# Patient Record
Sex: Male | Born: 1935
Health system: Southern US, Community
[De-identification: ages and names within clinical notes are randomized; demographics above are authoritative.]

## PROBLEM LIST (undated history)

## (undated) DIAGNOSIS — H269 Unspecified cataract: Secondary | ICD-10-CM

## (undated) DIAGNOSIS — Q059 Spina bifida, unspecified: Secondary | ICD-10-CM

## (undated) DIAGNOSIS — D649 Anemia, unspecified: Secondary | ICD-10-CM

## (undated) DIAGNOSIS — K5732 Diverticulitis of large intestine without perforation or abscess without bleeding: Secondary | ICD-10-CM

## (undated) DIAGNOSIS — I10 Essential (primary) hypertension: Secondary | ICD-10-CM

## (undated) DIAGNOSIS — E785 Hyperlipidemia, unspecified: Secondary | ICD-10-CM

## (undated) DIAGNOSIS — L03317 Cellulitis of buttock: Secondary | ICD-10-CM

## (undated) DIAGNOSIS — I779 Disorder of arteries and arterioles, unspecified: Secondary | ICD-10-CM

## (undated) DIAGNOSIS — E46 Unspecified protein-calorie malnutrition: Secondary | ICD-10-CM

## (undated) DIAGNOSIS — F32A Depression, unspecified: Secondary | ICD-10-CM

## (undated) DIAGNOSIS — I739 Peripheral vascular disease, unspecified: Secondary | ICD-10-CM

## (undated) DIAGNOSIS — F4310 Post-traumatic stress disorder, unspecified: Secondary | ICD-10-CM

## (undated) DIAGNOSIS — M545 Low back pain, unspecified: Secondary | ICD-10-CM

## (undated) DIAGNOSIS — E876 Hypokalemia: Secondary | ICD-10-CM

## (undated) DIAGNOSIS — I2510 Atherosclerotic heart disease of native coronary artery without angina pectoris: Secondary | ICD-10-CM

## (undated) DIAGNOSIS — I219 Acute myocardial infarction, unspecified: Secondary | ICD-10-CM

## (undated) DIAGNOSIS — R0602 Shortness of breath: Secondary | ICD-10-CM

## (undated) DIAGNOSIS — E119 Type 2 diabetes mellitus without complications: Secondary | ICD-10-CM

## (undated) DIAGNOSIS — Z77098 Contact with and (suspected) exposure to other hazardous, chiefly nonmedicinal, chemicals: Secondary | ICD-10-CM

## (undated) DIAGNOSIS — R32 Unspecified urinary incontinence: Secondary | ICD-10-CM

## (undated) DIAGNOSIS — F329 Major depressive disorder, single episode, unspecified: Secondary | ICD-10-CM

## (undated) DIAGNOSIS — G8929 Other chronic pain: Secondary | ICD-10-CM

## (undated) DIAGNOSIS — F419 Anxiety disorder, unspecified: Secondary | ICD-10-CM

## (undated) DIAGNOSIS — N189 Chronic kidney disease, unspecified: Secondary | ICD-10-CM

## (undated) DIAGNOSIS — M1990 Unspecified osteoarthritis, unspecified site: Secondary | ICD-10-CM

## (undated) HISTORY — PX: EYE SURGERY: SHX253

## (undated) HISTORY — DX: Spina bifida, unspecified: Q05.9

## (undated) HISTORY — PX: HEMORRHOID BANDING: SHX5850

## (undated) HISTORY — DX: Peripheral vascular disease, unspecified: I73.9

## (undated) HISTORY — DX: Diverticulitis of large intestine without perforation or abscess without bleeding: K57.32

## (undated) HISTORY — DX: Type 2 diabetes mellitus without complications: E11.9

## (undated) HISTORY — DX: Hypokalemia: E87.6

## (undated) HISTORY — DX: Anemia, unspecified: D64.9

## (undated) HISTORY — PX: PROSTATE SURGERY: SHX751

## (undated) HISTORY — DX: Chronic kidney disease, unspecified: N18.9

## (undated) HISTORY — DX: Hyperlipidemia, unspecified: E78.5

## (undated) HISTORY — DX: Post-traumatic stress disorder, unspecified: F43.10

## (undated) HISTORY — DX: Essential (primary) hypertension: I10

## (undated) HISTORY — DX: Disorder of arteries and arterioles, unspecified: I77.9

## (undated) HISTORY — DX: Unspecified protein-calorie malnutrition: E46

## (undated) HISTORY — PX: CATARACT EXTRACTION W/ INTRAOCULAR LENS  IMPLANT, BILATERAL: SHX1307

## (undated) HISTORY — DX: Unspecified cataract: H26.9

## (undated) HISTORY — DX: Atherosclerotic heart disease of native coronary artery without angina pectoris: I25.10

---
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
## 1962-02-24 HISTORY — PX: KNEE SURGERY: SHX244

## 1978-10-26 HISTORY — PX: CARPAL TUNNEL RELEASE: SHX101

## 1996-02-25 HISTORY — PX: CORONARY ARTERY BYPASS GRAFT: SHX141

## 1996-02-25 HISTORY — PX: CARDIAC CATHETERIZATION: SHX172

## 1997-11-23 ENCOUNTER — Observation Stay (HOSPITAL_COMMUNITY): Admission: RE | Admit: 1997-11-23 | Discharge: 1997-11-24 | Payer: Self-pay | Admitting: Cardiology

## 1998-10-26 HISTORY — PX: CYSTECTOMY: SUR359

## 2000-06-11 ENCOUNTER — Ambulatory Visit (HOSPITAL_COMMUNITY): Admission: RE | Admit: 2000-06-11 | Discharge: 2000-06-11 | Payer: Self-pay | Admitting: Family Medicine

## 2000-06-11 ENCOUNTER — Encounter: Payer: Self-pay | Admitting: Family Medicine

## 2000-10-19 ENCOUNTER — Encounter (INDEPENDENT_AMBULATORY_CARE_PROVIDER_SITE_OTHER): Payer: Self-pay | Admitting: Specialist

## 2000-10-19 ENCOUNTER — Inpatient Hospital Stay (HOSPITAL_COMMUNITY): Admission: RE | Admit: 2000-10-19 | Discharge: 2000-10-21 | Payer: Self-pay | Admitting: Urology

## 2001-06-14 ENCOUNTER — Encounter: Admission: RE | Admit: 2001-06-14 | Discharge: 2001-06-14 | Payer: Self-pay | Admitting: Neurology

## 2001-06-14 ENCOUNTER — Encounter: Payer: Self-pay | Admitting: Neurology

## 2001-08-30 ENCOUNTER — Encounter: Admission: RE | Admit: 2001-08-30 | Discharge: 2001-09-23 | Payer: Self-pay | Admitting: Specialist

## 2002-04-15 ENCOUNTER — Encounter (HOSPITAL_COMMUNITY): Admission: RE | Admit: 2002-04-15 | Discharge: 2002-05-15 | Payer: Self-pay | Admitting: *Deleted

## 2002-04-15 ENCOUNTER — Encounter: Payer: Self-pay | Admitting: *Deleted

## 2002-06-21 ENCOUNTER — Encounter: Payer: Self-pay | Admitting: Family Medicine

## 2002-06-21 ENCOUNTER — Ambulatory Visit (HOSPITAL_COMMUNITY): Admission: RE | Admit: 2002-06-21 | Discharge: 2002-06-21 | Payer: Self-pay | Admitting: Family Medicine

## 2002-10-06 ENCOUNTER — Emergency Department (HOSPITAL_COMMUNITY): Admission: EM | Admit: 2002-10-06 | Discharge: 2002-10-06 | Payer: Self-pay | Admitting: Emergency Medicine

## 2002-10-06 ENCOUNTER — Encounter: Payer: Self-pay | Admitting: Emergency Medicine

## 2003-01-05 ENCOUNTER — Encounter (HOSPITAL_COMMUNITY): Admission: RE | Admit: 2003-01-05 | Discharge: 2003-02-04 | Payer: Self-pay | Admitting: Family Medicine

## 2003-05-05 ENCOUNTER — Ambulatory Visit (HOSPITAL_COMMUNITY): Admission: RE | Admit: 2003-05-05 | Discharge: 2003-05-05 | Payer: Self-pay | Admitting: Internal Medicine

## 2004-02-28 ENCOUNTER — Ambulatory Visit: Payer: Self-pay | Admitting: Cardiology

## 2005-03-26 ENCOUNTER — Ambulatory Visit: Payer: Self-pay | Admitting: Cardiology

## 2005-04-11 ENCOUNTER — Ambulatory Visit: Payer: Self-pay

## 2006-02-24 DIAGNOSIS — K5732 Diverticulitis of large intestine without perforation or abscess without bleeding: Secondary | ICD-10-CM

## 2006-02-24 HISTORY — DX: Diverticulitis of large intestine without perforation or abscess without bleeding: K57.32

## 2006-03-02 ENCOUNTER — Ambulatory Visit: Payer: Self-pay | Admitting: Cardiology

## 2006-03-23 ENCOUNTER — Inpatient Hospital Stay (HOSPITAL_COMMUNITY): Admission: EM | Admit: 2006-03-23 | Discharge: 2006-03-26 | Payer: Self-pay | Admitting: Emergency Medicine

## 2006-04-16 ENCOUNTER — Ambulatory Visit: Payer: Self-pay

## 2006-10-23 ENCOUNTER — Ambulatory Visit (HOSPITAL_BASED_OUTPATIENT_CLINIC_OR_DEPARTMENT_OTHER): Admission: RE | Admit: 2006-10-23 | Discharge: 2006-10-23 | Payer: Self-pay | Admitting: Orthopedic Surgery

## 2006-10-23 ENCOUNTER — Encounter: Admission: RE | Admit: 2006-10-23 | Discharge: 2006-10-23 | Payer: Self-pay | Admitting: Orthopedic Surgery

## 2006-10-30 ENCOUNTER — Inpatient Hospital Stay (HOSPITAL_COMMUNITY): Admission: EM | Admit: 2006-10-30 | Discharge: 2006-11-11 | Payer: Self-pay | Admitting: Emergency Medicine

## 2006-11-06 ENCOUNTER — Ambulatory Visit: Payer: Self-pay | Admitting: Internal Medicine

## 2006-12-18 ENCOUNTER — Ambulatory Visit: Payer: Self-pay | Admitting: Gastroenterology

## 2006-12-21 ENCOUNTER — Ambulatory Visit (HOSPITAL_COMMUNITY): Admission: RE | Admit: 2006-12-21 | Discharge: 2006-12-21 | Payer: Self-pay | Admitting: Internal Medicine

## 2006-12-29 ENCOUNTER — Ambulatory Visit: Payer: Self-pay | Admitting: Internal Medicine

## 2007-01-06 ENCOUNTER — Encounter: Payer: Self-pay | Admitting: Internal Medicine

## 2007-01-06 ENCOUNTER — Ambulatory Visit (HOSPITAL_COMMUNITY): Admission: RE | Admit: 2007-01-06 | Discharge: 2007-01-06 | Payer: Self-pay | Admitting: Internal Medicine

## 2007-01-06 ENCOUNTER — Encounter (HOSPITAL_BASED_OUTPATIENT_CLINIC_OR_DEPARTMENT_OTHER): Admission: RE | Admit: 2007-01-06 | Discharge: 2007-03-02 | Payer: Self-pay | Admitting: Surgery

## 2007-01-06 ENCOUNTER — Ambulatory Visit: Payer: Self-pay | Admitting: Internal Medicine

## 2007-03-03 ENCOUNTER — Encounter (HOSPITAL_BASED_OUTPATIENT_CLINIC_OR_DEPARTMENT_OTHER): Admission: RE | Admit: 2007-03-03 | Discharge: 2007-06-01 | Payer: Self-pay | Admitting: Surgery

## 2007-03-09 ENCOUNTER — Ambulatory Visit: Payer: Self-pay | Admitting: Cardiology

## 2007-03-24 ENCOUNTER — Ambulatory Visit: Payer: Self-pay

## 2007-03-24 LAB — CONVERTED CEMR LAB
ALT: 15 units/L (ref 0–53)
AST: 22 units/L (ref 0–37)
Alkaline Phosphatase: 47 units/L (ref 39–117)
Bilirubin, Direct: 0.1 mg/dL (ref 0.0–0.3)
Chloride: 106 meq/L (ref 96–112)
GFR calc Af Amer: 59 mL/min
Potassium: 4 meq/L (ref 3.5–5.1)
Sodium: 139 meq/L (ref 135–145)
Total CHOL/HDL Ratio: 3.1
Total Protein: 6.4 g/dL (ref 6.0–8.3)

## 2007-06-11 ENCOUNTER — Encounter (HOSPITAL_BASED_OUTPATIENT_CLINIC_OR_DEPARTMENT_OTHER): Admission: RE | Admit: 2007-06-11 | Discharge: 2007-06-28 | Payer: Self-pay | Admitting: Surgery

## 2007-09-17 ENCOUNTER — Ambulatory Visit (HOSPITAL_BASED_OUTPATIENT_CLINIC_OR_DEPARTMENT_OTHER): Admission: RE | Admit: 2007-09-17 | Discharge: 2007-09-17 | Payer: Self-pay | Admitting: Orthopedic Surgery

## 2008-05-04 ENCOUNTER — Encounter: Payer: Self-pay | Admitting: Cardiology

## 2008-05-08 ENCOUNTER — Ambulatory Visit: Payer: Self-pay

## 2008-05-08 ENCOUNTER — Encounter: Payer: Self-pay | Admitting: Cardiology

## 2008-05-08 ENCOUNTER — Ambulatory Visit: Payer: Self-pay | Admitting: Cardiology

## 2008-08-01 ENCOUNTER — Encounter: Admission: RE | Admit: 2008-08-01 | Discharge: 2008-10-30 | Payer: Self-pay | Admitting: Nurse Practitioner

## 2008-09-21 ENCOUNTER — Encounter: Payer: Self-pay | Admitting: Cardiology

## 2008-10-05 ENCOUNTER — Encounter: Payer: Self-pay | Admitting: Cardiology

## 2008-10-27 ENCOUNTER — Encounter: Payer: Self-pay | Admitting: Cardiology

## 2008-10-29 DIAGNOSIS — I10 Essential (primary) hypertension: Secondary | ICD-10-CM | POA: Insufficient documentation

## 2008-10-29 DIAGNOSIS — E46 Unspecified protein-calorie malnutrition: Secondary | ICD-10-CM

## 2008-10-29 DIAGNOSIS — E1121 Type 2 diabetes mellitus with diabetic nephropathy: Secondary | ICD-10-CM

## 2008-10-29 DIAGNOSIS — N184 Chronic kidney disease, stage 4 (severe): Secondary | ICD-10-CM | POA: Insufficient documentation

## 2008-10-29 DIAGNOSIS — N183 Chronic kidney disease, stage 3 (moderate): Secondary | ICD-10-CM

## 2008-10-29 DIAGNOSIS — F4310 Post-traumatic stress disorder, unspecified: Secondary | ICD-10-CM

## 2008-10-29 DIAGNOSIS — E876 Hypokalemia: Secondary | ICD-10-CM

## 2008-10-29 DIAGNOSIS — D649 Anemia, unspecified: Secondary | ICD-10-CM | POA: Insufficient documentation

## 2008-10-29 DIAGNOSIS — E1122 Type 2 diabetes mellitus with diabetic chronic kidney disease: Secondary | ICD-10-CM

## 2008-10-30 IMAGING — CR DG CHEST 2V
2 series · 2 of 2 positions shown · non-contrast
Comparison: 10/23/06.

CLINICAL DATA: 75 year-old, sugar low, possible pneumonia. Current kidney infection. History of diabetes, hypertension, and bypass in 6333. Fever.
CHEST - 2 VIEW:

[w chest pa]
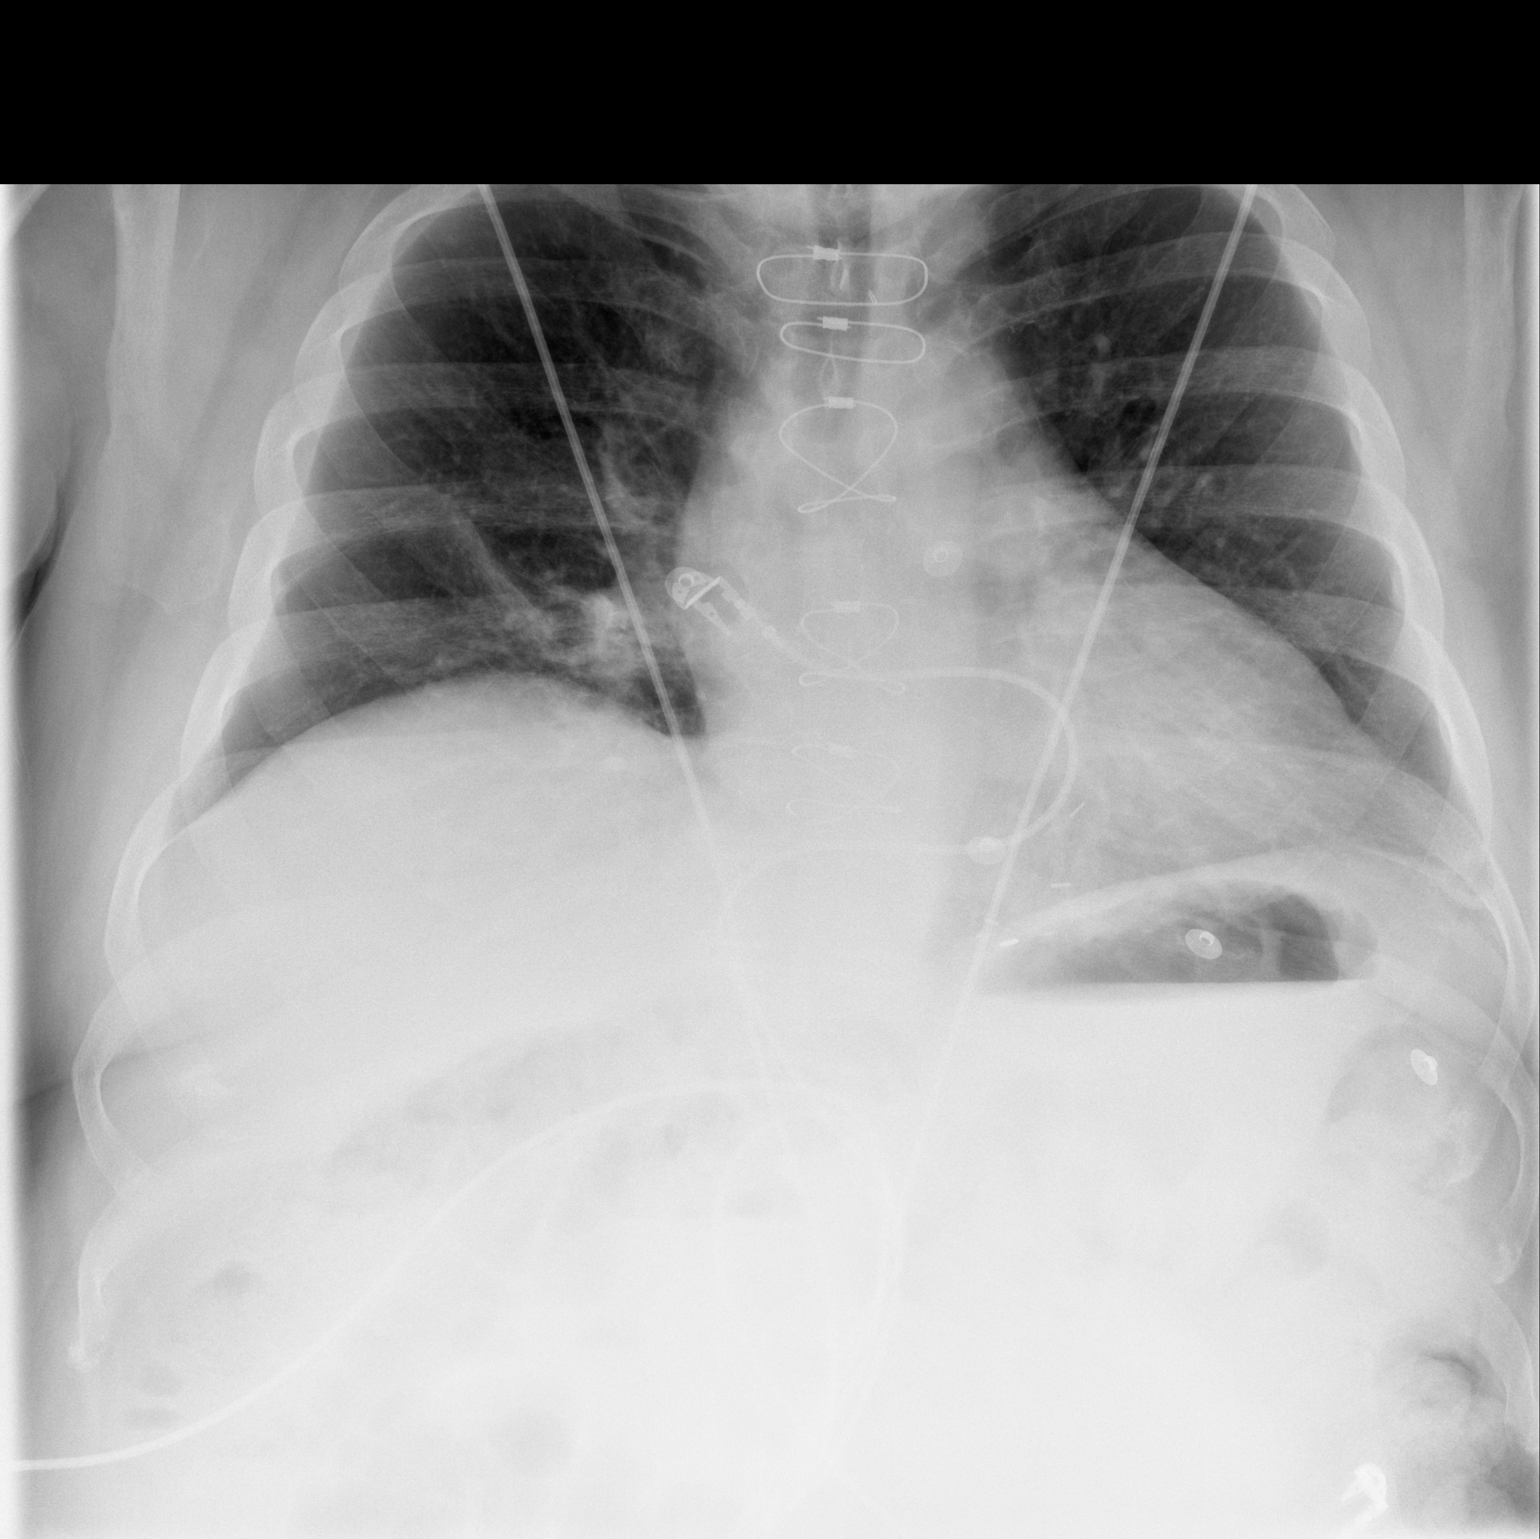

[w chest lat]
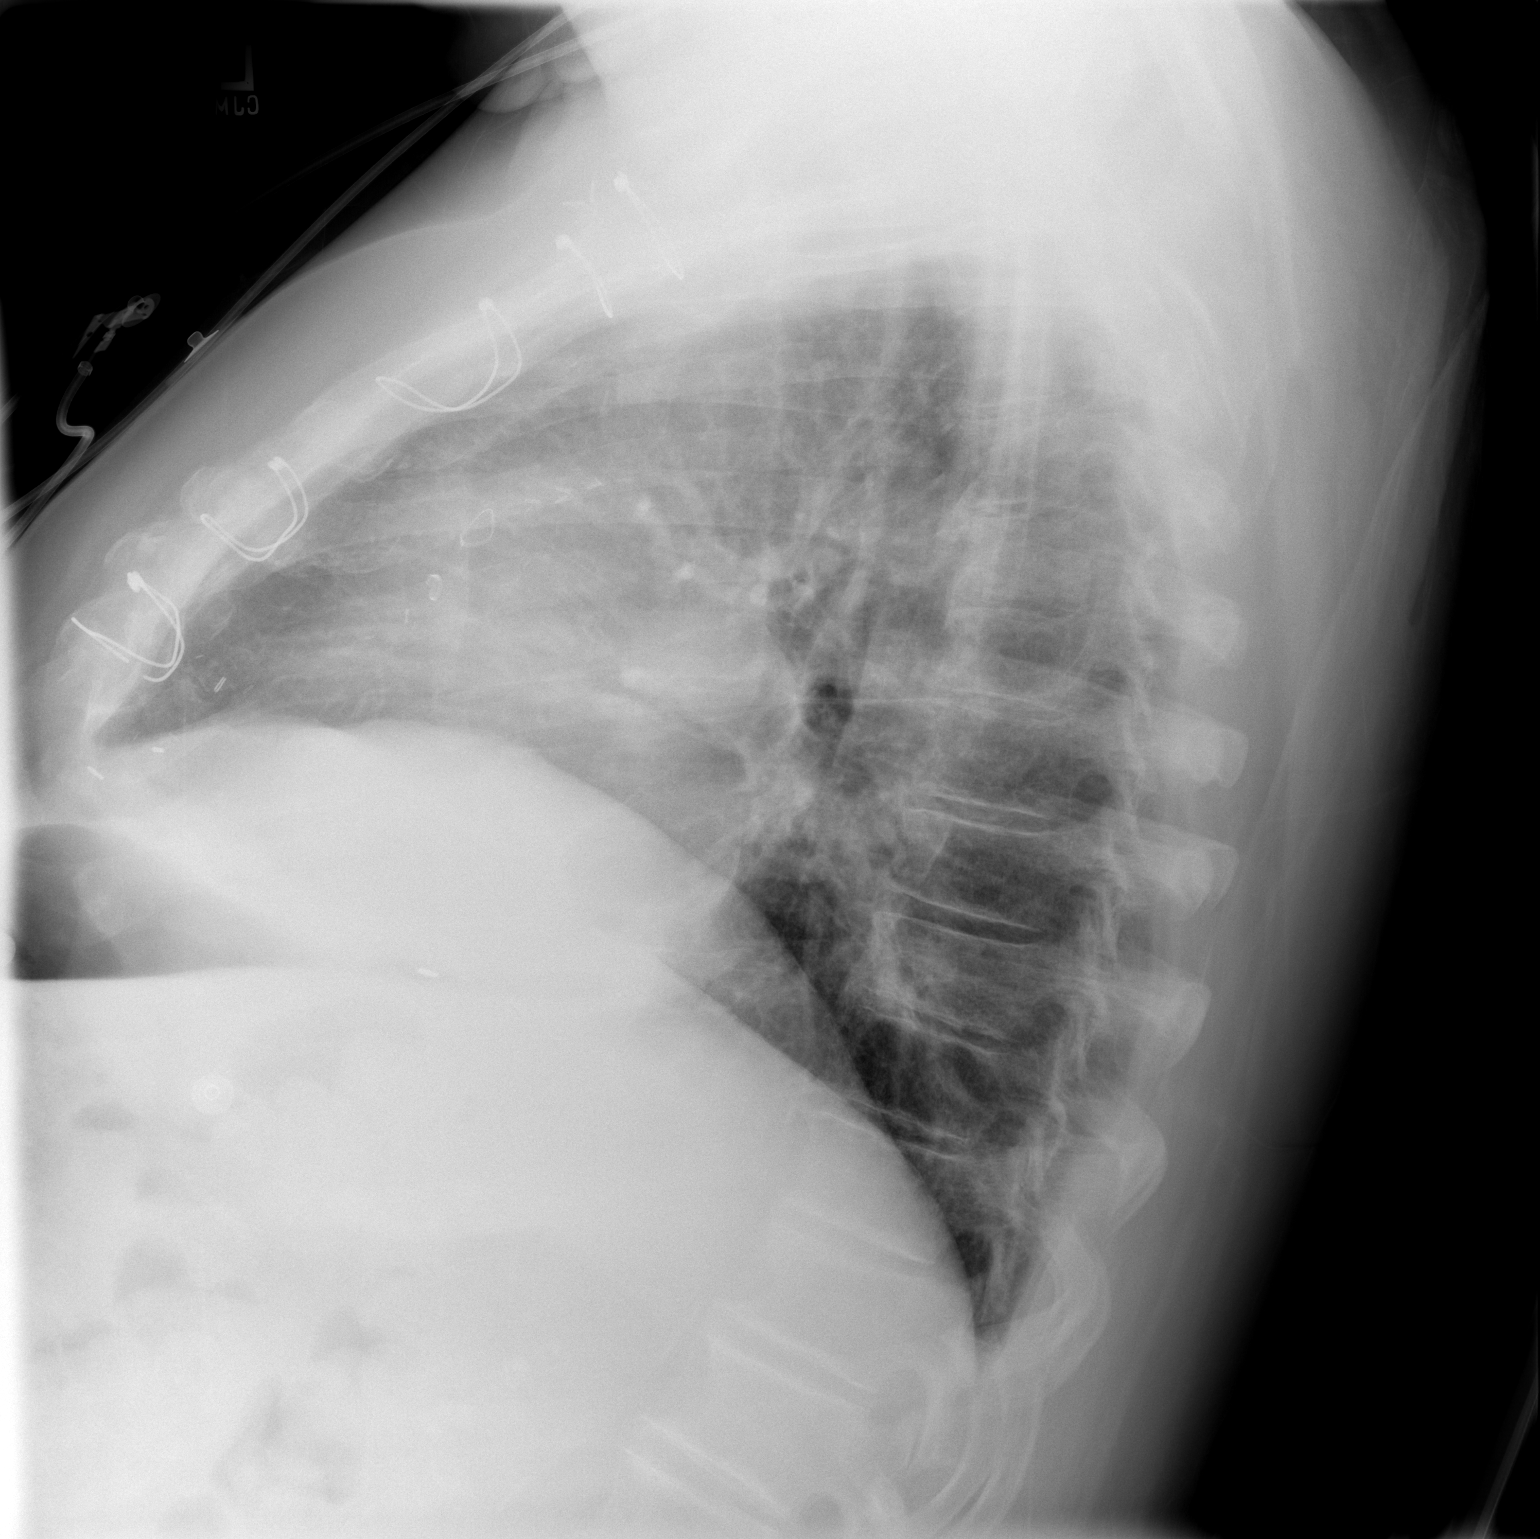

[2 of 2 positions shown; findings below may reference images not displayed]

FINDINGS: The patient has had prior median sternotomy and CABG.  The heart is mildly enlarged, but there is no pulmonary edema.  Minimal bibasilar atelectasis is noted.  Postoperative changes are seen in the region of the GE junction. No focal consolidations or pleural effusions are identified.
Cardiomegaly without evidence for acute pulmonary abnormality. A mass is seen in the right lower lobe measuring 1.9 x 2.3 cm, suspicious for bronchogenic carcinoma.  There are marked emphysematous changes bilaterally.  Postsurgical changes are seen in the left hilar region. There is a mediastinal hilar or axillary adenopathy. There is atherosclerotic calcification of the thoracic aorta, associated with significant intimal plaque.  Degenerative changes are seen in the spine.
Images of the upper abdomen are unremarkable.
IMPRESSION: 1.  There is no evidence for acute pulmonary embolus.
2.  Right lower lobe mass in the right infrahilar region.
3.  Emphysematous changes and postoperative changes left lung
Findings were called to Dr. Ni in the Emergency Department.

## 2008-11-01 ENCOUNTER — Encounter (INDEPENDENT_AMBULATORY_CARE_PROVIDER_SITE_OTHER): Payer: Self-pay | Admitting: *Deleted

## 2008-11-01 ENCOUNTER — Ambulatory Visit: Payer: Self-pay | Admitting: Cardiology

## 2008-12-22 ENCOUNTER — Encounter (INDEPENDENT_AMBULATORY_CARE_PROVIDER_SITE_OTHER): Payer: Self-pay | Admitting: *Deleted

## 2009-03-08 DIAGNOSIS — I2510 Atherosclerotic heart disease of native coronary artery without angina pectoris: Secondary | ICD-10-CM | POA: Insufficient documentation

## 2009-03-29 ENCOUNTER — Ambulatory Visit: Payer: Self-pay | Admitting: Cardiology

## 2009-04-04 ENCOUNTER — Encounter: Payer: Self-pay | Admitting: Cardiology

## 2009-05-18 ENCOUNTER — Encounter: Payer: Self-pay | Admitting: Cardiology

## 2009-05-22 ENCOUNTER — Encounter: Payer: Self-pay | Admitting: Cardiology

## 2009-05-22 ENCOUNTER — Ambulatory Visit: Payer: Self-pay

## 2009-09-10 ENCOUNTER — Encounter: Payer: Self-pay | Admitting: Cardiology

## 2009-10-31 ENCOUNTER — Ambulatory Visit: Payer: Self-pay | Admitting: Cardiology

## 2009-11-07 ENCOUNTER — Encounter: Payer: Self-pay | Admitting: Cardiology

## 2010-02-15 ENCOUNTER — Encounter: Payer: Self-pay | Admitting: Cardiology

## 2010-03-28 NOTE — Miscellaneous (Signed)
Summary: Orders Update  Clinical Lists Changes  Orders: Added new Test order of Carotid Duplex (Carotid Duplex) - Signed 

## 2010-03-28 NOTE — Assessment & Plan Note (Signed)
Summary: CAD/ANAS   Visit Type:  rov Primary Provider:  Redge Gainer  CC:  no cp..pt c/o some sob when walking..edema/ankles.  History of Present Illness: [name] returns today for followup evaluation and management his coronary disease.  I saw him recently back in the fall for preoperative clearance. They have not side operated yet.  His back is his limiting factor this point. He walks with a walker. He has not fallen since I last saw him.  He denies any rest angina or nocturnal angina. If he pushes himself extra hard on his walker he will get a little tightness in his chest. It does not radiate. When he is doing his normal pace he has no angina.  Clinical Reports Reviewed:  Nuclear Study:  04/16/2006:  Excerise capacity: Adenosine study with no exercise  Blood Pressure response:  Clinical symptoms:  ECG impression: Baseline: NSR; . No significant ST segement change with adenosine  Overall impression: Low risk stress nuclear study. There is a minimal reversible defect in the inferolateral [name] suggestive of ischemia  Glori Bickers, MD   04/11/2005:  Excerise capacity:  Blood Pressure response:  Clinical symptoms:  ECG impression: No diagnostic St or T wave changes to suggest ischemia  Overall impression: Inferolateral changes consistent with probable soft tissue attenuation (diaphragm/mycardium). Cannot exclude mild basilar ischemia. Mild apical thinning with minimal improvement, not significant for ischemia. Otherwise normal perfusion. LVEF=63%. Overall low risk scan.  Dorris Carnes, MD   Current Medications (verified): 1)  Doxycycline Hyclate 100 Mg Caps (Doxycycline Hyclate) .Marland Kitchen.. 1 Tab Once Daily 2)  Glucotrol Xl 10 Mg Xr24h-Tab (Glipizide) .Marland Kitchen.. 1 Tab Once Daily 3)  Diovan 80 Mg Tabs (Valsartan) .Marland Kitchen.. 1 Tab Qam 4)  Wellbutrin Xl 300 Mg Xr24h-Tab (Bupropion Hcl) .Marland Kitchen.. 1 Tab Qam 5)  Vytorin 10-20 Mg Tabs (Ezetimibe-Simvastatin) .Marland Kitchen.. 1 Tab At Bedtime 6)   Nitroglycerin 0.4 Mg Subl (Nitroglycerin) .... One Tablet Under Tongue Every 5 Minutes As Needed For Chest Pain---May Repeat Times Three 7)  Aspirin 81 Mg Tbec (Aspirin) .... Take One Tablet By Mouth Daily 8)  Vitamin C 1000 Mg Tabs (Ascorbic Acid) .Marland Kitchen.. 1 Tab Once Daily 9)  Vitamin D 5000 Iu .Marland Kitchen.. 1 Tab Once Daily 10)  Co Q-10 100 Mg Caps (Coenzyme Q10) .Marland Kitchen.. 1 Cap Two Times A Day 11)  Actos 45 Mg Tabs (Pioglitazone Hcl) .Marland Kitchen.. 1 Tab Once Daily 12)  Cipro 500 Mg Tabs (Ciprofloxacin Hcl) .Marland Kitchen.. 1 Tab Two Times A Day X 10 Days 13)  Lutein 40 Mg Caps (Lutein) .Marland Kitchen.. 1 Cap Once Daily 14)  Fish Oil 1000 Mg Caps (Omega-3 Fatty Acids) .Marland Kitchen.. 1 Cap Two Times A Day  Allergies: 1)  ! Sulfa 2)  ! Macrodantin  Past History:  Past Medical History: Last updated: 03/08/2009 CAD, NATIVE VESSEL (ICD-414.01) DIABETES MELLITUS, TYPE II (ICD-250.00) HYPERLIPIDEMIA (ICD-272.4) HYPERTENSION, UNSPECIFIED (ICD-401.9) RENAL INSUFFICIENCY, CHRONIC (ICD-585.9) CAROTID ARTERY DISEASE/NONOBSTRUCTIVE (ICD-433.10) ANEMIA (ICD-285.9) HYPOKALEMIA (ICD-276.8) MALNUTRITION, PROTEIN-CALORIE (ICD-263.9) AGENT ORANGE SYNDROME/ HX OF (ICD-E997.2) PTSD (ICD-309.81)    Past Surgical History: Last updated: 05/05/2008 Spina bifida surgery Left [place]ne[place] surgery CABG 1998?  Family History: Last updated: 05/05/2008 Non contributory  Social History: Last updated: 05/05/2008 Retired  Married  Tobacco Use - Yes.  Alcohol Use - no Drug Use - no  Risk Factors: Smoking Status: current (05/05/2008)  Review of Systems       nother than history of present illness  Vital Signs:  Patient profile:   77 year old male Height:  69 inches Weight:      246 pounds BMI:     36.46 Pulse rate:   93 / minute Pulse rhythm:   irregular BP sitting:   124 / 58  (left arm) Cuff size:   large  Vitals Entered By: Julaine Hua, CMA (March 29, 2009 10:05 AM)  Physical Exam  General:  obese.  obese.   Head:  normocephalic and  atraumatic Eyes:  PERRLA/EOM intact; conjunctiva and lids normal. Neck:  Neck supple, no JVD. No masses, thyromegaly or abnormal cervical nodes. Lungs:  Clear bilaterally to auscultation and percussion. Heart:  Non-displaced PMI, chest non-tender; regular rate and rhythm, S1, S2 without murmurs, rubs or gallops. Carotid upstroke normal, no bruit. Normal abdominal aortic size, no bruits. Femorals normal pulses, no bruits. Pedals normal pulses. No edema, no varicosities. Msk:  decreased ROM.   Pulses:  diminished but present the lower extremities. Extremities:  the sinus sores or ulcerations. Decrease capillary reflex.1+ left pedal edema and 1+ right pedal edema.   Neurologic:  Alert and oriented x 3. Skin:  Intact without lesions or rashes. Psych:  Normal affect.   EKG  Procedure date:  03/29/2009  Findings:      normal sinus rhythm, left axis deviation, poor R wave progression across the anterior precordium, unchanged since previous EKG.  Impression & Recommendations:  Problem # 1:  CAD, NATIVE VESSEL (ICD-414.01) Assessment Unchanged he That has stable over exertional angina. He is having no angina with the usual activity on his walker and shortened no nocturnal or rest angina. I was still clear him for surgery at a low operative risk. I will see him back in 6 months. We have added several nitroglycerin to his regimen. His updated medication list for this problem includes:    Nitroglycerin 0.4 Mg Subl (Nitroglycerin) ..... One tablet under tongue every 5 minutes as needed for chest pain---may repeat times three    Aspirin 81 Mg Tbec (Aspirin) .Marland Kitchen... Take one tablet by mouth daily  Orders: EKG w/ Interpretation (93000)  Patient Instructions: 1)  Your physician recommends that you schedule a follow-up appointment in: 6MONTHS WITH DR Ludella Pranger 2)  Your physician recommends that you continue on your current medications as directed. Please refer to the Current Medication list given to you  today. Prescriptions: NITROGLYCERIN 0.4 MG SUBL (NITROGLYCERIN) One tablet under tongue every 5 minutes as needed for chest pain---may repeat times three  #25 x 9   Entered by:   Julaine Hua, CMA   Authorized by:   Renella Cunas, MD, [facility]   Signed by:   Julaine Hua, CMA on 03/29/2009   Method used:   Electronically to        [id number]       [address]       [place], [place]  [address]       Ph: [phone] or [phone]       Fax: [phone]   RxID:   [id number]

## 2010-03-28 NOTE — Assessment & Plan Note (Signed)
Summary: 6 mo f/u ./cy   Visit Type:  6 mo f/u Primary Provider:  Redge Gainer  CC:  no cardiac complaints today.  History of Present Illness: [name] comes in today for management of his cardiac and vascular disease.  He's having no angina ischemic symptoms. His mobility is severely reduced and is on a walker.  Denies any symptoms of TIAs or mini stroke. Carotid Dopplers were stable in March.  He brings blood work from Dr. Laurance Flatten which shows his lipids to be a gold as well as hemoglobin A1c. I reviewed these numbers with him today. His creatinine is stable at 1.56.  Denies any orthopnea, PND or increased edema. No syncope or palpitations either.  Current Medications (verified): 1)  Doxycycline Hyclate 100 Mg Caps (Doxycycline Hyclate) .Marland Kitchen.. 1 Tab Once Daily 2)  Glucotrol Xl 10 Mg Xr24h-Tab (Glipizide) .Marland Kitchen.. 1 Tab Once Daily 3)  Diovan 80 Mg Tabs (Valsartan) .Marland Kitchen.. 1 Tab Qam 4)  Wellbutrin Xl 300 Mg Xr24h-Tab (Bupropion Hcl) .Marland Kitchen.. 1 Tab Qam 5)  Vytorin 10-20 Mg Tabs (Ezetimibe-Simvastatin) .Marland Kitchen.. 1 Tab At Bedtime 6)  Nitroglycerin 0.4 Mg Subl (Nitroglycerin) .... One Tablet Under Tongue Every 5 Minutes As Needed For Chest Pain---May Repeat Times Three 7)  Aspirin 81 Mg Tbec (Aspirin) .... Take One Tablet By Mouth Daily 8)  Vitamin C 1000 Mg Tabs (Ascorbic Acid) .Marland Kitchen.. 1 Tab Once Daily 9)  Vitamin D 5000 Iu .Marland Kitchen.. 1 Tab Once Daily 10)  Co Q-10 100 Mg Caps (Coenzyme Q10) .Marland Kitchen.. 1 Cap Two Times A Day 11)  Lutein 40 Mg Caps (Lutein) .Marland Kitchen.. 1 Cap Once Daily 12)  Fish Oil 1000 Mg Caps (Omega-3 Fatty Acids) .Marland Kitchen.. 1 Cap Two Times A Day  Allergies: 1)  ! Sulfa 2)  ! Macrodantin  Past History:  Past Medical History: Last updated: 03/08/2009 CAD, NATIVE VESSEL (ICD-414.01) DIABETES MELLITUS, TYPE II (ICD-250.00) HYPERLIPIDEMIA (ICD-272.4) HYPERTENSION, UNSPECIFIED (ICD-401.9) RENAL INSUFFICIENCY, CHRONIC (ICD-585.9) CAROTID ARTERY DISEASE/NONOBSTRUCTIVE (ICD-433.10) ANEMIA  (ICD-285.9) HYPOKALEMIA (ICD-276.8) MALNUTRITION, PROTEIN-CALORIE (ICD-263.9) AGENT ORANGE SYNDROME/ HX OF (ICD-E997.2) PTSD (ICD-309.81)    Past Surgical History: Last updated: 05/05/2008 Spina bifida surgery Left [place]ne[place] surgery CABG 1998?  Family History: Last updated: 05/05/2008 Non contributory  Social History: Last updated: 05/05/2008 Retired  Married  Tobacco Use - Yes.  Alcohol Use - no Drug Use - no  Risk Factors: Smoking Status: current (05/05/2008)  Review of Systems       negative other than history of present illness  Vital Signs:  Patient profile:   77 year old male Height:      69 inches Weight:      244.12 pounds BMI:     36.18 Pulse rate:   88 / minute Pulse rhythm:   regular BP sitting:   142 / 76  (left arm) Cuff size:   large  Vitals Entered By: Julaine Hua, CMA (October 31, 2009 10:24 AM)  Physical Exam  General:  obese.  obese.   Head:  normocephalic and atraumatic Eyes:  PERRLA/EOM intact; conjunctiva and lids normal. Neck:  Neck supple, no JVD. No masses, thyromegaly or abnormal cervical nodes. Lungs:  Clear bilaterally to auscultation and percussion. Heart:  PMI poorly appreciated, soft S1-S2, no gallop. Carotid upstrokes equal bilaterally with bilateral bruits left greater than right Msk:  decreased ROM.  using a walkerdecreased ROM.   Pulses:  present but reduced in the lower extremities, tissue perfusion adequate Extremities:  trace left pedal edema and trace right pedal edema.  trace left pedal edema.   Neurologic:  Alert and oriented x 3. Skin:  Intact without lesions or rashes. Psych:  Normal affect.   Impression & Recommendations:  Problem # 1:  CAD, NATIVE VESSEL (ICD-414.01) Assessment Unchanged  His updated medication list for this problem includes:    Nitroglycerin 0.4 Mg Subl (Nitroglycerin) ..... One tablet under tongue every 5 minutes as needed for chest pain---may repeat times three    Aspirin 81 Mg Tbec  (Aspirin) .Marland Kitchen... Take one tablet by mouth daily  Problem # 2:  DIABETES MELLITUS, TYPE II (ICD-250.00) Assessment: Improved  The following medications were removed from the medication list:    Actos 45 Mg Tabs (Pioglitazone hcl) .Marland Kitchen... 1 tab once daily His updated medication list for this problem includes:    Glucotrol Xl 10 Mg Xr24h-tab (Glipizide) .Marland Kitchen... 1 tab once daily    Diovan 160 Mg Tabs (Valsartan) .Marland Kitchen... Take 1 tablet daily    Aspirin 81 Mg Tbec (Aspirin) .Marland Kitchen... Take one tablet by mouth daily  Problem # 3:  HYPERLIPIDEMIA (ICD-272.4) Assessment: Improved  His updated medication list for this problem includes:    Vytorin 10-20 Mg Tabs (Ezetimibe-simvastatin) .Marland Kitchen... 1 tab at bedtime  Problem # 4:  HYPERTENSION, UNSPECIFIED (ICD-401.9)  His updated medication list for this problem includes:    Diovan 160 Mg Tabs (Valsartan) .Marland Kitchen... Take 1 tablet daily    Aspirin 81 Mg Tbec (Aspirin) .Marland Kitchen... Take one tablet by mouth daily  Problem # 5:  RENAL INSUFFICIENCY, CHRONIC (ICD-585.9) Assessment: Unchanged  Problem # 6:  CAROTID ARTERY DISEASE/NONOBSTRUCTIVE (ICD-433.10) Assessment: Unchanged  His updated medication list for this problem includes:    Aspirin 81 Mg Tbec (Aspirin) .Marland Kitchen... Take one tablet by mouth daily  Clinical Reports Reviewed:  EKG:  03/29/2009: EKG Interpretation:  normal sinus rhythm, left axis deviation, poor R wave progression across the anterior precordium, unchanged since previous EKG.  11/01/2008: EKG Interpretation:  normal sinus rhythm, left X. deviation, poor R. progression with low voltage in the anterior lateral precordium, no change  Carotid Doppler:  05/22/2009:  Impressions: Stable, mild carotid artery disease on the right. Progression of disease on the left. 0-39% RICA stenosis. A999333 LICA stenosis.  f/u 6 months.  Sherren Mocha, MD  03/24/2007:  Impression Stable carotid disease, bilaterally. 40-59% bilateral ICA stenosis  04/16/2006:   Impressions Stable carotid disease,  bilaterally 40-59% bilateral ICA stenosis  Nuclear Study:  04/16/2006:  Excerise capacity: Adenosine study with no exercise  Blood Pressure response:  Clinical symptoms:  ECG impression: Baseline: NSR; . No significant ST segement change with adenosine  Overall impression: Low risk stress nuclear study. There is a minimal reversible defect in the inferolateral wall suggestive of ischemia  Glori Bickers, MD   04/11/2005:  Excerise capacity:  Blood Pressure response:  Clinical symptoms:  ECG impression: No diagnostic St or T wave changes to suggest ischemia  Overall impression: Inferolateral changes consistent with probable soft tissue attenuation (diaphragm/mycardium). Cannot exclude mild basilar ischemia. Mild apical thinning with minimal improvement, not significant for ischemia. Otherwise normal perfusion. LVEF=63%. Overall low risk scan.  Dorris Carnes, MD   Patient Instructions: 1)  Your physician recommends that you schedule a follow-up appointment in: 6 months with Dr. Verl Blalock  ( March) 2)  Your physician has recommended you make the following change in your medication:  3)  Your physician has requested that you regularly monitor and record your blood pressure readings at home.  Please use the same machine at the same  time of day to check your readings and record them to bring to your follow-up visit. 4)  Your physician recommends that you have a bmet drawn in 2 weeks with your PCP Dr. Laurance Flatten. 5)  Your physician has requested that you have a carotid duplex in March 2012 Prescriptions: DIOVAN 160 MG TABS (VALSARTAN) Take 1 tablet daily  #90 x 3   Entered by:   Joelyn Oms RN   Authorized by:   Renella Cunas, MD, [facility]   Signed by:   Joelyn Oms RN on 10/31/2009   Method used:   Print then Give to Patient   RxID:   [id number]

## 2010-05-10 ENCOUNTER — Encounter: Payer: Self-pay | Admitting: Cardiology

## 2010-05-10 ENCOUNTER — Ambulatory Visit (INDEPENDENT_AMBULATORY_CARE_PROVIDER_SITE_OTHER): Payer: Medicare Other | Admitting: Cardiology

## 2010-05-10 ENCOUNTER — Telehealth: Payer: Self-pay | Admitting: Cardiology

## 2010-05-10 DIAGNOSIS — I2510 Atherosclerotic heart disease of native coronary artery without angina pectoris: Secondary | ICD-10-CM

## 2010-05-10 DIAGNOSIS — I10 Essential (primary) hypertension: Secondary | ICD-10-CM

## 2010-05-14 NOTE — Progress Notes (Signed)
  Phone Note Outgoing Call   Call placed by: Joelyn Oms RN,  May 10, 2010 10:27 AM Call placed to: Patient Summary of Call: refill med  Follow-up for Phone Call        [facility] [place]  They do have the  Diovan in stock.  Refill for 90/3 given Follow-up by: Joelyn Oms RN,  May 10, 2010 10:29 AM

## 2010-05-14 NOTE — Assessment & Plan Note (Signed)
Summary: FOLLOW UP - 6 MONTHS/per pt call=mj   Visit Type:  6 mo follow up Primary Provider:  Redge Gainer  CC:  feels a thump in his chest once in a while. disturbs his whole body when this happens..  History of Present Illness: Mr [name] returns today for E and M of his Cardiac and Vascular disease. He is relatively immobile but denies angina or chest pain. No sxs of TIA's and for dopplers next month. Weight has been stable. No sign edema. Has to switch from Diovan from his script program.  EKG is stable with low voltage and PRWP.  Clinical Reports Reviewed:  Carotid Doppler:  05/22/2009:  Impressions: Stable, mild carotid artery disease on the right. Progression of disease on the left. 0-39% RICA stenosis. A999333 LICA stenosis.  f/u 6 months.  [name], [name]  03/24/2007:  Impression Stable carotid disease, bilaterally. 40-59% bilateral ICA stenosis  04/16/2006:  Impressions Stable carotid disease,  bilaterally 40-59% bilateral ICA stenosis  Nuclear Study:  04/16/2006:  Excerise capacity: Adenosine study with no exercise  Blood Pressure response:  Clinical symptoms:  ECG impression: Baseline: NSR; . No significant ST segement change with adenosine  Overall impression: Low risk stress nuclear study. There is a minimal reversible defect in the inferolateral [name] suggestive of ischemia  [name], [name]   04/11/2005:  Excerise capacity:  Blood Pressure response:  Clinical symptoms:  ECG impression: No diagnostic St or T wave changes to suggest ischemia  Overall impression: Inferolateral changes consistent with probable soft tissue attenuation (diaphragm/mycardium). Cannot exclude mild basilar ischemia. Mild apical thinning with minimal improvement, not significant for ischemia. Otherwise normal perfusion. LVEF=63%. Overall low risk scan.  [name], [name]   Current Medications (verified): 1)  Doxycycline Hyclate 100 Mg Caps (Doxycycline Hyclate)  .Marland Kitchen.. 1 Tab Once Daily 2)  Glucotrol Xl 10 Mg Xr24h-Tab (Glipizide) .Marland Kitchen.. 1 Tab Once Daily 3)  Diovan 160 Mg Tabs (Valsartan) .... Take 1 Tablet Daily 4)  Wellbutrin Xl 300 Mg Xr24h-Tab (Bupropion Hcl) .Marland Kitchen.. 1 Tab Qam 5)  Vytorin 10-20 Mg Tabs (Ezetimibe-Simvastatin) .Marland Kitchen.. 1 Tab At Bedtime 6)  Nitroglycerin 0.4 Mg Subl (Nitroglycerin) .... One Tablet Under Tongue Every 5 Minutes As Needed For Chest Pain---May Repeat Times Three 7)  Aspirin 81 Mg Tbec (Aspirin) .... Take One Tablet By Mouth Daily 8)  Vitamin C 1000 Mg Tabs (Ascorbic Acid) .Marland Kitchen.. 1 Tab Once Daily 9)  Vitamin D 5000 Iu .Marland Kitchen.. 1 Tab Once Daily 10)  Co Q-10 100 Mg Caps (Coenzyme Q10) .Marland Kitchen.. 1 Cap Two Times A Day 11)  Lutein 40 Mg Caps (Lutein) .Marland Kitchen.. 1 Cap Once Daily 12)  Fish Oil 1000 Mg Caps (Omega-3 Fatty Acids) .Marland Kitchen.. 1 Cap Two Times A Day  Allergies (verified): 1)  ! Sulfa 2)  ! Macrodantin  Past History:  Past Medical History: Last updated: 03/08/2009 CAD, NATIVE VESSEL (ICD-414.01) DIABETES MELLITUS, TYPE II (ICD-250.00) HYPERLIPIDEMIA (ICD-272.4) HYPERTENSION, UNSPECIFIED (ICD-401.9) RENAL INSUFFICIENCY, CHRONIC (ICD-585.9) CAROTID ARTERY DISEASE/NONOBSTRUCTIVE (ICD-433.10) ANEMIA (ICD-285.9) HYPOKALEMIA (ICD-276.8) MALNUTRITION, PROTEIN-CALORIE (ICD-263.9) AGENT ORANGE SYNDROME/ HX OF (ICD-E997.2) PTSD (ICD-309.81)    Past Surgical History: Last updated: 05/05/2008 Spina bifida surgery Left Kne[place] surgery CABG 1998?  Family History: Last updated: 05/05/2008 Non contributory  Social History: Last updated: 05/05/2008 Retired  Married  Tobacco Use - Yes.  Alcohol Use - no Drug Use - no  Risk Factors: Smoking Status: current (05/05/2008)  Review of Systems       neg other thah HPI  Vital Signs:  Patient profile:   77 year old male Height:      69 inches Weight:      240.50 pounds BMI:     35.64 Pulse rate:   102 / minute Resp:     18 per minute BP sitting:   123 / 80  (left arm) Cuff size:    large  Vitals Entered By: Evelene Croon, CMA (May 10, 2010 10:02 AM)  Physical Exam  General:  obese.  NAD Head:  normocephalic and atraumatic Eyes:  PERRLA/EOM intact; conjunctiva and lids normal. Neck:  Neck supple, no JVD. No masses, thyromegaly or abnormal cervical nodes. Lungs:  Clear bilaterally to auscultation and percussion. Heart:  PMI nondisplaced, RRR, Nl S1 and S2, Bilat carotid bruits Msk:  decreased ROM.   Pulses:  diminished but present Extremities:  no ulcers or wounds Neurologic:  Alert and oriented x 3. Skin:  Intact without lesions or rashes. Psych:  Normal affect.   Impression & Recommendations:  Problem # 1:  CAD, NATIVE VESSEL (ICD-414.01) Assessment Unchanged  His updated medication list for this problem includes:    Nitroglycerin 0.4 Mg Subl (Nitroglycerin) ..... One tablet under tongue every 5 minutes as needed for chest pain---may repeat times three    Aspirin 81 Mg Tbec (Aspirin) .Marland Kitchen... Take one tablet by mouth daily  Orders: EKG w/ Interpretation (93000)  His updated medication list for this problem includes:    Nitroglycerin 0.4 Mg Subl (Nitroglycerin) ..... One tablet under tongue every 5 minutes as needed for chest pain---may repeat times three    Aspirin 81 Mg Tbec (Aspirin) .Marland Kitchen... Take one tablet by mouth daily  Problem # 2:  HYPERTENSION, UNSPECIFIED (ICD-401.9) Assessment: Improved  His updated medication list for this problem includes:    Diovan 160 Mg Tabs (Valsartan) .Marland Kitchen... Take 1 tablet daily    Aspirin 81 Mg Tbec (Aspirin) .Marland Kitchen... Take one tablet by mouth daily  His updated medication list for this problem includes:    Diovan 160 Mg Tabs (Valsartan) .Marland Kitchen... Take 1 tablet daily    Aspirin 81 Mg Tbec (Aspirin) .Marland Kitchen... Take one tablet by mouth daily  Problem # 3:  HYPERLIPIDEMIA (ICD-272.4)  His updated medication list for this problem includes:    Vytorin 10-20 Mg Tabs (Ezetimibe-simvastatin) .Marland Kitchen... 1 tab at bedtime  His updated  medication list for this problem includes:    Vytorin 10-20 Mg Tabs (Ezetimibe-simvastatin) .Marland Kitchen... 1 tab at bedtime  Problem # 4:  DIABETES MELLITUS, TYPE II (ICD-250.00) Assessment: Improved  His updated medication list for this problem includes:    Glucotrol Xl 10 Mg Xr24h-tab (Glipizide) .Marland Kitchen... 1 tab once daily    Diovan 160 Mg Tabs (Valsartan) .Marland Kitchen... Take 1 tablet daily    Aspirin 81 Mg Tbec (Aspirin) .Marland Kitchen... Take one tablet by mouth daily  His updated medication list for this problem includes:    Glucotrol Xl 10 Mg Xr24h-tab (Glipizide) .Marland Kitchen... 1 tab once daily    Diovan 160 Mg Tabs (Valsartan) .Marland Kitchen... Take 1 tablet daily    Aspirin 81 Mg Tbec (Aspirin) .Marland Kitchen... Take one tablet by mouth daily  Problem # 5:  CAROTID ARTERY DISEASE/NONOBSTRUCTIVE (ICD-433.10) Assessment: Unchanged [place] April 17th His updated medication list for this problem includes:    Aspirin 81 Mg Tbec (Aspirin) .Marland Kitchen... Take one tablet by mouth daily  Patient Instructions: 1)  Your physician recommends that you schedule a follow-up appointment in: 6 months  2)  Your physician recommends that you continue on your current medications as directed.  Please refer to the Current Medication list given to you today.

## 2010-06-06 ENCOUNTER — Other Ambulatory Visit: Payer: Self-pay | Admitting: Cardiology

## 2010-06-06 DIAGNOSIS — I6529 Occlusion and stenosis of unspecified carotid artery: Secondary | ICD-10-CM

## 2010-06-07 ENCOUNTER — Encounter (INDEPENDENT_AMBULATORY_CARE_PROVIDER_SITE_OTHER): Payer: Medicare Other | Admitting: *Deleted

## 2010-06-07 DIAGNOSIS — I6529 Occlusion and stenosis of unspecified carotid artery: Secondary | ICD-10-CM

## 2010-06-14 ENCOUNTER — Encounter: Payer: Self-pay | Admitting: Cardiology

## 2010-07-08 ENCOUNTER — Encounter: Payer: Self-pay | Admitting: Cardiology

## 2010-07-09 NOTE — Assessment & Plan Note (Signed)
[facility]                            CARDIOLOGY OFFICE NOTE   NAME:[name]                      MRN:          [id number]  DATE:05/08/2008                            DOB:          1930/09/12    [name] comes in today for followup.  Unfortunately, he has gained  about 6 pounds.  He is having no angina or ischemic symptoms, though he  does walk with a cane now.  He has fallen 5 times in the last year with  no major injury.   On his last visit, I was concerned about his blood pressure, his  creatinine, and he is status post bypass grafts.   His blood work was fairly stable with a creatinine of around 1.6-1.8.  He has had further blood work done by Dr. Laurance Flatten which has shown  stability over the past year.   A stress Myoview on March 24, 2007, showed an EF of 65% with minimal  reversible defect in inferolateral wall.  Carotid Doppler showed  nonobstructive plaque bilaterally in the internal carotids, antegrade  flow in both vertebrals.   MEDICATIONS:  1. Doxycycline 1 tab once a day.  2. Actos 45 mg per day.  3. Glucotrol XL 10 mg p.o. daily.  4. Diovan 80 mg p.o. daily.  5. Wellbutrin XL 300 mg per day.  6. Vytorin 10/20 at bedtime.  7. Nitroglycerin p.r.n.   PHYSICAL EXAMINATION:  VITAL SIGNS:  His blood pressure is 140/70, his  pulse is 84 and regular, his height is 69 inches, 261 pounds.  BMI is  38.68.  HEENT:  Other than some weight in his face is unchanged.  NECK:  Supple.  Carotid upstrokes were equal bilaterally without bruits.  Thyroid is not enlarged.  Trachea is midline.  CHEST:  Lungs are clear to auscultation and percussion.  HEART:  Poorly-appreciated PMI.  Normal S1 and S2.  Sternotomy site is  stable.  ABDOMEN:  Protuberant, good bowel sounds.  Organomegaly was difficult to  assess.  EXTREMITIES:  No significant edema.  Pulses were present.  NEURO:  Intact except for problem with his gait.  He uses a cane.  He  is  still sufficient, however.  SKIN:  Unremarkable.   ASSESSMENT AND PLAN:  [name] is stable from my standpoint.  I have  made no changes in his medical program.  We will plan on seeing him back  again in a year.  I will see in January 2011.     Thomas C. Verl Blalock, MD, [facility]  Electronically Signed    TCW/MedQ  DD: 05/08/2008  DT: 05/09/2008  Job #: [id number]   cc:   Chipper Herb, M.D.

## 2010-07-09 NOTE — Consult Note (Signed)
NAME:  [name], [name]               ACCOUNT NO.:  [id number]   MEDICAL RECORD NO.:  [id number]          PATIENT TYPE:  REC   LOCATION:  FOOT                         FACILITY:  [facility]   PHYSICIAN:  Epifania Gore. Nils Pyle, M.D.DATE OF BIRTH:  1930/07/23   DATE OF CONSULTATION:  01/07/2007  DATE OF DISCHARGE:                                 CONSULTATION   REASON FOR CONSULTATION:  [name] is a 77 year old man referred by  Dr. Laurance Flatten from [place] for evaluation of nonhealing ulcers on the  buttock area.   IMPRESSION:  Cutaneous candidiasis.   RECOMMENDATIONS:  We have instructed the patient in the preparation of a  0.25% Dakin solution which he will use as a rinse.  He will initiate  antiseptic soap washing of the area b.i.d. with thorough drying rinsing  with the Dakin's solution.  Thereafter, he will use over-the-counter  Tinactin powder to keep this area dry.  He will continue to wear cotton  briefs.  We will re-evaluate him in 2 weeks.   HISTORY OF PRESENT ILLNESS:  [name] is a 77 year old, diabetic who  recently underwent percutaneous drainage of an abdominal abscess and was  treated with antibiotics.  During the course of his treatment, he noted  areas of erythema and drainage which were not particularly painful, but  were more of a aggravating discomfort.  These areas were treated with  topical preparations with some improvement.  There had been no  malodorous drainage.  He has had no fever.  His blood sugars have been  under very good control.   In spite of his local management, these areas continued to itch and  soiled his undergarment.  He was therefore referred to the wound center.   PAST MEDICAL HISTORY:  1. Hypertension.  2. Post-traumatic stress syndrome.  3. Diabetic x12 years.   ALLERGIES:  SULFA DRUGS AND MACRODANTIN which causes him to have an  intense rash.   CURRENT MEDICATIONS:  1. Actos 45 mg daily.  2. Glipizide XL 10 mg daily.  3. Diovan 80 mg daily.  4. Wellbutrin 300 mg daily.  5. Aspirin 81 mg daily.  6. Doxycycline 100 mg b.i.d.  7. Bacitracin ointment topically.   PAST SURGICAL HISTORY:  1. Exploration of the left knee in 1965.  2. He had spina bifida surgery in 1934.  3. Quadruple bypass in 1998.  4. Abscess percutaneous drainage in 2008.  5. Transurethral prostatic resection approximately 5 years ago.   FAMILY HISTORY:  Positive for hypertension, diabetes and heart attack.   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  He lives in [place].  He has four adult children who live in  the local area.  He did have exposure to agent orange.   REVIEW OF SYSTEMS:  He is a nonsmoker.  He denies chronic cough, visual  changes or speech impediments.  He has no stigmata of TIAs.  He denies  chest pain.  He has had some urinary obstructive symptoms which were  taken care of which were managed by Dr. Karsten Ro.  His  weight has been  stable.  He has occasional constipation and, in fact, has to use  frequent laxatives.  There has been no hematochezia or hematemesis.  The  remainder of review of systems is negative.   PHYSICAL EXAMINATION:  GENERAL:  He is an alert, pleasant man in no  acute distress.  VITAL SIGNS:  Blood pressure is 148/77, respirations 16, pulse rate 93,  temperature is 97.9.  Capillary blood glucose is 217 mg percent after  his breakfast.  HEENT:  Clear.  NECK:  Supple.  Trachea is midline.  Thyroid is nonpalpable.  LUNGS:  Clear.  HEART:  Heart sounds were normal.  ABDOMEN:  Soft.  There is no evidence of inflammatory lesions or  tenderness.  EXTREMITIES:  Remarkable for bilateral warm extremities with +3 dorsalis  pedis pulses.  The patient retains protective sensation.  On the buttock  area, on the medial aspects of both buttocks, are superficial  ulcerations extending into the dermis.  There is moisture and hyperemia  consistent with previous application of bacitracin.   These areas are  mildly tender to palpation.  There is no abscess.  The postsurgical  changes from spina bifida surgery are evident.   DISCUSSION:  The patient's cutaneous manifestation history of previous  antibiotic therapy and the character of this wound all are consistent  with cutaneous candidiasis.  We will treat this patient empirically and  re-evaluate him in 2 weeks.  We anticipate that these lesions should  resolve.  We have counseled the patient toward these ends.  We have  given him an opportunity to ask questions.  He seems to understand.  He  expresses gratitude for having been seen in the clinic and indicates  that he will be compliant.  If he has any difficulty or if he notices  that his drainage is becoming more or if he is having fever or  difficulty controlling his blood sugars, he will contact the clinic for  an interim re-evaluation.      Harold A. Nils Pyle, M.D.  Electronically Signed     HAN/MEDQ  D:  01/07/2007  T:  01/08/2007  Job:  [id number]   cc:   Chipper Herb, M.D.

## 2010-07-09 NOTE — Assessment & Plan Note (Signed)
[facility]   NAME:  [name], [name] NO.:  [id number]   MEDICAL RECORD NO.:  [id number]      DATE OF BIRTH:  [birth year]   PHYSICIAN:  Epifania Gore. Nils Pyle, M.D. VISIT DATE:  03/22/2007                                   OFFICE VISIT   SUBJECTIVE:  [name] is a 77 year old man who we have followed for  cutaneous mycosis candidiasis.  In the interim we have treated him with  Dakin's wash followed by blot drying and Niaspan ointment with over-the-  counter Tinactin cream to control moisture.  He reports that there is no  itching and there is no weeping.  He continues to be ambulatory.   OBJECTIVE:  VITALS:  Blood pressure 147/69, respirations 18, pulse rate  84, temperature is 98.3, capillary blood glucose is 89 mg percent.  SKIN:  Inspection of the left buttock area shows that this wound has  continued to contract.  There is clean granulation with advancing  epithelium.  There is decrease in hyperemia.  There is no desquamation  whatsoever and no satellite injuries.   ASSESSMENT:  Improvement.   PLAN:  We will continue the above therapy and reevaluate the patient in  2 weeks.      Harold A. Nils Pyle, M.D.  Electronically Signed     HAN/MEDQ  D:  03/22/2007  T:  03/22/2007  Job:  [id number]

## 2010-07-09 NOTE — Discharge Summary (Signed)
NAME:  [name], [name]               ACCOUNT NO.:  [id number]   MEDICAL RECORD NO.:  [id number]          PATIENT TYPE:  INP   LOCATION:  5502                         FACILITY:  [facility]   PHYSICIAN:  Barbette Merino, M.D.      DATE OF BIRTH:  1930/04/03   DATE OF ADMISSION:  10/30/2006  DATE OF DISCHARGE:                               DISCHARGE SUMMARY   PRIMARY CARE PHYSICIAN:  The patient is unassigned to [place]; he goes to Dr.  Redge Gainer.   DISCHARGE DIAGNOSES:  1. Appendiceal abscess.  2. Diabetes type 2.  3. Chronic anemia.  4. Hypokalemia.  5. Chronic kidney disease class III.  6. Protein calorie malnutrition.  7. Obesity.   DISCHARGE MEDICATIONS:  To be dictated at the time of discharge.   DISPOSITION:  Will also be determined at time of discharge.   PROCEDURES PERFORMED THIS ADMISSION:  1. Chest X-Ray on September 4 that showed no acute pulmonary findings,      right lower lobe mass in the right infrahilar region, emphysematous      changes and postoperative changes of the left lung.  2. CT of abdomen and pelvis on the same day on September 6 shows small      contained gas collection with inflammation at the base of the      cecum; differential includes cecal diverticular perforation versus      appendiceal abscess, but no acute pelvic findings.  3.  CT of chest      without contrast on September 8 showed no acute process or evidence      of right lower lobe mass, foci of atelectasis with probably area of      scar in the right upper lobe at approximately 6 mm, prominent      thoracic lymph nodes, likely reactive small bilateral pleural      effusions.  3. Repeat CT of abdomen and pelvis on September 6 showed slight      enlargement of the inflammatory collection in the right abdomen      adjacent to the cecum and terminal ileum consistent with an abscess      collection related to either cecal diverticulitis or perforated      appendicitis, also stable partially calcified  nodule along the      anterior abdominal cavity, small pleural effusions, distention of      the urinary bladder and probable urinary bladder diverticula in the      pelvis.  4. CT-guided drainage of this abscess on November 07, 2006 with a      drain still in place.   CONSULTATIONS:  1. Dr. Zella Richer, General Surgery.  2. Interventional radiologist, Dr. Barbie Banner.   BRIEF HISTORY AND PHYSICAL:  Please refer to dictated history and  physical on admission by Dr. Maryjean Ka.  In short, however, the  patient is a 77 year old gentleman that came in secondary to wheezing  and respiratory difficulties.  While in the hospital, he started  complaining of abdominal pain and further studies were performed that  indicated the findings above.  He was also found  to be in acute renal  failure on admission with a creatinine 2.7, sodium 131, potassium 4.7.  He was obstipated and had evidence of acute bronchitis.   HOSPITAL COURSE:  PROBLEM #1 - APPENDICEAL ABSCESS:  This was later  discovered as indicated above from the CT.  Currently, the patient has  been on vancomycin, Zosyn and even Flagyl, which we discontinued today.  He has remained afebrile.  He has a drain now that is draining very  little material from his appendiceal abscess.  Further treatment will be  per Surgery.  Laparotomy was contemplated prior to discovery of the  abscess and he will continue antibiotics as of now.  No cultures are  currently back, indicating the source of his infection.   PROBLEM #2 - DIABETES.  His blood sugars have been controlled on the  current regimen in the hospital.   PROBLEM #3 - CHRONIC ANEMIA:  His hemoglobin is currently stable at  around 9.5.   PROBLEM #4 - HYPOKALEMIA:  Transient hypokalemia process and that has  been repleted.   PROBLEM #5 - CHRONIC KIDNEY DISEASE.  The patient had acute on chronic  renal failure on admission.  His BUN and  creatinine have however  improved.  Currently, his  creatinine is down to 1.18.   PROBLEM #6 - PROTEIN CALORIE MALNUTRITION DUE TO POOR ORAL INTAKE:  Albumin is 1.8 now.  Once his p.o. intake status improves, will continue  to adjust.  The patient may require TNA at some point, which was  contemplated, but currently he is eating and does not require that.   For further addendum to dictation discharge summary will be dictated at  the time of discharge.      Barbette Merino, M.D.  Electronically Signed     LG/MEDQ  D:  11/08/2006  T:  11/09/2006  Job:  [id number]

## 2010-07-09 NOTE — Op Note (Signed)
[name], [name]               ACCOUNT NO.:  [id number]   MEDICAL RECORD NO.:  [id number]          PATIENT TYPE:  AMB   LOCATION:  [place]                          FACILITY:  [facility]   PHYSICIAN:  Youlanda Mighty. Sypher, M.D. DATE OF BIRTH:  1930/06/12   DATE OF PROCEDURE:  09/17/2007  DATE OF DISCHARGE:                               OPERATIVE REPORT   PREOPERATIVE DIAGNOSIS:  Chronic entrapped neuropathy, right median  nerve and carpal tunnel.   POSTOPERATIVE DIAGNOSIS:  Chronic entrapped neuropathy, right median  nerve and carpal tunnel.   OPERATIONS:  Release of right transcarpal ligament.   OPERATIONS:  Youlanda Mighty. Sypher, MD   ASSISTANT:  Marily Lente Dasnoit, PA   ANESTHESIA:  IV regional proximal brachial level utilizing 2 standard  tourniquets.   SUPERVISING ANESTHESIOLOGIST:  Soledad Gerlach, MD   INDICATIONS:  Brevon Siqueira is a 77 year old gentleman referred through  the courtesy of Dr. Redge Gainer of [place]  for treatment of bilateral hand numbness and discomfort.   Mr. Schadler has a host of complex medical problems including chronic type  2 diabetes, chronic anemia, chronic renal insufficiency class III,  obesity, and peripheral vascular disease.   He has progressive hand numbness.  Clinical examination revealed signs  of severe bilateral carpal tunnel syndrome, and electrodiagnostic  studies confirmed very severe bilateral carpal tunnel syndrome.   After informed consent, he was brought to the operating room at this  time.   Given his constellation of medical problems, we were a bit perplexed as  to optimum anesthesia.   Given his vascular disease, it will be challenging to perform surgery  under IV regional block, however, with his list of medical problems,  general anesthesia was not deemed an acceptable choice.   After detailed informed consent, he was brought to the operating room at  this time anticipating IV regional anesthesia  utilizing 2 tourniquets in  an effort to adequately occlude his brachial artery.   PROCEDURE NOTE:  BAMIDELE VILLASENOR was brought to the operating room and  placed in supine position on the operating table.   Following an anesthesia consult with Dr. Ola Spurr, he was brought to  room #1, placed in supine position on the operating table, and an IV  regional block was placed with 2 standard tourniquets.   After 11 minutes, excellent anesthesia of the right arm was achieved.   The arm was prepped with Betadine soap solution and sterilely draped  during the latent period after placement of the IV lidocaine.  When  anesthesia was noted to be satisfactory, we proceeded directly to carpal  tunnel release surgery.   A 3-cm incision was fashioned in the line of the ring finger and the  palm.  Subcutaneous tissues were carefully divided revealing palmar  fascia.  This was split longitudinally to reveal the common sensory  branch of the median nerve.  These were followed back to the transverse  carpal ligament which was then carefully separated from median nerve by  use of a Insurance account manager.  After exposure superficially and deep of  the  transcarpal ligament, the ligament was released with scissors along  its ulnar border extending into the distal forearm.   This widely opened the carpal canal.  The contents of the canal were  under significant pressure.  The tenosynovium bulged, and the nerve was  noted be extremely hyperemic.   The wound was inspected for bleeding points which were electrocauterized  with bipolar current followed by repair of the skin with intradermal 3-0  Prolene suture.   A compressive dressing was applied with a volar plaster splint  maintaining the wrist in 5 degrees of dorsiflexion.  There were no  apparent complications.   For aftercare, Mr. Delon is provided a prescription for Percocet 5 mg 1  p.o. q.4-6 h. p.r.n. pain.  He will return to see me for followup  in the  office in 1 week.      Youlanda Mighty Sypher, M.D.  Electronically Signed     RVS/MEDQ  D:  09/17/2007  T:  09/17/2007  Job:  [id number]   cc:   Chipper Herb, M.D.

## 2010-07-09 NOTE — Assessment & Plan Note (Signed)
[facility]   NAME:  LEEAM, SANDAU NO.:  [id number]   MEDICAL RECORD NO.:  [id number]      DATE OF BIRTH:  10/01/1930   PHYSICIAN:  Epifania Gore. Nils Pyle, M.D. VISIT DATE:  01/20/2007                                   OFFICE VISIT   SUBJECTIVE:  Mr. Seepersad is a 77 year old man who we have treated for  cutaneous mycosis/suspected candidiasis involving the sacral area.  He  has been treated with a modified Dakin's solution and antiseptic soap,  thorough drying and over-the-counter Tinactin.  He reports that there is  less the itching and less pain.  He is also complaining of a red and  tender area at the base of the nail of the 3rd finger.  There has been  no interim fever or malodor.   OBJECTIVE:  Blood pressure is 153/74, respirations 18, pulse rate 83,  temperature 91, capillary blood glucose is 139 mg% .  Inspection of the  buttock area shows that both wounds appears to be improved with less  hyperemia and no drainage, no malodor and 100% granulation of wound #1.  On inspection of the right 3rd finger/digit, there is an early hyperemia  at the nail base consistent with a forming paronychia.  There is no  fluctuance at this time and an I&D is not indicated.   ASSESSMENT:  1. Clinical improvement of cutaneous mycosis with Dakin's solution.  2. Early paronychia of the 3rd digit of the right hand.   PLAN:  We have instructed the patient to use a moist soak of the finger,  15 minutes twice a day, and to observe this area of suspected  development of a paronychia.  If this area becomes tender or fluctuant,  he will have to be seen in the emergency room and will likely require an  I&D; in the interim, he will continue on his doxycycline.   With regard to the cutaneous mycosis, he should continue the use of the  Dakin's solution and the over-the-counter Tinactin.  We will reevaluate  the patient on Tuesday, December 2.      Harold A.  Nils Pyle, M.D.  Electronically Signed     HAN/MEDQ  D:  01/20/2007  T:  01/20/2007  Job:  [id number]

## 2010-07-09 NOTE — Op Note (Signed)
NAME:  [name], [name]               ACCOUNT NO.:  [id number]   MEDICAL RECORD NO.:  [id number]          PATIENT TYPE:  AMB   LOCATION:  DAY                           FACILITY:  [facility]   PHYSICIAN:  R. Garfield Cornea, M.D. DATE OF BIRTH:  21-Oct-1930   DATE OF PROCEDURE:  01/06/2007  DATE OF DISCHARGE:                               OPERATIVE REPORT   PROCEDURE:  Diagnostic colonoscopy with biopsy.   INDICATIONS FOR PROCEDURE:  A 77 year old gentleman with a recent  cecal/periappendiceal abscess.  This was cared for by Dr. Jackolyn Confer down in [place].  Subsequent CT demonstrated marked  improvement in __________ changes.  He was referred to me by Dr.  Zella Richer for colonoscopy to get a good look at his cecum to make sure  he is not harboring an occult cecal carcinoma.  This approach has been  discussed with the patient at length.  Potential risks, benefits and  alternatives have been reviewed and questions answered.  He is  agreeable.  Please see documentation in medical record.   PROCEDURE NOTE:  O2 saturation, blood pressure, pulse and respirations  were monitored throughout the entire procedure.   CONSCIOUS SEDATION:  Versed 5 mg IV and Demerol 75 mg IV in divided  doses.   INSTRUMENT:  Pentax video chip system.   FINDINGS:  Digital rectal exam revealed no abnormalities aside from  minimal internal hemorrhoids.   ENDOSCOPIC FINDINGS:  The prep was adequate.  Colon:  Colonic mucosa was  surveyed from the rectosigmoid junction through the left, transverse,  and right colon, to the appendiceal orifice, ileocecal valve and cecum.  These structures were ultimately well seen and photographed for the  record.  From this level, the scope was slowly withdrawn.  All  previously mentioned mucosal surfaces were again seen.  I attempted to  intubate the terminal ileum but was unable to do so.   The patient was noted have a long, tortuous, capacious colon.  It took  quite a while to  make it to the cecum and had to employ a number of  maneuvers including external abdominal pressure and changing of the  patient's position to get there.  There was some difficulty in  compliance at the cecum.  It took quite a bit of insufflation for it to  open up.  Ultimately it was well seen.  The appendiceal orifice,  ileocecal valve and cecum was thoroughly surveyed and there was no  evidence of neoplasm.  The scope was slowly withdrawn.  All previously  mentioned mucosal surfaces were again seen.  The patient had a  diminutive polyp at the hepatic flexure which was cold biopsied/removed.   I only identified a single diverticula in this man's colon.  It was in  the descending segment.  The remainder of the colonic mucosa appeared  normal.  The scope was pulled down the rectum where thorough examination  of the rectal mucosa including retroflexed view of anal verge  demonstrated no abnormalities.  This patient tolerated the lengthy  procedure very well was __________   IMPRESSION:  Minimal internal hemorrhoids, otherwise  normal.  Single  diverticulum mid descending colon.  Long, capacious, redundant colon.  Normal-appearing cecum, ileocecal valve/appendiceal orifice.   I suspect the patient may have perforated his appendix as the primary  cause of his abscess.  Clinically, he is doing well.   RECOMMENDATIONS:  Will follow up on the pathology from the polyp removed  today and will make further recommendations in the very near future.      Bridgette Habermann, M.D.  Electronically Signed     RMR/MEDQ  D:  01/06/2007  T:  01/07/2007  Job:  [id number]   cc:   Chipper Herb, M.D.  Fax: [phone]   Odis Hollingshead, M.D.  [address]  [place]  [place] [address]   Hosie Spangle, M.D.  Fax: [phone]

## 2010-07-09 NOTE — H&P (Signed)
NAME:  [name], [name]               ACCOUNT NO.:  [id number]   MEDICAL RECORD NO.:  [id number]          PATIENT TYPE:  AMB   LOCATION:  DAY                           FACILITY:  [facility]   PHYSICIAN:  R. Garfield Cornea, M.D. DATE OF BIRTH:  [birth year]   DATE OF ADMISSION:  DATE OF DISCHARGE:  LH                              HISTORY & PHYSICAL   CHIEF COMPLAINT:  Needs colonoscopy.   HISTORY OF PRESENT ILLNESS:  [name] is a 77 year old Caucasian male.  He was admitted to [facility] with a right lower  quadrant inflammatory process and abscess and underwent percutaneous  drainage and IV antibiotics.  He is felt to have either a retrocecal  diverticulum with abscess versus appendiceal abscess versus carcinoma.  He has been seen by Dr. Zella Richer prior to surgery.  He is going to  have colonoscopy to rule out carcinoma.  He was seen in the office and  scheduled for CT.  CT was performed on December 21, 2006.  It showed  minimal residual appendiceal wall thickening with surrounding  inflammatory changes, a small appendicolith at the base of appendix.  He  has a hemoglobin of 12.6, hematocrit 37.8, white blood cell count of 7.8  and normal platelet count and creatinine.  Dr. Gala Romney has reviewed CT  films and feels it is safe to proceed with colonoscopy at this time.   When he was examined in my office on December 18, 2006, he was found to  have cellulitis of both buttocks, as well as the beginning of decubitus  ulcers and was started on Levaquin.  He has completed this antibiotic  and has been using bacitracin.  He states that things have improved  greatly.   PAST MEDICAL AND SURGICAL HISTORY:  1. Recent hospitalization as described the HPI.  2. Hypercholesterolemia.  3. Hypertension.  4. Diabetes mellitus.  5. PTSD.  6. Chronic kidney disease.  7. History of Agent Orange exposure.  8. He has had back surgery for spina bifida.  9. Left knee surgery.  10.Coronary artery  bypass graft.  11.Colonoscopy by Dr. Gala Romney on May 05, 2003 which showed internal      hemorrhoids __________ , but otherwise normal colon.  12.Seasonal allergies.  13.Hypercholesterolemia.  14.Frequent urinary tract infections.   CURRENT MEDICATIONS:  1. DiaBeta 80 mg daily.  2. Actos 4 grams daily.  3. Vytorin 10/20 mg daily.  4. Glucotrol XL 10 mg daily.  5. Wellbutrin 300 mg daily.  6. Coenzyme Q10 once daily.  7. Lutein 40 mg daily.  8. Fish oil once daily.  9. Vitamin C 1 gram daily.  10.Multivitamin daily.  11.Vitamin D 2 grams daily.  12.Aspirin 81 mg daily.  13.Folic acid [id number] mcg daily.  14.Osteo Bi-Flex b.i.d.   ALLERGIES:  1. SULFA.  2. MACRODANTIN.   FAMILY HISTORY:  There is no known family history of colon carcinoma or  chronic GI problems.   SOCIAL HISTORY:  Mr. Lenning is married.  He has four healthy children.  He is retired Company secretary.  He has a remote history to tobacco use.  Denies alcohol or drug use.   REVIEW OF SYSTEMS:  See HPI.   PHYSICAL EXAMINATION:  VITAL SIGNS:  Weight 246 pounds, height  __________  inches, temperature 98 degrees, blood pressure 130/68 and  pulse 80.  GENERAL:  [name] is an elderly Caucasian male who is alert,  oriented, pleasant, cooperative, in no acute distress.  HEENT:  Sclerae clear, nonicteric.  Conjunctivae pink.  Oropharynx pink  and moist without any lesions.  NECK:  Supple without thyromegaly.  CHEST:  Heart regular and rhythm.  LUNGS:  Clear to auscultation bilaterally.  ABDOMEN:  Positive bowel sounds x4.  No bruits auscultated.  Soft,  mildly distended.  There is no tenderness, no rebound tenderness or  guarding.  No hepatosplenomegaly or mass.  Exam is limited due to  patient's body habitus.  RECTAL:  No internal exam was performed, but he does have much-improved  excoriation and irritation, as well as erythematous demarcated  ulceration to bilateral buttocks.   IMPRESSION:  [name] is a  77 year old Caucasian male with recent  right lower quadrant abscess which has responded to IV antibiotics.  Differentials include ruptured diverticula with abscess versus  appendiceal abscess versus carcinoma, and he will therefore undergo  colonoscopy for further evaluation.  He is followed by Dr. Zella Richer,  as well.  He has been treated for cellulitis to bilateral, and I am  quite concerned that he is developing decubiti, given his recent  immobility and most likely hypoalbuminemia.   PLAN:  1. I would like him to be seen this week by Dr. Laurance Flatten so that he can      have specialized wound care treatment to his decubiti.  2. He is to be scheduled for colonoscopy with Dr. Gala Romney in the near      future.  I discussed the procedure including      the risks and benefits to include, but are not limited to,      bleeding, infection, perforation, drug reaction.  He agrees to this      plan, and consent will be obtained.  3. Further recommendations pending colonoscopy.      Vickey Huger, N.P.      Bridgette Habermann, M.D.  Electronically Signed    KJ/MEDQ  D:  12/30/2006  T:  12/31/2006  Job:  [id number]   cc:   Chipper Herb, M.D.  Fax: [phone]   Odis Hollingshead, M.D.  [address], [place]  [place] [id number]

## 2010-07-09 NOTE — Assessment & Plan Note (Signed)
[facility]                            CARDIOLOGY OFFICE NOTE   NAME:[name], [name]                      MRN:          [id number]  DATE:03/09/2007                            DOB:          [birth year]    [name] returns today for further management.   PROBLEM LIST:  1. Coronary artery disease.  He will be status post coronary bypass      grafting 11 years ago come July of 2009.  He is having no angina or      ischemic equivalents.  His last stress Myoview was April 11, 2005.  EF was 63% with no ischemia.  2. Nonobstructive carotid disease.  He is due carotid Dopplers in      February of 2009.  He is currently asymptomatic.  3. Hyperlipidemia being followed by Dr. Laurance Flatten.  4. Type 2 diabetes.  5. Hypertension.  6. Obesity.  7. Chronic renal insufficiency.  Last creatinine I had was around 1.8.   He offers no complaints today other than the fact that he has had a very  difficult time with an appendix abscess that had to be drained and  followed up closely with Dr. Gala Romney and Dr.  Elinor Parkinson.   He has not obtained a nephrologist as I suggested.  I do not have a  recent creatinine, but I am told that someone stopped his Accupril.  He  is still on Diovan 80 mg a day.   His other cardiac medications are aspirin 81 mg a day, Vytorin 10/20  daily.  He is also on Glucotrol XL 10 mg a day, Actos 45 mg a day,  doxycycline 100 mg a day, multivitamins and Wellbutrin 300 mg a day.   PHYSICAL EXAMINATION:  GENERAL:  He is in his usual jovial mood.  Alert  and oriented x3.  VITAL SIGNS:  His blood pressure is 161/75, pulse is 84 and regular.  Weight is 255.  HEENT:  Normocephalic, atraumatic.  Pupils are equal, round and reactive  to light.  Extraocular movements intact.  Sclerae clear.  Face symmetry  normal.  NECK:  Carotids upstrokes are equal bilaterally.  No JVD.  He has soft  carotid bruit on the right.  Thyroid is not enlarged.  Trachea is  midline.  LUNGS:  Clear.  HEART:  A poorly appreciated PMI.  Normal S1-S2.  ABDOMEN:  Protuberant with good bowel sounds.  EXTREMITIES:  With 2+ edema.  Pulses are intact.  NEUROLOGIC:  Intact.   ELECTROCARDIOGRAM:  Sinus rhythm with a left axis deviation, low  voltage.  There are no acute changes.   I had a nice talk with Mr. Easter.  I am concerned about his blood  pressure, his creatinine, and the status of his coronary bypass grafts,  not to mention his carotids.   PLAN:  1. Carotid Doppler.  2. Adenosine Myoview.  3. Fasting lipids, comprehensive metabolic panel when he returns.  4. Adjustment of his blood pressure medicines along with Dr. Laurance Flatten.  5. Assuming everything is stable, will see him back in a year.  Thomas C. Verl Blalock, MD, [facility]  Electronically Signed    TCW/MedQ  DD: 03/09/2007  DT: 03/09/2007  Job #: [id number]   cc:   Chipper Herb, M.D.

## 2010-07-09 NOTE — Discharge Summary (Signed)
NAME:  NICHALOS, KOSUB               ACCOUNT NO.:  [id number]   MEDICAL RECORD NO.:  [id number]          PATIENT TYPE:  INP   LOCATION:  [id number]                         FACILITY:  [facility]   PHYSICIAN:  Neysa Bonito, MD  DATE OF BIRTH:  Dec 07, 1930   DATE OF ADMISSION:  10/29/2006  DATE OF DISCHARGE:  11/11/2006                               DISCHARGE SUMMARY   ADDENDUM:  For the days the patient has stated after the break of the  dictated discharge summary there is no major change in the medial  management, diagnosis or medication.      Neysa Bonito, MD  Electronically Signed     EME/MEDQ  D:  12/15/2006  T:  12/16/2006  Job:  [id number]

## 2010-07-09 NOTE — Assessment & Plan Note (Signed)
[facility]   NAME:  Bobby, Ho NO.:  [id number]   MEDICAL RECORD NO.:  [id number]      DATE OF BIRTH:  [birth year]   PHYSICIAN:  Epifania Gore. Nils Ho, M.D. VISIT DATE:  02/22/2007                                   OFFICE VISIT   SUBJECTIVE:  [name] is a 77 year old man with an ulceration in the  inner gluteal area associated with moisture consistent with cutaneous  candidiasis.  We have treated him with a Dakin's solution wash and rinse  followed by a Tinactin over-the-counter powder.  His itching has  decreased significantly.  There is no malodor.  There is been no  significant drainage.   OBJECTIVE:  VITAL SIGNS:  Blood pressure is 138/65, respirations 18,  pulse rate 75, and temperature 98.  Capillary blood glucose 79 mg  percent.  INSPECTION OF THE BUTTOCK AREA:  Shows that the area on the right  buttock is completely resolved.  Wound number #2 on the left buttock has  a transverse diameter of approximately 1.5 cm.  There is apparent  healthy appearing granulation.  There is no excessive drainage.  There  is no malodor.  No evidence of cellulitis or infection.   ASSESSMENT:  Slowly, but definitely improved buttock ulcers.   PLAN:  We will continue the taking and the over-the-counter Tinactin  powder.  We will reevaluate the patient in 2 weeks if there is not near  complete closure we will consider a biopsy.      [name], M.D.  Electronically Signed     HAN/MEDQ  D:  02/22/2007  T:  02/22/2007  Job:  [id number]

## 2010-07-09 NOTE — Assessment & Plan Note (Signed)
[facility]   NAME:  [name], [name] NO.:  [id number]   MEDICAL RECORD NO.:  [id number]      DATE OF BIRTH:  August 26, 1930   PHYSICIAN:  Epifania Gore. Nils Pyle, M.D. VISIT DATE:  04/19/2007                                   OFFICE VISIT   SUBJECTIVE:  [name] is a 77 year old man who is followed for a  pressure ulcer on his left buttock. The patient has been treated with  antiseptic topical's including soap and anti fungals.  This wound has  decreased in area but has not completely resolved over a 6-week period.  There is been no pain.   OBJECTIVE:  VITALS:  Blood pressure is 135/62, respirations 16, pulse  rate 66, temperature 97.9. Capillary blood glucose is 84 mg percent.  SKIN:  Inspection of the wound located on the medial aspect of the  buttock shows a hyperemic firm ulceration.  We obtained informed consent  and proceeded with a punch biopsy achieved in the following manner:  The  wound area was prepped with Betadine and alcohol. Thereafter an  infiltration of 1% Xylocaine was performed. Punch biopsy was taken  between the junction of normal appearing tissue and the ulceration.  This was forwarded to pathology in a separate specimen container.  A  solitary 3-0 nylon suture was used to control the resulting hemorrhage.  A pressure dressing was applied.  The patient was discharged from the  wound center in stable condition.  We will reevaluate him in 1 week to  review the pathology.  We instructed him to use Extra Strength Tylenol  for pain and notify the clinic for any increasing pain or any evidence  of fever.   We have given him an opportunity to ask questions.  He seems to  understand and indicates that he will be compliant.      Harold A. Nils Pyle, M.D.  Electronically Signed     HAN/MEDQ  D:  04/19/2007  T:  04/20/2007  Job:  [id number]

## 2010-07-09 NOTE — Assessment & Plan Note (Signed)
[facility]   NAME:  LABARRON, PIANKA NO.:  [id number]   MEDICAL RECORD NO.:  [id number]      DATE OF BIRTH:  [birth year]   PHYSICIAN:  Epifania Gore. Nils Pyle, M.D. VISIT DATE:  04/05/2007                                   OFFICE VISIT   SUBJECTIVE:  [name] is a 77 year old man who we have treated for  cutaneous mycosis in the intergluteal area.  We have treated him with  topical nystatin, antiseptic soap wash and topical Tinactin powder.  He  continues to have some minimum moisture in the area of the wound.  There  has been no malodor.  There has been no fever.   He is complaining of dizziness which has occurred over the weekend in  spite of a normal blood pressure and a regular pulse.  He has not  fallen.   OBJECTIVE:  Blood pressure is 135/61, respirations 18, pulse rate of  100, temp 98.4.  Capillary blood glucose is 148 mg percent.  Inspection  of the left buttock area shows that the wound has decreased slightly.  There is no hyperemia.  There is no malodor.  There is no drainage or  necrosis.  A debridement is not needed. There is a moderate amount of  moisture.   ASSESSMENT:  Slowly resolving stage II ulceration most likely cutaneous  mycosis, combined with pressure.   PLAN:  We are discontinuing his previous nystatin and Tinactin powder.  We will continue the antiseptic soap wash and will apply topical  iodosorb to the wound.  We provided him with a prescription for the  iodosorb and we will re-evaluate him in 2 weeks.   We have instructed him to give his cardiologist Dr. Verl Blalock a call with  regard to his dizziness.      Harold A. Nils Pyle, M.D.  Electronically Signed     HAN/MEDQ  D:  04/05/2007  T:  04/05/2007  Job:  [id number]

## 2010-07-09 NOTE — Consult Note (Signed)
NAME:  [name], [name]               ACCOUNT NO.:  [id number]   MEDICAL RECORD NO.:  [id number]          PATIENT TYPE:  REC   LOCATION:  FOOT                         FACILITY:  [facility]   PHYSICIAN:  Epifania Gore. Nils Pyle, M.D.DATE OF BIRTH:  1930/11/30   DATE OF CONSULTATION:  02/09/2007  DATE OF DISCHARGE:                                 CONSULTATION   SUBJECTIVE:  Mr. Bingenheimer is a 77 year old man who we have followed for  cutaneous mycosis involving the intergluteal area.  In the interim he  has been treated with antiseptic soap washes and rinses with Dakin's  solution and the topical application of over-the-counter Tinactin  powder.  He reports there is less itching, less weeping, and no pain.  There is no malodor.  He continues to be ambulatory.   OBJECTIVE:  VITAL SIGNS:  Blood pressure is 133/64, respirations 18,  pulse rate 82, temperature 98.  WOUNDS:  Inspection of the intergluteal area shows that wound #1 is  completely re-epithelialized.  Wound #2 is essentially covered with  epithelium with a minuscule area of granulation which is yet to resolve.  There is no evidence of cellulitis or induration.   ASSESSMENT:  Clinical response to the antifungal therapy.   PLAN:  We will continue the therapy as outlined above and reevaluate the  patient in two weeks p.r.n.      Harold A. Nils Pyle, M.D.  Electronically Signed     HAN/MEDQ  D:  02/09/2007  T:  02/09/2007  Job:  [id number]

## 2010-07-09 NOTE — Assessment & Plan Note (Signed)
[facility]   NAME:  [name], ASKIN NO.:  [id number]   MEDICAL RECORD NO.:  [id number]      DATE OF BIRTH:  1930-05-19   PHYSICIAN:  Epifania Gore. Nils Pyle, M.D. VISIT DATE:  03/08/2007                                   OFFICE VISIT   SUBJECTIVE:  [name] is a 77 year old man who we have treated for a  cutaneous candidiasis involving the gluteal area.  He has continued to  use antiseptic soap wash with irrigation to use in Dakin's solution and  topical Tinactin powder.  He reports less itch and no pain.  There has  been no interim fever or malodor.   OBJECTIVE:  VITALS:  Blood pressure is 146/66, respirations 18, pulse  rate 79, temperature 98.  Inspection of the buttock area shows that the  wound #2 continues to decrease.  There is still mild hyperemia with  evidence of advance in epithelium. There is no abscess formation and no  drainage.   ASSESSMENT:  Continued improvement of cutaneous candidiasis.   PLAN:  We have added a topical neosporin ointment.  We will re-evaluate  the patient in 2 weeks.  He will continue the antiseptic soap wash,  Dakin's rinse and Tinactin powder over the nystatin.      Harold A. Nils Pyle, M.D.  Electronically Signed     HAN/MEDQ  D:  03/08/2007  T:  03/08/2007  Job:  [id number]

## 2010-07-09 NOTE — Assessment & Plan Note (Signed)
[facility]   NAME:  [name], [name]               ACCOUNT NO.:  [id number]   MEDICAL RECORD NO.:  [id number]          DATE OF BIRTH:   PHYSICIAN:  Epifania Gore. Nils Pyle, M.D.      VISIT DATE:                                   OFFICE VISIT   SUBJECTIVE:  [name] is a 77 year old man who we have followed for a  stage II pressure ulcer involving the left buttock.   In the interim, he has applied a topical Iodosorb, avoided pressure and  continued local antiseptic soap washing.  There has been no excessive  drainage, malodor, pain, or fever.   OBJECTIVE:  VITAL SIGNS:  Blood pressure is 132/68, respirations 16,  pulse rate 72, temperature is 98.2.  Capillary blood glucose is 68 mg  percent.  GENERAL:  The patient is unaccompanied.  He is alert, oriented in good  contact with reality.  Inspection of the left buttock shows that the  wound shows evidence of continued improvement with decreasing and  contracting volume and advancing epithelium.  There is no evidence of  hyperemia, abscess, or inflammatory compromise.   ASSESSMENT:  Clinical improvement.   PLAN:  We will continue with local care and the topical application of  Iodosorb.  We will reevaluate the patient in 1 month, in anticipation  that the wound will be resolved.      Harold A. Nils Pyle, M.D.  Electronically Signed     HAN/MEDQ  D:  05/17/2007  T:  05/17/2007  Job:  [id number]

## 2010-07-09 NOTE — Assessment & Plan Note (Signed)
[facility]   NAME:  SHONDALE, SLAVINSKI NO.:  [id number]   MEDICAL RECORD NO.:  [id number]      DATE OF BIRTH:  Dec 25, 1930   PHYSICIAN:  Epifania Gore. Nils Pyle, M.D. VISIT DATE:  05/03/2007                                   OFFICE VISIT   SUBJECTIVE:  Mr. Dykhuizen is a 77 year old man whom we have followed for a  stage II pressure ulcer.  In the interim we have had a biopsy report  return.  The patient continues to be ambulatory.  There has been no  excessive drainage malodor pain or fever.   OBJECTIVE:  Blood pressure is 154/64, respirations 18, pulse rate 82,  temperature is 97.9.  Capillary blood glucose is 83 mg percent.  Inspection of the left buttock shows that the wound continues to  contract.  There is near 100% re-epithelialization.  The previous stitch  placed for hemostasis was removed without difficulty.   Review of the pathology report shows findings consistent with dermal  fibrosis.  There is no evidence of malignancy and the constellation of  changes are consistent with a pressure ulcer.   ASSESSMENT:  Clinical improvement of stage II pressure ulcer.   PLAN:  We will continue the topical application of Iodosorb gel and  reevaluate the patient in 2 weeks.      Harold A. Nils Pyle, M.D.  Electronically Signed     HAN/MEDQ  D:  05/03/2007  T:  05/04/2007  Job:  [id number]   cc:   Chipper Herb, M.D.

## 2010-07-09 NOTE — Assessment & Plan Note (Signed)
[facility]   NAME:  ASENCION, MORES NO.:  [id number]   MEDICAL RECORD NO.:  [id number]      DATE OF BIRTH:  April 27, 1930   PHYSICIAN:  Epifania Gore. Nils Pyle, M.D. VISIT DATE:  06/14/2007                                   OFFICE VISIT   SUBJECTIVE:  Mr. Maves is a 77 year old man who is followed for a stage  II pressure ulcer.  We treated him with Elta-Sore topical and local  antibacterial washing.  He returns for a followup.  He reports that he  thinks that this wound has completely resolved.  There has been no  interim fever, malodor, or pain.   OBJECTIVE:  VITAL SIGNS:  Blood pressure is 159/69, respirations 16,  pulse rate 81, and temperature is 98.4.  Capillary blood glucose is 89  mg%.  BUTTOCKS:  Inspection of the buttock area from the lateral decubitus  position shows that the previous pressure ulcers are 100%  epithelialized.  There is no hyperemia or evidence of infection  whatsoever.   ASSESSMENT:  Resolved buttock ulcerations.   PLAN:  We are discharging the patient from active followup in the wound  center and encouraged him to keep his routine followup with his primary  care physician Dr. Laurance Flatten in [place].      Harold A. Nils Pyle, M.D.  Electronically Signed     HAN/MEDQ  D:  06/14/2007  T:  06/15/2007  Job:  [id number]   cc:   Chipper Herb, MD

## 2010-07-09 NOTE — Assessment & Plan Note (Signed)
[facility]   NAME:  [name], [name] NO.:  [id number]   MEDICAL RECORD NO.:  [id number]      DATE OF BIRTH:  1930-08-11   PHYSICIAN:  Epifania Gore. Nils Pyle, M.D. VISIT DATE:  01/26/2007                                   OFFICE VISIT   SUBJECTIVE:  [name] is a 77 year old man who we are following for a  cutaneous mycosis involving the inner gluteal area.  We have treated him  with topical Dakin solution wash and rinse followed by topical  application of Tinactin over-the-counter powder.  The patient was also  is complaining of an area on the third finger of the right hand  consistent with an early paronychia.  In the interim he has continued  the wound care.  The area of the paronychia has decreased significantly  and is essentially nonexistent now.   OBJECTIVE:  VITAL SIGNS:  Blood pressure is 145/72, respirations 18,  pulse rate 82, temperature 98.2, capillary blood glucose is 109 mg  percent.  Inspection of the gluteal area shows that the erythema has receded  significantly.  The wound on the right buttock is essentially re-  epithelialization.  There is a minuscule amount of erythema residual.  Wound #2 has significantly decreased in the erythema.  There is no  drainage.  There is no malodor.  Wound #3 of the right third finger is  completely resolved.   ASSESSMENT:  Clinical improvement of the cutaneous mycosis to the  Dakin's solution and topical agents.   PLAN:  We will continue the current treatment and reevaluate the patient  in 2 weeks p.r.n.      Harold A. Nils Pyle, M.D.  Electronically Signed     HAN/MEDQ  D:  01/26/2007  T:  01/26/2007  Job:  [id number]

## 2010-07-09 NOTE — Consult Note (Signed)
NAME:  [name], [name]               ACCOUNT NO.:  [id number]   MEDICAL RECORD NO.:  [id number]          PATIENT TYPE:  AMB   LOCATION:  DAY                           FACILITY:  [facility]   PHYSICIAN:  Caro Hight, M.D.      DATE OF BIRTH:  17-Nov-1930   DATE OF CONSULTATION:  12/18/2006  DATE OF DISCHARGE:                                 CONSULTATION   CHIEF COMPLAINT:  For colonoscopy/follow-up recent hospitalization.   HISTORY OF PRESENT ILLNESS:  Ms. Lemmen is a 77 year old Caucasian male.  He was admitted to [facility] with a right lower  quadrant inflammatory process and abscess and underwent percutaneous  drainage and IV antibiotics.  It was felt to be due to either cecal  diverticulitis or appendicitis.  She was seen by Dr. Odis Hollingshead  for further consideration.  Dr. Zella Richer is requesting a colonoscopy.  CT scan from October 31, 2006, shows within the right lower quadrant,  there is pericecal gas collection and focus of inflammation situated  between the cecum and the abdominal wall which measures 43 x 40 mm, no  diverticula demonstrated at this region.  The appendix was not  definitely identified.  CT from November 09, 2006, showed very trace  amount of gas and debris __________ to the cold tip of the pigtail  catheter was which was during the incision and drainage which occurred  on November 07, 2006.  He was treated with vancomycin, Zosyn and Flagyl  while hospitalized.  Drain has been removed.  Fluid showed multiple  organisms none predominant.  He had a CBC today which showed a  hemoglobin 12.6, hematocrit 37.8, platelet count 29, white blood cell  count 7.3.  He had a creatinine of 1.41.  Overall, he is feeling better.  He does complain of constipation.  He tells me he continues to have a  discomfort in his right lower quadrant.  He has had irritation to his  perineum.  He denies any diarrhea, has not had a bowel movement in  several days now.   Denies any rectal bleeding or melena.  Denies any  fever or chills.  He has had multiple C.  difficile toxins which were  negative.  He denies any nausea or vomiting.  He has gone three days  without a bowel movement.   PAST MEDICAL AND SURGICAL HISTORY:  1. He had a colonoscopy by Dr. Gala Romney on  May 05, 2003, which showed      internal hemorrhoids, a long, capacious but otherwise normal colon.      Her medications for possible 150 mg times to just completed      allergies.  Five Macrodantin  2. Recent hospitalization as described in HPI.  3. Hypercholesterolemia.  4. Hypertension.  5. Diabetes mellitus.  6. PTSD.  7. Chronic kidney disease.  8. History of agent orange exposure.  9. He has had surgery for spina bifida.  10.Left knee surgery.  11.CABG.   CURRENT MEDICATION:  Fluconazole 150 mg x2 just completed.   ALLERGIES:  SULFA and MACRODANTIN.   FAMILY HISTORY:  There  is no known family history of colorectal  carcinoma or other chronic GI problems.   SOCIAL HISTORY:  Mr. Arostegui is married.  He has four healthy children.  He is retired Company secretary.  He has a remote history of tobacco use.  Denies alcohol or drug use.   REVIEW OF SYSTEMS:  See HPI.   PHYSICAL EXAMINATION:  VITAL SIGNS:  Weight 244 pounds, height 69  inches, temperature 98.3, blood pressure 130/80 and pulse 82.  GENERAL:  Mr. Rhames is an elderly Caucasian male who is alert and  oriented, pleasant, cooperative, in no acute distress.  He is obese.  HEENT:  Sclerae are clear, nonicteric.  Conjunctivae are pink.  Oropharyngeal mucosa moist without lesions.  NECK:  Supple without mass or thyromegaly.  HEART:  Regular rate and rhythm, normal S1 and S2.  No murmurs, clicks,  rubs or gallops.  LUNGS:  Clear to auscultation bilaterally.  ABDOMEN:  Positive bowel sounds x4.  No bruits auscultated.  Soft,  mildly distended.  He does have significant right lower quadrant  tenderness on deep palpation.  There  is no rebound, tenderness or  guarding, no hepatosplenomegaly or mass.  Exam is limited given the  patient's body habitus.  EXTREMITIES:  Without clubbing or edema bilaterally.  SKIN:  Pink, warm and dry without any jaundice.  RECTAL:  He has significant excoriation and irritation as well as  ulceration to bilateral buttocks.  He has a erythematous demarcated  region approximately 4 x 6 cm around his anus.   IMPRESSION:  Mr. Sunde is a 77 year old male with history of  hospitalization due to a right lower quadrant abscess.  He has had  intravenous  antibiotics therapy while hospitalized.  Differentials  include ruptured cecal diverticulum with abscess versus carcinoma versus  appendiceal abscess.  He also was has significantly erythematous and  ulcerated perineum consistent with cellulitis.   PLAN:  1. He is to apply bacitracin topically b.i.d.  2. Begin Levaquin 500 mg daily for seven days.  3. CT scan of the abdomen and pelvis with IV and oral contrast prior      to colonoscopy to determine whether there has been resolution of      abscess prior to embarking on colonoscopy as he is at risk for      perforation with peritonitis.   I would like to thank Dr. Laurance Flatten for allowing [place] to participate in the  care of Mr. Brietzke.      Vickey Huger, N.P.      Caro Hight, M.D.  Electronically Signed   KJ/MEDQ  D:  12/20/2006  T:  12/21/2006  Job:  [id number]

## 2010-07-09 NOTE — H&P (Signed)
NAMEBYRANT, CHAIRS NO.:  [id number]   MEDICAL RECORD NO.:  [id number]          PATIENT TYPE:  INP   LOCATION:  5502                         FACILITY:  [facility]   PHYSICIAN:  Cyril Mourning, D.O.    DATE OF BIRTH:  1931/01/27   DATE OF ADMISSION:  10/29/2006  DATE OF DISCHARGE:                              HISTORY & PHYSICAL   PRIMARY CARE PHYSICIAN:  Dr. Redge Gainer.   CHIEF COMPLAINT:  Wheezing and respiratory difficulty.   HISTORY OF PRESENT ILLNESS:  [name] is a very pleasant 77 year old  male known to our service, who has a history of diabetes and  hypertension as well as chronic renal insufficiency, who had been in his  usual state of health up until this past Saturday.  He stated that he  has been quite constipated and subsequently he lost his appetite because  he had not had a bowel movement since Saturday.  Due to his poor oral  intake, his blood sugar dropped to 34.  His wife arrived from being out  to discover this and he describes seeing demons; 911 was dispatched.  He did respond to juice, although his breathing and respiratory were of  concern by EMS and his wife, therefore they presented to the emergency  department.  His chest x-ray was unremarkable in the emergency  department and he was hemodynamically stable, but his renal function was  much worse than it was previously with a creatinine of 2.7.  He is  chronically on both Diovan as well as Accupril.  These are being held at  this time and he is undergoing gentle IV hydration.  In the emergency  department, there was no urinalysis performed.  He does have a Foley  catheter, which was placed by Dr. Karsten Ro just recently due to voiding  difficulties.  He is known to our service from a previous  hospitalization earlier this year.   PAST MEDICAL HISTORY:  Significant for:  1. Previous partial tear, iliopsoas muscle, status post fall.  2. Spina bifida with neurogenic bladder.  3. History of  hypertension.  4. History of coronary artery disease, status post bypass graft.  Most      recent ejection fraction was 63%.  5. History of cerebrovascular disease.  6. Hyperlipidemia.  7. Type 2 diabetes.  8. Obesity.  9. Prostatomegaly, status post transurethral resection of the      prostate.  10.Macular degeneration.  11.Chronic renal insufficiency as described above.  12.Obesity.   MEDICATIONS:  1. He takes Accupril 40 mg daily.  2. Actos 45 mg q.h.s.  3. Diovan 80 mg q.a.m.  4. Doxycycline 100 mg daily.  5. Glucotrol XL 10 mg q.a.m.  6. Aspirin daily.  7. Cipro - he was just recently started on by Dr. Karsten Ro.  We do not      have an urinalysis this admission.  We are getting one as we speak.   ALLERGIES:  ALLERGIES TO MACRODANTIN AND SULFA.   SOCIAL HISTORY:  [name] lives at home with his wife.  He has four  children.  He  quit smoking many years ago.  Denies alcohol.   FAMILY HISTORY:  Significant for a mother and father who died at 81 and  58 with heart disease.   REVIEW OF SYSTEMS:  He had been in his usual state of health with the  exception of some constipation.  He has not had additional complaints.  He ambulates with a cane and a walker at home.   PHYSICAL EXAMINATION:  VITAL SIGNS:  In the emergency department, his  blood pressure was 125/68.  Temperature of 97.5.  Heart rate 100.  Respirations 20.  O2 saturation 96%.  HEENT:  His head is normocephalic, atraumatic.  Extraocular muscles are  intact.  Neck is supple, nontender, no palpable thyromegaly or mass.  CARDIOVASCULAR EXAM:  Reveals normal S1, S2.  LUNGS:  Reveal diffuse and scattered wheezing bilaterally.  He exhibits  normal effort.  ABDOMEN:  Soft and tender diffusely.  There was no rebound or guarding.  LOWER EXTREMITIES:  Reveal no edema.  Peripheral pulses are symmetrical  and palpable.  NEUROLOGIC:  He is alert and oriented times three.   LABORATORY DATA:  EKG revealed normal sinus rhythm  with left anterior  fascicular block and possible lateral infarct, age indeterminate.  His  WBC was 17.1, hemoglobin 10.9, platelet count 211,000, MCV of 94.  Sodium of 131, potassium of 4.7, BUN of 65, creatinine of 2.7, glucose  224.  Chest x-ray with no cardiomegaly, no edema, no focal pulmonary  consolidation.   ASSESSMENT:  1. Acute renal failure, likely volume-related.  2. Obstipation.  3. Hypercholesterolemia.  4. Elevated white blood count.  5. Acute bronchitis.  6. Neurogenic bladder with a history of previous infections.  7. Recent placement of Foley catheter by Dr. Karsten Ro.   PLAN:  At this time, we are going to admit [name].  Hydrate him  generously with normal saline.  We will follow his renal function and  his clinical course and administer his home medications.  We will avoid  nephrotoxic agents such as Diovan and Accupril and reconsider  alternative agents.  We will implement labetalol for poorly-controlled  hypertension.  We will continue Actos at this juncture.  We will treat  his bronchitis with nebulizers and  Levaquin.  We will place him on a sliding scale for now and consider a  basal insulin dosage.  We will implement a bowel regimen as this  certainly is hindering his ability to thrive as his appetite diminishes,  which predisposes him to hypoglycemia.      Cyril Mourning, D.O.  Electronically Signed     ESS/MEDQ  D:  10/30/2006  T:  10/30/2006  Job:  [id number]   cc:   Chipper Herb, M.D.

## 2010-10-22 ENCOUNTER — Encounter: Payer: Self-pay | Admitting: Cardiology

## 2010-11-06 ENCOUNTER — Ambulatory Visit (INDEPENDENT_AMBULATORY_CARE_PROVIDER_SITE_OTHER): Payer: Medicare Other | Admitting: Cardiology

## 2010-11-06 ENCOUNTER — Encounter: Payer: Self-pay | Admitting: Cardiology

## 2010-11-06 VITALS — BP 102/61 | HR 101 | Resp 14 | Ht 68.0 in | Wt 238.0 lb

## 2010-11-06 DIAGNOSIS — I10 Essential (primary) hypertension: Secondary | ICD-10-CM

## 2010-11-06 DIAGNOSIS — E785 Hyperlipidemia, unspecified: Secondary | ICD-10-CM

## 2010-11-06 DIAGNOSIS — I2510 Atherosclerotic heart disease of native coronary artery without angina pectoris: Secondary | ICD-10-CM

## 2010-11-06 MED ORDER — VALSARTAN 80 MG PO TABS: 80.0000 mg | ORAL_TABLET | Freq: Every day | ORAL | Status: DC

## 2010-11-06 MED ORDER — METOPROLOL TARTRATE 25 MG PO TABS: 25.0000 mg | ORAL_TABLET | Freq: Two times a day (BID) | ORAL | Status: DC

## 2010-11-06 NOTE — Patient Instructions (Signed)
Your physician has recommended you make the following change in your medication:  decrease Valsartan Start Metoprolol  Your physician recommends that you schedule a follow-up appointment in: 6 months with Dr. Verl Blalock  Your physician has requested that you regularly monitor and record your blood pressure readings at home. Please use the same machine at the same time of day to check your readings and record them to bring to your follow-up visit.

## 2010-11-06 NOTE — Assessment & Plan Note (Signed)
Stable. We'll add metoprolol 25 mg a day with his heart rate in the 90s and history of coronary disease.

## 2010-11-06 NOTE — Assessment & Plan Note (Signed)
Improved. Will decrease valsartan 80 mg a day with the addition of metoprolol.

## 2010-11-06 NOTE — Progress Notes (Signed)
HPI [name] returns today for evaluation and management of his coronary disease, hypertension, nonobstructive carotid artery disease.  He says he did remarkably well. Having no angina or exertional ischemic symptoms. He denies any palpitations, presyncope, orthopnea, or edema. His laboratory data is being followed Dr. Laurance Flatten. He is compliant with his medications. EKG not repeated today. Stable in March. Past Medical History  Diagnosis Date  . CAD (coronary artery disease)     native vessel  . Diabetes mellitus type II   . Hyperlipidemia   . HTN (hypertension)   . Chronic renal insufficiency   . Carotid artery disease     nonobstructive  . Anemia   . Hypokalemia   . Malnutrition     protein-calorie  . PTSD (post-traumatic stress disorder)     Past Surgical History  Procedure Date  . Spinda bifida surgery   . Left knee surgery   . Coronary artery bypass graft 1998?    No family history on file.  History   Social History  . Marital Status: Married    Spouse Name: N/A    Number of Children: N/A  . Years of Education: N/A   Occupational History  . Retired    Social History Main Topics  . Smoking status: Former Research scientist (life sciences)  . Smokeless tobacco: Not on file  . Alcohol Use: No  . Drug Use: No  . Sexually Active: Not on file   Other Topics Concern  . Not on file   Social History Narrative  . No narrative on file    Allergies  Allergen Reactions  . Nitrofurantoin   . Sulfonamide Derivatives     REACTION: pruitis    Current Outpatient Prescriptions  Medication Sig Dispense Refill  . Ascorbic Acid (VITAMIN C) 1000 MG tablet Take 1,000 mg by mouth daily.        Marland Kitchen aspirin 81 MG tablet Take 81 mg by mouth daily.        Marland Kitchen buPROPion (WELLBUTRIN XL) 300 MG 24 hr tablet Take 300 mg by mouth every morning.        . Cholecalciferol (VITAMIN D-3) 5000 UNITS TABS Take 1 tablet by mouth daily.        . Coenzyme Q10 (CO Q-10) 100 MG CAPS Take 1 capsule by mouth 2 (two) times  daily.        Marland Kitchen doxycycline (VIBRAMYCIN) 100 MG capsule Take 100 mg by mouth daily.        Marland Kitchen ezetimibe-simvastatin (VYTORIN) 10-20 MG per tablet Take 1 tablet by mouth at bedtime.        Marland Kitchen glipiZIDE (GLUCOTROL XL) 10 MG 24 hr tablet Take 10 mg by mouth daily.        . Lutein 40 MG CAPS Take 1 capsule by mouth daily.        . nitroGLYCERIN (NITROSTAT) 0.4 MG SL tablet Place 0.4 mg under the tongue every 5 (five) minutes as needed.        . NON FORMULARY cranactin 100mg  (cranberry) 1 tab po qd         . Omega-3 Fatty Acids (FISH OIL) 1000 MG CAPS Take 1 capsule by mouth 2 (two) times daily.        . valsartan (DIOVAN) 160 MG tablet Take 160 mg by mouth daily.          ROS Negative other than HPI.   PE General Appearance: well developed, well nourished in no acute distress, obese, uses walker HEENT: symmetrical face, PERRLA,  good dentition  Neck: no JVD, thyromegaly, or adenopathy, trachea midline Chest: symmetric without deformity Cardiac: PMI non-displaced, RRR, normal S1, S2, no gallop or murmur Lung: clear to ausculation and percussion Vascular: all pulses present, but diminished in the lower extremities. No carotid bruits  Abdominal: nondistended, nontender, good bowel sounds, no HSM, no bruits Extremities: no cyanosis, clubbing or edema, no sign of DVT, no varicosities,open blister of left tibia without obvious infection. Skin: normal color, no rashes Neuro: alert and oriented x 3, non-focal Pysch: normal affect Filed Vitals:   11/06/10 1032  BP: 102/61  Pulse: 101  Resp: 14  Height: 5\' 8"  (1.727 m)  Weight: 238 lb (107.956 kg)    EKG  Labs and Studies Reviewed.   No results found for this basename: WBC, HGB, HCT, MCV, PLT      Chemistry      Component Value Date/Time   NA 139 03/24/2007 1113   K 4.0 03/24/2007 1113   CL 106 03/24/2007 1113   CO2 26 03/24/2007 1113   BUN 20 03/24/2007 1113   CREATININE 1.5 03/24/2007 1113      Component Value Date/Time   CALCIUM  9.4 03/24/2007 1113   ALKPHOS 47 03/24/2007 1113   AST 22 03/24/2007 1113   ALT 15 03/24/2007 1113   BILITOT 1.0 03/24/2007 1113       Lab Results  Component Value Date   CHOL 153 03/24/2007   Lab Results  Component Value Date   HDL 48.6 03/24/2007   Lab Results  Component Value Date   LDLCALC 85 03/24/2007   Lab Results  Component Value Date   TRIG 97 03/24/2007   Lab Results  Component Value Date   CHOLHDL 3.1 CALC 03/24/2007   No results found for this basename: HGBA1C   Lab Results  Component Value Date   ALT 15 03/24/2007   AST 22 03/24/2007   ALKPHOS 47 03/24/2007   BILITOT 1.0 03/24/2007   No results found for this basename: TSH

## 2010-11-22 LAB — BASIC METABOLIC PANEL
CO2: 24
Chloride: 105
GFR calc Af Amer: 44 — ABNORMAL LOW
Potassium: 4.9
Sodium: 135

## 2010-11-22 LAB — POCT HEMOGLOBIN-HEMACUE: Hemoglobin: 10.9 — ABNORMAL LOW

## 2010-11-26 ENCOUNTER — Encounter: Payer: Self-pay | Admitting: Cardiology

## 2010-12-05 LAB — BASIC METABOLIC PANEL
CO2: 26
CO2: 27
CO2: 27
Chloride: 111
GFR calc Af Amer: >60
GFR calc Af Amer: >60
GFR calc non Af Amer: 56 — ABNORMAL LOW
Glucose, Bld: 111 — ABNORMAL HIGH
Glucose, Bld: 120 — ABNORMAL HIGH
Potassium: 3.4 — ABNORMAL LOW
Potassium: 3.6
Potassium: 3.6
Sodium: 137
Sodium: 140
Sodium: 141

## 2010-12-05 LAB — CBC
HCT: 27 — ABNORMAL LOW
Hemoglobin: 9.2 — ABNORMAL LOW
MCHC: 34.2
RBC: 2.91 — ABNORMAL LOW
RDW: 14.2 — ABNORMAL HIGH

## 2010-12-05 LAB — CLOSTRIDIUM DIFFICILE EIA: C difficile Toxins A+B, EIA: NEGATIVE

## 2010-12-06 LAB — URINE CULTURE
Colony Count: NO GROWTH
Culture: NO GROWTH

## 2010-12-06 LAB — BASIC METABOLIC PANEL
BUN: 10
BUN: 65 — ABNORMAL HIGH
BUN: 67 — ABNORMAL HIGH
CO2: 25
Calcium: 8.1 — ABNORMAL LOW
Calcium: 9.4
Chloride: 101
Chloride: 110
Chloride: 99
Creatinine, Ser: 1.29
GFR calc Af Amer: >60
GFR calc non Af Amer: 23 — ABNORMAL LOW
GFR calc non Af Amer: 24 — ABNORMAL LOW
GFR calc non Af Amer: 39 — ABNORMAL LOW
Glucose, Bld: 101 — ABNORMAL HIGH
Potassium: 4
Potassium: 4.7
Potassium: 4.9
Sodium: 131 — ABNORMAL LOW
Sodium: 131 — ABNORMAL LOW
Sodium: 138

## 2010-12-06 LAB — DIFFERENTIAL
Basophils Absolute: 0
Basophils Absolute: 0
Basophils Relative: 0
Basophils Relative: 0
Eosinophils Absolute: 0
Eosinophils Absolute: 0.1
Eosinophils Relative: 0
Lymphocytes Relative: 10 — ABNORMAL LOW
Lymphocytes Relative: 3 — ABNORMAL LOW
Lymphs Abs: 0.6 — ABNORMAL LOW
Monocytes Absolute: 1.1 — ABNORMAL HIGH
Monocytes Relative: 6
Monocytes Relative: 7
Neutro Abs: 13.1 — ABNORMAL HIGH
Neutro Abs: 8.2 — ABNORMAL HIGH
Neutrophils Relative %: 76
Neutrophils Relative %: 84 — ABNORMAL HIGH

## 2010-12-06 LAB — URINALYSIS, MICROSCOPIC ONLY
Bilirubin Urine: NEGATIVE
Ketones, ur: NEGATIVE
Nitrite: NEGATIVE
Specific Gravity, Urine: 1.022
Urobilinogen, UA: 1

## 2010-12-06 LAB — CBC
HCT: 26.1 — ABNORMAL LOW
HCT: 26.7 — ABNORMAL LOW
HCT: 28.3 — ABNORMAL LOW
HCT: 29.4 — ABNORMAL LOW
HCT: 31.8 — ABNORMAL LOW
Hemoglobin: 10.9 — ABNORMAL LOW
Hemoglobin: 8.9 — ABNORMAL LOW
Hemoglobin: 9 — ABNORMAL LOW
Hemoglobin: 9.2 — ABNORMAL LOW
Hemoglobin: 9.5 — ABNORMAL LOW
Hemoglobin: 9.9 — ABNORMAL LOW
MCHC: 33.6
MCHC: 34.2
MCV: 93.1
MCV: 93.6
MCV: 94
MCV: 94.1
Platelets: 198
Platelets: 211
Platelets: 318
Platelets: 326
Platelets: 327
RBC: 2.84 — ABNORMAL LOW
RBC: 3.01 — ABNORMAL LOW
RBC: 3.03 — ABNORMAL LOW
RDW: 14.4 — ABNORMAL HIGH
RDW: 14.6 — ABNORMAL HIGH
RDW: 14.7 — ABNORMAL HIGH
RDW: 14.7 — ABNORMAL HIGH
WBC: 13.9 — ABNORMAL HIGH
WBC: 15.4 — ABNORMAL HIGH
WBC: 15.7 — ABNORMAL HIGH
WBC: 17.1 — ABNORMAL HIGH
WBC: 9.8

## 2010-12-06 LAB — COMPREHENSIVE METABOLIC PANEL
Albumin: 1.9 — ABNORMAL LOW
BUN: 38 — ABNORMAL HIGH
BUN: 6
CO2: 26
Calcium: 8.6
Chloride: 110
Creatinine, Ser: 1.18
GFR calc non Af Amer: >60
Glucose, Bld: 103 — ABNORMAL HIGH
Glucose, Bld: 145 — ABNORMAL HIGH
Total Bilirubin: 0.5
Total Protein: 4.9 — ABNORMAL LOW

## 2010-12-06 LAB — CULTURE, ROUTINE-ABSCESS

## 2010-12-06 LAB — CULTURE, BLOOD (ROUTINE X 2)

## 2010-12-06 LAB — ANAEROBIC CULTURE

## 2010-12-06 LAB — PROTIME-INR: INR: 1.3

## 2010-12-06 LAB — FUNGUS CULTURE W SMEAR: Fungal Smear: NONE SEEN

## 2011-03-03 ENCOUNTER — Encounter: Payer: Self-pay | Admitting: Cardiology

## 2011-03-03 DIAGNOSIS — E559 Vitamin D deficiency, unspecified: Secondary | ICD-10-CM | POA: Diagnosis not present

## 2011-03-03 DIAGNOSIS — E119 Type 2 diabetes mellitus without complications: Secondary | ICD-10-CM | POA: Diagnosis not present

## 2011-03-03 DIAGNOSIS — R5381 Other malaise: Secondary | ICD-10-CM | POA: Diagnosis not present

## 2011-03-03 DIAGNOSIS — I10 Essential (primary) hypertension: Secondary | ICD-10-CM | POA: Diagnosis not present

## 2011-03-03 DIAGNOSIS — E785 Hyperlipidemia, unspecified: Secondary | ICD-10-CM | POA: Diagnosis not present

## 2011-03-03 DIAGNOSIS — N400 Enlarged prostate without lower urinary tract symptoms: Secondary | ICD-10-CM | POA: Diagnosis not present

## 2011-03-03 DIAGNOSIS — I2510 Atherosclerotic heart disease of native coronary artery without angina pectoris: Secondary | ICD-10-CM | POA: Diagnosis not present

## 2011-04-02 DIAGNOSIS — F4310 Post-traumatic stress disorder, unspecified: Secondary | ICD-10-CM | POA: Diagnosis not present

## 2011-04-02 DIAGNOSIS — F0390 Unspecified dementia without behavioral disturbance: Secondary | ICD-10-CM | POA: Diagnosis not present

## 2011-04-03 DIAGNOSIS — E119 Type 2 diabetes mellitus without complications: Secondary | ICD-10-CM | POA: Diagnosis not present

## 2011-04-03 DIAGNOSIS — E1159 Type 2 diabetes mellitus with other circulatory complications: Secondary | ICD-10-CM | POA: Diagnosis not present

## 2011-05-02 ENCOUNTER — Ambulatory Visit: Payer: Medicare Other | Admitting: Cardiology

## 2011-05-08 ENCOUNTER — Ambulatory Visit (INDEPENDENT_AMBULATORY_CARE_PROVIDER_SITE_OTHER): Payer: Medicare Other | Admitting: Cardiology

## 2011-05-08 ENCOUNTER — Encounter: Payer: Self-pay | Admitting: Cardiology

## 2011-05-08 VITALS — BP 136/72 | HR 66 | Ht 68.0 in | Wt 249.0 lb

## 2011-05-08 DIAGNOSIS — I2510 Atherosclerotic heart disease of native coronary artery without angina pectoris: Secondary | ICD-10-CM | POA: Diagnosis not present

## 2011-05-08 DIAGNOSIS — E119 Type 2 diabetes mellitus without complications: Secondary | ICD-10-CM

## 2011-05-08 DIAGNOSIS — N189 Chronic kidney disease, unspecified: Secondary | ICD-10-CM | POA: Diagnosis not present

## 2011-05-08 DIAGNOSIS — I10 Essential (primary) hypertension: Secondary | ICD-10-CM

## 2011-05-08 NOTE — Patient Instructions (Signed)
Your physician wants you to follow-up in:  12 months.  You will receive a reminder letter in the mail two months in advance. If you don't receive a letter, please call our office to schedule the follow-up appointment.   

## 2011-05-08 NOTE — Progress Notes (Signed)
HPI [name] comes in today for evaluation and management of his multiple cardiac and vascular issues.  He's done well greatest disability it is being a [name] with his arthritis and chronic back and hip problems. He denies any angina or chest discomfort. His blood work is being monitored by his primary care physician Dr. Laurance Ho.  Past Medical History  Diagnosis Date  . CAD (coronary artery disease)     native vessel  . Diabetes mellitus type II   . Hyperlipidemia   . HTN (hypertension)   . Chronic renal insufficiency   . Carotid artery disease     nonobstructive  . Anemia   . Hypokalemia   . Malnutrition     protein-calorie  . PTSD (post-traumatic stress disorder)     Current Outpatient Prescriptions  Medication Sig Dispense Refill  . Ascorbic Acid (VITAMIN C) 1000 MG tablet Take 1,000 mg by mouth daily.        Marland Kitchen aspirin 81 MG tablet Take 81 mg by mouth daily.        Marland Kitchen buPROPion (WELLBUTRIN XL) 300 MG 24 hr tablet Take 300 mg by mouth every morning.        . Cholecalciferol (VITAMIN D-3) 5000 UNITS TABS Take 1 tablet by mouth daily.        . Coenzyme Q10 (CO Q-10) 100 MG CAPS Take 1 capsule by mouth 2 (two) times daily.       Marland Kitchen doxycycline (VIBRAMYCIN) 100 MG capsule Take 100 mg by mouth daily.        Marland Kitchen ezetimibe-simvastatin (VYTORIN) 10-20 MG per tablet Take 1 tablet by mouth at bedtime.        Marland Kitchen glipiZIDE (GLUCOTROL XL) 10 MG 24 hr tablet Take 10 mg by mouth daily.        . Lutein 40 MG CAPS Take 1 capsule by mouth daily.        . metoprolol tartrate (LOPRESSOR) 25 MG tablet Take 1 tablet (25 mg total) by mouth 2 (two) times daily.  180 tablet  3  . nitroGLYCERIN (NITROSTAT) 0.4 MG SL tablet Place 0.4 mg under the tongue every 5 (five) minutes as needed.        . NON FORMULARY cran-actin 100mg  1 tablet daily      . Omega-3 Fatty Acids (FISH OIL) 1000 MG CAPS Take 1 capsule by mouth 2 (two) times daily.        . valsartan (DIOVAN) 80 MG tablet Take 1 tablet (80 mg total) by  mouth daily.  90 tablet  3    Allergies  Allergen Reactions  . Nitrofurantoin   . Sulfonamide Derivatives     REACTION: pruitis    No family history on file.  History   Social History  . Marital Status: Married    Spouse Name: N/A    Number of Children: N/A  . Years of Education: N/A   Occupational History  . Retired    Social History Main Topics  . Smoking status: Former Research scientist (life sciences)  . Smokeless tobacco: Not on file  . Alcohol Use: No  . Drug Use: No  . Sexually Active: Not on file   Other Topics Concern  . Not on file   Social History Narrative  . No narrative on file    ROS ALL NEGATIVE EXCEPT THOSE NOTED IN HPI  PE  General Appearance: well developed, well nourished in no acute distress, obese but has lost weight HEENT: symmetrical face, PERRLA, good dentition  Neck: no  JVD, thyromegaly, or adenopathy, trachea midline Chest: symmetric without deformity Cardiac: PMI non-displaced, RRR, normal S1, S2, no gallop or murmur Lung: clear to ausculation and percussion Vascular: all pulses full without bruits  Abdominal: nondistended, nontender, good bowel sounds, no HSM, no bruits Extremities: no cyanosis, clubbing , 1+ pitting edema, no sign of DVT, no varicosities  Skin: normal color, no rashes Neuro: alert and oriented x 3, non-focal Pysch: normal affect  EKG Normal sinus rhythm, low voltage, left axis deviation, poor R-wave progression, no acute changes. BMET    Component Value Date/Time   NA 135 09/15/2007 1155   K 4.9 09/15/2007 1155   CL 105 09/15/2007 1155   CO2 24 09/15/2007 1155   GLUCOSE 186* 09/15/2007 1155   BUN 22 09/15/2007 1155   CREATININE 1.84* 09/15/2007 1155   CALCIUM 9.0 09/15/2007 1155   GFRNONAA 36* 09/15/2007 1155   GFRAA  Value: 44        The eGFR has been calculated using the MDRD equation. This calculation has not been validated in all clinical* 09/15/2007 1155    Lipid Panel     Component Value Date/Time   CHOL 153 03/24/2007 1113    TRIG 97 03/24/2007 1113   HDL 48.6 03/24/2007 1113   CHOLHDL 3.1 CALC 03/24/2007 1113   VLDL 19 03/24/2007 1113   LDLCALC 85 03/24/2007 1113    CBC    Component Value Date/Time   WBC 10.0 11/10/2006 0450   RBC 2.91* 11/10/2006 0450   HGB 10.9 POINT OF CARE RESULT* 09/17/2007 0643   HCT 27.0* 11/10/2006 0450   PLT 322 11/10/2006 0450   MCV 92.7 11/10/2006 0450   MCHC 34.2 11/10/2006 0450   RDW 14.2* 11/10/2006 0450   LYMPHSABS 1.7 11/07/2006 0330   MONOABS 0.8* 11/07/2006 0330   EOSABS 0.1 11/07/2006 0330   BASOSABS 0.0 11/07/2006 0330

## 2011-05-08 NOTE — Assessment & Plan Note (Signed)
Stable. Continue aggressive secondary preventive therapy.

## 2011-06-04 ENCOUNTER — Encounter: Payer: Self-pay | Admitting: Cardiology

## 2011-06-04 ENCOUNTER — Other Ambulatory Visit: Payer: Self-pay | Admitting: Family Medicine

## 2011-06-04 DIAGNOSIS — E785 Hyperlipidemia, unspecified: Secondary | ICD-10-CM | POA: Diagnosis not present

## 2011-06-04 DIAGNOSIS — R5381 Other malaise: Secondary | ICD-10-CM | POA: Diagnosis not present

## 2011-06-04 DIAGNOSIS — E119 Type 2 diabetes mellitus without complications: Secondary | ICD-10-CM | POA: Diagnosis not present

## 2011-06-04 DIAGNOSIS — R5383 Other fatigue: Secondary | ICD-10-CM | POA: Diagnosis not present

## 2011-06-04 DIAGNOSIS — M25511 Pain in right shoulder: Secondary | ICD-10-CM

## 2011-06-04 DIAGNOSIS — I2510 Atherosclerotic heart disease of native coronary artery without angina pectoris: Secondary | ICD-10-CM | POA: Diagnosis not present

## 2011-06-04 DIAGNOSIS — E559 Vitamin D deficiency, unspecified: Secondary | ICD-10-CM | POA: Diagnosis not present

## 2011-06-06 ENCOUNTER — Ambulatory Visit (HOSPITAL_COMMUNITY)
Admission: RE | Admit: 2011-06-06 | Discharge: 2011-06-06 | Disposition: A | Discharge status: Home / Self Care | Payer: Medicare Other | Source: Ambulatory Visit | Attending: Family Medicine | Admitting: Family Medicine

## 2011-06-06 DIAGNOSIS — M249 Joint derangement, unspecified: Secondary | ICD-10-CM | POA: Insufficient documentation

## 2011-06-06 DIAGNOSIS — M19019 Primary osteoarthritis, unspecified shoulder: Secondary | ICD-10-CM | POA: Diagnosis not present

## 2011-06-06 DIAGNOSIS — M25419 Effusion, unspecified shoulder: Secondary | ICD-10-CM | POA: Diagnosis not present

## 2011-06-06 DIAGNOSIS — M25519 Pain in unspecified shoulder: Secondary | ICD-10-CM | POA: Diagnosis not present

## 2011-06-06 DIAGNOSIS — M25511 Pain in right shoulder: Secondary | ICD-10-CM

## 2011-06-18 ENCOUNTER — Other Ambulatory Visit: Payer: Medicare Other

## 2011-06-19 DIAGNOSIS — E119 Type 2 diabetes mellitus without complications: Secondary | ICD-10-CM | POA: Diagnosis not present

## 2011-06-19 DIAGNOSIS — E1159 Type 2 diabetes mellitus with other circulatory complications: Secondary | ICD-10-CM | POA: Diagnosis not present

## 2011-06-20 ENCOUNTER — Other Ambulatory Visit: Payer: Self-pay | Admitting: *Deleted

## 2011-06-20 DIAGNOSIS — I6529 Occlusion and stenosis of unspecified carotid artery: Secondary | ICD-10-CM

## 2011-06-25 ENCOUNTER — Other Ambulatory Visit: Payer: Medicare Other

## 2011-06-25 ENCOUNTER — Encounter (INDEPENDENT_AMBULATORY_CARE_PROVIDER_SITE_OTHER): Payer: Medicare Other

## 2011-06-25 DIAGNOSIS — I6529 Occlusion and stenosis of unspecified carotid artery: Secondary | ICD-10-CM | POA: Diagnosis not present

## 2011-08-15 DIAGNOSIS — H2640 Unspecified secondary cataract: Secondary | ICD-10-CM | POA: Diagnosis not present

## 2011-08-15 DIAGNOSIS — E119 Type 2 diabetes mellitus without complications: Secondary | ICD-10-CM | POA: Diagnosis not present

## 2011-08-15 DIAGNOSIS — H43819 Vitreous degeneration, unspecified eye: Secondary | ICD-10-CM | POA: Diagnosis not present

## 2011-08-15 DIAGNOSIS — H3530 Unspecified macular degeneration: Secondary | ICD-10-CM | POA: Diagnosis not present

## 2011-09-04 DIAGNOSIS — E119 Type 2 diabetes mellitus without complications: Secondary | ICD-10-CM | POA: Diagnosis not present

## 2011-09-04 DIAGNOSIS — E1159 Type 2 diabetes mellitus with other circulatory complications: Secondary | ICD-10-CM | POA: Diagnosis not present

## 2011-09-04 DIAGNOSIS — L851 Acquired keratosis [keratoderma] palmaris et plantaris: Secondary | ICD-10-CM | POA: Diagnosis not present

## 2011-09-30 ENCOUNTER — Encounter: Payer: Self-pay | Admitting: Cardiology

## 2011-09-30 DIAGNOSIS — E559 Vitamin D deficiency, unspecified: Secondary | ICD-10-CM | POA: Diagnosis not present

## 2011-09-30 DIAGNOSIS — Z125 Encounter for screening for malignant neoplasm of prostate: Secondary | ICD-10-CM | POA: Diagnosis not present

## 2011-09-30 DIAGNOSIS — Z0000 Encounter for general adult medical examination without abnormal findings: Secondary | ICD-10-CM | POA: Diagnosis not present

## 2011-09-30 DIAGNOSIS — I2510 Atherosclerotic heart disease of native coronary artery without angina pectoris: Secondary | ICD-10-CM | POA: Diagnosis not present

## 2011-09-30 DIAGNOSIS — E785 Hyperlipidemia, unspecified: Secondary | ICD-10-CM | POA: Diagnosis not present

## 2011-09-30 DIAGNOSIS — I10 Essential (primary) hypertension: Secondary | ICD-10-CM | POA: Diagnosis not present

## 2011-10-07 DIAGNOSIS — N312 Flaccid neuropathic bladder, not elsewhere classified: Secondary | ICD-10-CM | POA: Diagnosis not present

## 2011-10-07 DIAGNOSIS — R339 Retention of urine, unspecified: Secondary | ICD-10-CM | POA: Diagnosis not present

## 2011-10-07 DIAGNOSIS — N401 Enlarged prostate with lower urinary tract symptoms: Secondary | ICD-10-CM | POA: Diagnosis not present

## 2011-11-13 DIAGNOSIS — E1159 Type 2 diabetes mellitus with other circulatory complications: Secondary | ICD-10-CM | POA: Diagnosis not present

## 2011-11-13 DIAGNOSIS — E119 Type 2 diabetes mellitus without complications: Secondary | ICD-10-CM | POA: Diagnosis not present

## 2011-11-25 DIAGNOSIS — F4310 Post-traumatic stress disorder, unspecified: Secondary | ICD-10-CM | POA: Diagnosis not present

## 2011-11-26 ENCOUNTER — Other Ambulatory Visit: Payer: Self-pay | Admitting: Cardiology

## 2011-11-26 MED ORDER — VALSARTAN 80 MG PO TABS: 80.0000 mg | ORAL_TABLET | Freq: Every day | ORAL | Status: DC

## 2011-11-26 NOTE — Telephone Encounter (Signed)
[facility] .   Metoprolol tartrate  25 mg .

## 2011-12-08 ENCOUNTER — Other Ambulatory Visit: Payer: Self-pay | Admitting: Cardiology

## 2011-12-15 DIAGNOSIS — M999 Biomechanical lesion, unspecified: Secondary | ICD-10-CM | POA: Diagnosis not present

## 2011-12-15 DIAGNOSIS — M9981 Other biomechanical lesions of cervical region: Secondary | ICD-10-CM | POA: Diagnosis not present

## 2011-12-15 DIAGNOSIS — M5137 Other intervertebral disc degeneration, lumbosacral region: Secondary | ICD-10-CM | POA: Diagnosis not present

## 2011-12-17 DIAGNOSIS — M9981 Other biomechanical lesions of cervical region: Secondary | ICD-10-CM | POA: Diagnosis not present

## 2011-12-17 DIAGNOSIS — M5137 Other intervertebral disc degeneration, lumbosacral region: Secondary | ICD-10-CM | POA: Diagnosis not present

## 2011-12-17 DIAGNOSIS — M999 Biomechanical lesion, unspecified: Secondary | ICD-10-CM | POA: Diagnosis not present

## 2011-12-18 DIAGNOSIS — M5137 Other intervertebral disc degeneration, lumbosacral region: Secondary | ICD-10-CM | POA: Diagnosis not present

## 2011-12-18 DIAGNOSIS — M9981 Other biomechanical lesions of cervical region: Secondary | ICD-10-CM | POA: Diagnosis not present

## 2011-12-18 DIAGNOSIS — M999 Biomechanical lesion, unspecified: Secondary | ICD-10-CM | POA: Diagnosis not present

## 2011-12-22 DIAGNOSIS — M9981 Other biomechanical lesions of cervical region: Secondary | ICD-10-CM | POA: Diagnosis not present

## 2011-12-22 DIAGNOSIS — M5137 Other intervertebral disc degeneration, lumbosacral region: Secondary | ICD-10-CM | POA: Diagnosis not present

## 2011-12-22 DIAGNOSIS — M999 Biomechanical lesion, unspecified: Secondary | ICD-10-CM | POA: Diagnosis not present

## 2011-12-24 DIAGNOSIS — M5137 Other intervertebral disc degeneration, lumbosacral region: Secondary | ICD-10-CM | POA: Diagnosis not present

## 2011-12-24 DIAGNOSIS — M999 Biomechanical lesion, unspecified: Secondary | ICD-10-CM | POA: Diagnosis not present

## 2011-12-24 DIAGNOSIS — M9981 Other biomechanical lesions of cervical region: Secondary | ICD-10-CM | POA: Diagnosis not present

## 2011-12-29 DIAGNOSIS — M9981 Other biomechanical lesions of cervical region: Secondary | ICD-10-CM | POA: Diagnosis not present

## 2011-12-29 DIAGNOSIS — M5137 Other intervertebral disc degeneration, lumbosacral region: Secondary | ICD-10-CM | POA: Diagnosis not present

## 2011-12-29 DIAGNOSIS — M999 Biomechanical lesion, unspecified: Secondary | ICD-10-CM | POA: Diagnosis not present

## 2011-12-31 DIAGNOSIS — M5137 Other intervertebral disc degeneration, lumbosacral region: Secondary | ICD-10-CM | POA: Diagnosis not present

## 2011-12-31 DIAGNOSIS — M999 Biomechanical lesion, unspecified: Secondary | ICD-10-CM | POA: Diagnosis not present

## 2011-12-31 DIAGNOSIS — M9981 Other biomechanical lesions of cervical region: Secondary | ICD-10-CM | POA: Diagnosis not present

## 2012-01-05 ENCOUNTER — Encounter: Payer: Self-pay | Admitting: Cardiology

## 2012-01-05 DIAGNOSIS — I10 Essential (primary) hypertension: Secondary | ICD-10-CM | POA: Diagnosis not present

## 2012-01-05 DIAGNOSIS — M999 Biomechanical lesion, unspecified: Secondary | ICD-10-CM | POA: Diagnosis not present

## 2012-01-05 DIAGNOSIS — Z23 Encounter for immunization: Secondary | ICD-10-CM | POA: Diagnosis not present

## 2012-01-05 DIAGNOSIS — E785 Hyperlipidemia, unspecified: Secondary | ICD-10-CM | POA: Diagnosis not present

## 2012-01-05 DIAGNOSIS — E559 Vitamin D deficiency, unspecified: Secondary | ICD-10-CM | POA: Diagnosis not present

## 2012-01-05 DIAGNOSIS — E119 Type 2 diabetes mellitus without complications: Secondary | ICD-10-CM | POA: Diagnosis not present

## 2012-01-05 DIAGNOSIS — M5137 Other intervertebral disc degeneration, lumbosacral region: Secondary | ICD-10-CM | POA: Diagnosis not present

## 2012-01-05 DIAGNOSIS — M9981 Other biomechanical lesions of cervical region: Secondary | ICD-10-CM | POA: Diagnosis not present

## 2012-01-05 DIAGNOSIS — I2510 Atherosclerotic heart disease of native coronary artery without angina pectoris: Secondary | ICD-10-CM | POA: Diagnosis not present

## 2012-01-05 DIAGNOSIS — N390 Urinary tract infection, site not specified: Secondary | ICD-10-CM | POA: Diagnosis not present

## 2012-01-07 DIAGNOSIS — M5137 Other intervertebral disc degeneration, lumbosacral region: Secondary | ICD-10-CM | POA: Diagnosis not present

## 2012-01-07 DIAGNOSIS — M9981 Other biomechanical lesions of cervical region: Secondary | ICD-10-CM | POA: Diagnosis not present

## 2012-01-07 DIAGNOSIS — M999 Biomechanical lesion, unspecified: Secondary | ICD-10-CM | POA: Diagnosis not present

## 2012-01-12 DIAGNOSIS — M999 Biomechanical lesion, unspecified: Secondary | ICD-10-CM | POA: Diagnosis not present

## 2012-01-12 DIAGNOSIS — M5137 Other intervertebral disc degeneration, lumbosacral region: Secondary | ICD-10-CM | POA: Diagnosis not present

## 2012-01-12 DIAGNOSIS — N390 Urinary tract infection, site not specified: Secondary | ICD-10-CM | POA: Diagnosis not present

## 2012-01-12 DIAGNOSIS — M9981 Other biomechanical lesions of cervical region: Secondary | ICD-10-CM | POA: Diagnosis not present

## 2012-01-14 DIAGNOSIS — M999 Biomechanical lesion, unspecified: Secondary | ICD-10-CM | POA: Diagnosis not present

## 2012-01-14 DIAGNOSIS — M5137 Other intervertebral disc degeneration, lumbosacral region: Secondary | ICD-10-CM | POA: Diagnosis not present

## 2012-01-14 DIAGNOSIS — M9981 Other biomechanical lesions of cervical region: Secondary | ICD-10-CM | POA: Diagnosis not present

## 2012-01-19 DIAGNOSIS — M9981 Other biomechanical lesions of cervical region: Secondary | ICD-10-CM | POA: Diagnosis not present

## 2012-01-19 DIAGNOSIS — M5137 Other intervertebral disc degeneration, lumbosacral region: Secondary | ICD-10-CM | POA: Diagnosis not present

## 2012-01-19 DIAGNOSIS — M999 Biomechanical lesion, unspecified: Secondary | ICD-10-CM | POA: Diagnosis not present

## 2012-01-26 DIAGNOSIS — M9981 Other biomechanical lesions of cervical region: Secondary | ICD-10-CM | POA: Diagnosis not present

## 2012-01-26 DIAGNOSIS — M999 Biomechanical lesion, unspecified: Secondary | ICD-10-CM | POA: Diagnosis not present

## 2012-01-26 DIAGNOSIS — M5137 Other intervertebral disc degeneration, lumbosacral region: Secondary | ICD-10-CM | POA: Diagnosis not present

## 2012-02-04 DIAGNOSIS — M5137 Other intervertebral disc degeneration, lumbosacral region: Secondary | ICD-10-CM | POA: Diagnosis not present

## 2012-02-04 DIAGNOSIS — M9981 Other biomechanical lesions of cervical region: Secondary | ICD-10-CM | POA: Diagnosis not present

## 2012-02-04 DIAGNOSIS — M999 Biomechanical lesion, unspecified: Secondary | ICD-10-CM | POA: Diagnosis not present

## 2012-02-05 DIAGNOSIS — E1159 Type 2 diabetes mellitus with other circulatory complications: Secondary | ICD-10-CM | POA: Diagnosis not present

## 2012-02-05 DIAGNOSIS — E119 Type 2 diabetes mellitus without complications: Secondary | ICD-10-CM | POA: Diagnosis not present

## 2012-02-09 DIAGNOSIS — M9981 Other biomechanical lesions of cervical region: Secondary | ICD-10-CM | POA: Diagnosis not present

## 2012-02-09 DIAGNOSIS — M5137 Other intervertebral disc degeneration, lumbosacral region: Secondary | ICD-10-CM | POA: Diagnosis not present

## 2012-02-09 DIAGNOSIS — M999 Biomechanical lesion, unspecified: Secondary | ICD-10-CM | POA: Diagnosis not present

## 2012-02-10 DIAGNOSIS — M999 Biomechanical lesion, unspecified: Secondary | ICD-10-CM | POA: Diagnosis not present

## 2012-02-10 DIAGNOSIS — M9981 Other biomechanical lesions of cervical region: Secondary | ICD-10-CM | POA: Diagnosis not present

## 2012-02-10 DIAGNOSIS — M5137 Other intervertebral disc degeneration, lumbosacral region: Secondary | ICD-10-CM | POA: Diagnosis not present

## 2012-02-19 DIAGNOSIS — M999 Biomechanical lesion, unspecified: Secondary | ICD-10-CM | POA: Diagnosis not present

## 2012-02-19 DIAGNOSIS — M5137 Other intervertebral disc degeneration, lumbosacral region: Secondary | ICD-10-CM | POA: Diagnosis not present

## 2012-02-19 DIAGNOSIS — M9981 Other biomechanical lesions of cervical region: Secondary | ICD-10-CM | POA: Diagnosis not present

## 2012-02-26 DIAGNOSIS — M9981 Other biomechanical lesions of cervical region: Secondary | ICD-10-CM | POA: Diagnosis not present

## 2012-02-26 DIAGNOSIS — M5137 Other intervertebral disc degeneration, lumbosacral region: Secondary | ICD-10-CM | POA: Diagnosis not present

## 2012-02-26 DIAGNOSIS — M999 Biomechanical lesion, unspecified: Secondary | ICD-10-CM | POA: Diagnosis not present

## 2012-03-04 DIAGNOSIS — M5137 Other intervertebral disc degeneration, lumbosacral region: Secondary | ICD-10-CM | POA: Diagnosis not present

## 2012-03-04 DIAGNOSIS — M999 Biomechanical lesion, unspecified: Secondary | ICD-10-CM | POA: Diagnosis not present

## 2012-03-04 DIAGNOSIS — M9981 Other biomechanical lesions of cervical region: Secondary | ICD-10-CM | POA: Diagnosis not present

## 2012-03-08 DIAGNOSIS — M5137 Other intervertebral disc degeneration, lumbosacral region: Secondary | ICD-10-CM | POA: Diagnosis not present

## 2012-03-08 DIAGNOSIS — M9981 Other biomechanical lesions of cervical region: Secondary | ICD-10-CM | POA: Diagnosis not present

## 2012-03-08 DIAGNOSIS — M999 Biomechanical lesion, unspecified: Secondary | ICD-10-CM | POA: Diagnosis not present

## 2012-03-15 DIAGNOSIS — M9981 Other biomechanical lesions of cervical region: Secondary | ICD-10-CM | POA: Diagnosis not present

## 2012-03-15 DIAGNOSIS — M999 Biomechanical lesion, unspecified: Secondary | ICD-10-CM | POA: Diagnosis not present

## 2012-03-15 DIAGNOSIS — M5137 Other intervertebral disc degeneration, lumbosacral region: Secondary | ICD-10-CM | POA: Diagnosis not present

## 2012-03-22 DIAGNOSIS — M999 Biomechanical lesion, unspecified: Secondary | ICD-10-CM | POA: Diagnosis not present

## 2012-03-22 DIAGNOSIS — M9981 Other biomechanical lesions of cervical region: Secondary | ICD-10-CM | POA: Diagnosis not present

## 2012-03-22 DIAGNOSIS — M5137 Other intervertebral disc degeneration, lumbosacral region: Secondary | ICD-10-CM | POA: Diagnosis not present

## 2012-03-31 DIAGNOSIS — M999 Biomechanical lesion, unspecified: Secondary | ICD-10-CM | POA: Diagnosis not present

## 2012-03-31 DIAGNOSIS — M5137 Other intervertebral disc degeneration, lumbosacral region: Secondary | ICD-10-CM | POA: Diagnosis not present

## 2012-03-31 DIAGNOSIS — M9981 Other biomechanical lesions of cervical region: Secondary | ICD-10-CM | POA: Diagnosis not present

## 2012-04-05 DIAGNOSIS — M5137 Other intervertebral disc degeneration, lumbosacral region: Secondary | ICD-10-CM | POA: Diagnosis not present

## 2012-04-05 DIAGNOSIS — M9981 Other biomechanical lesions of cervical region: Secondary | ICD-10-CM | POA: Diagnosis not present

## 2012-04-05 DIAGNOSIS — M999 Biomechanical lesion, unspecified: Secondary | ICD-10-CM | POA: Diagnosis not present

## 2012-04-13 DIAGNOSIS — M5137 Other intervertebral disc degeneration, lumbosacral region: Secondary | ICD-10-CM | POA: Diagnosis not present

## 2012-04-13 DIAGNOSIS — M999 Biomechanical lesion, unspecified: Secondary | ICD-10-CM | POA: Diagnosis not present

## 2012-04-13 DIAGNOSIS — M9981 Other biomechanical lesions of cervical region: Secondary | ICD-10-CM | POA: Diagnosis not present

## 2012-04-15 DIAGNOSIS — I2510 Atherosclerotic heart disease of native coronary artery without angina pectoris: Secondary | ICD-10-CM | POA: Diagnosis not present

## 2012-04-15 DIAGNOSIS — L570 Actinic keratosis: Secondary | ICD-10-CM | POA: Diagnosis not present

## 2012-04-15 DIAGNOSIS — E559 Vitamin D deficiency, unspecified: Secondary | ICD-10-CM | POA: Diagnosis not present

## 2012-04-15 DIAGNOSIS — E119 Type 2 diabetes mellitus without complications: Secondary | ICD-10-CM | POA: Diagnosis not present

## 2012-04-15 DIAGNOSIS — N400 Enlarged prostate without lower urinary tract symptoms: Secondary | ICD-10-CM | POA: Diagnosis not present

## 2012-04-15 DIAGNOSIS — E785 Hyperlipidemia, unspecified: Secondary | ICD-10-CM | POA: Diagnosis not present

## 2012-04-15 DIAGNOSIS — E1159 Type 2 diabetes mellitus with other circulatory complications: Secondary | ICD-10-CM | POA: Diagnosis not present

## 2012-04-15 DIAGNOSIS — I10 Essential (primary) hypertension: Secondary | ICD-10-CM | POA: Diagnosis not present

## 2012-04-19 DIAGNOSIS — M9981 Other biomechanical lesions of cervical region: Secondary | ICD-10-CM | POA: Diagnosis not present

## 2012-04-19 DIAGNOSIS — E119 Type 2 diabetes mellitus without complications: Secondary | ICD-10-CM | POA: Diagnosis not present

## 2012-04-19 DIAGNOSIS — Z1212 Encounter for screening for malignant neoplasm of rectum: Secondary | ICD-10-CM | POA: Diagnosis not present

## 2012-04-19 DIAGNOSIS — M999 Biomechanical lesion, unspecified: Secondary | ICD-10-CM | POA: Diagnosis not present

## 2012-04-19 DIAGNOSIS — M5137 Other intervertebral disc degeneration, lumbosacral region: Secondary | ICD-10-CM | POA: Diagnosis not present

## 2012-05-03 DIAGNOSIS — M9981 Other biomechanical lesions of cervical region: Secondary | ICD-10-CM | POA: Diagnosis not present

## 2012-05-03 DIAGNOSIS — M999 Biomechanical lesion, unspecified: Secondary | ICD-10-CM | POA: Diagnosis not present

## 2012-05-03 DIAGNOSIS — M5137 Other intervertebral disc degeneration, lumbosacral region: Secondary | ICD-10-CM | POA: Diagnosis not present

## 2012-05-13 ENCOUNTER — Encounter: Payer: Self-pay | Admitting: Cardiology

## 2012-05-13 ENCOUNTER — Ambulatory Visit (INDEPENDENT_AMBULATORY_CARE_PROVIDER_SITE_OTHER): Payer: Medicare Other | Admitting: Cardiology

## 2012-05-13 VITALS — BP 124/72 | HR 76 | Ht 68.0 in | Wt 249.0 lb

## 2012-05-13 DIAGNOSIS — I10 Essential (primary) hypertension: Secondary | ICD-10-CM

## 2012-05-13 DIAGNOSIS — E785 Hyperlipidemia, unspecified: Secondary | ICD-10-CM | POA: Diagnosis not present

## 2012-05-13 DIAGNOSIS — I2510 Atherosclerotic heart disease of native coronary artery without angina pectoris: Secondary | ICD-10-CM

## 2012-05-13 DIAGNOSIS — E1121 Type 2 diabetes mellitus with diabetic nephropathy: Secondary | ICD-10-CM

## 2012-05-13 DIAGNOSIS — N058 Unspecified nephritic syndrome with other morphologic changes: Secondary | ICD-10-CM

## 2012-05-13 DIAGNOSIS — N189 Chronic kidney disease, unspecified: Secondary | ICD-10-CM

## 2012-05-13 DIAGNOSIS — E1129 Type 2 diabetes mellitus with other diabetic kidney complication: Secondary | ICD-10-CM | POA: Diagnosis not present

## 2012-05-13 MED ORDER — NITROGLYCERIN 0.4 MG SL SUBL: 0.4000 mg | SUBLINGUAL_TABLET | SUBLINGUAL | Status: DC | PRN

## 2012-05-13 NOTE — Assessment & Plan Note (Signed)
Stable. Continue secondary preventative therapy.

## 2012-05-13 NOTE — Progress Notes (Signed)
HPI [name] returns today for evaluation and management his coronary artery disease. He also has nonobstructive carotid disease.  He's doing remarkably well. He denies any angina or exertional symptoms other than dyspnea because of difficulty in walking.  Blood work being followed by primary care.  He has had some discomfort at night when he lies down. He responded to nitroglycerin. This was about 9 months ago. Is now resolved. It really sounds like potential reflux.  Past Medical History  Diagnosis Date  . CAD (coronary artery disease)     native vessel  . Diabetes mellitus type II   . Hyperlipidemia   . HTN (hypertension)   . Chronic renal insufficiency   . Carotid artery disease     nonobstructive  . Anemia   . Hypokalemia   . Malnutrition     protein-calorie  . PTSD (post-traumatic stress disorder)     Current Outpatient Prescriptions  Medication Sig Dispense Refill  . Ascorbic Acid (VITAMIN C) 1000 MG tablet Take 1,000 mg by mouth daily.        Marland Kitchen aspirin 81 MG tablet Take 81 mg by mouth daily.        Marland Kitchen buPROPion (WELLBUTRIN XL) 300 MG 24 hr tablet Take 300 mg by mouth every morning.        . Cholecalciferol (VITAMIN D-3) 5000 UNITS TABS Take 1 tablet by mouth daily.        . Coenzyme Q10 (CO Q-10) 100 MG CAPS Take 1 capsule by mouth 2 (two) times daily.       Marland Kitchen doxycycline (VIBRAMYCIN) 100 MG capsule Take 100 mg by mouth daily.        Marland Kitchen ezetimibe-simvastatin (VYTORIN) 10-20 MG per tablet Take 1 tablet by mouth at bedtime.        Marland Kitchen glipiZIDE (GLUCOTROL XL) 10 MG 24 hr tablet Take 10 mg by mouth daily.        . Lutein 40 MG CAPS Take 1 capsule by mouth daily.        . metoprolol tartrate (LOPRESSOR) 25 MG tablet TAKE 1 TABLET BY MOUTH TWICE DAILY  180 tablet  2  . NON FORMULARY cran-actin 100mg  1 tablet daily      . Omega-3 Fatty Acids (FISH OIL) 1000 MG CAPS Take 1 capsule by mouth 2 (two) times daily.        . valsartan (DIOVAN) 80 MG tablet Take 1 tablet (80 mg total)  by mouth daily.  90 tablet  3  . nitroGLYCERIN (NITROSTAT) 0.4 MG SL tablet Place 1 tablet (0.4 mg total) under the tongue every 5 (five) minutes as needed.  25 tablet  6   No current facility-administered medications for this visit.    Allergies  Allergen Reactions  . Nitrofurantoin   . Sulfonamide Derivatives     REACTION: pruitis    No family history on file.  History   Social History  . Marital Status: Married    Spouse Name: N/A    Number of Children: N/A  . Years of Education: N/A   Occupational History  . Retired    Social History Main Topics  . Smoking status: Former Research scientist (life sciences)  . Smokeless tobacco: Not on file  . Alcohol Use: No  . Drug Use: No  . Sexually Active: Not on file   Other Topics Concern  . Not on file   Social History Narrative  . No narrative on file    ROS ALL NEGATIVE EXCEPT THOSE NOTED IN HPI  PE  General Appearance: well developed, well nourished in no acute distress, using a walker, overweight HEENT: symmetrical face, PERRLA, good dentition  Neck: no JVD, thyromegaly, or adenopathy, trachea midline Chest: symmetric without deformity Cardiac: PMI non-displaced, RRR, normal S1, S2, no gallop or murmur Lung: clear to ausculation and percussion Vascular: all pulses full without bruits  Abdominal: nondistended, nontender, good bowel sounds, no HSM, no bruits Extremities: no cyanosis, clubbing or edema, no sign of DVT, no varicosities  Skin: normal color, no rashes Neuro: alert and oriented x 3, non-focal Pysch: normal affect  EKG  BMET    Component Value Date/Time   NA 135 09/15/2007 1155   K 4.9 09/15/2007 1155   CL 105 09/15/2007 1155   CO2 24 09/15/2007 1155   GLUCOSE 186* 09/15/2007 1155   BUN 22 09/15/2007 1155   CREATININE 1.84* 09/15/2007 1155   CALCIUM 9.0 09/15/2007 1155   GFRNONAA 36* 09/15/2007 1155   GFRAA  Value: 44        The eGFR has been calculated using the MDRD equation. This calculation has not been validated in all  clinical* 09/15/2007 1155    Lipid Panel     Component Value Date/Time   CHOL 153 03/24/2007 1113   TRIG 97 03/24/2007 1113   HDL 48.6 03/24/2007 1113   CHOLHDL 3.1 CALC 03/24/2007 1113   VLDL 19 03/24/2007 1113   LDLCALC 85 03/24/2007 1113    CBC    Component Value Date/Time   WBC 10.0 11/10/2006 0450   RBC 2.91* 11/10/2006 0450   HGB 10.9 POINT OF CARE RESULT* 09/17/2007 0643   HCT 27.0* 11/10/2006 0450   PLT 322 11/10/2006 0450   MCV 92.7 11/10/2006 0450   MCHC 34.2 11/10/2006 0450   RDW 14.2* 11/10/2006 0450   LYMPHSABS 1.7 11/07/2006 0330   MONOABS 0.8* 11/07/2006 0330   EOSABS 0.1 11/07/2006 0330   BASOSABS 0.0 11/07/2006 0330

## 2012-05-13 NOTE — Patient Instructions (Addendum)
Your physician recommends that you continue on your current medications as directed. Please refer to the Current Medication list given to you today.  Your physician wants you to follow-up in: 1 year with Dr. Harl Bowie. You will receive a reminder letter in the mail two months in advance. If you don't receive a letter, please call our office to schedule the follow-up appointment.

## 2012-05-16 ENCOUNTER — Other Ambulatory Visit: Payer: Self-pay | Admitting: Family Medicine

## 2012-05-17 DIAGNOSIS — M999 Biomechanical lesion, unspecified: Secondary | ICD-10-CM | POA: Diagnosis not present

## 2012-05-17 DIAGNOSIS — M5137 Other intervertebral disc degeneration, lumbosacral region: Secondary | ICD-10-CM | POA: Diagnosis not present

## 2012-05-17 DIAGNOSIS — M9981 Other biomechanical lesions of cervical region: Secondary | ICD-10-CM | POA: Diagnosis not present

## 2012-05-19 DIAGNOSIS — M999 Biomechanical lesion, unspecified: Secondary | ICD-10-CM | POA: Diagnosis not present

## 2012-05-19 DIAGNOSIS — M5137 Other intervertebral disc degeneration, lumbosacral region: Secondary | ICD-10-CM | POA: Diagnosis not present

## 2012-05-19 DIAGNOSIS — M9981 Other biomechanical lesions of cervical region: Secondary | ICD-10-CM | POA: Diagnosis not present

## 2012-05-24 DIAGNOSIS — M999 Biomechanical lesion, unspecified: Secondary | ICD-10-CM | POA: Diagnosis not present

## 2012-05-24 DIAGNOSIS — E119 Type 2 diabetes mellitus without complications: Secondary | ICD-10-CM | POA: Diagnosis not present

## 2012-05-24 DIAGNOSIS — M9981 Other biomechanical lesions of cervical region: Secondary | ICD-10-CM | POA: Diagnosis not present

## 2012-05-24 DIAGNOSIS — I10 Essential (primary) hypertension: Secondary | ICD-10-CM | POA: Diagnosis not present

## 2012-05-24 DIAGNOSIS — M5137 Other intervertebral disc degeneration, lumbosacral region: Secondary | ICD-10-CM | POA: Diagnosis not present

## 2012-05-29 ENCOUNTER — Other Ambulatory Visit: Payer: Self-pay | Admitting: Family Medicine

## 2012-06-02 DIAGNOSIS — M5137 Other intervertebral disc degeneration, lumbosacral region: Secondary | ICD-10-CM | POA: Diagnosis not present

## 2012-06-02 DIAGNOSIS — I10 Essential (primary) hypertension: Secondary | ICD-10-CM | POA: Diagnosis not present

## 2012-06-02 DIAGNOSIS — E119 Type 2 diabetes mellitus without complications: Secondary | ICD-10-CM | POA: Diagnosis not present

## 2012-06-02 DIAGNOSIS — M999 Biomechanical lesion, unspecified: Secondary | ICD-10-CM | POA: Diagnosis not present

## 2012-06-02 DIAGNOSIS — M9981 Other biomechanical lesions of cervical region: Secondary | ICD-10-CM | POA: Diagnosis not present

## 2012-06-07 DIAGNOSIS — I10 Essential (primary) hypertension: Secondary | ICD-10-CM | POA: Diagnosis not present

## 2012-06-07 DIAGNOSIS — M999 Biomechanical lesion, unspecified: Secondary | ICD-10-CM | POA: Diagnosis not present

## 2012-06-07 DIAGNOSIS — M9981 Other biomechanical lesions of cervical region: Secondary | ICD-10-CM | POA: Diagnosis not present

## 2012-06-07 DIAGNOSIS — M5137 Other intervertebral disc degeneration, lumbosacral region: Secondary | ICD-10-CM | POA: Diagnosis not present

## 2012-06-07 DIAGNOSIS — E119 Type 2 diabetes mellitus without complications: Secondary | ICD-10-CM | POA: Diagnosis not present

## 2012-06-14 DIAGNOSIS — I10 Essential (primary) hypertension: Secondary | ICD-10-CM | POA: Diagnosis not present

## 2012-06-14 DIAGNOSIS — E119 Type 2 diabetes mellitus without complications: Secondary | ICD-10-CM | POA: Diagnosis not present

## 2012-06-14 DIAGNOSIS — M9981 Other biomechanical lesions of cervical region: Secondary | ICD-10-CM | POA: Diagnosis not present

## 2012-06-14 DIAGNOSIS — M5137 Other intervertebral disc degeneration, lumbosacral region: Secondary | ICD-10-CM | POA: Diagnosis not present

## 2012-06-14 DIAGNOSIS — M999 Biomechanical lesion, unspecified: Secondary | ICD-10-CM | POA: Diagnosis not present

## 2012-06-24 DIAGNOSIS — E1159 Type 2 diabetes mellitus with other circulatory complications: Secondary | ICD-10-CM | POA: Diagnosis not present

## 2012-06-24 DIAGNOSIS — E119 Type 2 diabetes mellitus without complications: Secondary | ICD-10-CM | POA: Diagnosis not present

## 2012-07-16 ENCOUNTER — Other Ambulatory Visit: Payer: Self-pay | Admitting: Cardiology

## 2012-07-20 DIAGNOSIS — F0390 Unspecified dementia without behavioral disturbance: Secondary | ICD-10-CM | POA: Diagnosis not present

## 2012-08-19 ENCOUNTER — Ambulatory Visit (INDEPENDENT_AMBULATORY_CARE_PROVIDER_SITE_OTHER): Payer: Medicare Other | Admitting: Family Medicine

## 2012-08-19 ENCOUNTER — Ambulatory Visit (INDEPENDENT_AMBULATORY_CARE_PROVIDER_SITE_OTHER): Payer: Medicare Other

## 2012-08-19 ENCOUNTER — Encounter: Payer: Self-pay | Admitting: Family Medicine

## 2012-08-19 VITALS — BP 141/66 | HR 61 | Temp 97.4°F | Ht 68.0 in | Wt 244.8 lb

## 2012-08-19 DIAGNOSIS — I10 Essential (primary) hypertension: Secondary | ICD-10-CM

## 2012-08-19 DIAGNOSIS — I517 Cardiomegaly: Secondary | ICD-10-CM

## 2012-08-19 DIAGNOSIS — E785 Hyperlipidemia, unspecified: Secondary | ICD-10-CM

## 2012-08-19 DIAGNOSIS — E119 Type 2 diabetes mellitus without complications: Secondary | ICD-10-CM | POA: Diagnosis not present

## 2012-08-19 DIAGNOSIS — I7090 Unspecified atherosclerosis: Secondary | ICD-10-CM | POA: Diagnosis not present

## 2012-08-19 DIAGNOSIS — E559 Vitamin D deficiency, unspecified: Secondary | ICD-10-CM | POA: Diagnosis not present

## 2012-08-19 DIAGNOSIS — R5381 Other malaise: Secondary | ICD-10-CM | POA: Diagnosis not present

## 2012-08-19 DIAGNOSIS — R5383 Other fatigue: Secondary | ICD-10-CM

## 2012-08-19 DIAGNOSIS — Q059 Spina bifida, unspecified: Secondary | ICD-10-CM | POA: Diagnosis not present

## 2012-08-19 DIAGNOSIS — N400 Enlarged prostate without lower urinary tract symptoms: Secondary | ICD-10-CM

## 2012-08-19 DIAGNOSIS — I2510 Atherosclerotic heart disease of native coronary artery without angina pectoris: Secondary | ICD-10-CM

## 2012-08-19 DIAGNOSIS — R21 Rash and other nonspecific skin eruption: Secondary | ICD-10-CM

## 2012-08-19 LAB — POCT CBC
HCT, POC: 40.3 % — AB (ref 43.5–53.7)
Hemoglobin: 14.4 g/dL (ref 14.1–18.1)
MCH, POC: 32.3 pg — AB (ref 27–31.2)
MCHC: 35.8 g/dL — AB (ref 31.8–35.4)
POC Granulocyte: 7.1 — AB (ref 2–6.9)
RBC: 4.5 M/uL — AB (ref 4.69–6.13)

## 2012-08-19 LAB — BASIC METABOLIC PANEL WITH GFR
CO2: 24 mEq/L (ref 19–32)
Chloride: 105 mEq/L (ref 96–112)
GFR, Est Non African American: 41 mL/min — ABNORMAL LOW
Potassium: 5 mEq/L (ref 3.5–5.3)
Sodium: 137 mEq/L (ref 135–145)

## 2012-08-19 LAB — THYROID PANEL WITH TSH
Free Thyroxine Index: 2.7 (ref 1.0–3.9)
T4, Total: 6.9 ug/dL (ref 5.0–12.5)
TSH: 3.343 u[IU]/mL (ref 0.350–4.500)

## 2012-08-19 LAB — HEPATIC FUNCTION PANEL
Albumin: 4 g/dL (ref 3.5–5.2)
Alkaline Phosphatase: 61 U/L (ref 39–117)
Total Bilirubin: 0.6 mg/dL (ref 0.3–1.2)
Total Protein: 6.2 g/dL (ref 6.0–8.3)

## 2012-08-19 LAB — POCT GLYCOSYLATED HEMOGLOBIN (HGB A1C): Hemoglobin A1C: 7.3

## 2012-08-19 MED ORDER — NYSTATIN-TRIAMCINOLONE 100000-0.1 UNIT/GM-% EX OINT: TOPICAL_OINTMENT | Freq: Two times a day (BID) | CUTANEOUS | Status: DC

## 2012-08-19 NOTE — Progress Notes (Signed)
Subjective:    Patient ID: [name], male    DOB: 02-07-1931, 79 y.o.   MRN: [id number]  HPI Patient returns to clinic today for followup of chronic medical problems which include hypertension, elevated cholesterol, diabetes mellitus controlled, ASCVD, and spina bifida. Health maintenance issues are up to date. He does not want to take the shingles shot. Also see the review of systems. He has a history of PTSD. He uses a walker because of the spina bifida. His issues related to this had not changed. He does self-catheterization twice daily. Home blood sugars fasting are running between 70-90 . 2 hours after eating it may be as high as 160-170. He is still having issues with breakdown of skin on each buttock where he potentially sits a lot during the day..    Review of Systems  Constitutional: Positive for fatigue (some).  HENT: Positive for postnasal drip.   Eyes: Negative.   Respiratory: Negative.   Cardiovascular: Negative.   Gastrointestinal: Positive for constipation (persistent).  Endocrine: Negative.   Genitourinary: Negative.   Musculoskeletal: Positive for back pain (LBP) and gait problem.  Allergic/Immunologic: Positive for environmental allergies (seasonal).  Neurological: Positive for weakness.  Psychiatric/Behavioral: Positive for sleep disturbance (occasional). The patient is nervous/anxious (PTSD).        Objective:   Physical Exam BP 141/66  Pulse 61  Temp(Src) 97.4 F (36.3 C) (Oral)  Ht 5\' 8"  (1.727 m)  Wt 244 lb 12.8 oz (111.041 kg)  BMI 37.23 kg/m2  The patient appeared well nourished and normally developed for his age, alert and oriented to time and place. Speech, behavior and judgement appear normal. He is using his rolling walker because of the spina bifida. Vital signs as documented.  Head exam is unremarkable. No scleral icterus or pallor noted. Some nasal congestion bilaterally. Throat and ear canals were normal.  Neck is without jugular venous  distension, thyromegally, or carotid bruits. Carotid upstrokes are brisk bilaterally. No cervical adenopathy. Lungs are clear anteriorly and posteriorly to auscultation. Normal respiratory effort. Cardiac exam reveals regular rate and rhythm at 72 per minute. First and second heart sounds normal.  No murmurs, rubs or gallops.  Abdominal exam reveals normal bowl sounds, no masses, no organomegaly and no aortic enlargement. No inguinal adenopathy. Abdomen was obese but nontender. Extremities are nonedematous and both femoral and pedal pulses are normal. He has contractures in his feet and toes. Skin without pallor or jaundice.  Warm and dry, without rash, except for the large bilateral buttock rash. There is erythema and scaling of both medial buttocks around the area of the rectum. Neurologic exam reveals normal deep tendon reflexes and normal sensation. Diabetic foot exam was done today  [facility] reading (PRIMARY) by  Dr. Ferne Coe                                         Assessment & Plan:  1. Hypertension - BASIC METABOLIC PANEL WITH GFR; Standing - DG Chest 2 View; Future - BASIC METABOLIC PANEL WITH GFR  2. Diabetes mellitus, type 2 - POCT glycosylated hemoglobin (Hb A1C); Standing - BASIC METABOLIC PANEL WITH GFR; Standing - POCT glycosylated hemoglobin (Hb 123XX123) - BASIC METABOLIC PANEL WITH GFR  3. Spina bifida  4. BPH (benign prostatic hypertrophy)  5. ASCVD (arteriosclerotic cardiovascular disease)  6. Fatigue - POCT CBC; Standing - Thyroid Panel With TSH - POCT  CBC  7. Vitamin D deficiency - Vitamin D 25 hydroxy; Standing - Vitamin D 25 hydroxy  8. Hyperlipemia - NMR Lipoprofile with Lipids; Standing - Hepatic function panel; Standing - NMR Lipoprofile with Lipids - Hepatic function panel  9. Cardiomegaly - DG Chest 2 View; Future  10. Rash and nonspecific skin eruption, bilateral buttocks -mycologue cream along with Desitin   Patient Instructions   Always be careful and prevent any chance of falling Continue current medications Patient is to let me know specifically about the catheter bag that he wishes to purchase so that he can have more freedom of getting out of the house

## 2012-08-19 NOTE — Patient Instructions (Addendum)
Always be careful and prevent any chance of falling Continue current medications Patient is to let me know specifically about the catheter bag that he wishes to purchase so that he can have more freedom of getting out of the house

## 2012-08-20 DIAGNOSIS — H5010 Unspecified exotropia: Secondary | ICD-10-CM | POA: Diagnosis not present

## 2012-08-20 DIAGNOSIS — H2640 Unspecified secondary cataract: Secondary | ICD-10-CM | POA: Diagnosis not present

## 2012-08-20 DIAGNOSIS — H3530 Unspecified macular degeneration: Secondary | ICD-10-CM | POA: Diagnosis not present

## 2012-08-20 DIAGNOSIS — E119 Type 2 diabetes mellitus without complications: Secondary | ICD-10-CM | POA: Diagnosis not present

## 2012-08-20 LAB — NMR LIPOPROFILE WITH LIPIDS
HDL Particle Number: 34.5 umol/L (ref >=30.5)
HDL-C: 47 mg/dL (ref >=40)
LDL (calc): 60 mg/dL (ref <100)
LDL Particle Number: 1007 nmol/L — ABNORMAL HIGH (ref <1000)
LDL Size: 20.2 nm — ABNORMAL LOW (ref >20.5)
LP-IR Score: 27 (ref <=45)
Small LDL Particle Number: 595 nmol/L — ABNORMAL HIGH (ref <=527)
VLDL Size: 41.9 nm (ref <=46.6)

## 2012-08-20 NOTE — Progress Notes (Signed)
LMOM

## 2012-09-09 DIAGNOSIS — E1149 Type 2 diabetes mellitus with other diabetic neurological complication: Secondary | ICD-10-CM | POA: Diagnosis not present

## 2012-09-09 DIAGNOSIS — L851 Acquired keratosis [keratoderma] palmaris et plantaris: Secondary | ICD-10-CM | POA: Diagnosis not present

## 2012-09-09 DIAGNOSIS — B351 Tinea unguium: Secondary | ICD-10-CM | POA: Diagnosis not present

## 2012-09-20 ENCOUNTER — Telehealth: Payer: Self-pay | Admitting: Family Medicine

## 2012-09-21 ENCOUNTER — Telehealth: Payer: Self-pay | Admitting: Family Medicine

## 2012-09-22 ENCOUNTER — Other Ambulatory Visit: Payer: Self-pay | Admitting: Family Medicine

## 2012-09-23 NOTE — Telephone Encounter (Signed)
Pt does not want brace

## 2012-09-24 ENCOUNTER — Ambulatory Visit (INDEPENDENT_AMBULATORY_CARE_PROVIDER_SITE_OTHER): Payer: Medicare Other | Admitting: Family Medicine

## 2012-09-24 VITALS — BP 130/70 | HR 68 | Temp 97.5°F | Ht 68.0 in | Wt 244.0 lb

## 2012-09-24 DIAGNOSIS — B379 Candidiasis, unspecified: Secondary | ICD-10-CM | POA: Diagnosis not present

## 2012-09-24 DIAGNOSIS — L0291 Cutaneous abscess, unspecified: Secondary | ICD-10-CM | POA: Diagnosis not present

## 2012-09-24 DIAGNOSIS — L0390 Cellulitis, unspecified: Secondary | ICD-10-CM

## 2012-09-24 MED ORDER — NYSTATIN-TRIAMCINOLONE 100000-0.1 UNIT/GM-% EX OINT: TOPICAL_OINTMENT | Freq: Two times a day (BID) | CUTANEOUS | Status: DC

## 2012-09-24 MED ORDER — CEPHALEXIN 500 MG PO CAPS: 500.0000 mg | ORAL_CAPSULE | Freq: Four times a day (QID) | ORAL | Status: DC

## 2012-09-24 MED ORDER — NYSTATIN 100000 UNIT/GM EX POWD: 100000.0000 g | Freq: Three times a day (TID) | CUTANEOUS | Status: DC

## 2012-09-24 MED ORDER — FLUCONAZOLE 150 MG PO TABS: 150.0000 mg | ORAL_TABLET | Freq: Once | ORAL | Status: DC

## 2012-09-24 NOTE — Progress Notes (Signed)
  Subjective:    Patient ID: [name], male    DOB: 04/26/1930, 79 y.o.   MRN: [id number]  HPI This 79 y.o. male presents for evaluation of bilateral sores on buttocks.  Patient has hx of spina Bifida and uses a walker.  He has mobility problems and has to sit a lot.  He does have some urinary Incontinence and has to wear depends and has problems with chronic candidiasis on his peri and buttocks. He has developed a couple of sores on his bilateral buttocks and has been using desitin with mycolog cream And this has been working well but his sores are starting to enlarge and he has more discomfort on his  Buttocks.  He has hx of DM and his last hgbaic was 7.0%.     Review of Systems C/o bilateral buttock sores and rash. No chest pain, SOB, HA, dizziness, vision change, N/V, diarrhea, constipation, dysuria, urinary urgency or frequency, myalgias, arthralgias.     Objective:   Physical Exam Vital signs noted  Pleasant male sitting on walker in NAD.  HEENT - Head atraumatic Normocephalic                Eyes - PERRLA, Conjuctiva - clear Sclera- Clear EOMI                Throat - oropharanx wnl Respiratory - Lungs CTA bilateral Cardiac - RRR S1 and S2 without murmur Skin - Bilateral buttocks, perineum, and legs with erythematous rash.  Bilateral buttocks with maceration and quarter Size wounds on buttocks.  No drainage noted from wounds.       Assessment & Plan:  Candida albicans infection - Plan: fluconazole (DIFLUCAN) 150 MG tablet, nystatin (MYCOSTATIN/NYSTOP) 100000 UNIT/GM POWD, nystatin-triamcinolone ointment (MYCOLOG), Ambulatory referral to [place], cephALEXin (KEFLEX) 500 MG capsule  Cellulitis - Plan: nystatin-triamcinolone ointment (MYCOLOG), Ambulatory referral to [facility], cephALEXin (KEFLEX) 500 MG capsule  Plan on tx with abx's for any superimposed cellulitis and with diflucan to tx candidiasis.  Introduce powder with mycolog and desitin And see if we  can have home health nurse come out and do wound care.  Follow up prn if sx's worsen or continue.

## 2012-09-24 NOTE — Patient Instructions (Signed)
Candida Infection, Adult A candida infection (also called yeast, fungus and Monilia infection) is an overgrowth of yeast that can occur anywhere on the body. A yeast infection commonly occurs in warm, moist body areas. Usually, the infection remains localized but can spread to become a systemic infection. A yeast infection may be a sign of a more severe disease such as diabetes, leukemia, or AIDS. A yeast infection can occur in both men and women. In women, Candida vaginitis is a vaginal infection. It is one of the most common causes of vaginitis. Men usually do not have symptoms or know they have an infection until other problems develop. Men may find out they have a yeast infection because their sex partner has a yeast infection. Uncircumcised men are more likely to get a yeast infection than circumcised men. This is because the uncircumcised glans is not exposed to air and does not remain as dry as that of a circumcised glans. Older adults may develop yeast infections around dentures. CAUSES  Women  Antibiotics.  Steroid medication taken for a long time.  Being overweight (obese).  Diabetes.  Poor immune condition.  Certain serious medical conditions.  Immune suppressive medications for organ transplant patients.  Chemotherapy.  Pregnancy.  Menstration.  Stress and fatigue.  Intravenous drug use.  Oral contraceptives.  Wearing tight-fitting clothes in the crotch area.  Catching it from a sex partner who has a yeast infection.  Spermicide.  Intravenous, urinary, or other catheters. Men  Catching it from a sex partner who has a yeast infection.  Having oral or anal sex with a person who has the infection.  Spermicide.  Diabetes.  Antibiotics.  Poor immune system.  Medications that suppress the immune system.  Intravenous drug use.  Intravenous, urinary, or other catheters. SYMPTOMS  Women  Thick, white vaginal discharge.  Vaginal itching.  Redness and  swelling in and around the vagina.  Irritation of the lips of the vagina and perineum.  Blisters on the vaginal lips and perineum.  Painful sexual intercourse.  Low blood sugar (hypoglycemia).  Painful urination.  Bladder infections.  Intestinal problems such as constipation, indigestion, bad breath, bloating, increase in gas, diarrhea, or loose stools. Men  Men may develop intestinal problems such as constipation, indigestion, bad breath, bloating, increase in gas, diarrhea, or loose stools.  Dry, cracked skin on the penis with itching or discomfort.  Jock itch.  Dry, flaky skin.  Athlete's foot.  Hypoglycemia. DIAGNOSIS  Women  A history and an exam are performed.  The discharge may be examined under a microscope.  A culture may be taken of the discharge. Men  A history and an exam are performed.  Any discharge from the penis or areas of cracked skin will be looked at under the microscope and cultured.  Stool samples may be cultured. TREATMENT  Women  Vaginal antifungal suppositories and creams.  Medicated creams to decrease irritation and itching on the outside of the vagina.  Warm compresses to the perineal area to decrease swelling and discomfort.  Oral antifungal medications.  Medicated vaginal suppositories or cream for repeated or recurrent infections.  Wash and dry the irritation areas before applying the cream.  Eating yogurt with lactobacillus may help with prevention and treatment.  Sometimes painting the vagina with gentian violet solution may help if creams and suppositories do not work. Men  Antifungal creams and oral antifungal medications.  Sometimes treatment must continue for 30 days after the symptoms go away to prevent recurrence. HOME CARE  INSTRUCTIONS  Women  Use cotton underwear and avoid tight-fitting clothing.  Avoid colored, scented toilet paper and deodorant tampons or pads.  Do not douche.  Keep your diabetes  under control.  Finish all the prescribed medications.  Keep your skin clean and dry.  Consume milk or yogurt with lactobacillus active culture regularly. If you get frequent yeast infections and think that is what the infection is, there are over-the-counter medications that you can get. If the infection does not show healing in 3 days, talk to your caregiver.  Tell your sex partner you have a yeast infection. Your partner may need treatment also, especially if your infection does not clear up or recurs. Men  Keep your skin clean and dry.  Keep your diabetes under control.  Finish all prescribed medications.  Tell your sex partner that you have a yeast infection so they can be treated if necessary. SEEK MEDICAL CARE IF:   Your symptoms do not clear up or worsen in one week after treatment.  You have an oral temperature above 102 F (38.9 C).  You have trouble swallowing or eating for a prolonged time.  You develop blisters on and around your vagina.  You develop vaginal bleeding and it is not your menstrual period.  You develop abdominal pain.  You develop intestinal problems as mentioned above.  You get weak or lightheaded.  You have painful or increased urination.  You have pain during sexual intercourse. MAKE SURE YOU:   Understand these instructions.  Will watch your condition.  Will get help right away if you are not doing well or get worse. Document Released: 03/20/2004 Document Revised: 05/05/2011 Document Reviewed: 07/02/2009 [facility] Patient Information 2014 [place].

## 2012-09-25 DIAGNOSIS — L0231 Cutaneous abscess of buttock: Secondary | ICD-10-CM | POA: Diagnosis not present

## 2012-09-25 DIAGNOSIS — I2510 Atherosclerotic heart disease of native coronary artery without angina pectoris: Secondary | ICD-10-CM | POA: Diagnosis not present

## 2012-09-25 DIAGNOSIS — R32 Unspecified urinary incontinence: Secondary | ICD-10-CM | POA: Diagnosis not present

## 2012-09-25 DIAGNOSIS — E1142 Type 2 diabetes mellitus with diabetic polyneuropathy: Secondary | ICD-10-CM | POA: Diagnosis not present

## 2012-09-25 DIAGNOSIS — E1149 Type 2 diabetes mellitus with other diabetic neurological complication: Secondary | ICD-10-CM | POA: Diagnosis not present

## 2012-09-25 DIAGNOSIS — I10 Essential (primary) hypertension: Secondary | ICD-10-CM | POA: Diagnosis not present

## 2012-09-25 DIAGNOSIS — D649 Anemia, unspecified: Secondary | ICD-10-CM | POA: Diagnosis not present

## 2012-09-28 DIAGNOSIS — I2510 Atherosclerotic heart disease of native coronary artery without angina pectoris: Secondary | ICD-10-CM | POA: Diagnosis not present

## 2012-09-28 DIAGNOSIS — D649 Anemia, unspecified: Secondary | ICD-10-CM | POA: Diagnosis not present

## 2012-09-28 DIAGNOSIS — L03317 Cellulitis of buttock: Secondary | ICD-10-CM | POA: Diagnosis not present

## 2012-09-28 DIAGNOSIS — I10 Essential (primary) hypertension: Secondary | ICD-10-CM | POA: Diagnosis not present

## 2012-09-28 DIAGNOSIS — E1142 Type 2 diabetes mellitus with diabetic polyneuropathy: Secondary | ICD-10-CM | POA: Diagnosis not present

## 2012-09-28 DIAGNOSIS — E1149 Type 2 diabetes mellitus with other diabetic neurological complication: Secondary | ICD-10-CM | POA: Diagnosis not present

## 2012-10-01 DIAGNOSIS — E1149 Type 2 diabetes mellitus with other diabetic neurological complication: Secondary | ICD-10-CM | POA: Diagnosis not present

## 2012-10-01 DIAGNOSIS — E1142 Type 2 diabetes mellitus with diabetic polyneuropathy: Secondary | ICD-10-CM | POA: Diagnosis not present

## 2012-10-01 DIAGNOSIS — L0231 Cutaneous abscess of buttock: Secondary | ICD-10-CM | POA: Diagnosis not present

## 2012-10-01 DIAGNOSIS — I10 Essential (primary) hypertension: Secondary | ICD-10-CM | POA: Diagnosis not present

## 2012-10-01 DIAGNOSIS — I2510 Atherosclerotic heart disease of native coronary artery without angina pectoris: Secondary | ICD-10-CM | POA: Diagnosis not present

## 2012-10-01 DIAGNOSIS — D649 Anemia, unspecified: Secondary | ICD-10-CM | POA: Diagnosis not present

## 2012-10-06 DIAGNOSIS — I2510 Atherosclerotic heart disease of native coronary artery without angina pectoris: Secondary | ICD-10-CM | POA: Diagnosis not present

## 2012-10-06 DIAGNOSIS — L0231 Cutaneous abscess of buttock: Secondary | ICD-10-CM | POA: Diagnosis not present

## 2012-10-06 DIAGNOSIS — E1149 Type 2 diabetes mellitus with other diabetic neurological complication: Secondary | ICD-10-CM | POA: Diagnosis not present

## 2012-10-06 DIAGNOSIS — E1142 Type 2 diabetes mellitus with diabetic polyneuropathy: Secondary | ICD-10-CM | POA: Diagnosis not present

## 2012-10-06 DIAGNOSIS — I10 Essential (primary) hypertension: Secondary | ICD-10-CM | POA: Diagnosis not present

## 2012-10-06 DIAGNOSIS — D649 Anemia, unspecified: Secondary | ICD-10-CM | POA: Diagnosis not present

## 2012-10-12 DIAGNOSIS — E1142 Type 2 diabetes mellitus with diabetic polyneuropathy: Secondary | ICD-10-CM | POA: Diagnosis not present

## 2012-10-12 DIAGNOSIS — L0231 Cutaneous abscess of buttock: Secondary | ICD-10-CM | POA: Diagnosis not present

## 2012-10-12 DIAGNOSIS — D649 Anemia, unspecified: Secondary | ICD-10-CM | POA: Diagnosis not present

## 2012-10-12 DIAGNOSIS — I10 Essential (primary) hypertension: Secondary | ICD-10-CM | POA: Diagnosis not present

## 2012-10-12 DIAGNOSIS — I2510 Atherosclerotic heart disease of native coronary artery without angina pectoris: Secondary | ICD-10-CM | POA: Diagnosis not present

## 2012-10-12 DIAGNOSIS — E1149 Type 2 diabetes mellitus with other diabetic neurological complication: Secondary | ICD-10-CM | POA: Diagnosis not present

## 2012-10-18 ENCOUNTER — Telehealth: Payer: Self-pay

## 2012-10-18 DIAGNOSIS — D649 Anemia, unspecified: Secondary | ICD-10-CM | POA: Diagnosis not present

## 2012-10-18 DIAGNOSIS — L0231 Cutaneous abscess of buttock: Secondary | ICD-10-CM | POA: Diagnosis not present

## 2012-10-18 DIAGNOSIS — E1142 Type 2 diabetes mellitus with diabetic polyneuropathy: Secondary | ICD-10-CM | POA: Diagnosis not present

## 2012-10-18 DIAGNOSIS — I10 Essential (primary) hypertension: Secondary | ICD-10-CM | POA: Diagnosis not present

## 2012-10-18 DIAGNOSIS — I2510 Atherosclerotic heart disease of native coronary artery without angina pectoris: Secondary | ICD-10-CM | POA: Diagnosis not present

## 2012-10-18 DIAGNOSIS — E1149 Type 2 diabetes mellitus with other diabetic neurological complication: Secondary | ICD-10-CM | POA: Diagnosis not present

## 2012-10-18 NOTE — Telephone Encounter (Signed)
Please do a wound care consult if that can be done at home. And he can't hold his blood pressure medicine today. He must continue to monitor his blood pressure regularly.

## 2012-10-18 NOTE — Telephone Encounter (Signed)
Yes you may send him to the wound center

## 2012-10-18 NOTE — Telephone Encounter (Signed)
[facility] patient today  He hasn't taken his BP meds and his BP is 100/50  HR is 62  Do you want him to hold his BP med today?  Also was being seen for yeast infection on buttock which is a lot better but has 2 sores on his legs which are increasing in size   Do you want a wound care consult?

## 2012-10-18 NOTE — Telephone Encounter (Signed)
The have been doing wound care at his home, but it is not geeting any better they were wondering if you wanted to send him to the wound care center

## 2012-10-22 ENCOUNTER — Ambulatory Visit (INDEPENDENT_AMBULATORY_CARE_PROVIDER_SITE_OTHER): Payer: Medicare Other | Admitting: Family Medicine

## 2012-10-22 VITALS — BP 130/76 | HR 66 | Temp 97.8°F | Ht 68.0 in | Wt 244.0 lb

## 2012-10-22 DIAGNOSIS — L8992 Pressure ulcer of unspecified site, stage 2: Secondary | ICD-10-CM | POA: Diagnosis not present

## 2012-10-22 DIAGNOSIS — R300 Dysuria: Secondary | ICD-10-CM | POA: Diagnosis not present

## 2012-10-22 DIAGNOSIS — L8990 Pressure ulcer of unspecified site, unspecified stage: Secondary | ICD-10-CM | POA: Diagnosis not present

## 2012-10-22 LAB — POCT UA - MICROSCOPIC ONLY
Casts, Ur, LPF, POC: NEGATIVE
Crystals, Ur, HPF, POC: NEGATIVE
Mucus, UA: NEGATIVE
Yeast, UA: NEGATIVE

## 2012-10-22 LAB — POCT URINALYSIS DIPSTICK
Bilirubin, UA: NEGATIVE
Glucose, UA: NEGATIVE
Ketones, UA: NEGATIVE
Nitrite, UA: POSITIVE
Protein, UA: NEGATIVE
Spec Grav, UA: 1.015
Urobilinogen, UA: NEGATIVE
pH, UA: 6

## 2012-10-22 MED ORDER — LEVOFLOXACIN 500 MG PO TABS: 500.0000 mg | ORAL_TABLET | Freq: Every day | ORAL | Status: DC

## 2012-10-22 NOTE — Progress Notes (Signed)
  Subjective:    Patient ID: [name], male    DOB: 10-16-1930, 79 y.o.   MRN: [id number]  HPI  This 79 y.o. male presents for evaluation of dysuria and worsening wounds on his buttocks. He has been taking diflucan and keflex for decubitus wounds on his buttocks.  He has been Having difficulty with candidiasis and he had quarter size wounds in the dermis but only a few Layers and was put on more nystatin cream and home health wound care was ordered but it Is not improving.  He is having some discomfort in his buttocks.  He has been having difficulty With dysuria over the last few days.  He does take doxycycline for prophylaxis of UTI's.  Review of Systems C/o of buttock pain, skin ulcers, and dysuria    No chest pain, SOB, HA, dizziness, vision change, N/V, diarrhea, constipation,  myalgias, arthralgias or rash.  Objective:   Physical Exam  Vital signs noted  Well developed well nourished male using walker.  HEENT - Head atraumatic Normocephalic                Eyes - PERRLA, Conjuctiva - clear Sclera- Clear EOMI                Ears - EAC's Wnl TM's Wnl Gross Hearing WN                Throat - oropharanx wnl Respiratory - Lungs CTA bilateral Cardiac - RRR S1 and S2 without murmur Skin - STage 2 ducubitus ulcers bilateral buttocks measuring the size of silver dollars.      Assessment & Plan:  Dysuria - Plan: POCT urinalysis dipstick, POCT UA - Microscopic Only, levofloxacin (LEVAQUIN) 500 MG tablet, CANCELED: AMB referral to wound care center.  UA cx  Decubital ulcer, stage II - Plan: AMB referral to wound care center, levofloxacin (LEVAQUIN) 500 MG tablet.  Instructed patient to stop keflex and doxycycline and start levaquin.  Continue with current wound Care until seen at the wound clinic.

## 2012-10-22 NOTE — Patient Instructions (Signed)
Pressure Ulcer A pressure ulcer is a sore where the skin breaks down and exposes deeper layers of tissue. It develops in areas of the body where there is unrelieved pressure. Pressure ulcers are usually found over a boney area, such as the shoulder blades, spine, lower back, hips, knees, ankles, and heels. CAUSES   Decreased ability to move.  Decreased ability to feel pain or discomfort.  Moisture from urine or stool.  Poor nutrition.  Pulling sheets that are under a patient when changing his or her position. HOME CARE INSTRUCTIONS  Follow the care plan that was started in the hospital.  Avoid staying in the same position for more than 2 hours. Use padding, devices, or mattresses to cushion your pressure points as directed by your caregiver.  Eat well. Take nutritional supplements and vitamins as directed by your caregiver.  Keep all follow-up appointments.  Take pain medicine as directed by your caregiver. SEEK MEDICAL CARE IF:   Your pressure ulcer is not improving.  You do not know how to care for your pressure ulcer.  You notice other areas of redness on your skin. SEEK IMMEDIATE MEDICAL CARE IF:   You have increasing redness, swelling, or pain in your pressure ulcer.  You notice pus, or increased pus, coming from your pressure ulcer.  You have a fever.  You notice a bad smell coming from the wound or dressing.  Your pressure ulcer opens up again. MAKE SURE YOU:   Understand these instructions.  Will watch your condition.  Will get help right away if you are not doing well or get worse. Document Released: 02/10/2005 Document Revised: 05/05/2011 Document Reviewed: 09/27/2010 [facility] Patient Information 2014 [place], [place].

## 2012-10-24 DIAGNOSIS — E1142 Type 2 diabetes mellitus with diabetic polyneuropathy: Secondary | ICD-10-CM | POA: Diagnosis not present

## 2012-10-24 DIAGNOSIS — L03317 Cellulitis of buttock: Secondary | ICD-10-CM

## 2012-10-24 DIAGNOSIS — L0231 Cutaneous abscess of buttock: Secondary | ICD-10-CM | POA: Diagnosis not present

## 2012-10-24 DIAGNOSIS — E1149 Type 2 diabetes mellitus with other diabetic neurological complication: Secondary | ICD-10-CM

## 2012-10-24 LAB — URINE CULTURE

## 2012-10-26 ENCOUNTER — Telehealth: Payer: Self-pay | Admitting: Family Medicine

## 2012-10-27 DIAGNOSIS — D649 Anemia, unspecified: Secondary | ICD-10-CM | POA: Diagnosis not present

## 2012-10-27 DIAGNOSIS — I2510 Atherosclerotic heart disease of native coronary artery without angina pectoris: Secondary | ICD-10-CM | POA: Diagnosis not present

## 2012-10-27 DIAGNOSIS — L0231 Cutaneous abscess of buttock: Secondary | ICD-10-CM | POA: Diagnosis not present

## 2012-10-27 DIAGNOSIS — I10 Essential (primary) hypertension: Secondary | ICD-10-CM | POA: Diagnosis not present

## 2012-10-27 DIAGNOSIS — E1149 Type 2 diabetes mellitus with other diabetic neurological complication: Secondary | ICD-10-CM | POA: Diagnosis not present

## 2012-10-27 DIAGNOSIS — E1142 Type 2 diabetes mellitus with diabetic polyneuropathy: Secondary | ICD-10-CM | POA: Diagnosis not present

## 2012-10-29 ENCOUNTER — Other Ambulatory Visit: Payer: Self-pay | Admitting: Cardiology

## 2012-11-01 DIAGNOSIS — I2510 Atherosclerotic heart disease of native coronary artery without angina pectoris: Secondary | ICD-10-CM | POA: Diagnosis not present

## 2012-11-01 DIAGNOSIS — I10 Essential (primary) hypertension: Secondary | ICD-10-CM | POA: Diagnosis not present

## 2012-11-01 DIAGNOSIS — L0231 Cutaneous abscess of buttock: Secondary | ICD-10-CM | POA: Diagnosis not present

## 2012-11-01 DIAGNOSIS — D649 Anemia, unspecified: Secondary | ICD-10-CM | POA: Diagnosis not present

## 2012-11-01 DIAGNOSIS — E1142 Type 2 diabetes mellitus with diabetic polyneuropathy: Secondary | ICD-10-CM | POA: Diagnosis not present

## 2012-11-01 DIAGNOSIS — E1149 Type 2 diabetes mellitus with other diabetic neurological complication: Secondary | ICD-10-CM | POA: Diagnosis not present

## 2012-11-04 DIAGNOSIS — I10 Essential (primary) hypertension: Secondary | ICD-10-CM | POA: Diagnosis not present

## 2012-11-04 DIAGNOSIS — L259 Unspecified contact dermatitis, unspecified cause: Secondary | ICD-10-CM | POA: Diagnosis not present

## 2012-11-04 DIAGNOSIS — E119 Type 2 diabetes mellitus without complications: Secondary | ICD-10-CM | POA: Diagnosis not present

## 2012-11-04 DIAGNOSIS — Z79899 Other long term (current) drug therapy: Secondary | ICD-10-CM | POA: Diagnosis not present

## 2012-11-04 DIAGNOSIS — L98499 Non-pressure chronic ulcer of skin of other sites with unspecified severity: Secondary | ICD-10-CM | POA: Diagnosis not present

## 2012-11-04 DIAGNOSIS — K644 Residual hemorrhoidal skin tags: Secondary | ICD-10-CM | POA: Diagnosis not present

## 2012-11-04 DIAGNOSIS — L8992 Pressure ulcer of unspecified site, stage 2: Secondary | ICD-10-CM | POA: Diagnosis not present

## 2012-11-04 DIAGNOSIS — L89309 Pressure ulcer of unspecified buttock, unspecified stage: Secondary | ICD-10-CM | POA: Diagnosis not present

## 2012-11-04 DIAGNOSIS — Z7982 Long term (current) use of aspirin: Secondary | ICD-10-CM | POA: Diagnosis not present

## 2012-11-06 DIAGNOSIS — L0231 Cutaneous abscess of buttock: Secondary | ICD-10-CM | POA: Diagnosis not present

## 2012-11-06 DIAGNOSIS — I10 Essential (primary) hypertension: Secondary | ICD-10-CM | POA: Diagnosis not present

## 2012-11-06 DIAGNOSIS — D649 Anemia, unspecified: Secondary | ICD-10-CM | POA: Diagnosis not present

## 2012-11-06 DIAGNOSIS — I2510 Atherosclerotic heart disease of native coronary artery without angina pectoris: Secondary | ICD-10-CM | POA: Diagnosis not present

## 2012-11-06 DIAGNOSIS — E1142 Type 2 diabetes mellitus with diabetic polyneuropathy: Secondary | ICD-10-CM | POA: Diagnosis not present

## 2012-11-06 DIAGNOSIS — E1149 Type 2 diabetes mellitus with other diabetic neurological complication: Secondary | ICD-10-CM | POA: Diagnosis not present

## 2012-11-09 DIAGNOSIS — E1142 Type 2 diabetes mellitus with diabetic polyneuropathy: Secondary | ICD-10-CM | POA: Diagnosis not present

## 2012-11-09 DIAGNOSIS — I2510 Atherosclerotic heart disease of native coronary artery without angina pectoris: Secondary | ICD-10-CM | POA: Diagnosis not present

## 2012-11-09 DIAGNOSIS — E1149 Type 2 diabetes mellitus with other diabetic neurological complication: Secondary | ICD-10-CM | POA: Diagnosis not present

## 2012-11-09 DIAGNOSIS — I10 Essential (primary) hypertension: Secondary | ICD-10-CM | POA: Diagnosis not present

## 2012-11-09 DIAGNOSIS — D649 Anemia, unspecified: Secondary | ICD-10-CM | POA: Diagnosis not present

## 2012-11-09 DIAGNOSIS — L0231 Cutaneous abscess of buttock: Secondary | ICD-10-CM | POA: Diagnosis not present

## 2012-11-16 DIAGNOSIS — E1149 Type 2 diabetes mellitus with other diabetic neurological complication: Secondary | ICD-10-CM | POA: Diagnosis not present

## 2012-11-16 DIAGNOSIS — B351 Tinea unguium: Secondary | ICD-10-CM | POA: Diagnosis not present

## 2012-11-16 DIAGNOSIS — L89309 Pressure ulcer of unspecified buttock, unspecified stage: Secondary | ICD-10-CM | POA: Diagnosis not present

## 2012-11-16 DIAGNOSIS — E119 Type 2 diabetes mellitus without complications: Secondary | ICD-10-CM | POA: Diagnosis not present

## 2012-11-16 DIAGNOSIS — L8992 Pressure ulcer of unspecified site, stage 2: Secondary | ICD-10-CM | POA: Diagnosis not present

## 2012-11-16 DIAGNOSIS — K644 Residual hemorrhoidal skin tags: Secondary | ICD-10-CM | POA: Diagnosis not present

## 2012-11-16 DIAGNOSIS — I10 Essential (primary) hypertension: Secondary | ICD-10-CM | POA: Diagnosis not present

## 2012-11-16 DIAGNOSIS — L851 Acquired keratosis [keratoderma] palmaris et plantaris: Secondary | ICD-10-CM | POA: Diagnosis not present

## 2012-11-16 DIAGNOSIS — L98499 Non-pressure chronic ulcer of skin of other sites with unspecified severity: Secondary | ICD-10-CM | POA: Diagnosis not present

## 2012-11-16 DIAGNOSIS — L259 Unspecified contact dermatitis, unspecified cause: Secondary | ICD-10-CM | POA: Diagnosis not present

## 2012-11-16 DIAGNOSIS — Z79899 Other long term (current) drug therapy: Secondary | ICD-10-CM | POA: Diagnosis not present

## 2012-11-17 DIAGNOSIS — L0231 Cutaneous abscess of buttock: Secondary | ICD-10-CM | POA: Diagnosis not present

## 2012-11-17 DIAGNOSIS — D649 Anemia, unspecified: Secondary | ICD-10-CM | POA: Diagnosis not present

## 2012-11-17 DIAGNOSIS — E1149 Type 2 diabetes mellitus with other diabetic neurological complication: Secondary | ICD-10-CM | POA: Diagnosis not present

## 2012-11-17 DIAGNOSIS — I10 Essential (primary) hypertension: Secondary | ICD-10-CM | POA: Diagnosis not present

## 2012-11-17 DIAGNOSIS — E1142 Type 2 diabetes mellitus with diabetic polyneuropathy: Secondary | ICD-10-CM | POA: Diagnosis not present

## 2012-11-17 DIAGNOSIS — I2510 Atherosclerotic heart disease of native coronary artery without angina pectoris: Secondary | ICD-10-CM | POA: Diagnosis not present

## 2012-11-18 ENCOUNTER — Ambulatory Visit (INDEPENDENT_AMBULATORY_CARE_PROVIDER_SITE_OTHER): Payer: Medicare Other | Admitting: Family Medicine

## 2012-11-18 ENCOUNTER — Encounter: Payer: Self-pay | Admitting: Family Medicine

## 2012-11-18 VITALS — BP 136/69 | HR 67 | Temp 97.4°F | Ht 68.0 in | Wt 237.0 lb

## 2012-11-18 DIAGNOSIS — D649 Anemia, unspecified: Secondary | ICD-10-CM | POA: Diagnosis not present

## 2012-11-18 DIAGNOSIS — Z125 Encounter for screening for malignant neoplasm of prostate: Secondary | ICD-10-CM | POA: Diagnosis not present

## 2012-11-18 DIAGNOSIS — N529 Male erectile dysfunction, unspecified: Secondary | ICD-10-CM | POA: Diagnosis not present

## 2012-11-18 DIAGNOSIS — E785 Hyperlipidemia, unspecified: Secondary | ICD-10-CM | POA: Diagnosis not present

## 2012-11-18 DIAGNOSIS — Q059 Spina bifida, unspecified: Secondary | ICD-10-CM | POA: Insufficient documentation

## 2012-11-18 DIAGNOSIS — N390 Urinary tract infection, site not specified: Secondary | ICD-10-CM

## 2012-11-18 DIAGNOSIS — I10 Essential (primary) hypertension: Secondary | ICD-10-CM | POA: Diagnosis not present

## 2012-11-18 DIAGNOSIS — N058 Unspecified nephritic syndrome with other morphologic changes: Secondary | ICD-10-CM

## 2012-11-18 DIAGNOSIS — L8992 Pressure ulcer of unspecified site, stage 2: Secondary | ICD-10-CM

## 2012-11-18 DIAGNOSIS — E559 Vitamin D deficiency, unspecified: Secondary | ICD-10-CM

## 2012-11-18 DIAGNOSIS — E1129 Type 2 diabetes mellitus with other diabetic kidney complication: Secondary | ICD-10-CM | POA: Diagnosis not present

## 2012-11-18 DIAGNOSIS — K649 Unspecified hemorrhoids: Secondary | ICD-10-CM

## 2012-11-18 DIAGNOSIS — Z23 Encounter for immunization: Secondary | ICD-10-CM

## 2012-11-18 DIAGNOSIS — E1121 Type 2 diabetes mellitus with diabetic nephropathy: Secondary | ICD-10-CM

## 2012-11-18 DIAGNOSIS — N189 Chronic kidney disease, unspecified: Secondary | ICD-10-CM | POA: Diagnosis not present

## 2012-11-18 DIAGNOSIS — L8990 Pressure ulcer of unspecified site, unspecified stage: Secondary | ICD-10-CM

## 2012-11-18 DIAGNOSIS — F4310 Post-traumatic stress disorder, unspecified: Secondary | ICD-10-CM

## 2012-11-18 LAB — POCT UA - MICROSCOPIC ONLY
Casts, Ur, LPF, POC: NEGATIVE
Mucus, UA: NEGATIVE

## 2012-11-18 LAB — POCT URINALYSIS DIPSTICK
Bilirubin, UA: NEGATIVE
Glucose, UA: NEGATIVE
Nitrite, UA: NEGATIVE
Spec Grav, UA: 1.01
Urobilinogen, UA: NEGATIVE

## 2012-11-18 LAB — POCT CBC
Lymph, poc: 2.8 (ref 0.6–3.4)
MCH, POC: 28.8 pg (ref 27–31.2)
MPV: 7.9 fL (ref 0–99.8)
POC LYMPH PERCENT: 20.4 %L (ref 10–50)
Platelet Count, POC: 242 10*3/uL (ref 142–424)
RBC: 5.1 M/uL (ref 4.69–6.13)
RDW, POC: 13.5 %

## 2012-11-18 MED ORDER — HYDROCORTISONE 2.5 % RE CREA: TOPICAL_CREAM | Freq: Two times a day (BID) | RECTAL | Status: DC

## 2012-11-18 MED ORDER — VALSARTAN 80 MG PO TABS: ORAL_TABLET | ORAL | Status: DC

## 2012-11-18 NOTE — Patient Instructions (Addendum)
Continue current medications. Continue good therapeutic lifestyle changes.  Fall precautions discussed with patient. Schedule your flu vaccine the first of October.--- Will give this today Follow up as planned and earlier as needed.  Use hemorrhoid cream twice daily Try to keep gauze between buttocks to allow more air to the decubiti Do not forget to make appointment with Dr. Aundra Dubin

## 2012-11-18 NOTE — Progress Notes (Signed)
Subjective:    Patient ID: [name], male    DOB: 10-18-1930, 79 y.o.   MRN: [id number]  HPI Pt here for follow up and management of chronic medical problems. The urine will be rechecked today following a UTI from the previous visit. Patient has been on to the wound clinic because of pressure sores on both medial buttocks. He was told that he had some pretty large hemorrhoids.   Patient Active Problem List   Diagnosis Date Noted  . Hypertension 08/19/2012  . Diabetes mellitus, type 2 08/19/2012  . CAD, NATIVE VESSEL 03/08/2009  . Type 2 diabetes mellitus with diabetic nephropathy 10/29/2008  . MALNUTRITION, PROTEIN-CALORIE 10/29/2008  . HYPERLIPIDEMIA 10/29/2008  . HYPOKALEMIA 10/29/2008  . ANEMIA 10/29/2008  . PTSD 10/29/2008  . HYPERTENSION, UNSPECIFIED 10/29/2008  . RENAL INSUFFICIENCY, CHRONIC 10/29/2008   Outpatient Encounter Prescriptions as of 11/18/2012  Medication Sig Dispense Refill  . Ascorbic Acid (VITAMIN C) 1000 MG tablet Take 1,000 mg by mouth daily.        Marland Kitchen aspirin 81 MG tablet Take 81 mg by mouth daily.        Marland Kitchen buPROPion (WELLBUTRIN XL) 300 MG 24 hr tablet Take 300 mg by mouth every morning.        . Cholecalciferol (VITAMIN D-3) 5000 UNITS TABS Take 1 tablet by mouth daily.        . Coenzyme Q10 (CO Q-10) 100 MG CAPS Take 1 capsule by mouth 2 (two) times daily.       Marland Kitchen DIOVAN 80 MG tablet TAKE 1 TABLET DAILY  90 tablet  2  . glipiZIDE (GLUCOTROL XL) 10 MG 24 hr tablet TAKE 1 TABLET DAILY  90 tablet  0  . Lutein 40 MG CAPS Take 1 capsule by mouth daily.        . metoprolol tartrate (LOPRESSOR) 25 MG tablet TAKE ONE TABLET TWICE DAILY  180 tablet  1  . nitroGLYCERIN (NITROSTAT) 0.4 MG SL tablet Place 1 tablet (0.4 mg total) under the tongue every 5 (five) minutes as needed.  25 tablet  6  . NON FORMULARY cran-actin 100mg  1 tablet daily      . nystatin (MYCOSTATIN/NYSTOP) 100000 UNIT/GM POWD Apply 100,000 g topically 3 (three) times daily.  15 g  3  .  nystatin-triamcinolone ointment (MYCOLOG) Apply topically 2 (two) times daily.  60 g  3  . Omega-3 Fatty Acids (FISH OIL) 1000 MG CAPS Take 1 capsule by mouth 2 (two) times daily.        Marland Kitchen VYTORIN 10-20 MG per tablet TAKE 1 TABLET EVERY DAY AS DIRECTED  90 tablet  1  . [DISCONTINUED] doxycycline (VIBRAMYCIN) 100 MG capsule Take 100 mg by mouth daily.      . [DISCONTINUED] levofloxacin (LEVAQUIN) 500 MG tablet Take 1 tablet (500 mg total) by mouth daily.  14 tablet  0   No facility-administered encounter medications on file as of 11/18/2012.        Review of Systems  Constitutional: Negative.   HENT: Negative.   Eyes: Negative.   Respiratory: Negative.   Cardiovascular: Negative.   Gastrointestinal: Negative.   Endocrine: Negative.   Genitourinary: Negative.        Recheck urine today from last visit- UTI  Musculoskeletal: Negative.   Skin: Negative.   Allergic/Immunologic: Negative.   Neurological: Negative.   Hematological: Negative.   Psychiatric/Behavioral: Negative.        Objective:   Physical Exam  Nursing note and vitals  reviewed. Constitutional: He is oriented to person, place, and time. He appears well-developed and well-nourished. No distress.  Wheelchair bound due to his spine bifida  HENT:  Head: Normocephalic and atraumatic.  Right Ear: External ear normal.  Left Ear: External ear normal.  Nose: Nose normal.  Mouth/Throat: Oropharynx is clear and moist. No oropharyngeal exudate.  Eyes: Conjunctivae and EOM are normal. Pupils are equal, round, and reactive to light. Right eye exhibits no discharge. Left eye exhibits no discharge. No scleral icterus.  Neck: Normal range of motion. Neck supple. No tracheal deviation present. No thyromegaly present.  Cardiovascular: Normal rate, regular rhythm, normal heart sounds and intact distal pulses.  Exam reveals no gallop and no friction rub.   No murmur heard. At 72 per minute  Pulmonary/Chest: Effort normal and breath  sounds normal. He has no wheezes. He has no rales. He exhibits no tenderness.  Abdominal: Soft. Bowel sounds are normal. He exhibits no mass. There is no tenderness. There is no rebound and no guarding.  Obese  Genitourinary: Rectum normal and penis normal.  Prostate is slightly enlarged. No nodules. Rectal exam was negative for masses. There is no hernia palpated. He does have a large external hemorrhoid on the right side.  Musculoskeletal: He exhibits no edema and no tenderness.  Limited range of motion secondary to spina bifida  Lymphadenopathy:    He has no cervical adenopathy.  Neurological: He is alert and oriented to person, place, and time.  Good range of motion secondary to spina bifida  Skin: Skin is warm. No rash noted. He is diaphoretic. There is erythema. No pallor.  Areas of breakdown both medial buttocks with surrounding erythema  Psychiatric: He has a normal mood and affect. His behavior is normal. Judgment and thought content normal.   BP 136/69  Pulse 67  Temp(Src) 97.4 F (36.3 C) (Oral)  Ht 5\' 8"  (1.727 m)  Wt 237 lb (107.502 kg)  BMI 36.04 kg/m2        Assessment & Plan:   1. Type 2 diabetes mellitus with diabetic nephropathy   2. HYPERLIPIDEMIA   3. ANEMIA   4. HYPERTENSION, UNSPECIFIED   5. Impotence   6. Vitamin D deficiency   7. Prostate cancer screening   8. UTI (urinary tract infection)   9. RENAL INSUFFICIENCY, CHRONIC   10. PTSD   13. Spina bifida   12. Decubitus skin ulcer, stage II, both buttocks secondary to pressure   13. Hemorrhoid    Orders Placed This Encounter  Procedures  . Hepatic function panel  . BMP8+EGFR  . PSA, total and free  . NMR, lipoprofile  . Vit D  25 hydroxy (rtn osteoporosis monitoring)  . POCT CBC  . POCT glycosylated hemoglobin (Hb A1C)  . POCT urinalysis dipstick  . POCT UA - Microscopic Only   Meds ordered this encounter  Medications  . valsartan (DIOVAN) 80 MG tablet    Sig: TAKE 1 TABLET DAILY     Dispense:  90 tablet    Refill:  2  . hydrocortisone (PROCTOCREAM-HC) 2.5 % rectal cream    Sig: Place rectally 2 (two) times daily.    Dispense:  30 g    Refill:  2   Patient Instructions  Continue current medications. Continue good therapeutic lifestyle changes.  Fall precautions discussed with patient. Schedule your flu vaccine the first of October.--- Will give this today Follow up as planned and earlier as needed.  Use hemorrhoid cream twice daily Try to keep  gauze between buttocks to allow more air to the decubiti Do not forget to make appointment with Dr. Donny Pique MD

## 2012-11-20 LAB — NMR, LIPOPROFILE
HDL Particle Number: 36 umol/L (ref >=30.5)
LDL Size: 20 nm — ABNORMAL LOW (ref >20.5)
LP-IR Score: 46 — ABNORMAL HIGH (ref <=45)
Small LDL Particle Number: 967 nmol/L — ABNORMAL HIGH (ref <=527)

## 2012-11-20 LAB — BMP8+EGFR
BUN/Creatinine Ratio: 15 (ref 10–22)
BUN: 22 mg/dL (ref 8–27)
CO2: 23 mmol/L (ref 18–29)
Calcium: 9.5 mg/dL (ref 8.6–10.2)
Creatinine, Ser: 1.43 mg/dL — ABNORMAL HIGH (ref 0.76–1.27)
Glucose: 141 mg/dL — ABNORMAL HIGH (ref 65–99)

## 2012-11-20 LAB — PSA, TOTAL AND FREE
PSA, Free Pct: 30 %
PSA, Free: 0.09 ng/mL
PSA: 0.3 ng/mL (ref 0.0–4.0)

## 2012-11-20 LAB — VITAMIN D 25 HYDROXY (VIT D DEFICIENCY, FRACTURES): Vit D, 25-Hydroxy: 38.1 ng/mL (ref 30.0–100.0)

## 2012-11-20 LAB — HEPATIC FUNCTION PANEL
ALT: 13 IU/L (ref 0–44)
AST: 16 IU/L (ref 0–40)
Albumin: 4.4 g/dL (ref 3.5–4.8)

## 2012-11-22 DIAGNOSIS — E1149 Type 2 diabetes mellitus with other diabetic neurological complication: Secondary | ICD-10-CM | POA: Diagnosis not present

## 2012-11-22 DIAGNOSIS — D649 Anemia, unspecified: Secondary | ICD-10-CM | POA: Diagnosis not present

## 2012-11-22 DIAGNOSIS — I2510 Atherosclerotic heart disease of native coronary artery without angina pectoris: Secondary | ICD-10-CM | POA: Diagnosis not present

## 2012-11-22 DIAGNOSIS — I10 Essential (primary) hypertension: Secondary | ICD-10-CM | POA: Diagnosis not present

## 2012-11-22 DIAGNOSIS — L0231 Cutaneous abscess of buttock: Secondary | ICD-10-CM | POA: Diagnosis not present

## 2012-11-22 DIAGNOSIS — E1142 Type 2 diabetes mellitus with diabetic polyneuropathy: Secondary | ICD-10-CM | POA: Diagnosis not present

## 2012-11-23 DIAGNOSIS — E119 Type 2 diabetes mellitus without complications: Secondary | ICD-10-CM | POA: Diagnosis not present

## 2012-11-23 DIAGNOSIS — K644 Residual hemorrhoidal skin tags: Secondary | ICD-10-CM | POA: Diagnosis not present

## 2012-11-23 DIAGNOSIS — L259 Unspecified contact dermatitis, unspecified cause: Secondary | ICD-10-CM | POA: Diagnosis not present

## 2012-11-23 DIAGNOSIS — I10 Essential (primary) hypertension: Secondary | ICD-10-CM | POA: Diagnosis not present

## 2012-11-23 DIAGNOSIS — L89309 Pressure ulcer of unspecified buttock, unspecified stage: Secondary | ICD-10-CM | POA: Diagnosis not present

## 2012-11-23 DIAGNOSIS — L8992 Pressure ulcer of unspecified site, stage 2: Secondary | ICD-10-CM | POA: Diagnosis not present

## 2012-11-23 DIAGNOSIS — L98499 Non-pressure chronic ulcer of skin of other sites with unspecified severity: Secondary | ICD-10-CM | POA: Diagnosis not present

## 2012-11-23 DIAGNOSIS — Z79899 Other long term (current) drug therapy: Secondary | ICD-10-CM | POA: Diagnosis not present

## 2012-11-24 DIAGNOSIS — I2510 Atherosclerotic heart disease of native coronary artery without angina pectoris: Secondary | ICD-10-CM | POA: Diagnosis not present

## 2012-11-24 DIAGNOSIS — L0231 Cutaneous abscess of buttock: Secondary | ICD-10-CM | POA: Diagnosis not present

## 2012-11-24 DIAGNOSIS — I10 Essential (primary) hypertension: Secondary | ICD-10-CM | POA: Diagnosis not present

## 2012-11-24 DIAGNOSIS — D649 Anemia, unspecified: Secondary | ICD-10-CM | POA: Diagnosis not present

## 2012-11-24 DIAGNOSIS — E1142 Type 2 diabetes mellitus with diabetic polyneuropathy: Secondary | ICD-10-CM | POA: Diagnosis not present

## 2012-11-24 DIAGNOSIS — E1149 Type 2 diabetes mellitus with other diabetic neurological complication: Secondary | ICD-10-CM | POA: Diagnosis not present

## 2012-11-24 DIAGNOSIS — R32 Unspecified urinary incontinence: Secondary | ICD-10-CM | POA: Diagnosis not present

## 2012-11-25 ENCOUNTER — Other Ambulatory Visit: Payer: Self-pay | Admitting: Family Medicine

## 2012-11-29 ENCOUNTER — Ambulatory Visit (INDEPENDENT_AMBULATORY_CARE_PROVIDER_SITE_OTHER): Payer: Medicare Other | Admitting: Family Medicine

## 2012-11-29 ENCOUNTER — Encounter: Payer: Self-pay | Admitting: Family Medicine

## 2012-11-29 VITALS — BP 121/57 | HR 95 | Temp 97.7°F | Wt 232.4 lb

## 2012-11-29 DIAGNOSIS — I10 Essential (primary) hypertension: Secondary | ICD-10-CM | POA: Diagnosis not present

## 2012-11-29 DIAGNOSIS — D649 Anemia, unspecified: Secondary | ICD-10-CM | POA: Diagnosis not present

## 2012-11-29 DIAGNOSIS — E46 Unspecified protein-calorie malnutrition: Secondary | ICD-10-CM

## 2012-11-29 DIAGNOSIS — K5900 Constipation, unspecified: Secondary | ICD-10-CM | POA: Diagnosis not present

## 2012-11-29 DIAGNOSIS — K649 Unspecified hemorrhoids: Secondary | ICD-10-CM | POA: Diagnosis not present

## 2012-11-29 DIAGNOSIS — K623 Rectal prolapse: Secondary | ICD-10-CM

## 2012-11-29 DIAGNOSIS — L0231 Cutaneous abscess of buttock: Secondary | ICD-10-CM | POA: Diagnosis not present

## 2012-11-29 DIAGNOSIS — E8881 Metabolic syndrome: Secondary | ICD-10-CM | POA: Insufficient documentation

## 2012-11-29 DIAGNOSIS — N058 Unspecified nephritic syndrome with other morphologic changes: Secondary | ICD-10-CM

## 2012-11-29 DIAGNOSIS — E119 Type 2 diabetes mellitus without complications: Secondary | ICD-10-CM

## 2012-11-29 DIAGNOSIS — Q059 Spina bifida, unspecified: Secondary | ICD-10-CM

## 2012-11-29 DIAGNOSIS — E1129 Type 2 diabetes mellitus with other diabetic kidney complication: Secondary | ICD-10-CM

## 2012-11-29 DIAGNOSIS — E1149 Type 2 diabetes mellitus with other diabetic neurological complication: Secondary | ICD-10-CM | POA: Diagnosis not present

## 2012-11-29 DIAGNOSIS — F4310 Post-traumatic stress disorder, unspecified: Secondary | ICD-10-CM

## 2012-11-29 DIAGNOSIS — I2510 Atherosclerotic heart disease of native coronary artery without angina pectoris: Secondary | ICD-10-CM | POA: Diagnosis not present

## 2012-11-29 DIAGNOSIS — K5909 Other constipation: Secondary | ICD-10-CM | POA: Insufficient documentation

## 2012-11-29 DIAGNOSIS — K5904 Chronic idiopathic constipation: Secondary | ICD-10-CM | POA: Insufficient documentation

## 2012-11-29 DIAGNOSIS — E1121 Type 2 diabetes mellitus with diabetic nephropathy: Secondary | ICD-10-CM

## 2012-11-29 DIAGNOSIS — K622 Anal prolapse: Secondary | ICD-10-CM

## 2012-11-29 DIAGNOSIS — K648 Other hemorrhoids: Secondary | ICD-10-CM | POA: Insufficient documentation

## 2012-11-29 DIAGNOSIS — E1142 Type 2 diabetes mellitus with diabetic polyneuropathy: Secondary | ICD-10-CM | POA: Diagnosis not present

## 2012-11-29 MED ORDER — POLYETHYLENE GLYCOL 3350 17 GM/SCOOP PO POWD: 17.0000 g | Freq: Two times a day (BID) | ORAL | Status: DC | PRN

## 2012-11-29 MED ORDER — HYDROCORTISONE ACETATE 25 MG RE SUPP: 25.0000 mg | Freq: Two times a day (BID) | RECTAL | Status: DC

## 2012-11-29 NOTE — Patient Instructions (Addendum)
      Dr Elishia Kaczorowski's Recommendations  For nutrition information, I recommend books:  1).Eat to Live by Dr Joel Fuhrman. 2).Prevent and Reverse Heart Disease by Dr Caldwell Esselstyn. 3) Dr Neal Barnard's Book:  Program to Reverse Diabetes  Exercise recommendations are:  If unable to walk, then the patient can exercise in a chair 3 times a day. By flapping arms like a bird gently and raising legs outwards to the front.  If ambulatory, the patient can go for walks for 30 minutes 3 times a week. Then increase the intensity and duration as tolerated.  Goal is to try to attain exercise frequency to 5 times a week.  If applicable: Best to perform resistance exercises (machines or weights) 2 days a week and cardio type exercises 3 days per week.  

## 2012-11-29 NOTE — Progress Notes (Signed)
Patient ID: [name], male   DOB: 1930/08/11, 79 y.o.   MRN: [id number] SUBJECTIVE: CC: Chief Complaint  Patient presents with  . Acute Visit    hemorrhoid not bleeding saw dr Laurance Flatten for this the other day . states he has a home health nurse that comes and he is going to [facility] and he is taking sitz baths .. dr Laurance Flatten prescribed proctosol      HPI:  Breakfast: 1/2 cinnamon roll Lunch: clam chowder Dinner: fruit, crackers  Has major medical problems including DM, CAD. Obesity, hyperlipidemia and Hypertension.  Came because  recntly trated for hemorrhoid but it has popped out again and it seems to be more than a hemorrhoid.Marland Kitchen Has gluteal decubitus ulcers being treated at the wound center in [place]. Musculoskeletal limitations inhibit him from exercising.   Past Medical History  Diagnosis Date  . CAD (coronary artery disease)     native vessel  . Diabetes mellitus type II   . Hyperlipidemia   . HTN (hypertension)   . Chronic renal insufficiency   . Carotid artery disease     nonobstructive  . Anemia   . Hypokalemia   . Malnutrition     protein-calorie  . PTSD (post-traumatic stress disorder)    Past Surgical History  Procedure Laterality Date  . Spinda bifida surgery    . Left knee surgery    . Coronary artery bypass graft  1998?   History   Social History  . Marital Status: Married    Spouse Name: N/A    Number of Children: N/A  . Years of Education: N/A   Occupational History  . Retired    Social History Main Topics  . Smoking status: Former Smoker -- 2.00 packs/day    Types: Cigarettes    Start date: 11/20/1948    Quit date: 08/19/1980  . Smokeless tobacco: Former Systems developer  . Alcohol Use: No  . Drug Use: No  . Sexual Activity: Not on file   Other Topics Concern  . Not on file   Social History Narrative  . No narrative on file   No family history on file. Current Outpatient Prescriptions on File Prior to Visit  Medication Sig Dispense  Refill  . Ascorbic Acid (VITAMIN C) 1000 MG tablet Take 1,000 mg by mouth daily.        Marland Kitchen aspirin 81 MG tablet Take 81 mg by mouth daily.        Marland Kitchen buPROPion (WELLBUTRIN XL) 300 MG 24 hr tablet Take 300 mg by mouth every morning.        . Cholecalciferol (VITAMIN D-3) 5000 UNITS TABS Take 1 tablet by mouth daily.        . Coenzyme Q10 (CO Q-10) 100 MG CAPS Take 1 capsule by mouth 2 (two) times daily.       Marland Kitchen glipiZIDE (GLUCOTROL XL) 10 MG 24 hr tablet TAKE 1 TABLET DAILY  90 tablet  0  . hydrocortisone (PROCTOCREAM-HC) 2.5 % rectal cream Place rectally 2 (two) times daily.  30 g  2  . Lutein 40 MG CAPS Take 1 capsule by mouth daily.        . metoprolol tartrate (LOPRESSOR) 25 MG tablet TAKE ONE TABLET TWICE DAILY  180 tablet  1  . nitroGLYCERIN (NITROSTAT) 0.4 MG SL tablet Place 1 tablet (0.4 mg total) under the tongue every 5 (five) minutes as needed.  25 tablet  6  . NON FORMULARY cran-actin 100mg  1 tablet daily      .  nystatin (MYCOSTATIN/NYSTOP) 100000 UNIT/GM POWD Apply 100,000 g topically 3 (three) times daily.  15 g  3  . nystatin-triamcinolone ointment (MYCOLOG) Apply topically 2 (two) times daily.  60 g  3  . Omega-3 Fatty Acids (FISH OIL) 1000 MG CAPS Take 1 capsule by mouth 2 (two) times daily.        . valsartan (DIOVAN) 80 MG tablet TAKE 1 TABLET DAILY  90 tablet  2  . VYTORIN 10-20 MG per tablet TAKE 1 TABLET EVERY DAY AS DIRECTED  90 tablet  1   No current facility-administered medications on file prior to visit.   Allergies  Allergen Reactions  . Nitrofurantoin   . Sulfonamide Derivatives     REACTION: pruitis   Immunization History  Administered Date(s) Administered  . Influenza,inj,Quad PF,36+ Mos 11/18/2012   Prior to Admission medications   Medication Sig Start Date End Date Taking? Authorizing Provider  Ascorbic Acid (VITAMIN C) 1000 MG tablet Take 1,000 mg by mouth daily.     Yes Historical Provider, MD  aspirin 81 MG tablet Take 81 mg by mouth daily.     Yes  Historical Provider, MD  buPROPion (WELLBUTRIN XL) 300 MG 24 hr tablet Take 300 mg by mouth every morning.     Yes Historical Provider, MD  Cholecalciferol (VITAMIN D-3) 5000 UNITS TABS Take 1 tablet by mouth daily.     Yes Historical Provider, MD  Coenzyme Q10 (CO Q-10) 100 MG CAPS Take 1 capsule by mouth 2 (two) times daily.    Yes Historical Provider, MD  glipiZIDE (GLUCOTROL XL) 10 MG 24 hr tablet TAKE 1 TABLET DAILY 09/22/12  Yes Vernie Shanks, MD  hydrocortisone (PROCTOCREAM-HC) 2.5 % rectal cream Place rectally 2 (two) times daily. 11/18/12  Yes Chipper Herb, MD  Lutein 40 MG CAPS Take 1 capsule by mouth daily.     Yes Historical Provider, MD  metoprolol tartrate (LOPRESSOR) 25 MG tablet TAKE ONE TABLET TWICE DAILY 07/16/12  Yes Renella Cunas, MD  nitroGLYCERIN (NITROSTAT) 0.4 MG SL tablet Place 1 tablet (0.4 mg total) under the tongue every 5 (five) minutes as needed. 05/13/12  Yes Renella Cunas, MD  NON FORMULARY cran-actin 100mg  1 tablet daily   Yes Historical Provider, MD  nystatin (MYCOSTATIN/NYSTOP) 100000 UNIT/GM POWD Apply 100,000 g topically 3 (three) times daily. 09/24/12  Yes Lysbeth Penner, FNP  nystatin-triamcinolone ointment [facility]) Apply topically 2 (two) times daily. 09/24/12  Yes Lysbeth Penner, FNP  Omega-3 Fatty Acids (FISH OIL) 1000 MG CAPS Take 1 capsule by mouth 2 (two) times daily.     Yes Historical Provider, MD  valsartan (DIOVAN) 80 MG tablet TAKE 1 TABLET DAILY 11/18/12  Yes Chipper Herb, MD  VYTORIN 10-20 MG per tablet TAKE 1 TABLET EVERY DAY AS DIRECTED 05/16/12  Yes Chipper Herb, MD     ROS: As above in the HPI. All other systems are stable or negative.  OBJECTIVE: APPEARANCE:  Patient in no acute distress.The patient appeared well nourished and normally developed. Acyanotic. Waist:43.25 inches VITAL SIGNS:BP 121/57  Pulse 95  Temp(Src) 97.7 F (36.5 C) (Oral)  Wt 232 lb 6.4 oz (105.416 kg)  BMI 35.34 kg/m2  WM morbidly obese  SKIN: warm and   Dry  Grade 1 blistering decubitus ulcers of both buttocks.  HEAD and Neck: without JVD, Head and scalp: normal Eyes:No scleral icterus. Fundi normal, eye movements normal. Ears: Auricle normal, canal normal, Tympanic membranes normal, insufflation normal. Nose: normal  Throat: normal Neck & thyroid: normal  CHEST & LUNGS: Chest wall: normal Lungs: Clear  CVS: Reveals the PMI to be normally located. Regular rhythm, First and Second Heart sounds are normal,  absence of murmurs, rubs or gallops. Peripheral vasculature: Radial pulses: normal Dorsal pedis pulses: normal Posterior pulses: normal  ABDOMEN:  Appearance: morbidly obese Benign, no organomegaly, no masses, no Abdominal Aortic enlargement. No Guarding , no rebound. No Bruits. Bowel sounds: normal  RECTAL: prolapsed hemorrhoid and prolapsed anal mucosa at the 3 to 6 oclock position. Large amount of  Firm stools in the rectum.  GU: N/A   NEUROLOGIC: oriented to time,place and person; nonfocal. Gait disturbance with valgus deformity. Ambulates with a rolling walker due to his neuromuscular disabilities and obesity and  Deconditioned status  ASSESSMENT: Anal prolapse - Plan: Ambulatory referral to General Surgery  Chronic constipation - Plan: polyethylene glycol powder (GLYCOLAX/MIRALAX) powder  Hemorrhoid prolapse - Plan: hydrocortisone (ANUSOL-HC) 25 MG suppository, Ambulatory referral to General Surgery  CAD, NATIVE VESSEL  Diabetes mellitus, type 2  Spina bifida  Type 2 diabetes mellitus with diabetic nephropathy  PTSD  MALNUTRITION, PROTEIN-CALORIE  Metabolic syndrome  Morbidly obese with poor dietary habits, chronic  Constipation, with recurrent hemorrhoids and now prolapsed hemorrhoids and partially prolapsed anal mucosa.  Decubitus ulcers of the buttocks even with a donut seat.  PLAN: Discussed need for aggressive lifestyle changes      Dr Paula Libra Recommendations  For nutrition  information, I recommend books:  1).Eat to Live by Dr Excell Seltzer. 2).Prevent and Reverse Heart Disease by Dr Karl Luke. 3) Dr Janene Harvey Book:  Program to Reverse Diabetes  Exercise recommendations are:  If unable to walk, then the patient can exercise in a chair 3 times a day. By flapping arms like a bird gently and raising legs outwards to the front.  If ambulatory, the patient can go for walks for 30 minutes 3 times a week. Then increase the intensity and duration as tolerated.  Goal is to try to attain exercise frequency to 5 times a week.  If applicable: Best to perform resistance exercises (machines or weights) 2 days a week and cardio type exercises 3 days per week.  Orders Placed This Encounter  Procedures  . Ambulatory referral to General Surgery    Referral Priority:  Routine    Referral Type:  Surgical    Referral Reason:  Specialty Services Required    Requested Specialty:  General Surgery    Number of Visits Requested:  1    Meds ordered this encounter  Medications  . polyethylene glycol powder (GLYCOLAX/MIRALAX) powder    Sig: Take 17 g by mouth 2 (two) times daily as needed.    Dispense:  3350 g    Refill:  1  . hydrocortisone (ANUSOL-HC) 25 MG suppository    Sig: Place 1 suppository (25 mg total) rectally 2 (two) times daily.    Dispense:  12 suppository    Refill:  0   Sitz baths. Follow up with the wound center.  Return in about 2 months (around 01/29/2013) for Recheck medical problems.  Areal Cochrane P. Jacelyn Grip, M.D.

## 2012-12-05 ENCOUNTER — Other Ambulatory Visit: Payer: Self-pay | Admitting: Family Medicine

## 2012-12-06 DIAGNOSIS — E1149 Type 2 diabetes mellitus with other diabetic neurological complication: Secondary | ICD-10-CM | POA: Diagnosis not present

## 2012-12-06 DIAGNOSIS — I2510 Atherosclerotic heart disease of native coronary artery without angina pectoris: Secondary | ICD-10-CM | POA: Diagnosis not present

## 2012-12-06 DIAGNOSIS — E1142 Type 2 diabetes mellitus with diabetic polyneuropathy: Secondary | ICD-10-CM | POA: Diagnosis not present

## 2012-12-06 DIAGNOSIS — L0231 Cutaneous abscess of buttock: Secondary | ICD-10-CM | POA: Diagnosis not present

## 2012-12-06 DIAGNOSIS — I10 Essential (primary) hypertension: Secondary | ICD-10-CM | POA: Diagnosis not present

## 2012-12-06 DIAGNOSIS — D649 Anemia, unspecified: Secondary | ICD-10-CM | POA: Diagnosis not present

## 2012-12-07 ENCOUNTER — Ambulatory Visit (INDEPENDENT_AMBULATORY_CARE_PROVIDER_SITE_OTHER): Payer: Medicare Other | Admitting: Surgery

## 2012-12-07 DIAGNOSIS — L259 Unspecified contact dermatitis, unspecified cause: Secondary | ICD-10-CM | POA: Diagnosis not present

## 2012-12-07 DIAGNOSIS — L8992 Pressure ulcer of unspecified site, stage 2: Secondary | ICD-10-CM | POA: Diagnosis not present

## 2012-12-07 DIAGNOSIS — L89309 Pressure ulcer of unspecified buttock, unspecified stage: Secondary | ICD-10-CM | POA: Diagnosis not present

## 2012-12-07 DIAGNOSIS — L98499 Non-pressure chronic ulcer of skin of other sites with unspecified severity: Secondary | ICD-10-CM | POA: Diagnosis not present

## 2012-12-08 ENCOUNTER — Telehealth: Payer: Self-pay | Admitting: Family Medicine

## 2012-12-08 ENCOUNTER — Other Ambulatory Visit: Payer: Self-pay | Admitting: Cardiology

## 2012-12-10 MED ORDER — VALSARTAN 80 MG PO TABS: ORAL_TABLET | ORAL | Status: DC

## 2012-12-10 NOTE — Telephone Encounter (Signed)
dwm to sign mon- and we will call pt

## 2012-12-13 ENCOUNTER — Encounter (INDEPENDENT_AMBULATORY_CARE_PROVIDER_SITE_OTHER): Payer: Self-pay | Admitting: Surgery

## 2012-12-13 ENCOUNTER — Ambulatory Visit (INDEPENDENT_AMBULATORY_CARE_PROVIDER_SITE_OTHER): Payer: Medicare Other | Admitting: Surgery

## 2012-12-13 VITALS — BP 130/82 | HR 68 | Temp 97.6°F | Resp 14 | Ht 68.0 in | Wt 244.0 lb

## 2012-12-13 DIAGNOSIS — R32 Unspecified urinary incontinence: Secondary | ICD-10-CM | POA: Diagnosis not present

## 2012-12-13 DIAGNOSIS — K648 Other hemorrhoids: Secondary | ICD-10-CM

## 2012-12-13 DIAGNOSIS — K5900 Constipation, unspecified: Secondary | ICD-10-CM | POA: Diagnosis not present

## 2012-12-13 DIAGNOSIS — K644 Residual hemorrhoidal skin tags: Secondary | ICD-10-CM

## 2012-12-13 DIAGNOSIS — K5909 Other constipation: Secondary | ICD-10-CM

## 2012-12-13 NOTE — Progress Notes (Signed)
Subjective:     Patient ID: [name], male   DOB: 08-May-1930, 79 y.o.   MRN: [id number]  HPI  [name]  [birth year] [id number]  Patient Care Team: Chipper Herb, MD as PCP - General (Family Medicine)  This patient is a 79 y.o.male who presents today for surgical evaluation at the request of Dr. Jacelyn Grip.   Reason for visit: Hemorrhoids.  Pleasant elderly male.  History of spina bifida.  Dwindling urinary continence.  Struggles with constipation.  Some occasional incontinence.  Had a more recent hemorrhoid flare about three weeks ago.  Some bleeding.  Struggles with constipation.  Started on a MiraLAX bowel regimen.  Constipation better but still has some hemorrhoids possibly prolapsing out.  No major bleeding.  He comes today with his wife.  Also developed skin ulcers around the anus between the buttock cheeks.  Going to wound care center for that.  Using Gold powders and creams.  Patient Active Problem List   Diagnosis Date Noted  . Chronic constipation 11/29/2012  . Anal prolapse 11/29/2012  . Metabolic syndrome [id number]  . Diabetes mellitus, type 2 11/29/2012  . Hemorrhoid prolapse 11/29/2012  . Spina bifida 11/18/2012  . CAD, NATIVE VESSEL 03/08/2009  . Type 2 diabetes mellitus with diabetic nephropathy 10/29/2008  . MALNUTRITION, PROTEIN-CALORIE 10/29/2008  . HYPERLIPIDEMIA 10/29/2008  . HYPOKALEMIA 10/29/2008  . ANEMIA 10/29/2008  . PTSD 10/29/2008  . HYPERTENSION, UNSPECIFIED 10/29/2008  . RENAL INSUFFICIENCY, CHRONIC 10/29/2008    Past Medical History  Diagnosis Date  . CAD (coronary artery disease)     native vessel  . Diabetes mellitus type II   . Hyperlipidemia   . HTN (hypertension)   . Chronic renal insufficiency   . Carotid artery disease     nonobstructive  . Anemia   . Hypokalemia   . Malnutrition     protein-calorie  . PTSD (post-traumatic stress disorder)     Past Surgical History  Procedure Laterality Date  . Spinda bifida  surgery    . Left knee surgery    . Coronary artery bypass graft  1998?  Marland Kitchen Hemorrhoid surgery  1980s?    History   Social History  . Marital Status: Married    Spouse Name: N/A    Number of Children: N/A  . Years of Education: N/A   Occupational History  . Retired    Social History Main Topics  . Smoking status: Former Smoker -- 2.00 packs/day    Types: Cigarettes    Start date: 11/20/1948    Quit date: 08/19/1980  . Smokeless tobacco: Former Systems developer  . Alcohol Use: No  . Drug Use: No  . Sexual Activity: Not on file   Other Topics Concern  . Not on file   Social History Narrative  . No narrative on file    History reviewed. No pertinent family history.  Current Outpatient Prescriptions  Medication Sig Dispense Refill  . Ascorbic Acid (VITAMIN C) 1000 MG tablet Take 1,000 mg by mouth daily.        Marland Kitchen aspirin 81 MG tablet Take 81 mg by mouth daily.        Marland Kitchen buPROPion (WELLBUTRIN XL) 300 MG 24 hr tablet Take 300 mg by mouth every morning.        . Cholecalciferol (VITAMIN D-3) 5000 UNITS TABS Take 1 tablet by mouth daily.        . Coenzyme Q10 (CO Q-10) 100 MG CAPS Take 1 capsule by mouth  2 (two) times daily.       Marland Kitchen glipiZIDE (GLUCOTROL XL) 10 MG 24 hr tablet TAKE 1 TABLET DAILY  90 tablet  0  . hydrocortisone (ANUSOL-HC) 25 MG suppository Place 1 suppository (25 mg total) rectally 2 (two) times daily.  12 suppository  0  . hydrocortisone (PROCTOCREAM-HC) 2.5 % rectal cream Place rectally 2 (two) times daily.  30 g  2  . Lutein 40 MG CAPS Take 1 capsule by mouth daily.        . metoprolol tartrate (LOPRESSOR) 25 MG tablet TAKE 1 TABLET TWICE A DAY  180 tablet  1  . nitroGLYCERIN (NITROSTAT) 0.4 MG SL tablet Place 1 tablet (0.4 mg total) under the tongue every 5 (five) minutes as needed.  25 tablet  6  . NON FORMULARY cran-actin 100mg  1 tablet daily      . nystatin (MYCOSTATIN/NYSTOP) 100000 UNIT/GM POWD Apply 100,000 g topically 3 (three) times daily.  15 g  3  .  nystatin-triamcinolone ointment (MYCOLOG) Apply topically 2 (two) times daily.  60 g  3  . Omega-3 Fatty Acids (FISH OIL) 1000 MG CAPS Take 1 capsule by mouth 2 (two) times daily.        . polyethylene glycol powder (GLYCOLAX/MIRALAX) powder Take 17 g by mouth 2 (two) times daily as needed.  3350 g  1  . valsartan (DIOVAN) 80 MG tablet TAKE 1 TABLET DAILY  90 tablet  2  . VYTORIN 10-20 MG per tablet TAKE 1 TABLET DAILY AS DIRECTED  90 tablet  1   No current facility-administered medications for this visit.     Allergies  Allergen Reactions  . Nitrofurantoin   . Sulfonamide Derivatives     REACTION: pruitis    BP 130/82  Pulse 68  Temp(Src) 97.6 F (36.4 C) (Temporal)  Resp 14  Ht 5\' 8"  (1.727 m)  Wt 244 lb (110.678 kg)  BMI 37.11 kg/m2  No results found.   Review of Systems  Constitutional: Negative for fever, chills and diaphoresis.  HENT: Positive for hearing loss. Negative for ear discharge, facial swelling, mouth sores, nosebleeds, sore throat and trouble swallowing.   Eyes: Negative for photophobia, discharge and visual disturbance.  Respiratory: Positive for chest tightness. Negative for choking, shortness of breath and stridor.   Cardiovascular: Negative for chest pain and palpitations.  Gastrointestinal: Positive for constipation, anal bleeding and rectal pain. Negative for nausea, vomiting, abdominal pain, diarrhea, blood in stool and abdominal distention.  Endocrine: Negative for cold intolerance and heat intolerance.  Genitourinary: Negative for dysuria, urgency, difficulty urinating and testicular pain.       Urinary incontinence.  In chronic diapers.  Musculoskeletal: Positive for arthralgias, gait problem and myalgias. Negative for back pain.       Uses walker  Skin: Negative for color change, pallor, rash and wound.  Allergic/Immunologic: Negative for environmental allergies and food allergies.  Neurological: Positive for weakness. Negative for dizziness,  speech difficulty, numbness and headaches.  Hematological: Negative for adenopathy. Does not bruise/bleed easily.  Psychiatric/Behavioral: Negative for hallucinations, confusion and agitation.       Objective:   Physical Exam  Constitutional: He is oriented to person, place, and time. He appears well-developed and well-nourished. No distress.  HENT:  Head: Normocephalic.  Mouth/Throat: Oropharynx is clear and moist. No oropharyngeal exudate.  Eyes: Conjunctivae and EOM are normal. Pupils are equal, round, and reactive to light. No scleral icterus.  Neck: Normal range of motion. Neck supple. No tracheal deviation  present.  Cardiovascular: Normal rate, regular rhythm and intact distal pulses.   Pulmonary/Chest: Effort normal and breath sounds normal. No respiratory distress.  Abdominal: Soft. He exhibits no distension. There is no tenderness. Hernia confirmed negative in the right inguinal area and confirmed negative in the left inguinal area.  Genitourinary: Rectal exam shows external hemorrhoid, internal hemorrhoid and anal tone abnormal. Rectal exam shows no fissure. Prostate is enlarged. Prostate is not tender.     Exam done with assistance of male Medical Assistant in the room.  Perianal skin clean with good hygiene x superificial ulcers.  No pruritis.  No pilonidal disease.  No fissure.  No abscess/fistula.    R post>ant external hemorrhoids.  Tolerates digital and anoscopic rectal exam.  Decreased sphincter tone.  No defects.  No rectal masses.  Hemorrhoidal piles internal enlarged R post>R ant >>L lat  Musculoskeletal: Normal range of motion. He exhibits no tenderness.  Lymphadenopathy:    He has no cervical adenopathy.       Right: No inguinal adenopathy present.       Left: No inguinal adenopathy present.  Neurological: He is alert and oriented to person, place, and time. No cranial nerve deficit. He exhibits normal muscle tone. Coordination normal.  Skin: Skin is warm and  dry. No rash noted. He is not diaphoretic. No erythema. No pallor.  Psychiatric: He has a normal mood and affect. His behavior is normal. Judgment and thought content normal.       Assessment:     External and internal hemorrhoids in the setting of chronic constipation and urinary incontinence/chronic moisture.     Plan:     At this point, I think the simplest intervention will be to do hemorrhoidal banding of the internal piles to keep them from bleeding more prolapsing.  Hopefully will control the external hemorrhoids as well.  I discussed this with the patient he agreed.  I banded the right anterior and posterior piles.  The left lateral pile was small, so I left it alone:  The anatomy & physiology of the anorectal region was discussed.  The pathophysiology of hemorrhoids and differential diagnosis was discussed.  Natural history progression  was discussed.   I stressed the importance of a bowel regimen to have daily soft bowel movements to minimize progression of disease.     The patient's symptoms are not adequately controlled.  Therefore, I recommended banding to treat the hemorrhoids.  I went over the technique, risks, benefits, and alternatives.   Goals of post-operative recovery were discussed as well.  Questions were answered.  The patient expressed understanding & wished to proceed.  The patient was positioned in the lateral decubitus position.  Perianal & rectal examination was done.  Using anoscopy, I ligated the hemorrhoids above the dentate line with banding.  The patient tolerated the procedure well.  Educational handouts further explaining the pathology, treatment options, and bowel regimen were given as well.    With his poor sphincter tone, I think he would benefit from evaluation for possible Pelvic floor therapy and biofeedback.  Recommended Kegel pelvic floor exercises as well.  I suspect the perineal wounds are due to chronic motion or from his urinary incontinence and  irregular bowels.  Agree with powders to keep the area clean and follow with the wound care.  Hold off on trying to do any fecal diversion at this time.

## 2012-12-13 NOTE — Patient Instructions (Signed)
HEMORRHOIDS  The rectum is the last foot of your colon, and it naturally stretches to hold stool.  Hemorrhoidal piles are natural clusters of blood vessels that help the rectum and anal canal stretch to hold stool and allow bowel movements to eliminate feces.   Hemorrhoids are abnormally swollen blood vessels in the rectum.  Too much pressure in the rectum causes hemorrhoids by forcing blood to stretch and bulge the walls of the veins, sometimes even rupturing them.  Hemorrhoids can become like varicose veins you might see on a person's legs.  Most people will develop a flare of hemorrhoids in their lifetime.  When bulging hemorrhoidal veins are irritated, they can swell, burn, itch, cause pain, and bleed.  Most flares will calm down gradually own within a few weeks.  However, once hemorrhoids are created, they are difficult to get rid of completely and tend to flare more easily than the first flare.   Fortunately, good habits and simple medical treatment usually control hemorrhoids well, and surgery is needed only in severe cases. Types of Hemorrhoids:  Internal hemorrhoids usually don't initially hurt or itch; they are deep inside the rectum and usually have no sensation. If they begin to push out (prolapse), pain and burning can occur.  However, internal hemorrhoids can bleed.  Anal bleeding should not be ignored since bleeding could come from a dangerous source like colorectal cancer, so persistent rectal bleeding should be investigated by a doctor, sometimes with a colonoscopy.  External hemorrhoids cause most of the symptoms - pain, burning, and itching. Nonirritated hemorrhoids can look like small skin tags coming out of the anus.   Thrombosed hemorrhoids can form when a hemorrhoid blood vessel bursts and causes the hemorrhoid to suddenly swell.  A purple blood clot can form in it and become an excruciatingly painful lump at the anus. Because of these unpleasant symptoms, immediate incision and  drainage by a surgeon at an office visit can provide much relief of the pain.    PREVENTION Avoiding the most frequent causes listed below will prevent most cases of hemorrhoids: Constipation Hard stools Diarrhea  Constant sitting  Straining with bowel movements Sitting on the toilet for a long time  Severe coughing  episodes Pregnancy / Childbirth  Heavy Lifting  Sometimes avoiding the above triggers is difficult:  How can you avoid sitting all day if you have a seated job? Also, we try to avoid coughing and diarrhea, but sometimes it's beyond your control.  Still, there are some practical hints to help: Keep the anal and genital area clean.  Moistened tissues such as flushable wet wipes are less irritating than toilet paper.  Using irrigating showers or bottle irrigation washing gently cleans this sensitive area.   Avoid dry toilet paper when cleaning after bowel movements.  Marland Kitchen Keep the anal and genital area dry.  Lightly pat the rectal area dry.  Avoid rubbing.  Talcum or baby powders can help GET YOUR STOOLS SOFT.   This is the most important way to prevent irritated hemorrhoids.  Hard stools are like sandpaper to the anorectal canal and will cause more problems.  The goal: ONE SOFT BOWEL MOVEMENT A DAY!  BMs from every other day to 3 times a day is a tolerable range Treat coughing, diarrhea and constipation early since irritated hemorrhoids may soon follow.  If your main job activity is seated, always stand or walk during your breaks. Make it a point to stand and walk at least 5 minutes every hour  and try to shift frequently in your chair to avoid direct rectal pressure.  Always exhale as you strain or lift. Don't hold your breath.  Do not delay or try to prevent a bowel movement when the urge is present. Exercise regularly (walking or jogging 60 minutes a day) to stimulate the bowels to move. No reading or other activity while on the toilet. If bowel movements take longer than 5 minutes,  you are too constipated. AVOID CONSTIPATION Drink plenty of liquids (1 1/2 to 2 quarts of water and other fluids a day unless fluid restricted for another medical condition). Liquids that contain caffeine (coffee a, tea, soft drinks) can be dehydrating and should be avoided until constipation is controlled. Consider minimizing milk, as dairy products may be constipating. Eat plenty of fiber (30g a day ideal, more if needed).  Fiber is the undigested part of plant food that passes into the colon, acting as "natures broom" to encourage bowel motility and movement.  Fiber can absorb and hold large amounts of water. This results in a larger, bulkier stool, which is soft and easier to pass.  Eating foods high in fiber - 12 servings - such as  Vegetables: Root (potatoes, carrots, turnips), Leafy green (lettuce, salad greens, celery, spinach), High residue (cabbage, broccoli, etc.) Fruit: Fresh, Dried (prunes, apricots, cherries), Stewed (applesauce)  Whole grain breads, pasta, whole wheat Bran cereals, muffins, etc. Consider adding supplemental bulking fiber which retains large volumes of water: Psyllium ground seeds (native plant from [place])--available as Metamucil, Konsyl, Effersyllium, Per Diem Fiber, or the less expensive generic forms.  Citrucel  (methylcellulose wood fiber) . FiberCon (Polycarbophil) Polyethylene Glycol - and "artificial" fiber commonly called Miralax or Glycolax.  It is helpful for people with gassy or bloated feelings with regular fiber Flax Seed - a less gassy natural fiber  Laxatives can be useful for a short period if constipation is severe Osmotics (Milk of Magnesia, Fleets Phospho-Soda, Magnesium Citrate)  Stimulants (Senokot,   Castor Oil,  Dulcolax, Ex-Lax)    Laxatives are not a good long-term solution as it can stress the bowels and cause too much mineral loss and dehydration.   Avoid taking laxatives for more than 7 days in a row.  AVOID DIARRHEA Switch to  liquids and simpler foods for a few days to avoid stressing your intestines further. Avoid dairy products (especially milk & ice cream) for a short time.  The intestines often can lose the ability to digest lactose when stressed. Avoid foods that cause gassiness or bloating.  Typical foods include beans and other legumes, cabbage, broccoli, and dairy foods.  Every person has some sensitivity to other foods, so listen to your body and avoid those foods that trigger problems for you. Adding fiber (Citrucel, Metamucil, FiberCon, Flax seed, Miralax) gradually can help thicken stools by absorbing excess fluid and retrain the intestines to act more normally.  Slowly increase the dose over a few weeks.  Too much fiber too soon can backfire and cause cramping & bloating. Probiotics (such as active yogurt, Align, etc) may help repopulate the intestines and colon with normal bacteria and calm down a sensitive digestive tract.  Most studies show it to be of mild help, though, and such products can be costly. Medicines: Bismuth subsalicylate (ex. Kayopectate, Pepto Bismol) every 30 minutes for up to 6 doses can help control diarrhea.  Avoid if pregnant. Loperamide (Immodium) can slow down diarrhea.  Start with two tablets (57m total) first and then try one tablet  every 6 hours.  Avoid if you are having fevers or severe pain.  If you are not better or start feeling worse, stop all medicines and call your doctor for advice Call your doctor if you are getting worse or not better.  Sometimes further testing (cultures, endoscopy, X-ray studies, bloodwork, etc) may be needed to help diagnose and treat the cause of the diarrhea. TREATMENT OF HEMORRHOID FLARE If these preventive measures fail, you must take action right away! Hemorrhoids are one condition that can be mild in the morning and become intolerable by nightfall. Most hemorrhoidal flares take several weeks to calm down.  These suggestions can help: Warm soaks.   This helps more than any topical medication.  Use up to 8 times a day.  Usually sitz baths or sitting in a warm bathtub helps.  Sitting on moist warm towels are helpful.  Switching to ice packs/cool compresses can be helpful  Use a Sitz Bath 4-8 times a day for relief A sitz bath is a warm water bath taken in the sitting position that covers only the hips and buttocks. It may be used for either healing or hygiene purposes. Sitz baths are also used to relieve pain, itching, or muscle spasms. The water may contain medicine. Moist heat will help you heal and relax.  HOME CARE INSTRUCTIONS  Take 3 to 4 sitz baths a day. 1. Fill the bathtub half full with warm water. 2. Sit in the water and open the drain a little. 3. Turn on the warm water to keep the tub half full. Keep the water running constantly. 4. Soak in the water for 15 to 20 minutes. 5. After the sitz bath, pat the affected area dry first. SEEK MEDICAL CARE IF:  You get worse instead of better. Stop the sitz baths if you get worse.  Normalize your bowels.  Extremes of diarrhea or constipation will make hemorrhoids worse.  One soft bowel movement a day is the goal.  Fiber can help get your bowels regular Wet wipes instead of toilet paper Pain control with a NSAID such as ibuprofen (Advil) or naproxen (Aleve) or acetaminophen (Tylenol) around the clock.  Narcotics are constipating and should be minimized if possible Topical creams contain steroids (bydrocortisone) or local anesthetic (xylocaine) can help make pain and itching more tolerable.   EVALUATION If hemorrhoids are still causing problems, you could benefit by an evaluation by a surgeon.  The surgeon will obtain a history and examine you.  If hemorrhoids are diagnosed, some therapies can be offered in the office, usually with an anoscope into the less sensitive area of the rectum: -injection of hemorrhoids (sclerotherapy) can scar the blood vessels of the swollen/enlarged hemorrhoids to  help shrink them down to a more normal size -rubber banding of the enlarged hemorrhoids to help shrink them down to a more normal size -drainage of the blood clot causing a thrombosed hemorrhoid,  to relieve the severe pain   While 90% of the time such problems from hemorrhoids can be managed without preceding to surgery, sometimes the hemorrhoids require a operation to control the problem (uncontrolled bleeding, prolapse, pain, etc.).   This involves being placed under general anesthesia where the surgeon can confirm the diagnosis and remove, suture, or staple the hemorrhoid(s).  Your surgeon can help you treat the problem appropriately.    Pelvic floor muscle training exercises  can help strengthen the muscles under the uterus, bladder, and bowel (large intestine). They can help both men and women  who have problems with urine leakage or bowel control.  A pelvic floor muscle training exercise is like pretending that you have to urinate, and then holding it. You relax and tighten the muscles that control urine flow. It's important to find the right muscles to tighten.  The next time you have to urinate, start to go and then stop. Feel the muscles in your vagina, bladder, or anus get tight and move up. These are the pelvic floor muscles. If you feel them tighten, you've done the exercise right. If you are still not sure whether you are tightening the right muscles, keep in mind that all of the muscles of the pelvic floor relax and contract at the same time. Because these muscles control the bladder, rectum, and vagina, the following tips may help: Women: Insert a finger into your vagina. Tighten the muscles as if you are holding in your urine, then let go. You should feel the muscles tighten and move up and down.  Men: Insert a finger into your rectum. Tighten the muscles as if you are holding in your urine, then let go. You should feel the muscles tighten and move up and down. These are the same  muscles you would tighten if you were trying to prevent yourself from passing gas.  It is very important that you keep the following muscles relaxed while doing pelvic floor muscle training exercises: Abdominal  Buttocks (the deeper, anal sphincter muscle should contract)  Thigh   A woman can also strengthen these muscles by using a vaginal cone, which is a weighted device that is inserted into the vagina. Then you try to tighten the pelvic floor muscles to hold the device in place. If you are unsure whether you are doing the pelvic floor muscle training correctly, you can use biofeedback and electrical stimulation to help find the correct muscle group to work. Biofeedback is a method of positive reinforcement. Electrodes are placed on the abdomen and along the anal area. Some therapists place a sensor in the vagina in women or anus in men to monitor the contraction of pelvic floor muscles.  A monitor will display a graph showing which muscles are contracting and which are at rest. The therapist can help find the right muscles for performing pelvic floor muscle training exercises.   PERFORMING PELVIC FLOOR EXERCISES: 1. Begin by emptying your bladder. 2. Tighten the pelvic floor muscles and hold for a count of 10. 3. Relax the muscles completely for a count of 10. 4. Do 10 repititions, 3 to 5 times a day (morning, afternoon, and night). You can do these exercises at any time and any place. Most people prefer to do the exercises while lying down or sitting in a chair. After 4 - 6 weeks, most people notice some improvement. It may take as long as 3 months to see a major change. After a couple of weeks, you can also try doing a single pelvic floor contraction at times when you are likely to leak (for example, while getting out of a chair). A word of caution: Some people feel that they can speed up the progress by increasing the number of repetitions and the frequency of exercises. However,  over-exercising can instead cause muscle fatigue and increase urine leakage. If you feel any discomfort in your abdomen or back while doing these exercises, you are probably doing them wrong. Breathe deeply and relax your body when you are doing these exercises. Make sure you are not tightening your stomach,  thigh, buttock, or chest muscles. When done the right way, pelvic floor muscle exercises have been shown to be very effective at improving urinary continence. Alternative Names Kegel exercises  GETTING TO [place]. Irregular bowel habits such as constipation and diarrhea can lead to many problems over time.  Having one soft bowel movement a day is the most important way to prevent further problems.  The anorectal canal is designed to handle stretching and feces to safely manage our ability to get rid of solid waste (feces, poop, stool) out of our body.  BUT, hard constipated stools can act like ripping concrete bricks and diarrhea can be a burning fire to this very sensitive area of our body, causing inflamed hemorrhoids, anal fissures, increasing risk is perirectal abscesses, abdominal pain/bloating, an making irritable bowel worse.     The goal: ONE SOFT BOWEL MOVEMENT A DAY!  To have soft, regular bowel movements:    Drink at least 8 tall glasses of water a day.     Take plenty of fiber.  Fiber is the undigested part of plant food that passes into the colon, acting s "natures broom" to encourage bowel motility and movement.  Fiber can absorb and hold large amounts of water. This results in a larger, bulkier stool, which is soft and easier to pass. Work gradually over several weeks up to 6 servings a day of fiber (25g a day even more if needed) in the form of: o Vegetables -- Root (potatoes, carrots, turnips), leafy green (lettuce, salad greens, celery, spinach), or cooked high residue (cabbage, broccoli, etc) o Fruit -- Fresh (unpeeled skin & pulp), Dried (prunes, apricots, cherries, etc  ),  or stewed ( applesauce)  o Whole grain breads, pasta, etc (whole wheat)  o Bran cereals    Bulking Agents -- This type of water-retaining fiber generally is easily obtained each day by one of the following:  o Psyllium bran -- The psyllium plant is remarkable because its ground seeds can retain so much water. This product is available as Metamucil, Konsyl, Effersyllium, Per Diem Fiber, or the less expensive generic preparation in drug and health food stores. Although labeled a laxative, it really is not a laxative.  o Methylcellulose -- This is another fiber derived from wood which also retains water. It is available as Citrucel. o Polyethylene Glycol - and "artificial" fiber commonly called Miralax or Glycolax.  It is helpful for people with gassy or bloated feelings with regular fiber o Flax Seed - a less gassy fiber than psyllium   No reading or other relaxing activity while on the toilet. If bowel movements take longer than 5 minutes, you are too constipated   AVOID CONSTIPATION.  High fiber and water intake usually takes care of this.  Sometimes a laxative is needed to stimulate more frequent bowel movements, but    Laxatives are not a good long-term solution as it can wear the colon out. o Osmotics (Milk of Magnesia, Fleets phosphosoda, Magnesium citrate, MiraLax, GoLytely) are safer than  o Stimulants (Senokot, Castor Oil, Dulcolax, Ex Lax)    o Do not take laxatives for more than 7days in a row.    IF SEVERELY CONSTIPATED, try a Bowel Retraining Program: o Do not use laxatives.  o Eat a diet high in roughage, such as bran cereals and leafy vegetables.  o Drink six (6) ounces of prune or apricot juice each morning.  o Eat two (2) large servings of stewed fruit each day.  o  Take one (1) heaping tablespoon of a psyllium-based bulking agent twice a day. Use sugar-free sweetener when possible to avoid excessive calories.  o Eat a normal breakfast.  o Set aside 15 minutes after breakfast  to sit on the toilet, but do not strain to have a bowel movement.  o If you do not have a bowel movement by the third day, use an enema and repeat the above steps.    Controlling diarrhea o Switch to liquids and simpler foods for a few days to avoid stressing your intestines further. o Avoid dairy products (especially milk & ice cream) for a short time.  The intestines often can lose the ability to digest lactose when stressed. o Avoid foods that cause gassiness or bloating.  Typical foods include beans and other legumes, cabbage, broccoli, and dairy foods.  Every person has some sensitivity to other foods, so listen to our body and avoid those foods that trigger problems for you. o Adding fiber (Citrucel, Metamucil, psyllium, Miralax) gradually can help thicken stools by absorbing excess fluid and retrain the intestines to act more normally.  Slowly increase the dose over a few weeks.  Too much fiber too soon can backfire and cause cramping & bloating. o Probiotics (such as active yogurt, Align, etc) may help repopulate the intestines and colon with normal bacteria and calm down a sensitive digestive tract.  Most studies show it to be of mild help, though, and such products can be costly. o Medicines:   Bismuth subsalicylate (ex. Kayopectate, Pepto Bismol) every 30 minutes for up to 6 doses can help control diarrhea.  Avoid if pregnant.   Loperamide (Immodium) can slow down diarrhea.  Start with two tablets (4mg  total) first and then try one tablet every 6 hours.  Avoid if you are having fevers or severe pain.  If you are not better or start feeling worse, stop all medicines and call your doctor for advice o Call your doctor if you are getting worse or not better.  Sometimes further testing (cultures, endoscopy, X-ray studies, bloodwork, etc) may be needed to help diagnose and treat the cause of the diarrhea.  Neurogenic Bladder Neurogenic bladder is a loss of normal control of bladder function. This  is caused by damaged nerves, which can be a result of a variety of injuries and diseases.  The muscles and nerves of the urinary system work together to store urine and release urine at the right time. Nerves carry messages from the bladder to the brain and from the brain to the muscles of the bladder. In a neurogenic bladder, the nerves that are supposed to carry these messages do not work properly.There are 2 types of neurogenic bladder:  Overactive.  The bladder is unable to control when or how much to urinate. Even when only a small amount of urine is in the bladder, there may be an urge to urinate that cannot be controlled. This can result in wetting accidents (urge incontinence).  Underactive. The bladder holds much more urine than normal. Small amounts of urine leak out as bladder pressure builds because there is not a sensation that the bladder is full. This can also result in wetting accidents. CAUSES  Neurogenic bladder can be caused by many problems. These include:   Stroke.  Multiple sclerosis.  Infection.  Trauma and injuries to the spine, spinal cord, depending on the level in the back of the injury.  Diabetes.  Parkinson's Disease.  Brain and spinal cord tumors.  Birth defects  that affect the brain or spinal cord.  Guillain-Barr syndrome.  Complications of surgery involving the spine, spinal cord, or pelvis. SYMPTOMS  The following are the most common problems and symptoms of neurogenic bladder. However, each individual may experience symptoms differently.   Urinary tract infection symptoms:  Pain or burning when urinating.  Back or flank pain.  Cloudy or bloody urine.  Fever.  Kidney stone symptoms. Symptoms of kidney stones include:  Chills.  Shivering.  Feeling sick to your stomach (nausea) and/or vomiting.  Fever.  Loss of bladder control (urinary incontinence).  Small urine volume when emptying bladder (voiding).  Urinary frequency and  urgency.  Dribbling urine.  Loss of sensation of bladder fullness.  Kidney failure.  Infection in the blood stream (sepsis). DIAGNOSIS  When neurogenic bladder is suspected, both the nervous system and the bladder are examined. In addition to reviewing your complete medical history and having a physical exam, diagnostic procedures for neurogenic bladder may include:  X-rays of the spine.  Imaging tests of the kidneys, ureters, and bladder. These tests may include:  MRI.  CT scan.  Ultrasound.  Urodynamics or Cystometrogram (CMG). Thistests the nerves and muscles of the bladder. The test can show how much the bladder can hold and if it empties completely.  EMG of the sphincter . This is measure of the electrical activity of the sphincter muscle and this helps determine when the sphincter is contracting or squeezing down.  Video studies of the bladder and sphincter while you are urinating.  Cystoscopy . Looking inside the bladder with a telescope like instrument. TREATMENT  Specific treatment for neurogenic bladder will be determined by your caregiver based on:  Your age, overall health, and medical history.  Severity of symptoms.  Cause of the nerve damage.  Type of bladder problem present.  Your tolerance for specific medications, procedures, or therapies.  Expectations for the course of the condition.  Your opinion or preference. Treatment may include:  Antibiotic medicine.  Medications.  Insertion of a flexible tube (catheter) to empty the bladder.  Surgery to create an artificial sphincterto prevent urinary leakage.  Sacral nerve stimulation (SNS) to help stimulate the nerves in order to empty the bladder.  Sling surgery to hold the neck of the bladder and urethra in the proper position to prevent leakage.  Bladder augmentation.  Ileal loop surgery to connect a section of intestine to the ureters and positioned out to a opening on the abdomen. Urine  comes out and can be collected into a plastic bag.  Perineal pads may be used to catch urine. HOME CARE INSTRUCTIONS   Take all medications as prescribed by your caregiver.  Review your medications (both prescription and non-prescription) with your caregiver to make sure medications you are presently taking will not be harmful.  Periodic blood, urine, and imaging tests may be required. Follow your caregiver's advice regarding the timing of these.  Follow the catheter care instructions if you use a catheter.  Use disposiable underwear if you have bladder weakness. SEEK MEDICAL CARE IF:   You have increasing fatigue or weakness.  You find there is less and less control of the urine stream despite treatment.  You develop a loss of appetite or nausea.  You begin to have trouble inserting the catheter.  Your urine appears somewhat dark, cloudy, or has an unusual smell. SEEK IMMEDIATE MEDICAL CARE IF:   You develop worsening abdominal or back pain.  You cannot pass any urine.  Your urine becomes  bloody.  You experience a lot of burning while urinating.  You have a fever.  You keep vomiting.  You have bleeding with catheter insertion. Document Released: 08/24/2006 Document Revised: 01/28/2012 Document Reviewed: 12/08/2008 [facility] Patient Information 2014 [place].

## 2012-12-14 ENCOUNTER — Telehealth (INDEPENDENT_AMBULATORY_CARE_PROVIDER_SITE_OTHER): Payer: Self-pay | Admitting: *Deleted

## 2012-12-14 DIAGNOSIS — I2510 Atherosclerotic heart disease of native coronary artery without angina pectoris: Secondary | ICD-10-CM | POA: Diagnosis not present

## 2012-12-14 DIAGNOSIS — D649 Anemia, unspecified: Secondary | ICD-10-CM | POA: Diagnosis not present

## 2012-12-14 DIAGNOSIS — I10 Essential (primary) hypertension: Secondary | ICD-10-CM | POA: Diagnosis not present

## 2012-12-14 DIAGNOSIS — E1142 Type 2 diabetes mellitus with diabetic polyneuropathy: Secondary | ICD-10-CM | POA: Diagnosis not present

## 2012-12-14 DIAGNOSIS — L0231 Cutaneous abscess of buttock: Secondary | ICD-10-CM | POA: Diagnosis not present

## 2012-12-14 DIAGNOSIS — E1149 Type 2 diabetes mellitus with other diabetic neurological complication: Secondary | ICD-10-CM | POA: Diagnosis not present

## 2012-12-14 NOTE — Telephone Encounter (Signed)
I spoke with pt to inform him of his appt with [facility] outpatient rehab center at [facility] on 12/16/12 with an arrival time of 2:15pm.  I gave pt phone number of [phone] in case he needs to reschedule.

## 2012-12-16 ENCOUNTER — Ambulatory Visit: Payer: Medicare Other | Attending: Surgery | Admitting: Physical Therapy

## 2012-12-16 DIAGNOSIS — M629 Disorder of muscle, unspecified: Secondary | ICD-10-CM | POA: Diagnosis not present

## 2012-12-16 DIAGNOSIS — M2420 Disorder of ligament, unspecified site: Secondary | ICD-10-CM | POA: Insufficient documentation

## 2012-12-16 DIAGNOSIS — IMO0001 Reserved for inherently not codable concepts without codable children: Secondary | ICD-10-CM | POA: Insufficient documentation

## 2012-12-16 DIAGNOSIS — M25519 Pain in unspecified shoulder: Secondary | ICD-10-CM | POA: Insufficient documentation

## 2012-12-16 DIAGNOSIS — M545 Low back pain, unspecified: Secondary | ICD-10-CM | POA: Diagnosis not present

## 2012-12-20 DIAGNOSIS — E1142 Type 2 diabetes mellitus with diabetic polyneuropathy: Secondary | ICD-10-CM | POA: Diagnosis not present

## 2012-12-20 DIAGNOSIS — I2510 Atherosclerotic heart disease of native coronary artery without angina pectoris: Secondary | ICD-10-CM | POA: Diagnosis not present

## 2012-12-20 DIAGNOSIS — I10 Essential (primary) hypertension: Secondary | ICD-10-CM | POA: Diagnosis not present

## 2012-12-20 DIAGNOSIS — L0231 Cutaneous abscess of buttock: Secondary | ICD-10-CM | POA: Diagnosis not present

## 2012-12-20 DIAGNOSIS — E1149 Type 2 diabetes mellitus with other diabetic neurological complication: Secondary | ICD-10-CM | POA: Diagnosis not present

## 2012-12-20 DIAGNOSIS — D649 Anemia, unspecified: Secondary | ICD-10-CM | POA: Diagnosis not present

## 2012-12-21 DIAGNOSIS — L98499 Non-pressure chronic ulcer of skin of other sites with unspecified severity: Secondary | ICD-10-CM | POA: Diagnosis not present

## 2012-12-21 DIAGNOSIS — L89309 Pressure ulcer of unspecified buttock, unspecified stage: Secondary | ICD-10-CM | POA: Diagnosis not present

## 2012-12-21 DIAGNOSIS — L8992 Pressure ulcer of unspecified site, stage 2: Secondary | ICD-10-CM | POA: Diagnosis not present

## 2012-12-23 ENCOUNTER — Ambulatory Visit: Payer: Medicare Other | Attending: Surgery | Admitting: Physical Therapy

## 2012-12-29 DIAGNOSIS — E1142 Type 2 diabetes mellitus with diabetic polyneuropathy: Secondary | ICD-10-CM | POA: Diagnosis not present

## 2012-12-29 DIAGNOSIS — I2510 Atherosclerotic heart disease of native coronary artery without angina pectoris: Secondary | ICD-10-CM | POA: Diagnosis not present

## 2012-12-29 DIAGNOSIS — E1149 Type 2 diabetes mellitus with other diabetic neurological complication: Secondary | ICD-10-CM | POA: Diagnosis not present

## 2012-12-29 DIAGNOSIS — I10 Essential (primary) hypertension: Secondary | ICD-10-CM | POA: Diagnosis not present

## 2012-12-29 DIAGNOSIS — L0231 Cutaneous abscess of buttock: Secondary | ICD-10-CM | POA: Diagnosis not present

## 2012-12-29 DIAGNOSIS — D649 Anemia, unspecified: Secondary | ICD-10-CM | POA: Diagnosis not present

## 2012-12-30 ENCOUNTER — Ambulatory Visit: Payer: Medicare Other | Admitting: Physical Therapy

## 2013-01-03 DIAGNOSIS — E1142 Type 2 diabetes mellitus with diabetic polyneuropathy: Secondary | ICD-10-CM | POA: Diagnosis not present

## 2013-01-03 DIAGNOSIS — I2510 Atherosclerotic heart disease of native coronary artery without angina pectoris: Secondary | ICD-10-CM | POA: Diagnosis not present

## 2013-01-03 DIAGNOSIS — E1149 Type 2 diabetes mellitus with other diabetic neurological complication: Secondary | ICD-10-CM | POA: Diagnosis not present

## 2013-01-03 DIAGNOSIS — D649 Anemia, unspecified: Secondary | ICD-10-CM | POA: Diagnosis not present

## 2013-01-03 DIAGNOSIS — L0231 Cutaneous abscess of buttock: Secondary | ICD-10-CM | POA: Diagnosis not present

## 2013-01-03 DIAGNOSIS — I10 Essential (primary) hypertension: Secondary | ICD-10-CM | POA: Diagnosis not present

## 2013-01-04 ENCOUNTER — Other Ambulatory Visit: Payer: Self-pay

## 2013-01-04 MED ORDER — VALSARTAN 80 MG PO TABS: ORAL_TABLET | ORAL | Status: DC

## 2013-01-05 DIAGNOSIS — L0231 Cutaneous abscess of buttock: Secondary | ICD-10-CM | POA: Diagnosis not present

## 2013-01-05 DIAGNOSIS — I10 Essential (primary) hypertension: Secondary | ICD-10-CM | POA: Diagnosis not present

## 2013-01-05 DIAGNOSIS — I2510 Atherosclerotic heart disease of native coronary artery without angina pectoris: Secondary | ICD-10-CM | POA: Diagnosis not present

## 2013-01-05 DIAGNOSIS — E1142 Type 2 diabetes mellitus with diabetic polyneuropathy: Secondary | ICD-10-CM | POA: Diagnosis not present

## 2013-01-05 DIAGNOSIS — E1149 Type 2 diabetes mellitus with other diabetic neurological complication: Secondary | ICD-10-CM | POA: Diagnosis not present

## 2013-01-05 DIAGNOSIS — D649 Anemia, unspecified: Secondary | ICD-10-CM | POA: Diagnosis not present

## 2013-01-06 ENCOUNTER — Ambulatory Visit: Payer: Medicare Other | Attending: Surgery | Admitting: Physical Therapy

## 2013-01-06 DIAGNOSIS — M629 Disorder of muscle, unspecified: Secondary | ICD-10-CM | POA: Diagnosis not present

## 2013-01-06 DIAGNOSIS — IMO0001 Reserved for inherently not codable concepts without codable children: Secondary | ICD-10-CM | POA: Insufficient documentation

## 2013-01-06 DIAGNOSIS — M545 Low back pain, unspecified: Secondary | ICD-10-CM | POA: Insufficient documentation

## 2013-01-06 DIAGNOSIS — M2420 Disorder of ligament, unspecified site: Secondary | ICD-10-CM | POA: Insufficient documentation

## 2013-01-06 DIAGNOSIS — M25519 Pain in unspecified shoulder: Secondary | ICD-10-CM | POA: Insufficient documentation

## 2013-01-10 DIAGNOSIS — I10 Essential (primary) hypertension: Secondary | ICD-10-CM | POA: Diagnosis not present

## 2013-01-10 DIAGNOSIS — I2510 Atherosclerotic heart disease of native coronary artery without angina pectoris: Secondary | ICD-10-CM | POA: Diagnosis not present

## 2013-01-10 DIAGNOSIS — E1142 Type 2 diabetes mellitus with diabetic polyneuropathy: Secondary | ICD-10-CM | POA: Diagnosis not present

## 2013-01-10 DIAGNOSIS — D649 Anemia, unspecified: Secondary | ICD-10-CM | POA: Diagnosis not present

## 2013-01-10 DIAGNOSIS — E1149 Type 2 diabetes mellitus with other diabetic neurological complication: Secondary | ICD-10-CM | POA: Diagnosis not present

## 2013-01-10 DIAGNOSIS — L0231 Cutaneous abscess of buttock: Secondary | ICD-10-CM | POA: Diagnosis not present

## 2013-01-13 ENCOUNTER — Ambulatory Visit: Payer: Medicare Other | Admitting: Physical Therapy

## 2013-01-17 ENCOUNTER — Encounter: Payer: Medicare Other | Admitting: Physical Therapy

## 2013-01-17 ENCOUNTER — Telehealth: Payer: Self-pay | Admitting: Family Medicine

## 2013-01-18 NOTE — Telephone Encounter (Signed)
This may be refilled, I am not sure which medication he is referring to.

## 2013-01-18 NOTE — Telephone Encounter (Signed)
dwm to address

## 2013-01-19 ENCOUNTER — Encounter (HOSPITAL_COMMUNITY): Payer: Self-pay | Admitting: Emergency Medicine

## 2013-01-19 ENCOUNTER — Inpatient Hospital Stay (HOSPITAL_COMMUNITY): Payer: Medicare Other

## 2013-01-19 ENCOUNTER — Inpatient Hospital Stay (HOSPITAL_COMMUNITY)
Admission: EM | Admit: 2013-01-19 | Discharge: 2013-01-24 | DRG: 602 | Disposition: A | Discharge status: Home Health | Payer: Medicare Other | Attending: Internal Medicine | Admitting: Internal Medicine

## 2013-01-19 DIAGNOSIS — L03317 Cellulitis of buttock: Secondary | ICD-10-CM | POA: Diagnosis not present

## 2013-01-19 DIAGNOSIS — L0390 Cellulitis, unspecified: Secondary | ICD-10-CM | POA: Diagnosis present

## 2013-01-19 DIAGNOSIS — E1121 Type 2 diabetes mellitus with diabetic nephropathy: Secondary | ICD-10-CM | POA: Diagnosis present

## 2013-01-19 DIAGNOSIS — E1129 Type 2 diabetes mellitus with other diabetic kidney complication: Secondary | ICD-10-CM | POA: Diagnosis present

## 2013-01-19 DIAGNOSIS — F4310 Post-traumatic stress disorder, unspecified: Secondary | ICD-10-CM | POA: Diagnosis present

## 2013-01-19 DIAGNOSIS — L8990 Pressure ulcer of unspecified site, unspecified stage: Secondary | ICD-10-CM | POA: Diagnosis present

## 2013-01-19 DIAGNOSIS — E1149 Type 2 diabetes mellitus with other diabetic neurological complication: Secondary | ICD-10-CM | POA: Diagnosis not present

## 2013-01-19 DIAGNOSIS — L0291 Cutaneous abscess, unspecified: Secondary | ICD-10-CM | POA: Diagnosis not present

## 2013-01-19 DIAGNOSIS — Q059 Spina bifida, unspecified: Secondary | ICD-10-CM | POA: Diagnosis not present

## 2013-01-19 DIAGNOSIS — I129 Hypertensive chronic kidney disease with stage 1 through stage 4 chronic kidney disease, or unspecified chronic kidney disease: Secondary | ICD-10-CM | POA: Diagnosis present

## 2013-01-19 DIAGNOSIS — N189 Chronic kidney disease, unspecified: Secondary | ICD-10-CM | POA: Diagnosis not present

## 2013-01-19 DIAGNOSIS — L8993 Pressure ulcer of unspecified site, stage 3: Secondary | ICD-10-CM | POA: Diagnosis present

## 2013-01-19 DIAGNOSIS — E46 Unspecified protein-calorie malnutrition: Secondary | ICD-10-CM | POA: Diagnosis present

## 2013-01-19 DIAGNOSIS — L0231 Cutaneous abscess of buttock: Secondary | ICD-10-CM | POA: Diagnosis not present

## 2013-01-19 DIAGNOSIS — D649 Anemia, unspecified: Secondary | ICD-10-CM | POA: Diagnosis not present

## 2013-01-19 DIAGNOSIS — IMO0002 Reserved for concepts with insufficient information to code with codable children: Secondary | ICD-10-CM | POA: Diagnosis present

## 2013-01-19 DIAGNOSIS — E785 Hyperlipidemia, unspecified: Secondary | ICD-10-CM | POA: Diagnosis present

## 2013-01-19 DIAGNOSIS — I10 Essential (primary) hypertension: Secondary | ICD-10-CM

## 2013-01-19 DIAGNOSIS — Z951 Presence of aortocoronary bypass graft: Secondary | ICD-10-CM | POA: Diagnosis not present

## 2013-01-19 DIAGNOSIS — Z7982 Long term (current) use of aspirin: Secondary | ICD-10-CM

## 2013-01-19 DIAGNOSIS — K650 Generalized (acute) peritonitis: Secondary | ICD-10-CM | POA: Diagnosis not present

## 2013-01-19 DIAGNOSIS — N390 Urinary tract infection, site not specified: Secondary | ICD-10-CM | POA: Diagnosis present

## 2013-01-19 DIAGNOSIS — E1142 Type 2 diabetes mellitus with diabetic polyneuropathy: Secondary | ICD-10-CM | POA: Diagnosis not present

## 2013-01-19 DIAGNOSIS — N058 Unspecified nephritic syndrome with other morphologic changes: Secondary | ICD-10-CM | POA: Diagnosis present

## 2013-01-19 DIAGNOSIS — E119 Type 2 diabetes mellitus without complications: Secondary | ICD-10-CM

## 2013-01-19 DIAGNOSIS — Z87891 Personal history of nicotine dependence: Secondary | ICD-10-CM

## 2013-01-19 DIAGNOSIS — I2510 Atherosclerotic heart disease of native coronary artery without angina pectoris: Secondary | ICD-10-CM | POA: Diagnosis present

## 2013-01-19 HISTORY — DX: Unspecified osteoarthritis, unspecified site: M19.90

## 2013-01-19 HISTORY — DX: Other chronic pain: G89.29

## 2013-01-19 HISTORY — DX: Contact with and (suspected) exposure to other hazardous, chiefly nonmedicinal, chemicals: Z77.098

## 2013-01-19 HISTORY — DX: Depression, unspecified: F32.A

## 2013-01-19 HISTORY — DX: Shortness of breath: R06.02

## 2013-01-19 HISTORY — DX: Unspecified urinary incontinence: R32

## 2013-01-19 HISTORY — DX: Low back pain: M54.5

## 2013-01-19 HISTORY — DX: Low back pain, unspecified: M54.50

## 2013-01-19 HISTORY — DX: Anxiety disorder, unspecified: F41.9

## 2013-01-19 HISTORY — DX: Cellulitis of buttock: L03.317

## 2013-01-19 HISTORY — DX: Acute myocardial infarction, unspecified: I21.9

## 2013-01-19 HISTORY — DX: Major depressive disorder, single episode, unspecified: F32.9

## 2013-01-19 LAB — CBC WITH DIFFERENTIAL/PLATELET
Basophils Relative: 0 % (ref 0–1)
Eosinophils Absolute: 0.1 10*3/uL (ref 0.0–0.7)
HCT: 33.1 % — ABNORMAL LOW (ref 39.0–52.0)
Hemoglobin: 11.1 g/dL — ABNORMAL LOW (ref 13.0–17.0)
Lymphocytes Relative: 18 % (ref 12–46)
Lymphs Abs: 2.6 10*3/uL (ref 0.7–4.0)
MCH: 30.6 pg (ref 26.0–34.0)
MCHC: 33.5 g/dL (ref 30.0–36.0)
MCV: 91.2 fL (ref 78.0–100.0)
Monocytes Absolute: 1.6 10*3/uL — ABNORMAL HIGH (ref 0.1–1.0)
Monocytes Relative: 11 % (ref 3–12)
Neutrophils Relative %: 70 % (ref 43–77)
Platelets: 246 10*3/uL (ref 150–400)
RBC: 3.63 MIL/uL — ABNORMAL LOW (ref 4.22–5.81)
RDW: 13.4 % (ref 11.5–15.5)
WBC: 14.1 10*3/uL — ABNORMAL HIGH (ref 4.0–10.5)

## 2013-01-19 LAB — URINALYSIS, ROUTINE W REFLEX MICROSCOPIC
Bilirubin Urine: NEGATIVE
Glucose, UA: NEGATIVE mg/dL
Hgb urine dipstick: NEGATIVE
Specific Gravity, Urine: 1.02 (ref 1.005–1.030)
pH: 5.5 (ref 5.0–8.0)

## 2013-01-19 LAB — BASIC METABOLIC PANEL
BUN: 22 mg/dL (ref 6–23)
CO2: 28 mEq/L (ref 19–32)
GFR calc Af Amer: 66 mL/min — ABNORMAL LOW (ref >90)
Glucose, Bld: 212 mg/dL — ABNORMAL HIGH (ref 70–99)
Potassium: 5 mEq/L (ref 3.5–5.1)
Sodium: 138 mEq/L (ref 135–145)

## 2013-01-19 MED ORDER — HEPARIN SODIUM (PORCINE) 5000 UNIT/ML IJ SOLN
5000.0000 [IU] | Freq: Three times a day (TID) | INTRAMUSCULAR | Status: DC
  Administered 2013-01-19 – 2013-01-24 (×14): 5000 [IU] via SUBCUTANEOUS
  Filled 2013-01-19 (×17): qty 1

## 2013-01-19 MED ORDER — HYDROCORTISONE ACETATE 25 MG RE SUPP
25.0000 mg | Freq: Two times a day (BID) | RECTAL | Status: DC | PRN
  Filled 2013-01-19: qty 1

## 2013-01-19 MED ORDER — VANCOMYCIN HCL 10 G IV SOLR: 1500.0000 mg | INTRAVENOUS | Status: DC

## 2013-01-19 MED ORDER — IRBESARTAN 75 MG PO TABS
75.0000 mg | ORAL_TABLET | Freq: Every day | ORAL | Status: DC
  Administered 2013-01-20 – 2013-01-24 (×5): 75 mg via ORAL
  Filled 2013-01-19 (×5): qty 1

## 2013-01-19 MED ORDER — DEXTROSE 5 % IV SOLN
1.0000 g | Freq: Once | INTRAVENOUS | Status: AC
  Administered 2013-01-19: 1 g via INTRAVENOUS
  Filled 2013-01-19: qty 10

## 2013-01-19 MED ORDER — ONDANSETRON HCL 4 MG/2ML IJ SOLN: 4.0000 mg | Freq: Four times a day (QID) | INTRAMUSCULAR | Status: DC | PRN

## 2013-01-19 MED ORDER — VANCOMYCIN HCL 10 G IV SOLR
1500.0000 mg | INTRAVENOUS | Status: DC
  Administered 2013-01-20 – 2013-01-22 (×3): 1500 mg via INTRAVENOUS
  Filled 2013-01-19 (×4): qty 1500

## 2013-01-19 MED ORDER — ACETAMINOPHEN 500 MG PO TABS: 500.0000 mg | ORAL_TABLET | Freq: Four times a day (QID) | ORAL | Status: DC | PRN

## 2013-01-19 MED ORDER — NYSTATIN 100000 UNIT/GM EX POWD: 100000.0000 g | Freq: Three times a day (TID) | CUTANEOUS | Status: DC | PRN

## 2013-01-19 MED ORDER — ALUM & MAG HYDROXIDE-SIMETH 200-200-20 MG/5ML PO SUSP: 30.0000 mL | Freq: Four times a day (QID) | ORAL | Status: DC | PRN

## 2013-01-19 MED ORDER — VANCOMYCIN HCL 10 G IV SOLR
2000.0000 mg | Freq: Once | INTRAVENOUS | Status: AC
  Administered 2013-01-19: 2000 mg via INTRAVENOUS
  Filled 2013-01-19: qty 2000

## 2013-01-19 MED ORDER — METOPROLOL TARTRATE 25 MG PO TABS
25.0000 mg | ORAL_TABLET | Freq: Two times a day (BID) | ORAL | Status: DC
  Administered 2013-01-19 – 2013-01-24 (×9): 25 mg via ORAL
  Filled 2013-01-19 (×11): qty 1

## 2013-01-19 MED ORDER — PIPERACILLIN-TAZOBACTAM 3.375 G IVPB
3.3750 g | Freq: Three times a day (TID) | INTRAVENOUS | Status: DC
  Administered 2013-01-19 – 2013-01-23 (×12): 3.375 g via INTRAVENOUS
  Filled 2013-01-19 (×15): qty 50

## 2013-01-19 MED ORDER — ACETAMINOPHEN 325 MG PO TABS: 650.0000 mg | ORAL_TABLET | Freq: Four times a day (QID) | ORAL | Status: DC | PRN

## 2013-01-19 MED ORDER — MORPHINE SULFATE 2 MG/ML IJ SOLN: 1.0000 mg | INTRAMUSCULAR | Status: DC | PRN

## 2013-01-19 MED ORDER — ASPIRIN 81 MG PO TABS: 81.0000 mg | ORAL_TABLET | Freq: Every day | ORAL | Status: DC

## 2013-01-19 MED ORDER — ACETAMINOPHEN 650 MG RE SUPP: 650.0000 mg | Freq: Four times a day (QID) | RECTAL | Status: DC | PRN

## 2013-01-19 MED ORDER — BUPROPION HCL ER (XL) 300 MG PO TB24
300.0000 mg | ORAL_TABLET | Freq: Every day | ORAL | Status: DC
  Administered 2013-01-20 – 2013-01-24 (×5): 300 mg via ORAL
  Filled 2013-01-19 (×5): qty 1

## 2013-01-19 MED ORDER — INSULIN ASPART 100 UNIT/ML ~~LOC~~ SOLN
0.0000 [IU] | Freq: Three times a day (TID) | SUBCUTANEOUS | Status: DC
  Administered 2013-01-20 – 2013-01-21 (×2): 2 [IU] via SUBCUTANEOUS
  Administered 2013-01-21: 3 [IU] via SUBCUTANEOUS
  Administered 2013-01-22: 1 [IU] via SUBCUTANEOUS
  Administered 2013-01-22: 2 [IU] via SUBCUTANEOUS
  Administered 2013-01-23: 3 [IU] via SUBCUTANEOUS
  Administered 2013-01-23: 1 [IU] via SUBCUTANEOUS
  Administered 2013-01-23: 3 [IU] via SUBCUTANEOUS
  Administered 2013-01-24 (×2): 2 [IU] via SUBCUTANEOUS

## 2013-01-19 MED ORDER — SODIUM CHLORIDE 0.9 % IV SOLN
INTRAVENOUS | Status: DC
  Administered 2013-01-21: 1000 mL via INTRAVENOUS
  Administered 2013-01-22: 04:00:00 via INTRAVENOUS

## 2013-01-19 MED ORDER — POLYETHYLENE GLYCOL 3350 17 GM/SCOOP PO POWD
17.0000 g | Freq: Two times a day (BID) | ORAL | Status: DC | PRN
  Filled 2013-01-19: qty 255

## 2013-01-19 MED ORDER — VANCOMYCIN HCL 10 G IV SOLR
2000.0000 mg | Freq: Once | INTRAVENOUS | Status: DC
  Filled 2013-01-19: qty 2000

## 2013-01-19 MED ORDER — HYDROCODONE-ACETAMINOPHEN 5-325 MG PO TABS: 1.0000 | ORAL_TABLET | ORAL | Status: DC | PRN

## 2013-01-19 MED ORDER — ASPIRIN EC 81 MG PO TBEC
81.0000 mg | DELAYED_RELEASE_TABLET | Freq: Every day | ORAL | Status: DC
  Administered 2013-01-20 – 2013-01-24 (×5): 81 mg via ORAL
  Filled 2013-01-19 (×5): qty 1

## 2013-01-19 MED ORDER — HYDROCORTISONE 2.5 % RE CREA: TOPICAL_CREAM | Freq: Two times a day (BID) | RECTAL | Status: DC | PRN

## 2013-01-19 MED ORDER — IOHEXOL 300 MG/ML  SOLN
100.0000 mL | Freq: Once | INTRAMUSCULAR | Status: AC | PRN
  Administered 2013-01-19: 100 mL via INTRAVENOUS

## 2013-01-19 MED ORDER — ONDANSETRON HCL 4 MG PO TABS: 4.0000 mg | ORAL_TABLET | Freq: Four times a day (QID) | ORAL | Status: DC | PRN

## 2013-01-19 NOTE — ED Notes (Signed)
Did in and out cath on patient yellow urine in return

## 2013-01-19 NOTE — ED Notes (Signed)
Pt sent from wound center for eval for ulcers to buttocks that are increasing in size; pt sts foul odor and sts UTI sx as well

## 2013-01-19 NOTE — ED Provider Notes (Signed)
Medical screening examination/treatment/procedure(s) were conducted as a shared visit with non-physician practitioner(s) and myself.  I personally evaluated the patient during the encounter.  EKG Interpretation   None       Pt is an 79 y.o. male with a history of diabetes, hypertension, hyperlipidemia spina bifida who presents emergency department for evaluation for a buttock wound. He reports his weight has been there since June it started off as quarter size it is progressively got worse. Over the past week has been increasingly painful, has tripled in size, has had foul-smelling purulent drainage. He also reports low-grade fevers at home. States his glucose has been controlled. On exam, patient is hemodynamically stable. He has a large buttock wound, stage 2-3, no subcutaneous emphysema, minimal amount of purulent drainage, large area of associated cellulitis. He is blood work shows leukocytosis of 14.1 with left shift. He also has a urinary tract infection. We'll obtain wound culture and blood cultures. Given patient's age, comorbidities including diabetes, surrounding cellulitis, will admit for IV antibiotics and wound care.  Sanders, DO 01/19/13 1701

## 2013-01-19 NOTE — Progress Notes (Signed)
[facility]  Patient Status: Active (receiving services up to time of hospitalization)  [facility] is providing the following services: RN  If patient discharges after hours, please call [phone].   [name] 01/19/2013, 7:11 PM

## 2013-01-19 NOTE — H&P (Signed)
[facility] History and Physical  CLEVELAND ADELSON [id number] DOB: [birth year] DOA: 01/19/2013  Referring physician: Hyman Bible, PA-C PCP: Redge Gainer, MD  Specialists:   Chief Complaint: Worsening of gluteal cellulitis  HPI: [name] is a 79 y.o. male with past medical history of diabetes, hypertension and history of spina bifida. Patient given to the hospital because of worsening of bilateral gluteal cellulitis. Patient has bilateral gluteal cellulitis for the past 6 months, he's been following with wound center in [place], [place]. About 10 days ago he tried Medihoney gel over his wounds, his home health nurse checked on him today and the wound about tripled since last week. She called his doctor in the wound center and they asked him to bring into the ER. In the ED has bilateral gluteal cellulitis with purulent discharge, his UA also is consistent with UTI and has WBC count of 14.1.  Review of Systems:  Constitutional: negative for anorexia, fevers and sweats Eyes: negative for irritation, redness and visual disturbance Ears, nose, mouth, throat, and face: negative for earaches, epistaxis, nasal congestion and sore throat Respiratory: negative for cough, dyspnea on exertion, sputum and wheezing Cardiovascular: negative for chest pain, dyspnea, lower extremity edema, orthopnea, palpitations and syncope Gastrointestinal: negative for abdominal pain, constipation, diarrhea, melena, nausea and vomiting Genitourinary:negative for dysuria, frequency and hematuria Hematologic/lymphatic: negative for bleeding, easy bruising and lymphadenopathy Musculoskeletal: Complaining about drainage from his gluteal/perianal area Neurological: negative for coordination problems, gait problems, headaches and weakness Endocrine: negative for diabetic symptoms including polydipsia, polyuria and weight loss Allergic/Immunologic: negative for anaphylaxis, hay fever and urticaria   Past Medical  History  Diagnosis Date  . CAD (coronary artery disease)     native vessel  . Diabetes mellitus type II   . Hyperlipidemia   . HTN (hypertension)   . Chronic renal insufficiency   . Carotid artery disease     nonobstructive  . Anemia   . Hypokalemia   . Malnutrition     protein-calorie  . PTSD (post-traumatic stress disorder)   . Cecal diverticulitis 2008    drained   Past Surgical History  Procedure Laterality Date  . Spinda bifida surgery    . Left knee surgery    . Coronary artery bypass graft  1998?  Marland Kitchen Hemorrhoid surgery  1980s?   Social History:  reports that he quit smoking about 32 years ago. His smoking use included Cigarettes. He started smoking about 64 years ago. He smoked 2.00 packs per day. He has quit using smokeless tobacco. He reports that he does not drink alcohol or use illicit drugs. Lives at home with his wife, uses walker for ambulation for the past 6 years.  Allergies  Allergen Reactions  . Aspirin     In high doses  . Sulfonamide Derivatives     REACTION: pruitis patient cant remember its been so long    . Nitrofurantoin Rash    History reviewed. No pertinent family history.   Prior to Admission medications   Medication Sig Start Date End Date Taking? Authorizing Provider  acetaminophen (TYLENOL) 500 MG tablet Take 500 mg by mouth every 6 (six) hours as needed for mild pain.   Yes Historical Provider, MD  Ascorbic Acid (VITAMIN C) 1000 MG tablet Take 1,000 mg by mouth daily.     Yes Historical Provider, MD  aspirin 81 MG tablet Take 81 mg by mouth daily.     Yes Historical Provider, MD  buPROPion (WELLBUTRIN XL) 300 MG 24 hr  tablet Take 300 mg by mouth every morning.     Yes Historical Provider, MD  Cholecalciferol (VITAMIN D-3) 5000 UNITS TABS Take 1 tablet by mouth daily.     Yes Historical Provider, MD  Coenzyme Q10 (CO Q-10) 100 MG CAPS Take 1 capsule by mouth 2 (two) times daily.    Yes Historical Provider, MD  ezetimibe-simvastatin  (VYTORIN) 10-20 MG per tablet Take 1 tablet by mouth 2 (two) times daily.   Yes Historical Provider, MD  glipiZIDE (GLUCOTROL XL) 10 MG 24 hr tablet Take 10 mg by mouth daily.   Yes Historical Provider, MD  hydrocortisone (ANUSOL-HC) 25 MG suppository Place 1 suppository (25 mg total) rectally 2 (two) times daily. 11/29/12  Yes Vernie Shanks, MD  hydrocortisone (PROCTOCREAM-HC) 2.5 % rectal cream Place rectally 2 (two) times daily. 11/18/12  Yes Chipper Herb, MD  metoprolol tartrate (LOPRESSOR) 25 MG tablet Take 25 mg by mouth 2 (two) times daily.   Yes Historical Provider, MD  Multiple Vitamins-Minerals (PRESERVISION AREDS 2 PO) Take 1 capsule by mouth 2 (two) times daily.   Yes Historical Provider, MD  nitroGLYCERIN (NITROSTAT) 0.4 MG SL tablet Place 1 tablet (0.4 mg total) under the tongue every 5 (five) minutes as needed. 05/13/12  Yes Renella Cunas, MD  NON FORMULARY cran-actin 100mg  1 tablet daily   Yes Historical Provider, MD  nystatin (MYCOSTATIN/NYSTOP) 100000 UNIT/GM POWD Apply 100,000 g topically 3 (three) times daily. 09/24/12  Yes Lysbeth Penner, FNP  nystatin-triamcinolone ointment [facility]) Apply topically 2 (two) times daily. 09/24/12  Yes Lysbeth Penner, FNP  Omega-3 Fatty Acids (FISH OIL) 1000 MG CAPS Take 1 capsule by mouth 2 (two) times daily.     Yes Historical Provider, MD  polyethylene glycol powder (GLYCOLAX/MIRALAX) powder Take 17 g by mouth 2 (two) times daily as needed. 11/29/12  Yes Vernie Shanks, MD  valsartan (DIOVAN) 80 MG tablet Take 80 mg by mouth daily.   Yes Historical Provider, MD   Physical Exam: Filed Vitals:   01/19/13 1744  BP: 138/66  Pulse: 71  Temp:   Resp: 16   General appearance: alert, cooperative and no distress  Head: Normocephalic, without obvious abnormality, atraumatic  Eyes: conjunctivae/corneas clear. PERRL, EOM's intact. Fundi benign.  Nose: Nares normal. Septum midline. Mucosa normal. No drainage or sinus tenderness.  Throat: lips,  mucosa, and tongue normal; teeth and gums normal  Neck: Supple, no masses, no cervical lymphadenopathy, no JVD appreciated, no meningeal signs Resp: clear to auscultation bilaterally  Chest wall: no tenderness  Cardio: regular rate and rhythm, S1, S2 normal, no murmur, click, rub or gallop  GI: soft, non-tender; bowel sounds normal; no masses, no organomegaly  Extremities: extremities normal, atraumatic, no cyanosis or edema  Skin: Bilateral gluteal cellulitis with purulent drainage. NOT sacral wound Neurologic: Alert and oriented X 3, normal strength and tone. Normal symmetric reflexes. Normal coordination and gait   Labs on Admission:  Basic Metabolic Panel:  Recent Labs Lab 01/19/13 1455  NA 138  K 5.0  CL 103  CO2 28  GLUCOSE 212*  BUN 22  CREATININE 1.17  CALCIUM 9.0   Liver Function Tests: No results found for this basename: AST, ALT, ALKPHOS, BILITOT, PROT, ALBUMIN,  in the last 168 hours No results found for this basename: LIPASE, AMYLASE,  in the last 168 hours No results found for this basename: AMMONIA,  in the last 168 hours CBC:  Recent Labs Lab 01/19/13 1455  WBC 14.1*  NEUTROABS 9.8*  HGB 11.1*  HCT 33.1*  MCV 91.2  PLT 246   Cardiac Enzymes: No results found for this basename: CKTOTAL, CKMB, CKMBINDEX, TROPONINI,  in the last 168 hours  BNP (last 3 results) No results found for this basename: PROBNP,  in the last 8760 hours CBG: No results found for this basename: GLUCAP,  in the last 168 hours  Radiological Exams on Admission: No results found.  EKG: Independently reviewed.   Assessment/Plan Principal Problem:   Abscess or cellulitis of gluteal region Active Problems:   Type 2 diabetes mellitus with diabetic nephropathy   HYPERTENSION, UNSPECIFIED   Spina bifida   Cellulitis   UTI (lower urinary tract infection)    Cellulitis of the gluteal region -Bilateral cellulitis involving the gluteal region, symmetrical kissing  ulcers. -Recent worsening, now with purulent discharge. -Of the CT scan of pelvis to rule out abscess formation. -Wound and blood cultures obtained in the emergency department. -Started on Zosyn and vancomycin.  UTI -Urinalysis consistent with UTI. -Started on Zosyn to cover for the cellulitis, this should be sufficient for the UTI.  Type 2 diabetes mellitus -On glipizide, hold home medications. -Carbohydrate modified diet, check hemoglobin A1c. -Utilize insulin sliding scale.  Hypertension -Continue home medications.  Spina bifida -Obviously when he was younger was not affecting his mobility. 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  Code Status: Full code Family Communication: Plan discussed with the patient Disposition Plan: Inpatient, MedSurg, anticipate length of stay to be greater than 2 mid nights.  Time spent: 70 minutes  [facility] Pager [phone]  If 7PM-7AM, please contact night-coverage [email or web address] Password [facility] 01/19/2013, 5:47 PM

## 2013-01-19 NOTE — Telephone Encounter (Signed)
Levaquin called into [facility], pt and [facility] aware. Dr. Laurance Flatten and Dietrich Pates both gave this nurse this order

## 2013-01-19 NOTE — Progress Notes (Addendum)
ANTIBIOTIC CONSULT NOTE - INITIAL  Pharmacy Consult for vancomycin Indication: Cellulitis  Allergies  Allergen Reactions  . Aspirin     In high doses  . Sulfonamide Derivatives     REACTION: pruitis patient cant remember its been so long    . Nitrofurantoin Rash    Patient Measurements:  Ht: 68in Wt per pt: 245lbs Adjusted Body Weight:   Vital Signs: Temp: 98.1 F (36.7 C) (11/26 1406) Temp src: Oral (11/26 1406) BP: 137/59 mmHg (11/26 1551) Pulse Rate: 67 (11/26 1551) Intake/Output from previous day:   Intake/Output from this shift:    Labs:  Recent Labs  01/19/13 1455  WBC 14.1*  HGB 11.1*  PLT 246  CREATININE 1.17   The CrCl is unknown because both a height and weight (above a minimum accepted value) are required for this calculation. No results found for this basename: VANCOTROUGH, VANCOPEAK, VANCORANDOM, GENTTROUGH, GENTPEAK, GENTRANDOM, TOBRATROUGH, TOBRAPEAK, TOBRARND, AMIKACINPEAK, AMIKACINTROU, AMIKACIN,  in the last 72 hours   Microbiology: No results found for this or any previous visit (from the past 720 hour(s)).  Medical History: Past Medical History  Diagnosis Date  . CAD (coronary artery disease)     native vessel  . Diabetes mellitus type II   . Hyperlipidemia   . HTN (hypertension)   . Chronic renal insufficiency   . Carotid artery disease     nonobstructive  . Anemia   . Hypokalemia   . Malnutrition     protein-calorie  . PTSD (post-traumatic stress disorder)   . Cecal diverticulitis 2008    drained    Medications:   (Not in a hospital admission) Scheduled:   Assessment: 79 yo who was seen in the ED for what it looks like cellulitis. He has been followed by outpt wound care for it. Vanc has been ordered for cellulitis. Adding zosyn now too.  Goal of Therapy:  Vancomycin trough level 10-15 mcg/ml  Plan:   Vancomycin 2gm IV x1 then 1.5gm IV q24 Level if needed at steady state Zosyn 3.375g IV q8

## 2013-01-19 NOTE — ED Notes (Signed)
Patient has pressure ulcers bilateral butt cheeks that have white, black and reddened area with a yellow fluid. Bilateral dressings that were applied by wound care center RN. There is a slight foul odor, patient denies any pain just a "nagging stinging sensation".

## 2013-01-19 NOTE — ED Provider Notes (Signed)
CSN: [id number]     Arrival date & time 01/19/13  1357 History   First MD Initiated Contact with Patient 01/19/13 1418     Chief Complaint  Patient presents with  . Wound Check   (Consider location/radiation/quality/duration/timing/severity/associated sxs/prior Treatment) HPI Comments: Patient with a history of Spina Bifida presents today with a wound to his bilateral buttocks.  He reports that the wound has been present since June.  He has been followed by his PCP, Wound Management, and Home Health for this wound.  He reports that the Home Health nurse comes into his home once a week to change the dressing.  He states that today the Home Health Nurse came and noticed that the wound had increased in size 2-3 times. According to the patient she then called the Wound Management Physician Dr. Nils Pyle and was instructed to have the patient come to the ED.  Patient reports that the wound was currently managed by applying med honey.   Patient also reports that he has noticed some thick brownish odorous discharge coming from the wound and that his wound pain is increased. Patient denies fever or chills.  He denies nausea or vomiting.  He also reports that he has had some dysuria and that his urine has been cloudy in color.  He denies any abdominal pain.  He reports that he is currently on Doxycycline due to history of Agent Orange, but is currently not on any antibiotics for the wound.  Patient does have a history of DM and malnutrition.   The history is provided by the patient.    Past Medical History  Diagnosis Date  . CAD (coronary artery disease)     native vessel  . Diabetes mellitus type II   . Hyperlipidemia   . HTN (hypertension)   . Chronic renal insufficiency   . Carotid artery disease     nonobstructive  . Anemia   . Hypokalemia   . Malnutrition     protein-calorie  . PTSD (post-traumatic stress disorder)   . Cecal diverticulitis 2008    drained   Past Surgical History  Procedure  Laterality Date  . Spinda bifida surgery    . Left knee surgery    . Coronary artery bypass graft  1998?  Marland Kitchen Hemorrhoid surgery  1980s?   History reviewed. No pertinent family history. History  Substance Use Topics  . Smoking status: Former Smoker -- 2.00 packs/day    Types: Cigarettes    Start date: 11/20/1948    Quit date: 08/19/1980  . Smokeless tobacco: Former Systems developer  . Alcohol Use: No    Review of Systems  All other systems reviewed and are negative.    Allergies  Aspirin; Nitrofurantoin; and Sulfonamide derivatives  Home Medications   Current Outpatient Rx  Name  Route  Sig  Dispense  Refill  . Ascorbic Acid (VITAMIN C) 1000 MG tablet   Oral   Take 1,000 mg by mouth daily.           Marland Kitchen aspirin 81 MG tablet   Oral   Take 81 mg by mouth daily.           Marland Kitchen buPROPion (WELLBUTRIN XL) 300 MG 24 hr tablet   Oral   Take 300 mg by mouth every morning.           . Cholecalciferol (VITAMIN D-3) 5000 UNITS TABS   Oral   Take 1 tablet by mouth daily.           Marland Kitchen  Coenzyme Q10 (CO Q-10) 100 MG CAPS   Oral   Take 1 capsule by mouth 2 (two) times daily.          Marland Kitchen glipiZIDE (GLUCOTROL XL) 10 MG 24 hr tablet      TAKE 1 TABLET DAILY   90 tablet   0   . hydrocortisone (ANUSOL-HC) 25 MG suppository   Rectal   Place 1 suppository (25 mg total) rectally 2 (two) times daily.   12 suppository   0   . hydrocortisone (PROCTOCREAM-HC) 2.5 % rectal cream   Rectal   Place rectally 2 (two) times daily.   30 g   2   . Lutein 40 MG CAPS   Oral   Take 1 capsule by mouth daily.           . metoprolol tartrate (LOPRESSOR) 25 MG tablet      TAKE 1 TABLET TWICE A DAY   180 tablet   1   . nitroGLYCERIN (NITROSTAT) 0.4 MG SL tablet   Sublingual   Place 1 tablet (0.4 mg total) under the tongue every 5 (five) minutes as needed.   25 tablet   6   . NON FORMULARY      cran-actin 100mg  1 tablet daily         . nystatin (MYCOSTATIN/NYSTOP) 100000 UNIT/GM  POWD   Topical   Apply 100,000 g topically 3 (three) times daily.   15 g   3   . nystatin-triamcinolone ointment (MYCOLOG)   Topical   Apply topically 2 (two) times daily.   60 g   3   . Omega-3 Fatty Acids (FISH OIL) 1000 MG CAPS   Oral   Take 1 capsule by mouth 2 (two) times daily.           . polyethylene glycol powder (GLYCOLAX/MIRALAX) powder   Oral   Take 17 g by mouth 2 (two) times daily as needed.   3350 g   1   . valsartan (DIOVAN) 80 MG tablet      TAKE 1 TABLET DAILY   90 tablet   2   . VYTORIN 10-20 MG per tablet      TAKE 1 TABLET DAILY AS DIRECTED   90 tablet   1     Dispense as written.    BP 135/68  Pulse 79  Temp(Src) 98.1 F (36.7 C) (Oral)  Resp 18  SpO2 98% Physical Exam  Nursing note and vitals reviewed. Constitutional: He appears well-developed and well-nourished.  HENT:  Head: Normocephalic and atraumatic.  Mouth/Throat: Oropharynx is clear and moist.  Neck: Normal range of motion. Neck supple.  Cardiovascular: Normal rate, regular rhythm and normal heart sounds.   Pulmonary/Chest: Effort normal and breath sounds normal.  Abdominal: Soft. Bowel sounds are normal. He exhibits no distension and no mass. There is no tenderness. There is no rebound and no guarding.  Genitourinary: Testes normal. Right testis shows no mass and no tenderness. Left testis shows no mass and no tenderness.  Neurological: He is alert.  Skin:  Large macerated ulcerated area of the buttocks bilaterally with small amount of central eschar.  Wound with surrounding erythema and warmth.  Small amount of thin yellowish colored discharge noticed on dressing covering the wound.  Psychiatric: He has a normal mood and affect.    ED Course  Procedures (including critical care time) Labs Review Labs Reviewed  CBC WITH DIFFERENTIAL  BASIC METABOLIC PANEL  URINALYSIS, ROUTINE W REFLEX MICROSCOPIC  Imaging Review No results found.  EKG Interpretation   None        MDM  No diagnosis found. Patient with a history of DM and Spina Bifida presents today with a wound to his buttocks.  Wound has been present for the past 6 months.  Wound has been managed by Wound Management and Home Health.  He reports that his home health nurse noted today that the wound had tripled in size when compared to last week.  He also reports that the pain of the wound has increased and he has noticed more discharge over the past week.   Patient with surrounding erythema and warmth surrounding the wound consistent with Cellulitis.  Patient with an elevated WBC. UA also showing UTI.  Patient admitted for IV antibiotics to treat both UTI and Cellulitis in addition to wound management.    Hyman Bible, PA-C 01/19/13 1715

## 2013-01-20 ENCOUNTER — Encounter (HOSPITAL_COMMUNITY): Payer: Self-pay | Admitting: General Practice

## 2013-01-20 DIAGNOSIS — L0291 Cutaneous abscess, unspecified: Secondary | ICD-10-CM

## 2013-01-20 DIAGNOSIS — E119 Type 2 diabetes mellitus without complications: Secondary | ICD-10-CM

## 2013-01-20 DIAGNOSIS — D649 Anemia, unspecified: Secondary | ICD-10-CM | POA: Diagnosis not present

## 2013-01-20 LAB — BASIC METABOLIC PANEL
CO2: 28 mEq/L (ref 19–32)
Calcium: 8.3 mg/dL — ABNORMAL LOW (ref 8.4–10.5)
Chloride: 102 mEq/L (ref 96–112)
Creatinine, Ser: 1.15 mg/dL (ref 0.50–1.35)
GFR calc Af Amer: 67 mL/min — ABNORMAL LOW (ref >90)

## 2013-01-20 LAB — CBC
HCT: 32.7 % — ABNORMAL LOW (ref 39.0–52.0)
MCH: 30.3 pg (ref 26.0–34.0)
Platelets: 257 10*3/uL (ref 150–400)
RDW: 13.5 % (ref 11.5–15.5)
WBC: 11.2 10*3/uL — ABNORMAL HIGH (ref 4.0–10.5)

## 2013-01-20 LAB — HEMOGLOBIN A1C
Hgb A1c MFr Bld: 7.9 % — ABNORMAL HIGH (ref <5.7)
Mean Plasma Glucose: 180 mg/dL — ABNORMAL HIGH (ref <117)

## 2013-01-20 LAB — TSH: TSH: 1.869 u[IU]/mL (ref 0.350–4.500)

## 2013-01-20 NOTE — Progress Notes (Signed)
[facility] PROGRESS NOTE  WRIGLEY LEMA [id number] DOB: Nov 02, 1930 DOA: 01/19/2013 PCP: Redge Gainer, MD   HPI: [name] is a 79 y.o. male with past medical history of diabetes, hypertension and history of spina bifida. Patient given to the hospital because of worsening of bilateral gluteal cellulitis. Patient has bilateral gluteal cellulitis for the past 6 months, he's been following with wound center in [place], [place]. About 10 days ago he tried Medihoney gel over his wounds, his home health nurse checked on him today and the wound about tripled since last week. She called his doctor in the wound center and they asked him to bring into the ER.  In the ED has bilateral gluteal cellulitis with purulent discharge, his UA also is consistent with UTI and has WBC count of 14.1.  Assessment/Plan: Cellulitis of the gluteal region  -Bilateral cellulitis involving the gluteal region, symmetrical kissing ulcers.  -Recent worsening, now with purulent discharge.  - CT scan of pelvis negative for  abscess formation.  -Wound and blood cultures obtained in the emergency department.  -Started on Zosyn and vancomycin.  UTI  -Urinalysis consistent with UTI.  -Started on Zosyn to cover for the cellulitis, this should be sufficient for the UTI.  Type 2 diabetes mellitus  -On glipizide, hold home medications.  -Carbohydrate modified diet, check hemoglobin A1c.  CBG (last 3)   Recent Labs  01/19/13 2359 01/20/13 0649 01/20/13 1138  GLUCAP 113* 75 78     Hypertension  -Continue home medications.  Spina bifida  -Obviously when he was younger was not affecting his mobility.   Code Status: Full code  Family Communication: Plan discussed with the patient  Disposition Plan:pending , possibly home when stable.     Consultants:  none  Procedures:  none  Antibiotics:  Vancomycin 11/26  Zosyn 11/26  HPI/Subjective: Pain better, requesting wheelchair for ambulation.    Objective: Filed Vitals:   01/20/13 1140  BP: 120/60  Pulse:   Temp:   Resp:     Intake/Output Summary (Last 24 hours) at 01/20/13 1204 Last data filed at 01/19/13 1846  Gross per 24 hour  Intake     70 ml  Output      0 ml  Net     70 ml   Filed Weights   01/19/13 1700  Weight: 111.131 kg (245 lb)    Exam:   General:  Alert afebrile comfortable  Cardiovascular: s1s2  Respiratory: ctab  Abdomen: soft NT ND BS+  Musculoskeletal: gluteal cellulitis with bandages .   Data Reviewed: Basic Metabolic Panel:  Recent Labs Lab 01/19/13 1455 01/20/13 0350  NA 138 137  K 5.0 4.2  CL 103 102  CO2 28 28  GLUCOSE 212* 72  BUN 22 18  CREATININE 1.17 1.15  CALCIUM 9.0 8.3*   Liver Function Tests: No results found for this basename: AST, ALT, ALKPHOS, BILITOT, PROT, ALBUMIN,  in the last 168 hours No results found for this basename: LIPASE, AMYLASE,  in the last 168 hours No results found for this basename: AMMONIA,  in the last 168 hours CBC:  Recent Labs Lab 01/19/13 1455 01/20/13 0350  WBC 14.1* 11.2*  NEUTROABS 9.8*  --   HGB 11.1* 10.8*  HCT 33.1* 32.7*  MCV 91.2 91.6  PLT 246 257   Cardiac Enzymes: No results found for this basename: CKTOTAL, CKMB, CKMBINDEX, TROPONINI,  in the last 168 hours BNP (last 3 results) No results found for this basename: PROBNP,  in the last 8760 hours CBG:  Recent Labs Lab 01/19/13 2359 01/20/13 0649 01/20/13 1138  GLUCAP 113* 75 78    Recent Results (from the past 240 hour(s))  WOUND CULTURE     Status: None   Collection Time    01/19/13  5:44 PM      Result Value Range Status   Specimen Description WOUND BUTTOCKS   Final   Special Requests Immunocompromised   Final   Gram Stain     Final   Value: ABUNDANT WBC PRESENT,BOTH PMN AND MONONUCLEAR     NO SQUAMOUS EPITHELIAL CELLS SEEN     MODERATE GRAM POSITIVE COCCI     IN PAIRS RARE YEAST     Performed at [facility]   Culture     Final    Value: Culture reincubated for better growth     Performed at [facility]   Report Status PENDING   Incomplete     Studies: Ct Pelvis W Contrast  01/19/2013   CLINICAL DATA:  Worsening bilateral gluteal cellulitis. Patient is head cellulitis for the past 6 months. Purulent discharge. Urinary analysis consistent with urinary tract infection. White cell count 14.1.  EXAM: CT PELVIS WITH CONTRAST  TECHNIQUE: Multidetector CT imaging of the pelvis was performed using the standard protocol following the bolus administration of intravenous contrast.  CONTRAST:  156mL OMNIPAQUE IOHEXOL 300 MG/ML  SOLN  COMPARISON:  12/21/2006  FINDINGS: There is diffuse and prominent fatty atrophy of the gluteal muscles bilaterally. Diffuse atrophy of the iliopsoas muscles bilaterally, greater on the right side. Minimal infiltration in the subcutaneous fat over the gluteal and perineal regions with suggestion of mild skin thickening. Changes likely represent cellulitis. No soft tissue fluid collections or gas collections are demonstrated to suggest abscess. Calcification of the prostate gland. Diffuse thickening of the bladder wall consistent with cystitis. No free or loculated pelvic fluid collections. No significant pelvic lymphadenopathy. Visualized large and small bowel loops are not distended. No destructive bone lesions are appreciated. Degenerative changes in the visualized lower lumbar spine.  IMPRESSION: No discrete soft tissue abscess is demonstrated. Minimal infiltration in the subcutaneous fat consistent with cellulitis. Diffuse fatty atrophy of the gluteal and iliopsoas muscles.   Electronically Signed   By: Lucienne Capers M.D.   On: 01/19/2013 22:13    Scheduled Meds: . aspirin EC  81 mg Oral Daily  . buPROPion  300 mg Oral Daily  . heparin  5,000 Units Subcutaneous Q8H  . insulin aspart  0-9 Units Subcutaneous TID WC  . irbesartan  75 mg Oral Daily  . metoprolol tartrate  25 mg Oral BID  .  piperacillin-tazobactam (ZOSYN)  IV  3.375 g Intravenous Q8H  . vancomycin  1,500 mg Intravenous Q24H   Continuous Infusions: . sodium chloride 75 mL/hr at 01/19/13 1830    Principal Problem:   Abscess or cellulitis of gluteal region Active Problems:   Type 2 diabetes mellitus with diabetic nephropathy   HYPERTENSION, UNSPECIFIED   Spina bifida   Cellulitis   UTI (lower urinary tract infection)    Time spent: 25 minutes    [facility] Pager [phone] 7PM-7AM, please contact night-coverage at [email or web address], password [facility] 01/20/2013, 12:04 PM  LOS: 1 day

## 2013-01-21 DIAGNOSIS — N189 Chronic kidney disease, unspecified: Secondary | ICD-10-CM | POA: Diagnosis not present

## 2013-01-21 DIAGNOSIS — L0291 Cutaneous abscess, unspecified: Secondary | ICD-10-CM | POA: Diagnosis not present

## 2013-01-21 DIAGNOSIS — E119 Type 2 diabetes mellitus without complications: Secondary | ICD-10-CM | POA: Diagnosis not present

## 2013-01-21 LAB — GLUCOSE, CAPILLARY
Glucose-Capillary: 65 mg/dL — ABNORMAL LOW (ref 70–99)
Glucose-Capillary: 174 mg/dL — ABNORMAL HIGH (ref 70–99)
Glucose-Capillary: 167 mg/dL — ABNORMAL HIGH (ref 70–99)
Glucose-Capillary: 213 mg/dL — ABNORMAL HIGH (ref 70–99)

## 2013-01-21 LAB — CBC
HCT: 32.1 % — ABNORMAL LOW (ref 39.0–52.0)
Hemoglobin: 10.8 g/dL — ABNORMAL LOW (ref 13.0–17.0)
MCHC: 33.6 g/dL (ref 30.0–36.0)
MCV: 90.4 fL (ref 78.0–100.0)
WBC: 10.4 10*3/uL (ref 4.0–10.5)

## 2013-01-21 MED ORDER — COLLAGENASE 250 UNIT/GM EX OINT
TOPICAL_OINTMENT | Freq: Every day | CUTANEOUS | Status: DC
  Administered 2013-01-21: 1 via TOPICAL
  Administered 2013-01-22 – 2013-01-23 (×2): via TOPICAL
  Administered 2013-01-24: 1 via TOPICAL
  Filled 2013-01-21 (×2): qty 30

## 2013-01-21 NOTE — Progress Notes (Signed)
Physical Therapy Wound Treatment Patient Details  Name: [name] MRN: [id number] Date of Birth: 1930/10/13  Today's Date: 01/21/2013 Time: 1130-1203 Time Calculation (min): 33 min  Subjective  Subjective: patient reports his bottom hurt when moving sit to stand Patient and Family Stated Goals: get this healed up Date of Onset: 01/11/13 Prior Treatments: medihoney  Pain Score:    Wound Assessment  Wound 01/19/13 Other (Comment) Buttocks Right;Left;Medial both buttocks are red with purulent drainage, in the center of the buttocks there are nickel sized area of eshcar; 01/21/13 Stage III PU updated by [place] nurse (Active)  Site / Wound Assessment Red;Yellow;Black 01/21/2013  1:07 PM  % Wound base Red or Granulating 50% 01/21/2013  1:07 PM  % Wound base Yellow 40% 01/21/2013  1:07 PM  % Wound base Black 10% 01/21/2013  1:07 PM  % Wound base Other (Comment) 0% 01/21/2013  1:07 PM  Peri-wound Assessment Erythema (blanchable) 01/21/2013  1:07 PM  Wound Length (cm) 10 cm 01/21/2013  1:07 PM  Wound Width (cm) 7 cm 01/21/2013  1:07 PM  Wound Depth (cm) 0 cm 01/21/2013  1:07 PM  Margins Unattacted edges (unapproximated) 01/21/2013  1:07 PM  Closure None 01/21/2013  1:07 PM  Drainage Amount Minimal 01/21/2013  1:07 PM  Drainage Description Serosanguineous 01/21/2013  1:07 PM  Treatment Hydrotherapy (Ultrasonic mist);Debridement (Selective) 01/21/2013  1:07 PM  Dressing Type Silver hydrofiber;ABD;Tape dressing 01/21/2013  1:07 PM  Dressing Changed Changed 01/21/2013  1:07 PM  Dressing Status Clean;Dry;Intact 01/21/2013  1:07 PM   Hydrotherapy Ultrasonic mist  - wound location: sacrum Ultrasonic mist at 35KHz (+/-3KHz) at ___ percent: 100 % Ultrasonic mist therapy minutes: 8 min Selective Debridement Selective Debridement - Location: sacrum Selective Debridement - Tools Used: Forceps;Scissors Selective Debridement - Tissue Removed: yellow eschar   Wound Assessment and Plan  Wound  Therapy - Assess/Plan/Recommendations Wound Therapy - Clinical Statement: patient presents with open wound on sacrum, spanning both buttocks.  Wound with small areas of eschar toward edges of wound and in center of wound.  Patient will benefit from  hydrotherapy to cleanse wound and promote healing.   Wound Therapy - Functional Problem List: open wound; painful when sitting Factors Delaying/Impairing Wound Healing: Diabetes Mellitus Hydrotherapy Plan: Debridement;Dressing change;Patient/family education;Ultrasonic wound therapy @35  KHz (+/- 3 KHz) Wound Therapy - Frequency: 6X / week Wound Therapy - Follow Up Recommendations: Home health RN  Wound Therapy Goals- Improve the function of patient's integumentary system by progressing the wound(s) through the phases of wound healing (inflammation - proliferation - remodeling) by: Decrease Necrotic Tissue to: 10 Decrease Necrotic Tissue - Progress: Goal set today Increase Granulation Tissue to: 90 Increase Granulation Tissue - Progress: Goal set today Improve Drainage Characteristics: Min Improve Drainage Characteristics - Progress: Goal set today Goals/treatment plan/discharge plan were made with and agreed upon by patient/family: Yes Time For Goal Achievement: 2 weeks Wound Therapy - Potential for Goals: Good  Goals will be updated until maximal potential achieved or discharge criteria met.  Discharge criteria: when goals achieved, discharge from hospital, MD decision/surgical intervention, no progress towards goals, refusal/missing three consecutive treatments without notification or medical reason.  GP     Shanna Cisco, [place] 01/21/2013, 1:14 PM

## 2013-01-21 NOTE — Progress Notes (Signed)
[facility] PROGRESS NOTE  [name] [id number] DOB: 07-10-1930 DOA: 01/19/2013 PCP: Redge Gainer, MD   HPI: [name] is a 79 y.o. male with past medical history of diabetes, hypertension and history of spina bifida. Patient given to the hospital because of worsening of bilateral gluteal cellulitis. Patient has bilateral gluteal cellulitis for the past 6 months, he's been following with wound center in [place], [place]. About 10 days ago he tried Medihoney gel over his wounds, his home health nurse checked on him today and the wound about tripled since last week. She called his doctor in the wound center and they asked him to bring into the ER.  In the ED has bilateral gluteal cellulitis with purulent discharge, his UA also is consistent with UTI and has WBC count of 14.1.  Assessment/Plan: Cellulitis of the gluteal region  -Bilateral cellulitis involving the gluteal region, symmetrical kissing ulcers.  -Recent worsening, now with purulent discharge.  - CT scan of pelvis negative for  abscess formation.  -Wound and blood cultures obtained in the emergency department.  -Started on Zosyn and vancomycin.  UTI  -Urinalysis consistent with UTI.  -Started on Zosyn to cover for the cellulitis, this should be sufficient for the UTI.  Type 2 diabetes mellitus  -On glipizide, hold home medications.  -Carbohydrate modified diet, check hemoglobin A1c.  CBG (last 3)   Recent Labs  01/21/13 1111 01/21/13 1300 01/21/13 1606  GLUCAP 174* 167* 213*     Hypertension  -Continue home medications.  Spina bifida  -Obviously when he was younger was not affecting his mobility.   Code Status: Full code  Family Communication: Plan discussed with the patient  Disposition Plan:pending , possibly home when stable.     Consultants:  none  Procedures:  none  Antibiotics:  Vancomycin 11/26  Zosyn 11/26  HPI/Subjective: Pain better, requesting wheelchair for ambulation.    Objective: Filed Vitals:   01/21/13 1430  BP: 99/52  Pulse: 74  Temp: 98.2 F (36.8 C)  Resp: 18    Intake/Output Summary (Last 24 hours) at 01/21/13 1757 Last data filed at 01/21/13 1130  Gross per 24 hour  Intake   4915 ml  Output      0 ml  Net   4915 ml   Filed Weights   01/19/13 1700  Weight: 111.131 kg (245 lb)    Exam:   General:  Alert afebrile comfortable  Cardiovascular: s1s2  Respiratory: ctab  Abdomen: soft NT ND BS+  Musculoskeletal: gluteal cellulitis with bandages .   Data Reviewed: Basic Metabolic Panel:  Recent Labs Lab 01/19/13 1455 01/20/13 0350  NA 138 137  K 5.0 4.2  CL 103 102  CO2 28 28  GLUCOSE 212* 72  BUN 22 18  CREATININE 1.17 1.15  CALCIUM 9.0 8.3*   Liver Function Tests: No results found for this basename: AST, ALT, ALKPHOS, BILITOT, PROT, ALBUMIN,  in the last 168 hours No results found for this basename: LIPASE, AMYLASE,  in the last 168 hours No results found for this basename: AMMONIA,  in the last 168 hours CBC:  Recent Labs Lab 01/19/13 1455 01/20/13 0350 01/21/13 0416  WBC 14.1* 11.2* 10.4  NEUTROABS 9.8*  --   --   HGB 11.1* 10.8* 10.8*  HCT 33.1* 32.7* 32.1*  MCV 91.2 91.6 90.4  PLT 246 257 270   Cardiac Enzymes: No results found for this basename: CKTOTAL, CKMB, CKMBINDEX, TROPONINI,  in the last 168 hours BNP (last 3  results) No results found for this basename: PROBNP,  in the last 8760 hours CBG:  Recent Labs Lab 01/20/13 2128 01/21/13 0637 01/21/13 1111 01/21/13 1300 01/21/13 1606  GLUCAP 103* 65* 174* 167* 213*    Recent Results (from the past 240 hour(s))  CULTURE, BLOOD (ROUTINE X 2)     Status: None   Collection Time    01/19/13  5:18 PM      Result Value Range Status   Specimen Description BLOOD ARM LEFT   Final   Special Requests BOTTLES DRAWN AEROBIC AND ANAEROBIC 5CC   Final   Culture  Setup Time     Final   Value: 01/20/2013 02:57     Performed at [facility]    Culture     Final   Value:        BLOOD CULTURE RECEIVED NO GROWTH TO DATE CULTURE WILL BE HELD FOR 5 DAYS BEFORE ISSUING A FINAL NEGATIVE REPORT     Performed at [facility]   Report Status PENDING   Incomplete  WOUND CULTURE     Status: None   Collection Time    01/19/13  5:44 PM      Result Value Range Status   Specimen Description WOUND BUTTOCKS   Final   Special Requests Immunocompromised   Final   Gram Stain     Final   Value: ABUNDANT WBC PRESENT,BOTH PMN AND MONONUCLEAR     NO SQUAMOUS EPITHELIAL CELLS SEEN     MODERATE GRAM POSITIVE COCCI     IN PAIRS RARE YEAST     Performed at [facility]   Culture     Final   Value: MULTIPLE ORGANISMS PRESENT, NONE PREDOMINANT     Performed at [facility]   Report Status PENDING   Incomplete     Studies: Ct Pelvis W Contrast  01/19/2013   CLINICAL DATA:  Worsening bilateral gluteal cellulitis. Patient is head cellulitis for the past 6 months. Purulent discharge. Urinary analysis consistent with urinary tract infection. White cell count 14.1.  EXAM: CT PELVIS WITH CONTRAST  TECHNIQUE: Multidetector CT imaging of the pelvis was performed using the standard protocol following the bolus administration of intravenous contrast.  CONTRAST:  117mL OMNIPAQUE IOHEXOL 300 MG/ML  SOLN  COMPARISON:  12/21/2006  FINDINGS: There is diffuse and prominent fatty atrophy of the gluteal muscles bilaterally. Diffuse atrophy of the iliopsoas muscles bilaterally, greater on the right side. Minimal infiltration in the subcutaneous fat over the gluteal and perineal regions with suggestion of mild skin thickening. Changes likely represent cellulitis. No soft tissue fluid collections or gas collections are demonstrated to suggest abscess. Calcification of the prostate gland. Diffuse thickening of the bladder wall consistent with cystitis. No free or loculated pelvic fluid collections. No significant pelvic lymphadenopathy. Visualized large and  small bowel loops are not distended. No destructive bone lesions are appreciated. Degenerative changes in the visualized lower lumbar spine.  IMPRESSION: No discrete soft tissue abscess is demonstrated. Minimal infiltration in the subcutaneous fat consistent with cellulitis. Diffuse fatty atrophy of the gluteal and iliopsoas muscles.   Electronically Signed   By: Lucienne Capers M.D.   On: 01/19/2013 22:13    Scheduled Meds: . aspirin EC  81 mg Oral Daily  . buPROPion  300 mg Oral Daily  . collagenase   Topical Daily  . heparin  5,000 Units Subcutaneous Q8H  . insulin aspart  0-9 Units Subcutaneous TID WC  . irbesartan  75  mg Oral Daily  . metoprolol tartrate  25 mg Oral BID  . piperacillin-tazobactam (ZOSYN)  IV  3.375 g Intravenous Q8H  . vancomycin  1,500 mg Intravenous Q24H   Continuous Infusions: . sodium chloride 1,000 mL (01/21/13 0056)    Principal Problem:   Abscess or cellulitis of gluteal region Active Problems:   Type 2 diabetes mellitus with diabetic nephropathy   HYPERTENSION, UNSPECIFIED   Spina bifida   Cellulitis   UTI (lower urinary tract infection)    Time spent: 25 minutes    [place] [facility] Pager [phone] 7PM-7AM, please contact night-coverage at [email or web address], password [facility] 01/21/2013, 5:57 PM  LOS: 2 days

## 2013-01-21 NOTE — Evaluation (Signed)
Physical Therapy Evaluation Patient Details Name: [name] MRN: [id number] DOB: September 18, 1930 Today's Date: 01/21/2013 Time: 0935-1005 PT Time Calculation (min): 30 min  PT Assessment / Plan / Recommendation History of Present Illness  Pt is an 79 y/o male admitted with bilateral gluteal cellulitis with ulcers. Significant PMH includes DM with diabetic neuropathy effecting bilateral feet, HTN, spina bifida with sensation changes down L ankle to foot.  Clinical Impression  This patient presents with acute pain and decreased functional independence following the above mentioned procedure. At the time of PT eval, pt required min assist for functional mobility and transfers. Pt feels he is close to his baseline but has declined in regards to strength and tolerance for functional activity as a result of this recent exacerbation of his wounds. This patient is appropriate for skilled PT interventions to address functional limitations, improve safety and independence with functional mobility, and return to PLOF.    PT Assessment  Patient needs continued PT services    Follow Up Recommendations  Home health PT    Does the patient have the potential to tolerate intense rehabilitation      Barriers to Discharge        Equipment Recommendations  None recommended by PT    Recommendations for Other Services OT consult   Frequency Min 3X/week    Precautions / Restrictions Precautions Precautions: Fall Restrictions Weight Bearing Restrictions: No   Pertinent Vitals/Pain Pt reports minimal pain throughout session, stating he feels some pressure on his wounds while sitting in the chair. Pillow placed under pt's bottom until cushion is received.      Mobility  Bed Mobility Bed Mobility: Supine to Sit;Sitting - Scoot to Edge of Bed Supine to Sit: 4: Min assist;HOB elevated;With rails Sitting - Scoot to Edge of Bed: 4: Min guard Details for Bed Mobility Assistance: Assist for trunk to  come to full sitting, and bed pad used to help slide pt to EOB. Transfers Transfers: Sit to Stand;Stand to Sit Sit to Stand: 4: Min assist;From bed;With upper extremity assist Stand to Sit: 4: Min assist;To chair/3-in-1;With upper extremity assist Details for Transfer Assistance: VC's for hand placement and safety awareness prior to initiating transfers.  Ambulation/Gait Ambulation/Gait Assistance: 4: Min guard Ambulation Distance (Feet): 80 Feet Assistive device: Rolling walker Ambulation/Gait Assistance Details: VC's for walker position closer to pt's body, and for improved posture. Gait Pattern: Step-through pattern;Decreased stride length;Trunk flexed Gait velocity: Decreased General Gait Details: Fatigues quickly and became SOB towards end of ambulation. Recovered within 2 minutes upon seated rest break in room.  Stairs: No    Exercises     PT Diagnosis: Difficulty walking  PT Problem List: Decreased strength;Decreased range of motion;Decreased activity tolerance;Decreased balance;Decreased mobility;Decreased knowledge of use of DME;Decreased safety awareness;Decreased skin integrity PT Treatment Interventions: DME instruction;Gait training;Stair training;Functional mobility training;Therapeutic activities;Therapeutic exercise;Neuromuscular re-education;Patient/family education     PT Goals(Current goals can be found in the care plan section) Acute Rehab PT Goals Patient Stated Goal: To return home with wife PT Goal Formulation: With patient/family Time For Goal Achievement: 01/28/13 Potential to Achieve Goals: Good  Visit Information  Last PT Received On: 01/21/13 Assistance Needed: +1 History of Present Illness: Pt is an 79 y/o male admitted with bilateral gluteal cellulitis with ulcers. Significant PMH includes DM with diabetic neuropathy on bilateral feet, HTN, spina bifida with sensation changes down L ankle to foot.       Prior Functioning  Home  Living Family/patient expects to be discharged to::  Private residence Living Arrangements: Spouse/significant other Available Help at Discharge: Family;Available 24 hours/day Type of Home: House Home Access: Stairs to enter [employer] of Steps: 1 Entrance Stairs-Rails:  (Door jam) Home Layout: One level Home Equipment: Walker - 4 wheels;Walker - 2 wheels;Shower seat - built in;Hand held shower head Prior Function Level of Independence: Needs assistance Gait / Transfers Assistance Needed: Independent with walker ADL's / Homemaking Assistance Needed: Wife assists with lower body bathing/dressing Communication Communication: No difficulties Dominant Hand: Right    Cognition  Cognition Arousal/Alertness: Awake/alert Behavior During Therapy: WFL for tasks assessed/performed Overall Cognitive Status: Within Functional Limits for tasks assessed    Extremity/Trunk Assessment Upper Extremity Assessment Upper Extremity Assessment: Overall WFL for tasks assessed Lower Extremity Assessment Lower Extremity Assessment: Generalized weakness Cervical / Trunk Assessment Cervical / Trunk Assessment: Normal   Balance    End of Session PT - End of Session Equipment Utilized During Treatment: Gait belt Activity Tolerance: Patient limited by fatigue Patient left: in chair;with call bell/phone within reach;with family/visitor present Nurse Communication: Mobility status  GP     Jolyn Lent 01/21/2013, 12:21 PM  Jolyn Lent, [place], DPT [phone]

## 2013-01-21 NOTE — Consult Note (Signed)
WOC wound consult note Reason for Consult: evaluation of buttock wounds, reports "yeast infection" started in June and treated with topical antifungals.  Started visits with the [facility] wound care center and [facility] for treatment, he was started on manuka honey dressings 10 days ago, however the wounds appeared worse to his wife and when they reported this to the wound care center they were instructed to come into the hospital for evaluation.  He reports history of some type of ulcerations here in the past.  He spends a great deal of time in his recliner, he is not incontinent of bowel, but does require self cath for bladder.  He reports he wears an incontinent brief for urine leakage.  I feel these are pressure ulcers related to the time sitting and the use of incontinent briefs.  He does continue to have intense erythema and satellite lesions consistent with candida, may benefit from PO antifungal tx. Wound type: Stage III pressure ulcer Pressure Ulcer POA: Yes Measurement:12cm x 11cm x 0.2 cm -wound spans the buttocks into the gluteal crease Wound bed: 90% pink with 10% soft black/yellow circular eschar at the distal portion of the wounds, consistent with sitting surface of the wound Drainage (amount, consistency, odor) moderate to heavy drainage, non foul, no odor Periwound: blanchable erythema with satellite lesions  Dressing procedure/placement/frequency: Silver hydrofiber (Aquacel AG) over the wound, add Santyl to the necrotic areas.  Cover with dry dressing. PT for hydrotherapy for debridement of the necrotic tissue.  Air mattress overlay and chair pressure redistribution pad for use if up in chair and to be taken home for use in his recliner. Maximize nutrition  for wound healing, add MVI and protein if possible. Continue follow up with wound care center at discharge.    WOC will follow along with you for wound care changes as needed.  Cheryll Keisler Hickory RN,CWOCN [id number]

## 2013-01-22 DIAGNOSIS — L0291 Cutaneous abscess, unspecified: Secondary | ICD-10-CM | POA: Diagnosis not present

## 2013-01-22 DIAGNOSIS — E119 Type 2 diabetes mellitus without complications: Secondary | ICD-10-CM | POA: Diagnosis not present

## 2013-01-22 DIAGNOSIS — I10 Essential (primary) hypertension: Secondary | ICD-10-CM | POA: Diagnosis not present

## 2013-01-22 LAB — WOUND CULTURE

## 2013-01-22 LAB — GLUCOSE, CAPILLARY
Glucose-Capillary: 98 mg/dL (ref 70–99)
Glucose-Capillary: 156 mg/dL — ABNORMAL HIGH (ref 70–99)
Glucose-Capillary: 188 mg/dL — ABNORMAL HIGH (ref 70–99)

## 2013-01-22 NOTE — Progress Notes (Signed)
[facility] PROGRESS NOTE  [name] [id number] DOB: 1931-02-15 DOA: 01/19/2013 PCP: Redge Gainer, MD   HPI: [name] is a 79 y.o. male with past medical history of diabetes, hypertension and history of spina bifida. Patient given to the hospital because of worsening of bilateral gluteal cellulitis. Patient has bilateral gluteal cellulitis for the past 6 months, he's been following with wound center in [place], [place]. About 10 days ago he tried Medihoney gel over his wounds, his home health nurse checked on him today and the wound about tripled since last week. She called his doctor in the wound center and they asked him to bring into the ER.  In the ED has bilateral gluteal cellulitis with purulent discharge, his UA also is consistent with UTI and has WBC count of 14.1.  Assessment/Plan: Cellulitis of the gluteal region  -Bilateral cellulitis involving the gluteal region, symmetrical kissing ulcers.  -Recent worsening, now with purulent discharge. Cultures fromt he wound were negative. Blood cultures are negative so far.  - CT scan of pelvis negative for  abscess formation.  -Wound and blood cultures obtained in the emergency department.  -he completed 4 days of IV antibiotics UTI  -Urinalysis consistent with UTI.  -Started on Zosyn to cover for the cellulitis, this should be sufficient for the UTI.  Type 2 diabetes mellitus  -On glipizide, hold home medications.  -Carbohydrate modified diet, check hemoglobin A1c.  CBG (last 3)   Recent Labs  01/21/13 2201 01/22/13 0645 01/22/13 1105  GLUCAP 129* 98 137*     Hypertension  -Continue home medications.  Spina bifida  -Obviously when he was younger was not affecting his mobility.   Code Status: Full code  Family Communication: Plan discussed with the patient  Disposition Plan:pending , home health PT.     Consultants:  none  Procedures:  none  Antibiotics:  Vancomycin 11/26  Zosyn  11/26  HPI/Subjective: Pain better, requesting wheelchair for ambulation. NO NEW COMPLAINTS.   Objective: Filed Vitals:   01/22/13 1429  BP: 134/43  Pulse: 74  Temp: 98.1 F (36.7 C)  Resp: 16    Intake/Output Summary (Last 24 hours) at 01/22/13 1543 Last data filed at 01/22/13 1145  Gross per 24 hour  Intake   2790 ml  Output   1000 ml  Net   1790 ml   Filed Weights   01/19/13 1700  Weight: 111.131 kg (245 lb)    Exam:   General:  Alert afebrile comfortable  Cardiovascular: s1s2  Respiratory: ctab  Abdomen: soft NT ND BS+  Musculoskeletal: gluteal cellulitis with bandages .   Data Reviewed: Basic Metabolic Panel:  Recent Labs Lab 01/19/13 1455 01/20/13 0350  NA 138 137  K 5.0 4.2  CL 103 102  CO2 28 28  GLUCOSE 212* 72  BUN 22 18  CREATININE 1.17 1.15  CALCIUM 9.0 8.3*   Liver Function Tests: No results found for this basename: AST, ALT, ALKPHOS, BILITOT, PROT, ALBUMIN,  in the last 168 hours No results found for this basename: LIPASE, AMYLASE,  in the last 168 hours No results found for this basename: AMMONIA,  in the last 168 hours CBC:  Recent Labs Lab 01/19/13 1455 01/20/13 0350 01/21/13 0416  WBC 14.1* 11.2* 10.4  NEUTROABS 9.8*  --   --   HGB 11.1* 10.8* 10.8*  HCT 33.1* 32.7* 32.1*  MCV 91.2 91.6 90.4  PLT 246 257 270   Cardiac Enzymes: No results found for this basename: CKTOTAL,  CKMB, CKMBINDEX, TROPONINI,  in the last 168 hours BNP (last 3 results) No results found for this basename: PROBNP,  in the last 8760 hours CBG:  Recent Labs Lab 01/21/13 1300 01/21/13 1606 01/21/13 2201 01/22/13 0645 01/22/13 1105  GLUCAP 167* 213* 129* 98 137*    Recent Results (from the past 240 hour(s))  CULTURE, BLOOD (ROUTINE X 2)     Status: None   Collection Time    01/19/13  5:18 PM      Result Value Range Status   Specimen Description BLOOD ARM LEFT   Final   Special Requests BOTTLES DRAWN AEROBIC AND ANAEROBIC 5CC   Final    Culture  Setup Time     Final   Value: 01/20/2013 02:57     Performed at [facility]   Culture     Final   Value:        BLOOD CULTURE RECEIVED NO GROWTH TO DATE CULTURE WILL BE HELD FOR 5 DAYS BEFORE ISSUING A FINAL NEGATIVE REPORT     Performed at [facility]   Report Status PENDING   Incomplete  WOUND CULTURE     Status: None   Collection Time    01/19/13  5:44 PM      Result Value Range Status   Specimen Description WOUND BUTTOCKS   Final   Special Requests Immunocompromised   Final   Gram Stain     Final   Value: ABUNDANT WBC PRESENT,BOTH PMN AND MONONUCLEAR     NO SQUAMOUS EPITHELIAL CELLS SEEN     MODERATE GRAM POSITIVE COCCI     IN PAIRS RARE YEAST     Performed at [facility]   Culture     Final   Value: MULTIPLE ORGANISMS PRESENT, NONE PREDOMINANT     Note: NO STAPHYLOCOCCUS AUREUS ISOLATED NO GROUP A STREP (S.PYOGENES) ISOLATED     Performed at [facility]   Report Status 01/22/2013 FINAL   Final     Studies: No results found.  Scheduled Meds: . aspirin EC  81 mg Oral Daily  . buPROPion  300 mg Oral Daily  . collagenase   Topical Daily  . heparin  5,000 Units Subcutaneous Q8H  . insulin aspart  0-9 Units Subcutaneous TID WC  . irbesartan  75 mg Oral Daily  . metoprolol tartrate  25 mg Oral BID  . piperacillin-tazobactam (ZOSYN)  IV  3.375 g Intravenous Q8H  . vancomycin  1,500 mg Intravenous Q24H   Continuous Infusions:    Principal Problem:   Abscess or cellulitis of gluteal region Active Problems:   Type 2 diabetes mellitus with diabetic nephropathy   HYPERTENSION, UNSPECIFIED   Spina bifida   Cellulitis   UTI (lower urinary tract infection)    Time spent: 25 minutes    [facility] Pager [phone] 7PM-7AM, please contact night-coverage at [email or web address], password [facility] 01/22/2013, 3:43 PM  LOS: 3 days

## 2013-01-22 NOTE — Progress Notes (Signed)
ANTIBIOTIC CONSULT NOTE - FOLLOW-UP  Pharmacy Consult for vancomycin/zosyn Indication: Cellulitis  Allergies  Allergen Reactions  . Aspirin     In high doses  . Sulfonamide Derivatives     REACTION: pruitis patient cant remember its been so long    . Nitrofurantoin Rash    Patient Measurements: Height: 5\' 8"  (172.7 cm) Weight: 245 lb (111.131 kg) IBW/kg (Calculated) : 68.4Ht: 68in Wt per pt: 245lbs Adjusted Body Weight:   Vital Signs: Temp: 97.7 F (36.5 C) (11/29 0643) Temp src: Oral (11/29 0643) BP: 144/65 mmHg (11/29 0643) Pulse Rate: 71 (11/29 0643) Intake/Output from previous day: 11/28 0701 - 11/29 0700 In: 1080 [P.O.:1080] Out: 1000 [Urine:1000] Intake/Output from this shift: Total I/O In: 2430 [P.O.:480; I.V.:1950] Out: -   Labs:  Recent Labs  01/19/13 1455 01/20/13 0350 01/21/13 0416  WBC 14.1* 11.2* 10.4  HGB 11.1* 10.8* 10.8*  PLT 246 257 270  CREATININE 1.17 1.15  --    Estimated Creatinine Clearance: 62 ml/min (by C-G formula based on Cr of 1.15). No results found for this basename: VANCOTROUGH, Corlis Leak, VANCORANDOM, [place], [place], [place], [place], [place], [place], AMIKACINPEAK, AMIKACINTROU, AMIKACIN,  in the last 72 hours   Microbiology: Recent Results (from the past 720 hour(s))  CULTURE, BLOOD (ROUTINE X 2)     Status: None   Collection Time    01/19/13  5:18 PM      Result Value Range Status   Specimen Description BLOOD ARM LEFT   Final   Special Requests BOTTLES DRAWN AEROBIC AND ANAEROBIC 5CC   Final   Culture  Setup Time     Final   Value: 01/20/2013 02:57     Performed at [facility]   Culture     Final   Value:        BLOOD CULTURE RECEIVED NO GROWTH TO DATE CULTURE WILL BE HELD FOR 5 DAYS BEFORE ISSUING A FINAL NEGATIVE REPORT     Performed at [facility]   Report Status PENDING   Incomplete  WOUND CULTURE     Status: None   Collection Time    01/19/13  5:44 PM      Result Value Range  Status   Specimen Description WOUND BUTTOCKS   Final   Special Requests Immunocompromised   Final   Gram Stain     Final   Value: ABUNDANT WBC PRESENT,BOTH PMN AND MONONUCLEAR     NO SQUAMOUS EPITHELIAL CELLS SEEN     MODERATE GRAM POSITIVE COCCI     IN PAIRS RARE YEAST     Performed at [facility]   Culture     Final   Value: MULTIPLE ORGANISMS PRESENT, NONE PREDOMINANT     Note: NO STAPHYLOCOCCUS AUREUS ISOLATED NO GROUP A STREP (S.PYOGENES) ISOLATED     Performed at [facility]   Report Status 01/22/2013 FINAL   Final    Medical History: Past Medical History  Diagnosis Date  . CAD (coronary artery disease)     native vessel  . Hyperlipidemia   . HTN (hypertension)   . Carotid artery disease     nonobstructive  . Anemia   . Hypokalemia   . Malnutrition     protein-calorie  . PTSD (post-traumatic stress disorder)   . Cecal diverticulitis 2008    drained  . Cellulitis, gluteal     bilateral for the past 6 months/notes 01/20/2013  . Myocardial infarction     "silent; before OHS" (01/20/2013)  .  Agent orange exposure 1966 or 1971  . Exertional shortness of breath   . Diabetes mellitus type II   . Arthritis     "hands and back" (01/20/2013)  . Chronic lower back pain   . Anxiety   . Depression   . Chronic renal insufficiency   . Urinary incontinence     Medications:  Prescriptions prior to admission  Medication Sig Dispense Refill  . acetaminophen (TYLENOL) 500 MG tablet Take 500 mg by mouth every 6 (six) hours as needed for mild pain.      . Ascorbic Acid (VITAMIN C) 1000 MG tablet Take 1,000 mg by mouth daily.        Marland Kitchen aspirin 81 MG tablet Take 81 mg by mouth daily.        Marland Kitchen buPROPion (WELLBUTRIN XL) 300 MG 24 hr tablet Take 300 mg by mouth every morning.        . Cholecalciferol (VITAMIN D-3) 5000 UNITS TABS Take 1 tablet by mouth daily.        . Coenzyme Q10 (CO Q-10) 100 MG CAPS Take 1 capsule by mouth 2 (two) times daily.       Marland Kitchen  ezetimibe-simvastatin (VYTORIN) 10-20 MG per tablet Take 1 tablet by mouth 2 (two) times daily.      Marland Kitchen glipiZIDE (GLUCOTROL XL) 10 MG 24 hr tablet Take 10 mg by mouth daily.      . hydrocortisone (ANUSOL-HC) 25 MG suppository Place 1 suppository (25 mg total) rectally 2 (two) times daily.  12 suppository  0  . hydrocortisone (PROCTOCREAM-HC) 2.5 % rectal cream Place rectally 2 (two) times daily.  30 g  2  . metoprolol tartrate (LOPRESSOR) 25 MG tablet Take 25 mg by mouth 2 (two) times daily.      . Multiple Vitamins-Minerals (PRESERVISION AREDS 2 PO) Take 1 capsule by mouth 2 (two) times daily.      . nitroGLYCERIN (NITROSTAT) 0.4 MG SL tablet Place 1 tablet (0.4 mg total) under the tongue every 5 (five) minutes as needed.  25 tablet  6  . NON FORMULARY cran-actin 100mg  1 tablet daily      . nystatin (MYCOSTATIN/NYSTOP) 100000 UNIT/GM POWD Apply 100,000 g topically 3 (three) times daily.  15 g  3  . nystatin-triamcinolone ointment (MYCOLOG) Apply topically 2 (two) times daily.  60 g  3  . Omega-3 Fatty Acids (FISH OIL) 1000 MG CAPS Take 1 capsule by mouth 2 (two) times daily.        . polyethylene glycol powder (GLYCOLAX/MIRALAX) powder Take 17 g by mouth 2 (two) times daily as needed.  3350 g  1  . valsartan (DIOVAN) 80 MG tablet Take 80 mg by mouth daily.       Scheduled:  . aspirin EC  81 mg Oral Daily  . buPROPion  300 mg Oral Daily  . collagenase   Topical Daily  . heparin  5,000 Units Subcutaneous Q8H  . insulin aspart  0-9 Units Subcutaneous TID WC  . irbesartan  75 mg Oral Daily  . metoprolol tartrate  25 mg Oral BID  . piperacillin-tazobactam (ZOSYN)  IV  3.375 g Intravenous Q8H  . vancomycin  1,500 mg Intravenous Q24H   Assessment: 79 yo who was seen in the ED for what it looks like cellulitis. Patient is currently afeb, wbc trending down. Renal function has remained stable. Per MD will give one more day of IV antibiotics.   BCx> ngtd Wound Cx> multi organism- none predominant  (  no Grp A or Staph isolated)  Goal of Therapy:  Vancomycin trough level 10-15 mcg/ml  Plan:  Continue Vanc 1.5 IV q24h  Continue Zosyn 3.375 g IV q8h (4hr Infusion) F/U on Cultures, clinical progression, and renal function

## 2013-01-22 NOTE — Progress Notes (Signed)
Physical Therapy Wound Treatment Patient Details  Name: MASUD WILKINS MRN: [id number] Date of Birth: Feb 29, 1930  Today's Date: 01/22/2013 Time: 1011-1049 Time Calculation (min): 38 min  Subjective  Subjective: Pt asking how it looks.  Pain Score:  No c/o's. Declined pain meds.  Wound Assessment  Wound 01/19/13 Other (Comment) Buttocks Right;Left;Medial both buttocks are red with purulent drainage, in the center of the buttocks there are nickel sized area of eshcar; 01/21/13 Stage III PU updated by [place] nurse (Active)  Site / Wound Assessment Red;Yellow;Black 01/22/2013 11:09 AM  % Wound base Red or Granulating 50% 01/22/2013 11:09 AM  % Wound base Yellow 45% 01/22/2013 11:09 AM  % Wound base Black 5% 01/22/2013 11:09 AM  % Wound base Other (Comment) 0% 01/22/2013 11:09 AM  Peri-wound Assessment Erythema (blanchable) 01/22/2013 11:09 AM  Wound Length (cm) 10 cm 01/21/2013  1:07 PM  Wound Width (cm) 7 cm 01/21/2013  1:07 PM  Wound Depth (cm) 0 cm 01/21/2013  1:07 PM  Margins Unattacted edges (unapproximated) 01/22/2013 11:09 AM  Closure None 01/22/2013 11:09 AM  Drainage Amount Moderate 01/22/2013 11:09 AM  Drainage Description Serosanguineous 01/22/2013 11:09 AM  Treatment Debridement (Selective);Hydrotherapy (Ultrasonic mist) 01/22/2013 11:09 AM  Dressing Type Silver hydrofiber;ABD;Tape dressing;Other (Comment) 01/22/2013 11:09 AM  Dressing Changed Changed 01/22/2013 11:09 AM  Dressing Status Clean;Dry;Intact 01/22/2013 11:09 AM   Hydrotherapy Ultrasonic mist  - wound location: sacrum Ultrasonic mist at 35KHz (+/-3KHz) at ___ percent: 100 % Ultrasonic mist therapy minutes: 7 min Selective Debridement Selective Debridement - Location: sacrum Selective Debridement - Tools Used: Forceps;Scissors Selective Debridement - Tissue Removed: yellow slough   Wound Assessment and Plan  Wound Therapy - Assess/Plan/Recommendations Wound Therapy - Clinical Statement: Progressing with  loosening and removal of eschar/slough.  May switch to pulse lavage on Monday. Hydrotherapy Plan: Debridement;Dressing change;Patient/family education;Ultrasonic wound therapy @35  KHz (+/- 3 KHz);Pulsatile lavage with suction Wound Therapy - Frequency: 6X / week Wound Therapy - Follow Up Recommendations: Home health RN Wound Plan: See above.  Wound Therapy Goals- Improve the function of patient's integumentary system by progressing the wound(s) through the phases of wound healing (inflammation - proliferation - remodeling) by: Decrease Necrotic Tissue - Progress: Progressing toward goal Increase Granulation Tissue - Progress: Progressing toward goal Improve Drainage Characteristics - Progress: Progressing toward goal  Goals will be updated until maximal potential achieved or discharge criteria met.  Discharge criteria: when goals achieved, discharge from hospital, MD decision/surgical intervention, no progress towards goals, refusal/missing three consecutive treatments without notification or medical reason.  GP     Grisela Mesch 01/22/2013, 11:12 AM  [place]

## 2013-01-23 DIAGNOSIS — L0291 Cutaneous abscess, unspecified: Secondary | ICD-10-CM | POA: Diagnosis not present

## 2013-01-23 DIAGNOSIS — E119 Type 2 diabetes mellitus without complications: Secondary | ICD-10-CM | POA: Diagnosis not present

## 2013-01-23 LAB — GLUCOSE, CAPILLARY
Glucose-Capillary: 230 mg/dL — ABNORMAL HIGH (ref 70–99)
Glucose-Capillary: 182 mg/dL — ABNORMAL HIGH (ref 70–99)

## 2013-01-23 LAB — CBC
MCHC: 33.4 g/dL (ref 30.0–36.0)
MCV: 90.4 fL (ref 78.0–100.0)
Platelets: 282 10*3/uL (ref 150–400)
RDW: 13.8 % (ref 11.5–15.5)

## 2013-01-23 LAB — BASIC METABOLIC PANEL
BUN: 15 mg/dL (ref 6–23)
CO2: 26 mEq/L (ref 19–32)
GFR calc Af Amer: 60 mL/min — ABNORMAL LOW (ref >90)
GFR calc non Af Amer: 52 mL/min — ABNORMAL LOW (ref >90)
Potassium: 4.7 mEq/L (ref 3.5–5.1)
Sodium: 138 mEq/L (ref 135–145)

## 2013-01-23 MED ORDER — AMOXICILLIN-POT CLAVULANATE 875-125 MG PO TABS
1.0000 | ORAL_TABLET | Freq: Two times a day (BID) | ORAL | Status: DC
  Administered 2013-01-23 – 2013-01-24 (×3): 1 via ORAL
  Filled 2013-01-23 (×4): qty 1

## 2013-01-23 NOTE — Progress Notes (Signed)
[facility] PROGRESS NOTE  [name] [id number] DOB: 03/16/1930 DOA: 01/19/2013 PCP: Redge Gainer, MD   HPI: [name] is a 79 y.o. male with past medical history of diabetes, hypertension and history of spina bifida. Patient given to the hospital because of worsening of bilateral gluteal cellulitis. Patient has bilateral gluteal cellulitis for the past 6 months, he's been following with wound center in [place], [place]. About 10 days ago he tried Medihoney gel over his wounds, his home health nurse checked on him today and the wound about tripled since last week. She called his doctor in the wound center and they asked him to bring into the ER.  In the ED has bilateral gluteal cellulitis with purulent discharge, his UA also is consistent with UTI and has WBC count of 14.1.  Assessment/Plan: Cellulitis of the gluteal region  -Bilateral cellulitis involving the gluteal region, symmetrical kissing ulcers.  -Recent worsening, now with purulent discharge. Cultures fromt he wound were negative. Blood cultures are negative so far.  - CT scan of pelvis negative for  abscess formation.  -Wound and blood cultures obtained in the emergency department nd negative  -he completed 5 days of IV antibiotics with IV vancomycin and IV zosyn, will change to po augmentin. Today.  UTI  -Urinalysis consistent with UTI.  -Started on Zosyn to cover for the cellulitis, this should be sufficient for the UTI. Completed the course of the antibiotics.  Type 2 diabetes mellitus  -On glipizide, hold home medications.  -Carbohydrate modified diet, hemoglobin A1c is 7.9. Will start his home medications. .  CBG (last 3)   Recent Labs  01/22/13 2137 01/23/13 0632 01/23/13 1231  GLUCAP 188* 137* 230*     Hypertension  -Continue home medications.  Spina bifida  -Obviously when he was younger was not affecting his mobility.   Code Status: Full code  Family Communication: Plan discussed with the  patient  Disposition Plan:pending , home health PT.     Consultants:  none  Procedures:  hydrotherapy  Antibiotics:  Vancomycin 11/26  Zosyn 11/26  HPI/Subjective: Pain better, requesting wheelchair for ambulation. NO NEW COMPLAINTS. Wants to get hydrotherapy on discharge in [place].   Objective: Filed Vitals:   01/23/13 1235  BP: 117/57  Pulse: 66  Temp: 97.7 F (36.5 C)  Resp: 18    Intake/Output Summary (Last 24 hours) at 01/23/13 1542 Last data filed at 01/23/13 [id number]  Gross per 24 hour  Intake    840 ml  Output   1000 ml  Net   -160 ml   Filed Weights   01/19/13 1700  Weight: 111.131 kg (245 lb)    Exam:   General:  Alert afebrile comfortable  Cardiovascular: s1s2  Respiratory: ctab  Abdomen: soft NT ND BS+  Musculoskeletal: gluteal cellulitis with bandages .   Data Reviewed: Basic Metabolic Panel:  Recent Labs Lab 01/19/13 1455 01/20/13 0350 01/23/13 0600  NA 138 137 138  K 5.0 4.2 4.7  CL 103 102 104  CO2 28 28 26   GLUCOSE 212* 72 148*  BUN 22 18 15   CREATININE 1.17 1.15 1.26  CALCIUM 9.0 8.3* 8.6   Liver Function Tests: No results found for this basename: AST, ALT, ALKPHOS, BILITOT, PROT, ALBUMIN,  in the last 168 hours No results found for this basename: LIPASE, AMYLASE,  in the last 168 hours No results found for this basename: AMMONIA,  in the last 168 hours CBC:  Recent Labs Lab 01/19/13 1455 01/20/13 0350  01/21/13 0416 01/23/13 0600  WBC 14.1* 11.2* 10.4 8.8  NEUTROABS 9.8*  --   --   --   HGB 11.1* 10.8* 10.8* 11.0*  HCT 33.1* 32.7* 32.1* 32.9*  MCV 91.2 91.6 90.4 90.4  PLT 246 257 270 282   Cardiac Enzymes: No results found for this basename: CKTOTAL, CKMB, CKMBINDEX, TROPONINI,  in the last 168 hours BNP (last 3 results) No results found for this basename: PROBNP,  in the last 8760 hours CBG:  Recent Labs Lab 01/22/13 1105 01/22/13 1625 01/22/13 2137 01/23/13 0632 01/23/13 1231  GLUCAP 137*  156* 188* 137* 230*    Recent Results (from the past 240 hour(s))  CULTURE, BLOOD (ROUTINE X 2)     Status: None   Collection Time    01/19/13  5:18 PM      Result Value Range Status   Specimen Description BLOOD ARM LEFT   Final   Special Requests BOTTLES DRAWN AEROBIC AND ANAEROBIC 5CC   Final   Culture  Setup Time     Final   Value: 01/20/2013 02:57     Performed at [facility]   Culture     Final   Value:        BLOOD CULTURE RECEIVED NO GROWTH TO DATE CULTURE WILL BE HELD FOR 5 DAYS BEFORE ISSUING A FINAL NEGATIVE REPORT     Performed at [facility]   Report Status PENDING   Incomplete  WOUND CULTURE     Status: None   Collection Time    01/19/13  5:44 PM      Result Value Range Status   Specimen Description WOUND BUTTOCKS   Final   Special Requests Immunocompromised   Final   Gram Stain     Final   Value: ABUNDANT WBC PRESENT,BOTH PMN AND MONONUCLEAR     NO SQUAMOUS EPITHELIAL CELLS SEEN     MODERATE GRAM POSITIVE COCCI     IN PAIRS RARE YEAST     Performed at [facility]   Culture     Final   Value: MULTIPLE ORGANISMS PRESENT, NONE PREDOMINANT     Note: NO STAPHYLOCOCCUS AUREUS ISOLATED NO GROUP A STREP (S.PYOGENES) ISOLATED     Performed at [facility]   Report Status 01/22/2013 FINAL   Final     Studies: No results found.  Scheduled Meds: . amoxicillin-clavulanate  1 tablet Oral Q12H  . aspirin EC  81 mg Oral Daily  . buPROPion  300 mg Oral Daily  . collagenase   Topical Daily  . heparin  5,000 Units Subcutaneous Q8H  . insulin aspart  0-9 Units Subcutaneous TID WC  . irbesartan  75 mg Oral Daily  . metoprolol tartrate  25 mg Oral BID   Continuous Infusions:    Principal Problem:   Abscess or cellulitis of gluteal region Active Problems:   Type 2 diabetes mellitus with diabetic nephropathy   HYPERTENSION, UNSPECIFIED   Spina bifida   Cellulitis   UTI (lower urinary tract infection)    Time spent: 25  minutes    [facility] Pager [phone] 7PM-7AM, please contact night-coverage at [email or web address], password [facility] 01/23/2013, 3:42 PM  LOS: 4 days

## 2013-01-24 DIAGNOSIS — E119 Type 2 diabetes mellitus without complications: Secondary | ICD-10-CM | POA: Diagnosis not present

## 2013-01-24 DIAGNOSIS — I10 Essential (primary) hypertension: Secondary | ICD-10-CM | POA: Diagnosis not present

## 2013-01-24 DIAGNOSIS — N390 Urinary tract infection, site not specified: Secondary | ICD-10-CM | POA: Diagnosis not present

## 2013-01-24 DIAGNOSIS — L0291 Cutaneous abscess, unspecified: Secondary | ICD-10-CM | POA: Diagnosis not present

## 2013-01-24 LAB — GLUCOSE, CAPILLARY: Glucose-Capillary: 165 mg/dL — ABNORMAL HIGH (ref 70–99)

## 2013-01-24 MED ORDER — AMOXICILLIN-POT CLAVULANATE 875-125 MG PO TABS: 1.0000 | ORAL_TABLET | Freq: Two times a day (BID) | ORAL | Status: DC

## 2013-01-24 MED ORDER — COLLAGENASE 250 UNIT/GM EX OINT: TOPICAL_OINTMENT | Freq: Every day | CUTANEOUS | Status: DC

## 2013-01-24 NOTE — Discharge Summary (Signed)
Physician Discharge Summary  [name] [id number] DOB: 03-Jan-1931 DOA: 01/19/2013  PCP: Redge Gainer, MD  Admit date: 01/19/2013 Discharge date: 01/24/2013  Time spent: 30 minutes  Recommendations for Outpatient Follow-up:  1. Follow up with PCP in one week 2. Follow up with wound care in [place]  3. Follow up with home health PT AND RN.   Discharge Diagnoses:  Principal Problem:   Abscess or cellulitis of gluteal region Active Problems:   Type 2 diabetes mellitus with diabetic nephropathy   HYPERTENSION, UNSPECIFIED   Spina bifida   Cellulitis   UTI (lower urinary tract infection)   Discharge Condition: improved  Diet recommendation: carb modified diet  Filed Weights   01/19/13 1700  Weight: 111.131 kg (245 lb)    History of present illness:  [name] is a 79 y.o. male with past medical history of diabetes, hypertension and history of spina bifida. Patient given to the hospital because of worsening of bilateral gluteal cellulitis. Patient has bilateral gluteal cellulitis for the past 6 months, he's been following with wound center in [place], [place]. About 10 days ago he tried Medihoney gel over his wounds, his home health nurse checked on him today and the wound about tripled since last week. She called his doctor in the wound center and they asked him to bring into the ER.  In the ED has bilateral gluteal cellulitis with purulent discharge, his UA also is consistent with UTI and has WBC count of 14.1.   Hospital Course:   Cellulitis of the gluteal region  -Bilateral cellulitis involving the gluteal region, symmetrical kissing ulcers.  -Recent worsening, now with purulent discharge. Cultures fromt he wound were negative. Blood cultures are negative so far.  - CT scan of pelvis negative for abscess formation.  -Wound and blood cultures obtained in the emergency department nd negative  -he completed 5 days of IV antibiotics with IV vancomycin and IV zosyn, he  will complete the course with 5 more days of augmentin.  UTI  -Urinalysis consistent with UTI.   Completed the course of the antibiotics.  Type 2 diabetes mellitus  -On glipizide,  -Carbohydrate modified diet, hemoglobin A1c is 7.9. Will start his home medications. .  CBG (last 3)   Recent Labs  01/23/13 2219 01/24/13 0701 01/24/13 1108  GLUCAP 182* 165* 174*      Hypertension  -Continue home medications.  Spina bifida  -Obviously when he was younger was not affecting his mobility.   Procedures:  CT pelvis  Consultations:  Wound care consulted.   Discharge Exam: Filed Vitals:   01/24/13 0659  BP: 132/55  Pulse: 68  Temp: 97.9 F (36.6 C)  Resp: 18   General: Alert afebrile comfortable  Cardiovascular: s1s2  Respiratory: ctab  Abdomen: soft NT ND BS+  Musculoskeletal: gluteal cellulitis with bandages .     Discharge Instructions  Discharge Orders   Future Appointments Provider Department Dept Phone   03/01/2013 11:45 AM Larey Dresser, MD [place] Office [phone]   03/08/2013 10:00 AM Chipper Herb, MD [place] [phone]   Future Orders Complete By Expires   Diet - low sodium heart healthy  As directed    Discharge instructions  As directed    Comments:     Follow up with pcp in one week Follow up with outpatient hydrotherapy at wound clinic.       Medication List         acetaminophen 500  MG tablet  Commonly known as:  TYLENOL  Take 500 mg by mouth every 6 (six) hours as needed for mild pain.     amoxicillin-clavulanate 875-125 MG per tablet  Commonly known as:  AUGMENTIN  Take 1 tablet by mouth every 12 (twelve) hours.     aspirin 81 MG tablet  Take 81 mg by mouth daily.     buPROPion 300 MG 24 hr tablet  Commonly known as:  WELLBUTRIN XL  Take 300 mg by mouth every morning.     Co Q-10 100 MG Caps  Take 1 capsule by mouth 2 (two) times daily.     collagenase ointment  Commonly known  as:  SANTYL  Apply topically daily.     ezetimibe-simvastatin 10-20 MG per tablet  Commonly known as:  VYTORIN  Take 1 tablet by mouth 2 (two) times daily.     Fish Oil 1000 MG Caps  Take 1 capsule by mouth 2 (two) times daily.     glipiZIDE 10 MG 24 hr tablet  Commonly known as:  GLUCOTROL XL  Take 10 mg by mouth daily.     hydrocortisone 2.5 % rectal cream  Commonly known as:  PROCTOCREAM-HC  Place rectally 2 (two) times daily.     hydrocortisone 25 MG suppository  Commonly known as:  ANUSOL-HC  Place 1 suppository (25 mg total) rectally 2 (two) times daily.     metoprolol tartrate 25 MG tablet  Commonly known as:  LOPRESSOR  Take 25 mg by mouth 2 (two) times daily.     nitroGLYCERIN 0.4 MG SL tablet  Commonly known as:  NITROSTAT  Place 1 tablet (0.4 mg total) under the tongue every 5 (five) minutes as needed.     NON FORMULARY  cran-actin 100mg  1 tablet daily     nystatin 100000 UNIT/GM Powd  Apply 100,000 g topically 3 (three) times daily.     nystatin-triamcinolone ointment  Commonly known as:  MYCOLOG  Apply topically 2 (two) times daily.     polyethylene glycol powder powder  Commonly known as:  GLYCOLAX/MIRALAX  Take 17 g by mouth 2 (two) times daily as needed.     PRESERVISION AREDS 2 PO  Take 1 capsule by mouth 2 (two) times daily.     valsartan 80 MG tablet  Commonly known as:  DIOVAN  Take 80 mg by mouth daily.     vitamin C 1000 MG tablet  Take 1,000 mg by mouth daily.     Vitamin D-3 5000 UNITS Tabs  Take 1 tablet by mouth daily.       Allergies  Allergen Reactions  . Aspirin     In high doses  . Sulfonamide Derivatives     REACTION: pruitis patient cant remember its been so long    . Nitrofurantoin Rash       Follow-up Information   Follow up with Redge Gainer, MD. Schedule an appointment as soon as possible for a visit in 1 week.   Specialty:  Family Medicine   Contact information:   [address] [place]  [address] [phone]        The results of significant diagnostics from this hospitalization (including imaging, microbiology, ancillary and laboratory) are listed below for reference.    Significant Diagnostic Studies: Ct Pelvis W Contrast  01/19/2013   CLINICAL DATA:  Worsening bilateral gluteal cellulitis. Patient is head cellulitis for the past 6 months. Purulent discharge. Urinary analysis consistent with urinary tract infection. White  cell count 14.1.  EXAM: CT PELVIS WITH CONTRAST  TECHNIQUE: Multidetector CT imaging of the pelvis was performed using the standard protocol following the bolus administration of intravenous contrast.  CONTRAST:  163mL OMNIPAQUE IOHEXOL 300 MG/ML  SOLN  COMPARISON:  12/21/2006  FINDINGS: There is diffuse and prominent fatty atrophy of the gluteal muscles bilaterally. Diffuse atrophy of the iliopsoas muscles bilaterally, greater on the right side. Minimal infiltration in the subcutaneous fat over the gluteal and perineal regions with suggestion of mild skin thickening. Changes likely represent cellulitis. No soft tissue fluid collections or gas collections are demonstrated to suggest abscess. Calcification of the prostate gland. Diffuse thickening of the bladder wall consistent with cystitis. No free or loculated pelvic fluid collections. No significant pelvic lymphadenopathy. Visualized large and small bowel loops are not distended. No destructive bone lesions are appreciated. Degenerative changes in the visualized lower lumbar spine.  IMPRESSION: No discrete soft tissue abscess is demonstrated. Minimal infiltration in the subcutaneous fat consistent with cellulitis. Diffuse fatty atrophy of the gluteal and iliopsoas muscles.   Electronically Signed   By: Lucienne Capers M.D.   On: 01/19/2013 22:13    Microbiology: Recent Results (from the past 240 hour(s))  CULTURE, BLOOD (ROUTINE X 2)     Status: None   Collection Time    01/19/13  5:18 PM      Result Value  Range Status   Specimen Description BLOOD ARM LEFT   Final   Special Requests BOTTLES DRAWN AEROBIC AND ANAEROBIC 5CC   Final   Culture  Setup Time     Final   Value: 01/20/2013 02:57     Performed at [facility]   Culture     Final   Value:        BLOOD CULTURE RECEIVED NO GROWTH TO DATE CULTURE WILL BE HELD FOR 5 DAYS BEFORE ISSUING A FINAL NEGATIVE REPORT     Performed at [facility]   Report Status PENDING   Incomplete  WOUND CULTURE     Status: None   Collection Time    01/19/13  5:44 PM      Result Value Range Status   Specimen Description WOUND BUTTOCKS   Final   Special Requests Immunocompromised   Final   Gram Stain     Final   Value: ABUNDANT WBC PRESENT,BOTH PMN AND MONONUCLEAR     NO SQUAMOUS EPITHELIAL CELLS SEEN     MODERATE GRAM POSITIVE COCCI     IN PAIRS RARE YEAST     Performed at [facility]   Culture     Final   Value: MULTIPLE ORGANISMS PRESENT, NONE PREDOMINANT     Note: NO STAPHYLOCOCCUS AUREUS ISOLATED NO GROUP A STREP (S.PYOGENES) ISOLATED     Performed at [facility]   Report Status 01/22/2013 FINAL   Final     Labs: Basic Metabolic Panel:  Recent Labs Lab 01/19/13 1455 01/20/13 0350 01/23/13 0600  NA 138 137 138  K 5.0 4.2 4.7  CL 103 102 104  CO2 28 28 26   GLUCOSE 212* 72 148*  BUN 22 18 15   CREATININE 1.17 1.15 1.26  CALCIUM 9.0 8.3* 8.6   Liver Function Tests: No results found for this basename: AST, ALT, ALKPHOS, BILITOT, PROT, ALBUMIN,  in the last 168 hours No results found for this basename: LIPASE, AMYLASE,  in the last 168 hours No results found for this basename: AMMONIA,  in the last 168 hours CBC:  Recent Labs Lab 01/19/13 1455 01/20/13 0350 01/21/13 0416 01/23/13 0600  WBC 14.1* 11.2* 10.4 8.8  NEUTROABS 9.8*  --   --   --   HGB 11.1* 10.8* 10.8* 11.0*  HCT 33.1* 32.7* 32.1* 32.9*  MCV 91.2 91.6 90.4 90.4  PLT 246 257 270 282   Cardiac Enzymes: No results found for this  basename: CKTOTAL, CKMB, CKMBINDEX, TROPONINI,  in the last 168 hours BNP: BNP (last 3 results) No results found for this basename: PROBNP,  in the last 8760 hours CBG:  Recent Labs Lab 01/23/13 0632 01/23/13 1231 01/23/13 1730 01/23/13 2219 01/24/13 0701  GLUCAP 137* 230* 223* 182* 165*       Signed:  Waylan Busta  [facility] 01/24/2013, 9:55 AM

## 2013-01-24 NOTE — Progress Notes (Signed)
Physical Therapy Treatment Patient Details Name: [name] MRN: [id number] DOB: Sep 22, 1930 Today's Date: 01/24/2013 Time: [id number] PT Time Calculation (min): 24 min  PT Assessment / Plan / Recommendation  History of Present Illness Pt is an 79 y/o male admitted with bilateral gluteal cellulitis with ulcers. Significant PMH includes DM with diabetic neuropathy on bilateral feet, HTN, spina bifida with sensation changes down L ankle to foot.   PT Comments   Pt very pleasant & willing to participate in therapy.  Increased ambulation distance.  Pt does fatigue fairly quickly but he states this is baseline for him.     Follow Up Recommendations  Home health PT     Does the patient have the potential to tolerate intense rehabilitation     Barriers to Discharge        Equipment Recommendations  None recommended by PT    Recommendations for Other Services    Frequency Min 3X/week   Progress towards PT Goals Progress towards PT goals: Progressing toward goals  Plan Current plan remains appropriate    Precautions / Restrictions Precautions Precautions: Fall Restrictions Weight Bearing Restrictions: No       Mobility  Bed Mobility Bed Mobility: Supine to Sit;Sitting - Scoot to Edge of Bed Supine to Sit: 4: Min guard;With rails Sitting - Scoot to Edge of Bed: 5: Supervision Details for Bed Mobility Assistance: Incr time & effortful but no physical (A) needed.   Transfers Transfers: Sit to Stand;Stand to Sit Sit to Stand: 4: Min guard;With upper extremity assist;From bed Stand to Sit: 4: Min guard;With upper extremity assist;With armrests;To chair/3-in-1 Details for Transfer Assistance: cues to reinforce hand placement Ambulation/Gait Ambulation/Gait Assistance: 4: Min guard Ambulation Distance (Feet): 100 Feet Assistive device: Rolling walker Ambulation/Gait Assistance Details: Pt relies heavily on UE's due to LE weakness  Gait Pattern: Step-through pattern;Decreased  stride length Gait velocity: Decreased General Gait Details: fatigues quickly.  Pt states this is baseline.   Stairs: No Wheelchair Mobility Wheelchair Mobility: No      PT Goals (current goals can now be found in the care plan section) Acute Rehab PT Goals Patient Stated Goal: To return home with wife PT Goal Formulation: With patient/family Time For Goal Achievement: 01/28/13 Potential to Achieve Goals: Good  Visit Information  Last PT Received On: 01/24/13 Assistance Needed: +1 History of Present Illness: Pt is an 79 y/o male admitted with bilateral gluteal cellulitis with ulcers. Significant PMH includes DM with diabetic neuropathy on bilateral feet, HTN, spina bifida with sensation changes down L ankle to foot.    Subjective Data  Patient Stated Goal: To return home with wife   Cognition  Cognition Arousal/Alertness: Awake/alert Behavior During Therapy: WFL for tasks assessed/performed Overall Cognitive Status: Within Functional Limits for tasks assessed    Balance     End of Session PT - End of Session Activity Tolerance: Patient tolerated treatment well Patient left: in chair;with call bell/phone within reach Nurse Communication: Mobility status   GP     Sena Hitch 01/24/2013, 1:55 PM   Sarajane Marek, PTA [phone] 01/24/2013

## 2013-01-24 NOTE — Progress Notes (Signed)
Physical Therapy Wound Treatment Patient Details  Name: [name] MRN: [id number] Date of Birth: 02-26-1930  Today's Date: 01/24/2013 Time: [id number] Time Calculation (min): 39 min  Subjective  Subjective: Pt stating he may leave today.  Pain Score: Pain Score: 2   Wound Assessment  Wound 01/19/13 Other (Comment) Buttocks Right;Left;Medial both buttocks are red with purulent drainage, in the center of the buttocks there are nickel sized area of eshcar; 01/21/13 Stage III PU updated by [place] nurse (Active)  Site / Wound Assessment Red;Yellow 01/24/2013 11:35 AM  % Wound base Red or Granulating 60% 01/24/2013 11:35 AM  % Wound base Yellow 40% 01/24/2013 11:35 AM  % Wound base Black 0% 01/24/2013 11:35 AM  % Wound base Other (Comment) 0% 01/24/2013 11:35 AM  Peri-wound Assessment Erythema (blanchable) 01/24/2013 11:35 AM  Wound Length (cm) 10 cm 01/21/2013  1:07 PM  Wound Width (cm) 7 cm 01/21/2013  1:07 PM  Wound Depth (cm) 0 cm 01/21/2013  1:07 PM  Margins Unattacted edges (unapproximated) 01/24/2013 11:35 AM  Closure None 01/24/2013 11:35 AM  Drainage Amount Moderate 01/24/2013 11:35 AM  Drainage Description Serosanguineous 01/24/2013 11:35 AM  Treatment Debridement (Selective);Hydrotherapy (Pulse lavage) 01/24/2013 11:35 AM  Dressing Type Silver hydrofiber;ABD;Tape dressing 01/24/2013 11:35 AM  Dressing Changed Changed 01/24/2013 11:35 AM  Dressing Status Clean 01/24/2013 11:35 AM   Hydrotherapy Pulsed lavage therapy - wound location: buttocks Pulsed Lavage with Suction (psi): 4 psi (4-8) Pulsed Lavage with Suction - Normal Saline Used: 1000 mL Pulsed Lavage Tip: Tip with splash shield Selective Debridement Selective Debridement - Location: sacrum Selective Debridement - Tools Used: Forceps;Scissors Selective Debridement - Tissue Removed: yellow slough   Wound Assessment and Plan  Wound Therapy - Assess/Plan/Recommendations Wound Therapy - Clinical Statement: Progressing with  loosening and removal of eschar/slough.   Hydrotherapy Plan: Debridement;Dressing change;Patient/family education;Pulsatile lavage with suction Wound Therapy - Frequency: 6X / week Wound Therapy - Follow Up Recommendations: Home health RN  Wound Therapy Goals- Improve the function of patient's integumentary system by progressing the wound(s) through the phases of wound healing (inflammation - proliferation - remodeling) by: Decrease Necrotic Tissue - Progress: Progressing toward goal Increase Granulation Tissue - Progress: Progressing toward goal Improve Drainage Characteristics - Progress: Progressing toward goal  Goals will be updated until maximal potential achieved or discharge criteria met.  Discharge criteria: when goals achieved, discharge from hospital, MD decision/surgical intervention, no progress towards goals, refusal/missing three consecutive treatments without notification or medical reason.  GP     Cristie Mckinney 01/24/2013, 11:40 AM  Suanne Marker PT [phone]

## 2013-01-24 NOTE — Care Management Note (Signed)
Case Manager spoke with patient concerning need for hydrotherapy at wound care center. Patient stated although he lives in [place] he wants to come to [facility] for treatment. Appointment made for Thursday, December 11,2014 @ 9:00am with [facility] [phone] . Patient will resume Home Health with [facility].

## 2013-01-25 ENCOUNTER — Other Ambulatory Visit: Payer: Self-pay | Admitting: *Deleted

## 2013-01-25 ENCOUNTER — Telehealth: Payer: Self-pay | Admitting: *Deleted

## 2013-01-25 DIAGNOSIS — E46 Unspecified protein-calorie malnutrition: Secondary | ICD-10-CM | POA: Diagnosis not present

## 2013-01-25 DIAGNOSIS — R197 Diarrhea, unspecified: Secondary | ICD-10-CM

## 2013-01-25 DIAGNOSIS — I10 Essential (primary) hypertension: Secondary | ICD-10-CM | POA: Diagnosis not present

## 2013-01-25 DIAGNOSIS — D649 Anemia, unspecified: Secondary | ICD-10-CM | POA: Diagnosis not present

## 2013-01-25 DIAGNOSIS — E785 Hyperlipidemia, unspecified: Secondary | ICD-10-CM | POA: Diagnosis not present

## 2013-01-25 DIAGNOSIS — L0231 Cutaneous abscess of buttock: Secondary | ICD-10-CM | POA: Diagnosis not present

## 2013-01-25 DIAGNOSIS — I2510 Atherosclerotic heart disease of native coronary artery without angina pectoris: Secondary | ICD-10-CM | POA: Diagnosis not present

## 2013-01-25 DIAGNOSIS — E119 Type 2 diabetes mellitus without complications: Secondary | ICD-10-CM | POA: Diagnosis not present

## 2013-01-25 DIAGNOSIS — Z87440 Personal history of urinary (tract) infections: Secondary | ICD-10-CM | POA: Diagnosis not present

## 2013-01-25 DIAGNOSIS — L0390 Cellulitis, unspecified: Secondary | ICD-10-CM

## 2013-01-25 NOTE — Telephone Encounter (Signed)
_Pt just came home from hospital yesterday on Augmentin, he is oozing stool constantly. Do we want to change ABT or call in something for diarrhea? He has cellulitis in gluteal area

## 2013-01-25 NOTE — Telephone Encounter (Signed)
V/o given by Dr. Laurance Flatten to stop Augmentin and use immodium as directed on box.

## 2013-01-26 ENCOUNTER — Other Ambulatory Visit: Payer: Self-pay | Admitting: *Deleted

## 2013-01-26 ENCOUNTER — Ambulatory Visit: Payer: Medicare Other | Admitting: Family Medicine

## 2013-01-26 DIAGNOSIS — Q059 Spina bifida, unspecified: Secondary | ICD-10-CM

## 2013-01-26 DIAGNOSIS — E46 Unspecified protein-calorie malnutrition: Secondary | ICD-10-CM | POA: Diagnosis not present

## 2013-01-26 DIAGNOSIS — Z5181 Encounter for therapeutic drug level monitoring: Secondary | ICD-10-CM | POA: Diagnosis not present

## 2013-01-26 DIAGNOSIS — D649 Anemia, unspecified: Secondary | ICD-10-CM | POA: Diagnosis not present

## 2013-01-26 DIAGNOSIS — I2510 Atherosclerotic heart disease of native coronary artery without angina pectoris: Secondary | ICD-10-CM | POA: Diagnosis not present

## 2013-01-26 DIAGNOSIS — E1121 Type 2 diabetes mellitus with diabetic nephropathy: Secondary | ICD-10-CM

## 2013-01-26 DIAGNOSIS — I10 Essential (primary) hypertension: Secondary | ICD-10-CM | POA: Diagnosis not present

## 2013-01-26 DIAGNOSIS — E119 Type 2 diabetes mellitus without complications: Secondary | ICD-10-CM | POA: Diagnosis not present

## 2013-01-26 DIAGNOSIS — L0231 Cutaneous abscess of buttock: Secondary | ICD-10-CM | POA: Diagnosis not present

## 2013-01-26 LAB — CULTURE, BLOOD (ROUTINE X 2)

## 2013-01-27 DIAGNOSIS — L0231 Cutaneous abscess of buttock: Secondary | ICD-10-CM | POA: Diagnosis not present

## 2013-01-27 DIAGNOSIS — D649 Anemia, unspecified: Secondary | ICD-10-CM | POA: Diagnosis not present

## 2013-01-27 DIAGNOSIS — E46 Unspecified protein-calorie malnutrition: Secondary | ICD-10-CM | POA: Diagnosis not present

## 2013-01-27 DIAGNOSIS — I10 Essential (primary) hypertension: Secondary | ICD-10-CM | POA: Diagnosis not present

## 2013-01-27 DIAGNOSIS — E119 Type 2 diabetes mellitus without complications: Secondary | ICD-10-CM | POA: Diagnosis not present

## 2013-01-27 DIAGNOSIS — I2510 Atherosclerotic heart disease of native coronary artery without angina pectoris: Secondary | ICD-10-CM | POA: Diagnosis not present

## 2013-01-28 DIAGNOSIS — D649 Anemia, unspecified: Secondary | ICD-10-CM | POA: Diagnosis not present

## 2013-01-28 DIAGNOSIS — E46 Unspecified protein-calorie malnutrition: Secondary | ICD-10-CM | POA: Diagnosis not present

## 2013-01-28 DIAGNOSIS — I2510 Atherosclerotic heart disease of native coronary artery without angina pectoris: Secondary | ICD-10-CM | POA: Diagnosis not present

## 2013-01-28 DIAGNOSIS — L0231 Cutaneous abscess of buttock: Secondary | ICD-10-CM | POA: Diagnosis not present

## 2013-01-28 DIAGNOSIS — I10 Essential (primary) hypertension: Secondary | ICD-10-CM | POA: Diagnosis not present

## 2013-01-28 DIAGNOSIS — E119 Type 2 diabetes mellitus without complications: Secondary | ICD-10-CM | POA: Diagnosis not present

## 2013-01-31 DIAGNOSIS — E46 Unspecified protein-calorie malnutrition: Secondary | ICD-10-CM | POA: Diagnosis not present

## 2013-01-31 DIAGNOSIS — E119 Type 2 diabetes mellitus without complications: Secondary | ICD-10-CM | POA: Diagnosis not present

## 2013-01-31 DIAGNOSIS — I10 Essential (primary) hypertension: Secondary | ICD-10-CM | POA: Diagnosis not present

## 2013-01-31 DIAGNOSIS — D649 Anemia, unspecified: Secondary | ICD-10-CM | POA: Diagnosis not present

## 2013-01-31 DIAGNOSIS — L0231 Cutaneous abscess of buttock: Secondary | ICD-10-CM | POA: Diagnosis not present

## 2013-01-31 DIAGNOSIS — I2510 Atherosclerotic heart disease of native coronary artery without angina pectoris: Secondary | ICD-10-CM | POA: Diagnosis not present

## 2013-02-01 DIAGNOSIS — E46 Unspecified protein-calorie malnutrition: Secondary | ICD-10-CM | POA: Diagnosis not present

## 2013-02-01 DIAGNOSIS — I10 Essential (primary) hypertension: Secondary | ICD-10-CM | POA: Diagnosis not present

## 2013-02-01 DIAGNOSIS — L0231 Cutaneous abscess of buttock: Secondary | ICD-10-CM | POA: Diagnosis not present

## 2013-02-01 DIAGNOSIS — D649 Anemia, unspecified: Secondary | ICD-10-CM | POA: Diagnosis not present

## 2013-02-01 DIAGNOSIS — I2510 Atherosclerotic heart disease of native coronary artery without angina pectoris: Secondary | ICD-10-CM | POA: Diagnosis not present

## 2013-02-01 DIAGNOSIS — E119 Type 2 diabetes mellitus without complications: Secondary | ICD-10-CM | POA: Diagnosis not present

## 2013-02-02 DIAGNOSIS — L0231 Cutaneous abscess of buttock: Secondary | ICD-10-CM | POA: Diagnosis not present

## 2013-02-02 DIAGNOSIS — E46 Unspecified protein-calorie malnutrition: Secondary | ICD-10-CM | POA: Diagnosis not present

## 2013-02-02 DIAGNOSIS — I10 Essential (primary) hypertension: Secondary | ICD-10-CM | POA: Diagnosis not present

## 2013-02-02 DIAGNOSIS — D649 Anemia, unspecified: Secondary | ICD-10-CM | POA: Diagnosis not present

## 2013-02-02 DIAGNOSIS — E119 Type 2 diabetes mellitus without complications: Secondary | ICD-10-CM | POA: Diagnosis not present

## 2013-02-02 DIAGNOSIS — I2510 Atherosclerotic heart disease of native coronary artery without angina pectoris: Secondary | ICD-10-CM | POA: Diagnosis not present

## 2013-02-03 ENCOUNTER — Encounter (HOSPITAL_BASED_OUTPATIENT_CLINIC_OR_DEPARTMENT_OTHER): Payer: Medicare Other | Attending: Internal Medicine

## 2013-02-03 DIAGNOSIS — L8990 Pressure ulcer of unspecified site, unspecified stage: Secondary | ICD-10-CM | POA: Diagnosis not present

## 2013-02-03 DIAGNOSIS — Z79899 Other long term (current) drug therapy: Secondary | ICD-10-CM | POA: Insufficient documentation

## 2013-02-03 DIAGNOSIS — I2510 Atherosclerotic heart disease of native coronary artery without angina pectoris: Secondary | ICD-10-CM | POA: Diagnosis not present

## 2013-02-03 DIAGNOSIS — E1149 Type 2 diabetes mellitus with other diabetic neurological complication: Secondary | ICD-10-CM | POA: Insufficient documentation

## 2013-02-03 DIAGNOSIS — Z7982 Long term (current) use of aspirin: Secondary | ICD-10-CM | POA: Diagnosis not present

## 2013-02-03 DIAGNOSIS — N39498 Other specified urinary incontinence: Secondary | ICD-10-CM | POA: Diagnosis not present

## 2013-02-03 DIAGNOSIS — Q059 Spina bifida, unspecified: Secondary | ICD-10-CM | POA: Insufficient documentation

## 2013-02-03 DIAGNOSIS — I10 Essential (primary) hypertension: Secondary | ICD-10-CM | POA: Insufficient documentation

## 2013-02-03 DIAGNOSIS — Z951 Presence of aortocoronary bypass graft: Secondary | ICD-10-CM | POA: Insufficient documentation

## 2013-02-03 DIAGNOSIS — E1142 Type 2 diabetes mellitus with diabetic polyneuropathy: Secondary | ICD-10-CM | POA: Diagnosis not present

## 2013-02-03 DIAGNOSIS — L89309 Pressure ulcer of unspecified buttock, unspecified stage: Secondary | ICD-10-CM | POA: Insufficient documentation

## 2013-02-04 DIAGNOSIS — E119 Type 2 diabetes mellitus without complications: Secondary | ICD-10-CM | POA: Diagnosis not present

## 2013-02-04 DIAGNOSIS — E46 Unspecified protein-calorie malnutrition: Secondary | ICD-10-CM | POA: Diagnosis not present

## 2013-02-04 DIAGNOSIS — I2510 Atherosclerotic heart disease of native coronary artery without angina pectoris: Secondary | ICD-10-CM | POA: Diagnosis not present

## 2013-02-04 DIAGNOSIS — I10 Essential (primary) hypertension: Secondary | ICD-10-CM | POA: Diagnosis not present

## 2013-02-04 DIAGNOSIS — L0231 Cutaneous abscess of buttock: Secondary | ICD-10-CM | POA: Diagnosis not present

## 2013-02-04 DIAGNOSIS — D649 Anemia, unspecified: Secondary | ICD-10-CM | POA: Diagnosis not present

## 2013-02-04 NOTE — Progress Notes (Signed)
[facility]  NAME:  [name], [name] NO.:  [id number]  MEDICAL RECORD NO.:  [id number]      DATE OF BIRTH:  [birth year]  PHYSICIAN:  Ricard Dillon, M.D.      VISIT DATE:                                  OFFICE VISIT   CHIEF COMPLAINT:  This is an 79 year old man who is here for our review of pressure ulcers over his bilateral buttocks.  HISTORY:  This is a gentleman with a history of diabetes, hypertension, and spina bifida.  He was recently in hospital from January 19, 2013, through January 24, 2013, with worsening cellulitis of his bilateral gluteal areas.  The patient tells me that the problem in this area began in June of this year where he developed a dime-sized open area with surrounding erythema which was felt to be "yeast."  Ultimately towards the end of August, he was in the [facility] in [place].  He had different dressings applied but towards the middle of November, it was switched to [place].  Apparently after this, he developed a marked deterioration of the area and ended up in hospital and was treated for cellulitis surrounding his now enlarged bilateral gluteal ulcers. Cultures from the wounds were negative.  Blood cultures were also negative.  CT scan of the pelvis was negative for abscess.  He received vancomycin and Zosyn and completed a course of Augmentin.  PAST MEDICAL HISTORY: 1. Type 2 diabetes with diabetic neuropathy. 2. Hypertension. 3. Spina bifida. 4. Cellulitis. 5. History of UTIs. 6. Over the last 2-3 years, he is reasonably insensate in terms of his     bladder and has some incontinence but I believe he is doing in and     out catheterizations twice a day. 7. Coronary artery disease, status post CABG.  MEDICATION LIST: 1. Aspirin 81 daily. 2. Bupropion 300 mg daily. 3. Coenzyme Q 100 b.i.d. 4. Vytorin 10 mg b.i.d. 5. Glipizide 10 mg daily. 6. Metoprolol 25 b.i.d. 7. Valsartan 80 mg  daily. 8. Vitamin D3. 9. Fish oil 1000 b.i.d.  PHYSICAL EXAMINATION:  VITAL SIGNS:  Temperature 98.1, pulse 81, respirations 17, blood pressure 134/79, CBG is 83. RESPIRATORY:  Shallow, but otherwise clear air entry. CARDIAC:  Heart sounds are normal.  There are no signs of congestive heart failure. SKIN:  The areas in question where over the areas of his ischial tuberosities in a symmetric fashion bilaterally.  Roughly the same size measuring 7.3 x 4.1 x 0.1 on the right, and 6.8 x 5.9 x 0.1 on the left. There were degrees of erythema around both of these wounds, however, I do not believe that there is significant cellulitis at present.  Both of these wounds were covered with a "gritty" surface eschar.  This required debridement with a curette which he tolerated well.  There were also mild amounts of more adherence surface eschar which were also debrided.  IMPRESSIONS:  Bilateral pressure ulcers as described.  Recent deterioration with cellulitis treated in the hospital.  Both of these wounds have a surface eschar which will need further mechanical and enzymatic debridement.  Towards this end, we prescribed Santyl with a covering ABD pad.  These should be changed daily.  We spent a considerable amount of  time discussing offloading with the patient and his wife.  He will need some form of pressure relief mattress.  He already has some specialty cushion for his lift chair, however, I am not completely certain he has been compliant with keeping off this area, so I reinforce this.  He already has [facility] coming out who can assist with changing the dressing and ordering appropriate DME.  We will see him again in a week's time.          ______________________________ Ricard Dillon, M.D.     MGR/MEDQ  D:  02/03/2013  T:  02/04/2013  Job:  [id number]

## 2013-02-07 DIAGNOSIS — D649 Anemia, unspecified: Secondary | ICD-10-CM | POA: Diagnosis not present

## 2013-02-07 DIAGNOSIS — E119 Type 2 diabetes mellitus without complications: Secondary | ICD-10-CM | POA: Diagnosis not present

## 2013-02-07 DIAGNOSIS — L0231 Cutaneous abscess of buttock: Secondary | ICD-10-CM | POA: Diagnosis not present

## 2013-02-07 DIAGNOSIS — I2510 Atherosclerotic heart disease of native coronary artery without angina pectoris: Secondary | ICD-10-CM | POA: Diagnosis not present

## 2013-02-07 DIAGNOSIS — I10 Essential (primary) hypertension: Secondary | ICD-10-CM | POA: Diagnosis not present

## 2013-02-07 DIAGNOSIS — E46 Unspecified protein-calorie malnutrition: Secondary | ICD-10-CM | POA: Diagnosis not present

## 2013-02-09 DIAGNOSIS — E46 Unspecified protein-calorie malnutrition: Secondary | ICD-10-CM | POA: Diagnosis not present

## 2013-02-09 DIAGNOSIS — D649 Anemia, unspecified: Secondary | ICD-10-CM | POA: Diagnosis not present

## 2013-02-09 DIAGNOSIS — L0231 Cutaneous abscess of buttock: Secondary | ICD-10-CM | POA: Diagnosis not present

## 2013-02-09 DIAGNOSIS — I2510 Atherosclerotic heart disease of native coronary artery without angina pectoris: Secondary | ICD-10-CM | POA: Diagnosis not present

## 2013-02-09 DIAGNOSIS — E119 Type 2 diabetes mellitus without complications: Secondary | ICD-10-CM | POA: Diagnosis not present

## 2013-02-09 DIAGNOSIS — I10 Essential (primary) hypertension: Secondary | ICD-10-CM | POA: Diagnosis not present

## 2013-02-10 DIAGNOSIS — L89309 Pressure ulcer of unspecified buttock, unspecified stage: Secondary | ICD-10-CM | POA: Diagnosis not present

## 2013-02-10 DIAGNOSIS — I10 Essential (primary) hypertension: Secondary | ICD-10-CM | POA: Diagnosis not present

## 2013-02-10 DIAGNOSIS — Q059 Spina bifida, unspecified: Secondary | ICD-10-CM | POA: Diagnosis not present

## 2013-02-10 DIAGNOSIS — L8990 Pressure ulcer of unspecified site, unspecified stage: Secondary | ICD-10-CM | POA: Diagnosis not present

## 2013-02-10 DIAGNOSIS — E1142 Type 2 diabetes mellitus with diabetic polyneuropathy: Secondary | ICD-10-CM | POA: Diagnosis not present

## 2013-02-10 DIAGNOSIS — E1149 Type 2 diabetes mellitus with other diabetic neurological complication: Secondary | ICD-10-CM | POA: Diagnosis not present

## 2013-02-11 DIAGNOSIS — E119 Type 2 diabetes mellitus without complications: Secondary | ICD-10-CM | POA: Diagnosis not present

## 2013-02-11 DIAGNOSIS — E46 Unspecified protein-calorie malnutrition: Secondary | ICD-10-CM | POA: Diagnosis not present

## 2013-02-11 DIAGNOSIS — L0231 Cutaneous abscess of buttock: Secondary | ICD-10-CM | POA: Diagnosis not present

## 2013-02-11 DIAGNOSIS — I10 Essential (primary) hypertension: Secondary | ICD-10-CM | POA: Diagnosis not present

## 2013-02-11 DIAGNOSIS — I2510 Atherosclerotic heart disease of native coronary artery without angina pectoris: Secondary | ICD-10-CM | POA: Diagnosis not present

## 2013-02-11 DIAGNOSIS — D649 Anemia, unspecified: Secondary | ICD-10-CM | POA: Diagnosis not present

## 2013-02-15 DIAGNOSIS — E119 Type 2 diabetes mellitus without complications: Secondary | ICD-10-CM | POA: Diagnosis not present

## 2013-02-15 DIAGNOSIS — D649 Anemia, unspecified: Secondary | ICD-10-CM | POA: Diagnosis not present

## 2013-02-15 DIAGNOSIS — I2510 Atherosclerotic heart disease of native coronary artery without angina pectoris: Secondary | ICD-10-CM | POA: Diagnosis not present

## 2013-02-15 DIAGNOSIS — E46 Unspecified protein-calorie malnutrition: Secondary | ICD-10-CM | POA: Diagnosis not present

## 2013-02-15 DIAGNOSIS — L0231 Cutaneous abscess of buttock: Secondary | ICD-10-CM | POA: Diagnosis not present

## 2013-02-15 DIAGNOSIS — I10 Essential (primary) hypertension: Secondary | ICD-10-CM | POA: Diagnosis not present

## 2013-02-18 DIAGNOSIS — I2510 Atherosclerotic heart disease of native coronary artery without angina pectoris: Secondary | ICD-10-CM | POA: Diagnosis not present

## 2013-02-18 DIAGNOSIS — I10 Essential (primary) hypertension: Secondary | ICD-10-CM | POA: Diagnosis not present

## 2013-02-18 DIAGNOSIS — L0231 Cutaneous abscess of buttock: Secondary | ICD-10-CM | POA: Diagnosis not present

## 2013-02-18 DIAGNOSIS — D649 Anemia, unspecified: Secondary | ICD-10-CM | POA: Diagnosis not present

## 2013-02-18 DIAGNOSIS — E119 Type 2 diabetes mellitus without complications: Secondary | ICD-10-CM | POA: Diagnosis not present

## 2013-02-18 DIAGNOSIS — E46 Unspecified protein-calorie malnutrition: Secondary | ICD-10-CM | POA: Diagnosis not present

## 2013-02-19 ENCOUNTER — Other Ambulatory Visit: Payer: Self-pay | Admitting: Family Medicine

## 2013-02-21 DIAGNOSIS — I10 Essential (primary) hypertension: Secondary | ICD-10-CM | POA: Diagnosis not present

## 2013-02-21 DIAGNOSIS — I2510 Atherosclerotic heart disease of native coronary artery without angina pectoris: Secondary | ICD-10-CM | POA: Diagnosis not present

## 2013-02-21 DIAGNOSIS — E119 Type 2 diabetes mellitus without complications: Secondary | ICD-10-CM | POA: Diagnosis not present

## 2013-02-21 DIAGNOSIS — L0231 Cutaneous abscess of buttock: Secondary | ICD-10-CM | POA: Diagnosis not present

## 2013-02-21 DIAGNOSIS — D649 Anemia, unspecified: Secondary | ICD-10-CM | POA: Diagnosis not present

## 2013-02-21 DIAGNOSIS — E46 Unspecified protein-calorie malnutrition: Secondary | ICD-10-CM | POA: Diagnosis not present

## 2013-02-23 DIAGNOSIS — E119 Type 2 diabetes mellitus without complications: Secondary | ICD-10-CM

## 2013-02-23 DIAGNOSIS — E46 Unspecified protein-calorie malnutrition: Secondary | ICD-10-CM | POA: Diagnosis not present

## 2013-02-23 DIAGNOSIS — L03317 Cellulitis of buttock: Secondary | ICD-10-CM

## 2013-02-23 DIAGNOSIS — D649 Anemia, unspecified: Secondary | ICD-10-CM | POA: Diagnosis not present

## 2013-02-23 DIAGNOSIS — L0231 Cutaneous abscess of buttock: Secondary | ICD-10-CM

## 2013-02-23 DIAGNOSIS — I2510 Atherosclerotic heart disease of native coronary artery without angina pectoris: Secondary | ICD-10-CM

## 2013-02-23 DIAGNOSIS — I10 Essential (primary) hypertension: Secondary | ICD-10-CM

## 2013-02-28 ENCOUNTER — Telehealth: Payer: Self-pay | Admitting: Family Medicine

## 2013-02-28 DIAGNOSIS — I10 Essential (primary) hypertension: Secondary | ICD-10-CM | POA: Diagnosis not present

## 2013-02-28 DIAGNOSIS — L0231 Cutaneous abscess of buttock: Secondary | ICD-10-CM | POA: Diagnosis not present

## 2013-02-28 DIAGNOSIS — E119 Type 2 diabetes mellitus without complications: Secondary | ICD-10-CM | POA: Diagnosis not present

## 2013-02-28 DIAGNOSIS — E46 Unspecified protein-calorie malnutrition: Secondary | ICD-10-CM | POA: Diagnosis not present

## 2013-02-28 DIAGNOSIS — D649 Anemia, unspecified: Secondary | ICD-10-CM | POA: Diagnosis not present

## 2013-02-28 DIAGNOSIS — I2510 Atherosclerotic heart disease of native coronary artery without angina pectoris: Secondary | ICD-10-CM | POA: Diagnosis not present

## 2013-03-01 ENCOUNTER — Encounter: Payer: Self-pay | Admitting: *Deleted

## 2013-03-01 ENCOUNTER — Encounter: Payer: Self-pay | Admitting: Cardiology

## 2013-03-01 ENCOUNTER — Ambulatory Visit (INDEPENDENT_AMBULATORY_CARE_PROVIDER_SITE_OTHER): Payer: Medicare Other | Admitting: Cardiology

## 2013-03-01 VITALS — BP 124/66 | HR 78 | Ht 68.0 in | Wt 239.0 lb

## 2013-03-01 DIAGNOSIS — I2510 Atherosclerotic heart disease of native coronary artery without angina pectoris: Secondary | ICD-10-CM | POA: Diagnosis not present

## 2013-03-01 DIAGNOSIS — I10 Essential (primary) hypertension: Secondary | ICD-10-CM

## 2013-03-01 DIAGNOSIS — I6529 Occlusion and stenosis of unspecified carotid artery: Secondary | ICD-10-CM

## 2013-03-01 DIAGNOSIS — I658 Occlusion and stenosis of other precerebral arteries: Secondary | ICD-10-CM

## 2013-03-01 DIAGNOSIS — R0602 Shortness of breath: Secondary | ICD-10-CM | POA: Diagnosis not present

## 2013-03-01 DIAGNOSIS — E785 Hyperlipidemia, unspecified: Secondary | ICD-10-CM

## 2013-03-01 DIAGNOSIS — I6523 Occlusion and stenosis of bilateral carotid arteries: Secondary | ICD-10-CM

## 2013-03-01 MED ORDER — VALSARTAN 80 MG PO TABS: 80.0000 mg | ORAL_TABLET | Freq: Every day | ORAL | Status: DC

## 2013-03-01 NOTE — Patient Instructions (Addendum)
Take valsartan 80mg  daily.  Your physician has requested that you have an echocardiogram. Echocardiography is a painless test that uses sound waves to create images of your heart. It provides your doctor with information about the size and shape of your heart and how well your heart's chambers and valves are working. This procedure takes approximately one hour. There are no restrictions for this procedure.  Your physician has requested that you have a carotid duplex. This test is an ultrasound of the carotid arteries in your neck. It looks at blood flow through these arteries that supply the brain with blood. Allow one hour for this exam. There are no restrictions or special instructions.  Your physician wants you to follow-up in: 6 months with Dr Aundra Dubin. (July 2015).  You will receive a reminder letter in the mail two months in advance. If you don't receive a letter, please call our office to schedule the follow-up appointment.

## 2013-03-01 NOTE — Progress Notes (Signed)
Patient ID: [name], male   DOB: Jul 03, 1930, 80 y.o.   MRN: [id number] PCP: Dr. Laurance Flatten  80 yo with history of CAD s/p CABG presents for cardiology followup.  He has been seen by Dr. Verl Blalock in the past and is seen by me for the first time today.  His main problem recently has been gluteal pressure ulcers.  These became infected and he has had extensive treatment with antibiotics and wound care.  They are finally starting to heal.  He is somewhat unsteady walking and uses a walker.  No chest pain.  He is short of breath after walking about 300 feet.  He thinks that this is mostly a product of deconditioning.  No orthopnea or PND.   ECG: NSR, left axis deviation, old ASMI  Labs (9/14): LDL particle number 1377, LDL 74 Labs (11/14): K 4.7,creatinine 1.26  PMH: 1. Mild spina bifida 2. Type II diabetes 3. Hyperlipidemia 4. HTN 5. CKD 6. Anemia 7. PTSD 8. CAD: CABG 7/98 (Dr. Redmond Pulling).  Last Myoview in 2/07 showed EF 63%, no ischemia.  9. Gluteal decubitus ulcers 10. Obesity 11. Carotid stenosis: Carotid dopplers (5/13) with 40-59% bilateral ICA stenosis  SH: Married, lives in [place], retired from [employer], prior smoker.   FH: CAD  ROS: All systems reviewed and negative except as per HPI.   Current Outpatient Prescriptions  Medication Sig Dispense Refill  . acetaminophen (TYLENOL) 500 MG tablet Take 500 mg by mouth every 6 (six) hours as needed for mild pain.      . Ascorbic Acid (VITAMIN C) 1000 MG tablet Take 1,000 mg by mouth daily.        Marland Kitchen aspirin 81 MG tablet Take 81 mg by mouth daily.        Marland Kitchen buPROPion (WELLBUTRIN XL) 300 MG 24 hr tablet Take 300 mg by mouth every morning.        . Cholecalciferol (VITAMIN D-3) 5000 UNITS TABS Take 1 tablet by mouth daily.        . Coenzyme Q10 (CO Q-10) 100 MG CAPS Take 1 capsule by mouth 2 (two) times daily.       . collagenase (SANTYL) ointment Apply topically daily.  15 g  1  . ezetimibe-simvastatin (VYTORIN) 10-20 MG per  tablet Take 1 tablet by mouth 2 (two) times daily.      Marland Kitchen glipiZIDE (GLUCOTROL XL) 10 MG 24 hr tablet TAKE 1 TABLET DAILY  90 tablet  0  . metoprolol tartrate (LOPRESSOR) 25 MG tablet Take 25 mg by mouth 2 (two) times daily.      . Multiple Vitamins-Minerals (PRESERVISION AREDS 2 PO) Take 1 capsule by mouth 2 (two) times daily.      . nitroGLYCERIN (NITROSTAT) 0.4 MG SL tablet Place 1 tablet (0.4 mg total) under the tongue every 5 (five) minutes as needed.  25 tablet  6  . NON FORMULARY cran-actin 100mg  1 tablet daily      . nystatin (MYCOSTATIN/NYSTOP) 100000 UNIT/GM POWD Apply 100,000 g topically 3 (three) times daily.  15 g  3  . Omega-3 Fatty Acids (FISH OIL) 1000 MG CAPS Take 1 capsule by mouth 2 (two) times daily.        . polyethylene glycol powder (GLYCOLAX/MIRALAX) powder Take 17 g by mouth 2 (two) times daily as needed.  3350 g  1  . valsartan (DIOVAN) 80 MG tablet Take 1 tablet (80 mg total) by mouth daily.  90 tablet  3  No current facility-administered medications for this visit.    BP 124/66  Pulse 78  Ht 5\' 8"  (1.727 m)  Wt 108.41 kg (239 lb)  BMI 36.35 kg/m2 General: NAD Neck: No JVD, no thyromegaly or thyroid nodule.  Lungs: Clear to auscultation bilaterally with normal respiratory effort. CV: Nondisplaced PMI.  Heart regular S1/S2, no S3/S4, no murmur.  No peripheral edema.  No carotid bruit.  Normal pedal pulses.  Abdomen: Soft, nontender, no hepatosplenomegaly, no distention.  Neurologic: Alert and oriented x 3.  Psych: Normal affect. Extremities: No clubbing or cyanosis.   Assessment/Plan: 1. CAD: s/p CABG in 1998.  He has not had cardiac cath since that time.  Last Myoview was in 2007 and was nonischemic.  He has is short of breath after walking about 300 feet.  No chest pain.   - Continue ASA 81, statin, metoprolol, valsartan (need to refill valsartan today).  - I will get an echocardiogram to assess LV systolic function.  2. Carotid stenosis: He is due for  carotid dopplers.  I will arrange.  3. Hyperlipidemia: Followed by Dr. Laurance Flatten.  LDL particle number still elevated in 9/14.  Would consider increasing statin.  4. HTN: BP is under good control.   Loralie Champagne 03/02/2013

## 2013-03-02 ENCOUNTER — Telehealth (INDEPENDENT_AMBULATORY_CARE_PROVIDER_SITE_OTHER): Payer: Self-pay

## 2013-03-02 NOTE — Telephone Encounter (Signed)
Kesha from PT called stating she spoke with Pamala Hurry and was advised the PT form from Oct 1014 would be reprinted and faxed after Dr Johney Maine signed from. She states they have not received the form. I could not locate notation in epic [id number] but I have reprinted the form and given it to [facility] to have Dr Johney Maine sign and fax next week when he is back in office. The fax # is [phone].

## 2013-03-02 NOTE — Addendum Note (Signed)
Addended by: Katrine Coho on: 03/02/2013 08:49 AM   Modules accepted: Orders

## 2013-03-03 ENCOUNTER — Encounter (HOSPITAL_BASED_OUTPATIENT_CLINIC_OR_DEPARTMENT_OTHER): Payer: Medicare Other | Attending: Internal Medicine

## 2013-03-03 DIAGNOSIS — L89309 Pressure ulcer of unspecified buttock, unspecified stage: Secondary | ICD-10-CM | POA: Insufficient documentation

## 2013-03-03 DIAGNOSIS — L089 Local infection of the skin and subcutaneous tissue, unspecified: Secondary | ICD-10-CM | POA: Insufficient documentation

## 2013-03-03 DIAGNOSIS — L8992 Pressure ulcer of unspecified site, stage 2: Secondary | ICD-10-CM | POA: Insufficient documentation

## 2013-03-07 ENCOUNTER — Ambulatory Visit (HOSPITAL_COMMUNITY): Payer: Medicare Other | Attending: Cardiology

## 2013-03-07 ENCOUNTER — Encounter: Payer: Self-pay | Admitting: Cardiology

## 2013-03-07 DIAGNOSIS — I2510 Atherosclerotic heart disease of native coronary artery without angina pectoris: Secondary | ICD-10-CM | POA: Diagnosis not present

## 2013-03-07 DIAGNOSIS — I10 Essential (primary) hypertension: Secondary | ICD-10-CM | POA: Diagnosis not present

## 2013-03-07 DIAGNOSIS — I6523 Occlusion and stenosis of bilateral carotid arteries: Secondary | ICD-10-CM

## 2013-03-07 DIAGNOSIS — D649 Anemia, unspecified: Secondary | ICD-10-CM | POA: Diagnosis not present

## 2013-03-07 DIAGNOSIS — E46 Unspecified protein-calorie malnutrition: Secondary | ICD-10-CM | POA: Diagnosis not present

## 2013-03-07 DIAGNOSIS — E119 Type 2 diabetes mellitus without complications: Secondary | ICD-10-CM | POA: Diagnosis not present

## 2013-03-07 DIAGNOSIS — R0602 Shortness of breath: Secondary | ICD-10-CM

## 2013-03-07 DIAGNOSIS — Z951 Presence of aortocoronary bypass graft: Secondary | ICD-10-CM | POA: Diagnosis not present

## 2013-03-07 DIAGNOSIS — I658 Occlusion and stenosis of other precerebral arteries: Secondary | ICD-10-CM | POA: Insufficient documentation

## 2013-03-07 DIAGNOSIS — I6529 Occlusion and stenosis of unspecified carotid artery: Secondary | ICD-10-CM | POA: Diagnosis not present

## 2013-03-07 DIAGNOSIS — L0231 Cutaneous abscess of buttock: Secondary | ICD-10-CM | POA: Diagnosis not present

## 2013-03-07 DIAGNOSIS — L03317 Cellulitis of buttock: Secondary | ICD-10-CM | POA: Diagnosis not present

## 2013-03-07 DIAGNOSIS — E785 Hyperlipidemia, unspecified: Secondary | ICD-10-CM

## 2013-03-08 ENCOUNTER — Ambulatory Visit (INDEPENDENT_AMBULATORY_CARE_PROVIDER_SITE_OTHER): Payer: Medicare Other | Admitting: Family Medicine

## 2013-03-08 ENCOUNTER — Encounter: Payer: Self-pay | Admitting: Family Medicine

## 2013-03-08 VITALS — BP 152/71 | HR 78 | Temp 96.7°F | Ht 68.0 in | Wt 236.0 lb

## 2013-03-08 DIAGNOSIS — L0231 Cutaneous abscess of buttock: Secondary | ICD-10-CM

## 2013-03-08 DIAGNOSIS — R300 Dysuria: Secondary | ICD-10-CM

## 2013-03-08 DIAGNOSIS — E8881 Metabolic syndrome: Secondary | ICD-10-CM

## 2013-03-08 DIAGNOSIS — IMO0002 Reserved for concepts with insufficient information to code with codable children: Secondary | ICD-10-CM

## 2013-03-08 DIAGNOSIS — I2510 Atherosclerotic heart disease of native coronary artery without angina pectoris: Secondary | ICD-10-CM

## 2013-03-08 DIAGNOSIS — E559 Vitamin D deficiency, unspecified: Secondary | ICD-10-CM | POA: Diagnosis not present

## 2013-03-08 DIAGNOSIS — I10 Essential (primary) hypertension: Secondary | ICD-10-CM

## 2013-03-08 DIAGNOSIS — E785 Hyperlipidemia, unspecified: Secondary | ICD-10-CM | POA: Diagnosis not present

## 2013-03-08 DIAGNOSIS — Q059 Spina bifida, unspecified: Secondary | ICD-10-CM

## 2013-03-08 DIAGNOSIS — E119 Type 2 diabetes mellitus without complications: Secondary | ICD-10-CM

## 2013-03-08 DIAGNOSIS — Z23 Encounter for immunization: Secondary | ICD-10-CM | POA: Diagnosis not present

## 2013-03-08 DIAGNOSIS — L03317 Cellulitis of buttock: Secondary | ICD-10-CM

## 2013-03-08 LAB — POCT CBC
GRANULOCYTE PERCENT: 68 % (ref 37–80)
HCT, POC: 42.2 % — AB (ref 43.5–53.7)
Hemoglobin: 13.2 g/dL — AB (ref 14.1–18.1)
LYMPH, POC: 3.1 (ref 0.6–3.4)
MCH, POC: 28 pg (ref 27–31.2)
MCHC: 31.3 g/dL — AB (ref 31.8–35.4)
MCV: 89.6 fL (ref 80–97)
MPV: 6.9 fL (ref 0–99.8)
PLATELET COUNT, POC: 224 10*3/uL (ref 142–424)
POC GRANULOCYTE: 7.9 — AB (ref 2–6.9)
POC LYMPH %: 27.1 % (ref 10–50)
RBC: 4.7 M/uL (ref 4.69–6.13)
RDW, POC: 15 %
WBC: 11.6 10*3/uL — AB (ref 4.6–10.2)

## 2013-03-08 LAB — POCT URINALYSIS DIPSTICK
Bilirubin, UA: NEGATIVE
Blood, UA: NEGATIVE
GLUCOSE UA: NEGATIVE
Ketones, UA: NEGATIVE
Nitrite, UA: POSITIVE
Protein, UA: NEGATIVE
SPEC GRAV UA: 1.01
Urobilinogen, UA: NEGATIVE
pH, UA: 5

## 2013-03-08 LAB — POCT UA - MICROSCOPIC ONLY
Casts, Ur, LPF, POC: NEGATIVE
Crystals, Ur, HPF, POC: NEGATIVE
Mucus, UA: NEGATIVE
RBC, URINE, MICROSCOPIC: NEGATIVE
Yeast, UA: NEGATIVE

## 2013-03-08 LAB — POCT UA - MICROALBUMIN: Microalbumin Ur, POC: 20 mg/L

## 2013-03-08 NOTE — Patient Instructions (Addendum)
Continue current medications. Continue good therapeutic lifestyle changes which include good diet and exercise. Fall precautions discussed with patient. Schedule your flu vaccine if you haven't had it yet If you are over 78 years old - you may need Prevnar 4 or the adult Pneumonia vaccine. Continue blood sugar checks at home Follow directions from the [facility] regarding your gluteal abscess Try to drink plenty of fluids. Return FOBT The Prevnar vaccine may make your arm sore We will call you with the results of the lab work once those results are available

## 2013-03-08 NOTE — Progress Notes (Addendum)
Subjective:    Patient ID: [name], male    DOB: 10-Jul-1930, 80 y.o.   MRN: [id number]  HPI Pt here for follow up and management of chronic medical problems. Patient has been in the hospital recently for a gluteal abscess. With IV antibiotics this has begun to finally heal. He stays in the bed a lot. He does complain of some dysuria. He did bring a urine into the visit today. He will be given an FOBT to return. He will have lab work done today. Patient is in his rolling chair. His home blood sugars were reviewed and all of these looked very good. Be scanned into the record. These blood sugars are actually the December blood sugars.       Patient Active Problem List   Diagnosis Date Noted  . Cellulitis 01/19/2013  . Abscess or cellulitis of gluteal region 01/19/2013  . UTI (lower urinary tract infection) 01/19/2013  . Urinary incontinence 12/13/2012  . External hemorrhoids with complication 45/36/4680  . Chronic constipation 11/29/2012  . Metabolic syndrome 32/01/2481  . Diabetes mellitus, type 2 11/29/2012  . Hemorrhoid prolapse 11/29/2012  . Spina bifida 11/18/2012  . CAD, NATIVE VESSEL 03/08/2009  . Type 2 diabetes mellitus with diabetic nephropathy 10/29/2008  . MALNUTRITION, PROTEIN-CALORIE 10/29/2008  . HYPERLIPIDEMIA 10/29/2008  . HYPOKALEMIA 10/29/2008  . ANEMIA 10/29/2008  . PTSD 10/29/2008  . HYPERTENSION, UNSPECIFIED 10/29/2008  . RENAL INSUFFICIENCY, CHRONIC 10/29/2008   Outpatient Encounter Prescriptions as of 03/08/2013  Medication Sig  . acetaminophen (TYLENOL) 500 MG tablet Take 500 mg by mouth every 6 (six) hours as needed for mild pain.  Marland Kitchen aspirin 81 MG tablet Take 81 mg by mouth daily.    Marland Kitchen buPROPion (WELLBUTRIN XL) 300 MG 24 hr tablet Take 300 mg by mouth every morning.    . Cholecalciferol (VITAMIN D-3) 5000 UNITS TABS Take 1 tablet by mouth daily.    . Coenzyme Q10 (CO Q-10) 100 MG CAPS Take 1 capsule by mouth 2 (two) times daily.   . collagenase  (SANTYL) ointment Apply topically daily.  Marland Kitchen ezetimibe-simvastatin (VYTORIN) 10-20 MG per tablet Take 1 tablet by mouth 2 (two) times daily.  Marland Kitchen glipiZIDE (GLUCOTROL XL) 10 MG 24 hr tablet TAKE 1 TABLET DAILY  . metoprolol tartrate (LOPRESSOR) 25 MG tablet Take 25 mg by mouth 2 (two) times daily.  . Multiple Vitamins-Minerals (PRESERVISION AREDS 2 PO) Take 1 capsule by mouth 2 (two) times daily.  . NON FORMULARY cran-actin 130m 1 tablet daily  . Omega-3 Fatty Acids (FISH OIL) 1000 MG CAPS Take 1 capsule by mouth 2 (two) times daily.    . polyethylene glycol powder (GLYCOLAX/MIRALAX) powder Take 17 g by mouth 2 (two) times daily as needed.  . valsartan (DIOVAN) 80 MG tablet Take 1 tablet (80 mg total) by mouth daily.  . [DISCONTINUED] Ascorbic Acid (VITAMIN C) 1000 MG tablet Take 1,000 mg by mouth daily.    . [DISCONTINUED] nystatin (MYCOSTATIN/NYSTOP) 100000 UNIT/GM POWD Apply 100,000 g topically 3 (three) times daily.  . nitroGLYCERIN (NITROSTAT) 0.4 MG SL tablet Place 1 tablet (0.4 mg total) under the tongue every 5 (five) minutes as needed.    Review of Systems  Constitutional: Negative.   HENT: Negative.   Eyes: Negative.   Respiratory: Negative.   Cardiovascular: Negative.   Gastrointestinal: Negative.   Endocrine: Negative.   Genitourinary: Positive for dysuria.  Musculoskeletal: Negative.   Skin: Negative.   Allergic/Immunologic: Negative.   Neurological: Negative.   Hematological:  Negative.   Psychiatric/Behavioral: Negative.        Objective:   Physical Exam  Nursing note and vitals reviewed. Constitutional: He is oriented to person, place, and time. He appears well-developed and well-nourished. No distress.  Positive affect and his rolling wheelchair  HENT:  Head: Normocephalic and atraumatic.  Right Ear: External ear normal.  Left Ear: External ear normal.  Nose: Nose normal.  Mouth/Throat: Oropharynx is clear and moist. No oropharyngeal exudate.  Eyes:  Conjunctivae and EOM are normal. Pupils are equal, round, and reactive to light. Right eye exhibits no discharge. Left eye exhibits no discharge. No scleral icterus.  Neck: Normal range of motion. Neck supple. No tracheal deviation present. No thyromegaly present.  No carotid bruit  Cardiovascular: Normal rate, regular rhythm, normal heart sounds and intact distal pulses.  Exam reveals no gallop and no friction rub.   No murmur heard. At 60 per minute  Pulmonary/Chest: Effort normal and breath sounds normal. No respiratory distress. He has no wheezes. He has no rales.  Abdominal: Soft. Bowel sounds are normal. He exhibits no distension and no mass. There is no tenderness. There is no rebound and no guarding.  Musculoskeletal: Normal range of motion. He exhibits no edema and no tenderness.  Patient is unable to stand and is in his wheelchair due to the spina bifida  Lymphadenopathy:    He has no cervical adenopathy.  Neurological: He is alert and oriented to person, place, and time. No cranial nerve deficit.  Skin: Skin is warm and dry. No rash noted. No erythema. No pallor.  The gluteal abscess and wound were not examined today because it is being followed by the wound Center and regular visits by the home health nurse  Psychiatric: He has a normal mood and affect. His behavior is normal. Judgment and thought content normal.  Very positive affect considering his health circumstances   BP 152/71  Pulse 78  Temp(Src) 96.7 F (35.9 C) (Oral)  Ht _0  (1.727 m)  Wt 236 lb (107.049 kg)  BMI 35.89 kg/m2        Assessment & Plan:  1. HYPERLIPIDEMIA - POCT CBC - NMR, lipoprofile  2. HYPERTENSION, UNSPECIFIED - POCT CBC - BMP8+EGFR - Hepatic function panel  3. Diabetes mellitus, type 2 - POCT CBC - POCT UA - Microalbumin  4. CAD, NATIVE VESSEL - POCT CBC  5. Dysuria - POCT UA - Microscopic Only - POCT urinalysis dipstick  6. Vitamin D deficiency - Vit D  25 hydroxy (rtn  osteoporosis monitoring)  7. Abscess or cellulitis of gluteal region  8. Spina bifida  9. Metabolic syndrome  No orders of the defined types were placed in this encounter.   Patient Instructions  Continue current medications. Continue good therapeutic lifestyle changes which include good diet and exercise. Fall precautions discussed with patient. Schedule your flu vaccine if you haven't had it yet If you are over 7 years old - you may need Prevnar 24 or the adult Pneumonia vaccine. Continue blood sugar checks at home Follow directions from the [facility] regarding your gluteal abscess Try to drink plenty of fluids. Return FOBT The Prevnar vaccine may make your arm sore We will call you with the results of the lab work once those results are available   Arrie Senate MD

## 2013-03-08 NOTE — Addendum Note (Signed)
Addended by: Earlene Plater on: 03/08/2013 11:39 AM   Modules accepted: Orders

## 2013-03-08 NOTE — Addendum Note (Signed)
Addended by: Zannie Cove on: 03/08/2013 12:09 PM   Modules accepted: Orders

## 2013-03-09 LAB — HEPATIC FUNCTION PANEL
ALBUMIN: 4 g/dL (ref 3.5–4.7)
ALT: 13 IU/L (ref 0–44)
AST: 15 IU/L (ref 0–40)
Alkaline Phosphatase: 73 IU/L (ref 39–117)
Bilirubin, Direct: 0.13 mg/dL (ref 0.00–0.40)
Total Bilirubin: 0.5 mg/dL (ref 0.0–1.2)
Total Protein: 5.9 g/dL — ABNORMAL LOW (ref 6.0–8.5)

## 2013-03-09 LAB — VITAMIN D 25 HYDROXY (VIT D DEFICIENCY, FRACTURES): VIT D 25 HYDROXY: 40.1 ng/mL (ref 30.0–100.0)

## 2013-03-09 LAB — NMR, LIPOPROFILE
Cholesterol: 147 mg/dL (ref <200)
HDL CHOLESTEROL BY NMR: 47 mg/dL (ref >=40)
HDL PARTICLE NUMBER: 34.7 umol/L (ref >=30.5)
LDL Particle Number: 1147 nmol/L — ABNORMAL HIGH (ref <1000)
LDL Size: 20 nm — ABNORMAL LOW (ref >20.5)
LDLC SERPL CALC-MCNC: 74 mg/dL (ref <100)
LP-IR Score: 35 (ref <=45)
Small LDL Particle Number: 780 nmol/L — ABNORMAL HIGH (ref <=527)
TRIGLYCERIDES BY NMR: 130 mg/dL (ref <150)

## 2013-03-09 LAB — BMP8+EGFR
BUN/Creatinine Ratio: 17 (ref 10–22)
BUN: 23 mg/dL (ref 8–27)
CALCIUM: 9.4 mg/dL (ref 8.6–10.2)
CHLORIDE: 101 mmol/L (ref 97–108)
CO2: 22 mmol/L (ref 18–29)
Creatinine, Ser: 1.32 mg/dL — ABNORMAL HIGH (ref 0.76–1.27)
GFR calc Af Amer: 58 mL/min/{1.73_m2} — ABNORMAL LOW (ref >59)
GFR calc non Af Amer: 51 mL/min/{1.73_m2} — ABNORMAL LOW (ref >59)
Glucose: 185 mg/dL — ABNORMAL HIGH (ref 65–99)
Potassium: 5.6 mmol/L — ABNORMAL HIGH (ref 3.5–5.2)
Sodium: 139 mmol/L (ref 134–144)

## 2013-03-09 LAB — MICROALBUMIN, URINE: Microalbumin, Urine: 6.3 ug/mL (ref 0.0–17.0)

## 2013-03-10 ENCOUNTER — Other Ambulatory Visit: Payer: Self-pay | Admitting: *Deleted

## 2013-03-10 DIAGNOSIS — L89309 Pressure ulcer of unspecified buttock, unspecified stage: Secondary | ICD-10-CM | POA: Diagnosis not present

## 2013-03-10 DIAGNOSIS — L089 Local infection of the skin and subcutaneous tissue, unspecified: Secondary | ICD-10-CM | POA: Diagnosis not present

## 2013-03-10 DIAGNOSIS — L8992 Pressure ulcer of unspecified site, stage 2: Secondary | ICD-10-CM | POA: Diagnosis not present

## 2013-03-10 LAB — URINE CULTURE

## 2013-03-10 MED ORDER — CIPROFLOXACIN HCL 250 MG PO TABS: 250.0000 mg | ORAL_TABLET | Freq: Two times a day (BID) | ORAL | Status: DC

## 2013-03-14 DIAGNOSIS — D649 Anemia, unspecified: Secondary | ICD-10-CM | POA: Diagnosis not present

## 2013-03-14 DIAGNOSIS — E46 Unspecified protein-calorie malnutrition: Secondary | ICD-10-CM | POA: Diagnosis not present

## 2013-03-14 DIAGNOSIS — I10 Essential (primary) hypertension: Secondary | ICD-10-CM | POA: Diagnosis not present

## 2013-03-14 DIAGNOSIS — E119 Type 2 diabetes mellitus without complications: Secondary | ICD-10-CM | POA: Diagnosis not present

## 2013-03-14 DIAGNOSIS — I2510 Atherosclerotic heart disease of native coronary artery without angina pectoris: Secondary | ICD-10-CM | POA: Diagnosis not present

## 2013-03-14 DIAGNOSIS — L0231 Cutaneous abscess of buttock: Secondary | ICD-10-CM | POA: Diagnosis not present

## 2013-03-16 ENCOUNTER — Encounter: Payer: Self-pay | Admitting: Cardiology

## 2013-03-16 ENCOUNTER — Ambulatory Visit (HOSPITAL_COMMUNITY): Payer: Medicare Other | Attending: Cardiology | Admitting: Radiology

## 2013-03-16 ENCOUNTER — Other Ambulatory Visit (HOSPITAL_COMMUNITY): Payer: Medicare Other | Admitting: *Deleted

## 2013-03-16 DIAGNOSIS — R0602 Shortness of breath: Secondary | ICD-10-CM | POA: Diagnosis not present

## 2013-03-16 DIAGNOSIS — I679 Cerebrovascular disease, unspecified: Secondary | ICD-10-CM | POA: Insufficient documentation

## 2013-03-16 DIAGNOSIS — R0609 Other forms of dyspnea: Secondary | ICD-10-CM | POA: Diagnosis not present

## 2013-03-16 DIAGNOSIS — I2510 Atherosclerotic heart disease of native coronary artery without angina pectoris: Secondary | ICD-10-CM | POA: Diagnosis not present

## 2013-03-16 DIAGNOSIS — Z87891 Personal history of nicotine dependence: Secondary | ICD-10-CM | POA: Insufficient documentation

## 2013-03-16 DIAGNOSIS — I6523 Occlusion and stenosis of bilateral carotid arteries: Secondary | ICD-10-CM

## 2013-03-16 DIAGNOSIS — I10 Essential (primary) hypertension: Secondary | ICD-10-CM | POA: Diagnosis not present

## 2013-03-16 DIAGNOSIS — E785 Hyperlipidemia, unspecified: Secondary | ICD-10-CM

## 2013-03-16 DIAGNOSIS — R0989 Other specified symptoms and signs involving the circulatory and respiratory systems: Secondary | ICD-10-CM | POA: Insufficient documentation

## 2013-03-16 DIAGNOSIS — E119 Type 2 diabetes mellitus without complications: Secondary | ICD-10-CM | POA: Diagnosis not present

## 2013-03-16 DIAGNOSIS — I25810 Atherosclerosis of coronary artery bypass graft(s) without angina pectoris: Secondary | ICD-10-CM

## 2013-03-16 DIAGNOSIS — I77819 Aortic ectasia, unspecified site: Secondary | ICD-10-CM | POA: Insufficient documentation

## 2013-03-16 MED ORDER — PERFLUTREN PROTEIN A MICROSPH IV SUSP
3.0000 mL | Freq: Once | INTRAVENOUS | Status: AC
  Administered 2013-03-16: 3 mL via INTRAVENOUS

## 2013-03-16 NOTE — Progress Notes (Signed)
Echocardiogram performed with optison.

## 2013-03-23 DIAGNOSIS — D649 Anemia, unspecified: Secondary | ICD-10-CM | POA: Diagnosis not present

## 2013-03-23 DIAGNOSIS — E46 Unspecified protein-calorie malnutrition: Secondary | ICD-10-CM | POA: Diagnosis not present

## 2013-03-23 DIAGNOSIS — L03317 Cellulitis of buttock: Secondary | ICD-10-CM | POA: Diagnosis not present

## 2013-03-23 DIAGNOSIS — L0231 Cutaneous abscess of buttock: Secondary | ICD-10-CM | POA: Diagnosis not present

## 2013-03-23 DIAGNOSIS — I10 Essential (primary) hypertension: Secondary | ICD-10-CM | POA: Diagnosis not present

## 2013-03-23 DIAGNOSIS — I2510 Atherosclerotic heart disease of native coronary artery without angina pectoris: Secondary | ICD-10-CM | POA: Diagnosis not present

## 2013-03-23 DIAGNOSIS — E119 Type 2 diabetes mellitus without complications: Secondary | ICD-10-CM | POA: Diagnosis not present

## 2013-03-24 DIAGNOSIS — L8992 Pressure ulcer of unspecified site, stage 2: Secondary | ICD-10-CM | POA: Diagnosis not present

## 2013-03-24 DIAGNOSIS — L089 Local infection of the skin and subcutaneous tissue, unspecified: Secondary | ICD-10-CM | POA: Diagnosis not present

## 2013-03-24 DIAGNOSIS — L89309 Pressure ulcer of unspecified buttock, unspecified stage: Secondary | ICD-10-CM | POA: Diagnosis not present

## 2013-03-26 DIAGNOSIS — D649 Anemia, unspecified: Secondary | ICD-10-CM | POA: Diagnosis not present

## 2013-03-26 DIAGNOSIS — Z87440 Personal history of urinary (tract) infections: Secondary | ICD-10-CM | POA: Diagnosis not present

## 2013-03-26 DIAGNOSIS — E785 Hyperlipidemia, unspecified: Secondary | ICD-10-CM | POA: Diagnosis not present

## 2013-03-26 DIAGNOSIS — I2510 Atherosclerotic heart disease of native coronary artery without angina pectoris: Secondary | ICD-10-CM | POA: Diagnosis not present

## 2013-03-26 DIAGNOSIS — E119 Type 2 diabetes mellitus without complications: Secondary | ICD-10-CM | POA: Diagnosis not present

## 2013-03-26 DIAGNOSIS — I10 Essential (primary) hypertension: Secondary | ICD-10-CM | POA: Diagnosis not present

## 2013-03-26 DIAGNOSIS — L03317 Cellulitis of buttock: Secondary | ICD-10-CM | POA: Diagnosis not present

## 2013-03-26 DIAGNOSIS — L0231 Cutaneous abscess of buttock: Secondary | ICD-10-CM | POA: Diagnosis not present

## 2013-03-26 DIAGNOSIS — E46 Unspecified protein-calorie malnutrition: Secondary | ICD-10-CM | POA: Diagnosis not present

## 2013-03-30 DIAGNOSIS — E119 Type 2 diabetes mellitus without complications: Secondary | ICD-10-CM | POA: Diagnosis not present

## 2013-03-30 DIAGNOSIS — D649 Anemia, unspecified: Secondary | ICD-10-CM | POA: Diagnosis not present

## 2013-03-30 DIAGNOSIS — I2510 Atherosclerotic heart disease of native coronary artery without angina pectoris: Secondary | ICD-10-CM | POA: Diagnosis not present

## 2013-03-30 DIAGNOSIS — L0231 Cutaneous abscess of buttock: Secondary | ICD-10-CM | POA: Diagnosis not present

## 2013-03-30 DIAGNOSIS — I10 Essential (primary) hypertension: Secondary | ICD-10-CM | POA: Diagnosis not present

## 2013-03-30 DIAGNOSIS — E46 Unspecified protein-calorie malnutrition: Secondary | ICD-10-CM | POA: Diagnosis not present

## 2013-03-31 ENCOUNTER — Encounter (HOSPITAL_BASED_OUTPATIENT_CLINIC_OR_DEPARTMENT_OTHER): Payer: Medicare Other | Attending: Internal Medicine

## 2013-03-31 DIAGNOSIS — L89309 Pressure ulcer of unspecified buttock, unspecified stage: Secondary | ICD-10-CM | POA: Diagnosis not present

## 2013-03-31 DIAGNOSIS — L8992 Pressure ulcer of unspecified site, stage 2: Secondary | ICD-10-CM | POA: Diagnosis not present

## 2013-04-06 DIAGNOSIS — I10 Essential (primary) hypertension: Secondary | ICD-10-CM | POA: Diagnosis not present

## 2013-04-06 DIAGNOSIS — E119 Type 2 diabetes mellitus without complications: Secondary | ICD-10-CM | POA: Diagnosis not present

## 2013-04-06 DIAGNOSIS — I2510 Atherosclerotic heart disease of native coronary artery without angina pectoris: Secondary | ICD-10-CM | POA: Diagnosis not present

## 2013-04-06 DIAGNOSIS — L03317 Cellulitis of buttock: Secondary | ICD-10-CM | POA: Diagnosis not present

## 2013-04-06 DIAGNOSIS — L0231 Cutaneous abscess of buttock: Secondary | ICD-10-CM | POA: Diagnosis not present

## 2013-04-06 DIAGNOSIS — D649 Anemia, unspecified: Secondary | ICD-10-CM | POA: Diagnosis not present

## 2013-04-06 DIAGNOSIS — E46 Unspecified protein-calorie malnutrition: Secondary | ICD-10-CM | POA: Diagnosis not present

## 2013-04-07 DIAGNOSIS — L89309 Pressure ulcer of unspecified buttock, unspecified stage: Secondary | ICD-10-CM | POA: Diagnosis not present

## 2013-04-07 DIAGNOSIS — L8992 Pressure ulcer of unspecified site, stage 2: Secondary | ICD-10-CM | POA: Diagnosis not present

## 2013-04-11 DIAGNOSIS — I2510 Atherosclerotic heart disease of native coronary artery without angina pectoris: Secondary | ICD-10-CM | POA: Diagnosis not present

## 2013-04-11 DIAGNOSIS — I10 Essential (primary) hypertension: Secondary | ICD-10-CM | POA: Diagnosis not present

## 2013-04-11 DIAGNOSIS — E119 Type 2 diabetes mellitus without complications: Secondary | ICD-10-CM | POA: Diagnosis not present

## 2013-04-11 DIAGNOSIS — D649 Anemia, unspecified: Secondary | ICD-10-CM | POA: Diagnosis not present

## 2013-04-11 DIAGNOSIS — L0231 Cutaneous abscess of buttock: Secondary | ICD-10-CM | POA: Diagnosis not present

## 2013-04-11 DIAGNOSIS — L03317 Cellulitis of buttock: Secondary | ICD-10-CM | POA: Diagnosis not present

## 2013-04-11 DIAGNOSIS — E46 Unspecified protein-calorie malnutrition: Secondary | ICD-10-CM | POA: Diagnosis not present

## 2013-04-18 DIAGNOSIS — I10 Essential (primary) hypertension: Secondary | ICD-10-CM | POA: Diagnosis not present

## 2013-04-18 DIAGNOSIS — I2510 Atherosclerotic heart disease of native coronary artery without angina pectoris: Secondary | ICD-10-CM | POA: Diagnosis not present

## 2013-04-18 DIAGNOSIS — D649 Anemia, unspecified: Secondary | ICD-10-CM | POA: Diagnosis not present

## 2013-04-18 DIAGNOSIS — E119 Type 2 diabetes mellitus without complications: Secondary | ICD-10-CM | POA: Diagnosis not present

## 2013-04-18 DIAGNOSIS — L0231 Cutaneous abscess of buttock: Secondary | ICD-10-CM | POA: Diagnosis not present

## 2013-04-18 DIAGNOSIS — E46 Unspecified protein-calorie malnutrition: Secondary | ICD-10-CM | POA: Diagnosis not present

## 2013-04-26 ENCOUNTER — Other Ambulatory Visit: Payer: Self-pay | Admitting: Family Medicine

## 2013-04-27 DIAGNOSIS — I2510 Atherosclerotic heart disease of native coronary artery without angina pectoris: Secondary | ICD-10-CM | POA: Diagnosis not present

## 2013-04-27 DIAGNOSIS — L0231 Cutaneous abscess of buttock: Secondary | ICD-10-CM | POA: Diagnosis not present

## 2013-04-27 DIAGNOSIS — I10 Essential (primary) hypertension: Secondary | ICD-10-CM | POA: Diagnosis not present

## 2013-04-27 DIAGNOSIS — L03317 Cellulitis of buttock: Secondary | ICD-10-CM | POA: Diagnosis not present

## 2013-04-27 DIAGNOSIS — D649 Anemia, unspecified: Secondary | ICD-10-CM | POA: Diagnosis not present

## 2013-04-27 DIAGNOSIS — E46 Unspecified protein-calorie malnutrition: Secondary | ICD-10-CM | POA: Diagnosis not present

## 2013-04-27 DIAGNOSIS — E119 Type 2 diabetes mellitus without complications: Secondary | ICD-10-CM | POA: Diagnosis not present

## 2013-04-28 ENCOUNTER — Encounter (HOSPITAL_BASED_OUTPATIENT_CLINIC_OR_DEPARTMENT_OTHER): Payer: Medicare Other | Attending: Internal Medicine

## 2013-04-28 DIAGNOSIS — L89309 Pressure ulcer of unspecified buttock, unspecified stage: Secondary | ICD-10-CM | POA: Diagnosis not present

## 2013-04-28 DIAGNOSIS — L8992 Pressure ulcer of unspecified site, stage 2: Secondary | ICD-10-CM | POA: Diagnosis not present

## 2013-05-02 DIAGNOSIS — E46 Unspecified protein-calorie malnutrition: Secondary | ICD-10-CM | POA: Diagnosis not present

## 2013-05-02 DIAGNOSIS — I10 Essential (primary) hypertension: Secondary | ICD-10-CM | POA: Diagnosis not present

## 2013-05-02 DIAGNOSIS — L0231 Cutaneous abscess of buttock: Secondary | ICD-10-CM | POA: Diagnosis not present

## 2013-05-02 DIAGNOSIS — D649 Anemia, unspecified: Secondary | ICD-10-CM | POA: Diagnosis not present

## 2013-05-02 DIAGNOSIS — I2510 Atherosclerotic heart disease of native coronary artery without angina pectoris: Secondary | ICD-10-CM | POA: Diagnosis not present

## 2013-05-02 DIAGNOSIS — E119 Type 2 diabetes mellitus without complications: Secondary | ICD-10-CM | POA: Diagnosis not present

## 2013-05-03 ENCOUNTER — Other Ambulatory Visit: Payer: Self-pay | Admitting: Cardiology

## 2013-05-04 ENCOUNTER — Other Ambulatory Visit: Payer: Self-pay | Admitting: Family Medicine

## 2013-05-05 DIAGNOSIS — L8992 Pressure ulcer of unspecified site, stage 2: Secondary | ICD-10-CM | POA: Diagnosis not present

## 2013-05-05 DIAGNOSIS — L89309 Pressure ulcer of unspecified buttock, unspecified stage: Secondary | ICD-10-CM | POA: Diagnosis not present

## 2013-05-12 DIAGNOSIS — L8992 Pressure ulcer of unspecified site, stage 2: Secondary | ICD-10-CM | POA: Diagnosis not present

## 2013-05-12 DIAGNOSIS — L89309 Pressure ulcer of unspecified buttock, unspecified stage: Secondary | ICD-10-CM | POA: Diagnosis not present

## 2013-07-04 ENCOUNTER — Other Ambulatory Visit: Payer: Self-pay | Admitting: Family Medicine

## 2013-07-05 ENCOUNTER — Encounter: Payer: Self-pay | Admitting: Family Medicine

## 2013-07-05 ENCOUNTER — Ambulatory Visit (INDEPENDENT_AMBULATORY_CARE_PROVIDER_SITE_OTHER): Payer: Medicare Other | Admitting: Family Medicine

## 2013-07-05 VITALS — BP 124/58 | HR 69 | Temp 97.0°F | Ht 68.0 in | Wt 236.0 lb

## 2013-07-05 DIAGNOSIS — K5900 Constipation, unspecified: Secondary | ICD-10-CM | POA: Insufficient documentation

## 2013-07-05 DIAGNOSIS — E559 Vitamin D deficiency, unspecified: Secondary | ICD-10-CM

## 2013-07-05 DIAGNOSIS — E785 Hyperlipidemia, unspecified: Secondary | ICD-10-CM | POA: Diagnosis not present

## 2013-07-05 DIAGNOSIS — Q059 Spina bifida, unspecified: Secondary | ICD-10-CM

## 2013-07-05 DIAGNOSIS — E119 Type 2 diabetes mellitus without complications: Secondary | ICD-10-CM

## 2013-07-05 DIAGNOSIS — I2510 Atherosclerotic heart disease of native coronary artery without angina pectoris: Secondary | ICD-10-CM

## 2013-07-05 DIAGNOSIS — R300 Dysuria: Secondary | ICD-10-CM

## 2013-07-05 DIAGNOSIS — R32 Unspecified urinary incontinence: Secondary | ICD-10-CM

## 2013-07-05 DIAGNOSIS — I10 Essential (primary) hypertension: Secondary | ICD-10-CM | POA: Diagnosis not present

## 2013-07-05 LAB — POCT CBC
Granulocyte percent: 76.5 %G (ref 37–80)
HCT, POC: 42.5 % — AB (ref 43.5–53.7)
Hemoglobin: 14.1 g/dL (ref 14.1–18.1)
LYMPH, POC: 2.2 (ref 0.6–3.4)
MCH, POC: 29.8 pg (ref 27–31.2)
MCHC: 33.2 g/dL (ref 31.8–35.4)
MCV: 89.8 fL (ref 80–97)
MPV: 7.5 fL (ref 0–99.8)
POC Granulocyte: 8.3 — AB (ref 2–6.9)
POC LYMPH %: 20.4 % (ref 10–50)
Platelet Count, POC: 215 10*3/uL (ref 142–424)
RBC: 4.7 M/uL (ref 4.69–6.13)
RDW, POC: 14.7 %
WBC: 10.9 10*3/uL — AB (ref 4.6–10.2)

## 2013-07-05 LAB — POCT UA - MICROSCOPIC ONLY
Casts, Ur, LPF, POC: NEGATIVE
Crystals, Ur, HPF, POC: NEGATIVE
Mucus, UA: NEGATIVE
Yeast, UA: NEGATIVE

## 2013-07-05 LAB — POCT URINALYSIS DIPSTICK
Bilirubin, UA: NEGATIVE
GLUCOSE UA: NEGATIVE
Ketones, UA: NEGATIVE
Nitrite, UA: NEGATIVE
Spec Grav, UA: 1.02
UROBILINOGEN UA: NEGATIVE
pH, UA: 6

## 2013-07-05 MED ORDER — CIPROFLOXACIN HCL 250 MG PO TABS: 250.0000 mg | ORAL_TABLET | Freq: Two times a day (BID) | ORAL | Status: DC

## 2013-07-05 MED ORDER — NITROGLYCERIN 0.4 MG SL SUBL: 0.4000 mg | SUBLINGUAL_TABLET | SUBLINGUAL | Status: DC | PRN

## 2013-07-05 MED ORDER — CIPROFLOXACIN HCL 250 MG PO TABS
250.0000 mg | ORAL_TABLET | Freq: Two times a day (BID) | ORAL | Status: DC
Start: 2013-07-05 — End: 2013-07-05

## 2013-07-05 NOTE — Progress Notes (Signed)
Subjective:    Patient ID: [name], male    DOB: [birth year], 80 y.o.   MRN: [id number]  HPI Patient comes in today for his 4 month follow up of chronic medical conditions. The patient has had more discomfort with voiding for a couple weeks. He also continues to have some pain in his shoulders and knees. He is due a rectal exam on health maintenance and lab work. His chest x-ray is up-to-date. He is due to return an  FOBT. The patient's sacral pressure sores have healed. His wife continues to monitor his skin condition.    Review of Systems  Constitutional: Negative.   HENT: Negative.   Eyes: Negative.   Respiratory: Negative.   Cardiovascular: Negative.   Gastrointestinal: Negative.   Endocrine: Negative.   Genitourinary: Positive for dysuria and difficulty urinating (x 2 weeks). Hematuria: chronic.  Musculoskeletal: Positive for back pain, gait problem and joint swelling (arthritis in shoulders and knees). Arthralgias: uses walker.  Skin: Negative.   Allergic/Immunologic: Negative.   Neurological: Negative.   Hematological: Negative.   Psychiatric/Behavioral: Negative.        Objective:   Physical Exam  Nursing note and vitals reviewed. Constitutional: He is oriented to person, place, and time. He appears well-developed and well-nourished. No distress.  Pleasant smiling and cooperative and alert, patient is using a rolling walker  HENT:  Head: Normocephalic and atraumatic.  Right Ear: External ear normal.  Left Ear: External ear normal.  Nose: Nose normal.  Mouth/Throat: Oropharynx is clear and moist. No oropharyngeal exudate.  Eyes: Conjunctivae and EOM are normal. Pupils are equal, round, and reactive to light. Right eye exhibits no discharge. Left eye exhibits no discharge. No scleral icterus.  Neck: Normal range of motion. Neck supple. No thyromegaly present.  No carotid bruit  Cardiovascular: Normal rate, regular rhythm, normal heart sounds and intact distal  pulses.  Exam reveals no gallop and no friction rub.   No murmur heard. At 60 per minute  Pulmonary/Chest: Effort normal and breath sounds normal. No respiratory distress. He has no wheezes. He has no rales. He exhibits no tenderness.  Abdominal: Soft. Bowel sounds are normal. He exhibits no mass. There is no tenderness. There is no rebound and no guarding.  Genitourinary: Rectum normal and penis normal. No penile tenderness.  The prostate is slightly enlarged. There were no rectal masses. There were no prostate masses. There were no inguinal hernias. The external genitalia appeared normal. The glans was slightly red  Musculoskeletal: He exhibits no edema and no tenderness.  Diminished range of motion secondary to spina bifida. Patient wasn't able to stand without support  Lymphadenopathy:    He has no cervical adenopathy.  Neurological: He is alert and oriented to person, place, and time. No cranial nerve deficit.  Skin: Skin is warm and dry. No rash noted. No erythema. No pallor.  There is general redness and tougher skin on the buttocks without drainage or sores appear  Psychiatric: He has a normal mood and affect. His behavior is normal. Judgment and thought content normal.   Results for orders placed in visit on 07/05/13  POCT URINALYSIS DIPSTICK      Result Value Ref Range   Color, UA gold     Clarity, UA clear     Glucose, UA neg     Bilirubin, UA neg     Ketones, UA neg     Spec Grav, UA 1.020     Blood, UA trace  pH, UA 6.0     Protein, UA trace     Urobilinogen, UA negative     Nitrite, UA neg     Leukocytes, UA moderate (2+)    POCT UA - MICROSCOPIC ONLY      Result Value Ref Range   WBC, Ur, HPF, POC 20-40     RBC, urine, microscopic 3-5     Bacteria, U Microscopic occ     Mucus, UA neg     Epithelial cells, urine per micros occ     Crystals, Ur, HPF, POC neg     Casts, Ur, LPF, POC neg     Yeast, UA neg            Assessment & Plan:  1. Dysuria - POCT  urinalysis dipstick - POCT UA - Microscopic Only - Urine culture  2. Diabetes mellitus, type 2  3. Vitamin D deficiency - Vit D  25 hydroxy (rtn osteoporosis monitoring)  4. Hyperlipemia - NMR, lipoprofile - Hepatic function panel  5. Hypertension - POCT CBC - BMP8+EGFR  6. Coronary atherosclerosis of native coronary artery - nitroGLYCERIN (NITROSTAT) 0.4 MG SL tablet; Place 1 tablet (0.4 mg total) under the tongue every 5 (five) minutes as needed.  Dispense: 25 tablet; Refill: 6  7. Constipation  8. Spina bifida  9. Incontinence of urine  10. UTI   Meds ordered this encounter  Medications  . nitroGLYCERIN (NITROSTAT) 0.4 MG SL tablet    Sig: Place 1 tablet (0.4 mg total) under the tongue every 5 (five) minutes as needed.    Dispense:  25 tablet    Refill:  6  . ciprofloxacin (CIPRO) 250 MG tablet    Sig: Take 1 tablet (250 mg total) by mouth 2 (two) times daily.    Dispense:  20 tablet    Refill:  0     Patient Instructions  Continue current medications. Continue good therapeutic lifestyle changes which include good diet and exercise. Fall precautions discussed with patient. If an FOBT was given today- please return it to our front desk. If you are over 47 years old - you may need Prevnar 36 or the adult Pneumonia vaccine.   Continue to check blood sugars regularly Make sure that your wife monitors your skin regularly Drink plenty of fluids Monitor foot condition  Daily Return the FOBT    Arrie Senate MD

## 2013-07-05 NOTE — Patient Instructions (Addendum)
Continue current medications. Continue good therapeutic lifestyle changes which include good diet and exercise. Fall precautions discussed with patient. If an FOBT was given today- please return it to our front desk. If you are over 78 years old - you may need Prevnar 8 or the adult Pneumonia vaccine.   Continue to check blood sugars regularly Make sure that your wife monitors your skin regularly Drink plenty of fluids Monitor foot condition  Daily Return the FOBT

## 2013-07-06 ENCOUNTER — Other Ambulatory Visit: Payer: Self-pay | Admitting: Family Medicine

## 2013-07-06 ENCOUNTER — Telehealth: Payer: Self-pay | Admitting: *Deleted

## 2013-07-06 ENCOUNTER — Telehealth: Payer: Self-pay | Admitting: Family Medicine

## 2013-07-06 DIAGNOSIS — I2510 Atherosclerotic heart disease of native coronary artery without angina pectoris: Secondary | ICD-10-CM

## 2013-07-06 LAB — NMR, LIPOPROFILE
CHOLESTEROL: 136 mg/dL (ref 100–199)
HDL Cholesterol by NMR: 50 mg/dL (ref >39)
HDL Particle Number: 37.2 umol/L (ref >=30.5)
LDL Particle Number: 1001 nmol/L — ABNORMAL HIGH (ref <1000)
LDL Size: 20.4 nm (ref >20.5)
LDLC SERPL CALC-MCNC: 66 mg/dL (ref 0–99)
LP-IR Score: 30 (ref <=45)
SMALL LDL PARTICLE NUMBER: 574 nmol/L — AB (ref <=527)
TRIGLYCERIDES BY NMR: 98 mg/dL (ref 0–149)

## 2013-07-06 LAB — HEPATIC FUNCTION PANEL
ALK PHOS: 86 IU/L (ref 39–117)
ALT: 12 IU/L (ref 0–44)
AST: 18 IU/L (ref 0–40)
Albumin: 4.1 g/dL (ref 3.5–4.7)
Bilirubin, Direct: 0.18 mg/dL (ref 0.00–0.40)
Total Bilirubin: 0.6 mg/dL (ref 0.0–1.2)
Total Protein: 6.5 g/dL (ref 6.0–8.5)

## 2013-07-06 LAB — VITAMIN D 25 HYDROXY (VIT D DEFICIENCY, FRACTURES): VIT D 25 HYDROXY: 44.2 ng/mL (ref 30.0–100.0)

## 2013-07-06 LAB — URINE CULTURE: Organism ID, Bacteria: NO GROWTH

## 2013-07-06 LAB — BMP8+EGFR
BUN / CREAT RATIO: 17 (ref 10–22)
BUN: 27 mg/dL (ref 8–27)
CALCIUM: 9.8 mg/dL (ref 8.6–10.2)
CO2: 23 mmol/L (ref 18–29)
Chloride: 98 mmol/L (ref 97–108)
Creatinine, Ser: 1.61 mg/dL — ABNORMAL HIGH (ref 0.76–1.27)
GFR, EST AFRICAN AMERICAN: 46 mL/min/{1.73_m2} — AB (ref >59)
GFR, EST NON AFRICAN AMERICAN: 40 mL/min/{1.73_m2} — AB (ref >59)
Glucose: 173 mg/dL — ABNORMAL HIGH (ref 65–99)
POTASSIUM: 5.9 mmol/L — AB (ref 3.5–5.2)
SODIUM: 138 mmol/L (ref 134–144)

## 2013-07-06 MED ORDER — DOXYCYCLINE HYCLATE 100 MG PO TABS: 100.0000 mg | ORAL_TABLET | Freq: Every day | ORAL | Status: DC

## 2013-07-06 MED ORDER — NITROGLYCERIN 0.4 MG SL SUBL: 0.4000 mg | SUBLINGUAL_TABLET | SUBLINGUAL | Status: DC | PRN

## 2013-07-06 NOTE — Telephone Encounter (Signed)
Patient is already

## 2013-07-06 NOTE — Telephone Encounter (Signed)
I did not see the doxycycline on his list of medication. This may be refilled as needed once he finishes the Cipro.

## 2013-07-06 NOTE — Telephone Encounter (Signed)
I spoke with patient and he is aware to start the doxycycline back after he finishes the cipro. And rx sent to pharmacy

## 2013-07-06 NOTE — Telephone Encounter (Signed)
When he finishes the prescription for Cipro he should go back on the doxycycline. I will make sure this is refillable.

## 2013-07-06 NOTE — Telephone Encounter (Signed)
Patient wants to know once he finishes this round of cipro does he need to go back on his daily regimen of Doxycycline and if he does we need to send him in a rx please

## 2013-07-07 ENCOUNTER — Other Ambulatory Visit (INDEPENDENT_AMBULATORY_CARE_PROVIDER_SITE_OTHER): Payer: Medicare Other

## 2013-07-07 DIAGNOSIS — R7989 Other specified abnormal findings of blood chemistry: Secondary | ICD-10-CM | POA: Diagnosis not present

## 2013-07-07 NOTE — Progress Notes (Signed)
Pt came in for lab  only 

## 2013-07-08 ENCOUNTER — Other Ambulatory Visit: Payer: Self-pay | Admitting: Family Medicine

## 2013-07-08 LAB — BMP8+EGFR
BUN / CREAT RATIO: 15 (ref 10–22)
BUN: 27 mg/dL (ref 8–27)
CHLORIDE: 99 mmol/L (ref 97–108)
CO2: 23 mmol/L (ref 18–29)
CREATININE: 1.79 mg/dL — AB (ref 0.76–1.27)
Calcium: 9 mg/dL (ref 8.6–10.2)
GFR calc Af Amer: 40 mL/min/{1.73_m2} — ABNORMAL LOW (ref >59)
GFR calc non Af Amer: 35 mL/min/{1.73_m2} — ABNORMAL LOW (ref >59)
GLUCOSE: 142 mg/dL — AB (ref 65–99)
Potassium: 5.1 mmol/L (ref 3.5–5.2)
Sodium: 137 mmol/L (ref 134–144)

## 2013-07-12 ENCOUNTER — Telehealth: Payer: Self-pay | Admitting: Family Medicine

## 2013-07-12 DIAGNOSIS — R748 Abnormal levels of other serum enzymes: Secondary | ICD-10-CM

## 2013-07-12 NOTE — Telephone Encounter (Signed)
Patient aware of labs.  

## 2013-07-14 ENCOUNTER — Other Ambulatory Visit: Payer: Self-pay | Admitting: Cardiology

## 2013-07-19 ENCOUNTER — Other Ambulatory Visit (INDEPENDENT_AMBULATORY_CARE_PROVIDER_SITE_OTHER): Payer: Medicare Other

## 2013-07-19 ENCOUNTER — Other Ambulatory Visit: Payer: Self-pay

## 2013-07-19 DIAGNOSIS — N390 Urinary tract infection, site not specified: Secondary | ICD-10-CM | POA: Diagnosis not present

## 2013-07-19 LAB — POCT UA - MICROSCOPIC ONLY
BACTERIA, U MICROSCOPIC: NEGATIVE
CRYSTALS, UR, HPF, POC: NEGATIVE
Casts, Ur, LPF, POC: NEGATIVE
Mucus, UA: NEGATIVE
Yeast, UA: NEGATIVE

## 2013-07-19 LAB — POCT URINALYSIS DIPSTICK
BILIRUBIN UA: NEGATIVE
Glucose, UA: NEGATIVE
KETONES UA: NEGATIVE
Nitrite, UA: NEGATIVE
PROTEIN UA: NEGATIVE
SPEC GRAV UA: 1.015
Urobilinogen, UA: NEGATIVE
pH, UA: 5

## 2013-07-19 MED ORDER — GLIPIZIDE ER 10 MG PO TB24: ORAL_TABLET | ORAL | Status: DC

## 2013-07-19 NOTE — Progress Notes (Signed)
Pt dropped off urine only

## 2013-07-19 NOTE — Addendum Note (Signed)
Addended by: Earlene Plater on: 07/19/2013 12:12 PM   Modules accepted: Orders

## 2013-07-20 ENCOUNTER — Other Ambulatory Visit: Payer: Self-pay | Admitting: Family Medicine

## 2013-07-20 DIAGNOSIS — R7989 Other specified abnormal findings of blood chemistry: Secondary | ICD-10-CM

## 2013-07-20 DIAGNOSIS — R748 Abnormal levels of other serum enzymes: Secondary | ICD-10-CM

## 2013-07-21 ENCOUNTER — Other Ambulatory Visit: Payer: Self-pay | Admitting: Family Medicine

## 2013-07-25 LAB — URINE CULTURE

## 2013-08-08 ENCOUNTER — Telehealth: Payer: Self-pay | Admitting: Family Medicine

## 2013-08-10 ENCOUNTER — Other Ambulatory Visit: Payer: Medicare Other

## 2013-08-10 DIAGNOSIS — N390 Urinary tract infection, site not specified: Secondary | ICD-10-CM

## 2013-08-10 NOTE — Addendum Note (Signed)
Addended by: Pollyann Kennedy F on: 08/10/2013 05:44 PM   Modules accepted: Orders

## 2013-08-11 ENCOUNTER — Encounter: Payer: Self-pay | Admitting: *Deleted

## 2013-08-11 LAB — URINE CULTURE

## 2013-08-15 ENCOUNTER — Telehealth: Payer: Self-pay | Admitting: Family Medicine

## 2013-08-15 DIAGNOSIS — H2640 Unspecified secondary cataract: Secondary | ICD-10-CM | POA: Diagnosis not present

## 2013-08-15 DIAGNOSIS — H3530 Unspecified macular degeneration: Secondary | ICD-10-CM | POA: Diagnosis not present

## 2013-08-15 DIAGNOSIS — H5010 Unspecified exotropia: Secondary | ICD-10-CM | POA: Diagnosis not present

## 2013-08-15 DIAGNOSIS — E119 Type 2 diabetes mellitus without complications: Secondary | ICD-10-CM | POA: Diagnosis not present

## 2013-08-15 NOTE — Telephone Encounter (Signed)
Pt is ware of urine results.  He says it burns when he urinates.  He is taking doxycycline 50mg .  Should he continue to take doxycycline or does [facility] want a different med.  He uses [facility] in [place].  Call pt to let him [phone]

## 2013-08-15 NOTE — Telephone Encounter (Signed)
Message copied by Waverly Ferrari on Mon Aug 15, 2013  9:35 AM ------      Message from: Chipper Herb      Created: Thu Aug 11, 2013 10:55 PM       Mixed bacterial flora, no specific bacteria to treat. How is patient feeling? Any symptoms? ------

## 2013-08-15 NOTE — Telephone Encounter (Signed)
Continue doxycycline until finished

## 2013-08-16 NOTE — Telephone Encounter (Signed)
Patient aware.

## 2013-09-14 DIAGNOSIS — N183 Chronic kidney disease, stage 3 unspecified: Secondary | ICD-10-CM | POA: Diagnosis not present

## 2013-09-14 DIAGNOSIS — E1129 Type 2 diabetes mellitus with other diabetic kidney complication: Secondary | ICD-10-CM | POA: Diagnosis not present

## 2013-09-14 DIAGNOSIS — I10 Essential (primary) hypertension: Secondary | ICD-10-CM | POA: Diagnosis not present

## 2013-09-16 ENCOUNTER — Telehealth: Payer: Self-pay | Admitting: Family Medicine

## 2013-09-20 NOTE — Telephone Encounter (Signed)
Pt aware that no referral is needed

## 2013-09-22 ENCOUNTER — Other Ambulatory Visit (HOSPITAL_COMMUNITY): Payer: Self-pay | Admitting: Nephrology

## 2013-09-22 ENCOUNTER — Encounter: Payer: Self-pay | Admitting: Cardiology

## 2013-09-22 ENCOUNTER — Ambulatory Visit (INDEPENDENT_AMBULATORY_CARE_PROVIDER_SITE_OTHER): Payer: Medicare Other | Admitting: Cardiology

## 2013-09-22 VITALS — BP 148/84 | HR 96 | Ht 68.0 in | Wt 247.1 lb

## 2013-09-22 DIAGNOSIS — N289 Disorder of kidney and ureter, unspecified: Secondary | ICD-10-CM

## 2013-09-22 DIAGNOSIS — R0602 Shortness of breath: Secondary | ICD-10-CM

## 2013-09-22 DIAGNOSIS — I10 Essential (primary) hypertension: Secondary | ICD-10-CM | POA: Diagnosis not present

## 2013-09-22 DIAGNOSIS — I2510 Atherosclerotic heart disease of native coronary artery without angina pectoris: Secondary | ICD-10-CM

## 2013-09-22 DIAGNOSIS — N401 Enlarged prostate with lower urinary tract symptoms: Secondary | ICD-10-CM | POA: Diagnosis not present

## 2013-09-22 DIAGNOSIS — N189 Chronic kidney disease, unspecified: Secondary | ICD-10-CM | POA: Diagnosis not present

## 2013-09-22 DIAGNOSIS — I5032 Chronic diastolic (congestive) heart failure: Secondary | ICD-10-CM | POA: Insufficient documentation

## 2013-09-22 DIAGNOSIS — Z91199 Patient's noncompliance with other medical treatment and regimen due to unspecified reason: Secondary | ICD-10-CM | POA: Diagnosis not present

## 2013-09-22 DIAGNOSIS — I509 Heart failure, unspecified: Secondary | ICD-10-CM

## 2013-09-22 DIAGNOSIS — R82998 Other abnormal findings in urine: Secondary | ICD-10-CM | POA: Diagnosis not present

## 2013-09-22 DIAGNOSIS — Z9119 Patient's noncompliance with other medical treatment and regimen: Secondary | ICD-10-CM | POA: Diagnosis not present

## 2013-09-22 MED ORDER — FUROSEMIDE 20 MG PO TABS: 20.0000 mg | ORAL_TABLET | Freq: Every day | ORAL | Status: DC

## 2013-09-22 NOTE — Patient Instructions (Signed)
Your physician recommends that you schedule a follow-up appointment in: 2 months with Dr Aundra Dubin  Your physician has recommended you make the following change in your medication:  1) Start Furosemide 20mg  daily  Your physician recommends that you have labs today: BMP/BNP and again on 09/29/13 at Dr Tawanna Sat office--will give order

## 2013-09-22 NOTE — Progress Notes (Signed)
Patient ID: [name], male   DOB: [birth year], 80 y.o.   MRN: [id number] PCP: Dr. Laurance Flatten  80 yo with history of CAD s/p CABG presents for cardiology followup.  His main problem in the last couple of years has been gluteal pressure ulcers.  These became infected and he has had extensive treatment with antibiotics and wound care.  He was very inactive during this period. They have finally healed. He is somewhat unsteady walking and uses a walker.  No chest pain.  He is short of breath after walking about 80-100 feet, this seems a bit worse than at last appointment.  No orthopnea or PND.  Echo was done in 1/15 and showed EF 50-55% with normal RV size and mildly decreased RV systolic function.  Patient's creatinine increased recently and valsartan was stopped.  Weight is up 8 lbs.   ECG: NSR, left axis deviation, diffuse T wave flattening, poor anterior R wave progression  Labs (9/14): LDL particle number [id number], LDL 74 Labs (11/14): K 4.7,creatinine 1.26 Labs (5/15): K 5.1, creatinine 1.79, LDL 66, LDL-P 1001  PMH: 1. Mild spina bifida 2. Type II diabetes 3. Hyperlipidemia 4. HTN 5. CKD 6. Anemia 7. PTSD 8. CAD: CABG 7/98 (Dr. Redmond Pulling).  Last Myoview in 2/07 showed EF 63%, no ischemia.  9. Gluteal decubitus ulcers 10. Obesity 11. Carotid stenosis: Carotid dopplers (5/13) with 40-59% bilateral ICA stenosis.  Carotid dopplers (1/15) with 40-59% bilateral ICA stenosis. 12. Diastolic CHF: Echo (0000000) with EF 50-55%, normal RV size with mildly decreased systolic function.   SH: Married, lives in [place], retired from [employer], prior smoker.   FH: CAD  ROS: All systems reviewed and negative except as per HPI.   Current Outpatient Prescriptions  Medication Sig Dispense Refill  . aspirin 81 MG tablet Take 81 mg by mouth daily.        Marland Kitchen buPROPion (WELLBUTRIN XL) 300 MG 24 hr tablet Take 300 mg by mouth every morning.        . Cholecalciferol (VITAMIN D-3) 5000 UNITS TABS Take  1 tablet by mouth daily.        . Coenzyme Q10 (CO Q-10) 100 MG CAPS Take 1 capsule by mouth 2 (two) times daily.       Marland Kitchen doxycycline (VIBRA-TABS) 100 MG tablet Take 100 mg by mouth daily. On hold as of 09/22/13      . glipiZIDE (GLIPIZIDE XL) 10 MG 24 hr tablet TAKE 1 TABLET DAILY  90 tablet  0  . metoprolol tartrate (LOPRESSOR) 25 MG tablet Take 25 mg by mouth 2 (two) times daily.      . Multiple Vitamins-Minerals (PRESERVISION AREDS 2 PO) Take 1 capsule by mouth 2 (two) times daily.      . nitroGLYCERIN (NITROSTAT) 0.4 MG SL tablet Place 1 tablet (0.4 mg total) under the tongue every 5 (five) minutes as needed.  25 tablet  6  . NON FORMULARY cran-actin 100mg  1 tablet daily      . Omega-3 Fatty Acids (FISH OIL) 1000 MG CAPS Take 1 capsule by mouth 2 (two) times daily.        . polyethylene glycol powder (GLYCOLAX/MIRALAX) powder Take 17 g by mouth 2 (two) times daily as needed.  3350 g  1  . VYTORIN 10-20 MG per tablet TAKE 1 TABLET DAILY AS DIRECTED  90 tablet  1  . furosemide (LASIX) 20 MG tablet Take 1 tablet (20 mg total) by mouth daily.  90 tablet  3   No current facility-administered medications for this visit.    BP 148/84  Pulse 96  Ht 5\' 8"  (1.727 m)  Wt 247 lb 1.9 oz (112.093 kg)  BMI 37.58 kg/m2 General: NAD Neck: JVP 8-9 cm, no thyromegaly or thyroid nodule.  Lungs: Mild crackles at bases bilaterally. CV: Nondisplaced PMI.  Heart regular S1/S2, no S3/S4, no murmur.  No peripheral edema.  No carotid bruit.  Normal pedal pulses.  Abdomen: Soft, nontender, no hepatosplenomegaly, no distention.  Neurologic: Alert and oriented x 3.  Psych: Normal affect. Extremities: No clubbing or cyanosis.   Assessment/Plan: 1. CAD: s/p CABG in 1998.  He has not had cardiac cath since that time.  Last Myoview was in 2007 and was nonischemic. No chest pain.   - Continue ASA 81, statin, metoprolol.  2. Carotid stenosis: Stable, repeat dopplers 1/17.   3. Hyperlipidemia: Followed by Dr.  Laurance Flatten.  LDL particle number improved in 5/15.  Continue current statin. 4. HTN: BP mildly elevated off valsartan.  Would observe for now.  5. Chronic diastolic CHF: EF [id number] on echo.  Weight is up and he looks a bit volume overloaded on exam.  Progressive dyspnea, NYHA class II-III.  Suspect deconditioning plays a role in dyspnea as well.  I will start him on Lasix 20 mg daily with BMET/BNP today and again in 2 wks.  6. CKD: Now off valsartan.  Follow creatinine closely.    Loralie Champagne 09/22/2013

## 2013-09-23 LAB — BASIC METABOLIC PANEL
BUN: 24 mg/dL — AB (ref 6–23)
CHLORIDE: 103 meq/L (ref 96–112)
CO2: 24 mEq/L (ref 19–32)
Calcium: 9.2 mg/dL (ref 8.4–10.5)
Creatinine, Ser: 1.5 mg/dL (ref 0.4–1.5)
GFR: 47.39 mL/min — ABNORMAL LOW (ref >60.00)
GLUCOSE: 249 mg/dL — AB (ref 70–99)
POTASSIUM: 4.9 meq/L (ref 3.5–5.1)
Sodium: 136 mEq/L (ref 135–145)

## 2013-09-23 LAB — BRAIN NATRIURETIC PEPTIDE: Pro B Natriuretic peptide (BNP): 45 pg/mL (ref 0.0–100.0)

## 2013-09-24 ENCOUNTER — Other Ambulatory Visit: Payer: Self-pay | Admitting: Cardiology

## 2013-09-26 ENCOUNTER — Telehealth: Payer: Self-pay | Admitting: Cardiology

## 2013-09-26 DIAGNOSIS — R0602 Shortness of breath: Secondary | ICD-10-CM

## 2013-09-26 MED ORDER — FUROSEMIDE 20 MG PO TABS: 20.0000 mg | ORAL_TABLET | Freq: Every day | ORAL | Status: DC

## 2013-09-26 NOTE — Telephone Encounter (Signed)
Follow Up    Following up on new prescription of Lasix sent over [facility] in [place]. Please call.

## 2013-09-26 NOTE — Telephone Encounter (Signed)
Pt advised prescription for lasix has been sent to [facility]

## 2013-09-29 ENCOUNTER — Other Ambulatory Visit (INDEPENDENT_AMBULATORY_CARE_PROVIDER_SITE_OTHER): Payer: Medicare Other

## 2013-09-29 ENCOUNTER — Other Ambulatory Visit: Payer: Self-pay | Admitting: Family Medicine

## 2013-09-29 DIAGNOSIS — I10 Essential (primary) hypertension: Secondary | ICD-10-CM | POA: Diagnosis not present

## 2013-09-29 DIAGNOSIS — R0602 Shortness of breath: Secondary | ICD-10-CM

## 2013-09-29 NOTE — Progress Notes (Signed)
Pt came in for labs ordered by dr. Aundra Dubin bmp and bnp dx 786.05, 401.9

## 2013-09-30 LAB — BMP8+EGFR
BUN/Creatinine Ratio: 11 (ref 10–22)
BUN: 17 mg/dL (ref 8–27)
CO2: 22 mmol/L (ref 18–29)
Calcium: 9.4 mg/dL (ref 8.6–10.2)
Chloride: 98 mmol/L (ref 97–108)
Creatinine, Ser: 1.52 mg/dL — ABNORMAL HIGH (ref 0.76–1.27)
GFR calc Af Amer: 49 mL/min/{1.73_m2} — ABNORMAL LOW (ref >59)
GFR calc non Af Amer: 43 mL/min/{1.73_m2} — ABNORMAL LOW (ref >59)
GLUCOSE: 183 mg/dL — AB (ref 65–99)
POTASSIUM: 5.2 mmol/L (ref 3.5–5.2)
Sodium: 138 mmol/L (ref 134–144)

## 2013-09-30 LAB — BRAIN NATRIURETIC PEPTIDE: BNP: 45.4 pg/mL (ref 0.0–100.0)

## 2013-10-04 ENCOUNTER — Encounter: Payer: Self-pay | Admitting: Family Medicine

## 2013-10-04 ENCOUNTER — Ambulatory Visit (INDEPENDENT_AMBULATORY_CARE_PROVIDER_SITE_OTHER): Payer: Medicare Other | Admitting: Family Medicine

## 2013-10-04 VITALS — BP 142/71 | HR 68 | Temp 97.2°F | Ht 68.0 in | Wt 247.0 lb

## 2013-10-04 DIAGNOSIS — E559 Vitamin D deficiency, unspecified: Secondary | ICD-10-CM | POA: Diagnosis not present

## 2013-10-04 DIAGNOSIS — N058 Unspecified nephritic syndrome with other morphologic changes: Secondary | ICD-10-CM

## 2013-10-04 DIAGNOSIS — I10 Essential (primary) hypertension: Secondary | ICD-10-CM

## 2013-10-04 DIAGNOSIS — D649 Anemia, unspecified: Secondary | ICD-10-CM | POA: Diagnosis not present

## 2013-10-04 DIAGNOSIS — E1129 Type 2 diabetes mellitus with other diabetic kidney complication: Secondary | ICD-10-CM

## 2013-10-04 DIAGNOSIS — E785 Hyperlipidemia, unspecified: Secondary | ICD-10-CM | POA: Diagnosis not present

## 2013-10-04 DIAGNOSIS — R350 Frequency of micturition: Secondary | ICD-10-CM | POA: Diagnosis not present

## 2013-10-04 DIAGNOSIS — I2510 Atherosclerotic heart disease of native coronary artery without angina pectoris: Secondary | ICD-10-CM

## 2013-10-04 DIAGNOSIS — E8881 Metabolic syndrome: Secondary | ICD-10-CM

## 2013-10-04 DIAGNOSIS — E1121 Type 2 diabetes mellitus with diabetic nephropathy: Secondary | ICD-10-CM

## 2013-10-04 LAB — POCT URINALYSIS DIPSTICK
Bilirubin, UA: NEGATIVE
Glucose, UA: 250
Ketones, UA: NEGATIVE
Nitrite, UA: NEGATIVE
Protein, UA: NEGATIVE
Spec Grav, UA: 1.02
Urobilinogen, UA: NEGATIVE
pH, UA: 5

## 2013-10-04 LAB — POCT CBC
Granulocyte percent: 77.1 % (ref 37–80)
HCT, POC: 45.1 % (ref 43.5–53.7)
Hemoglobin: 15.3 g/dL (ref 14.1–18.1)
Lymph, poc: 2.1 (ref 0.6–3.4)
MCH, POC: 30.5 pg (ref 27–31.2)
MCHC: 34 g/dL (ref 31.8–35.4)
MCV: 89.8 fL (ref 80–97)
MPV: 7.4 fL (ref 0–99.8)
POC Granulocyte: 8.3 — AB (ref 2–6.9)
POC LYMPH PERCENT: 19.6 % (ref 10–50)
Platelet Count, POC: 204 K/uL (ref 142–424)
RBC: 5 M/uL (ref 4.69–6.13)
RDW, POC: 13.6 %
WBC: 10.8 K/uL — AB (ref 4.6–10.2)

## 2013-10-04 LAB — POCT UA - MICROSCOPIC ONLY
Casts, Ur, LPF, POC: NEGATIVE
Crystals, Ur, HPF, POC: NEGATIVE
Mucus, UA: NEGATIVE
Yeast, UA: NEGATIVE

## 2013-10-04 LAB — POCT GLYCOSYLATED HEMOGLOBIN (HGB A1C)
Hemoglobin A1C: 8.5
Hemoglobin A1C: 8.5

## 2013-10-04 NOTE — Progress Notes (Signed)
Subjective:    Patient ID: [name], male    DOB: [birth year], 80 y.o.   MRN: [id number]  HPI Pt here for follow up and management of chronic medical problems. The patient had recent blood work done by the cardiologist. These results are in the record. His blood sugar was 183. His creatinine was 1.5 today. His potassium was within normal limits. A BNP was also done and this was within normal limits at 45.4       Patient Active Problem List   Diagnosis Date Noted  . Chronic diastolic CHF (congestive heart failure) 09/22/2013  . Constipation 07/05/2013  . Cellulitis 01/19/2013  . Abscess or cellulitis of gluteal region 01/19/2013  . UTI (lower urinary tract infection) 01/19/2013  . Urinary incontinence 12/13/2012  . External hemorrhoids with complication 26/37/8588  . Chronic constipation 11/29/2012  . Metabolic syndrome 50/27/7412  . Diabetes mellitus, type 2 11/29/2012  . Hemorrhoid prolapse 11/29/2012  . Spina bifida 11/18/2012  . CAD, NATIVE VESSEL 03/08/2009  . Type 2 diabetes mellitus with diabetic nephropathy 10/29/2008  . MALNUTRITION, PROTEIN-CALORIE 10/29/2008  . HYPERLIPIDEMIA 10/29/2008  . HYPOKALEMIA 10/29/2008  . ANEMIA 10/29/2008  . PTSD 10/29/2008  . HYPERTENSION, UNSPECIFIED 10/29/2008  . RENAL INSUFFICIENCY, CHRONIC 10/29/2008   Outpatient Encounter Prescriptions as of 10/04/2013  Medication Sig  . aspirin 81 MG tablet Take 81 mg by mouth daily.    Marland Kitchen buPROPion (WELLBUTRIN XL) 300 MG 24 hr tablet Take 300 mg by mouth every morning.    . Cholecalciferol (VITAMIN D-3) 5000 UNITS TABS Take 1 tablet by mouth daily.    . Coenzyme Q10 (CO Q-10) 100 MG CAPS Take 1 capsule by mouth 2 (two) times daily.   . furosemide (LASIX) 20 MG tablet Take 1 tablet (20 mg total) by mouth daily.  Marland Kitchen GLIPIZIDE XL 10 MG 24 hr tablet TAKE 1 TABLET DAILY  . metoprolol tartrate (LOPRESSOR) 25 MG tablet TAKE 1 TABLET TWICE A DAY  . Multiple Vitamins-Minerals (PRESERVISION AREDS 2  PO) Take 1 capsule by mouth 2 (two) times daily.  . nitroGLYCERIN (NITROSTAT) 0.4 MG SL tablet Place 1 tablet (0.4 mg total) under the tongue every 5 (five) minutes as needed.  . NON FORMULARY cran-actin 162m 1 tablet daily  . Omega-3 Fatty Acids (FISH OIL) 1000 MG CAPS Take 1 capsule by mouth 2 (two) times daily.    . polyethylene glycol powder (GLYCOLAX/MIRALAX) powder Take 17 g by mouth 2 (two) times daily as needed.  .Marland KitchenVYTORIN 10-20 MG per tablet TAKE 1 TABLET DAILY AS DIRECTED  . [DISCONTINUED] doxycycline (VIBRA-TABS) 100 MG tablet Take 100 mg by mouth daily. On hold as of 09/22/13    Review of Systems  Constitutional: Negative.   HENT: Negative.   Eyes: Negative.   Respiratory: Negative.   Cardiovascular: Negative.   Gastrointestinal: Negative.   Endocrine: Negative.   Genitourinary: Positive for frequency (recently started on Lasix).  Musculoskeletal: Negative.   Skin: Negative.   Allergic/Immunologic: Negative.   Neurological: Negative.   Hematological: Negative.   Psychiatric/Behavioral: Negative.        Objective:   Physical Exam  Nursing note and vitals reviewed. Constitutional: He is oriented to person, place, and time. He appears well-developed and well-nourished. No distress.  HENT:  Head: Normocephalic and atraumatic.  Right Ear: External ear normal.  Left Ear: External ear normal.  Nose: Nose normal.  Mouth/Throat: Oropharynx is clear and moist. No oropharyngeal exudate.  Eyes: Conjunctivae and EOM are normal. Pupils  are equal, round, and reactive to light. Right eye exhibits no discharge. Left eye exhibits no discharge. No scleral icterus.  Neck: Normal range of motion. Neck supple. No tracheal deviation present. No thyromegaly present.  Cardiovascular: Normal rate and regular rhythm.  Exam reveals no gallop.   No murmur heard. At 84 per minute, the feet were warm but distal pulses were difficult to palpate  Pulmonary/Chest: Effort normal and breath sounds  normal. No respiratory distress. He has no wheezes. He has no rales. He exhibits no tenderness.  Abdominal: Soft. Bowel sounds are normal. He exhibits no mass. There is no tenderness. There is no rebound and no guarding.  Obesity present but no masses organomegaly or tenderness  Genitourinary:  The patient recently saw the urologist and had a cystoscopy.  Musculoskeletal: Normal range of motion. He exhibits no edema and no tenderness.  Lymphadenopathy:    He has no cervical adenopathy.  Neurological: He is alert and oriented to person, place, and time. No cranial nerve deficit.  Skin: Skin is warm and dry. No rash noted. No erythema. No pallor.  Psychiatric: He has a normal mood and affect. His behavior is normal. Judgment and thought content normal.   BP 142/71  Pulse 68  Temp(Src) 97.2 F (36.2 C) (Oral)  Ht _0  (1.727 m)  Wt 247 lb (112.038 kg)  BMI 37.56 kg/m2        Assessment & Plan:  1. ANEMIA - POCT CBC - POCT CBC  2. Atherosclerosis of native coronary artery of native heart without angina pectoris - POCT CBC - POCT CBC  3. HYPERLIPIDEMIA - NMR, lipoprofile; Future - POCT CBC - Lipid panel - POCT CBC - NMR, lipoprofile  4. HYPERTENSION, UNSPECIFIED - BMP8+EGFR; Future - Hepatic function panel; Future - POCT CBC - BMP8+EGFR - Hepatic function panel - POCT CBC - BMP8+EGFR - Hepatic function panel  5. Metabolic syndrome - POCT glycosylated hemoglobin (Hb A1C); Future - POCT CBC - POCT glycosylated hemoglobin (Hb A1C) - POCT CBC - POCT glycosylated hemoglobin (Hb A1C)  6. Type 2 diabetes mellitus with diabetic nephropathy - POCT glycosylated hemoglobin (Hb A1C); Future - POCT CBC - POCT glycosylated hemoglobin (Hb A1C) - POCT CBC - POCT glycosylated hemoglobin (Hb A1C)  7. Vitamin D deficiency - Vit D  25 hydroxy (rtn osteoporosis monitoring); Future - POCT CBC - Vit D  25 hydroxy (rtn osteoporosis monitoring) - Vit D  25 hydroxy (rtn  osteoporosis monitoring)  8. Urine frequency - POCT UA - Microscopic Only - POCT urinalysis dipstick - POCT CBC  No orders of the defined types were placed in this encounter.   Patient Instructions                       Medicare Annual Wellness Visit  [facility] and the medical providers at [facility] strive to bring you the best medical care.  In doing so we not only want to address your current medical conditions and concerns but also to detect new conditions early and prevent illness, disease and health-related problems.    Medicare offers a yearly Wellness Visit which allows our clinical staff to assess your need for preventative services including immunizations, lifestyle education, counseling to decrease risk of preventable diseases and screening for fall risk and other medical concerns.    This visit is provided free of charge (no copay) for all Medicare recipients. The clinical pharmacists at [facility] have begun to  conduct these Wellness Visits which will also include a thorough review of all your medications.    As you primary medical provider recommend that you make an appointment for your Annual Wellness Visit if you have not done so already this year.  You may set up this appointment before you leave today or you may call back [phone]) and schedule an appointment.  Please make sure when you call that you mention that you are scheduling your Annual Wellness Visit with the clinical pharmacist so that the appointment may be made for the proper length of time.     Continue current medications. Continue good therapeutic lifestyle changes which include good diet and exercise. Fall precautions discussed with patient. If an FOBT was given today- please return it to our front desk. If you are over 22 years old - you may need Prevnar 48 or the adult Pneumonia vaccine.  Continue to monitor skin and feet Flu shots will be available  soon- come mid October if possible. We will call you with labs and urine report.    Arrie Senate MD

## 2013-10-04 NOTE — Patient Instructions (Addendum)
Medicare Annual Wellness Visit  [facility] and the medical providers at [facility] strive to bring you the best medical care.  In doing so we not only want to address your current medical conditions and concerns but also to detect new conditions early and prevent illness, disease and health-related problems.    Medicare offers a yearly Wellness Visit which allows our clinical staff to assess your need for preventative services including immunizations, lifestyle education, counseling to decrease risk of preventable diseases and screening for fall risk and other medical concerns.    This visit is provided free of charge (no copay) for all Medicare recipients. The clinical pharmacists at [facility] have begun to conduct these Wellness Visits which will also include a thorough review of all your medications.    As you primary medical provider recommend that you make an appointment for your Annual Wellness Visit if you have not done so already this year.  You may set up this appointment before you leave today or you may call back [phone] and schedule an appointment.  Please make sure when you call that you mention that you are scheduling your Annual Wellness Visit with the clinical pharmacist so that the appointment may be made for the proper length of time.     Continue current medications. Continue good therapeutic lifestyle changes which include good diet and exercise. Fall precautions discussed with patient. If an FOBT was given today- please return it to our front desk. If you are over 42 years old - you may need Prevnar 65 or the adult Pneumonia vaccine.  Continue to monitor skin and feet Flu shots will be available soon- come mid October if possible. We will call you with labs and urine report.

## 2013-10-05 ENCOUNTER — Other Ambulatory Visit: Payer: Self-pay | Admitting: *Deleted

## 2013-10-05 DIAGNOSIS — R8271 Bacteriuria: Secondary | ICD-10-CM

## 2013-10-05 LAB — BMP8+EGFR
BUN / CREAT RATIO: 17 (ref 10–22)
BUN: 25 mg/dL (ref 8–27)
CO2: 25 mmol/L (ref 18–29)
Calcium: 9.4 mg/dL (ref 8.6–10.2)
Chloride: 97 mmol/L (ref 97–108)
Creatinine, Ser: 1.48 mg/dL — ABNORMAL HIGH (ref 0.76–1.27)
GFR calc Af Amer: 51 mL/min/{1.73_m2} — ABNORMAL LOW (ref >59)
GFR calc non Af Amer: 44 mL/min/{1.73_m2} — ABNORMAL LOW (ref >59)
GLUCOSE: 257 mg/dL — AB (ref 65–99)
Potassium: 4.6 mmol/L (ref 3.5–5.2)
Sodium: 138 mmol/L (ref 134–144)

## 2013-10-05 LAB — HEPATIC FUNCTION PANEL
ALBUMIN: 4.2 g/dL (ref 3.5–4.7)
ALT: 14 IU/L (ref 0–44)
AST: 13 IU/L (ref 0–40)
Alkaline Phosphatase: 80 IU/L (ref 39–117)
Bilirubin, Direct: 0.16 mg/dL (ref 0.00–0.40)
Total Bilirubin: 0.5 mg/dL (ref 0.0–1.2)
Total Protein: 6.5 g/dL (ref 6.0–8.5)

## 2013-10-05 LAB — VITAMIN D 25 HYDROXY (VIT D DEFICIENCY, FRACTURES): Vit D, 25-Hydroxy: 50.6 ng/mL (ref 30.0–100.0)

## 2013-10-05 LAB — LIPID PANEL
Chol/HDL Ratio: 2.8 ratio (ref 0.0–5.0)
Cholesterol, Total: 144 mg/dL (ref 100–199)
HDL: 52 mg/dL
LDL Calculated: 75 mg/dL (ref 0–99)
Triglycerides: 84 mg/dL (ref 0–149)
VLDL Cholesterol Cal: 17 mg/dL (ref 5–40)

## 2013-10-05 NOTE — Progress Notes (Signed)
Urine was left yesterday so there wouldn't be any to send for a culture today. Patient to come back by and leave another specimen to send for culture.

## 2013-10-07 ENCOUNTER — Ambulatory Visit: Payer: Medicare Other | Admitting: Family Medicine

## 2013-10-11 ENCOUNTER — Other Ambulatory Visit: Payer: Medicare Other

## 2013-10-11 DIAGNOSIS — N390 Urinary tract infection, site not specified: Secondary | ICD-10-CM

## 2013-10-12 ENCOUNTER — Ambulatory Visit (HOSPITAL_COMMUNITY)
Admission: RE | Admit: 2013-10-12 | Discharge: 2013-10-12 | Disposition: A | Discharge status: Home / Self Care | Payer: Medicare Other | Source: Ambulatory Visit | Attending: Nephrology | Admitting: Nephrology

## 2013-10-12 DIAGNOSIS — R9389 Abnormal findings on diagnostic imaging of other specified body structures: Secondary | ICD-10-CM | POA: Diagnosis not present

## 2013-10-12 DIAGNOSIS — N289 Disorder of kidney and ureter, unspecified: Secondary | ICD-10-CM | POA: Insufficient documentation

## 2013-10-12 DIAGNOSIS — N200 Calculus of kidney: Secondary | ICD-10-CM | POA: Diagnosis not present

## 2013-10-13 ENCOUNTER — Telehealth: Payer: Self-pay | Admitting: Family Medicine

## 2013-10-13 LAB — URINE CULTURE

## 2013-10-13 NOTE — Telephone Encounter (Signed)
Patients aware of labs

## 2013-10-14 ENCOUNTER — Other Ambulatory Visit: Payer: Self-pay | Admitting: *Deleted

## 2013-10-14 MED ORDER — BUPROPION HCL ER (XL) 300 MG PO TB24: 300.0000 mg | ORAL_TABLET | ORAL | Status: DC

## 2013-10-20 DIAGNOSIS — R809 Proteinuria, unspecified: Secondary | ICD-10-CM | POA: Diagnosis not present

## 2013-10-20 DIAGNOSIS — Z79899 Other long term (current) drug therapy: Secondary | ICD-10-CM | POA: Diagnosis not present

## 2013-10-20 DIAGNOSIS — E559 Vitamin D deficiency, unspecified: Secondary | ICD-10-CM | POA: Diagnosis not present

## 2013-10-20 DIAGNOSIS — N189 Chronic kidney disease, unspecified: Secondary | ICD-10-CM | POA: Diagnosis not present

## 2013-10-20 DIAGNOSIS — I10 Essential (primary) hypertension: Secondary | ICD-10-CM | POA: Diagnosis not present

## 2013-10-20 DIAGNOSIS — D649 Anemia, unspecified: Secondary | ICD-10-CM | POA: Diagnosis not present

## 2013-10-26 ENCOUNTER — Telehealth: Payer: Self-pay | Admitting: Family Medicine

## 2013-10-26 DIAGNOSIS — N183 Chronic kidney disease, stage 3 unspecified: Secondary | ICD-10-CM | POA: Diagnosis not present

## 2013-10-26 DIAGNOSIS — I509 Heart failure, unspecified: Secondary | ICD-10-CM | POA: Diagnosis not present

## 2013-10-26 DIAGNOSIS — N200 Calculus of kidney: Secondary | ICD-10-CM | POA: Diagnosis not present

## 2013-10-26 DIAGNOSIS — I10 Essential (primary) hypertension: Secondary | ICD-10-CM | POA: Diagnosis not present

## 2013-10-26 NOTE — Telephone Encounter (Signed)
Patient aware.

## 2013-11-04 ENCOUNTER — Encounter: Payer: Self-pay | Admitting: Pharmacist

## 2013-11-04 ENCOUNTER — Ambulatory Visit (INDEPENDENT_AMBULATORY_CARE_PROVIDER_SITE_OTHER): Payer: Medicare Other | Admitting: Pharmacist

## 2013-11-04 VITALS — BP 136/78 | HR 77 | Ht 68.0 in | Wt 247.0 lb

## 2013-11-04 DIAGNOSIS — E1129 Type 2 diabetes mellitus with other diabetic kidney complication: Secondary | ICD-10-CM

## 2013-11-04 DIAGNOSIS — N058 Unspecified nephritic syndrome with other morphologic changes: Secondary | ICD-10-CM

## 2013-11-04 DIAGNOSIS — I10 Essential (primary) hypertension: Secondary | ICD-10-CM

## 2013-11-04 DIAGNOSIS — R7309 Other abnormal glucose: Secondary | ICD-10-CM | POA: Diagnosis not present

## 2013-11-04 DIAGNOSIS — E1121 Type 2 diabetes mellitus with diabetic nephropathy: Secondary | ICD-10-CM

## 2013-11-04 DIAGNOSIS — R739 Hyperglycemia, unspecified: Secondary | ICD-10-CM

## 2013-11-04 DIAGNOSIS — E785 Hyperlipidemia, unspecified: Secondary | ICD-10-CM | POA: Diagnosis not present

## 2013-11-04 DIAGNOSIS — I2510 Atherosclerotic heart disease of native coronary artery without angina pectoris: Secondary | ICD-10-CM

## 2013-11-04 LAB — GLUCOSE, POCT (MANUAL RESULT ENTRY): POC Glucose: 278 mg/dl — AB (ref 70–99)

## 2013-11-04 MED ORDER — SITAGLIPTIN PHOSPHATE 100 MG PO TABS: 50.0000 mg | ORAL_TABLET | Freq: Every day | ORAL | Status: DC

## 2013-11-04 NOTE — Progress Notes (Signed)
Diabetes Follow-Up Visit Chief Complaint:   Chief Complaint  Patient presents with  . Diabetes     Filed Vitals:   11/04/13 1237  BP: 136/78  Pulse: 77   HPI: Patient with long standing type 2 DM currently uncontrolled.  Currently only taking glipizide XL 10mg  qam.  He has taken Actos in past but was discontinued due hypoglycemia.  Also took metformin in past but was stopped due to decreased serum creatinine.  Patient was recently seen by urologist due to frequent UTI's and inability to fully empty bladder and also seen by nephrologist - Dr Burnett Sheng.  Home BG Monitoring:  Checking 1 times a day. (per patient recall) Average:  130's  High: 172  Low:  113  RBG in office today was 278  Low fat/carbohydrate diet?  Yes Nicotine Abuse?  No Medication Compliance?  Yes Exercise?  No Alcohol Abuse?  No  Patient is not currently taking ACEI or ARB.  He was previously taking Diovan 80mg  daily but it is currently on hold due to elevated serum creatine.  Serum creatine has improved since 4 months ago and appears to abe stabilizing.     Exam Edema:  Trace Polyuria:  negative  Polydipsia:  negative Polyphagia:  negative  BMI:  Body mass index is 37.56 kg/(m^2).   Weight changes:  stable General Appearance:  alert, oriented, no acute distress and obese Mood/Affect:  normal and very pleasant gentleman with great disposition   Lab Results  Component Value Date   HGBA1C 8.5% 10/04/2013    No results found for this basenameDerl Barrow    Lab Results  Component Value Date   CHOL 144 10/04/2013   HDL 52 10/04/2013   LDLCALC 75 10/04/2013   TRIG 84 10/04/2013   CHOLHDL 2.8 10/04/2013    Serum Creatine (08/11/215) = 1.48 EF = 50-55% (02/2013) Urine microalbumin = WNL (03/08/2013)  Assessment: 1.  Diabetes.  Uncontrolled with high post prandial readings 2.  Blood Pressure.  Controlled / at goal today 3.  Lipids.  At goals 4.  Need for ACEI / ARB - will continue to monitor  serum creatinine and consider restarting low dose once confirm stable SCr.  Recommendations: 1.  Medication recommendations at this time are as follows:  Add Januvia 100mg  1/2 tablet daily in am - gave #21 samples 2.  Reviewed HBG goals:  Fasting 80-130 and 1-2 hour post prandial <180.  Patient is instructed to check BG 1 times per day.    3.  BP goal < 140/85. 4.  LDL goal of < 100, HDL > 40 and TG < 150. 5.  Eye Exam yearly and Dental Exam every 6 months. 6.  Dietary recommendations:  Discussed CHO counting for diabetics in depth with patient.    Increase non-starchy vegetables - carrots, green bean, squash, zucchini, tomatoes, onions, peppers, spinach   and other green leafy vegetables, cabbage, lettuce, cucumbers, asparagus, okra (not fried), eggplant  limit sugar and processed foods (cakes, cookies, ice cream, crackers and chips)  Increase fresh fruit but limit serving sizes 1/2 cup or about the size of tennis or baseball  Limit red meat to no more than 1-2 times per week (serving size about the size of your palm)  Choose whole grains / lean proteins - whole wheat bread, quinoa, whole grain rice (1/2 cup), fish, chicken, [place] 7.  Physical Activity recommendations:  Patient has limited mobility  8.  Return to clinic in 4-6 wks   Time spent counseling  patient:  60 minutes  Referring provider:  Ermalinda Memos, PharmD, CPP, CDE

## 2013-11-04 NOTE — Patient Instructions (Signed)
Diabetes and Standards of Medical Care  Diabetes is complicated. You may find that your diabetes team includes a dietitian, nurse, diabetes educator, eye doctor, and more. To help everyone know what is going on and to help you get the care you deserve, the following schedule of care was developed to help keep you on track. Below are the tests, exams, vaccines, medicines, education, and plans you will need.  Blood Glucose Goals Prior to meals = 80 - 130 Within 2 hours of the start of a meal = less than 180  HbA1c test (goal is less than 7.0% - your last value was 7.1%) This test shows how well you have controlled your glucose over the past 2 3 months. It is used to see if your diabetes management plan needs to be adjusted.   It is performed at least 2 times a year if you are meeting treatment goals.  It is performed 4 times a year if therapy has changed or if you are not meeting treatment goals.   Blood pressure test  This test is performed at every routine medical visit. The goal is less than 140/90 mmHg for most people, but 130/80 mmHg in some cases. Ask your health care provider about your goal. Dental exam  Follow up with the dentist regularly. Eye exam  If you are diagnosed with type 1 diabetes as a child, get an exam upon reaching the age of 10 years or older and have had diabetes for 3 5 years. Yearly eye exams are recommended after that initial eye exam.  If you are diagnosed with type 1 diabetes as an adult, get an exam within 5 years of diagnosis and then yearly.  If you are diagnosed with type 2 diabetes, get an exam as soon as possible after the diagnosis and then yearly. Foot care exam  Visual foot exams are performed at every routine medical visit. The exams check for cuts, injuries, or other problems with the feet.  A comprehensive foot exam should be done yearly. This includes visual inspection as well as assessing foot pulses and testing for loss of sensation.  Check  your feet nightly for cuts, injuries, or other problems with your feet. Tell your health care provider if anything is not healing. Kidney function test (urine microalbumin)  This test is performed once a year.  Type 1 diabetes: The first test is performed 5 years after diagnosis.  Type 2 diabetes: The first test is performed at the time of diagnosis.  A serum creatinine and estimated glomerular filtration rate (eGFR) test is done once a year to assess the level of chronic kidney disease (CKD), if present. Lipid profile (cholesterol, HDL, LDL, triglycerides)  Performed every 5 years for most people.  The goal for LDL is less than 100 mg/dL. If you are at high risk, the goal is less than 70 mg/dL.  The goal for HDL is 40 mg/dL 50 mg/dL for men and 50 mg/dL 60 mg/dL for women. An HDL cholesterol of 60 mg/dL or higher gives some protection against heart disease.  The goal for triglycerides is less than 150 mg/dL. Influenza vaccine, pneumococcal vaccine, and hepatitis B vaccine  The influenza vaccine is recommended yearly.  The pneumococcal vaccine is generally given once in a lifetime. However, there are some instances when another vaccination is recommended. Check with your health care provider.  The hepatitis B vaccine is also recommended for adults with diabetes. Diabetes self-management education  Education is recommended at diagnosis and ongoing   as needed. Treatment plan  Your treatment plan is reviewed at every medical visit. Document Released: 12/08/2008 Document Revised: 10/13/2012 Document Reviewed: 07/13/2012 [facility] Patient Information 2014 [place].

## 2013-11-17 ENCOUNTER — Ambulatory Visit (INDEPENDENT_AMBULATORY_CARE_PROVIDER_SITE_OTHER): Payer: Medicare Other | Admitting: Cardiology

## 2013-11-17 ENCOUNTER — Encounter: Payer: Self-pay | Admitting: *Deleted

## 2013-11-17 ENCOUNTER — Ambulatory Visit: Payer: Medicare Other | Admitting: Cardiology

## 2013-11-17 ENCOUNTER — Encounter: Payer: Self-pay | Admitting: Cardiology

## 2013-11-17 VITALS — BP 132/72 | HR 76 | Ht 68.0 in | Wt 240.0 lb

## 2013-11-17 DIAGNOSIS — I2510 Atherosclerotic heart disease of native coronary artery without angina pectoris: Secondary | ICD-10-CM | POA: Diagnosis not present

## 2013-11-17 DIAGNOSIS — I10 Essential (primary) hypertension: Secondary | ICD-10-CM

## 2013-11-17 DIAGNOSIS — I5032 Chronic diastolic (congestive) heart failure: Secondary | ICD-10-CM | POA: Diagnosis not present

## 2013-11-17 DIAGNOSIS — I6523 Occlusion and stenosis of bilateral carotid arteries: Secondary | ICD-10-CM

## 2013-11-17 DIAGNOSIS — E785 Hyperlipidemia, unspecified: Secondary | ICD-10-CM

## 2013-11-17 DIAGNOSIS — I658 Occlusion and stenosis of other precerebral arteries: Secondary | ICD-10-CM

## 2013-11-17 DIAGNOSIS — I6529 Occlusion and stenosis of unspecified carotid artery: Secondary | ICD-10-CM | POA: Diagnosis not present

## 2013-11-17 DIAGNOSIS — I509 Heart failure, unspecified: Secondary | ICD-10-CM

## 2013-11-17 DIAGNOSIS — N189 Chronic kidney disease, unspecified: Secondary | ICD-10-CM

## 2013-11-17 MED ORDER — FUROSEMIDE 20 MG PO TABS: 20.0000 mg | ORAL_TABLET | Freq: Every morning | ORAL | Status: DC

## 2013-11-17 NOTE — Patient Instructions (Signed)
Your physician has requested that you have a carotid duplex. This test is an ultrasound of the carotid arteries in your neck. It looks at blood flow through these arteries that supply the brain with blood. Allow one hour for this exam. There are no restrictions or special instructions. January 2016  Your physician wants you to follow-up in: 6 months with Dr Aundra Dubin. (March 2016).  You will receive a reminder letter in the mail two months in advance. If you don't receive a letter, please call our office to schedule the follow-up appointment.

## 2013-11-18 NOTE — Progress Notes (Signed)
Patient ID: Bobby Stjulien., male   DOB: [birth year], 80 y.o.   MRN: [id number] PCP: Dr. Laurance Flatten  80 yo with history of CAD s/p CABG and chronic diastolic CHF presents for cardiology followup.  His main problem in the last couple of years has been gluteal pressure ulcers.  These became infected and he has had extensive treatment with antibiotics and wound care.  He was very inactive during this period. They have finally healed. He is somewhat unsteady walking and uses a walker.  No chest pain. Last echo was done in 1/15 and showed EF 50-55% with normal RV size and mildly decreased RV systolic function.  At last appointment, I thought that he had some volume overload on exam.  I started him on Lasix 20 mg daily. His weight is down 7 lbs.  Breathing has been getting better.  He is short of breath with moderate exertion but this is improved.  He was able to walk into the office today without problems.    Labs (9/14): LDL particle number [id number], LDL 74 Labs (11/14): K 4.7,creatinine 1.26 Labs (5/15): K 5.1, creatinine 1.79, LDL 66, LDL-P 1001 Labs (8/15): K 4.6, creatinine 1.48, LDL 75, HDL 52, BNP 45  PMH: 1. Mild spina bifida 2. Type II diabetes 3. Hyperlipidemia 4. HTN 5. CKD 6. Anemia 7. PTSD 8. CAD: CABG 7/98 (Dr. Redmond Pulling).  Last Myoview in 2/07 showed EF 63%, no ischemia.  9. Gluteal decubitus ulcers 10. Obesity 11. Carotid stenosis: Carotid dopplers (5/13) with 40-59% bilateral ICA stenosis.  Carotid dopplers (1/15) with 40-59% bilateral ICA stenosis. 12. Diastolic CHF: Echo (0000000) with EF 50-55%, normal RV size with mildly decreased systolic function.   SH: Married, lives in [place], retired from [employer], prior smoker.   FH: CAD  ROS: All systems reviewed and negative except as per HPI.   Current Outpatient Prescriptions  Medication Sig Dispense Refill  . aspirin 81 MG tablet Take 81 mg by mouth daily.        Marland Kitchen buPROPion (WELLBUTRIN XL) 300 MG 24 hr tablet Take 1  tablet (300 mg total) by mouth every morning.  90 tablet  0  . Cholecalciferol (VITAMIN D-3) 5000 UNITS TABS Take 1 tablet by mouth daily.        . Coenzyme Q10 (CO Q-10) 100 MG CAPS Take 1 capsule by mouth 2 (two) times daily.       Marland Kitchen ezetimibe-simvastatin (VYTORIN) 10-20 MG per tablet TAKE 1 TABLET DAILYqpm      . furosemide (LASIX) 20 MG tablet Take 1 tablet (20 mg total) by mouth every morning.  90 tablet  3  . GLIPIZIDE XL 10 MG 24 hr tablet TAKE 1 TABLET DAILY  90 tablet  0  . metoprolol tartrate (LOPRESSOR) 25 MG tablet TAKE 1 TABLET TWICE A DAY  180 tablet  1  . Multiple Vitamins-Minerals (PRESERVISION AREDS 2 PO) Take 1 capsule by mouth 2 (two) times daily.      . nitroGLYCERIN (NITROSTAT) 0.4 MG SL tablet Place 1 tablet (0.4 mg total) under the tongue every 5 (five) minutes as needed.  25 tablet  6  . NON FORMULARY cran-actin 100mg  1 tablet daily      . Omega-3 Fatty Acids (FISH OIL) 1000 MG CAPS Take 1 capsule by mouth 2 (two) times daily.        . polyethylene glycol powder (GLYCOLAX/MIRALAX) powder Take 17 g by mouth 2 (two) times daily as needed.  3350 g  1  . sitaGLIPtin (JANUVIA) 100 MG tablet Take 0.5 tablets (50 mg total) by mouth daily.  21 tablet  0   No current facility-administered medications for this visit.    BP 132/72  Pulse 76  Ht 5\' 8"  (1.727 m)  Wt 240 lb (108.863 kg)  BMI 36.50 kg/m2  SpO2 94% General: NAD Neck: JVP 7 cm, no thyromegaly or thyroid nodule.  Lungs: Mild crackles at bases bilaterally. CV: Nondisplaced PMI.  Heart regular S1/S2, no S3/S4, no murmur.  No peripheral edema.  No carotid bruit.  Normal pedal pulses.  Abdomen: Soft, nontender, no hepatosplenomegaly, no distention.  Neurologic: Alert and oriented x 3.  Psych: Normal affect. Extremities: No clubbing or cyanosis.   Assessment/Plan: 1. CAD: s/p CABG in 1998.  He has not had cardiac cath since that time.  Last Myoview was in 2007 and was nonischemic. No chest pain.   - Continue ASA  81, statin, metoprolol.  2. Carotid stenosis: Stable, repeat dopplers 1/17.   3. Hyperlipidemia: Followed by Dr. Laurance Flatten.  LDL particle number improved in 5/15.  Continue current statin. 4. HTN: Controlled off valsartan for now, would not add additional meds at this time.  5. Chronic diastolic CHF: EF 99991111 on echo.  Weight is down 7 lbs on Lasix and he feels better. NYHA class II-III symptoms now but suspect deconditioning is playing a large role.  BMET was stable on Lasix when last done in 8/15.  6. CKD: Now off valsartan.  Follow creatinine closely.    Followup in 6 months.   Loralie Champagne 11/18/2013

## 2013-11-22 DIAGNOSIS — B351 Tinea unguium: Secondary | ICD-10-CM | POA: Diagnosis not present

## 2013-11-22 DIAGNOSIS — E1149 Type 2 diabetes mellitus with other diabetic neurological complication: Secondary | ICD-10-CM | POA: Diagnosis not present

## 2013-11-22 DIAGNOSIS — L851 Acquired keratosis [keratoderma] palmaris et plantaris: Secondary | ICD-10-CM | POA: Diagnosis not present

## 2013-12-02 ENCOUNTER — Other Ambulatory Visit: Payer: Self-pay | Admitting: Family Medicine

## 2013-12-06 ENCOUNTER — Telehealth: Payer: Self-pay | Admitting: Family Medicine

## 2013-12-07 NOTE — Telephone Encounter (Signed)
No Januvia samples available today.  Patient states he has about 1 week left.  He will call back next week to see if we have received samples.  After visit 12/23/2013 if A1c and BG better then will give patient Rx for [place].

## 2013-12-09 ENCOUNTER — Ambulatory Visit: Payer: Self-pay

## 2013-12-11 ENCOUNTER — Other Ambulatory Visit: Payer: Self-pay | Admitting: Family Medicine

## 2013-12-23 ENCOUNTER — Encounter: Payer: Self-pay | Admitting: Pharmacist

## 2013-12-23 ENCOUNTER — Ambulatory Visit (INDEPENDENT_AMBULATORY_CARE_PROVIDER_SITE_OTHER): Payer: Medicare Other | Admitting: Pharmacist

## 2013-12-23 VITALS — BP 112/62 | HR 75 | Ht 68.0 in | Wt 237.0 lb

## 2013-12-23 DIAGNOSIS — Z23 Encounter for immunization: Secondary | ICD-10-CM

## 2013-12-23 DIAGNOSIS — Z0000 Encounter for general adult medical examination without abnormal findings: Secondary | ICD-10-CM

## 2013-12-23 MED ORDER — SITAGLIPTIN PHOS-METFORMIN HCL 50-500 MG PO TABS: ORAL_TABLET | ORAL | Status: DC

## 2013-12-23 NOTE — Progress Notes (Signed)
Patient ID: [name]., male   DOB: Oct 24, 1930, 80 y.o.   MRN: [id number] Subjective:    [name]. is a 80 y.o. male who presents for Medicare Initial Wellness Visit and recheck diabetes    Patient with long standing type 2 DM currently uncontrolled. Currently taking glipizide XL 10mg  qam adn Januvia 100mg  1/2 tablet daily.   He has taken Actos in past but was discontinued due hypoglycemia. Also took metformin in past but was stopped due to decreased serum creatinine.   Home BG Monitoring: Checking 2 times a day.  HBG reading average:  Am = 117 (but increased to 160 over last week without Januvia);   PM = 181 (increased to 220 over last weeks withouth Januvia  Low fat/carbohydrate diet? Yes  Nicotine Abuse? No  Medication Compliance? Yes  Exercise? No  Alcohol Abuse? No   Patient is not currently taking ACEI or ARB. He was previously taking Diovan 80mg  daily but it is currently on hold due to elevated serum creatine. Serum creatine has improved since 4 months ago and appears to abe stabilizing.    Preventive Screening-Counseling & Management  Tobacco History  Smoking status  . Former Smoker -- 2.00 packs/day for 20 years  . Types: Cigarettes  . Start date: 11/20/1948  . Quit date: 08/19/1980  Smokeless tobacco  . Former Systems developer  . Types: Chew    Comment: 01/20/2013 "quit chewing 20 yr ago"    Current Problems (verified) Patient Active Problem List   Diagnosis Date Noted  . Chronic diastolic CHF (congestive heart failure) 09/22/2013  . Constipation 07/05/2013  . Cellulitis 01/19/2013  . Abscess or cellulitis of gluteal region 01/19/2013  . UTI (lower urinary tract infection) 01/19/2013  . Urinary incontinence 12/13/2012  . External hemorrhoids with complication 99991111  . Chronic constipation 11/29/2012  . Metabolic syndrome [id number]  . Hemorrhoid prolapse 11/29/2012  . Spina bifida 11/18/2012  . CAD, NATIVE VESSEL 03/08/2009  . Type 2 diabetes  mellitus with diabetic nephropathy 10/29/2008  . MALNUTRITION, PROTEIN-CALORIE 10/29/2008  . HYPERLIPIDEMIA 10/29/2008  . HYPOKALEMIA 10/29/2008  . ANEMIA 10/29/2008  . PTSD 10/29/2008  . HYPERTENSION, UNSPECIFIED 10/29/2008  . RENAL INSUFFICIENCY, CHRONIC 10/29/2008    Medications Prior to Visit Current Outpatient Prescriptions on File Prior to Visit  Medication Sig Dispense Refill  . aspirin 81 MG tablet Take 81 mg by mouth daily.        Marland Kitchen buPROPion (WELLBUTRIN XL) 300 MG 24 hr tablet Take 1 tablet (300 mg total) by mouth every morning.  90 tablet  0  . Cholecalciferol (VITAMIN D-3) 5000 UNITS TABS Take 1 tablet by mouth daily.        . Coenzyme Q10 (CO Q-10) 100 MG CAPS Take 1 capsule by mouth 2 (two) times daily.       Marland Kitchen ezetimibe-simvastatin (VYTORIN) 10-20 MG per tablet TAKE 1 TABLET DAILYqpm      . furosemide (LASIX) 20 MG tablet Take 1 tablet (20 mg total) by mouth every morning.  90 tablet  3  . GLIPIZIDE XL 10 MG 24 hr tablet TAKE 1 TABLET DAILY  90 tablet  0  . metoprolol tartrate (LOPRESSOR) 25 MG tablet TAKE 1 TABLET TWICE A DAY  180 tablet  1  . Multiple Vitamins-Minerals (PRESERVISION AREDS 2 PO) Take 1 capsule by mouth 2 (two) times daily.      . nitroGLYCERIN (NITROSTAT) 0.4 MG SL tablet Place 1 tablet (0.4 mg total) under the tongue every 5 (  five) minutes as needed.  25 tablet  6  . NON FORMULARY cran-actin 100mg  1 tablet daily      . Omega-3 Fatty Acids (FISH OIL) 1000 MG CAPS Take 1 capsule by mouth 2 (two) times daily.        . polyethylene glycol powder (GLYCOLAX/MIRALAX) powder Take 17 g by mouth 2 (two) times daily as needed.  3350 g  1   No current facility-administered medications on file prior to visit.    Current Medications (verified) Current Outpatient Prescriptions  Medication Sig Dispense Refill  . aspirin 81 MG tablet Take 81 mg by mouth daily.        Marland Kitchen buPROPion (WELLBUTRIN XL) 300 MG 24 hr tablet Take 1 tablet (300 mg total) by mouth every morning.   90 tablet  0  . Cholecalciferol (VITAMIN D-3) 5000 UNITS TABS Take 1 tablet by mouth daily.        . Coenzyme Q10 (CO Q-10) 100 MG CAPS Take 1 capsule by mouth 2 (two) times daily.       Marland Kitchen ezetimibe-simvastatin (VYTORIN) 10-20 MG per tablet TAKE 1 TABLET DAILYqpm      . furosemide (LASIX) 20 MG tablet Take 1 tablet (20 mg total) by mouth every morning.  90 tablet  3  . GLIPIZIDE XL 10 MG 24 hr tablet TAKE 1 TABLET DAILY  90 tablet  0  . metoprolol tartrate (LOPRESSOR) 25 MG tablet TAKE 1 TABLET TWICE A DAY  180 tablet  1  . Multiple Vitamins-Minerals (PRESERVISION AREDS 2 PO) Take 1 capsule by mouth 2 (two) times daily.      . nitroGLYCERIN (NITROSTAT) 0.4 MG SL tablet Place 1 tablet (0.4 mg total) under the tongue every 5 (five) minutes as needed.  25 tablet  6  . NON FORMULARY cran-actin 100mg  1 tablet daily      . Omega-3 Fatty Acids (FISH OIL) 1000 MG CAPS Take 1 capsule by mouth 2 (two) times daily.        . polyethylene glycol powder (GLYCOLAX/MIRALAX) powder Take 17 g by mouth 2 (two) times daily as needed.  3350 g  1  . sitaGLIPtin-metformin (JANUMET) 50-500 MG per tablet Take 1 tablet by mouth once daily with food  90 tablet  0   No current facility-administered medications for this visit.     Allergies (verified) Aspirin; Sulfonamide derivatives; and Nitrofurantoin   PAST HISTORY  Family History Family History  Problem Relation Age of Onset  . Heart disease Mother   . Heart attack Mother   . Heart disease Father   . Heart attack Father   . Cancer Sister     breast  . Cancer Sister     lung and ovarian    Social History History  Substance Use Topics  . Smoking status: Former Smoker -- 2.00 packs/day for 20 years    Types: Cigarettes    Start date: 11/20/1948    Quit date: 08/19/1980  . Smokeless tobacco: Former User    Types: Chew     Comment: 01/20/2013 "quit chewing 20 yr ago"  . Alcohol Use: Yes     Comment: 01/20/2013 "quit drinking 08/19/1980"    Are  there smokers in your home (other than you)?  No  Risk Factors Current exercise habits: The patient does not participate in regular exercise at present.  Dietary issues discussed: limits CHO and fat in diet   Cardiac risk factors: advanced age (older than 55 for men, 71 for women), diabetes  mellitus, dyslipidemia, family history of premature cardiovascular disease, hypertension, male gender, obesity (BMI >= 30 kg/m2) and sedentary lifestyle.  Depression Screen (Note: if answer to either of the following is "Yes", a more complete depression screening is indicated)   Q1: Over the past two weeks, have you felt down, depressed or hopeless? Yes  Q2: Over the past two weeks, have you felt little interest or pleasure in doing things? No  Have you lost interest or pleasure in daily life? No  Do you often feel hopeless? No  Do you cry easily over simple problems? Yes  Activities of Daily Living In your present state of health, do you have any difficulty performing the following activities?:  Driving? No Managing money?  No Feeding yourself? No Getting from bed to chair? No Climbing a flight of stairs? Yes Preparing food and eating?: No Bathing or showering? No Getting dressed: No Getting to the toilet? No Using the toilet:No Moving around from place to place: Yes - uses rolling walker with seat. In the past year have you fallen or had a near fall?:Yes   Are you sexually active?  No  Do you have more than one partner?  No  Hearing Difficulties: Yes -  Patient has been assessed by [place] and they determined that he did not meet requirement for hearing aids. Do you often ask people to speak up or repeat themselves? Yes Do you experience ringing or noises in your ears? No Do you have difficulty understanding soft or whispered voices? No   Do you feel that you have a problem with memory? No  Do you often misplace items? No  Do you feel safe at home?  Yes  Cognitive Testing  Alert? Yes   Normal Appearance?Yes  Oriented to person? Yes  Place? Yes   Time? Yes  Recall of three objects?  Yes  Can perform simple calculations? Yes  Displays appropriate judgment?Yes  Can read the correct time from a watch face?Yes   Advanced Directives have been discussed with the patient? Yes   List the Names of Other Physician/Practitioners you currently use: 1.  [facility]  - Cardiologist 2.  [place] - Urologist 3.  [place] 4.  [place] - Nephrologist 5.  Andres Labrum - chiropractor 6.  [place] - Psyciatrist 7.  Dellia Nims - wound center   Indicate any recent Medical Services you may have received from other than [facility] providers in the past year (date may be approximate).  Immunization History  Administered Date(s) Administered  . Influenza,inj,Quad PF,36+ Mos 11/18/2012, 12/23/2013  . Pneumococcal Conjugate-13 03/08/2013    Screening Tests Health Maintenance  Topic Date Due  . Influenza Vaccine  09/24/2013  . Zostavax  10/05/2014 (Originally 11/20/1992)  . Urine Microalbumin  03/08/2014  . Hemoglobin A1c  04/06/2014  . Ophthalmology Exam  08/16/2014  . Foot Exam  10/05/2014  . Colonoscopy  12/25/2016  . Tetanus/tdap  11/24/2020  . Pneumococcal Polysaccharide Vaccine Age 30 And Over  Completed    All answers were reviewed with the patient and necessary referrals were made:  Cherre Robins, [facility]   12/23/2013   History reviewed: allergies, current medications, past family history, past medical history, past social history, past surgical history and problem list   Objective:    Blood pressure 112/62, pulse 75, height 5\' 8"  (1.727 m), weight 237 lb (107.502 kg). Body mass index is 36.04 kg/(m^2).   Assessment:     Uncontrolled type 2 DM Initial Medicare Wellness Visit  Plan:     During the course of the visit the patient was educated and counseled about appropriate screening and preventive services including:    Pneumococcal vaccine - UTD  Influenza  vaccine - Given in office today  Td vaccine - UTD  Prostate cancer screening - UTD  Colorectal cancer screening - UTD  Glaucoma screening - UTD  Nutrition counseling - discussed carb counting diet  Advanced directives: has an advanced directive - a copy HAS NOT been provided.  Change Januvia to janumet 50/500mg  take 1 tablet daily with food.  Will need to monitor serum creatinine closely.  Discontinue is Serum creatinine increases to over 1.5.  Due to recheck in about 4 weeks.   Patient declined zostavax   Suggested appt with audiologist since has bee over 10 years since [place] has assessed his hearing.  Patient declined referral today but will think about it.   Referral for DEXA since patient reports 2 inch height loss.     Patient Instructions (the written plan) was given to the patient.  Medicare Attestation I have personally reviewed: The patient's medical and social history Their use of alcohol, tobacco or illicit drugs Their current medications and supplements The patient's functional ability including ADLs,fall risks, home safety risks, cognitive, and hearing and visual impairment Diet and physical activities Evidence for depression or mood disorders  The patient's weight, height, BMI, and BP/HR have been recorded in the chart.  I have made referrals, counseling, and provided education to the patient based on review of the above and I have provided the patient with a written personalized care plan for preventive services.     Cherre Robins, [facility]   12/23/2013

## 2013-12-23 NOTE — Patient Instructions (Signed)
Health Maintenance Summary   Bone Density / DEXA   Referral made today    INFLUENZA VACCINE Next Due  Fall 2016 Received today in office    ZOSTAVAX Postponed 10/05/2014     URINE MICROALBUMIN Next Due 03/08/2014  Last done 03/09/2013    HEMOGLOBIN A1C Next Due 12/ 2015 Last was 8.5% (10/04/2013)    OPHTHALMOLOGY EXAM Next Due 08/16/2014  Last was 07/2013    FOOT EXAM Next Due 10/05/2014  Last was 09/2013    COLONOSCOPY Next Due 12/25/2016  Last was 12/26/2006    TETANUS/TDAP Next Due 11/24/2020  Last was 11/25/2010   Preventive Care for Adults A healthy lifestyle and preventive care can promote health and wellness. Preventive health guidelines for men include the following key practices:  A routine yearly physical is a good way to check with your health care provider about your health and preventative screening. It is a chance to share any concerns and updates on your health and to receive a thorough exam.  Visit your dentist for a routine exam and preventative care every 6 months. Brush your teeth twice a day and floss once a day. Good oral hygiene prevents tooth decay and gum disease.  The frequency of eye exams is based on your age, health, family medical history, use of contact lenses, and other factors. Follow your health care provider's recommendations for frequency of eye exams.  Eat a healthy diet. Foods such as vegetables, fruits, whole grains, low-fat dairy products, and lean protein foods contain the nutrients you need without too many calories. Decrease your intake of foods high in solid fats, added sugars, and salt. Eat the right amount of calories for you.Get information about a proper diet from your health care provider, if necessary.  Regular physical exercise is one of the most important things you can do for your health. Most adults should get at least 150 minutes of moderate-intensity exercise (any activity that increases your heart rate and causes you to sweat) each week. In  addition, most adults need muscle-strengthening exercises on 2 or more days a week.  Maintain a healthy weight. The body mass index (BMI) is a screening tool to identify possible weight problems. It provides an estimate of body fat based on height and weight. Your health care provider can find your BMI and can help you achieve or maintain a healthy weight.For adults 20 years and older:  A BMI below 18.5 is considered underweight.  A BMI of 18.5 to 24.9 is normal.  A BMI of 25 to 29.9 is considered overweight.  A BMI of 30 and above is considered obese.  Maintain normal blood lipids and cholesterol levels by exercising and minimizing your intake of saturated fat. Eat a balanced diet with plenty of fruit and vegetables. Blood tests for lipids and cholesterol should begin at age 70 and be repeated every 5 years. If your lipid or cholesterol levels are high, you are over 50, or you are at high risk for heart disease, you may need your cholesterol levels checked more frequently.Ongoing high lipid and cholesterol levels should be treated with medicines if diet and exercise are not working.  If you smoke, find out from your health care provider how to quit. If you do not use tobacco, do not start.  Lung cancer screening is recommended for adults aged 23-80 years who are at high risk for developing lung cancer because of a history of smoking. A yearly low-dose CT scan of the lungs is recommended  for people who have at least a 30-pack-year history of smoking and are a current smoker or have quit within the past 15 years. A pack year of smoking is smoking an average of 1 pack of cigarettes a day for 1 year (for example: 1 pack a day for 30 years or 2 packs a day for 15 years). Yearly screening should continue until the smoker has stopped smoking for at least 15 years. Yearly screening should be stopped for people who develop a health problem that would prevent them from having lung cancer treatment.  If  you choose to drink alcohol, do not have more than 2 drinks per day. One drink is considered to be 12 ounces (355 mL) of beer, 5 ounces (148 mL) of wine, or 1.5 ounces (44 mL) of liquor.  Avoid use of street drugs. Do not share needles with anyone. Ask for help if you need support or instructions about stopping the use of drugs.  High blood pressure causes heart disease and increases the risk of stroke. Your blood pressure should be checked at least every 1-2 years. Ongoing high blood pressure should be treated with medicines, if weight loss and exercise are not effective.  If you are 67-59 years old, ask your health care provider if you should take aspirin to prevent heart disease.  Diabetes screening involves taking a blood sample to check your fasting blood sugar level. This should be done once every 3 years, after age 18, if you are within normal weight and without risk factors for diabetes. Testing should be considered at a younger age or be carried out more frequently if you are overweight and have at least 1 risk factor for diabetes.  Colorectal cancer can be detected and often prevented. Most routine colorectal cancer screening begins at the age of 30 and continues through age 27. However, your health care provider may recommend screening at an earlier age if you have risk factors for colon cancer. On a yearly basis, your health care provider may provide home test kits to check for hidden blood in the stool. Use of a small camera at the end of a tube to directly examine the colon (sigmoidoscopy or colonoscopy) can detect the earliest forms of colorectal cancer. Talk to your health care provider about this at age 5, when routine screening begins. Direct exam of the colon should be repeated every 5-10 years through age 25, unless early forms of precancerous polyps or small growths are found.  People who are at an increased risk for hepatitis B should be screened for this virus. You are considered  at high risk for hepatitis B if:  You were born in a country where hepatitis B occurs often. Talk with your health care provider about which countries are considered high risk.  Your parents were born in a high-risk country and you have not received a shot to protect against hepatitis B (hepatitis B vaccine).  You have HIV or AIDS.  You use needles to inject street drugs.  You live with, or have sex with, someone who has hepatitis B.  You are a man who has sex with other men (MSM).  You get hemodialysis treatment.  You take certain medicines for conditions such as cancer, organ transplantation, and autoimmune conditions.  Hepatitis C blood testing is recommended for all people born from [birth year] through 1965 and any individual with known risks for hepatitis C.  Practice safe sex. Use condoms and avoid high-risk sexual practices to reduce the spread  of sexually transmitted infections (STIs). STIs include gonorrhea, chlamydia, syphilis, trichomonas, herpes, HPV, and human immunodeficiency virus (HIV). Herpes, HIV, and HPV are viral illnesses that have no cure. They can result in disability, cancer, and death.  If you are at risk of being infected with HIV, it is recommended that you take a prescription medicine daily to prevent HIV infection. This is called preexposure prophylaxis (PrEP). You are considered at risk if:  You are a man who has sex with other men (MSM) and have other risk factors.  You are a heterosexual man, are sexually active, and are at increased risk for HIV infection.  You take drugs by injection.  You are sexually active with a partner who has HIV.  Talk with your health care provider about whether you are at high risk of being infected with HIV. If you choose to begin PrEP, you should first be tested for HIV. You should then be tested every 3 months for as long as you are taking PrEP.  A one-time screening for abdominal aortic aneurysm (AAA) and surgical repair of  large AAAs by ultrasound are recommended for men ages 28 to 50 years who are current or former smokers.  Healthy men should no longer receive prostate-specific antigen (PSA) blood tests as part of routine cancer screening. Talk with your health care provider about prostate cancer screening.  Testicular cancer screening is not recommended for adult males who have no symptoms. Screening includes self-exam, a health care provider exam, and other screening tests. Consult with your health care provider about any symptoms you have or any concerns you have about testicular cancer.  Use sunscreen. Apply sunscreen liberally and repeatedly throughout the day. You should seek shade when your shadow is shorter than you. Protect yourself by wearing long sleeves, pants, a wide-brimmed hat, and sunglasses year round, whenever you are outdoors.  Once a month, do a whole-body skin exam, using a mirror to look at the skin on your back. Tell your health care provider about new moles, moles that have irregular borders, moles that are larger than a pencil eraser, or moles that have changed in shape or color.  Stay current with required vaccines (immunizations).  Influenza vaccine. All adults should be immunized every year.  Tetanus, diphtheria, and acellular pertussis (Td, Tdap) vaccine. An adult who has not previously received Tdap or who does not know his vaccine status should receive 1 dose of Tdap. This initial dose should be followed by tetanus and diphtheria toxoids (Td) booster doses every 10 years. Adults with an unknown or incomplete history of completing a 3-dose immunization series with Td-containing vaccines should begin or complete a primary immunization series including a Tdap dose. Adults should receive a Td booster every 10 years.  Varicella vaccine. An adult without evidence of immunity to varicella should receive 2 doses or a second dose if he has previously received 1 dose.  Human papillomavirus  (HPV) vaccine. Males aged 75-21 years who have not received the vaccine previously should receive the 3-dose series. Males aged 22-26 years may be immunized. Immunization is recommended through the age of 72 years for any male who has sex with males and did not get any or all doses earlier. Immunization is recommended for any person with an immunocompromised condition through the age of 41 years if he did not get any or all doses earlier. During the 3-dose series, the second dose should be obtained 4-8 weeks after the first dose. The third dose should be obtained  24 weeks after the first dose and 16 weeks after the second dose.  Zoster vaccine. One dose is recommended for adults aged 72 years or older unless certain conditions are present.  Measles, mumps, and rubella (MMR) vaccine. Adults born before [birth year] generally are considered immune to measles and mumps. Adults born in [birth year] or later should have 1 or more doses of MMR vaccine unless there is a contraindication to the vaccine or there is laboratory evidence of immunity to each of the three diseases. A routine second dose of MMR vaccine should be obtained at least 28 days after the first dose for students attending postsecondary schools, health care workers, or international travelers. People who received inactivated measles vaccine or an unknown type of measles vaccine during 1963-1967 should receive 2 doses of MMR vaccine. People who received inactivated mumps vaccine or an unknown type of mumps vaccine before 1979 and are at high risk for mumps infection should consider immunization with 2 doses of MMR vaccine. Unvaccinated health care workers born before 25 who lack laboratory evidence of measles, mumps, or rubella immunity or laboratory confirmation of disease should consider measles and mumps immunization with 2 doses of MMR vaccine or rubella immunization with 1 dose of MMR vaccine.  Pneumococcal 13-valent conjugate (PCV13) vaccine. When indicated,  a person who is uncertain of his immunization history and has no record of immunization should receive the PCV13 vaccine. An adult aged 47 years or older who has certain medical conditions and has not been previously immunized should receive 1 dose of PCV13 vaccine. This PCV13 should be followed with a dose of pneumococcal polysaccharide (PPSV23) vaccine. The PPSV23 vaccine dose should be obtained at least 8 weeks after the dose of PCV13 vaccine. An adult aged 38 years or older who has certain medical conditions and previously received 1 or more doses of PPSV23 vaccine should receive 1 dose of PCV13. The PCV13 vaccine dose should be obtained 1 or more years after the last PPSV23 vaccine dose.  Pneumococcal polysaccharide (PPSV23) vaccine. When PCV13 is also indicated, PCV13 should be obtained first. All adults aged 5 years and older should be immunized. An adult younger than age 53 years who has certain medical conditions should be immunized. Any person who resides in a nursing home or long-term care facility should be immunized. An adult smoker should be immunized. People with an immunocompromised condition and certain other conditions should receive both PCV13 and PPSV23 vaccines. People with human immunodeficiency virus (HIV) infection should be immunized as soon as possible after diagnosis. Immunization during chemotherapy or radiation therapy should be avoided. Routine use of PPSV23 vaccine is not recommended for American Indians, [place] Natives, or people younger than 65 years unless there are medical conditions that require PPSV23 vaccine. When indicated, people who have unknown immunization and have no record of immunization should receive PPSV23 vaccine. One-time revaccination 5 years after the first dose of PPSV23 is recommended for people aged 19-64 years who have chronic kidney failure, nephrotic syndrome, asplenia, or immunocompromised conditions. People who received 1-2 doses of PPSV23 before age 10  years should receive another dose of PPSV23 vaccine at age 17 years or later if at least 5 years have passed since the previous dose. Doses of PPSV23 are not needed for people immunized with PPSV23 at or after age 15 years.  Meningococcal vaccine. Adults with asplenia or persistent complement component deficiencies should receive 2 doses of quadrivalent meningococcal conjugate (MenACWY-D) vaccine. The doses should be obtained at least 2 months  apart. Microbiologists working with certain meningococcal bacteria, [place] recruits, people at risk during an outbreak, and people who travel to or live in countries with a high rate of meningitis should be immunized. A first-year college student up through age 41 years who is living in a residence hall should receive a dose if he did not receive a dose on or after his 16th birthday. Adults who have certain high-risk conditions should receive one or more doses of vaccine.  Hepatitis A vaccine. Adults who wish to be protected from this disease, have certain high-risk conditions, work with hepatitis A-infected animals, work in hepatitis A research labs, or travel to or work in countries with a high rate of hepatitis A should be immunized. Adults who were previously unvaccinated and who anticipate close contact with an international adoptee during the first 60 days after arrival in the [place] from a country with a high rate of hepatitis A should be immunized.  Hepatitis B vaccine. Adults should be immunized if they wish to be protected from this disease, have certain high-risk conditions, may be exposed to blood or other infectious body fluids, are household contacts or sex partners of hepatitis B positive people, are clients or workers in certain care facilities, or travel to or work in countries with a high rate of hepatitis B.  Haemophilus influenzae type b (Hib) vaccine. A previously unvaccinated person with asplenia or sickle cell disease or having a  scheduled splenectomy should receive 1 dose of Hib vaccine. Regardless of previous immunization, a recipient of a hematopoietic stem cell transplant should receive a 3-dose series 6-12 months after his successful transplant. Hib vaccine is not recommended for adults with HIV infection. Preventive Service / Frequency Ages 86 to 66  Blood pressure check.** / Every 1 to 2 years.  Lipid and cholesterol check.** / Every 5 years beginning at age 51.  Hepatitis C blood test.** / For any individual with known risks for hepatitis C.  Skin self-exam. / Monthly.  Influenza vaccine. / Every year.  Tetanus, diphtheria, and acellular pertussis (Tdap, Td) vaccine.** / Consult your health care provider. 1 dose of Td every 10 years.  Varicella vaccine.** / Consult your health care provider.  HPV vaccine. / 3 doses over 6 months, if 52 or younger.  Measles, mumps, rubella (MMR) vaccine.** / You need at least 1 dose of MMR if you were born in 1957 or later. You may also need a second dose.  Pneumococcal 13-valent conjugate (PCV13) vaccine.** / Consult your health care provider.  Pneumococcal polysaccharide (PPSV23) vaccine.** / 1 to 2 doses if you smoke cigarettes or if you have certain conditions.  Meningococcal vaccine.** / 1 dose if you are age 51 to 66 years and a Market researcher living in a residence hall, or have one of several medical conditions. You may also need additional booster doses.  Hepatitis A vaccine.** / Consult your health care provider.  Hepatitis B vaccine.** / Consult your health care provider.  Haemophilus influenzae type b (Hib) vaccine.** / Consult your health care provider. Ages 77 to 103  Blood pressure check.** / Every 1 to 2 years.  Lipid and cholesterol check.** / Every 5 years beginning at age 12.  Lung cancer screening. / Every year if you are aged 46-80 years and have a 30-pack-year history of smoking and currently smoke or have quit within the past 15  years. Yearly screening is stopped once you have quit smoking for at least 15 years or develop a  health problem that would prevent you from having lung cancer treatment.  Fecal occult blood test (FOBT) of stool. / Every year beginning at age 59 and continuing until age 20. You may not have to do this test if you get a colonoscopy every 10 years.  Flexible sigmoidoscopy** or colonoscopy.** / Every 5 years for a flexible sigmoidoscopy or every 10 years for a colonoscopy beginning at age 30 and continuing until age 32.  Hepatitis C blood test.** / For all people born from [birth year] through 1965 and any individual with known risks for hepatitis C.  Skin self-exam. / Monthly.  Influenza vaccine. / Every year.  Tetanus, diphtheria, and acellular pertussis (Tdap/Td) vaccine.** / Consult your health care provider. 1 dose of Td every 10 years.  Varicella vaccine.** / Consult your health care provider.  Zoster vaccine.** / 1 dose for adults aged 51 years or older.  Measles, mumps, rubella (MMR) vaccine.** / You need at least 1 dose of MMR if you were born in 1955 or later. You may also need a second dose.  Pneumococcal 13-valent conjugate (PCV13) vaccine.** / Consult your health care provider.  Pneumococcal polysaccharide (PPSV23) vaccine.** / 1 to 2 doses if you smoke cigarettes or if you have certain conditions.  Meningococcal vaccine.** / Consult your health care provider.  Hepatitis A vaccine.** / Consult your health care provider.  Hepatitis B vaccine.** / Consult your health care provider.  Haemophilus influenzae type b (Hib) vaccine.** / Consult your health care provider. Ages 46 and over  Blood pressure check.** / Every 1 to 2 years.  Lipid and cholesterol check.**/ Every 5 years beginning at age 35.  Lung cancer screening. / Every year if you are aged 57-80 years and have a 30-pack-year history of smoking and currently smoke or have quit within the past 15 years. Yearly screening is  stopped once you have quit smoking for at least 15 years or develop a health problem that would prevent you from having lung cancer treatment.  Fecal occult blood test (FOBT) of stool. / Every year beginning at age 42 and continuing until age 78. You may not have to do this test if you get a colonoscopy every 10 years.  Flexible sigmoidoscopy** or colonoscopy.** / Every 5 years for a flexible sigmoidoscopy or every 10 years for a colonoscopy beginning at age 23 and continuing until age 21.  Hepatitis C blood test.** / For all people born from [birth year] through 1963 and any individual with known risks for hepatitis C.  Abdominal aortic aneurysm (AAA) screening.** / A one-time screening for ages 86 to 46 years who are current or former smokers.  Skin self-exam. / Monthly.  Influenza vaccine. / Every year.  Tetanus, diphtheria, and acellular pertussis (Tdap/Td) vaccine.** / 1 dose of Td every 10 years.  Varicella vaccine.** / Consult your health care provider.  Zoster vaccine.** / 1 dose for adults aged 56 years or older.  Pneumococcal 13-valent conjugate (PCV13) vaccine.** / Consult your health care provider.  Pneumococcal polysaccharide (PPSV23) vaccine.** / 1 dose for all adults aged 57 years and older.  Meningococcal vaccine.** / Consult your health care provider.  Hepatitis A vaccine.** / Consult your health care provider.  Hepatitis B vaccine.** / Consult your health care provider.  Haemophilus influenzae type b (Hib) vaccine.** / Consult your health care provider. **Family history and personal history of risk and conditions may change your health care provider's recommendations. Document Released: 04/08/2001 Document Revised: 02/15/2013 Document Reviewed: 07/08/2010 Exit[employer] Patient Information  2015 [employer]. This information is not intended to replace advice given to you by your health care provider. Make sure you discuss any questions you have with your health care  provider.

## 2013-12-24 ENCOUNTER — Other Ambulatory Visit: Payer: Self-pay | Admitting: Family Medicine

## 2014-01-17 ENCOUNTER — Other Ambulatory Visit: Payer: Self-pay | Admitting: Cardiology

## 2014-01-24 DIAGNOSIS — I10 Essential (primary) hypertension: Secondary | ICD-10-CM | POA: Diagnosis not present

## 2014-01-24 DIAGNOSIS — Z79899 Other long term (current) drug therapy: Secondary | ICD-10-CM | POA: Diagnosis not present

## 2014-01-24 DIAGNOSIS — E559 Vitamin D deficiency, unspecified: Secondary | ICD-10-CM | POA: Diagnosis not present

## 2014-01-24 DIAGNOSIS — N183 Chronic kidney disease, stage 3 (moderate): Secondary | ICD-10-CM | POA: Diagnosis not present

## 2014-01-24 DIAGNOSIS — R809 Proteinuria, unspecified: Secondary | ICD-10-CM | POA: Diagnosis not present

## 2014-01-24 DIAGNOSIS — D649 Anemia, unspecified: Secondary | ICD-10-CM | POA: Diagnosis not present

## 2014-01-25 DIAGNOSIS — N183 Chronic kidney disease, stage 3 (moderate): Secondary | ICD-10-CM | POA: Diagnosis not present

## 2014-01-25 DIAGNOSIS — R809 Proteinuria, unspecified: Secondary | ICD-10-CM | POA: Diagnosis not present

## 2014-01-25 DIAGNOSIS — I10 Essential (primary) hypertension: Secondary | ICD-10-CM | POA: Diagnosis not present

## 2014-02-06 ENCOUNTER — Encounter: Payer: Self-pay | Admitting: Family Medicine

## 2014-02-06 ENCOUNTER — Ambulatory Visit (INDEPENDENT_AMBULATORY_CARE_PROVIDER_SITE_OTHER): Payer: Medicare Other | Admitting: Family Medicine

## 2014-02-06 VITALS — BP 134/68 | HR 74 | Temp 97.2°F | Ht 68.0 in | Wt 238.0 lb

## 2014-02-06 DIAGNOSIS — I2510 Atherosclerotic heart disease of native coronary artery without angina pectoris: Secondary | ICD-10-CM

## 2014-02-06 DIAGNOSIS — E1121 Type 2 diabetes mellitus with diabetic nephropathy: Secondary | ICD-10-CM | POA: Diagnosis not present

## 2014-02-06 DIAGNOSIS — I10 Essential (primary) hypertension: Secondary | ICD-10-CM | POA: Diagnosis not present

## 2014-02-06 DIAGNOSIS — E785 Hyperlipidemia, unspecified: Secondary | ICD-10-CM

## 2014-02-06 DIAGNOSIS — R799 Abnormal finding of blood chemistry, unspecified: Secondary | ICD-10-CM

## 2014-02-06 DIAGNOSIS — R7989 Other specified abnormal findings of blood chemistry: Secondary | ICD-10-CM

## 2014-02-06 DIAGNOSIS — E559 Vitamin D deficiency, unspecified: Secondary | ICD-10-CM | POA: Diagnosis not present

## 2014-02-06 DIAGNOSIS — L89312 Pressure ulcer of right buttock, stage 2: Secondary | ICD-10-CM

## 2014-02-06 LAB — POCT CBC
GRANULOCYTE PERCENT: 76.1 % (ref 37–80)
HCT, POC: 44.1 % (ref 43.5–53.7)
Hemoglobin: 14.6 g/dL (ref 14.1–18.1)
LYMPH, POC: 2.7 (ref 0.6–3.4)
MCH: 29.7 pg (ref 27–31.2)
MCHC: 33.1 g/dL (ref 31.8–35.4)
MCV: 89.5 fL (ref 80–97)
MPV: 8.3 fL (ref 0–99.8)
POC Granulocyte: 10 — AB (ref 2–6.9)
POC LYMPH %: 20.8 % (ref 10–50)
Platelet Count, POC: 206 10*3/uL (ref 142–424)
RBC: 4.9 M/uL (ref 4.69–6.13)
RDW, POC: 14 %
WBC: 13.1 10*3/uL — AB (ref 4.6–10.2)

## 2014-02-06 LAB — POCT GLYCOSYLATED HEMOGLOBIN (HGB A1C): Hemoglobin A1C: 7

## 2014-02-06 MED ORDER — SITAGLIPTIN PHOS-METFORMIN HCL 50-500 MG PO TABS: ORAL_TABLET | ORAL | Status: DC

## 2014-02-06 NOTE — Patient Instructions (Addendum)
Medicare Annual Wellness Visit  [facility] and the medical providers at [facility] strive to bring you the best medical care.  In doing so we not only want to address your current medical conditions and concerns but also to detect new conditions early and prevent illness, disease and health-related problems.    Medicare offers a yearly Wellness Visit which allows our clinical staff to assess your need for preventative services including immunizations, lifestyle education, counseling to decrease risk of preventable diseases and screening for fall risk and other medical concerns.    This visit is provided free of charge (no copay) for all Medicare recipients. The clinical pharmacists at [facility] have begun to conduct these Wellness Visits which will also include a thorough review of all your medications.    As you primary medical provider recommend that you make an appointment for your Annual Wellness Visit if you have not done so already this year.  You may set up this appointment before you leave today or you may call back [phone]) and schedule an appointment.  Please make sure when you call that you mention that you are scheduling your Annual Wellness Visit with the clinical pharmacist so that the appointment may be made for the proper length of time.      Continue current medications. Continue good therapeutic lifestyle changes which include good diet and exercise. Fall precautions discussed with patient. If an FOBT was given today- please return it to our front desk. If you are over 34 years old - you may need Prevnar 46 or the adult Pneumonia vaccine.  Flu Shots will be available at our office starting mid- September. Please call and schedule a FLU CLINIC APPOINTMENT.   Please return the FOBT Continue to monitor blood sugars regularly at home If the loose stools continue please get back in touch with [place] Avoid  caffeine Check feet regularly We will give you a new prescription for diabetic shoes because of the high arch the bunions, decreased pulses and the lack of sensation on the plantar surface.

## 2014-02-06 NOTE — Progress Notes (Signed)
Subjective:    Patient ID: Bobby Lamas., male    DOB: 1931-01-12, 80 y.o.   MRN: [id number]  HPI Pt here for follow up and management of chronic medical problems. The patient is doing well overall. He does complain with some problems with his stomach and thinks this is due to the [place]. He was having some loose stools. But these are better. The patient does have some skin breakdown on his buttocks but his wife is monitoring this closely and he says this is improving. He also needs a prescription for some new diabetic shoes. He is pleasant and alert and is in his rolling wheelchair. The patient brings his blood sugars in for review but we are are unable to download them from his i-phone.   Patient Active Problem List   Diagnosis Date Noted  . Chronic diastolic CHF (congestive heart failure) 09/22/2013  . Constipation 07/05/2013  . Cellulitis 01/19/2013  . Abscess or cellulitis of gluteal region 01/19/2013  . UTI (lower urinary tract infection) 01/19/2013  . Urinary incontinence 12/13/2012  . External hemorrhoids with complication 18/84/1660  . Chronic constipation 11/29/2012  . Metabolic syndrome 63/02/6008  . Hemorrhoid prolapse 11/29/2012  . Spina bifida 11/18/2012  . CAD, NATIVE VESSEL 03/08/2009  . Type 2 diabetes mellitus with diabetic nephropathy 10/29/2008  . MALNUTRITION, PROTEIN-CALORIE 10/29/2008  . HYPERLIPIDEMIA 10/29/2008  . HYPOKALEMIA 10/29/2008  . ANEMIA 10/29/2008  . PTSD 10/29/2008  . HYPERTENSION, UNSPECIFIED 10/29/2008  . RENAL INSUFFICIENCY, CHRONIC 10/29/2008   Outpatient Encounter Prescriptions as of 02/06/2014  Medication Sig  . aspirin 81 MG tablet Take 81 mg by mouth daily.    Marland Kitchen buPROPion (WELLBUTRIN XL) 300 MG 24 hr tablet TAKE 1 TABLET EVERY MORNING  . Cholecalciferol (VITAMIN D-3) 5000 UNITS TABS Take 1 tablet by mouth daily.    . Coenzyme Q10 (CO Q-10) 100 MG CAPS Take 1 capsule by mouth 2 (two) times daily.   Marland Kitchen ezetimibe-simvastatin  (VYTORIN) 10-20 MG per tablet TAKE 1 TABLET DAILYqpm  . furosemide (LASIX) 20 MG tablet Take 1 tablet (20 mg total) by mouth every morning.  Marland Kitchen GLIPIZIDE XL 10 MG 24 hr tablet TAKE 1 TABLET DAILY  . metoprolol tartrate (LOPRESSOR) 25 MG tablet TAKE 1 TABLET TWICE A DAY  . Multiple Vitamins-Minerals (PRESERVISION AREDS 2 PO) Take 1 capsule by mouth 2 (two) times daily.  . nitroGLYCERIN (NITROSTAT) 0.4 MG SL tablet Place 1 tablet (0.4 mg total) under the tongue every 5 (five) minutes as needed.  . NON FORMULARY cran-actin 129m 1 tablet daily  . Omega-3 Fatty Acids (FISH OIL) 1000 MG CAPS Take 1 capsule by mouth 2 (two) times daily.    . polyethylene glycol powder (GLYCOLAX/MIRALAX) powder Take 17 g by mouth 2 (two) times daily as needed.  . sitaGLIPtin-metformin (JANUMET) 50-500 MG per tablet Take 1 tablet by mouth once daily with food    Review of Systems  Constitutional: Negative.   HENT: Negative.   Eyes: Negative.   Respiratory: Negative.   Cardiovascular: Negative.   Gastrointestinal: Negative.        Had Some GI upset - thinks it was related to janumet  Endocrine: Negative.   Genitourinary: Negative.   Musculoskeletal: Negative.   Skin: Negative.   Allergic/Immunologic: Negative.   Neurological: Negative.   Hematological: Negative.   Psychiatric/Behavioral: Negative.        Objective:   Physical Exam  Constitutional: He is oriented to person, place, and time. He appears well-developed and well-nourished.  No distress.  The patient is pleasant and alert. He brings in his eye fine with his blood sugars over the past month and these have been averaging in a good range.  HENT:  Head: Normocephalic and atraumatic.  Right Ear: External ear normal.  Left Ear: External ear normal.  Nose: Nose normal.  Mouth/Throat: Oropharynx is clear and moist. No oropharyngeal exudate.  Eyes: Conjunctivae and EOM are normal. Pupils are equal, round, and reactive to light. Right eye exhibits no  discharge. Left eye exhibits no discharge. No scleral icterus.  Neck: Normal range of motion. Neck supple. No thyromegaly present.  No carotid bruits or anterior cervical adenopathy  Cardiovascular: Normal rate, regular rhythm and normal heart sounds.   No murmur heard. At 72/m  Pulmonary/Chest: Effort normal and breath sounds normal. No respiratory distress. He has no wheezes. He has no rales. He exhibits no tenderness.  Abdominal: Soft. Bowel sounds are normal. He exhibits no mass. There is no tenderness. There is no rebound and no guarding.  The abdomen is obese without tenderness or masses or bruits  Musculoskeletal: Normal range of motion. He exhibits no edema or tenderness.  The patient's range of motion with his lower extremities is limited due to his spinal bifida. He has high arches bilaterally. His sensation is diminished on the plantar surfaces of both feet but present on the dorsal surfaces of both feet. I was unable to palpate any of the pulses in his feet. He does have bilateral bunions.  Lymphadenopathy:    He has no cervical adenopathy.  Neurological: He is alert and oriented to person, place, and time. He has normal reflexes. No cranial nerve deficit.  Decreased sensation plantar surfaces of both feet and patient has spina bifida.  Skin: Skin is warm and dry. No rash noted. No erythema. No pallor.  The patient has skin breakdown on both buttocks right being worse than the left. These areas do have slight drainage and there is minimal erythema.  Psychiatric: He has a normal mood and affect. His behavior is normal. Judgment and thought content normal.  Nursing note and vitals reviewed.  BP 134/68 mmHg  Pulse 74  Temp(Src) 97.2 F (36.2 C) (Oral)  Ht _0  (1.727 m)  Wt 238 lb (107.956 kg)  BMI 36.20 kg/m2        Assessment & Plan:  1. Type 2 diabetes mellitus with diabetic nephropathy - POCT CBC - POCT glycosylated hemoglobin (Hb A1C)  2. Vitamin D deficiency -  POCT CBC - Vit D  25 hydroxy (rtn osteoporosis monitoring)  3. Essential hypertension - POCT CBC - BMP8+EGFR - Hepatic function panel  4. Hyperlipemia - POCT CBC - NMR, lipoprofile  5. Buttock decubitus, right more prominent than left  Patient Instructions                       Medicare Annual Wellness Visit  [facility] and the medical providers at [facility] strive to bring you the best medical care.  In doing so we not only want to address your current medical conditions and concerns but also to detect new conditions early and prevent illness, disease and health-related problems.    Medicare offers a yearly Wellness Visit which allows our clinical staff to assess your need for preventative services including immunizations, lifestyle education, counseling to decrease risk of preventable diseases and screening for fall risk and other medical concerns.    This visit is provided free of  charge (no copay) for all Medicare recipients. The clinical pharmacists at [facility] have begun to conduct these Wellness Visits which will also include a thorough review of all your medications.    As you primary medical provider recommend that you make an appointment for your Annual Wellness Visit if you have not done so already this year.  You may set up this appointment before you leave today or you may call back ([phone]) and schedule an appointment.  Please make sure when you call that you mention that you are scheduling your Annual Wellness Visit with the clinical pharmacist so that the appointment may be made for the proper length of time.      Continue current medications. Continue good therapeutic lifestyle changes which include good diet and exercise. Fall precautions discussed with patient. If an FOBT was given today- please return it to our front desk. If you are over 40 years old - you may need Prevnar 46 or the adult Pneumonia  vaccine.  Flu Shots will be available at our office starting mid- September. Please call and schedule a FLU CLINIC APPOINTMENT.   Please return the FOBT Continue to monitor blood sugars regularly at home If the loose stools continue please get back in touch with [place] Avoid caffeine Check feet regularly We will give you a new prescription for diabetic shoes because of the high arch the bunions, decreased pulses and the lack of sensation on the plantar surface.   Arrie Senate MD

## 2014-02-07 ENCOUNTER — Telehealth: Payer: Self-pay | Admitting: *Deleted

## 2014-02-07 LAB — BMP8+EGFR
BUN/Creatinine Ratio: 14 (ref 10–22)
BUN: 26 mg/dL (ref 8–27)
CO2: 23 mmol/L (ref 18–29)
Calcium: 9.4 mg/dL (ref 8.6–10.2)
Chloride: 100 mmol/L (ref 97–108)
Creatinine, Ser: 1.88 mg/dL — ABNORMAL HIGH (ref 0.76–1.27)
GFR calc Af Amer: 38 mL/min/{1.73_m2} — ABNORMAL LOW (ref >59)
GFR calc non Af Amer: 33 mL/min/{1.73_m2} — ABNORMAL LOW (ref >59)
Glucose: 174 mg/dL — ABNORMAL HIGH (ref 65–99)
Potassium: 5.1 mmol/L (ref 3.5–5.2)
Sodium: 139 mmol/L (ref 134–144)

## 2014-02-07 LAB — NMR, LIPOPROFILE
Cholesterol: 149 mg/dL (ref 100–199)
HDL Cholesterol by NMR: 45 mg/dL (ref >39)
HDL PARTICLE NUMBER: 34.6 umol/L (ref >=30.5)
LDL Particle Number: 1397 nmol/L — ABNORMAL HIGH (ref <1000)
LDL Size: 20 nm (ref >20.5)
LDL-C: 77 mg/dL (ref 0–99)
LP-IR SCORE: 53 — AB (ref <=45)
SMALL LDL PARTICLE NUMBER: 970 nmol/L — AB (ref <=527)
Triglycerides by NMR: 134 mg/dL (ref 0–149)

## 2014-02-07 LAB — VITAMIN D 25 HYDROXY (VIT D DEFICIENCY, FRACTURES): Vit D, 25-Hydroxy: 37.1 ng/mL (ref 30.0–100.0)

## 2014-02-07 LAB — HEPATIC FUNCTION PANEL
ALBUMIN: 4.3 g/dL (ref 3.5–4.7)
ALK PHOS: 79 IU/L (ref 39–117)
ALT: 13 IU/L (ref 0–44)
AST: 17 IU/L (ref 0–40)
BILIRUBIN TOTAL: 0.6 mg/dL (ref 0.0–1.2)
Bilirubin, Direct: 0.21 mg/dL (ref 0.00–0.40)
Total Protein: 6.6 g/dL (ref 6.0–8.5)

## 2014-02-07 NOTE — Telephone Encounter (Signed)
-----   Message from Chipper Herb, MD sent at 02/07/2014  7:27 AM EST ----- The blood sugars elevated at 174. Creatinine is higher than it has been in the past at 1.88. All of the electrolytes including potassium are within normal limits. Please recheck a BMP in 6 weeks. If this number continues to rise the patient will need to have an appointment with a nephrologist for further evaluation and management. Please remind him to avoid all NSAIDs. All liver function tests are within normal limits Cholesterol numbers with advanced lipid testing have a total LDL particle number that is higher than in the past at 1397 previously it was 1001. The LDL C is good at 77 good cholesterol or the HDL particle number is also good.------ please make sure that the patient is taking his cholesterol medicine regularly and that he is watching his diet as closely as possible and getting as much exercise as he can tolerate. The vitamin D level is within normal limits continue current treatment

## 2014-02-07 NOTE — Telephone Encounter (Signed)
Aware of results and need for repeat BMP in 6 weeks.

## 2014-02-15 ENCOUNTER — Other Ambulatory Visit: Payer: Self-pay | Admitting: Family Medicine

## 2014-02-17 ENCOUNTER — Other Ambulatory Visit: Payer: Self-pay | Admitting: Cardiology

## 2014-02-22 ENCOUNTER — Other Ambulatory Visit: Payer: Self-pay | Admitting: Family Medicine

## 2014-02-27 DIAGNOSIS — M5137 Other intervertebral disc degeneration, lumbosacral region: Secondary | ICD-10-CM | POA: Diagnosis not present

## 2014-02-27 DIAGNOSIS — M9902 Segmental and somatic dysfunction of thoracic region: Secondary | ICD-10-CM | POA: Diagnosis not present

## 2014-02-27 DIAGNOSIS — M9903 Segmental and somatic dysfunction of lumbar region: Secondary | ICD-10-CM | POA: Diagnosis not present

## 2014-02-27 DIAGNOSIS — M9901 Segmental and somatic dysfunction of cervical region: Secondary | ICD-10-CM | POA: Diagnosis not present

## 2014-02-28 DIAGNOSIS — B351 Tinea unguium: Secondary | ICD-10-CM | POA: Diagnosis not present

## 2014-02-28 DIAGNOSIS — E1151 Type 2 diabetes mellitus with diabetic peripheral angiopathy without gangrene: Secondary | ICD-10-CM | POA: Diagnosis not present

## 2014-02-28 DIAGNOSIS — L84 Corns and callosities: Secondary | ICD-10-CM | POA: Diagnosis not present

## 2014-03-07 DIAGNOSIS — M9902 Segmental and somatic dysfunction of thoracic region: Secondary | ICD-10-CM | POA: Diagnosis not present

## 2014-03-07 DIAGNOSIS — M9901 Segmental and somatic dysfunction of cervical region: Secondary | ICD-10-CM | POA: Diagnosis not present

## 2014-03-07 DIAGNOSIS — M5137 Other intervertebral disc degeneration, lumbosacral region: Secondary | ICD-10-CM | POA: Diagnosis not present

## 2014-03-07 DIAGNOSIS — M9903 Segmental and somatic dysfunction of lumbar region: Secondary | ICD-10-CM | POA: Diagnosis not present

## 2014-03-09 ENCOUNTER — Encounter (HOSPITAL_COMMUNITY): Payer: Medicare Other

## 2014-03-09 ENCOUNTER — Other Ambulatory Visit: Payer: Self-pay | Admitting: Family Medicine

## 2014-03-10 ENCOUNTER — Telehealth: Payer: Self-pay | Admitting: Family Medicine

## 2014-03-10 MED ORDER — DOXYCYCLINE HYCLATE 100 MG PO CAPS: 100.0000 mg | ORAL_CAPSULE | Freq: Two times a day (BID) | ORAL | Status: DC

## 2014-03-10 NOTE — Telephone Encounter (Signed)
Patient aware rx sent into pharmacy.

## 2014-03-10 NOTE — Telephone Encounter (Signed)
Patient is complaining with burning when urinates, he states that his urine is cloudy and has a strong odor.

## 2014-03-10 NOTE — Telephone Encounter (Signed)
Doxy I sent the Rx to the pharmacy.

## 2014-03-13 DIAGNOSIS — M9903 Segmental and somatic dysfunction of lumbar region: Secondary | ICD-10-CM | POA: Diagnosis not present

## 2014-03-13 DIAGNOSIS — M9902 Segmental and somatic dysfunction of thoracic region: Secondary | ICD-10-CM | POA: Diagnosis not present

## 2014-03-13 DIAGNOSIS — M9901 Segmental and somatic dysfunction of cervical region: Secondary | ICD-10-CM | POA: Diagnosis not present

## 2014-03-13 DIAGNOSIS — M5137 Other intervertebral disc degeneration, lumbosacral region: Secondary | ICD-10-CM | POA: Diagnosis not present

## 2014-03-27 DIAGNOSIS — M9901 Segmental and somatic dysfunction of cervical region: Secondary | ICD-10-CM | POA: Diagnosis not present

## 2014-03-27 DIAGNOSIS — M9903 Segmental and somatic dysfunction of lumbar region: Secondary | ICD-10-CM | POA: Diagnosis not present

## 2014-03-27 DIAGNOSIS — M5137 Other intervertebral disc degeneration, lumbosacral region: Secondary | ICD-10-CM | POA: Diagnosis not present

## 2014-03-27 DIAGNOSIS — M9902 Segmental and somatic dysfunction of thoracic region: Secondary | ICD-10-CM | POA: Diagnosis not present

## 2014-03-30 ENCOUNTER — Encounter (INDEPENDENT_AMBULATORY_CARE_PROVIDER_SITE_OTHER): Payer: Self-pay

## 2014-03-30 ENCOUNTER — Encounter: Payer: Self-pay | Admitting: Family Medicine

## 2014-03-30 ENCOUNTER — Ambulatory Visit (INDEPENDENT_AMBULATORY_CARE_PROVIDER_SITE_OTHER): Payer: Medicare Other | Admitting: Family Medicine

## 2014-03-30 VITALS — BP 125/73 | HR 78 | Temp 97.5°F | Ht 68.0 in | Wt 240.0 lb

## 2014-03-30 DIAGNOSIS — L989 Disorder of the skin and subcutaneous tissue, unspecified: Secondary | ICD-10-CM

## 2014-03-30 DIAGNOSIS — H9190 Unspecified hearing loss, unspecified ear: Secondary | ICD-10-CM | POA: Diagnosis not present

## 2014-03-30 DIAGNOSIS — R300 Dysuria: Secondary | ICD-10-CM

## 2014-03-30 LAB — POCT URINALYSIS DIPSTICK
BILIRUBIN UA: NEGATIVE
Glucose, UA: NEGATIVE
KETONES UA: NEGATIVE
NITRITE UA: POSITIVE
Protein, UA: NEGATIVE
SPEC GRAV UA: 1.01
Urobilinogen, UA: NEGATIVE
pH, UA: 5

## 2014-03-30 LAB — POCT UA - MICROSCOPIC ONLY
Casts, Ur, LPF, POC: NEGATIVE
Casts, Ur, LPF, POC: NEGATIVE
Crystals, Ur, HPF, POC: NEGATIVE
Crystals, Ur, HPF, POC: NEGATIVE
MUCUS UA: NEGATIVE
MUCUS UA: NEGATIVE
YEAST UA: NEGATIVE
Yeast, UA: NEGATIVE

## 2014-03-30 MED ORDER — DOXYCYCLINE HYCLATE 100 MG PO CAPS
100.0000 mg | ORAL_CAPSULE | Freq: Two times a day (BID) | ORAL | Status: DC
Start: 2014-03-30 — End: 2014-05-02

## 2014-03-30 NOTE — Progress Notes (Signed)
Subjective:    Patient ID: [name]., male    DOB: Jun 08, 1930, 81 y.o.   MRN: [id number]  HPI Patient here today for 3 reasons. One being that he needs a form filled out [facility] the [facility]. He also has some dysuria and a new skin lesion to his left cheek. The dysuria is better and the symptoms he had when he started the doxycycline have improved he is been taking the medication regularly but has run out. He is taking all of his other medications regularly and overall is doing pretty well. The patient also is requesting a referral to the audiologist next-door for hearing aid for himself and his wife. The hearing problems have been going on for some time. The skin lesion has come up quickly.          Patient Active Problem List   Diagnosis Date Noted  . Chronic diastolic CHF (congestive heart failure) 09/22/2013  . Constipation 07/05/2013  . Cellulitis 01/19/2013  . Abscess or cellulitis of gluteal region 01/19/2013  . UTI (lower urinary tract infection) 01/19/2013  . Urinary incontinence 12/13/2012  . External hemorrhoids with complication 99991111  . Chronic constipation 11/29/2012  . Metabolic syndrome [id number]  . Hemorrhoid prolapse 11/29/2012  . Spina bifida 11/18/2012  . CAD, NATIVE VESSEL 03/08/2009  . Type 2 diabetes mellitus with diabetic nephropathy 10/29/2008  . MALNUTRITION, PROTEIN-CALORIE 10/29/2008  . HYPERLIPIDEMIA 10/29/2008  . HYPOKALEMIA 10/29/2008  . ANEMIA 10/29/2008  . PTSD 10/29/2008  . HYPERTENSION, UNSPECIFIED 10/29/2008  . RENAL INSUFFICIENCY, CHRONIC 10/29/2008   Outpatient Encounter Prescriptions as of 03/30/2014  Medication Sig  . aspirin 81 MG tablet Take 81 mg by mouth daily.    Marland Kitchen buPROPion (WELLBUTRIN XL) 300 MG 24 hr tablet TAKE 1 TABLET EVERY MORNING  . Cholecalciferol (VITAMIN D-3) 5000 UNITS TABS Take 1 tablet by mouth daily.    . Coenzyme Q10 (CO Q-10) 100 MG CAPS Take 1 capsule by mouth 2 (two) times daily.   Marland Kitchen  ezetimibe-simvastatin (VYTORIN) 10-20 MG per tablet TAKE 1 TABLET DAILYqpm  . furosemide (LASIX) 20 MG tablet Take 1 tablet (20 mg total) by mouth every morning.  Marland Kitchen GLIPIZIDE XL 10 MG 24 hr tablet TAKE 1 TABLET DAILY  . metoprolol tartrate (LOPRESSOR) 25 MG tablet TAKE 1 TABLET TWICE A DAY  . Multiple Vitamins-Minerals (PRESERVISION AREDS 2 PO) Take 1 capsule by mouth 2 (two) times daily.  . nitroGLYCERIN (NITROSTAT) 0.4 MG SL tablet Place 1 tablet (0.4 mg total) under the tongue every 5 (five) minutes as needed.  . NON FORMULARY cran-actin 100mg  1 tablet daily  . Omega-3 Fatty Acids (FISH OIL) 1000 MG CAPS Take 1 capsule by mouth 2 (two) times daily.    . polyethylene glycol powder (GLYCOLAX/MIRALAX) powder Take 17 g by mouth 2 (two) times daily as needed.  . sitaGLIPtin-metformin (JANUMET) 50-500 MG per tablet Take 1 tablet by mouth once daily with food  . [DISCONTINUED] doxycycline (VIBRAMYCIN) 100 MG capsule Take 1 capsule (100 mg total) by mouth 2 (two) times daily.  . [DISCONTINUED] VYTORIN 10-20 MG per tablet TAKE 1 TABLET DAILY AS DIRECTED    Review of Systems  Constitutional: Negative.   HENT: Negative.   Eyes: Negative.   Respiratory: Negative.   Cardiovascular: Negative.   Gastrointestinal: Negative.   Endocrine: Negative.   Genitourinary: Positive for dysuria.  Musculoskeletal: Negative.   Skin: Negative.        New onset -skin lesion - left cheek.  Allergic/Immunologic: Negative.   Neurological: Negative.   Hematological: Negative.   Psychiatric/Behavioral: Negative.        Objective:   Physical Exam  Constitutional: He is oriented to person, place, and time. He appears well-developed and well-nourished. No distress.  HENT:  Head: Normocephalic and atraumatic.  Eyes: Conjunctivae and EOM are normal. Pupils are equal, round, and reactive to light. Right eye exhibits no discharge. Left eye exhibits no discharge. No scleral icterus.  Abdominal: Soft. He exhibits no  distension and no mass. There is no tenderness. There is no rebound and no guarding.  Musculoskeletal:  The patient is in a rolling wheelchair which she uses for ambulation when he is on his feet. He has good strength with flexion extension and full range of motion of both upper and lower extremities. There is no pain or discomfort with any movement of any of his extremities. There is no edema.  Neurological: He is alert and oriented to person, place, and time.  Skin: Skin is warm and dry. No rash noted. No erythema. No pallor.  The patient has what appears to be a skin cancer of his left cheek and will need to have this excised by a dermatologist  Psychiatric: He has a normal mood and affect. His behavior is normal. Judgment and thought content normal.  Nursing note and vitals reviewed.  BP 125/73 mmHg  Pulse 78  Temp(Src) 97.5 F (36.4 C) (Oral)  Ht 5\' 8"  (1.727 m)  Wt 240 lb (108.863 kg)  BMI 36.50 kg/m2 Results for orders placed or performed in visit on 03/30/14  POCT urinalysis dipstick  Result Value Ref Range   Color, UA gold    Clarity, UA cloudy    Glucose, UA neg    Bilirubin, UA neg    Ketones, UA neg    Spec Grav, UA 1.010    Blood, UA trace    pH, UA 5.0    Protein, UA neg    Urobilinogen, UA negative    Nitrite, UA positive    Leukocytes, UA large (3+)   POCT UA - Microscopic Only  Result Value Ref Range   WBC, Ur, HPF, POC 20-30    RBC, urine, microscopic 1-5    Bacteria, U Microscopic many    Mucus, UA neg    Epithelial cells, urine per micros rare    Crystals, Ur, HPF, POC neg    Casts, Ur, LPF, POC neg    Yeast, UA neg           Assessment & Plan:  1. Dysuria -This has improved but the urine still has infection and we will await the culture and sensitivity before we decide about any additional treatment other than continuing what he has been on. - POCT urinalysis dipstick - POCT UA - Microscopic Only - Urine culture  2. HOH (hard of hearing),  unspecified laterality -At the patient's request we are arranging for him to see an audiologist for evaluation for hearing aids - Ambulatory referral to Audiology  3. Skin lesion of face -This is an atypical lesion the size of about 1/4 inch in diameter with crusting in the center and he will be referred for excision of this to the dermatologist - Ambulatory referral to Dermatology  Meds ordered this encounter  Medications  . doxycycline (VIBRAMYCIN) 100 MG capsule    Sig: Take 1 capsule (100 mg total) by mouth 2 (two) times daily.    Dispense:  14 capsule    Refill:  0   Patient Instructions  We will arrange for you to have your hearing evaluated next-door to see if hearing aids would help you improve your hearing. Because of the infection in your urine and we will do another culture and sensitivity and while we are waiting for that to come back we would want you to continue to take the doxycycline twice daily with food The DMV form was completed and all that you need to do is have your eye doctor complete his section. Because of the suspicious skin lesion on your left cheek we will make a referral to [facility] dermatology to have this removed   Arrie Senate MD

## 2014-03-30 NOTE — Patient Instructions (Signed)
We will arrange for you to have your hearing evaluated next-door to see if hearing aids would help you improve your hearing. Because of the infection in your urine and we will do another culture and sensitivity and while we are waiting for that to come back we would want you to continue to take the doxycycline twice daily with food The DMV form was completed and all that you need to do is have your eye doctor complete his section. Because of the suspicious skin lesion on your left cheek we will make a referral to [facility] dermatology to have this removed

## 2014-03-31 DIAGNOSIS — H5010 Unspecified exotropia: Secondary | ICD-10-CM | POA: Diagnosis not present

## 2014-03-31 DIAGNOSIS — H3531 Nonexudative age-related macular degeneration: Secondary | ICD-10-CM | POA: Diagnosis not present

## 2014-04-01 LAB — URINE CULTURE

## 2014-04-03 ENCOUNTER — Other Ambulatory Visit: Payer: Self-pay | Admitting: *Deleted

## 2014-04-03 DIAGNOSIS — M9901 Segmental and somatic dysfunction of cervical region: Secondary | ICD-10-CM | POA: Diagnosis not present

## 2014-04-03 DIAGNOSIS — M5137 Other intervertebral disc degeneration, lumbosacral region: Secondary | ICD-10-CM | POA: Diagnosis not present

## 2014-04-03 DIAGNOSIS — M9902 Segmental and somatic dysfunction of thoracic region: Secondary | ICD-10-CM | POA: Diagnosis not present

## 2014-04-03 DIAGNOSIS — M9903 Segmental and somatic dysfunction of lumbar region: Secondary | ICD-10-CM | POA: Diagnosis not present

## 2014-04-03 MED ORDER — AMOXICILLIN-POT CLAVULANATE 875-125 MG PO TABS: 1.0000 | ORAL_TABLET | Freq: Two times a day (BID) | ORAL | Status: DC

## 2014-04-07 ENCOUNTER — Encounter (HOSPITAL_BASED_OUTPATIENT_CLINIC_OR_DEPARTMENT_OTHER): Payer: Medicare Other | Attending: Internal Medicine

## 2014-04-07 DIAGNOSIS — L89312 Pressure ulcer of right buttock, stage 2: Secondary | ICD-10-CM | POA: Insufficient documentation

## 2014-04-07 DIAGNOSIS — L89322 Pressure ulcer of left buttock, stage 2: Secondary | ICD-10-CM | POA: Insufficient documentation

## 2014-04-12 DIAGNOSIS — M9902 Segmental and somatic dysfunction of thoracic region: Secondary | ICD-10-CM | POA: Diagnosis not present

## 2014-04-12 DIAGNOSIS — M9903 Segmental and somatic dysfunction of lumbar region: Secondary | ICD-10-CM | POA: Diagnosis not present

## 2014-04-12 DIAGNOSIS — M9901 Segmental and somatic dysfunction of cervical region: Secondary | ICD-10-CM | POA: Diagnosis not present

## 2014-04-12 DIAGNOSIS — M5137 Other intervertebral disc degeneration, lumbosacral region: Secondary | ICD-10-CM | POA: Diagnosis not present

## 2014-04-13 DIAGNOSIS — L89312 Pressure ulcer of right buttock, stage 2: Secondary | ICD-10-CM | POA: Diagnosis not present

## 2014-04-13 DIAGNOSIS — L89322 Pressure ulcer of left buttock, stage 2: Secondary | ICD-10-CM | POA: Diagnosis not present

## 2014-04-14 NOTE — Progress Notes (Signed)
[facility]  NAME:  [name], [name] NO.:  MEDICAL RECORD NO.:  [id number]      DATE OF BIRTH:  Apr 07, 1930  PHYSICIAN:  Ricard Dillon, M.D. VISIT DATE:  04/13/2014                                  OFFICE VISIT   CHIEF COMPLAINT/HISTORY:  This is a gentleman who comes to [place] for review of bilateral wounds on his buttocks over his ischial tuberosities.  He was previously here in 2014 with issues in the same area.  At that point in time, he developed wounds that were initially treated in [place] in [place].  He then developed cellulitis and had a marked deterioration.  He required IV antibiotics.  Currently, he tells me, over the last 2 to 3 months, he developed small open areas that have gradually enlarged, he has been treating these with I believe some form of wound care gel that is silicone based.  In any case, this has not improved.  He has not been systemically unwell.  He can walk.  He is somewhat immobile, I think, he uses a walker.  PAST MEDICAL HISTORY:  Congestive heart failure, UTIs, urinary incontinence, spina bifida, coronary artery disease, type 2 diabetes, hyperlipidemia, hypokalemia, anemia, post traumatic stress disorder, hypertension, renal insufficiency, and vitamin D deficiency.  PREVIOUS SURGERIES: 1. Spina bifida. 2. Coronary artery bypass surgery.  CURRENT MEDICATIONS:  Reviewed include aspirin 81 daily, Wellbutrin 300 daily, vitamin D3 at 5000 units daily, Vytorin 10/30, Lasix 20 daily, glipizide XL 10 daily, Lopressor 25 daily, Nitrostat 0.4 sublingual p.r.n., fish oil a 1000 b.i.d., MiraLAX 17 g b.i.d., Janumet 50/500 daily.  PHYSICAL EXAMINATION:  VITAL SIGNS:  Temperature is 97.9, pulse 74, respirations 16, blood pressure 120/52.  CBG is 153. WOUND EXAM:  The areas in question are over his bilateral ischial tuberosity, left greater than right.  Both of these were covered with a surface eschar  and required debridement with a #15 scalpel.  There is no evidence of infection.  No erythema.  Certainly, no evidence of cellulitis at this time.  IMPRESSIONS:  Stage II pressure ulcerations, left greater than right, both of these required debridement.  This is similar to what the situation was when he was last here.  The surface appears visually to be healthy, however, there is gritty surface to this which is evident with debridement.  We dressed these areas with Santyl, covered with a gauze.  We talked in detail about pressure relief.  He is familiar with this from his last visit here.  We will see him again in 2 weeks' time.          ______________________________ Ricard Dillon, M.D.     MGR/MEDQ  D:  04/13/2014  T:  04/14/2014  Job:  [id number]

## 2014-04-17 DIAGNOSIS — M9901 Segmental and somatic dysfunction of cervical region: Secondary | ICD-10-CM | POA: Diagnosis not present

## 2014-04-17 DIAGNOSIS — M9903 Segmental and somatic dysfunction of lumbar region: Secondary | ICD-10-CM | POA: Diagnosis not present

## 2014-04-17 DIAGNOSIS — M5137 Other intervertebral disc degeneration, lumbosacral region: Secondary | ICD-10-CM | POA: Diagnosis not present

## 2014-04-17 DIAGNOSIS — M9902 Segmental and somatic dysfunction of thoracic region: Secondary | ICD-10-CM | POA: Diagnosis not present

## 2014-04-24 DIAGNOSIS — M9902 Segmental and somatic dysfunction of thoracic region: Secondary | ICD-10-CM | POA: Diagnosis not present

## 2014-04-24 DIAGNOSIS — M9903 Segmental and somatic dysfunction of lumbar region: Secondary | ICD-10-CM | POA: Diagnosis not present

## 2014-04-24 DIAGNOSIS — M9901 Segmental and somatic dysfunction of cervical region: Secondary | ICD-10-CM | POA: Diagnosis not present

## 2014-04-24 DIAGNOSIS — M5137 Other intervertebral disc degeneration, lumbosacral region: Secondary | ICD-10-CM | POA: Diagnosis not present

## 2014-04-27 ENCOUNTER — Encounter (HOSPITAL_BASED_OUTPATIENT_CLINIC_OR_DEPARTMENT_OTHER): Payer: Medicare Other | Attending: Internal Medicine

## 2014-04-27 DIAGNOSIS — L89312 Pressure ulcer of right buttock, stage 2: Secondary | ICD-10-CM | POA: Insufficient documentation

## 2014-04-27 DIAGNOSIS — L89322 Pressure ulcer of left buttock, stage 2: Secondary | ICD-10-CM | POA: Insufficient documentation

## 2014-05-01 ENCOUNTER — Other Ambulatory Visit: Payer: Self-pay | Admitting: Cardiology

## 2014-05-01 DIAGNOSIS — M9904 Segmental and somatic dysfunction of sacral region: Secondary | ICD-10-CM | POA: Diagnosis not present

## 2014-05-01 DIAGNOSIS — M5430 Sciatica, unspecified side: Secondary | ICD-10-CM | POA: Diagnosis not present

## 2014-05-01 DIAGNOSIS — N183 Chronic kidney disease, stage 3 (moderate): Secondary | ICD-10-CM | POA: Diagnosis not present

## 2014-05-01 DIAGNOSIS — M9902 Segmental and somatic dysfunction of thoracic region: Secondary | ICD-10-CM | POA: Diagnosis not present

## 2014-05-01 DIAGNOSIS — M9903 Segmental and somatic dysfunction of lumbar region: Secondary | ICD-10-CM | POA: Diagnosis not present

## 2014-05-01 DIAGNOSIS — R809 Proteinuria, unspecified: Secondary | ICD-10-CM | POA: Diagnosis not present

## 2014-05-01 DIAGNOSIS — D649 Anemia, unspecified: Secondary | ICD-10-CM | POA: Diagnosis not present

## 2014-05-01 DIAGNOSIS — E559 Vitamin D deficiency, unspecified: Secondary | ICD-10-CM | POA: Diagnosis not present

## 2014-05-01 DIAGNOSIS — I10 Essential (primary) hypertension: Secondary | ICD-10-CM | POA: Diagnosis not present

## 2014-05-01 DIAGNOSIS — Z79899 Other long term (current) drug therapy: Secondary | ICD-10-CM | POA: Diagnosis not present

## 2014-05-02 ENCOUNTER — Telehealth: Payer: Self-pay | Admitting: Family Medicine

## 2014-05-02 DIAGNOSIS — C44329 Squamous cell carcinoma of skin of other parts of face: Secondary | ICD-10-CM | POA: Diagnosis not present

## 2014-05-02 MED ORDER — AMOXICILLIN-POT CLAVULANATE 875-125 MG PO TABS: 1.0000 | ORAL_TABLET | Freq: Two times a day (BID) | ORAL | Status: DC

## 2014-05-02 NOTE — Telephone Encounter (Signed)
Pt aware and med sent in  

## 2014-05-02 NOTE — Telephone Encounter (Signed)
Please call in Augmentin [phone] twice daily with food for 10 days, recheck urinalysis at that time.

## 2014-05-02 NOTE — Telephone Encounter (Signed)
Dysuria and cloudy, last time we had to switch him to Augment 875 to clear the E-coli infection.  [id number]   FYI Dr Elvera Lennox visit was today and went very well, he was "a super nice guy"

## 2014-05-03 ENCOUNTER — Other Ambulatory Visit: Payer: Self-pay | Admitting: Family Medicine

## 2014-05-03 DIAGNOSIS — E1129 Type 2 diabetes mellitus with other diabetic kidney complication: Secondary | ICD-10-CM | POA: Diagnosis not present

## 2014-05-03 DIAGNOSIS — M5430 Sciatica, unspecified side: Secondary | ICD-10-CM | POA: Diagnosis not present

## 2014-05-03 DIAGNOSIS — I10 Essential (primary) hypertension: Secondary | ICD-10-CM | POA: Diagnosis not present

## 2014-05-03 DIAGNOSIS — M9904 Segmental and somatic dysfunction of sacral region: Secondary | ICD-10-CM | POA: Diagnosis not present

## 2014-05-03 DIAGNOSIS — E872 Acidosis: Secondary | ICD-10-CM | POA: Diagnosis not present

## 2014-05-03 DIAGNOSIS — R809 Proteinuria, unspecified: Secondary | ICD-10-CM | POA: Diagnosis not present

## 2014-05-03 DIAGNOSIS — M9902 Segmental and somatic dysfunction of thoracic region: Secondary | ICD-10-CM | POA: Diagnosis not present

## 2014-05-03 DIAGNOSIS — M9903 Segmental and somatic dysfunction of lumbar region: Secondary | ICD-10-CM | POA: Diagnosis not present

## 2014-05-03 DIAGNOSIS — N183 Chronic kidney disease, stage 3 (moderate): Secondary | ICD-10-CM | POA: Diagnosis not present

## 2014-05-08 DIAGNOSIS — M9904 Segmental and somatic dysfunction of sacral region: Secondary | ICD-10-CM | POA: Diagnosis not present

## 2014-05-08 DIAGNOSIS — M9903 Segmental and somatic dysfunction of lumbar region: Secondary | ICD-10-CM | POA: Diagnosis not present

## 2014-05-08 DIAGNOSIS — M5430 Sciatica, unspecified side: Secondary | ICD-10-CM | POA: Diagnosis not present

## 2014-05-08 DIAGNOSIS — M9902 Segmental and somatic dysfunction of thoracic region: Secondary | ICD-10-CM | POA: Diagnosis not present

## 2014-05-11 ENCOUNTER — Ambulatory Visit (INDEPENDENT_AMBULATORY_CARE_PROVIDER_SITE_OTHER): Payer: Medicare Other | Admitting: Cardiology

## 2014-05-11 ENCOUNTER — Encounter: Payer: Self-pay | Admitting: Cardiology

## 2014-05-11 VITALS — BP 126/62 | HR 71 | Ht 68.0 in | Wt 243.0 lb

## 2014-05-11 DIAGNOSIS — N183 Chronic kidney disease, stage 3 unspecified: Secondary | ICD-10-CM

## 2014-05-11 DIAGNOSIS — I5032 Chronic diastolic (congestive) heart failure: Secondary | ICD-10-CM

## 2014-05-11 DIAGNOSIS — I2510 Atherosclerotic heart disease of native coronary artery without angina pectoris: Secondary | ICD-10-CM

## 2014-05-11 NOTE — Progress Notes (Signed)
Patient ID: Bobby Barrozo., male   DOB: Mar 16, 1930, 81 y.o.   MRN: [id number] PCP: Dr. Laurance Flatten  81 yo with history of CAD s/p CABG and chronic diastolic CHF presents for cardiology followup.  His main problem in the last couple of years has been gluteal pressure ulcers.  These became infected and he has had extensive treatment with antibiotics and wound care.  He was very inactive during this period. They have finally healed. He is somewhat unsteady walking and uses a walker.  No chest pain. Last echo was done in 1/15 and showed EF 50-55% with normal RV size and mildly decreased RV systolic function.  No exertional dyspnea but not particularly active.  No chest pain.  Weight is up 3 lbs.    Labs (9/14): LDL particle number 1377, LDL 74 Labs (11/14): K 4.7,creatinine 1.26 Labs (5/15): K 5.1, creatinine 1.79, LDL 66, LDL-P 1001 Labs (8/15): K 4.6, creatinine 1.48, LDL 75, HDL 52, BNP 45 Labs (12/15): K 5.1, creatinine 1.8, LDL-P 1397, LDL 77  ECG: NSR, old anterior MI  PMH: 1. Mild spina bifida 2. Type II diabetes 3. Hyperlipidemia 4. HTN 5. CKD 6. Anemia 7. PTSD 8. CAD: CABG 7/98 (Dr. Redmond Pulling).  Last Myoview in 2/07 showed EF 63%, no ischemia.  9. Gluteal decubitus ulcers 10. Obesity 11. Carotid stenosis: Carotid dopplers (5/13) with 40-59% bilateral ICA stenosis.  Carotid dopplers (1/15) with 40-59% bilateral ICA stenosis. 12. Diastolic CHF: Echo (0000000) with EF 50-55%, normal RV size with mildly decreased systolic function.   SH: Married, lives in [place], retired from [employer], prior smoker.   FH: CAD  ROS: All systems reviewed and negative except as per HPI.   Current Outpatient Prescriptions  Medication Sig Dispense Refill  . amoxicillin-clavulanate (AUGMENTIN) 875-125 MG per tablet Take 1 tablet by mouth 2 (two) times daily. 20 tablet 0  . aspirin 81 MG tablet Take 81 mg by mouth daily.      Marland Kitchen buPROPion (WELLBUTRIN XL) 300 MG 24 hr tablet TAKE 1 TABLET EVERY  MORNING 90 tablet 0  . Cholecalciferol (VITAMIN D-3) 5000 UNITS TABS Take 1 tablet by mouth daily.      . Coenzyme Q10 (CO Q-10) 100 MG CAPS Take 1 capsule by mouth 2 (two) times daily.     Marland Kitchen ezetimibe-simvastatin (VYTORIN) 10-20 MG per tablet TAKE 1 TABLET DAILYqpm    . furosemide (LASIX) 20 MG tablet Take 1 tablet (20 mg total) by mouth every morning. 90 tablet 3  . glipiZIDE (GLUCOTROL XL) 10 MG 24 hr tablet TAKE 1 TABLET DAILY 90 tablet 0  . metoprolol tartrate (LOPRESSOR) 25 MG tablet TAKE 1 TABLET TWICE A DAY 180 tablet 0  . Multiple Vitamins-Minerals (PRESERVISION AREDS 2 PO) Take 1 capsule by mouth 2 (two) times daily.    . nitroGLYCERIN (NITROSTAT) 0.4 MG SL tablet Place 1 tablet (0.4 mg total) under the tongue every 5 (five) minutes as needed. 25 tablet 6  . NON FORMULARY cran-actin 100mg  1 tablet daily    . NON FORMULARY Sweetish Bitters Daily    . Omega-3 Fatty Acids (FISH OIL) 1000 MG CAPS Take 1 capsule by mouth 2 (two) times daily.      . polyethylene glycol powder (GLYCOLAX/MIRALAX) powder Take 17 g by mouth 2 (two) times daily as needed. 3350 g 1  . sitaGLIPtin-metformin (JANUMET) 50-500 MG per tablet Take 1 tablet by mouth once daily with food 90 tablet 3  . sodium bicarbonate 650 MG  tablet Take 650 mg by mouth 3 (three) times daily.  4   No current facility-administered medications for this visit.    BP 126/62 mmHg  Pulse 71  Ht 5\' 8"  (1.727 m)  Wt 243 lb (110.224 kg)  BMI 36.96 kg/m2 General: NAD Neck: JVP 7 cm, no thyromegaly or thyroid nodule.  Lungs: CTAB CV: Nondisplaced PMI.  Heart regular S1/S2, no S3/S4, no murmur.  No peripheral edema.  No carotid bruit.  Normal pedal pulses.  Abdomen: Soft, nontender, no hepatosplenomegaly, no distention.  Neurologic: Alert and oriented x 3.  Psych: Normal affect. Extremities: No clubbing or cyanosis.   Assessment/Plan: 1. CAD: s/p CABG in 1998.  He has not had cardiac cath since that time.  Last Myoview was in 2007 and  was nonischemic. No chest pain.   - Continue ASA 81, statin, metoprolol.  2. Carotid stenosis: Stable, repeat dopplers 1/17.   3. Hyperlipidemia: Good LDL in 12/15 but LDL particle number still above goal.  Continue current statin, encouraged heart healthy diet. 4. HTN: BP stable.  5. Chronic diastolic CHF: EF [id number] on echo. NYHA class II symptoms now.  Continue Lasix 20 mg daily.  6. CKD: Avoid NSAIDs.  Sees Dr Lowanda Foster.   Followup in 9 months.   Loralie Champagne 05/11/2014

## 2014-05-11 NOTE — Patient Instructions (Signed)
Your physician wants you to follow-up in: December 2016. You will receive a reminder letter in the mail two months in advance. If you don't receive a letter, please call our office to schedule the follow-up appointment.

## 2014-05-12 NOTE — Addendum Note (Signed)
Addended by: Katrine Coho on: 05/12/2014 07:45 AM   Modules accepted: Orders

## 2014-05-15 ENCOUNTER — Other Ambulatory Visit (INDEPENDENT_AMBULATORY_CARE_PROVIDER_SITE_OTHER): Payer: Medicare Other

## 2014-05-15 DIAGNOSIS — R300 Dysuria: Secondary | ICD-10-CM

## 2014-05-15 DIAGNOSIS — M9904 Segmental and somatic dysfunction of sacral region: Secondary | ICD-10-CM | POA: Diagnosis not present

## 2014-05-15 DIAGNOSIS — M9902 Segmental and somatic dysfunction of thoracic region: Secondary | ICD-10-CM | POA: Diagnosis not present

## 2014-05-15 DIAGNOSIS — M9903 Segmental and somatic dysfunction of lumbar region: Secondary | ICD-10-CM | POA: Diagnosis not present

## 2014-05-15 DIAGNOSIS — M5430 Sciatica, unspecified side: Secondary | ICD-10-CM | POA: Diagnosis not present

## 2014-05-15 LAB — POCT URINALYSIS DIPSTICK
BILIRUBIN UA: NEGATIVE
Blood, UA: NEGATIVE
GLUCOSE UA: NEGATIVE
Ketones, UA: NEGATIVE
LEUKOCYTES UA: NEGATIVE
NITRITE UA: NEGATIVE
Protein, UA: NEGATIVE
Spec Grav, UA: 1.01
Urobilinogen, UA: NEGATIVE
pH, UA: 6

## 2014-05-15 NOTE — Progress Notes (Signed)
Lab only 

## 2014-05-15 NOTE — Addendum Note (Signed)
Addended by: Selmer Dominion on: 05/15/2014 03:54 PM   Modules accepted: Orders

## 2014-05-16 DIAGNOSIS — Z85828 Personal history of other malignant neoplasm of skin: Secondary | ICD-10-CM | POA: Diagnosis not present

## 2014-05-16 DIAGNOSIS — C44329 Squamous cell carcinoma of skin of other parts of face: Secondary | ICD-10-CM | POA: Diagnosis not present

## 2014-05-17 LAB — URINE CULTURE

## 2014-05-18 ENCOUNTER — Telehealth: Payer: Self-pay | Admitting: *Deleted

## 2014-05-18 ENCOUNTER — Other Ambulatory Visit: Payer: Self-pay | Admitting: Family Medicine

## 2014-05-18 NOTE — Telephone Encounter (Signed)
Patient aware of infection.  Script [facility] [address].  Stop doxycycline and begin this medication.  Repeat urine after completing antibiotic.

## 2014-05-18 NOTE — Telephone Encounter (Signed)
-----   Message from Chipper Herb, MD sent at 05/17/2014  9:25 PM EDT ----- Start ampicillin 500 mg 1,4 times a day for 10 days as the urine culture still has some infection in it. See if the patient is having any symptoms. The bacteria that is growing out is sensitive to the ampicillin. The patient should have one more urine checked when the antibiotic is completed.

## 2014-05-22 DIAGNOSIS — M9904 Segmental and somatic dysfunction of sacral region: Secondary | ICD-10-CM | POA: Diagnosis not present

## 2014-05-22 DIAGNOSIS — M9902 Segmental and somatic dysfunction of thoracic region: Secondary | ICD-10-CM | POA: Diagnosis not present

## 2014-05-22 DIAGNOSIS — M5430 Sciatica, unspecified side: Secondary | ICD-10-CM | POA: Diagnosis not present

## 2014-05-22 DIAGNOSIS — M9903 Segmental and somatic dysfunction of lumbar region: Secondary | ICD-10-CM | POA: Diagnosis not present

## 2014-05-29 DIAGNOSIS — M9902 Segmental and somatic dysfunction of thoracic region: Secondary | ICD-10-CM | POA: Diagnosis not present

## 2014-05-29 DIAGNOSIS — M9904 Segmental and somatic dysfunction of sacral region: Secondary | ICD-10-CM | POA: Diagnosis not present

## 2014-05-29 DIAGNOSIS — M9903 Segmental and somatic dysfunction of lumbar region: Secondary | ICD-10-CM | POA: Diagnosis not present

## 2014-05-29 DIAGNOSIS — M5430 Sciatica, unspecified side: Secondary | ICD-10-CM | POA: Diagnosis not present

## 2014-05-30 ENCOUNTER — Other Ambulatory Visit (INDEPENDENT_AMBULATORY_CARE_PROVIDER_SITE_OTHER): Payer: Medicare Other

## 2014-05-30 DIAGNOSIS — Z87448 Personal history of other diseases of urinary system: Secondary | ICD-10-CM | POA: Diagnosis not present

## 2014-05-30 DIAGNOSIS — L84 Corns and callosities: Secondary | ICD-10-CM | POA: Diagnosis not present

## 2014-05-30 DIAGNOSIS — B351 Tinea unguium: Secondary | ICD-10-CM | POA: Diagnosis not present

## 2014-05-30 DIAGNOSIS — E1151 Type 2 diabetes mellitus with diabetic peripheral angiopathy without gangrene: Secondary | ICD-10-CM | POA: Diagnosis not present

## 2014-05-30 DIAGNOSIS — Z87898 Personal history of other specified conditions: Secondary | ICD-10-CM

## 2014-05-30 LAB — POCT UA - MICROSCOPIC ONLY
Bacteria, U Microscopic: NEGATIVE
Casts, Ur, LPF, POC: NEGATIVE
Crystals, Ur, HPF, POC: NEGATIVE
Mucus, UA: NEGATIVE
RBC, URINE, MICROSCOPIC: NEGATIVE
YEAST UA: NEGATIVE

## 2014-05-30 LAB — POCT URINALYSIS DIPSTICK
Bilirubin, UA: NEGATIVE
Blood, UA: NEGATIVE
Glucose, UA: NEGATIVE
Ketones, UA: NEGATIVE
LEUKOCYTES UA: NEGATIVE
Nitrite, UA: NEGATIVE
PH UA: 6.5
PROTEIN UA: NEGATIVE
Spec Grav, UA: 1.01
Urobilinogen, UA: NEGATIVE

## 2014-05-30 NOTE — Progress Notes (Signed)
Lab only 

## 2014-06-05 DIAGNOSIS — M9903 Segmental and somatic dysfunction of lumbar region: Secondary | ICD-10-CM | POA: Diagnosis not present

## 2014-06-05 DIAGNOSIS — M5430 Sciatica, unspecified side: Secondary | ICD-10-CM | POA: Diagnosis not present

## 2014-06-05 DIAGNOSIS — M9902 Segmental and somatic dysfunction of thoracic region: Secondary | ICD-10-CM | POA: Diagnosis not present

## 2014-06-05 DIAGNOSIS — M9904 Segmental and somatic dysfunction of sacral region: Secondary | ICD-10-CM | POA: Diagnosis not present

## 2014-06-08 DIAGNOSIS — H903 Sensorineural hearing loss, bilateral: Secondary | ICD-10-CM | POA: Diagnosis not present

## 2014-06-12 DIAGNOSIS — M5430 Sciatica, unspecified side: Secondary | ICD-10-CM | POA: Diagnosis not present

## 2014-06-12 DIAGNOSIS — M9903 Segmental and somatic dysfunction of lumbar region: Secondary | ICD-10-CM | POA: Diagnosis not present

## 2014-06-12 DIAGNOSIS — M9904 Segmental and somatic dysfunction of sacral region: Secondary | ICD-10-CM | POA: Diagnosis not present

## 2014-06-12 DIAGNOSIS — M9902 Segmental and somatic dysfunction of thoracic region: Secondary | ICD-10-CM | POA: Diagnosis not present

## 2014-06-19 ENCOUNTER — Encounter: Payer: Self-pay | Admitting: Family Medicine

## 2014-06-19 ENCOUNTER — Ambulatory Visit (INDEPENDENT_AMBULATORY_CARE_PROVIDER_SITE_OTHER): Payer: Medicare Other | Admitting: Family Medicine

## 2014-06-19 VITALS — BP 119/61 | HR 65 | Temp 96.8°F | Ht 68.0 in | Wt 225.0 lb

## 2014-06-19 DIAGNOSIS — K060 Gingival recession: Secondary | ICD-10-CM

## 2014-06-19 DIAGNOSIS — Q059 Spina bifida, unspecified: Secondary | ICD-10-CM | POA: Diagnosis not present

## 2014-06-19 DIAGNOSIS — E559 Vitamin D deficiency, unspecified: Secondary | ICD-10-CM | POA: Diagnosis not present

## 2014-06-19 DIAGNOSIS — F4310 Post-traumatic stress disorder, unspecified: Secondary | ICD-10-CM | POA: Diagnosis not present

## 2014-06-19 DIAGNOSIS — E1121 Type 2 diabetes mellitus with diabetic nephropathy: Secondary | ICD-10-CM | POA: Diagnosis not present

## 2014-06-19 DIAGNOSIS — I2510 Atherosclerotic heart disease of native coronary artery without angina pectoris: Secondary | ICD-10-CM

## 2014-06-19 DIAGNOSIS — IMO0002 Reserved for concepts with insufficient information to code with codable children: Secondary | ICD-10-CM

## 2014-06-19 DIAGNOSIS — I10 Essential (primary) hypertension: Secondary | ICD-10-CM

## 2014-06-19 DIAGNOSIS — E785 Hyperlipidemia, unspecified: Secondary | ICD-10-CM | POA: Diagnosis not present

## 2014-06-19 LAB — POCT CBC
GRANULOCYTE PERCENT: 72.8 % (ref 37–80)
HEMATOCRIT: 44.4 % (ref 43.5–53.7)
Hemoglobin: 13.7 g/dL — AB (ref 14.1–18.1)
Lymph, poc: 2.6 (ref 0.6–3.4)
MCH: 28.2 pg (ref 27–31.2)
MCHC: 30.9 g/dL — AB (ref 31.8–35.4)
MCV: 91.1 fL (ref 80–97)
MPV: 7.6 fL (ref 0–99.8)
POC Granulocyte: 8.9 — AB (ref 2–6.9)
POC LYMPH %: 21.5 % (ref 10–50)
Platelet Count, POC: 235 10*3/uL (ref 142–424)
RBC: 4.87 M/uL (ref 4.69–6.13)
RDW, POC: 15.2 %
WBC: 12.2 10*3/uL — AB (ref 4.6–10.2)

## 2014-06-19 LAB — POCT GLYCOSYLATED HEMOGLOBIN (HGB A1C): HEMOGLOBIN A1C: 6.7

## 2014-06-19 NOTE — Progress Notes (Signed)
Subjective:    Patient ID: [name]., male    DOB: March 18, 1930, 81 y.o.   MRN: [id number]  HPI Pt here for follow up and management of chronic medical problems which includes diabetes, hypertension and CKD. He is taking medications regularly. The patient had a recent visit with the cardiologist and he felt that everything was stable cardiac-wise and we'll see him again in 9 months. The patient does complain of some nausea and vomiting this morning. The patient thinks that this is coming from having taken his medicine on an empty stomach. He is complaining of some problems with his gums in his mouth and increased sensitivity. He is requesting referral to an oral surgeon. Also, it is important to note the patient had a recent basal cell skin cancer removed from his left cheek by a dermatologist and this is healing well and he is very pleased with the results of the surgery and the physician he saw. The patient will get lab work today and will be given an FOBT to return. His home blood sugars fasting have been running anywhere from the 80s up to the 179. The nighttime blood sugars at 9 PM and been running around 185-210.       Patient Active Problem List   Diagnosis Date Noted  . Chronic diastolic CHF (congestive heart failure) 09/22/2013  . Constipation 07/05/2013  . Cellulitis 01/19/2013  . Abscess or cellulitis of gluteal region 01/19/2013  . UTI (lower urinary tract infection) 01/19/2013  . Urinary incontinence 12/13/2012  . External hemorrhoids with complication 44/31/5400  . Chronic constipation 11/29/2012  . Metabolic syndrome 86/76/1950  . Hemorrhoid prolapse 11/29/2012  . Spina bifida 11/18/2012  . CAD, NATIVE VESSEL 03/08/2009  . Type 2 diabetes mellitus with diabetic nephropathy 10/29/2008  . MALNUTRITION, PROTEIN-CALORIE 10/29/2008  . HYPERLIPIDEMIA 10/29/2008  . HYPOKALEMIA 10/29/2008  . ANEMIA 10/29/2008  . PTSD 10/29/2008  . HYPERTENSION, UNSPECIFIED 10/29/2008    . Chronic kidney disease 10/29/2008   Outpatient Encounter Prescriptions as of 06/19/2014  Medication Sig  . aspirin 81 MG tablet Take 81 mg by mouth daily.    Marland Kitchen buPROPion (WELLBUTRIN XL) 300 MG 24 hr tablet TAKE 1 TABLET EVERY MORNING  . Cholecalciferol (VITAMIN D-3) 5000 UNITS TABS Take 1 tablet by mouth daily.    . Coenzyme Q10 (CO Q-10) 100 MG CAPS Take 1 capsule by mouth 2 (two) times daily.   Marland Kitchen ezetimibe-simvastatin (VYTORIN) 10-20 MG per tablet TAKE 1 TABLET DAILYqpm  . furosemide (LASIX) 20 MG tablet Take 1 tablet (20 mg total) by mouth every morning.  Marland Kitchen glipiZIDE (GLUCOTROL XL) 10 MG 24 hr tablet TAKE 1 TABLET DAILY  . metoprolol tartrate (LOPRESSOR) 25 MG tablet TAKE 1 TABLET TWICE A DAY  . Multiple Vitamins-Minerals (PRESERVISION AREDS 2 PO) Take 1 capsule by mouth 2 (two) times daily.  . nitroGLYCERIN (NITROSTAT) 0.4 MG SL tablet Place 1 tablet (0.4 mg total) under the tongue every 5 (five) minutes as needed.  . NON FORMULARY cran-actin 155m 1 tablet daily  . NON FORMULARY Sweetish Bitters Daily  . Omega-3 Fatty Acids (FISH OIL) 1000 MG CAPS Take 1 capsule by mouth 2 (two) times daily.    . polyethylene glycol powder (GLYCOLAX/MIRALAX) powder Take 17 g by mouth 2 (two) times daily as needed.  . sitaGLIPtin-metformin (JANUMET) 50-500 MG per tablet Take 1 tablet by mouth once daily with food  . sodium bicarbonate 650 MG tablet Take 650 mg by mouth 3 (three) times daily.  . [  DISCONTINUED] amoxicillin-clavulanate (AUGMENTIN) 875-125 MG per tablet Take 1 tablet by mouth 2 (two) times daily.     Review of Systems  Constitutional: Negative.   HENT: Negative.   Eyes: Negative.   Respiratory: Negative.   Cardiovascular: Negative.   Gastrointestinal: Positive for nausea and vomiting (this morning ).  Endocrine: Negative.   Genitourinary: Negative.   Musculoskeletal: Negative.   Skin: Negative.   Allergic/Immunologic: Negative.   Neurological: Negative.   Hematological:  Negative.   Psychiatric/Behavioral: Negative.        Objective:   Physical Exam  Constitutional: He is oriented to person, place, and time. He appears well-developed and well-nourished. No distress.  HENT:  Head: Normocephalic and atraumatic.  Right Ear: External ear normal.  Left Ear: External ear normal.  Nose: Nose normal.  Mouth/Throat: Oropharynx is clear and moist. No oropharyngeal exudate.  There is gum recession and sensitivity  Eyes: Conjunctivae and EOM are normal. Pupils are equal, round, and reactive to light. Right eye exhibits no discharge. Left eye exhibits no discharge. No scleral icterus.  Neck: Normal range of motion. Neck supple. No thyromegaly present.  Without bruits or anterior cervical adenopathy or thyromegaly  Cardiovascular: Normal rate, regular rhythm, normal heart sounds and intact distal pulses.  Exam reveals no gallop and no friction rub.   No murmur heard. At 60/m with a slightly irregular rhythm.  Pulmonary/Chest: Effort normal and breath sounds normal. No respiratory distress. He has no wheezes. He has no rales. He exhibits no tenderness.  Lungs are clear anteriorly and posteriorly  Abdominal: Soft. Bowel sounds are normal. He exhibits no mass. There is no tenderness. There is no rebound and no guarding.  Normal bowel sounds without masses or tenderness  Musculoskeletal: Normal range of motion. He exhibits no edema or tenderness.  The patient comes in and his rolling wheelchair.  Lymphadenopathy:    He has no cervical adenopathy.  Neurological: He is alert and oriented to person, place, and time. He has normal reflexes. No cranial nerve deficit.  Skin: Skin is warm and dry. No rash noted. No erythema. No pallor.  Psychiatric: He has a normal mood and affect. His behavior is normal. Judgment and thought content normal.  Nursing note and vitals reviewed.   BP 119/61 mmHg  Pulse 65  Temp(Src) 96.8 F (36 C) (Oral)  Ht _0  (1.727 m)  Wt 225 lb  (102.059 kg)  BMI 34.22 kg/m2       Assessment & Plan:  1. Type 2 diabetes mellitus with diabetic nephropathy -The blood sugars appear to be under fairly good control with readings brought in by the patient. His last A1c was around 7.0%. - POCT CBC - POCT glycosylated hemoglobin (Hb A1C) - BMP8+EGFR  2. Vitamin D deficiency -He should continue his current dose of vitamin D pending results of lab work today. - POCT CBC - Vit D  25 hydroxy (rtn osteoporosis monitoring)  3. Essential hypertension -Blood pressure was under good control today and patient should continue with his current treatment - POCT CBC - BMP8+EGFR - Hepatic function panel  4. Hyperlipemia -Cholesterol numbers were elevated on his last blood check and we will defer any treatment change until the results of today's lab work is returned. - POCT CBC - NMR, lipoprofile  5. Atherosclerosis of native coronary artery of native heart without angina pectoris -The patient should continue follow-up with cardiology - POCT CBC - BMP8+EGFR - Hepatic function panel - NMR, lipoprofile  6. Spina bifida -The  patient is stable with his spina bifida  7. PTSD -The patient has no complaints regarding his PTSD and seems to be in good spirits today.  No orders of the defined types were placed in this encounter.   Patient Instructions                       Medicare Annual Wellness Visit  [facility] and the medical providers at [facility] strive to bring you the best medical care.  In doing so we not only want to address your current medical conditions and concerns but also to detect new conditions early and prevent illness, disease and health-related problems.    Medicare offers a yearly Wellness Visit which allows our clinical staff to assess your need for preventative services including immunizations, lifestyle education, counseling to decrease risk of preventable diseases and screening for fall risk  and other medical concerns.    This visit is provided free of charge (no copay) for all Medicare recipients. The clinical pharmacists at [facility] have begun to conduct these Wellness Visits which will also include a thorough review of all your medications.    As you primary medical provider recommend that you make an appointment for your Annual Wellness Visit if you have not done so already this year.  You may set up this appointment before you leave today or you may call back [phone]) and schedule an appointment.  Please make sure when you call that you mention that you are scheduling your Annual Wellness Visit with the clinical pharmacist so that the appointment may be made for the proper length of time.     Continue current medications. Continue good therapeutic lifestyle changes which include good diet and exercise. Fall precautions discussed with patient. If an FOBT was given today- please return it to our front desk. If you are over 67 years old - you may need Prevnar 83 or the adult Pneumonia vaccine.  Flu Shots are still available at our office. If you still haven't had one please call to set up a nurse visit to get one.   After your visit with [place] today you will receive a survey in the mail or online from [employer] regarding your care with [place]. Please take a moment to fill this out. Your feedback is very important to [place] as you can help [place] better understand your patient needs as well as improve your experience and satisfaction. WE CARE ABOUT YOU!!!   We will arrange for you to see an oral surgeon because of the sensitive issues in your mouth because of gum recession. Continue to follow-up with the wound treatment center as needed for the pressure sores on her buttocks Continue to monitor blood sugars regularly and have your wife check your feet regularly The patient was encouraged to drink more fluids the next 2-3 days and eat a more bland diet. In the future  he should take his medication with some food.   Arrie Senate MD

## 2014-06-19 NOTE — Patient Instructions (Addendum)
Medicare Annual Wellness Visit  [facility] and the medical providers at [facility] strive to bring you the best medical care.  In doing so we not only want to address your current medical conditions and concerns but also to detect new conditions early and prevent illness, disease and health-related problems.    Medicare offers a yearly Wellness Visit which allows our clinical staff to assess your need for preventative services including immunizations, lifestyle education, counseling to decrease risk of preventable diseases and screening for fall risk and other medical concerns.    This visit is provided free of charge (no copay) for all Medicare recipients. The clinical pharmacists at [facility] have begun to conduct these Wellness Visits which will also include a thorough review of all your medications.    As you primary medical provider recommend that you make an appointment for your Annual Wellness Visit if you have not done so already this year.  You may set up this appointment before you leave today or you may call back [phone] and schedule an appointment.  Please make sure when you call that you mention that you are scheduling your Annual Wellness Visit with the clinical pharmacist so that the appointment may be made for the proper length of time.     Continue current medications. Continue good therapeutic lifestyle changes which include good diet and exercise. Fall precautions discussed with patient. If an FOBT was given today- please return it to our front desk. If you are over 19 years old - you may need Prevnar 76 or the adult Pneumonia vaccine.  Flu Shots are still available at our office. If you still haven't had one please call to set up a nurse visit to get one.   After your visit with [place] today you will receive a survey in the mail or online from [employer] regarding your care with [place]. Please take a moment to  fill this out. Your feedback is very important to [place] as you can help [place] better understand your patient needs as well as improve your experience and satisfaction. WE CARE ABOUT YOU!!!   We will arrange for you to see an oral surgeon because of the sensitive issues in your mouth because of gum recession. Continue to follow-up with the wound treatment center as needed for the pressure sores on her buttocks Continue to monitor blood sugars regularly and have your wife check your feet regularly The patient was encouraged to drink more fluids the next 2-3 days and eat a more bland diet. In the future he should take his medication with some food.

## 2014-06-20 DIAGNOSIS — M5430 Sciatica, unspecified side: Secondary | ICD-10-CM | POA: Diagnosis not present

## 2014-06-20 DIAGNOSIS — M9903 Segmental and somatic dysfunction of lumbar region: Secondary | ICD-10-CM | POA: Diagnosis not present

## 2014-06-20 DIAGNOSIS — M9902 Segmental and somatic dysfunction of thoracic region: Secondary | ICD-10-CM | POA: Diagnosis not present

## 2014-06-20 DIAGNOSIS — M9904 Segmental and somatic dysfunction of sacral region: Secondary | ICD-10-CM | POA: Diagnosis not present

## 2014-06-20 LAB — HEPATIC FUNCTION PANEL
ALBUMIN: 4.3 g/dL (ref 3.5–4.7)
ALT: 10 IU/L (ref 0–44)
AST: 12 IU/L (ref 0–40)
Alkaline Phosphatase: 68 IU/L (ref 39–117)
Bilirubin Total: 0.5 mg/dL (ref 0.0–1.2)
Bilirubin, Direct: 0.15 mg/dL (ref 0.00–0.40)
Total Protein: 6.5 g/dL (ref 6.0–8.5)

## 2014-06-20 LAB — NMR, LIPOPROFILE
CHOLESTEROL: 148 mg/dL (ref 100–199)
HDL Cholesterol by NMR: 49 mg/dL (ref >39)
HDL PARTICLE NUMBER: 36 umol/L (ref >=30.5)
LDL Particle Number: 1193 nmol/L — ABNORMAL HIGH (ref <1000)
LDL Size: 20.3 nm (ref >20.5)
LDL-C: 78 mg/dL (ref 0–99)
LP-IR Score: 40 (ref <=45)
Small LDL Particle Number: 670 nmol/L — ABNORMAL HIGH (ref <=527)
TRIGLYCERIDES BY NMR: 107 mg/dL (ref 0–149)

## 2014-06-20 LAB — BMP8+EGFR
BUN/Creatinine Ratio: 16 (ref 10–22)
BUN: 28 mg/dL — AB (ref 8–27)
CHLORIDE: 101 mmol/L (ref 97–108)
CO2: 23 mmol/L (ref 18–29)
Calcium: 9.2 mg/dL (ref 8.6–10.2)
Creatinine, Ser: 1.74 mg/dL — ABNORMAL HIGH (ref 0.76–1.27)
GFR calc Af Amer: 42 mL/min/{1.73_m2} — ABNORMAL LOW (ref >59)
GFR, EST NON AFRICAN AMERICAN: 36 mL/min/{1.73_m2} — AB (ref >59)
GLUCOSE: 187 mg/dL — AB (ref 65–99)
Potassium: 4.9 mmol/L (ref 3.5–5.2)
Sodium: 143 mmol/L (ref 134–144)

## 2014-06-20 LAB — VITAMIN D 25 HYDROXY (VIT D DEFICIENCY, FRACTURES): Vit D, 25-Hydroxy: 49.3 ng/mL (ref 30.0–100.0)

## 2014-06-21 ENCOUNTER — Telehealth: Payer: Self-pay | Admitting: *Deleted

## 2014-06-21 NOTE — Telephone Encounter (Signed)
-----   Message from Chipper Herb, MD sent at 06/20/2014  7:25 AM EDT ----- The blood sugar was elevated at 187. The creatinine, the most important kidney function test remains elevated but is slightly decreased from 4 months ago. The electrolytes including potassium are within normal limits. All liver function tests are within normal limits. Cholesterol numbers with advanced lipid testing have improved from 4 months ago but are still elevated above the goal of 1000. The current level is 1193 and this should be less than 1000. The LDL C is slightly elevated at 78. The good cholesterol or the HDL particle number is good. The triglycerides are also good.----The patient should continue with his Vytorin and with as aggressive therapeutic lifestyle changes as possible which include diet and exercise The vitamin D level is good at 49.3 and the patient should continue with his current treatment

## 2014-06-21 NOTE — Telephone Encounter (Signed)
Pt notified of results Verbalizes understanding 

## 2014-06-26 DIAGNOSIS — M9904 Segmental and somatic dysfunction of sacral region: Secondary | ICD-10-CM | POA: Diagnosis not present

## 2014-06-26 DIAGNOSIS — M5430 Sciatica, unspecified side: Secondary | ICD-10-CM | POA: Diagnosis not present

## 2014-06-26 DIAGNOSIS — M9903 Segmental and somatic dysfunction of lumbar region: Secondary | ICD-10-CM | POA: Diagnosis not present

## 2014-06-26 DIAGNOSIS — M9902 Segmental and somatic dysfunction of thoracic region: Secondary | ICD-10-CM | POA: Diagnosis not present

## 2014-07-03 DIAGNOSIS — M9902 Segmental and somatic dysfunction of thoracic region: Secondary | ICD-10-CM | POA: Diagnosis not present

## 2014-07-03 DIAGNOSIS — M9903 Segmental and somatic dysfunction of lumbar region: Secondary | ICD-10-CM | POA: Diagnosis not present

## 2014-07-03 DIAGNOSIS — M5430 Sciatica, unspecified side: Secondary | ICD-10-CM | POA: Diagnosis not present

## 2014-07-03 DIAGNOSIS — M9904 Segmental and somatic dysfunction of sacral region: Secondary | ICD-10-CM | POA: Diagnosis not present

## 2014-07-11 ENCOUNTER — Encounter: Payer: Self-pay | Admitting: Family Medicine

## 2014-07-12 DIAGNOSIS — M9902 Segmental and somatic dysfunction of thoracic region: Secondary | ICD-10-CM | POA: Diagnosis not present

## 2014-07-12 DIAGNOSIS — M9903 Segmental and somatic dysfunction of lumbar region: Secondary | ICD-10-CM | POA: Diagnosis not present

## 2014-07-12 DIAGNOSIS — M9904 Segmental and somatic dysfunction of sacral region: Secondary | ICD-10-CM | POA: Diagnosis not present

## 2014-07-12 DIAGNOSIS — M5430 Sciatica, unspecified side: Secondary | ICD-10-CM | POA: Diagnosis not present

## 2014-07-17 DIAGNOSIS — M9902 Segmental and somatic dysfunction of thoracic region: Secondary | ICD-10-CM | POA: Diagnosis not present

## 2014-07-17 DIAGNOSIS — M5137 Other intervertebral disc degeneration, lumbosacral region: Secondary | ICD-10-CM | POA: Diagnosis not present

## 2014-07-17 DIAGNOSIS — M9903 Segmental and somatic dysfunction of lumbar region: Secondary | ICD-10-CM | POA: Diagnosis not present

## 2014-07-17 DIAGNOSIS — M9901 Segmental and somatic dysfunction of cervical region: Secondary | ICD-10-CM | POA: Diagnosis not present

## 2014-07-25 ENCOUNTER — Other Ambulatory Visit: Payer: Self-pay | Admitting: Family Medicine

## 2014-07-27 DIAGNOSIS — M9903 Segmental and somatic dysfunction of lumbar region: Secondary | ICD-10-CM | POA: Diagnosis not present

## 2014-07-27 DIAGNOSIS — M5137 Other intervertebral disc degeneration, lumbosacral region: Secondary | ICD-10-CM | POA: Diagnosis not present

## 2014-07-27 DIAGNOSIS — M9902 Segmental and somatic dysfunction of thoracic region: Secondary | ICD-10-CM | POA: Diagnosis not present

## 2014-07-27 DIAGNOSIS — M9901 Segmental and somatic dysfunction of cervical region: Secondary | ICD-10-CM | POA: Diagnosis not present

## 2014-07-29 ENCOUNTER — Other Ambulatory Visit: Payer: Self-pay | Admitting: Cardiology

## 2014-07-31 ENCOUNTER — Other Ambulatory Visit: Payer: Self-pay | Admitting: Family Medicine

## 2014-07-31 DIAGNOSIS — M9901 Segmental and somatic dysfunction of cervical region: Secondary | ICD-10-CM | POA: Diagnosis not present

## 2014-07-31 DIAGNOSIS — M9903 Segmental and somatic dysfunction of lumbar region: Secondary | ICD-10-CM | POA: Diagnosis not present

## 2014-07-31 DIAGNOSIS — M5137 Other intervertebral disc degeneration, lumbosacral region: Secondary | ICD-10-CM | POA: Diagnosis not present

## 2014-07-31 DIAGNOSIS — M9902 Segmental and somatic dysfunction of thoracic region: Secondary | ICD-10-CM | POA: Diagnosis not present

## 2014-08-09 DIAGNOSIS — M9901 Segmental and somatic dysfunction of cervical region: Secondary | ICD-10-CM | POA: Diagnosis not present

## 2014-08-09 DIAGNOSIS — M9902 Segmental and somatic dysfunction of thoracic region: Secondary | ICD-10-CM | POA: Diagnosis not present

## 2014-08-09 DIAGNOSIS — M5137 Other intervertebral disc degeneration, lumbosacral region: Secondary | ICD-10-CM | POA: Diagnosis not present

## 2014-08-09 DIAGNOSIS — M9903 Segmental and somatic dysfunction of lumbar region: Secondary | ICD-10-CM | POA: Diagnosis not present

## 2014-08-14 ENCOUNTER — Other Ambulatory Visit: Payer: Self-pay | Admitting: Family Medicine

## 2014-08-14 DIAGNOSIS — M9901 Segmental and somatic dysfunction of cervical region: Secondary | ICD-10-CM | POA: Diagnosis not present

## 2014-08-14 DIAGNOSIS — M9903 Segmental and somatic dysfunction of lumbar region: Secondary | ICD-10-CM | POA: Diagnosis not present

## 2014-08-14 DIAGNOSIS — M5137 Other intervertebral disc degeneration, lumbosacral region: Secondary | ICD-10-CM | POA: Diagnosis not present

## 2014-08-14 DIAGNOSIS — M9902 Segmental and somatic dysfunction of thoracic region: Secondary | ICD-10-CM | POA: Diagnosis not present

## 2014-08-16 DIAGNOSIS — I10 Essential (primary) hypertension: Secondary | ICD-10-CM | POA: Diagnosis not present

## 2014-08-16 DIAGNOSIS — N183 Chronic kidney disease, stage 3 (moderate): Secondary | ICD-10-CM | POA: Diagnosis not present

## 2014-08-16 DIAGNOSIS — E559 Vitamin D deficiency, unspecified: Secondary | ICD-10-CM | POA: Diagnosis not present

## 2014-08-16 DIAGNOSIS — R809 Proteinuria, unspecified: Secondary | ICD-10-CM | POA: Diagnosis not present

## 2014-08-16 DIAGNOSIS — D649 Anemia, unspecified: Secondary | ICD-10-CM | POA: Diagnosis not present

## 2014-08-16 DIAGNOSIS — Z79899 Other long term (current) drug therapy: Secondary | ICD-10-CM | POA: Diagnosis not present

## 2014-08-18 DIAGNOSIS — N183 Chronic kidney disease, stage 3 (moderate): Secondary | ICD-10-CM | POA: Diagnosis not present

## 2014-08-18 DIAGNOSIS — D649 Anemia, unspecified: Secondary | ICD-10-CM | POA: Diagnosis not present

## 2014-08-18 DIAGNOSIS — I10 Essential (primary) hypertension: Secondary | ICD-10-CM | POA: Diagnosis not present

## 2014-08-18 DIAGNOSIS — Z79899 Other long term (current) drug therapy: Secondary | ICD-10-CM | POA: Diagnosis not present

## 2014-08-18 DIAGNOSIS — R809 Proteinuria, unspecified: Secondary | ICD-10-CM | POA: Diagnosis not present

## 2014-08-18 DIAGNOSIS — E559 Vitamin D deficiency, unspecified: Secondary | ICD-10-CM | POA: Diagnosis not present

## 2014-08-22 DIAGNOSIS — E875 Hyperkalemia: Secondary | ICD-10-CM | POA: Diagnosis not present

## 2014-08-22 DIAGNOSIS — I10 Essential (primary) hypertension: Secondary | ICD-10-CM | POA: Diagnosis not present

## 2014-08-22 DIAGNOSIS — N2581 Secondary hyperparathyroidism of renal origin: Secondary | ICD-10-CM | POA: Diagnosis not present

## 2014-08-22 DIAGNOSIS — E669 Obesity, unspecified: Secondary | ICD-10-CM | POA: Diagnosis not present

## 2014-08-22 DIAGNOSIS — N183 Chronic kidney disease, stage 3 (moderate): Secondary | ICD-10-CM | POA: Diagnosis not present

## 2014-08-24 DIAGNOSIS — B351 Tinea unguium: Secondary | ICD-10-CM | POA: Diagnosis not present

## 2014-08-24 DIAGNOSIS — E1151 Type 2 diabetes mellitus with diabetic peripheral angiopathy without gangrene: Secondary | ICD-10-CM | POA: Diagnosis not present

## 2014-08-24 DIAGNOSIS — L84 Corns and callosities: Secondary | ICD-10-CM | POA: Diagnosis not present

## 2014-08-24 DIAGNOSIS — M5137 Other intervertebral disc degeneration, lumbosacral region: Secondary | ICD-10-CM | POA: Diagnosis not present

## 2014-08-24 DIAGNOSIS — M9902 Segmental and somatic dysfunction of thoracic region: Secondary | ICD-10-CM | POA: Diagnosis not present

## 2014-08-24 DIAGNOSIS — M9901 Segmental and somatic dysfunction of cervical region: Secondary | ICD-10-CM | POA: Diagnosis not present

## 2014-08-24 DIAGNOSIS — M9903 Segmental and somatic dysfunction of lumbar region: Secondary | ICD-10-CM | POA: Diagnosis not present

## 2014-08-30 DIAGNOSIS — H3531 Nonexudative age-related macular degeneration: Secondary | ICD-10-CM | POA: Diagnosis not present

## 2014-08-30 DIAGNOSIS — H26493 Other secondary cataract, bilateral: Secondary | ICD-10-CM | POA: Diagnosis not present

## 2014-08-30 DIAGNOSIS — H43813 Vitreous degeneration, bilateral: Secondary | ICD-10-CM | POA: Diagnosis not present

## 2014-08-30 DIAGNOSIS — E119 Type 2 diabetes mellitus without complications: Secondary | ICD-10-CM | POA: Diagnosis not present

## 2014-08-30 LAB — HM DIABETES EYE EXAM

## 2014-08-31 DIAGNOSIS — E875 Hyperkalemia: Secondary | ICD-10-CM | POA: Diagnosis not present

## 2014-09-05 ENCOUNTER — Telehealth: Payer: Self-pay | Admitting: Family Medicine

## 2014-09-05 MED ORDER — AMOXICILLIN-POT CLAVULANATE 875-125 MG PO TABS: 1.0000 | ORAL_TABLET | Freq: Two times a day (BID) | ORAL | Status: DC

## 2014-09-05 NOTE — Telephone Encounter (Signed)
Please call a prescription in for Augmentin 875 one twice daily with food for 2 weeks

## 2014-09-05 NOTE — Telephone Encounter (Signed)
Pt states that he has a strong odor, dysuria and yellowish-orange color. No OTC medications. Last UTI was covered by Augmentin 875. [facility] [place]

## 2014-09-06 ENCOUNTER — Other Ambulatory Visit: Payer: Self-pay | Admitting: Family Medicine

## 2014-09-06 MED ORDER — AMOXICILLIN-POT CLAVULANATE 875-125 MG PO TABS: 1.0000 | ORAL_TABLET | Freq: Two times a day (BID) | ORAL | Status: DC

## 2014-09-06 NOTE — Telephone Encounter (Signed)
Antibiotic sent in. Please see note from 09/05/2014

## 2014-09-06 NOTE — Telephone Encounter (Signed)
Patient is calling and states that he has a UTI again and would like a antibiotic sent

## 2014-09-08 ENCOUNTER — Encounter: Payer: Self-pay | Admitting: Family Medicine

## 2014-09-13 DIAGNOSIS — M9901 Segmental and somatic dysfunction of cervical region: Secondary | ICD-10-CM | POA: Diagnosis not present

## 2014-09-13 DIAGNOSIS — M9903 Segmental and somatic dysfunction of lumbar region: Secondary | ICD-10-CM | POA: Diagnosis not present

## 2014-09-13 DIAGNOSIS — M5137 Other intervertebral disc degeneration, lumbosacral region: Secondary | ICD-10-CM | POA: Diagnosis not present

## 2014-09-13 DIAGNOSIS — M9902 Segmental and somatic dysfunction of thoracic region: Secondary | ICD-10-CM | POA: Diagnosis not present

## 2014-10-11 ENCOUNTER — Other Ambulatory Visit: Payer: Self-pay | Admitting: Cardiology

## 2014-10-24 DIAGNOSIS — Z85828 Personal history of other malignant neoplasm of skin: Secondary | ICD-10-CM | POA: Diagnosis not present

## 2014-10-24 DIAGNOSIS — L821 Other seborrheic keratosis: Secondary | ICD-10-CM | POA: Diagnosis not present

## 2014-10-24 DIAGNOSIS — L570 Actinic keratosis: Secondary | ICD-10-CM | POA: Diagnosis not present

## 2014-10-24 DIAGNOSIS — D225 Melanocytic nevi of trunk: Secondary | ICD-10-CM | POA: Diagnosis not present

## 2014-10-27 ENCOUNTER — Other Ambulatory Visit: Payer: Self-pay | Admitting: Family Medicine

## 2014-10-31 ENCOUNTER — Ambulatory Visit (INDEPENDENT_AMBULATORY_CARE_PROVIDER_SITE_OTHER): Payer: Medicare Other | Admitting: Family Medicine

## 2014-10-31 ENCOUNTER — Ambulatory Visit (INDEPENDENT_AMBULATORY_CARE_PROVIDER_SITE_OTHER): Payer: Medicare Other

## 2014-10-31 ENCOUNTER — Encounter: Payer: Self-pay | Admitting: Family Medicine

## 2014-10-31 VITALS — BP 144/71 | HR 78 | Temp 97.2°F | Wt 217.0 lb

## 2014-10-31 DIAGNOSIS — I10 Essential (primary) hypertension: Secondary | ICD-10-CM

## 2014-10-31 DIAGNOSIS — E785 Hyperlipidemia, unspecified: Secondary | ICD-10-CM

## 2014-10-31 DIAGNOSIS — S31000D Unspecified open wound of lower back and pelvis without penetration into retroperitoneum, subsequent encounter: Secondary | ICD-10-CM

## 2014-10-31 DIAGNOSIS — E559 Vitamin D deficiency, unspecified: Secondary | ICD-10-CM

## 2014-10-31 DIAGNOSIS — I5032 Chronic diastolic (congestive) heart failure: Secondary | ICD-10-CM

## 2014-10-31 DIAGNOSIS — Z125 Encounter for screening for malignant neoplasm of prostate: Secondary | ICD-10-CM | POA: Diagnosis not present

## 2014-10-31 DIAGNOSIS — F4310 Post-traumatic stress disorder, unspecified: Secondary | ICD-10-CM

## 2014-10-31 DIAGNOSIS — K5909 Other constipation: Secondary | ICD-10-CM

## 2014-10-31 DIAGNOSIS — R300 Dysuria: Secondary | ICD-10-CM

## 2014-10-31 DIAGNOSIS — I2510 Atherosclerotic heart disease of native coronary artery without angina pectoris: Secondary | ICD-10-CM | POA: Diagnosis not present

## 2014-10-31 DIAGNOSIS — K5900 Constipation, unspecified: Secondary | ICD-10-CM

## 2014-10-31 DIAGNOSIS — L89312 Pressure ulcer of right buttock, stage 2: Secondary | ICD-10-CM | POA: Diagnosis not present

## 2014-10-31 DIAGNOSIS — L89309 Pressure ulcer of unspecified buttock, unspecified stage: Secondary | ICD-10-CM | POA: Insufficient documentation

## 2014-10-31 DIAGNOSIS — E1121 Type 2 diabetes mellitus with diabetic nephropathy: Secondary | ICD-10-CM | POA: Diagnosis not present

## 2014-10-31 DIAGNOSIS — Q059 Spina bifida, unspecified: Secondary | ICD-10-CM | POA: Diagnosis not present

## 2014-10-31 LAB — POCT UA - MICROSCOPIC ONLY
CRYSTALS, UR, HPF, POC: NEGATIVE
Casts, Ur, LPF, POC: NEGATIVE
MUCUS UA: NEGATIVE
Yeast, UA: NEGATIVE

## 2014-10-31 LAB — POCT URINALYSIS DIPSTICK
Bilirubin, UA: NEGATIVE
Glucose, UA: NEGATIVE
Ketones, UA: NEGATIVE
NITRITE UA: NEGATIVE
PROTEIN UA: NEGATIVE
SPEC GRAV UA: 1.015
UROBILINOGEN UA: NEGATIVE
pH, UA: 6

## 2014-10-31 LAB — POCT GLYCOSYLATED HEMOGLOBIN (HGB A1C): Hemoglobin A1C: 7.3

## 2014-10-31 MED ORDER — POLYETHYLENE GLYCOL 3350 17 GM/SCOOP PO POWD: 17.0000 g | Freq: Two times a day (BID) | ORAL | Status: DC | PRN

## 2014-10-31 MED ORDER — COLLAGENASE 250 UNIT/GM EX OINT: 1.0000 "application " | TOPICAL_OINTMENT | Freq: Every day | CUTANEOUS | Status: DC

## 2014-10-31 NOTE — Patient Instructions (Addendum)
Medicare Annual Wellness Visit  [facility] and the medical providers at [facility] strive to bring you the best medical care.  In doing so we not only want to address your current medical conditions and concerns but also to detect new conditions early and prevent illness, disease and health-related problems.    Medicare offers a yearly Wellness Visit which allows our clinical staff to assess your need for preventative services including immunizations, lifestyle education, counseling to decrease risk of preventable diseases and screening for fall risk and other medical concerns.    This visit is provided free of charge (no copay) for all Medicare recipients. The clinical pharmacists at [facility] have begun to conduct these Wellness Visits which will also include a thorough review of all your medications.    As you primary medical provider recommend that you make an appointment for your Annual Wellness Visit if you have not done so already this year.  You may set up this appointment before you leave today or you may call back [phone] and schedule an appointment.  Please make sure when you call that you mention that you are scheduling your Annual Wellness Visit with the clinical pharmacist so that the appointment may be made for the proper length of time.    Continue current medications. Continue good therapeutic lifestyle changes which include good diet and exercise. Fall precautions discussed with patient. If an FOBT was given today- please return it to our front desk. If you are over 34 years old - you may need Prevnar 66 or the adult Pneumonia vaccine.  **Flu shots will be available soon--- please call and schedule a FLU-CLINIC appointment**  After your visit with [place] today you will receive a survey in the mail or online from [employer] regarding your care with [place]. Please take a moment to fill this out. Your feedback is very  important to [place] as you can help [place] better understand your patient needs as well as improve your experience and satisfaction. WE CARE ABOUT YOU!!!   **Please join [place] SEPT.[email or web address], 2016 from 5:00 to 7:00pm for our OPEN HOUSE! Come out and meet our NEW providers** The patient should continue to follow closely the pressure sores on his buttocks. He should arrange an appointment with the wound clinic if he and his wife feel like they're not improving or getting bigger. He should also get a doughnut cushion in addition to the current dressings he is using and see if this would help some by taking the pressure off of his buttocks. He should continue to drink plenty of fluids.

## 2014-10-31 NOTE — Addendum Note (Signed)
Addended by: Earlene Plater on: 10/31/2014 04:25 PM   Modules accepted: Orders

## 2014-10-31 NOTE — Progress Notes (Addendum)
Subjective:    Patient ID: [name]., male    DOB: 11-27-1930, 81 y.o.   MRN: [id number]  HPI Pt here for follow up and management of chronic medical problems which includes diabetes, hypertension, hyperlipidemia. He is taking medications regularly. The patient brings in blood sugars for review and they are running anywhere from the lower 100s up to as high as 258. These readings will be scanned into the record and these are for August. The patient sees the cardiologist regularly. He is due today to have a rectal exam and to be given an FOBT to return. He is also due for chest x-ray and lab work. He complains today of some soreness on his left foot and some sores on his buttocks. He is requesting 2 refills today. The patient is pleasant as usual. He comes to the visit today and his rolling wheelchair with a seat. He indicates that his wife is taking care of his decubiti on both buttocks and if they're actually improving some. He is due to get a chest x-ray as well as lab work. He is followed by Dr. Benjamine Mola regularly.           Patient Active Problem List   Diagnosis Date Noted  . Chronic diastolic CHF (congestive heart failure) 09/22/2013  . Constipation 07/05/2013  . Cellulitis 01/19/2013  . Abscess or cellulitis of gluteal region 01/19/2013  . UTI (lower urinary tract infection) 01/19/2013  . Urinary incontinence 12/13/2012  . External hemorrhoids with complication 25/36/6440  . Chronic constipation 11/29/2012  . Metabolic syndrome 34/74/2595  . Hemorrhoid prolapse 11/29/2012  . Spina bifida 11/18/2012  . CAD, NATIVE VESSEL 03/08/2009  . Type 2 diabetes mellitus with diabetic nephropathy 10/29/2008  . MALNUTRITION, PROTEIN-CALORIE 10/29/2008  . HYPERLIPIDEMIA 10/29/2008  . HYPOKALEMIA 10/29/2008  . ANEMIA 10/29/2008  . PTSD 10/29/2008  . HYPERTENSION, UNSPECIFIED 10/29/2008  . Chronic kidney disease 10/29/2008   Outpatient Encounter Prescriptions as of 10/31/2014    Medication Sig  . amoxicillin-clavulanate (AUGMENTIN) 875-125 MG per tablet Take 1 tablet by mouth 2 (two) times daily. With food  . aspirin 81 MG tablet Take 81 mg by mouth daily.    Marland Kitchen buPROPion (WELLBUTRIN XL) 300 MG 24 hr tablet TAKE 1 TABLET EVERY MORNING  . Cholecalciferol (VITAMIN D-3) 5000 UNITS TABS Take 1 tablet by mouth daily.    . Coenzyme Q10 (CO Q-10) 100 MG CAPS Take 1 capsule by mouth 2 (two) times daily.   Marland Kitchen ezetimibe-simvastatin (VYTORIN) 10-20 MG per tablet TAKE 1 TABLET DAILYqpm  . furosemide (LASIX) 20 MG tablet TAKE 1 TABLET EVERY MORNING  . glipiZIDE (GLUCOTROL XL) 10 MG 24 hr tablet TAKE 1 TABLET DAILY  . metoprolol tartrate (LOPRESSOR) 25 MG tablet TAKE 1 TABLET TWICE A DAY  . Multiple Vitamins-Minerals (PRESERVISION AREDS 2 PO) Take 1 capsule by mouth 2 (two) times daily.  . nitroGLYCERIN (NITROSTAT) 0.4 MG SL tablet Place 1 tablet (0.4 mg total) under the tongue every 5 (five) minutes as needed.  . NON FORMULARY cran-actin 173m 1 tablet daily  . NON FORMULARY Sweetish Bitters Daily  . Omega-3 Fatty Acids (FISH OIL) 1000 MG CAPS Take 1 capsule by mouth 2 (two) times daily.    . polyethylene glycol powder (GLYCOLAX/MIRALAX) powder Take 17 g by mouth 2 (two) times daily as needed.  . sitaGLIPtin-metformin (JANUMET) 50-500 MG per tablet Take 1 tablet by mouth once daily with food  . sodium bicarbonate 650 MG tablet Take 650 mg by  mouth 3 (three) times daily.  Marland Kitchen VYTORIN 10-20 MG per tablet TAKE 1 TABLET DAILY AS DIRECTED   No facility-administered encounter medications on file as of 10/31/2014.       Review of Systems  Constitutional: Negative.   HENT: Negative.   Eyes: Negative.   Respiratory: Negative.   Cardiovascular: Negative.   Gastrointestinal: Negative.   Endocrine: Negative.   Genitourinary: Negative.   Musculoskeletal: Negative.   Skin: Negative.        Sore on bottom  Red spot on bottom of left foot   Allergic/Immunologic: Negative.    Neurological: Negative.   Hematological: Negative.   Psychiatric/Behavioral: Negative.        Objective:   Physical Exam  Constitutional: He is oriented to person, place, and time. He appears well-developed and well-nourished.  The patient is pleasant and alert.  HENT:  Head: Normocephalic and atraumatic.  Right Ear: External ear normal.  Left Ear: External ear normal.  Nose: Nose normal.  Mouth/Throat: Oropharynx is clear and moist. No oropharyngeal exudate.  Eyes: Conjunctivae and EOM are normal. Pupils are equal, round, and reactive to light. Right eye exhibits no discharge. Left eye exhibits no discharge. No scleral icterus.  Neck: Normal range of motion. Neck supple. No thyromegaly present.  No anterior cervical adenopathy carotid bruits or thyromegaly  Cardiovascular: Normal rate, regular rhythm and normal heart sounds.  Exam reveals no gallop and no friction rub.   No murmur heard. Pedal pulses were difficult to palpate bilaterally Heart is regular today at 72/m  Pulmonary/Chest: Effort normal and breath sounds normal. No respiratory distress. He has no wheezes. He has no rales. He exhibits no tenderness.  The chest is clear anteriorly and posteriorly without congestion or rales  Abdominal: Soft. Bowel sounds are normal. He exhibits no mass. There is no tenderness. There is no rebound and no guarding.  The abdomen was nontender without enlargement or masses or bruits  Genitourinary: Rectum normal and prostate normal.  The rectal exam was normal without apparent prostate enlargement palpable today. There were no masses per rectum.  Musculoskeletal: He exhibits no edema or tenderness.  The patient is using his rolling walker with seat.  Lymphadenopathy:    He has no cervical adenopathy.  Neurological: He is alert and oriented to person, place, and time.  Skin: Skin is warm and dry. Rash noted. There is erythema. No pallor.  The patient he currently has some pressure sores on  each buttock with a larger sores being on the lower end of the buttocks on each side. There is no purulent drainage, but there is moderate drainage to the dressings which were reviewed when they were removed to look at the wounds and they look like they are healing but the patient says it is slow. These are full-thickness pressure sores.  Psychiatric: He has a normal mood and affect. His behavior is normal. Judgment and thought content normal.  Nursing note and vitals reviewed.  addended - skin : there are #3 sores on each buttock which means there are 6 total. The top sore bilaterally is approx 1/2 in diameter. The middle sore bilaterally is approx 3/4 in in diameter. The largest and bottom sore bilaterally is approx 1 and 1/4 in in diameter. There is moderate drainage from all areas.   BP 144/71 mmHg  Pulse 78  Temp(Src) 97.2 F (36.2 C) (Oral)  Wt 217 lb (98.431 kg)  [facility] reading (PRIMARY) by  Dr. Brunilda Payor x-ray   --- previous bypass  surgery , degenerative changes in spine otherwise no active disease                                   Assessment & Plan:  1. Type 2 diabetes mellitus with diabetic nephropathy -The patient's blood sugars were reviewed during the visit. Although there are some high readings and some low readings average seems to be in the 120 to 1:30 range. - POCT glycosylated hemoglobin (Hb A1C) - BMP8+EGFR - CBC with Differential/Platelet  2. Vitamin D deficiency -He will continue his current vitamin D dosing pending results of lab work - CBC with Differential/Platelet - Vit D  25 hydroxy (rtn osteoporosis monitoring)  3. Essential hypertension -The blood pressure is slightly elevated today but there will be no change in treatment. - BMP8+EGFR - CBC with Differential/Platelet - Hepatic function panel - DG Chest 2 View; Future  4. Hyperlipemia -He will continue with his current treatment pending results of lab work - CBC with Differential/Platelet - NMR,  lipoprofile - DG Chest 2 View; Future  5. Atherosclerosis of native coronary artery of native heart without angina pectoris -He will continue his regular follow-up with the cardiologist - CBC with Differential/Platelet - NMR, lipoprofile - DG Chest 2 View; Future  6. Chronic diastolic CHF (congestive heart failure) -Continue regular follow-up with cardiology - CBC with Differential/Platelet  7. Prostate cancer screening -The prostate did not appear to be enlarged and there were no rectal masses palpated. - CBC with Differential/Platelet - POCT UA - Microscopic Only - POCT urinalysis dipstick  8. Chronic constipation -Patient will continue with his current treatment. - polyethylene glycol powder (GLYCOLAX/MIRALAX) powder; Take 17 g by mouth 2 (two) times daily as needed.  Dispense: 3350 g; Refill: 3  9. Sacral wound, subsequent encounter -He will continue with the current dressings and he understands he should make an appointment with the wound center if these buttock breakdown areas do not continue to improve - For home use only DME Other see comment  10. Spina bifida -He appears to be stable with this.  11. Decubitus ulcer, buttock, right, stage II -Continue with current dressings  12. PTSD -He had no concerns with this or worries with this today.  Meds ordered this encounter  Medications  . polyethylene glycol powder (GLYCOLAX/MIRALAX) powder    Sig: Take 17 g by mouth 2 (two) times daily as needed.    Dispense:  3350 g    Refill:  3  . collagenase (SANTYL) ointment    Sig: Apply 1 application topically daily.    Dispense:  90 g    Refill:  3   Patient Instructions                       Medicare Annual Wellness Visit  [facility] and the medical providers at [facility] strive to bring you the best medical care.  In doing so we not only want to address your current medical conditions and concerns but also to detect new conditions early and  prevent illness, disease and health-related problems.    Medicare offers a yearly Wellness Visit which allows our clinical staff to assess your need for preventative services including immunizations, lifestyle education, counseling to decrease risk of preventable diseases and screening for fall risk and other medical concerns.    This visit is provided free of charge (no copay) for all Medicare recipients.  The clinical pharmacists at [facility] have begun to conduct these Wellness Visits which will also include a thorough review of all your medications.    As you primary medical provider recommend that you make an appointment for your Annual Wellness Visit if you have not done so already this year.  You may set up this appointment before you leave today or you may call back ([phone]) and schedule an appointment.  Please make sure when you call that you mention that you are scheduling your Annual Wellness Visit with the clinical pharmacist so that the appointment may be made for the proper length of time.    Continue current medications. Continue good therapeutic lifestyle changes which include good diet and exercise. Fall precautions discussed with patient. If an FOBT was given today- please return it to our front desk. If you are over 42 years old - you may need Prevnar 68 or the adult Pneumonia vaccine.  **Flu shots will be available soon--- please call and schedule a FLU-CLINIC appointment**  After your visit with [place] today you will receive a survey in the mail or online from [employer] regarding your care with [place]. Please take a moment to fill this out. Your feedback is very important to [place] as you can help [place] better understand your patient needs as well as improve your experience and satisfaction. WE CARE ABOUT YOU!!!   **Please join [place] SEPT.[email or web address], 2016 from 5:00 to 7:00pm for our OPEN HOUSE! Come out and meet our NEW providers** The patient should continue to follow  closely the pressure sores on his buttocks. He should arrange an appointment with the wound clinic if he and his wife feel like they're not improving or getting bigger. He should also get a doughnut cushion in addition to the current dressings he is using and see if this would help some by taking the pressure off of his buttocks. He should continue to drink plenty of fluids.   Arrie Senate MD

## 2014-11-01 ENCOUNTER — Telehealth: Payer: Self-pay | Admitting: Family Medicine

## 2014-11-01 LAB — NMR, LIPOPROFILE
CHOLESTEROL: 161 mg/dL (ref 100–199)
HDL Cholesterol by NMR: 49 mg/dL (ref >39)
HDL PARTICLE NUMBER: 35 umol/L (ref >=30.5)
LDL PARTICLE NUMBER: 1285 nmol/L — AB (ref <1000)
LDL Size: 20.4 nm (ref >20.5)
LDL-C: 89 mg/dL (ref 0–99)
LP-IR SCORE: 31 (ref <=45)
Small LDL Particle Number: 662 nmol/L — ABNORMAL HIGH (ref <=527)
TRIGLYCERIDES BY NMR: 117 mg/dL (ref 0–149)

## 2014-11-01 LAB — HEPATIC FUNCTION PANEL
ALBUMIN: 4.2 g/dL (ref 3.5–4.7)
ALK PHOS: 81 IU/L (ref 39–117)
ALT: 14 IU/L (ref 0–44)
AST: 15 IU/L (ref 0–40)
BILIRUBIN, DIRECT: 0.2 mg/dL (ref 0.00–0.40)
Bilirubin Total: 0.6 mg/dL (ref 0.0–1.2)
TOTAL PROTEIN: 6.7 g/dL (ref 6.0–8.5)

## 2014-11-01 LAB — CBC WITH DIFFERENTIAL/PLATELET
BASOS ABS: 0 10*3/uL (ref 0.0–0.2)
Basos: 0 %
EOS (ABSOLUTE): 0.1 10*3/uL (ref 0.0–0.4)
Eos: 1 %
Hematocrit: 44.1 % (ref 37.5–51.0)
Hemoglobin: 14.7 g/dL (ref 12.6–17.7)
IMMATURE GRANS (ABS): 0 10*3/uL (ref 0.0–0.1)
Immature Granulocytes: 0 %
LYMPHS: 19 %
Lymphocytes Absolute: 2.2 10*3/uL (ref 0.7–3.1)
MCH: 30.1 pg (ref 26.6–33.0)
MCHC: 33.3 g/dL (ref 31.5–35.7)
MCV: 90 fL (ref 79–97)
Monocytes Absolute: 1 10*3/uL — ABNORMAL HIGH (ref 0.1–0.9)
Monocytes: 9 %
NEUTROS ABS: 8.5 10*3/uL — AB (ref 1.4–7.0)
Neutrophils: 71 %
PLATELETS: 230 10*3/uL (ref 150–379)
RBC: 4.88 x10E6/uL (ref 4.14–5.80)
RDW: 15.5 % — ABNORMAL HIGH (ref 12.3–15.4)
WBC: 11.9 10*3/uL — AB (ref 3.4–10.8)

## 2014-11-01 LAB — BMP8+EGFR
BUN / CREAT RATIO: 13 (ref 10–22)
BUN: 21 mg/dL (ref 8–27)
CHLORIDE: 97 mmol/L (ref 97–108)
CO2: 26 mmol/L (ref 18–29)
CREATININE: 1.6 mg/dL — AB (ref 0.76–1.27)
Calcium: 9.6 mg/dL (ref 8.6–10.2)
GFR calc Af Amer: 46 mL/min/{1.73_m2} — ABNORMAL LOW (ref >59)
GFR calc non Af Amer: 40 mL/min/{1.73_m2} — ABNORMAL LOW (ref >59)
GLUCOSE: 199 mg/dL — AB (ref 65–99)
Potassium: 5.6 mmol/L — ABNORMAL HIGH (ref 3.5–5.2)
SODIUM: 139 mmol/L (ref 134–144)

## 2014-11-01 LAB — VITAMIN D 25 HYDROXY (VIT D DEFICIENCY, FRACTURES): Vit D, 25-Hydroxy: 49.8 ng/mL (ref 30.0–100.0)

## 2014-11-02 ENCOUNTER — Other Ambulatory Visit: Payer: Self-pay | Admitting: *Deleted

## 2014-11-02 DIAGNOSIS — K5909 Other constipation: Secondary | ICD-10-CM

## 2014-11-02 MED ORDER — POLYETHYLENE GLYCOL 3350 17 GM/SCOOP PO POWD: 17.0000 g | Freq: Two times a day (BID) | ORAL | Status: DC | PRN

## 2014-11-03 ENCOUNTER — Telehealth: Payer: Self-pay | Admitting: Family Medicine

## 2014-11-03 NOTE — Telephone Encounter (Signed)
Insurance will only cover if a full thickness wound and some moderate drainage.   Your documentation doesn't support the above criteria.  "The patient he currently has some pressure sores on each buttock with a larger sores being on the lower end of the buttocks on each side. There is no purulent drainage and they look like they are healing but the patient says it is slow."  Patient notified. Do you have any suggestions?

## 2014-11-03 NOTE — Telephone Encounter (Signed)
Please review and advise.

## 2014-11-03 NOTE — Telephone Encounter (Signed)
Please try to get these dressings for this patient for his wound care as they have been working well and he is improving slowly but they are improving.

## 2014-11-05 LAB — URINE CULTURE

## 2014-11-06 ENCOUNTER — Other Ambulatory Visit: Payer: Self-pay | Admitting: *Deleted

## 2014-11-06 MED ORDER — LEVOFLOXACIN 250 MG PO TABS: ORAL_TABLET | ORAL | Status: DC

## 2014-11-06 NOTE — Telephone Encounter (Signed)
Order resent with notes from [facility]

## 2014-11-06 NOTE — Telephone Encounter (Signed)
I dont know this info,

## 2014-11-06 NOTE — Telephone Encounter (Signed)
Please send in order

## 2014-11-07 ENCOUNTER — Telehealth: Payer: Self-pay | Admitting: Family Medicine

## 2014-11-07 NOTE — Telephone Encounter (Signed)
We will talk to the supplier and make sure that we get everything correct on the order so that she can continue to get his wound care supplies

## 2014-11-07 NOTE — Telephone Encounter (Signed)
Will speak with [facility] and submit new order.

## 2014-11-16 ENCOUNTER — Encounter: Payer: Self-pay | Admitting: Family Medicine

## 2014-11-16 ENCOUNTER — Ambulatory Visit (INDEPENDENT_AMBULATORY_CARE_PROVIDER_SITE_OTHER): Payer: Medicare Other | Admitting: Family Medicine

## 2014-11-16 VITALS — BP 115/71 | HR 78 | Temp 97.4°F | Ht 68.0 in | Wt 210.0 lb

## 2014-11-16 DIAGNOSIS — R399 Unspecified symptoms and signs involving the genitourinary system: Secondary | ICD-10-CM

## 2014-11-16 DIAGNOSIS — Z23 Encounter for immunization: Secondary | ICD-10-CM

## 2014-11-16 DIAGNOSIS — Z0000 Encounter for general adult medical examination without abnormal findings: Secondary | ICD-10-CM

## 2014-11-16 LAB — POCT UA - MICROSCOPIC ONLY
Bacteria, U Microscopic: NEGATIVE
CRYSTALS, UR, HPF, POC: NEGATIVE
Casts, Ur, LPF, POC: NEGATIVE
MUCUS UA: NEGATIVE
RBC, URINE, MICROSCOPIC: NEGATIVE

## 2014-11-16 LAB — POCT URINALYSIS DIPSTICK
BILIRUBIN UA: NEGATIVE
Blood, UA: NEGATIVE
Glucose, UA: NEGATIVE
KETONES UA: NEGATIVE
Nitrite, UA: NEGATIVE
PROTEIN UA: NEGATIVE
SPEC GRAV UA: 1.01
Urobilinogen, UA: NEGATIVE
pH, UA: 7

## 2014-11-16 NOTE — Patient Instructions (Signed)
Great to meet you!  I would treat your urine as Asymptomatic bacteuria (meaning bacteria in the urine that are not causing a problem), if you were still using a catheter we would be more aggressive. If you have symptoms of a UTI please let us know right away  Ty to go back to urology in the next month or so, we can send a referral if needed.   I would be glad to send physical therapy as well if you change your mind.,   Please come back for your flu shot in 2 weeks, you can make a nurse visit.

## 2014-11-16 NOTE — Progress Notes (Signed)
Subjective:    [name]. is a 81 y.o. male who presents for Medicare Annual/Subsequent preventive examination.   Preventive Screening-Counseling & Management  Tobacco History  Smoking status  . Former Smoker -- 2.00 packs/day for 20 years  . Types: Cigarettes  . Start date: 11/20/1948  . Quit date: 08/19/1980  Smokeless tobacco  . Former Systems developer  . Types: Chew    Comment: 01/20/2013 "quit chewing 20 yr ago"    Problems Prior to Visit 1. See below  Recent UTI, had a acinobacteror and enterococcus grow in his urine and has recently finished her Levaquin course. Denies any dysuria, foul-smelling urine, or abdominal pain at this time. He does not catheterize himself anymore but did previously. He urinates at least 2-3 times a day easily.  Current Problems (verified) Patient Active Problem List   Diagnosis Date Noted  . Decubitus ulcer, buttock 10/31/2014  . Chronic diastolic CHF (congestive heart failure) 09/22/2013  . Constipation 07/05/2013  . Cellulitis 01/19/2013  . Abscess or cellulitis of gluteal region 01/19/2013  . UTI (lower urinary tract infection) 01/19/2013  . Urinary incontinence 12/13/2012  . External hemorrhoids with complication [id number]  . Chronic constipation 11/29/2012  . Metabolic syndrome [id number]  . Hemorrhoid prolapse 11/29/2012  . Spina bifida 11/18/2012  . CAD, NATIVE VESSEL 03/08/2009  . Type 2 diabetes mellitus with diabetic nephropathy 10/29/2008  . MALNUTRITION, PROTEIN-CALORIE 10/29/2008  . HYPERLIPIDEMIA 10/29/2008  . HYPOKALEMIA 10/29/2008  . ANEMIA 10/29/2008  . PTSD 10/29/2008  . HYPERTENSION, UNSPECIFIED 10/29/2008  . Chronic kidney disease 10/29/2008    Medications Prior to Visit Current Outpatient Prescriptions on File Prior to Visit  Medication Sig Dispense Refill  . aspirin 81 MG tablet Take 81 mg by mouth daily.      Marland Kitchen buPROPion (WELLBUTRIN XL) 300 MG 24 hr tablet TAKE 1 TABLET EVERY MORNING 30 tablet 0  .  Cholecalciferol (VITAMIN D-3) 5000 UNITS TABS Take 1 tablet by mouth daily.      . Coenzyme Q10 (CO Q-10) 100 MG CAPS Take 1 capsule by mouth 2 (two) times daily.     . collagenase (SANTYL) ointment Apply 1 application topically daily. 90 g 3  . ezetimibe-simvastatin (VYTORIN) 10-20 MG per tablet TAKE 1 TABLET DAILYqpm    . furosemide (LASIX) 20 MG tablet TAKE 1 TABLET EVERY MORNING 90 tablet 0  . glipiZIDE (GLUCOTROL XL) 10 MG 24 hr tablet TAKE 1 TABLET DAILY 90 tablet 1  . metoprolol tartrate (LOPRESSOR) 25 MG tablet TAKE 1 TABLET TWICE A DAY 180 tablet 1  . Multiple Vitamins-Minerals (PRESERVISION AREDS 2 PO) Take 1 capsule by mouth 2 (two) times daily.    . nitroGLYCERIN (NITROSTAT) 0.4 MG SL tablet Place 1 tablet (0.4 mg total) under the tongue every 5 (five) minutes as needed. 25 tablet 6  . NON FORMULARY cran-actin 100mg  1 tablet daily    . NON FORMULARY Sweetish Bitters Daily    . Omega-3 Fatty Acids (FISH OIL) 1000 MG CAPS Take 1 capsule by mouth 2 (two) times daily.      . polyethylene glycol powder (GLYCOLAX/MIRALAX) powder Take 17 g by mouth 2 (two) times daily as needed. 850 g 3  . sitaGLIPtin-metformin (JANUMET) 50-500 MG per tablet Take 1 tablet by mouth once daily with food 90 tablet 3  . sodium bicarbonate 650 MG tablet Take 650 mg by mouth 3 (three) times daily.  4   No current facility-administered medications on file prior to visit.    Current  Medications (verified) Current Outpatient Prescriptions  Medication Sig Dispense Refill  . aspirin 81 MG tablet Take 81 mg by mouth daily.      Marland Kitchen buPROPion (WELLBUTRIN XL) 300 MG 24 hr tablet TAKE 1 TABLET EVERY MORNING 30 tablet 0  . Cholecalciferol (VITAMIN D-3) 5000 UNITS TABS Take 1 tablet by mouth daily.      . Coenzyme Q10 (CO Q-10) 100 MG CAPS Take 1 capsule by mouth 2 (two) times daily.     . collagenase (SANTYL) ointment Apply 1 application topically daily. 90 g 3  . ezetimibe-simvastatin (VYTORIN) 10-20 MG per tablet  TAKE 1 TABLET DAILYqpm    . furosemide (LASIX) 20 MG tablet TAKE 1 TABLET EVERY MORNING 90 tablet 0  . glipiZIDE (GLUCOTROL XL) 10 MG 24 hr tablet TAKE 1 TABLET DAILY 90 tablet 1  . metoprolol tartrate (LOPRESSOR) 25 MG tablet TAKE 1 TABLET TWICE A DAY 180 tablet 1  . Multiple Vitamins-Minerals (PRESERVISION AREDS 2 PO) Take 1 capsule by mouth 2 (two) times daily.    . nitroGLYCERIN (NITROSTAT) 0.4 MG SL tablet Place 1 tablet (0.4 mg total) under the tongue every 5 (five) minutes as needed. 25 tablet 6  . NON FORMULARY cran-actin 100mg  1 tablet daily    . NON FORMULARY Sweetish Bitters Daily    . Omega-3 Fatty Acids (FISH OIL) 1000 MG CAPS Take 1 capsule by mouth 2 (two) times daily.      . polyethylene glycol powder (GLYCOLAX/MIRALAX) powder Take 17 g by mouth 2 (two) times daily as needed. 850 g 3  . sitaGLIPtin-metformin (JANUMET) 50-500 MG per tablet Take 1 tablet by mouth once daily with food 90 tablet 3  . sodium bicarbonate 650 MG tablet Take 650 mg by mouth 3 (three) times daily.  4   No current facility-administered medications for this visit.     Allergies (verified) Aspirin; Sulfonamide derivatives; and Nitrofurantoin   PAST HISTORY  Family History Family History  Problem Relation Age of Onset  . Heart disease Mother   . Heart attack Mother   . Heart disease Father   . Heart attack Father   . Cancer Sister     breast  . Cancer Sister     lung and ovarian    Social History Social History  Substance Use Topics  . Smoking status: Former Smoker -- 2.00 packs/day for 20 years    Types: Cigarettes    Start date: 11/20/1948    Quit date: 08/19/1980  . Smokeless tobacco: Former User    Types: Chew     Comment: 01/20/2013 "quit chewing 20 yr ago"  . Alcohol Use: Yes     Comment: 01/20/2013 "quit drinking 08/19/1980"    Are there smokers in your home (other than you)?  No  Risk Factors Current exercise habits: The patient does not participate in regular exercise at  present.  Dietary issues discussed: none, cutting back on portion size   Cardiac risk factors: advanced age (older than 48 for men, 56 for women), diabetes mellitus, hypertension, male gender, obesity (BMI >= 30 kg/m2) and sedentary lifestyle.  Depression Screen (Note: if answer to either of the following is "Yes", a more complete depression screening is indicated)   Q1: Over the past two weeks, have you felt down, depressed or hopeless? Yes  Q2: Over the past two weeks, have you felt little interest or pleasure in doing things? No  Have you lost interest or pleasure in daily life? No  Do you often  feel hopeless? No  Do you cry easily over simple problems? Yes  Activities of Daily Living In your present state of health, do you have any difficulty performing the following activities?:  Driving? No Managing money?  No Feeding yourself? No Getting from bed to chair? Yes Climbing a flight of stairs? Yes Preparing food and eating?: No Bathing or showering? yes Getting dressed: Yes Getting to the toilet? No Using the toilet:No Moving around from place to place: No In the past year have you fallen or had a near fall?:yes   Are you sexually active?  No  Do you have more than one partner?  No  Hearing Difficulties: yes, background noises Do you often ask people to speak up or repeat themselves? No Do you experience ringing or noises in your ears? No Do you have difficulty understanding soft or whispered voices? No   Do you feel that you have a problem with memory? No  Do you often misplace items? No  Do you feel safe at home?  Yes  Cognitive Testing  Alert? Yes  Normal Appearance?Yes  Oriented to person? Yes  Place? Yes   Time? Yes  Recall of three objects?  No  2//3  Can perform simple calculations? Yes  Displays appropriate judgment?Yes  Can read the correct time from a watch face?Yes   Advanced Directives have been discussed with the patient? Yes   List the Names of  Other Physician/Practitioners you currently use: 1.    Indicate any recent Medical Services you may have received from other than [facility] providers in the past year (date may be approximate).  Immunization History  Administered Date(s) Administered  . Influenza,inj,Quad PF,36+ Mos 11/18/2012, 12/23/2013  . Pneumococcal Conjugate-13 03/08/2013    Screening Tests Health Maintenance  Topic Date Due  . ZOSTAVAX  11/20/1992  . URINE MICROALBUMIN  03/08/2014  . PNA vac Low Risk Adult (2 of 2 - PPSV23) 03/08/2014  . INFLUENZA VACCINE  09/25/2014  . HEMOGLOBIN A1C  04/30/2015  . FOOT EXAM  06/19/2015  . OPHTHALMOLOGY EXAM  08/30/2015  . COLONOSCOPY  12/25/2016  . TETANUS/TDAP  11/24/2020    All answers were reviewed with the patient and necessary referrals were made:  Kenn File, MD   11/16/2014   History reviewed: allergies, current medications, past family history, past medical history, past social history, past surgical history and problem list  Review of Systems Pertinent items are noted in HPI.    Objective:     Blood pressure 115/71, pulse 78, temperature 97.4 F (36.3 C), temperature source Oral, height 5\' 8"  (1.727 m), weight 210 lb (95.255 kg). Body mass index is 31.94 kg/(m^2).  Gen: NAD, alert, cooperative with exam, sitting on a rolling walker chair, moves legs easily but clear limitations HEENT: NCAT, Tms WNl BL CV: RRR, good S1/S2, no murmur Resp: CTABL, no wheezes, non-labored Ext: No edema, warm Neuro: Alert and oriented, No gross deficits       Assessment:     Mr. Cropley is a pleasant 81 year old male here for his Medicare annual wellness visit. He states that he's in good health and feels well today. He wants a follow-up urine test from his recent UTI. He does have some physical limitations and has fallen 3 times in the last year.  Frequent falls Offered physical therapy at in-home evaluation and home health physical therapy, he  declines.  Healthcare maintenance Pneumovax today, he declines zostavax  Recent UTI, asymptomaticbacteriuria  Urine today still with moderate leukocyte esterase  and 15-20 WBCs per high per field, Symptoms I will treat him is asymptomatic bacteriuria, however I would like him to check in with urology in case this is persistent or he has frequent recurrence of symptoms. Recommended calling for appointment, referral if needed     Plan:     During the course of the visit the patient was educated and counseled about appropriate screening and preventive services including:    Pneumococcal vaccine   Diet review for nutrition referral? no   Patient Instructions (the written plan) was given to the patient.  Medicare Attestation I have personally reviewed: The patient's medical and social history Their use of alcohol, tobacco or illicit drugs Their current medications and supplements The patient's functional ability including ADLs,fall risks, home safety risks, cognitive, and hearing and visual impairment Diet and physical activities Evidence for depression or mood disorders  The patient's weight, height, BMI, and visual acuity have been recorded in the chart.  I have made referrals, counseling, and provided education to the patient based on review of the above and I have provided the patient with a written personalized care plan for preventive services.     Kenn File, MD   11/16/2014

## 2014-11-16 NOTE — Addendum Note (Signed)
Addended by: Earlene Plater on: 11/16/2014 12:47 PM   Modules accepted: Orders

## 2014-11-17 LAB — URINE CULTURE: Organism ID, Bacteria: NO GROWTH

## 2014-11-21 DIAGNOSIS — E1151 Type 2 diabetes mellitus with diabetic peripheral angiopathy without gangrene: Secondary | ICD-10-CM | POA: Diagnosis not present

## 2014-11-21 DIAGNOSIS — B351 Tinea unguium: Secondary | ICD-10-CM | POA: Diagnosis not present

## 2014-11-21 DIAGNOSIS — L84 Corns and callosities: Secondary | ICD-10-CM | POA: Diagnosis not present

## 2014-12-01 DIAGNOSIS — E559 Vitamin D deficiency, unspecified: Secondary | ICD-10-CM | POA: Diagnosis not present

## 2014-12-01 DIAGNOSIS — I10 Essential (primary) hypertension: Secondary | ICD-10-CM | POA: Diagnosis not present

## 2014-12-01 DIAGNOSIS — R809 Proteinuria, unspecified: Secondary | ICD-10-CM | POA: Diagnosis not present

## 2014-12-01 DIAGNOSIS — Z79899 Other long term (current) drug therapy: Secondary | ICD-10-CM | POA: Diagnosis not present

## 2014-12-01 DIAGNOSIS — N183 Chronic kidney disease, stage 3 (moderate): Secondary | ICD-10-CM | POA: Diagnosis not present

## 2014-12-01 DIAGNOSIS — D649 Anemia, unspecified: Secondary | ICD-10-CM | POA: Diagnosis not present

## 2014-12-01 DIAGNOSIS — D509 Iron deficiency anemia, unspecified: Secondary | ICD-10-CM | POA: Diagnosis not present

## 2014-12-05 DIAGNOSIS — N2581 Secondary hyperparathyroidism of renal origin: Secondary | ICD-10-CM | POA: Diagnosis not present

## 2014-12-05 DIAGNOSIS — E875 Hyperkalemia: Secondary | ICD-10-CM | POA: Diagnosis not present

## 2014-12-05 DIAGNOSIS — E669 Obesity, unspecified: Secondary | ICD-10-CM | POA: Diagnosis not present

## 2014-12-05 DIAGNOSIS — I10 Essential (primary) hypertension: Secondary | ICD-10-CM | POA: Diagnosis not present

## 2014-12-05 DIAGNOSIS — N183 Chronic kidney disease, stage 3 (moderate): Secondary | ICD-10-CM | POA: Diagnosis not present

## 2014-12-06 ENCOUNTER — Other Ambulatory Visit: Payer: Self-pay | Admitting: Family Medicine

## 2015-01-07 ENCOUNTER — Other Ambulatory Visit: Payer: Self-pay | Admitting: Cardiology

## 2015-01-16 ENCOUNTER — Telehealth: Payer: Self-pay | Admitting: Family Medicine

## 2015-01-20 ENCOUNTER — Other Ambulatory Visit: Payer: Self-pay | Admitting: Family Medicine

## 2015-01-27 ENCOUNTER — Other Ambulatory Visit: Payer: Self-pay | Admitting: Family Medicine

## 2015-01-27 ENCOUNTER — Other Ambulatory Visit: Payer: Self-pay | Admitting: Cardiology

## 2015-01-30 DIAGNOSIS — H532 Diplopia: Secondary | ICD-10-CM | POA: Diagnosis not present

## 2015-01-30 DIAGNOSIS — H353221 Exudative age-related macular degeneration, left eye, with active choroidal neovascularization: Secondary | ICD-10-CM | POA: Diagnosis not present

## 2015-01-30 DIAGNOSIS — H5010 Unspecified exotropia: Secondary | ICD-10-CM | POA: Diagnosis not present

## 2015-01-31 ENCOUNTER — Ambulatory Visit (INDEPENDENT_AMBULATORY_CARE_PROVIDER_SITE_OTHER): Payer: Medicare Other

## 2015-01-31 ENCOUNTER — Telehealth: Payer: Self-pay | Admitting: Family Medicine

## 2015-01-31 DIAGNOSIS — Z23 Encounter for immunization: Secondary | ICD-10-CM

## 2015-01-31 MED ORDER — BUPROPION HCL ER (XL) 300 MG PO TB24: 300.0000 mg | ORAL_TABLET | Freq: Every morning | ORAL | Status: DC

## 2015-01-31 NOTE — Telephone Encounter (Signed)
Done both

## 2015-02-02 ENCOUNTER — Other Ambulatory Visit: Payer: Self-pay | Admitting: *Deleted

## 2015-02-02 ENCOUNTER — Telehealth: Payer: Self-pay | Admitting: Family Medicine

## 2015-02-02 MED ORDER — BUPROPION HCL ER (XL) 300 MG PO TB24: 300.0000 mg | ORAL_TABLET | Freq: Every day | ORAL | Status: DC

## 2015-02-02 NOTE — Telephone Encounter (Signed)
Message given to nurse to call in asap.

## 2015-02-06 DIAGNOSIS — H353112 Nonexudative age-related macular degeneration, right eye, intermediate dry stage: Secondary | ICD-10-CM | POA: Diagnosis not present

## 2015-02-06 DIAGNOSIS — H353221 Exudative age-related macular degeneration, left eye, with active choroidal neovascularization: Secondary | ICD-10-CM | POA: Diagnosis not present

## 2015-02-06 LAB — HM DIABETES EYE EXAM

## 2015-02-08 ENCOUNTER — Encounter: Payer: Self-pay | Admitting: *Deleted

## 2015-02-13 DIAGNOSIS — B351 Tinea unguium: Secondary | ICD-10-CM | POA: Diagnosis not present

## 2015-02-13 DIAGNOSIS — L84 Corns and callosities: Secondary | ICD-10-CM | POA: Diagnosis not present

## 2015-02-13 DIAGNOSIS — E1151 Type 2 diabetes mellitus with diabetic peripheral angiopathy without gangrene: Secondary | ICD-10-CM | POA: Diagnosis not present

## 2015-02-22 DIAGNOSIS — H5034 Intermittent alternating exotropia: Secondary | ICD-10-CM | POA: Diagnosis not present

## 2015-03-08 ENCOUNTER — Other Ambulatory Visit: Payer: Self-pay | Admitting: Cardiology

## 2015-03-08 DIAGNOSIS — I6523 Occlusion and stenosis of bilateral carotid arteries: Secondary | ICD-10-CM

## 2015-03-09 DIAGNOSIS — H5034 Intermittent alternating exotropia: Secondary | ICD-10-CM | POA: Diagnosis not present

## 2015-03-14 ENCOUNTER — Ambulatory Visit (INDEPENDENT_AMBULATORY_CARE_PROVIDER_SITE_OTHER): Payer: Medicare Other | Admitting: Family Medicine

## 2015-03-14 ENCOUNTER — Encounter: Payer: Self-pay | Admitting: Family Medicine

## 2015-03-14 VITALS — BP 118/66 | HR 70 | Temp 97.0°F | Ht 68.0 in | Wt 220.0 lb

## 2015-03-14 DIAGNOSIS — I2510 Atherosclerotic heart disease of native coronary artery without angina pectoris: Secondary | ICD-10-CM

## 2015-03-14 DIAGNOSIS — Q669 Congenital deformity of feet, unspecified, unspecified foot: Secondary | ICD-10-CM

## 2015-03-14 DIAGNOSIS — R32 Unspecified urinary incontinence: Secondary | ICD-10-CM

## 2015-03-14 DIAGNOSIS — G609 Hereditary and idiopathic neuropathy, unspecified: Secondary | ICD-10-CM | POA: Diagnosis not present

## 2015-03-14 DIAGNOSIS — E785 Hyperlipidemia, unspecified: Secondary | ICD-10-CM

## 2015-03-14 DIAGNOSIS — M9901 Segmental and somatic dysfunction of cervical region: Secondary | ICD-10-CM | POA: Diagnosis not present

## 2015-03-14 DIAGNOSIS — Q057 Lumbar spina bifida without hydrocephalus: Secondary | ICD-10-CM | POA: Diagnosis not present

## 2015-03-14 DIAGNOSIS — M9903 Segmental and somatic dysfunction of lumbar region: Secondary | ICD-10-CM | POA: Diagnosis not present

## 2015-03-14 DIAGNOSIS — L89159 Pressure ulcer of sacral region, unspecified stage: Secondary | ICD-10-CM | POA: Diagnosis not present

## 2015-03-14 DIAGNOSIS — E559 Vitamin D deficiency, unspecified: Secondary | ICD-10-CM | POA: Diagnosis not present

## 2015-03-14 DIAGNOSIS — I6523 Occlusion and stenosis of bilateral carotid arteries: Secondary | ICD-10-CM

## 2015-03-14 DIAGNOSIS — I10 Essential (primary) hypertension: Secondary | ICD-10-CM

## 2015-03-14 DIAGNOSIS — R638 Other symptoms and signs concerning food and fluid intake: Secondary | ICD-10-CM

## 2015-03-14 DIAGNOSIS — I739 Peripheral vascular disease, unspecified: Secondary | ICD-10-CM | POA: Diagnosis not present

## 2015-03-14 DIAGNOSIS — E1121 Type 2 diabetes mellitus with diabetic nephropathy: Secondary | ICD-10-CM

## 2015-03-14 DIAGNOSIS — M9902 Segmental and somatic dysfunction of thoracic region: Secondary | ICD-10-CM | POA: Diagnosis not present

## 2015-03-14 DIAGNOSIS — M5137 Other intervertebral disc degeneration, lumbosacral region: Secondary | ICD-10-CM | POA: Diagnosis not present

## 2015-03-14 LAB — POCT GLYCOSYLATED HEMOGLOBIN (HGB A1C): Hemoglobin A1C: 6.7

## 2015-03-14 NOTE — Patient Instructions (Addendum)
Medicare Annual Wellness Visit  [facility] and the medical providers at [facility] strive to bring you the best medical care.  In doing so we not only want to address your current medical conditions and concerns but also to detect new conditions early and prevent illness, disease and health-related problems.    Medicare offers a yearly Wellness Visit which allows our clinical staff to assess your need for preventative services including immunizations, lifestyle education, counseling to decrease risk of preventable diseases and screening for fall risk and other medical concerns.    This visit is provided free of charge (no copay) for all Medicare recipients. The clinical pharmacists at [facility] have begun to conduct these Wellness Visits which will also include a thorough review of all your medications.    As you primary medical provider recommend that you make an appointment for your Annual Wellness Visit if you have not done so already this year.  You may set up this appointment before you leave today or you may call back [phone]) and schedule an appointment.  Please make sure when you call that you mention that you are scheduling your Annual Wellness Visit with the clinical pharmacist so that the appointment may be made for the proper length of time.     Continue current medications. Continue good therapeutic lifestyle changes which include good diet and exercise. Fall precautions discussed with patient. If an FOBT was given today- please return it to our front desk. If you are over 22 years old - you may need Prevnar 58 or the adult Pneumonia vaccine.  **Flu shots are available--- please call and schedule a FLU-CLINIC appointment**  After your visit with [place] today you will receive a survey in the mail or online from [employer] regarding your care with [place]. Please take a moment to fill this out. Your feedback is very  important to [place] as you can help [place] better understand your patient needs as well as improve your experience and satisfaction. WE CARE ABOUT YOU!!!    the patient should continue the daily dressings to his bilateral buttock decubiti  He should continue to monitor his feet daily wear his diabetic shoes because of the deformities in his feet and his lack of sensation in his feet as well as his lack of good pulses.

## 2015-03-14 NOTE — Progress Notes (Addendum)
Subjective:    Patient ID: Bobby Lamas., male    DOB: [birth year], 82 y.o.   MRN: [id number]  Hyperlipidemia  Diabetes  Hypertension    Pt here for follow up and management of chronic medical problems which includes diabetes, hypertension and hyperlipidemia. He is taking medications regularly. The patient continues to have his chronic sacral decubiti. He is followed by the cardiologist. He is requesting a refill on past for his diabetic shoes. He is to return an FOBT. He will get lab work today. We will do a the urine microscopic today. The patient has chronic deformities of both feet with very high arches and flexor deformities of the toes. These are congenital deformities secondary to his spinal bifida. The patient sees the cardiologist regularly. He has an appointment coming up in March as well as carotid Dopplers planned a month before that. He is not having any chest pain or shortness of breath anymore than usual. He denies any problems with heartburn indigestion and nausea vomiting diarrhea blood in the stool or black tarry bowel movements. He is still urinary incontinence because of the spina bifida and wears a depends daily for this. His wife is changing the dressings on his bilateral decubiti on his buttocks daily using cream and dressings. He uses a walker at home but is primarily confined to his rolling seated wheelchair. The patient's blood sugars were reviewed on his phone and it appears that the majority of these blood sugars are running in the 120 range with some outliers down to 70 and as high as 200.      Patient Active Problem List   Diagnosis Date Noted  . Decubitus ulcer, buttock 10/31/2014  . Chronic diastolic CHF (congestive heart failure) ([place]) 09/22/2013  . Constipation 07/05/2013  . Cellulitis 01/19/2013  . Abscess or cellulitis of gluteal region 01/19/2013  . UTI (lower urinary tract infection) 01/19/2013  . Urinary incontinence 12/13/2012  . External  hemorrhoids with complication 19/62/2297  . Chronic constipation 11/29/2012  . Metabolic syndrome 98/92/1194  . Hemorrhoid prolapse 11/29/2012  . Spina bifida ([place]) 11/18/2012  . CAD, NATIVE VESSEL 03/08/2009  . Type 2 diabetes mellitus with diabetic nephropathy ([place]) 10/29/2008  . MALNUTRITION, PROTEIN-CALORIE 10/29/2008  . HYPERLIPIDEMIA 10/29/2008  . HYPOKALEMIA 10/29/2008  . ANEMIA 10/29/2008  . PTSD 10/29/2008  . HYPERTENSION, UNSPECIFIED 10/29/2008  . Chronic kidney disease 10/29/2008   Outpatient Encounter Prescriptions as of 03/14/2015  Medication Sig  . aspirin 81 MG tablet Take 81 mg by mouth daily.    Marland Kitchen buPROPion (WELLBUTRIN XL) 300 MG 24 hr tablet Take 1 tablet (300 mg total) by mouth daily.  . Cholecalciferol (VITAMIN D-3) 5000 UNITS TABS Take 1 tablet by mouth daily.    . Coenzyme Q10 (CO Q-10) 100 MG CAPS Take 1 capsule by mouth 2 (two) times daily.   . collagenase (SANTYL) ointment Apply 1 application topically daily.  Marland Kitchen ezetimibe-simvastatin (VYTORIN) 10-20 MG per tablet TAKE 1 TABLET DAILYqpm  . furosemide (LASIX) 20 MG tablet Take 1 tablet (20 mg total) by mouth every morning.  Marland Kitchen glipiZIDE (GLUCOTROL XL) 10 MG 24 hr tablet TAKE 1 TABLET DAILY  . JANUMET 50-500 MG tablet TAKE 1 TABLET DAILY WITH FOOD  . metoprolol tartrate (LOPRESSOR) 25 MG tablet Take 1 tablet (25 mg total) by mouth 2 (two) times daily.  . Multiple Vitamins-Minerals (PRESERVISION AREDS 2 PO) Take 1 capsule by mouth 2 (two) times daily.  . nitroGLYCERIN (NITROSTAT) 0.4 MG SL tablet Place 1  tablet (0.4 mg total) under the tongue every 5 (five) minutes as needed.  . NON FORMULARY cran-actin '100mg'$  1 tablet daily  . NON FORMULARY Sweetish Bitters Daily  . Omega-3 Fatty Acids (FISH OIL) 1000 MG CAPS Take 1 capsule by mouth 2 (two) times daily.    . polyethylene glycol powder (GLYCOLAX/MIRALAX) powder Take 17 g by mouth 2 (two) times daily as needed.  . sodium bicarbonate 650 MG tablet Take 650 mg by  mouth 3 (three) times daily.  Marland Kitchen VYTORIN 10-20 MG tablet TAKE 1 TABLET DAILY AS DIRECTED   No facility-administered encounter medications on file as of 03/14/2015.     Review of Systems  Constitutional: Negative.   HENT: Negative.   Eyes: Negative.   Respiratory: Negative.   Cardiovascular: Negative.   Gastrointestinal: Negative.   Endocrine: Negative.   Genitourinary: Negative.   Musculoskeletal: Negative.   Skin: Positive for wound (ulcer to bilateral buttocks).  Allergic/Immunologic: Negative.   Neurological: Negative.   Hematological: Negative.   Psychiatric/Behavioral: Negative.        Objective:   Physical Exam  Constitutional: He is oriented to person, place, and time. He appears well-developed and well-nourished. No distress.  The patient is alert and is in his rolling walker that has a seat. He was unable to stand without assistance.  HENT:  Head: Normocephalic and atraumatic.  Right Ear: External ear normal.  Left Ear: External ear normal.  Nose: Nose normal.  Mouth/Throat: Oropharynx is clear and moist. No oropharyngeal exudate.  Eyes: Conjunctivae are normal. Pupils are equal, round, and reactive to light. Right eye exhibits no discharge. Left eye exhibits no discharge. No scleral icterus.  He sees the eye doctor regularly and has surgery planned for his right eye because of ocular motor issues. He also has decreased vision in the left eye.  Neck: Normal range of motion. Neck supple. No thyromegaly present.  Cardiovascular: Normal rate, regular rhythm and normal heart sounds.   No murmur heard. Distal pulses in the feet were difficult to palpate The heart has a regular rate and rhythm at 72/m  Pulmonary/Chest: Effort normal and breath sounds normal. No respiratory distress. He has no wheezes. He has no rales. He exhibits no tenderness.  Clear anteriorly and posteriorly  Abdominal: Soft. Bowel sounds are normal. He exhibits no mass. There is no tenderness. There is  no rebound and no guarding.  No abdominal tenderness masses or bruits audible  Genitourinary:  There are buttock decubiti and erythema bilaterally with the largest ulcer being on the left buttock and a smaller ulcer on the right buttock there is minimal redness but ulcers persist and dressings should be continued as doing  Musculoskeletal: He exhibits no edema or tenderness.  The patient has trouble walking and standing without assistance because of his spinal bifida  Lymphadenopathy:    He has no cervical adenopathy.  Neurological: He is alert and oriented to person, place, and time. No cranial nerve deficit.  He has diminished sensation on both feet with decreased pulses  Skin: Skin is warm and dry. Rash noted. There is erythema. No pallor.  The patient has medial bilateral buttock decubiti and ulcers with the left being worse than the right and there is some surrounding erythema.  Psychiatric: He has a normal mood and affect. His behavior is normal. Judgment and thought content normal.  Nursing note and vitals reviewed.  BP 118/66 mmHg  Pulse 70  Temp(Src) 97 F (36.1 C) (Oral)  Ht '5\' 8"'$  (1.727  m)  Wt 220 lb (99.791 kg)  BMI 33.46 kg/m2        Assessment & Plan:  1. Type 2 diabetes mellitus with diabetic nephropathy, without long-term current use of insulin (HCC) -Continue to monitor blood sugars regularly and keep feet checked regularly and get as much exercise as possible at home with his limited mobility - POCT glycosylated hemoglobin (Hb A1C) - BMP8+EGFR - Microalbumin / creatinine urine ratio - CBC with Differential/Platelet - DME Other see comment  2. Hyperlipemia -Continue current treatment pending results of lab work - CBC with Differential/Platelet - NMR, lipoprofile  3. Vitamin D deficiency -Continue with vitamin D replacement pending results of lab work - CBC with Differential/Platelet - VITAMIN D 25 Hydroxy (Vit-D Deficiency, Fractures)  4. Essential  hypertension -Blood pressure is good today he should continue with current treatment - BMP8+EGFR - CBC with Differential/Platelet - Hepatic function panel  5. Atherosclerosis of native coronary artery of native heart without angina pectoris -Continue aggressive therapeutic lifestyle changes statin drug and omega-3 fatty acids and as much exercise as possible and follow-up with cardiology - CBC with Differential/Platelet - NMR, lipoprofile  6. Sacral decubitus ulcer, unspecified pressure ulcer stage -Continue daily dressings and ointments and notify us if any significant worsening - DME Other see comment  7. Sacral pressure sore, unspecified pressure ulcer stage -Continue treatments as noted above - DME Other see comment  8. Foot deformities, congenital -Diabetic shoes  9. Peripheral vascular insufficiency (HCC) -Continue to monitor feet regularly and keep skin well hydrated  10. Hereditary and idiopathic peripheral neuropathy -Continue good blood sugar control and being careful not to fall  11. Urinary incontinence, unspecified incontinence type -Continue with depends and notify us of any symptoms and recheck urinalysis at that time  12. Spina bifida of lumbar region without hydrocephalus ([place]) -Continue current wheelchair use and treatment of decubiti on buttocks  13. Increased BMI -The patient's weight was discussed with him despite his spinal bifida and his inability to exercise regularly he does try to exercise his upper body as much as possible at home and he was encouraged to continue to do this. He is also encouraged to watch his diet as closely as possible.  No orders of the defined types were placed in this encounter.   Patient Instructions                       Medicare Annual Wellness Visit  [facility] and the medical providers at [facility] strive to bring you the best medical care.  In doing so we not only want to address your current  medical conditions and concerns but also to detect new conditions early and prevent illness, disease and health-related problems.    Medicare offers a yearly Wellness Visit which allows our clinical staff to assess your need for preventative services including immunizations, lifestyle education, counseling to decrease risk of preventable diseases and screening for fall risk and other medical concerns.    This visit is provided free of charge (no copay) for all Medicare recipients. The clinical pharmacists at [facility] have begun to conduct these Wellness Visits which will also include a thorough review of all your medications.    As you primary medical provider recommend that you make an appointment for your Annual Wellness Visit if you have not done so already this year.  You may set up this appointment before you leave today or you may call back (  [phone] and schedule an appointment.  Please make sure when you call that you mention that you are scheduling your Annual Wellness Visit with the clinical pharmacist so that the appointment may be made for the proper length of time.     Continue current medications. Continue good therapeutic lifestyle changes which include good diet and exercise. Fall precautions discussed with patient. If an FOBT was given today- please return it to our front desk. If you are over 76 years old - you may need Prevnar 63 or the adult Pneumonia vaccine.  **Flu shots are available--- please call and schedule a FLU-CLINIC appointment**  After your visit with [place] today you will receive a survey in the mail or online from [employer] regarding your care with [place]. Please take a moment to fill this out. Your feedback is very important to [place] as you can help [place] better understand your patient needs as well as improve your experience and satisfaction. WE CARE ABOUT YOU!!!    the patient should continue the daily dressings to his bilateral buttock decubiti   He should continue to monitor his feet daily wear his diabetic shoes because of the deformities in his feet and his lack of sensation in his feet as well as his lack of good pulses.   Arrie Senate MD

## 2015-03-15 DIAGNOSIS — H353221 Exudative age-related macular degeneration, left eye, with active choroidal neovascularization: Secondary | ICD-10-CM | POA: Diagnosis not present

## 2015-03-15 LAB — CBC WITH DIFFERENTIAL/PLATELET
BASOS: 0 %
Basophils Absolute: 0 10*3/uL (ref 0.0–0.2)
EOS (ABSOLUTE): 0.1 10*3/uL (ref 0.0–0.4)
EOS: 1 %
HEMATOCRIT: 46 % (ref 37.5–51.0)
HEMOGLOBIN: 15.5 g/dL (ref 12.6–17.7)
Immature Grans (Abs): 0 10*3/uL (ref 0.0–0.1)
Immature Granulocytes: 0 %
LYMPHS ABS: 3.1 10*3/uL (ref 0.7–3.1)
Lymphs: 18 %
MCH: 31.4 pg (ref 26.6–33.0)
MCHC: 33.7 g/dL (ref 31.5–35.7)
MCV: 93 fL (ref 79–97)
MONOCYTES: 8 %
Monocytes Absolute: 1.3 10*3/uL — ABNORMAL HIGH (ref 0.1–0.9)
NEUTROS ABS: 12.2 10*3/uL — AB (ref 1.4–7.0)
Neutrophils: 73 %
Platelets: 222 10*3/uL (ref 150–379)
RBC: 4.94 x10E6/uL (ref 4.14–5.80)
RDW: 13.8 % (ref 12.3–15.4)
WBC: 16.8 10*3/uL — ABNORMAL HIGH (ref 3.4–10.8)

## 2015-03-15 LAB — HEPATIC FUNCTION PANEL
ALK PHOS: 79 IU/L (ref 39–117)
ALT: 15 IU/L (ref 0–44)
AST: 19 IU/L (ref 0–40)
Albumin: 4.5 g/dL (ref 3.5–4.7)
BILIRUBIN, DIRECT: 0.18 mg/dL (ref 0.00–0.40)
Bilirubin Total: 0.7 mg/dL (ref 0.0–1.2)
Total Protein: 7 g/dL (ref 6.0–8.5)

## 2015-03-15 LAB — NMR, LIPOPROFILE
Cholesterol: 151 mg/dL (ref 100–199)
HDL CHOLESTEROL BY NMR: 46 mg/dL (ref >39)
HDL Particle Number: 35.2 umol/L (ref >=30.5)
LDL Particle Number: 1063 nmol/L — ABNORMAL HIGH (ref <1000)
LDL Size: 20.3 nm (ref >20.5)
LDL-C: 77 mg/dL (ref 0–99)
LP-IR Score: 41 (ref <=45)
SMALL LDL PARTICLE NUMBER: 565 nmol/L — AB (ref <=527)
Triglycerides by NMR: 139 mg/dL (ref 0–149)

## 2015-03-15 LAB — BMP8+EGFR
BUN/Creatinine Ratio: 15 (ref 10–22)
BUN: 26 mg/dL (ref 8–27)
CALCIUM: 9.3 mg/dL (ref 8.6–10.2)
CHLORIDE: 95 mmol/L — AB (ref 96–106)
CO2: 22 mmol/L (ref 18–29)
Creatinine, Ser: 1.69 mg/dL — ABNORMAL HIGH (ref 0.76–1.27)
GFR calc non Af Amer: 37 mL/min/{1.73_m2} — ABNORMAL LOW (ref >59)
GFR, EST AFRICAN AMERICAN: 43 mL/min/{1.73_m2} — AB (ref >59)
Glucose: 180 mg/dL — ABNORMAL HIGH (ref 65–99)
POTASSIUM: 4.5 mmol/L (ref 3.5–5.2)
SODIUM: 140 mmol/L (ref 134–144)

## 2015-03-15 LAB — MICROALBUMIN / CREATININE URINE RATIO
Creatinine, Urine: 109.5 mg/dL
MICROALB/CREAT RATIO: 30 mg/g creat (ref 0.0–30.0)
Microalbumin, Urine: 32.8 ug/mL

## 2015-03-15 LAB — VITAMIN D 25 HYDROXY (VIT D DEFICIENCY, FRACTURES): VIT D 25 HYDROXY: 46.8 ng/mL (ref 30.0–100.0)

## 2015-03-21 ENCOUNTER — Ambulatory Visit (HOSPITAL_COMMUNITY)
Admission: RE | Admit: 2015-03-21 | Discharge: 2015-03-21 | Disposition: A | Discharge status: Home / Self Care | Payer: Medicare Other | Source: Ambulatory Visit | Attending: Cardiology | Admitting: Cardiology

## 2015-03-21 DIAGNOSIS — I6523 Occlusion and stenosis of bilateral carotid arteries: Secondary | ICD-10-CM | POA: Diagnosis not present

## 2015-03-21 DIAGNOSIS — I129 Hypertensive chronic kidney disease with stage 1 through stage 4 chronic kidney disease, or unspecified chronic kidney disease: Secondary | ICD-10-CM | POA: Insufficient documentation

## 2015-03-21 DIAGNOSIS — E785 Hyperlipidemia, unspecified: Secondary | ICD-10-CM | POA: Insufficient documentation

## 2015-03-21 DIAGNOSIS — N189 Chronic kidney disease, unspecified: Secondary | ICD-10-CM | POA: Insufficient documentation

## 2015-03-21 DIAGNOSIS — E1122 Type 2 diabetes mellitus with diabetic chronic kidney disease: Secondary | ICD-10-CM | POA: Diagnosis not present

## 2015-03-23 DIAGNOSIS — H5034 Intermittent alternating exotropia: Secondary | ICD-10-CM | POA: Diagnosis not present

## 2015-03-26 ENCOUNTER — Ambulatory Visit: Payer: Self-pay | Admitting: Ophthalmology

## 2015-03-26 DIAGNOSIS — Z79899 Other long term (current) drug therapy: Secondary | ICD-10-CM | POA: Diagnosis not present

## 2015-03-26 DIAGNOSIS — D509 Iron deficiency anemia, unspecified: Secondary | ICD-10-CM | POA: Diagnosis not present

## 2015-03-26 DIAGNOSIS — N183 Chronic kidney disease, stage 3 (moderate): Secondary | ICD-10-CM | POA: Diagnosis not present

## 2015-03-26 DIAGNOSIS — R809 Proteinuria, unspecified: Secondary | ICD-10-CM | POA: Diagnosis not present

## 2015-03-26 DIAGNOSIS — E559 Vitamin D deficiency, unspecified: Secondary | ICD-10-CM | POA: Diagnosis not present

## 2015-03-26 DIAGNOSIS — I10 Essential (primary) hypertension: Secondary | ICD-10-CM | POA: Diagnosis not present

## 2015-03-26 NOTE — H&P (Signed)
  Date of examination:  03/23/15  Indication for surgery: Exotropia  Pertinent past medical history:  Past Medical History  Diagnosis Date  . CAD (coronary artery disease)     native vessel  . Hyperlipidemia   . HTN (hypertension)   . Carotid artery disease (HCC)     nonobstructive  . Anemia   . Hypokalemia   . Malnutrition ([place])     protein-calorie  . PTSD (post-traumatic stress disorder)   . Cecal diverticulitis 2008    drained  . Cellulitis, gluteal     bilateral for the past 6 months/notes 01/20/2013  . Myocardial infarction [facility])     "silent; before OHS" (01/20/2013)  . Agent orange exposure 1966 or 1971  . Exertional shortness of breath   . Diabetes mellitus type II   . Arthritis     "hands and back" (01/20/2013)  . Chronic lower back pain   . Anxiety   . Depression   . Chronic renal insufficiency   . Urinary incontinence   . Cataract   . Spina bifida ([place])     Pertinent ocular history:  Pt with history of macular degeneration and cataracts in both eyes now with decompensating intermittent exotropia; happy with 35pd [place] OD. Wishes for repair to improve binocular vision.  Pertinent family history:  Family History  Problem Relation Age of Onset  . Heart disease Mother   . Heart attack Mother   . Heart disease Father   . Heart attack Father   . Cancer Sister     breast  . Cancer Sister     lung and ovarian    General:  Needs walker to ambulate but mood and affect appropriate.  Eyes:    Acuity cc  [place]  OD 20/60  OS 20/40  External:  Dermatochalasis and lower lid entropion OU  Anterior segment: Blepharitis OU  Motility:   30pd  X(T)  Heart: Defer to cardiology/FP exam  Lungs: Defer to FP exam  Abdomen: Defer to FP exam  Impression: 82yo male with decompensating intermittent exotropia   Plan: strabismus repair  [name]

## 2015-03-27 ENCOUNTER — Encounter (HOSPITAL_BASED_OUTPATIENT_CLINIC_OR_DEPARTMENT_OTHER): Payer: Self-pay | Admitting: *Deleted

## 2015-03-28 DIAGNOSIS — M5137 Other intervertebral disc degeneration, lumbosacral region: Secondary | ICD-10-CM | POA: Diagnosis not present

## 2015-03-28 DIAGNOSIS — M9901 Segmental and somatic dysfunction of cervical region: Secondary | ICD-10-CM | POA: Diagnosis not present

## 2015-03-28 DIAGNOSIS — M9902 Segmental and somatic dysfunction of thoracic region: Secondary | ICD-10-CM | POA: Diagnosis not present

## 2015-03-28 DIAGNOSIS — M9903 Segmental and somatic dysfunction of lumbar region: Secondary | ICD-10-CM | POA: Diagnosis not present

## 2015-03-28 DIAGNOSIS — N183 Chronic kidney disease, stage 3 (moderate): Secondary | ICD-10-CM | POA: Diagnosis not present

## 2015-03-28 DIAGNOSIS — E872 Acidosis: Secondary | ICD-10-CM | POA: Diagnosis not present

## 2015-03-28 DIAGNOSIS — E875 Hyperkalemia: Secondary | ICD-10-CM | POA: Diagnosis not present

## 2015-03-29 ENCOUNTER — Ambulatory Visit (HOSPITAL_BASED_OUTPATIENT_CLINIC_OR_DEPARTMENT_OTHER): Payer: Medicare Other | Admitting: Anesthesiology

## 2015-03-29 ENCOUNTER — Encounter (HOSPITAL_BASED_OUTPATIENT_CLINIC_OR_DEPARTMENT_OTHER)
Admission: RE | Disposition: A | Discharge status: Home / Self Care | Payer: Self-pay | Source: Ambulatory Visit | Attending: Ophthalmology

## 2015-03-29 ENCOUNTER — Encounter (HOSPITAL_BASED_OUTPATIENT_CLINIC_OR_DEPARTMENT_OTHER): Payer: Self-pay

## 2015-03-29 ENCOUNTER — Ambulatory Visit (HOSPITAL_BASED_OUTPATIENT_CLINIC_OR_DEPARTMENT_OTHER)
Admission: RE | Admit: 2015-03-29 | Discharge: 2015-03-29 | Disposition: A | Discharge status: Home / Self Care | Payer: Medicare Other | Source: Ambulatory Visit | Attending: Ophthalmology | Admitting: Ophthalmology

## 2015-03-29 DIAGNOSIS — H5034 Intermittent alternating exotropia: Secondary | ICD-10-CM | POA: Diagnosis not present

## 2015-03-29 DIAGNOSIS — I739 Peripheral vascular disease, unspecified: Secondary | ICD-10-CM | POA: Diagnosis not present

## 2015-03-29 DIAGNOSIS — I129 Hypertensive chronic kidney disease with stage 1 through stage 4 chronic kidney disease, or unspecified chronic kidney disease: Secondary | ICD-10-CM | POA: Diagnosis not present

## 2015-03-29 DIAGNOSIS — I509 Heart failure, unspecified: Secondary | ICD-10-CM | POA: Insufficient documentation

## 2015-03-29 DIAGNOSIS — H5000 Unspecified esotropia: Secondary | ICD-10-CM | POA: Diagnosis not present

## 2015-03-29 DIAGNOSIS — M1990 Unspecified osteoarthritis, unspecified site: Secondary | ICD-10-CM | POA: Insufficient documentation

## 2015-03-29 DIAGNOSIS — Z951 Presence of aortocoronary bypass graft: Secondary | ICD-10-CM | POA: Insufficient documentation

## 2015-03-29 DIAGNOSIS — E119 Type 2 diabetes mellitus without complications: Secondary | ICD-10-CM | POA: Insufficient documentation

## 2015-03-29 DIAGNOSIS — F329 Major depressive disorder, single episode, unspecified: Secondary | ICD-10-CM | POA: Insufficient documentation

## 2015-03-29 DIAGNOSIS — H5010 Unspecified exotropia: Secondary | ICD-10-CM | POA: Insufficient documentation

## 2015-03-29 DIAGNOSIS — I2510 Atherosclerotic heart disease of native coronary artery without angina pectoris: Secondary | ICD-10-CM | POA: Insufficient documentation

## 2015-03-29 DIAGNOSIS — Z87891 Personal history of nicotine dependence: Secondary | ICD-10-CM | POA: Insufficient documentation

## 2015-03-29 DIAGNOSIS — I252 Old myocardial infarction: Secondary | ICD-10-CM | POA: Insufficient documentation

## 2015-03-29 DIAGNOSIS — F419 Anxiety disorder, unspecified: Secondary | ICD-10-CM | POA: Diagnosis not present

## 2015-03-29 DIAGNOSIS — N189 Chronic kidney disease, unspecified: Secondary | ICD-10-CM | POA: Diagnosis not present

## 2015-03-29 DIAGNOSIS — I10 Essential (primary) hypertension: Secondary | ICD-10-CM | POA: Diagnosis not present

## 2015-03-29 HISTORY — PX: STRABISMUS SURGERY: SHX218

## 2015-03-29 LAB — GLUCOSE, CAPILLARY
GLUCOSE-CAPILLARY: 136 mg/dL — AB (ref 65–99)
Glucose-Capillary: 149 mg/dL — ABNORMAL HIGH (ref 65–99)

## 2015-03-29 SURGERY — STRABISMUS SURGERY, BILATERAL
Anesthesia: General | Site: Eye | Laterality: Bilateral

## 2015-03-29 MED ORDER — ACETAMINOPHEN-CODEINE #3 300-30 MG PO TABS: 1.0000 | ORAL_TABLET | ORAL | Status: DC | PRN

## 2015-03-29 MED ORDER — ONDANSETRON HCL 4 MG/2ML IJ SOLN
INTRAMUSCULAR | Status: AC
  Filled 2015-03-29: qty 2

## 2015-03-29 MED ORDER — PHENYLEPHRINE HCL 2.5 % OP SOLN
1.0000 [drp] | Freq: Once | OPHTHALMIC | Status: AC
  Administered 2015-03-29: 1 [drp] via OPHTHALMIC
  Filled 2015-03-29: qty 2

## 2015-03-29 MED ORDER — ONDANSETRON HCL 4 MG/2ML IJ SOLN: 4.0000 mg | Freq: Once | INTRAMUSCULAR | Status: DC | PRN

## 2015-03-29 MED ORDER — SCOPOLAMINE 1 MG/3DAYS TD PT72: 1.0000 | MEDICATED_PATCH | Freq: Once | TRANSDERMAL | Status: DC | PRN

## 2015-03-29 MED ORDER — DEXAMETHASONE SODIUM PHOSPHATE 4 MG/ML IJ SOLN
INTRAMUSCULAR | Status: DC | PRN
  Administered 2015-03-29: 5 mg via INTRAVENOUS

## 2015-03-29 MED ORDER — BUPIVACAINE HCL (PF) 0.5 % IJ SOLN
INTRAMUSCULAR | Status: DC | PRN
  Administered 2015-03-29: 3 mL

## 2015-03-29 MED ORDER — GLYCOPYRROLATE 0.2 MG/ML IJ SOLN: 0.2000 mg | Freq: Once | INTRAMUSCULAR | Status: DC | PRN

## 2015-03-29 MED ORDER — TOBRAMYCIN-DEXAMETHASONE 0.3-0.1 % OP SUSP
OPHTHALMIC | Status: DC | PRN
  Administered 2015-03-29: 2 [drp] via OPHTHALMIC

## 2015-03-29 MED ORDER — FENTANYL CITRATE (PF) 100 MCG/2ML IJ SOLN
INTRAMUSCULAR | Status: AC
  Filled 2015-03-29: qty 2

## 2015-03-29 MED ORDER — PROPOFOL 10 MG/ML IV BOLUS
INTRAVENOUS | Status: DC | PRN
  Administered 2015-03-29: 150 mg via INTRAVENOUS
  Administered 2015-03-29: 50 mg via INTRAVENOUS

## 2015-03-29 MED ORDER — LACTATED RINGERS IV SOLN
INTRAVENOUS | Status: DC
  Administered 2015-03-29: 13:00:00 via INTRAVENOUS

## 2015-03-29 MED ORDER — TOBRAMYCIN-DEXAMETHASONE 0.3-0.1 % OP OINT: 1.0000 "application " | TOPICAL_OINTMENT | Freq: Three times a day (TID) | OPHTHALMIC | Status: DC

## 2015-03-29 MED ORDER — FENTANYL CITRATE (PF) 100 MCG/2ML IJ SOLN: 25.0000 ug | INTRAMUSCULAR | Status: DC | PRN

## 2015-03-29 MED ORDER — DEXAMETHASONE SODIUM PHOSPHATE 10 MG/ML IJ SOLN
INTRAMUSCULAR | Status: AC
  Filled 2015-03-29: qty 1

## 2015-03-29 MED ORDER — BSS IO SOLN
INTRAOCULAR | Status: DC | PRN
  Administered 2015-03-29: 15 mL

## 2015-03-29 MED ORDER — LIDOCAINE HCL (CARDIAC) 20 MG/ML IV SOLN
INTRAVENOUS | Status: DC | PRN
  Administered 2015-03-29: 40 mg via INTRAVENOUS

## 2015-03-29 MED ORDER — PROPOFOL 10 MG/ML IV BOLUS
INTRAVENOUS | Status: AC
  Filled 2015-03-29: qty 20

## 2015-03-29 MED ORDER — ONDANSETRON HCL 4 MG/2ML IJ SOLN
INTRAMUSCULAR | Status: DC | PRN
  Administered 2015-03-29: 4 mg via INTRAVENOUS

## 2015-03-29 MED ORDER — MIDAZOLAM HCL 2 MG/2ML IJ SOLN: 1.0000 mg | INTRAMUSCULAR | Status: DC | PRN

## 2015-03-29 MED ORDER — FENTANYL CITRATE (PF) 100 MCG/2ML IJ SOLN
50.0000 ug | INTRAMUSCULAR | Status: DC | PRN
  Administered 2015-03-29 (×2): 25 ug via INTRAVENOUS

## 2015-03-29 SURGICAL SUPPLY — 33 items
APL SRG 3 HI ABS STRL LF PLS (MISCELLANEOUS) ×1
APPLICATOR COTTON TIP 6IN STRL (MISCELLANEOUS) ×12 IMPLANT
APPLICATOR DR MATTHEWS STRL (MISCELLANEOUS) ×3 IMPLANT
BANDAGE EYE OVAL (MISCELLANEOUS) IMPLANT
CAUTERY EYE LOW TEMP 1300F FIN (OPHTHALMIC RELATED) IMPLANT
CLOSURE WOUND 1/4X4 (GAUZE/BANDAGES/DRESSINGS)
CORDS BIPOLAR (ELECTRODE) ×3 IMPLANT
COVER BACK TABLE 60X90IN (DRAPES) ×3 IMPLANT
COVER MAYO STAND STRL (DRAPES) ×3 IMPLANT
DRAPE EENT ADH APERT 15X15 STR (DRAPES) ×3 IMPLANT
DRAPE SURG 17X23 STRL (DRAPES) IMPLANT
DRAPE U-SHAPE 76X120 STRL (DRAPES) ×3 IMPLANT
GLOVE BIO SURGEON STRL SZ 6.5 (GLOVE) ×1 IMPLANT
GLOVE BIO SURGEON STRL SZ7 (GLOVE) ×3 IMPLANT
GLOVE BIO SURGEONS STRL SZ 6.5 (GLOVE) ×1
GLOVE BIOGEL M STRL SZ7.5 (GLOVE) IMPLANT
GOWN STRL REUS W/ TWL LRG LVL3 (GOWN DISPOSABLE) ×2 IMPLANT
GOWN STRL REUS W/TWL LRG LVL3 (GOWN DISPOSABLE) ×6
NS IRRIG 1000ML POUR BTL (IV SOLUTION) ×3 IMPLANT
PACK BASIN DAY SURGERY FS (CUSTOM PROCEDURE TRAY) ×3 IMPLANT
SHEILD EYE MED CORNL SHD 22X21 (OPHTHALMIC RELATED)
SHIELD EYE MED CORNL SHD 22X21 (OPHTHALMIC RELATED) IMPLANT
SLEEVE SCD COMPRESS KNEE MED (MISCELLANEOUS) ×2 IMPLANT
SPEAR EYE SURG WECK-CEL (MISCELLANEOUS) ×6 IMPLANT
STRIP CLOSURE SKIN 1/4X4 (GAUZE/BANDAGES/DRESSINGS) IMPLANT
SUT CHROMIC 7 0 TG140 8 (SUTURE) ×3 IMPLANT
SUT VICRYL 6 0 S 28 (SUTURE) ×4 IMPLANT
SUT VICRYL ABS 6-0 S29 18IN (SUTURE) IMPLANT
SYR 3ML 23GX1 SAFETY (SYRINGE) ×3 IMPLANT
SYRINGE 10CC LL (SYRINGE) ×3 IMPLANT
TOWEL OR 17X24 6PK STRL BLUE (TOWEL DISPOSABLE) ×3 IMPLANT
TOWEL OR NON WOVEN STRL DISP B (DISPOSABLE) ×3 IMPLANT
TRAY DSU PREP LF (CUSTOM PROCEDURE TRAY) ×3 IMPLANT

## 2015-03-29 NOTE — H&P (Signed)
.  Interval History and Physical Examination:  [place] Sphar.  03/29/2015  Date of Initial H&P: 03/26/15   The patient has been reexamined and the H&P has been reviewed. The patient has no new complaints. The indications for today's procedure remain valid.  There is no change in the plan of care. There are no medical contraindications for proceeding with today's surgery and we will go forward as planned.  Mele Sylvester, MARTHAMD

## 2015-03-29 NOTE — Transfer of Care (Signed)
Immediate Anesthesia Transfer of Care Note  Patient: [name].  Procedure(s) Performed: Procedure(s): REPAIR STRABISMUS BILATERAL (Bilateral)  Patient Location: PACU  Anesthesia Type:General  Level of Consciousness: sedated  Airway & Oxygen Therapy: Patient Spontanous Breathing and Patient connected to face mask oxygen  Post-op Assessment: Report given to RN and Post -op Vital signs reviewed and stable  Post vital signs: Reviewed and stable  Last Vitals:  Filed Vitals:   03/29/15 1249 03/29/15 1409  BP: 158/67 133/71  Pulse: 63   Temp: 36.5 C   Resp: 18     Complications: No apparent anesthesia complications

## 2015-03-29 NOTE — Anesthesia Preprocedure Evaluation (Addendum)
Anesthesia Evaluation  Patient identified by MRN, date of birth, ID band Patient awake    Reviewed: Allergy & Precautions, NPO status , Patient's Chart, lab work & pertinent test results, reviewed documented beta blocker date and time   Airway Mallampati: II  TM Distance: >3 FB Neck ROM: Limited    Dental  (+) Upper Dentures   Pulmonary former smoker,    breath sounds clear to auscultation       Cardiovascular hypertension, Pt. on medications and Pt. on home beta blockers + CAD, + Past MI, + CABG, + Peripheral Vascular Disease and +CHF   Rhythm:Regular Rate:Normal     Neuro/Psych Anxiety Depression Spina bifida with BLE weakness    GI/Hepatic negative GI ROS, Neg liver ROS,   Endo/Other  diabetes  Renal/GU Renal disease     Musculoskeletal  (+) Arthritis ,   Abdominal   Peds  Hematology  (+) anemia ,   Anesthesia Other Findings   Reproductive/Obstetrics                            Lab Results  Component Value Date   WBC 16.8* 03/14/2015   HGB 13.7* 06/19/2014   HCT 46.0 03/14/2015   MCV 93 03/14/2015   PLT 222 03/14/2015   Lab Results  Component Value Date   CREATININE 1.69* 03/14/2015   BUN 26 03/14/2015   NA 140 03/14/2015   K 4.5 03/14/2015   CL 95* 03/14/2015   CO2 22 03/14/2015    Anesthesia Physical Anesthesia Plan  ASA: III  Anesthesia Plan: General   Post-op Pain Management:    Induction: Intravenous  Airway Management Planned: LMA  Additional Equipment:   Intra-op Plan:   Post-operative Plan: Extubation in OR  Informed Consent: I have reviewed the patients History and Physical, chart, labs and discussed the procedure including the risks, benefits and alternatives for the proposed anesthesia with the patient or authorized representative who has indicated his/her understanding and acceptance.   Dental advisory given  Plan Discussed with:  CRNA  Anesthesia Plan Comments:         Anesthesia Quick Evaluation

## 2015-03-29 NOTE — Anesthesia Procedure Notes (Signed)
Procedure Name: LMA Insertion Date/Time: 03/29/2015 1:15 PM Performed by: Maryella Shivers Pre-anesthesia Checklist: Patient identified, Emergency Drugs available, Suction available and Patient being monitored Patient Re-evaluated:Patient Re-evaluated prior to inductionOxygen Delivery Method: Circle System Utilized Preoxygenation: Pre-oxygenation with 100% oxygen Intubation Type: IV induction Ventilation: Mask ventilation without difficulty LMA: LMA inserted LMA Size: 5.0 Number of attempts: 1 Airway Equipment and Method: bite block Placement Confirmation: positive ETCO2 Tube secured with: Tape Dental Injury: Teeth and Oropharynx as per pre-operative assessment

## 2015-03-29 NOTE — Op Note (Signed)
03/29/2015  2:14 PM  PATIENT:  [name].  82 y.o. male  PRE-OPERATIVE DIAGNOSIS:  Exotropia      POST-OPERATIVE DIAGNOSIS:  Exotropia     PROCEDURE:  Lateral rectus muscle recession 7 both  SURGEON:  Annita Brod, M.D.   ANESTHESIA:   local and general  COMPLICATIONS:None  DESCRIPTION OF PROCEDURE: The patient was taken to the operating room where He was identified by me. General anesthesia was induced without difficulty after placement of appropriate monitors. The patient was prepped and draped in the usual sterile ophthalmic fashion.  A lid speculum was placed in the left eye. Forced ductions were unremarkable. Through an inferotemporal fornix incision through conjunctiva and Tenon's fascia, the left lateral rectus muscle was engaged on a series of muscle hooks and cleared of its fascial attachments. The tendon was secured with a double-armed 6-0 Vicryl suture with a double locking bite at each border of the muscle, 1 mm from the insertion. The muscle was disinserted, and was reattached to sclera at a measured distance of 7 millimeters posterior to the original insertion, using direct scleral passes in crossed swords fashion.  The suture ends were tied securely after the position of the muscle had been checked and found to be accurate. 1.23mL of bupivacaine 0.5% was diffused into the sub-Tenons space for perioperative anesthesia. Conjunctiva was closed with 2 7-0 Chromic sutures.  The speculum was transferred to the right eye, where an identical procedure was performed, again effecting a 7 millimeters recession of the lateral rectus muscle.  Maxitrol ointment was placed in each eye. The patient was awakened without difficulty and taken to the recovery room in stable condition, having suffered no intraoperative or immediate postoperative complications.  Annita Brod M.D.   PATIENT DISPOSITION:  PACU - hemodynamically stable.

## 2015-03-29 NOTE — Discharge Instructions (Signed)
Diet: Clear liquids, advance to soft foods then regular diet as tolerated.  Pain control:   1)  Codeine/Acetaminophen 325 one or two by mouth every 4-6 hours as needed  **Do not take additional acetaminophen (Tylenol) when taking the prescribed medication  Eye medications:  Tobradex or Zylet eye drops or ointment, one drop or application in the operated eye(s) 3 times a day for 10 days.    Activity: No swimming for 1 week. It is OK to let water run over the face and eyes while showering or taking a bath, even during the first week.  No other restriction on exercise or activity.  Eye movement: The eyes may look very slightly crossed in or turned out. This is not unusual postoperatively and may happen up to two months after surgery while the muscles are healing. The eyes may be tired during the first few weeks after surgery; reading can be uncomfortable during the healing process but will not hurt the eyes.  Call Dr. Serita Grit office [phone] with any problems or concerns.     Post Anesthesia Home Care Instructions  Activity: Get plenty of rest for the remainder of the day. A responsible adult should stay with you for 24 hours following the procedure.  For the next 24 hours, DO NOT: -Drive a car -Paediatric nurse -Drink alcoholic beverages -Take any medication unless instructed by your physician -Make any legal decisions or sign important papers.  Meals: Start with liquid foods such as gelatin or soup. Progress to regular foods as tolerated. Avoid greasy, spicy, heavy foods. If nausea and/or vomiting occur, drink only clear liquids until the nausea and/or vomiting subsides. Call your physician if vomiting continues.  Special Instructions/Symptoms: Your throat may feel dry or sore from the anesthesia or the breathing tube placed in your throat during surgery. If this causes discomfort, gargle with warm salt water. The discomfort should disappear within 24 hours.  If you had a  scopolamine patch placed behind your ear for the management of post- operative nausea and/or vomiting:  1. The medication in the patch is effective for 72 hours, after which it should be removed.  Wrap patch in a tissue and discard in the trash. Wash hands thoroughly with soap and water. 2. You may remove the patch earlier than 72 hours if you experience unpleasant side effects which may include dry mouth, dizziness or visual disturbances. 3. Avoid touching the patch. Wash your hands with soap and water after contact with the patch.

## 2015-03-29 NOTE — Anesthesia Postprocedure Evaluation (Signed)
Anesthesia Post Note  Patient: [name].  Procedure(s) Performed: Procedure(s) (LRB): REPAIR STRABISMUS BILATERAL (Bilateral)  Patient location during evaluation: PACU Anesthesia Type: General Level of consciousness: awake and alert Pain management: pain level controlled Vital Signs Assessment: post-procedure vital signs reviewed and stable Respiratory status: spontaneous breathing Cardiovascular status: blood pressure returned to baseline Anesthetic complications: no    Last Vitals:  Filed Vitals:   03/29/15 1500 03/29/15 1515  BP: 133/62 139/65  Pulse: 68 67  Temp:    Resp: 23 18    Last Pain:  Filed Vitals:   03/29/15 1523  PainSc: 0-No pain                 Tiajuana Amass

## 2015-04-01 ENCOUNTER — Encounter (HOSPITAL_BASED_OUTPATIENT_CLINIC_OR_DEPARTMENT_OTHER): Payer: Self-pay | Admitting: Ophthalmology

## 2015-04-07 ENCOUNTER — Other Ambulatory Visit: Payer: Self-pay | Admitting: Cardiology

## 2015-04-12 DIAGNOSIS — M9903 Segmental and somatic dysfunction of lumbar region: Secondary | ICD-10-CM | POA: Diagnosis not present

## 2015-04-12 DIAGNOSIS — M5137 Other intervertebral disc degeneration, lumbosacral region: Secondary | ICD-10-CM | POA: Diagnosis not present

## 2015-04-12 DIAGNOSIS — M9901 Segmental and somatic dysfunction of cervical region: Secondary | ICD-10-CM | POA: Diagnosis not present

## 2015-04-12 DIAGNOSIS — M9902 Segmental and somatic dysfunction of thoracic region: Secondary | ICD-10-CM | POA: Diagnosis not present

## 2015-04-18 ENCOUNTER — Other Ambulatory Visit: Payer: Self-pay | Admitting: Family Medicine

## 2015-04-18 ENCOUNTER — Telehealth: Payer: Self-pay | Admitting: Family Medicine

## 2015-04-23 ENCOUNTER — Other Ambulatory Visit: Payer: Self-pay | Admitting: Family Medicine

## 2015-04-26 ENCOUNTER — Telehealth: Payer: Self-pay | Admitting: Pharmacist

## 2015-04-26 DIAGNOSIS — H353221 Exudative age-related macular degeneration, left eye, with active choroidal neovascularization: Secondary | ICD-10-CM | POA: Diagnosis not present

## 2015-04-26 NOTE — Telephone Encounter (Signed)
I have not called this patient.  I tried to call to see if I could clarify but had to [place].

## 2015-04-27 ENCOUNTER — Other Ambulatory Visit: Payer: Self-pay | Admitting: Cardiology

## 2015-04-27 ENCOUNTER — Other Ambulatory Visit: Payer: Self-pay | Admitting: Family Medicine

## 2015-05-02 DIAGNOSIS — M5137 Other intervertebral disc degeneration, lumbosacral region: Secondary | ICD-10-CM | POA: Diagnosis not present

## 2015-05-02 DIAGNOSIS — M9902 Segmental and somatic dysfunction of thoracic region: Secondary | ICD-10-CM | POA: Diagnosis not present

## 2015-05-02 DIAGNOSIS — M9901 Segmental and somatic dysfunction of cervical region: Secondary | ICD-10-CM | POA: Diagnosis not present

## 2015-05-02 DIAGNOSIS — M9903 Segmental and somatic dysfunction of lumbar region: Secondary | ICD-10-CM | POA: Diagnosis not present

## 2015-05-03 NOTE — Telephone Encounter (Signed)
Gave pt some pads to get through weekend, he will call company

## 2015-05-08 ENCOUNTER — Telehealth: Payer: Self-pay | Admitting: Family Medicine

## 2015-05-08 MED ORDER — BUPROPION HCL ER (XL) 300 MG PO TB24: 300.0000 mg | ORAL_TABLET | Freq: Every day | ORAL | Status: DC

## 2015-05-08 NOTE — Telephone Encounter (Signed)
Shoes and form for wound bandages

## 2015-05-08 NOTE — Telephone Encounter (Addendum)
Refill sent  -- appt with [facility] moved up Will fill out forms for shoes and

## 2015-05-10 DIAGNOSIS — M9901 Segmental and somatic dysfunction of cervical region: Secondary | ICD-10-CM | POA: Diagnosis not present

## 2015-05-10 DIAGNOSIS — M5137 Other intervertebral disc degeneration, lumbosacral region: Secondary | ICD-10-CM | POA: Diagnosis not present

## 2015-05-10 DIAGNOSIS — M9902 Segmental and somatic dysfunction of thoracic region: Secondary | ICD-10-CM | POA: Diagnosis not present

## 2015-05-10 DIAGNOSIS — M9903 Segmental and somatic dysfunction of lumbar region: Secondary | ICD-10-CM | POA: Diagnosis not present

## 2015-05-14 DIAGNOSIS — M5137 Other intervertebral disc degeneration, lumbosacral region: Secondary | ICD-10-CM | POA: Diagnosis not present

## 2015-05-14 DIAGNOSIS — M9901 Segmental and somatic dysfunction of cervical region: Secondary | ICD-10-CM | POA: Diagnosis not present

## 2015-05-14 DIAGNOSIS — M9903 Segmental and somatic dysfunction of lumbar region: Secondary | ICD-10-CM | POA: Diagnosis not present

## 2015-05-14 DIAGNOSIS — M9902 Segmental and somatic dysfunction of thoracic region: Secondary | ICD-10-CM | POA: Diagnosis not present

## 2015-05-15 DIAGNOSIS — B351 Tinea unguium: Secondary | ICD-10-CM | POA: Diagnosis not present

## 2015-05-15 DIAGNOSIS — E1151 Type 2 diabetes mellitus with diabetic peripheral angiopathy without gangrene: Secondary | ICD-10-CM | POA: Diagnosis not present

## 2015-05-15 DIAGNOSIS — L84 Corns and callosities: Secondary | ICD-10-CM | POA: Diagnosis not present

## 2015-05-25 ENCOUNTER — Ambulatory Visit (INDEPENDENT_AMBULATORY_CARE_PROVIDER_SITE_OTHER): Payer: Medicare Other | Admitting: Cardiology

## 2015-05-25 ENCOUNTER — Encounter: Payer: Self-pay | Admitting: Cardiology

## 2015-05-25 VITALS — BP 142/74 | HR 79 | Ht 68.0 in | Wt 219.4 lb

## 2015-05-25 DIAGNOSIS — I2510 Atherosclerotic heart disease of native coronary artery without angina pectoris: Secondary | ICD-10-CM

## 2015-05-25 DIAGNOSIS — I10 Essential (primary) hypertension: Secondary | ICD-10-CM

## 2015-05-25 DIAGNOSIS — N183 Chronic kidney disease, stage 3 unspecified: Secondary | ICD-10-CM

## 2015-05-25 DIAGNOSIS — I6523 Occlusion and stenosis of bilateral carotid arteries: Secondary | ICD-10-CM

## 2015-05-25 NOTE — Patient Instructions (Signed)
Medication Instructions:  Your physician recommends that you continue on your current medications as directed. Please refer to the Current Medication list given to you today.   Labwork: -None  Testing/Procedures: -None  Follow-Up: Your physician wants you to follow-up in: 9 months with Dr. Aundra Dubin.  You will receive a reminder letter in the mail two months in advance. If you don't receive a letter, please call our office to schedule the follow-up appointment.   Any Other Special Instructions Will Be Listed Below (If Applicable).     If you need a refill on your cardiac medications before your next appointment, please call your pharmacy.

## 2015-05-27 NOTE — Progress Notes (Signed)
Patient ID: Bobby Bridewell., male   DOB: 1930/05/15, 82 y.o.   MRN: [id number] PCP: Dr. Laurance Flatten  82 yo with history of CAD s/p CABG and chronic diastolic CHF presents for cardiology followup.  His main problem in the last couple of years has been gluteal pressure ulcers.  These became infected and he has had extensive treatment with antibiotics and wound care.  He was very inactive during this period. They have finally healed. He is somewhat unsteady walking and uses a walker.  No chest pain. Last echo was done in 1/15 and showed EF 50-55% with normal RV size and mildly decreased RV systolic function.  No exertional dyspnea but not particularly active.  No chest pain.  Weight is down 24 lbs with diet.      Labs (9/14): LDL particle number 1377, LDL 74 Labs (11/14): K 4.7,creatinine 1.26 Labs (5/15): K 5.1, creatinine 1.79, LDL 66, LDL-P 1001 Labs (8/15): K 4.6, creatinine 1.48, LDL 75, HDL 52, BNP 45 Labs (12/15): K 5.1, creatinine 1.8, LDL-P 1397, LDL 77 Labs (1/17): creatinine 1.69, LDL-P 1063, LDL 77  ECG: NSR, left axis deviation, old anterior MI.   PMH: 1. Mild spina bifida 2. Type II diabetes 3. Hyperlipidemia 4. HTN 5. CKD 6. Anemia 7. PTSD 8. CAD: CABG 7/98 (Dr. Redmond Pulling).  Last Myoview in 2/07 showed EF 63%, no ischemia.  9. Gluteal decubitus ulcers 10. Obesity 11. Carotid stenosis: Carotid dopplers (5/13) with 40-59% bilateral ICA stenosis.  Carotid dopplers (1/15) with 40-59% bilateral ICA stenosis. Carotid dopplers (1/17) with mild stenosis only.  12. Diastolic CHF: Echo (0000000) with EF 50-55%, normal RV size with mildly decreased systolic function.   SH: Married, lives in [place], retired from [employer], prior smoker.   FH: CAD  ROS: All systems reviewed and negative except as per HPI.   Current Outpatient Prescriptions  Medication Sig Dispense Refill  . aspirin 81 MG tablet Take 81 mg by mouth daily.      Marland Kitchen buPROPion (WELLBUTRIN XL) 300 MG 24 hr tablet  Take 1 tablet (300 mg total) by mouth daily. 90 tablet 1  . Cholecalciferol (VITAMIN D-3) 5000 UNITS TABS Take 1 tablet by mouth daily.      . Coenzyme Q10 (CO Q-10) 100 MG CAPS Take 1 capsule by mouth 2 (two) times daily.     . collagenase (SANTYL) ointment Apply 1 application topically daily. 90 g 3  . furosemide (LASIX) 20 MG tablet TAKE 1 TABLET EVERY MORNING (NEED FOLLOW UP WITH DOCTOR PLEASE CALL AND SCHEDULE AN APPOINTMENT FOR DECEMBER) 90 tablet 0  . glipiZIDE (GLUCOTROL XL) 10 MG 24 hr tablet TAKE 1 TABLET DAILY 90 tablet 1  . JANUMET 50-500 MG tablet TAKE 1 TABLET DAILY WITH FOOD 90 tablet 1  . metoprolol tartrate (LOPRESSOR) 25 MG tablet Take 1 tablet (25 mg total) by mouth 2 (two) times daily. 180 tablet 0  . Multiple Vitamins-Minerals (PRESERVISION AREDS 2 PO) Take 1 capsule by mouth 2 (two) times daily.    . nitroGLYCERIN (NITROSTAT) 0.4 MG SL tablet Place 1 tablet (0.4 mg total) under the tongue every 5 (five) minutes as needed. 25 tablet 6  . NON FORMULARY cran-actin 100mg  1 tablet daily    . NON FORMULARY Sweetish Bitters Daily    . Omega-3 Fatty Acids (FISH OIL) 1000 MG CAPS Take 1 capsule by mouth 2 (two) times daily.      . polyethylene glycol powder (GLYCOLAX/MIRALAX) powder Take 17 g by  mouth 2 (two) times daily as needed. 850 g 3  . sodium bicarbonate 650 MG tablet Take 650 mg by mouth 3 (three) times daily.  4  . VYTORIN 10-20 MG tablet TAKE 1 TABLET DAILY AS DIRECTED 90 tablet 1   No current facility-administered medications for this visit.    BP 142/74 mmHg  Pulse 79  Ht 5\' 8"  (1.727 m)  Wt 219 lb 6.4 oz (99.519 kg)  BMI 33.37 kg/m2 General: NAD Neck: JVP 7 cm, no thyromegaly or thyroid nodule.  Lungs: CTAB CV: Nondisplaced PMI.  Heart regular S1/S2, no S3/S4, no murmur.  No peripheral edema.  No carotid bruit.  Normal pedal pulses.  Abdomen: Soft, nontender, no hepatosplenomegaly, no distention.  Neurologic: Alert and oriented x 3.  Psych: Normal  affect. Extremities: No clubbing or cyanosis.   Assessment/Plan: 1. CAD: s/p CABG in 1998.  He has not had cardiac cath since that time.  Last Myoview was in 2007 and was nonischemic. No chest pain.   - Continue ASA 81, statin, metoprolol.  - Bypass grafts are now 80 years old.  I asked him to let me know if he develops any significant dyspnea or chest pain.  Would then arrange for a stress test.  2. Carotid stenosis: Stable, repeat dopplers 1/18.    3. Hyperlipidemia: Good lipids in 1/17.  Continue current statin, encouraged heart healthy diet. 4. HTN: BP stable.  5. Chronic diastolic CHF: EF 99991111 on echo. NYHA class II symptoms now.  Continue Lasix 20 mg daily.  6. CKD: Avoid NSAIDs.  Sees Dr Lowanda Foster.   Followup in 9 months.   Loralie Champagne 05/27/2015

## 2015-05-29 DIAGNOSIS — H353221 Exudative age-related macular degeneration, left eye, with active choroidal neovascularization: Secondary | ICD-10-CM | POA: Diagnosis not present

## 2015-05-31 ENCOUNTER — Telehealth: Payer: Self-pay | Admitting: Family Medicine

## 2015-05-31 ENCOUNTER — Other Ambulatory Visit: Payer: Self-pay | Admitting: *Deleted

## 2015-05-31 ENCOUNTER — Other Ambulatory Visit: Payer: Medicare Other

## 2015-05-31 DIAGNOSIS — R399 Unspecified symptoms and signs involving the genitourinary system: Secondary | ICD-10-CM

## 2015-05-31 LAB — URINALYSIS, COMPLETE
Bilirubin, UA: NEGATIVE
GLUCOSE, UA: NEGATIVE
Ketones, UA: NEGATIVE
NITRITE UA: NEGATIVE
Protein, UA: NEGATIVE
RBC, UA: NEGATIVE
SPEC GRAV UA: 1.015 (ref 1.005–1.030)
Urobilinogen, Ur: 1 mg/dL (ref 0.2–1.0)
pH, UA: 7 (ref 5.0–7.5)

## 2015-05-31 LAB — MICROSCOPIC EXAMINATION
RBC MICROSCOPIC, UA: NONE SEEN /HPF (ref 0–?)
WBC, UA: >30 /hpf — AB (ref 0–?)

## 2015-05-31 MED ORDER — DOXYCYCLINE HYCLATE 100 MG PO TABS: 100.0000 mg | ORAL_TABLET | Freq: Two times a day (BID) | ORAL | Status: DC

## 2015-05-31 NOTE — Telephone Encounter (Signed)
Please follow through with recommendations

## 2015-05-31 NOTE — Telephone Encounter (Signed)
Patient aware that Rx has been sent to the pharmacy and to come back after completing antibiotic to leave another urine samples

## 2015-05-31 NOTE — Telephone Encounter (Signed)
Ideally, it would be good to check a urine microscopic and get a culture and sensitivity. If he is unable to do this start doxycycline 100 mg twice daily with food for 10 days and recheck a urine when that is completed

## 2015-06-03 LAB — URINE CULTURE

## 2015-06-13 ENCOUNTER — Ambulatory Visit: Payer: Medicare Other | Admitting: Family Medicine

## 2015-06-15 ENCOUNTER — Encounter: Payer: Self-pay | Admitting: Family Medicine

## 2015-06-15 ENCOUNTER — Ambulatory Visit (INDEPENDENT_AMBULATORY_CARE_PROVIDER_SITE_OTHER): Payer: Medicare Other | Admitting: Family Medicine

## 2015-06-15 VITALS — BP 131/76 | HR 75 | Temp 98.2°F | Ht 68.0 in | Wt 210.0 lb

## 2015-06-15 DIAGNOSIS — Z87440 Personal history of urinary (tract) infections: Secondary | ICD-10-CM

## 2015-06-15 DIAGNOSIS — I10 Essential (primary) hypertension: Secondary | ICD-10-CM

## 2015-06-15 DIAGNOSIS — E785 Hyperlipidemia, unspecified: Secondary | ICD-10-CM | POA: Diagnosis not present

## 2015-06-15 DIAGNOSIS — I5032 Chronic diastolic (congestive) heart failure: Secondary | ICD-10-CM | POA: Diagnosis not present

## 2015-06-15 DIAGNOSIS — R300 Dysuria: Secondary | ICD-10-CM

## 2015-06-15 DIAGNOSIS — Z1211 Encounter for screening for malignant neoplasm of colon: Secondary | ICD-10-CM

## 2015-06-15 DIAGNOSIS — D72829 Elevated white blood cell count, unspecified: Secondary | ICD-10-CM

## 2015-06-15 DIAGNOSIS — L89300 Pressure ulcer of unspecified buttock, unstageable: Secondary | ICD-10-CM | POA: Diagnosis not present

## 2015-06-15 DIAGNOSIS — Q669 Congenital deformity of feet, unspecified, unspecified foot: Secondary | ICD-10-CM

## 2015-06-15 DIAGNOSIS — E1121 Type 2 diabetes mellitus with diabetic nephropathy: Secondary | ICD-10-CM | POA: Diagnosis not present

## 2015-06-15 DIAGNOSIS — I2510 Atherosclerotic heart disease of native coronary artery without angina pectoris: Secondary | ICD-10-CM | POA: Diagnosis not present

## 2015-06-15 DIAGNOSIS — L89159 Pressure ulcer of sacral region, unspecified stage: Secondary | ICD-10-CM | POA: Diagnosis not present

## 2015-06-15 DIAGNOSIS — E559 Vitamin D deficiency, unspecified: Secondary | ICD-10-CM

## 2015-06-15 DIAGNOSIS — I6523 Occlusion and stenosis of bilateral carotid arteries: Secondary | ICD-10-CM | POA: Diagnosis not present

## 2015-06-15 DIAGNOSIS — N390 Urinary tract infection, site not specified: Secondary | ICD-10-CM | POA: Diagnosis not present

## 2015-06-15 DIAGNOSIS — N183 Chronic kidney disease, stage 3 unspecified: Secondary | ICD-10-CM

## 2015-06-15 DIAGNOSIS — E1169 Type 2 diabetes mellitus with other specified complication: Secondary | ICD-10-CM

## 2015-06-15 DIAGNOSIS — N3942 Incontinence without sensory awareness: Secondary | ICD-10-CM | POA: Diagnosis not present

## 2015-06-15 DIAGNOSIS — A498 Other bacterial infections of unspecified site: Secondary | ICD-10-CM

## 2015-06-15 DIAGNOSIS — Q057 Lumbar spina bifida without hydrocephalus: Secondary | ICD-10-CM

## 2015-06-15 LAB — URINALYSIS, COMPLETE
BILIRUBIN UA: NEGATIVE
GLUCOSE, UA: NEGATIVE
KETONES UA: NEGATIVE
Nitrite, UA: NEGATIVE
PROTEIN UA: NEGATIVE
RBC, UA: NEGATIVE
SPEC GRAV UA: 1.015 (ref 1.005–1.030)
UUROB: 0.2 mg/dL (ref 0.2–1.0)
pH, UA: 6 (ref 5.0–7.5)

## 2015-06-15 LAB — MICROSCOPIC EXAMINATION
BACTERIA UA: NONE SEEN
RBC, UA: NONE SEEN /hpf (ref 0–?)

## 2015-06-15 LAB — BAYER DCA HB A1C WAIVED: HB A1C: 6.9 % (ref <7.0)

## 2015-06-15 NOTE — Progress Notes (Signed)
Subjective:    Patient ID: [name]., male    DOB: [birth year], 82 y.o.   MRN: [id number]  HPI Pt here for follow up and management of chronic medical problems which includes hyperlipidemia, hypertension and diabetes. He is taking medications regularly.The patient is here for completion of forms for his diabetic shoes his driver's license review and documentation of his buttock decubitus. Overall he is doing well. We will try to get a picture of this sacral wound. The patient has recently seen the cardiologist and plans to see him again in about 9 months. He denies any chest pain or shortness of breath. He's having no problems with swallowing heartburn indigestion nausea vomiting diarrhea or blood in the stool. He does have constipation and this he relieves by taking when necessary MiraLAX. He is chronically incontinent due to his spina bifida. He wears pull-ups for this. He is seeing the ophthalmologist regularly and actually sees 3 different eye specialist. His general eye doctor 1 for his macular degeneration and another specialist because of muscle problems with the eye. His sacral decubiti seem to wax and wane. His wife changes the dressings on these daily. Currently the one on the right side is bigger.      Patient Active Problem List   Diagnosis Date Noted  . Decubitus ulcer, buttock 10/31/2014  . Chronic diastolic CHF (congestive heart failure) ([place]) 09/22/2013  . Constipation 07/05/2013  . Cellulitis 01/19/2013  . Abscess or cellulitis of gluteal region 01/19/2013  . UTI (lower urinary tract infection) 01/19/2013  . Urinary incontinence 12/13/2012  . External hemorrhoids with complication 74/94/4967  . Chronic constipation 11/29/2012  . Metabolic syndrome 59/16/3846  . Hemorrhoid prolapse 11/29/2012  . Spina bifida ([place]) 11/18/2012  . CAD, NATIVE VESSEL 03/08/2009  . Type 2 diabetes mellitus with diabetic nephropathy ([place]) 10/29/2008  . MALNUTRITION, PROTEIN-CALORIE  10/29/2008  . HYPERLIPIDEMIA 10/29/2008  . HYPOKALEMIA 10/29/2008  . ANEMIA 10/29/2008  . PTSD 10/29/2008  . Essential hypertension 10/29/2008  . Chronic kidney disease 10/29/2008   Outpatient Encounter Prescriptions as of 06/15/2015  Medication Sig  . aspirin 81 MG tablet Take 81 mg by mouth daily.    Marland Kitchen buPROPion (WELLBUTRIN XL) 300 MG 24 hr tablet Take 1 tablet (300 mg total) by mouth daily.  . Cholecalciferol (VITAMIN D-3) 5000 UNITS TABS Take 1 tablet by mouth daily.    . Coenzyme Q10 (CO Q-10) 100 MG CAPS Take 1 capsule by mouth 2 (two) times daily.   . collagenase (SANTYL) ointment Apply 1 application topically daily.  . furosemide (LASIX) 20 MG tablet TAKE 1 TABLET EVERY MORNING (NEED FOLLOW UP WITH DOCTOR PLEASE CALL AND SCHEDULE AN APPOINTMENT FOR DECEMBER)  . glipiZIDE (GLUCOTROL XL) 10 MG 24 hr tablet TAKE 1 TABLET DAILY  . JANUMET 50-500 MG tablet TAKE 1 TABLET DAILY WITH FOOD  . metoprolol tartrate (LOPRESSOR) 25 MG tablet Take 1 tablet (25 mg total) by mouth 2 (two) times daily.  . Multiple Vitamins-Minerals (PRESERVISION AREDS 2 PO) Take 1 capsule by mouth 2 (two) times daily.  . nitroGLYCERIN (NITROSTAT) 0.4 MG SL tablet Place 1 tablet (0.4 mg total) under the tongue every 5 (five) minutes as needed.  . NON FORMULARY cran-actin 144m 1 tablet daily  . NON FORMULARY Sweetish Bitters Daily  . Omega-3 Fatty Acids (FISH OIL) 1000 MG CAPS Take 1 capsule by mouth 2 (two) times daily.    . polyethylene glycol powder (GLYCOLAX/MIRALAX) powder Take 17 g by mouth 2 (two) times  daily as needed.  . sodium bicarbonate 650 MG tablet Take 650 mg by mouth 3 (three) times daily.  Marland Kitchen VYTORIN 10-20 MG tablet TAKE 1 TABLET DAILY AS DIRECTED  . [DISCONTINUED] doxycycline (VIBRA-TABS) 100 MG tablet Take 1 tablet (100 mg total) by mouth 2 (two) times daily. For 10 days   No facility-administered encounter medications on file as of 06/15/2015.     Review of Systems  Constitutional: Negative.     HENT: Negative.   Eyes: Negative.   Respiratory: Negative.   Cardiovascular: Negative.   Gastrointestinal: Negative.   Endocrine: Negative.   Genitourinary: Negative.   Musculoskeletal: Negative.   Skin: Positive for wound (sacral).  Allergic/Immunologic: Negative.   Neurological: Negative.   Hematological: Negative.   Psychiatric/Behavioral: Negative.        Objective:   Physical Exam  Constitutional: He is oriented to person, place, and time. He appears well-developed and well-nourished. No distress.  The patient comes to the visit today with his rolling walker. He is alert and pleasant  HENT:  Head: Normocephalic and atraumatic.  Right Ear: External ear normal.  Left Ear: External ear normal.  Nose: Nose normal.  Mouth/Throat: Oropharynx is clear and moist. No oropharyngeal exudate.  Eyes: Conjunctivae and EOM are normal. Pupils are equal, round, and reactive to light. Right eye exhibits no discharge. Left eye exhibits no discharge. No scleral icterus.  His eyes appeared good and there is no drainage.  Neck: Normal range of motion. Neck supple. No thyromegaly present.  Cardiovascular: Normal rate, regular rhythm and normal heart sounds.   No murmur heard. Distal pulses were difficult to palpate bilaterally. The heart is regular and at 72/m  Pulmonary/Chest: Effort normal and breath sounds normal. No respiratory distress. He has no wheezes. He has no rales. He exhibits no tenderness.  Clear anteriorly and posteriorly  Abdominal: Soft. Bowel sounds are normal. He exhibits no mass. There is no tenderness. There is no rebound and no guarding.  The abdomen was nontender with no obvious masses or organ enlargement based on examining the patient in his chair.  Genitourinary:  The patient has buttock decubiti bilaterally right greater than left these appear to be on the left side about 1.5" x 1" and on the right side about 2.5" x 2.5". There is slight redness and minimal drainage.   Musculoskeletal: He exhibits no edema or tenderness.  The patient is able to walk but has to move slowly because of his feet. He also has problems with his back.  Lymphadenopathy:    He has no cervical adenopathy.  Neurological: He is alert and oriented to person, place, and time. He has normal reflexes. No cranial nerve deficit.  Skin: Skin is warm and dry. Rash noted. There is erythema.  Bilateral buttock decubiti please see in the description above under genitourinary urinary anorectal section  Psychiatric: He has a normal mood and affect. His behavior is normal. Judgment and thought content normal.  The patient is pleasant and positive.  Nursing note and vitals reviewed.  BP 131/76 mmHg  Pulse 75  Temp(Src) 98.2 F (36.8 C) (Oral)  Ht '5\' 8"'  (1.727 m)  Wt 210 lb (95.255 kg)  BMI 31.94 kg/m2  Diabetic Foot Exam - Simple   Simple Foot Form  Diabetic Foot exam was performed with the following findings:  Yes 06/15/2015 12:15 PM  Visual Inspection  See comments:  Yes  Sensation Testing  See comments:  Yes  Pulse Check  See comments:  Yes  Comments  The patient has extremely high arches bilaterally and flexed toes bilaterally the skin otherwise appears to be intact. He uses coconut or regularly. 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 Type 2 diabetes mellitus with diabetic nephropathy, without long-term current use of insulin ([place]) -Continue with weight loss and diet. Continue monitoring blood sugars at home. Continue current treatment pending results of lab work - BMP8+EGFR - CBC with Differential/Platelet - Bayer DCA Hb A1c Waived  2. Hyperlipemia -Continue current treatment pending results of lab work - Lipid panel - CBC with Differential/Platelet  3. Vitamin D  deficiency -Continue current treatment pending results of lab work - CBC with Differential/Platelet - VITAMIN D 25 Hydroxy (Vit-D Deficiency, Fractures)  4. Essential hypertension -Blood pressure is good today and he will continue with current treatment - BMP8+EGFR - CBC with Differential/Platelet - Hepatic function panel  5. Sacral decubitus ulcer, unspecified pressure ulcer stage -These decubiti appear to be stable with the one on the left buttock may be being smaller and the one on the right larger than previously. There is minimal redness and no purulent drainage. Continue with current treatment - CBC with Differential/Platelet  6. Sacral pressure sore, unspecified pressure ulcer stage -Continue with current treatment - CBC with Differential/Platelet  7. Foot deformities, congenital - CBC with Differential/Platelet  8. Atherosclerosis of native coronary artery of native heart without angina pectoris -Continue with aggressive therapeutic lifestyle changes and follow-up with cardiology - Lipid panel - CBC with Differential/Platelet  9. Special screening for malignant neoplasms, colon - Fecal occult blood, imunochemical; Future  10. Recent urinary tract infection -The patient is doing better with this today. - CBC with Differential/Platelet - Urinalysis, Complete - Urine culture  11. Decubitus ulcer, buttock, unspecified laterality, unstageable ([place]) -A picture was made of the decubiti today and hopefully will be in the record  12. Chronic diastolic CHF (congestive heart failure) ([place]) -Continue follow-up with cardiology  13. Urinary incontinence without sensory awareness -The patient only sees the urologist as needed.  22. Spina bifida of lumbosacral region, unspecified hydrocephalus presence ([place]) -He is doing stable with his spina bifida  31. Chronic kidney disease, stage 3 (moderate) -We will check a BMP today  16. Type 2 diabetes mellitus with hyperlipidemia  ([place]) -Continue current treatment pending results of lab work  No orders of the defined types were placed in this encounter.   Patient Instructions                       Medicare Annual Wellness Visit  [facility] and the medical providers at [facility] strive to bring you the best medical care.  In doing so we not only want to address your current medical conditions and concerns but also to detect new conditions early and prevent illness, disease and health-related problems.    Medicare offers a yearly Wellness Visit which allows our clinical staff to assess your need for preventative services including immunizations, lifestyle education, counseling to decrease risk of preventable diseases and screening for fall risk and other medical concerns.    This visit is provided free of charge (no copay) for all Medicare recipients. The clinical pharmacists at [facility] have begun to conduct these Wellness Visits which will  also include a thorough review of all your medications.    As you primary medical provider recommend that you make an appointment for your Annual Wellness Visit if you have not done so already this year.  You may set up this appointment before you leave today or you may call back [phone]) and schedule an appointment.  Please make sure when you call that you mention that you are scheduling your Annual Wellness Visit with the clinical pharmacist so that the appointment may be made for the proper length of time.     Continue current medications. Continue good therapeutic lifestyle changes which include good diet and exercise. Fall precautions discussed with patient. If an FOBT was given today- please return it to our front desk. If you are over 5 years old - you may need Prevnar 54 or the adult Pneumonia vaccine.  **Flu shots are available--- please call and schedule a FLU-CLINIC appointment**  After your visit with [place] today you  will receive a survey in the mail or online from [employer] regarding your care with [place]. Please take a moment to fill this out. Your feedback is very important to [place] as you can help [place] better understand your patient needs as well as improve your experience and satisfaction. WE CARE ABOUT YOU!!!   The patient should follow-up with cardiology as planned He should take the driver's license form to his ophthalmologist to have this completed We will give him a prescription for diabetic shoes and a prescription for dressings and creams to be applied to his buttock decubiti   Arrie Senate MD

## 2015-06-15 NOTE — Patient Instructions (Addendum)
Medicare Annual Wellness Visit  [facility] [place] and the medical providers at [facility] strive to bring you the best medical care.  In doing so we not only want to address your current medical conditions and concerns but also to detect new conditions early and prevent illness, disease and health-related problems.    Medicare offers a yearly Wellness Visit which allows our clinical staff to assess your need for preventative services including immunizations, lifestyle education, counseling to decrease risk of preventable diseases and screening for fall risk and other medical concerns.    This visit is provided free of charge (no copay) for all Medicare recipients. The clinical pharmacists at [facility] have begun to conduct these Wellness Visits which will also include a thorough review of all your medications.    As you primary medical provider recommend that you make an appointment for your Annual Wellness Visit if you have not done so already this year.  You may set up this appointment before you leave today or you may call back [phone] and schedule an appointment.  Please make sure when you call that you mention that you are scheduling your Annual Wellness Visit with the clinical pharmacist so that the appointment may be made for the proper length of time.     Continue current medications. Continue good therapeutic lifestyle changes which include good diet and exercise. Fall precautions discussed with patient. If an FOBT was given today- please return it to our front desk. If you are over 49 years old - you may need Prevnar 41 or the adult Pneumonia vaccine.  **Flu shots are available--- please call and schedule a FLU-CLINIC appointment**  After your visit with [place] today you will receive a survey in the mail or online from [employer] regarding your care with [place]. Please take a moment to fill this out. Your feedback is very  important to [place] as you can help [place] better understand your patient needs as well as improve your experience and satisfaction. WE CARE ABOUT YOU!!!   The patient should follow-up with cardiology as planned He should take the driver's license form to his ophthalmologist to have this completed We will give him a prescription for diabetic shoes and a prescription for dressings and creams to be applied to his buttock decubiti

## 2015-06-16 LAB — CBC WITH DIFFERENTIAL/PLATELET
BASOS: 0 %
Basophils Absolute: 0 10*3/uL (ref 0.0–0.2)
EOS (ABSOLUTE): 0.1 10*3/uL (ref 0.0–0.4)
Eos: 1 %
Hematocrit: 43.5 % (ref 37.5–51.0)
Hemoglobin: 15.3 g/dL (ref 12.6–17.7)
IMMATURE GRANS (ABS): 0 10*3/uL (ref 0.0–0.1)
Immature Granulocytes: 0 %
LYMPHS ABS: 3.1 10*3/uL (ref 0.7–3.1)
Lymphs: 20 %
MCH: 31.1 pg (ref 26.6–33.0)
MCHC: 35.2 g/dL (ref 31.5–35.7)
MCV: 88 fL (ref 79–97)
MONOS ABS: 1.3 10*3/uL — AB (ref 0.1–0.9)
Monocytes: 8 %
NEUTROS ABS: 10.7 10*3/uL — AB (ref 1.4–7.0)
Neutrophils: 71 %
PLATELETS: 245 10*3/uL (ref 150–379)
RBC: 4.92 x10E6/uL (ref 4.14–5.80)
RDW: 14.5 % (ref 12.3–15.4)
WBC: 15.2 10*3/uL — ABNORMAL HIGH (ref 3.4–10.8)

## 2015-06-16 LAB — HEPATIC FUNCTION PANEL
ALBUMIN: 4.2 g/dL (ref 3.5–4.7)
ALK PHOS: 96 IU/L (ref 39–117)
ALT: 11 IU/L (ref 0–44)
AST: 17 IU/L (ref 0–40)
Bilirubin Total: 0.4 mg/dL (ref 0.0–1.2)
Bilirubin, Direct: 0.12 mg/dL (ref 0.00–0.40)
Total Protein: 6.7 g/dL (ref 6.0–8.5)

## 2015-06-16 LAB — BMP8+EGFR
BUN / CREAT RATIO: 17 (ref 10–24)
BUN: 27 mg/dL (ref 8–27)
CO2: 22 mmol/L (ref 18–29)
Calcium: 9.5 mg/dL (ref 8.6–10.2)
Chloride: 95 mmol/L — ABNORMAL LOW (ref 96–106)
Creatinine, Ser: 1.55 mg/dL — ABNORMAL HIGH (ref 0.76–1.27)
GFR, EST AFRICAN AMERICAN: 47 mL/min/{1.73_m2} — AB (ref >59)
GFR, EST NON AFRICAN AMERICAN: 41 mL/min/{1.73_m2} — AB (ref >59)
Glucose: 128 mg/dL — ABNORMAL HIGH (ref 65–99)
POTASSIUM: 4.6 mmol/L (ref 3.5–5.2)
SODIUM: 138 mmol/L (ref 134–144)

## 2015-06-16 LAB — LIPID PANEL
CHOL/HDL RATIO: 2.9 ratio (ref 0.0–5.0)
Cholesterol, Total: 150 mg/dL (ref 100–199)
HDL: 52 mg/dL (ref >39)
LDL CALC: 74 mg/dL (ref 0–99)
TRIGLYCERIDES: 122 mg/dL (ref 0–149)
VLDL CHOLESTEROL CAL: 24 mg/dL (ref 5–40)

## 2015-06-16 LAB — VITAMIN D 25 HYDROXY (VIT D DEFICIENCY, FRACTURES): VIT D 25 HYDROXY: 46.6 ng/mL (ref 30.0–100.0)

## 2015-06-18 NOTE — Addendum Note (Signed)
Addended by: Wardell Heath on: 06/18/2015 08:27 AM   Modules accepted: Orders

## 2015-06-19 ENCOUNTER — Other Ambulatory Visit: Payer: Self-pay | Admitting: *Deleted

## 2015-06-20 LAB — URINE CULTURE

## 2015-06-22 MED ORDER — CIPROFLOXACIN HCL 250 MG PO TABS: 250.0000 mg | ORAL_TABLET | Freq: Every day | ORAL | Status: DC

## 2015-06-22 NOTE — Addendum Note (Signed)
Addended by: Thana Ates on: 06/22/2015 08:52 AM   Modules accepted: Orders

## 2015-06-26 DIAGNOSIS — H353221 Exudative age-related macular degeneration, left eye, with active choroidal neovascularization: Secondary | ICD-10-CM | POA: Diagnosis not present

## 2015-06-28 ENCOUNTER — Telehealth: Payer: Self-pay | Admitting: Family Medicine

## 2015-06-28 NOTE — Telephone Encounter (Signed)
[employer] called - the form was faxed last Wednesday 06/20/15.

## 2015-07-03 DIAGNOSIS — Z961 Presence of intraocular lens: Secondary | ICD-10-CM | POA: Diagnosis not present

## 2015-07-03 DIAGNOSIS — Z8669 Personal history of other diseases of the nervous system and sense organs: Secondary | ICD-10-CM | POA: Diagnosis not present

## 2015-07-06 ENCOUNTER — Other Ambulatory Visit: Payer: Self-pay

## 2015-07-06 ENCOUNTER — Other Ambulatory Visit: Payer: Self-pay | Admitting: Cardiology

## 2015-07-06 DIAGNOSIS — H52203 Unspecified astigmatism, bilateral: Secondary | ICD-10-CM | POA: Diagnosis not present

## 2015-07-06 DIAGNOSIS — H353221 Exudative age-related macular degeneration, left eye, with active choroidal neovascularization: Secondary | ICD-10-CM | POA: Diagnosis not present

## 2015-07-06 DIAGNOSIS — H26493 Other secondary cataract, bilateral: Secondary | ICD-10-CM | POA: Diagnosis not present

## 2015-07-06 DIAGNOSIS — E119 Type 2 diabetes mellitus without complications: Secondary | ICD-10-CM | POA: Diagnosis not present

## 2015-07-06 LAB — HM DIABETES EYE EXAM

## 2015-07-06 MED ORDER — FUROSEMIDE 20 MG PO TABS: ORAL_TABLET | ORAL | Status: DC

## 2015-07-11 ENCOUNTER — Encounter (HOSPITAL_COMMUNITY): Payer: Self-pay | Admitting: Hematology & Oncology

## 2015-07-11 ENCOUNTER — Encounter (HOSPITAL_COMMUNITY): Payer: Medicare Other | Attending: Hematology & Oncology | Admitting: Hematology & Oncology

## 2015-07-11 VITALS — BP 129/62 | HR 82 | Temp 98.0°F | Resp 20 | Ht 68.0 in | Wt 210.0 lb

## 2015-07-11 DIAGNOSIS — D72829 Elevated white blood cell count, unspecified: Secondary | ICD-10-CM

## 2015-07-11 DIAGNOSIS — L89159 Pressure ulcer of sacral region, unspecified stage: Secondary | ICD-10-CM | POA: Diagnosis not present

## 2015-07-11 DIAGNOSIS — N3942 Incontinence without sensory awareness: Secondary | ICD-10-CM

## 2015-07-11 DIAGNOSIS — R32 Unspecified urinary incontinence: Secondary | ICD-10-CM

## 2015-07-11 DIAGNOSIS — L89300 Pressure ulcer of unspecified buttock, unstageable: Secondary | ICD-10-CM

## 2015-07-11 DIAGNOSIS — Q059 Spina bifida, unspecified: Secondary | ICD-10-CM | POA: Diagnosis not present

## 2015-07-11 DIAGNOSIS — Z87440 Personal history of urinary (tract) infections: Secondary | ICD-10-CM | POA: Diagnosis not present

## 2015-07-11 DIAGNOSIS — D72828 Other elevated white blood cell count: Secondary | ICD-10-CM

## 2015-07-11 DIAGNOSIS — Q057 Lumbar spina bifida without hydrocephalus: Secondary | ICD-10-CM

## 2015-07-11 DIAGNOSIS — N390 Urinary tract infection, site not specified: Secondary | ICD-10-CM

## 2015-07-11 DIAGNOSIS — D7289 Other specified disorders of white blood cells: Secondary | ICD-10-CM | POA: Diagnosis not present

## 2015-07-11 LAB — CBC WITH DIFFERENTIAL/PLATELET
Basophils Absolute: 0 10*3/uL (ref 0.0–0.1)
Basophils Relative: 0 %
EOS ABS: 0.1 10*3/uL (ref 0.0–0.7)
EOS PCT: 1 %
HCT: 46.4 % (ref 39.0–52.0)
Hemoglobin: 15.1 g/dL (ref 13.0–17.0)
LYMPHS ABS: 2.1 10*3/uL (ref 0.7–4.0)
LYMPHS PCT: 17 %
MCH: 30.2 pg (ref 26.0–34.0)
MCHC: 32.5 g/dL (ref 30.0–36.0)
MCV: 92.8 fL (ref 78.0–100.0)
MONO ABS: 1.1 10*3/uL — AB (ref 0.1–1.0)
MONOS PCT: 9 %
Neutro Abs: 9.2 10*3/uL — ABNORMAL HIGH (ref 1.7–7.7)
Neutrophils Relative %: 73 %
PLATELETS: 225 10*3/uL (ref 150–400)
RBC: 5 MIL/uL (ref 4.22–5.81)
RDW: 14.5 % (ref 11.5–15.5)
WBC: 12.5 10*3/uL — ABNORMAL HIGH (ref 4.0–10.5)

## 2015-07-11 LAB — SEDIMENTATION RATE: Sed Rate: 18 mm/hr — ABNORMAL HIGH (ref 0–16)

## 2015-07-11 NOTE — Patient Instructions (Signed)
[facility] at [facility] Discharge Instructions  RECOMMENDATIONS MADE BY THE CONSULTANT AND ANY TEST RESULTS WILL BE SENT TO YOUR REFERRING PHYSICIAN.  Exam and discussion today with Dr. Whitney Muse. Lab work today. Return as scheduled in 2 weeks for follow-up visit.    Thank you for choosing [facility] at [facility] to provide your oncology and hematology care.  To afford each patient quality time with our provider, please arrive at least 15 minutes before your scheduled appointment time.   Beginning January 23rd 2017 lab work for the [facility] will be done in the  Main lab at [facility] on 1st floor. If you have a lab appointment with the [facility] please come in thru the  Main Entrance and check in at the main information desk  You need to re-schedule your appointment should you arrive 10 or more minutes late.  We strive to give you quality time with our providers, and arriving late affects you and other patients whose appointments are after yours.  Also, if you no show three or more times for appointments you may be dismissed from the clinic at the providers discretion.     Again, thank you for choosing [facility]  Our hope is that these requests will decrease the amount of time that you wait before being seen by our physicians.       _____________________________________________________________  Should you have questions after your visit to [facility], please contact our office at [phone] between the hours of 8:30 a.m. and 4:30 p.m.  Voicemails left after 4:30 p.m. will not be returned until the following business day.  For prescription refill requests, have your pharmacy contact our office.         Resources For Cancer Patients and their Caregivers ? American Cancer Society: Can assist with transportation, wigs, general needs, runs Look Good Feel Better.        409-783-9715 ? Cancer  Care: Provides financial assistance, online support groups, medication/co-pay assistance.  1-800-813-HOPE (743)458-5195) ? [facility] Assists [place] cancer patients and their families through emotional , educational and financial support.  819-268-8276 ? [place] DSS Where to apply for food stamps, Medicaid and utility assistance. (445)104-7478 ? [facility]: Transportation to medical appointments. 2163035826 ? Social Security Administration: May apply for disability if have a Stage IV cancer. (934)359-9113 651-569-0258 ? [employer], Disability and Transit Services: Assists with nutrition, care and transit needs. [place] Support Programs: @10RELATIVEDAYS @ > Cancer Support Group  2nd Tuesday of the month 1pm-2pm, Journey Room  > Creative Journey  3rd Tuesday of the month 1130am-1pm, Journey Room  > Look Good Feel Better  1st Wednesday of the month 10am-12 noon, Journey Room (Call [facility] to register [phone])

## 2015-07-11 NOTE — Progress Notes (Signed)
[name]. presented for labwork. Labs per MD order drawn via Peripheral Line 21 gauge needle inserted in left lateral forearm. Good blood return present. Procedure without incident. Needle removed intact. Patient tolerated procedure well.

## 2015-07-11 NOTE — Progress Notes (Signed)
[facility] NOTE  Patient Care Team: Chipper Herb, MD as PCP - General (Family Medicine) Kathie Rhodes, MD as Consulting Physician (Urology) Larey Dresser, MD as Consulting Physician (Cardiology) Steffanie Rainwater, DPM as Consulting Physician (Podiatry) Ralene Muskrat as Physician Assistant (Chiropractic Medicine) Norma Fredrickson, MD as Consulting Physician (Psychiatry) Fran Lowes, MD as Consulting Physician (Nephrology) Mikey Bussing, DDS (Dentistry) Ricard Dillon, MD as Consulting Physician (Internal Medicine) Marygrace Drought, MD as Consulting Physician (Ophthalmology) Harriett Sine, MD as Consulting Physician (Dermatology)  CHIEF COMPLAINTS/PURPOSE OF CONSULTATION:  Elevated WBC Spina Bifida Sacral Decubitus Ulcer Chronic Incontinence  HISTORY OF PRESENTING ILLNESS:  [name]. 82 y.o. male is here because of leukocytosis. On chart review it dates back to 2008. Differential shows predominant neutrophilia with mild monocytosis. He has a history of chronic decubiti, spina bifida, chronic incontinence. Patient also reports ongoing problems with recurrent UTI's.   He comes to the [facility] today accompanied by his sister-in-law. She is his wife's sister. He uses a wheelchair.  He says that what's got him upset today is that he was told by Dr. Laurance Flatten that he was coming over here only for a blood test, but now he's here to see the doctor, and doesn't know what's going on.   In addition to this, he notes that he has family coming in from [place] and [place] for a reunion and so he feels preoccupied about that. He cannot travel so he wants to get to them to celebrate as soon as possible today.   He notes that, in the last year, he's had a bladder/kidney infection that he gets cured and it comes back 5-6 weeks later.   He follows with Dr. Brien Mates for his heart.Marland Kitchen He confirms having a colonoscopy. The only bowel problem he notes is constipation,  saying "I've had that since I was in [place]." He has also had quadruple bypass surgery, with Dr. Redmond Pulling. When asked about his appetite lately, he says it's been "real good." He comments that he has deliberately cut back on eating. When asked about drenching night sweats, he says that he "has sweat sometimes," but he attributes that to PTSD.   When the topic of PTSD comes up, he becomes visibly emotional. He notes that approaching Memorial Day and this time of year always makes him get more emotional.  Currently denies fever, nausea.  MEDICAL HISTORY:  Past Medical History  Diagnosis Date  . CAD (coronary artery disease)     native vessel  . Hyperlipidemia   . HTN (hypertension)   . Carotid artery disease (HCC)     nonobstructive  . Anemia   . Hypokalemia   . Malnutrition ([facility])     protein-calorie  . PTSD (post-traumatic stress disorder)   . Cecal diverticulitis 2008    drained  . Cellulitis, gluteal     bilateral for the past 6 months/notes 01/20/2013  . Myocardial infarction [facility])     "silent; before OHS" (01/20/2013)  . Agent orange exposure 1966 or 1971  . Exertional shortness of breath   . Diabetes mellitus type II   . Arthritis     "hands and back" (01/20/2013)  . Chronic lower back pain   . Anxiety   . Depression   . Chronic renal insufficiency   . Urinary incontinence   . Cataract   . Spina bifida [facility])     SURGICAL HISTORY: Past Surgical History  Procedure Laterality Date  . Spine  surgery  11/24/1932    "Spina bifida surgery"  . Knee surgery Left 1964    "exploratory; sewed it up w/wire" (01/20/2013)  . Coronary artery bypass graft  1998    "CABG X4" (01/20/2013)  . Hemorrhoid banding  ~ 10/2012  . Carpal tunnel release Right 1980's  . Cardiac catheterization  1998    "couple before my OHS" (01/20/2013)  . Cystectomy  2000's    "cytal cyst on my intestines; probed then drained it; hospitalized for 13 days; NPO" (01/20/2013)  . Cataract extraction w/  intraocular lens  implant, bilateral Bilateral ~ 2010  . Eye surgery    . Prostate surgery    . Strabismus surgery Bilateral 03/29/2015    Procedure: REPAIR STRABISMUS BILATERAL;  Surgeon: Lamonte Sakai, MD;  Location: [facility];  Service: Ophthalmology;  Laterality: Bilateral;    SOCIAL HISTORY: Social History   Social History  . Marital Status: Married    Spouse Name: N/A  . Number of Children: N/A  . Years of Education: N/A   Occupational History  . Retired     Corporate treasurer    Social History Main Topics  . Smoking status: Former Smoker -- 2.00 packs/day for 20 years    Types: Cigarettes    Start date: 11/20/1948    Quit date: 08/19/1980  . Smokeless tobacco: Former User    Types: Chew     Comment: 01/20/2013 "quit chewing 20 yr ago"  . Alcohol Use: No     Comment: 01/20/2013 "quit drinking 08/19/1980"  . Drug Use: No  . Sexual Activity: Not Currently   Other Topics Concern  . Not on file   Social History Narrative   Married. He's on his third marriage. His wife will be 9 on Friday. They've been married 34 years.  3 kids with first wife, 1 with second wife, none with third. 4 children total. Current wife has no children. 2 grandchildren. 1 great-grandchild.  He was born [birth year], in [place]. 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 Smoked until 35 years ago. Quit drinking & smoking on the same day.  Other work included police work, Press photographer work.  Hobbies include playing golf, bowling (used to), reading.   FAMILY HISTORY: Family History  Problem Relation Age of Onset  . Heart disease Mother   . Heart attack Mother   . Heart disease Father   . Heart attack Father   . Cancer Sister     breast  . Cancer Sister     lung and ovarian   4 brothers and 2 sisters. All but one are still living. Brother that passed, they think it's due to a heart attack but don't know for sure. He was split from the family and in a closed chapter of his life.  Mother  was 23 when she passed. Died of heart. Father ws 39 when he passed. Died of heart.   ALLERGIES:  is allergic to aspirin; sulfonamide derivatives; and nitrofurantoin.  MEDICATIONS:  Current Outpatient Prescriptions  Medication Sig Dispense Refill  . aspirin 81 MG tablet Take 81 mg by mouth daily.      Marland Kitchen buPROPion (WELLBUTRIN XL) 300 MG 24 hr tablet Take 1 tablet (300 mg total) by mouth daily. 90 tablet 1  . Cholecalciferol (VITAMIN D-3) 5000 UNITS TABS Take 1 tablet by mouth daily.      . Coenzyme Q10 (CO Q-10) 100 MG CAPS Take 1 capsule by mouth 2 (two) times daily.     Marland Kitchen  collagenase (SANTYL) ointment Apply 1 application topically daily. 90 g 3  . furosemide (LASIX) 20 MG tablet TAKE 1 TABLET EVERY MORNING (NEED FOLLOW UP WITH DOCTOR PLEASE CALL AND SCHEDULE AN APPOINTMENT FOR DECEMBER) 90 tablet 1  . glipiZIDE (GLUCOTROL XL) 10 MG 24 hr tablet TAKE 1 TABLET DAILY 90 tablet 1  . JANUMET 50-500 MG tablet TAKE 1 TABLET DAILY WITH FOOD 90 tablet 1  . metoprolol tartrate (LOPRESSOR) 25 MG tablet Take 1 tablet (25 mg total) by mouth 2 (two) times daily. 180 tablet 0  . Multiple Vitamins-Minerals (PRESERVISION AREDS 2 PO) Take 1 capsule by mouth 2 (two) times daily.    . nitroGLYCERIN (NITROSTAT) 0.4 MG SL tablet Place 1 tablet (0.4 mg total) under the tongue every 5 (five) minutes as needed. 25 tablet 6  . NON FORMULARY cran-actin 176m 1 tablet daily    . NON FORMULARY Sweetish Bitters Daily    . Omega-3 Fatty Acids (FISH OIL) 1000 MG CAPS Take 1 capsule by mouth 2 (two) times daily.      . polyethylene glycol powder (GLYCOLAX/MIRALAX) powder Take 17 g by mouth 2 (two) times daily as needed. 850 g 3  . sodium bicarbonate 650 MG tablet Take 650 mg by mouth 3 (three) times daily.  4  . VYTORIN 10-20 MG tablet TAKE 1 TABLET DAILY AS DIRECTED 90 tablet 1   No current facility-administered medications for this visit.    Review of Systems  Constitutional: Negative.   HENT: Negative.   Eyes:  Negative.   Respiratory: Negative.   Cardiovascular: Negative.   Gastrointestinal: Negative.   Genitourinary: Negative.   Musculoskeletal: Negative.   Skin: Negative.   Neurological: Negative.   Endo/Heme/Allergies: Negative.   Psychiatric/Behavioral: Negative.   All other systems reviewed and are negative.  14 point ROS was done and is otherwise as detailed above or in HPI    PHYSICAL EXAMINATION: ECOG PERFORMANCE STATUS: 1, symptomatic  Filed Vitals:   07/11/15 1400  BP: 129/62  Pulse: 82  Temp: 98 F (36.7 C)  Resp: 20   Filed Weights   07/11/15 1400  Weight: 210 lb (95.255 kg)     Physical Exam  Constitutional: He is oriented to person, place, and time and well-developed, well-nourished, and in no distress. No distress.  Uses a wheelchair and wears glasses. Cannot climb up onto the exam table. Exam is performed in the chair.  HENT:  Head: Normocephalic and atraumatic.  Mouth/Throat: Oropharynx is clear and moist. No oropharyngeal exudate.  Eyes: Conjunctivae and EOM are normal. Pupils are equal, round, and reactive to light. Right eye exhibits no discharge. Left eye exhibits no discharge. No scleral icterus.  Neck: Normal range of motion. Neck supple. No thyromegaly present.  Cardiovascular: Normal rate, regular rhythm and normal heart sounds.   Pulmonary/Chest: Effort normal and breath sounds normal. No respiratory distress.  Abdominal: Soft. Bowel sounds are normal. He exhibits no distension. There is no tenderness.  Musculoskeletal: Normal range of motion.  Neurological: He is alert and oriented to person, place, and time. No cranial nerve deficit.  Skin: Skin is warm and dry. He is not diaphoretic.  Psychiatric: Mood, memory, affect and judgment normal.  Nursing note and vitals reviewed.    LABORATORY DATA:  I have reviewed the data as listed Lab Results  Component Value Date   WBC 15.2* 06/15/2015   HGB 13.7* 06/19/2014   HCT 43.5 06/15/2015   MCV  88 06/15/2015   PLT 245 06/15/2015  CMP     Component Value Date/Time   NA 138 06/15/2015 1209   NA 136 09/22/2013 1610   K 4.6 06/15/2015 1209   CL 95* 06/15/2015 1209   CO2 22 06/15/2015 1209   GLUCOSE 128* 06/15/2015 1209   GLUCOSE 249* 09/22/2013 1610   BUN 27 06/15/2015 1209   BUN 24* 09/22/2013 1610   CREATININE 1.55* 06/15/2015 1209   CREATININE 1.58* 08/19/2012 1044   CALCIUM 9.5 06/15/2015 1209   PROT 6.7 06/15/2015 1209   PROT 6.2 08/19/2012 1044   ALBUMIN 4.2 06/15/2015 1209   ALBUMIN 4.0 08/19/2012 1044   AST 17 06/15/2015 1209   ALT 11 06/15/2015 1209   ALKPHOS 96 06/15/2015 1209   BILITOT 0.4 06/15/2015 1209   BILITOT 0.6 02/06/2014 1200   GFRNONAA 41* 06/15/2015 1209   GFRNONAA 41* 08/19/2012 1044   GFRAA 47* 06/15/2015 1209   GFRAA 47* 08/19/2012 1044   Results for DYMOND, SPREEN (MRN [id number]) as of 07/12/2015 20:29  Ref. Range 02/06/2014 12:10 06/19/2014 10:07 10/31/2014 09:57 03/14/2015 11:33 06/15/2015 12:09 07/11/2015 15:10  WBC Latest Ref Range: 4.0-10.5 K/uL 13.1 (A) 12.2 (A) 11.9 (H) 16.8 (H) 15.2 (H) 12.5 (H)    ASSESSMENT & PLAN:  Leukocytosis neutrophilia Spina Bifida Sacral Decubitus Ulcer Chronic Incontinence Recurrent UTI  I reviewed the potential causes of leukocytosis (specifically mild neutrophilia) including but not limited to:  ?Any active inflammatory condition or infection ?Cigarette smoking, which may be the most common cause of mild neutrophilia ?Previously diagnosed hematologic disease (such as acute and chronic leukemias, chronic myeloproliferative or myelodysplastic disease) ?The presence of, and treatment for, a chronic anxiety state, panic disorder, rage, or emotional stress (eg, posttraumatic stress disorder, depression) ?Presence of non-hematologic diseases known to increase neutrophil counts (eg, eclampsia, thyroid storm, hypercortisolism). ?Prior splenectomy or known asplenia ?Positive family history of  neutrophilia ?Recent vaccination  Medications - Various medications may cause neutrophilia. However,  such cases are rare and appear in the literature as isolated case reports.  Plan today is to proceed with a CBC with peripheral smear review, evaluation for MPD, BCR-ABL to r/o CML, CRP and ESR to look for occult inflammatory disease  I will see him back once lab results have come back  in two weeks. We will go over everything at that time. He is agreeable to a return visit.   Orders Placed This Encounter  Procedures  . Sedimentation rate  . Pathologist smear review  . CBC with Differential  . BCR-ABL1, CML/ALL, PCR, QUANT  . JAK2 V617F, Rfx CALR/E12/MPL   All questions were answered. The patient knows to call the clinic with any problems, questions or concerns.  This document serves as a record of services personally performed by Ancil Linsey, MD. It was created on her behalf by Toni Amend, a trained medical scribe. The creation of this record is based on the scribe's personal observations and the provider's statements to them. This document has been checked and approved by the attending provider.  I have reviewed the above documentation for accuracy and completeness and I agree with the above.  This note was electronically signed.  Molli Hazard, MD  07/11/2015 3:17 PM

## 2015-07-12 ENCOUNTER — Other Ambulatory Visit: Payer: Medicare Other

## 2015-07-12 ENCOUNTER — Encounter (HOSPITAL_COMMUNITY): Payer: Self-pay | Admitting: Hematology & Oncology

## 2015-07-12 DIAGNOSIS — M9901 Segmental and somatic dysfunction of cervical region: Secondary | ICD-10-CM | POA: Diagnosis not present

## 2015-07-12 DIAGNOSIS — B965 Pseudomonas (aeruginosa) (mallei) (pseudomallei) as the cause of diseases classified elsewhere: Secondary | ICD-10-CM | POA: Diagnosis not present

## 2015-07-12 DIAGNOSIS — M9903 Segmental and somatic dysfunction of lumbar region: Secondary | ICD-10-CM | POA: Diagnosis not present

## 2015-07-12 DIAGNOSIS — M9902 Segmental and somatic dysfunction of thoracic region: Secondary | ICD-10-CM | POA: Diagnosis not present

## 2015-07-12 DIAGNOSIS — A498 Other bacterial infections of unspecified site: Secondary | ICD-10-CM

## 2015-07-12 DIAGNOSIS — R300 Dysuria: Secondary | ICD-10-CM

## 2015-07-12 DIAGNOSIS — M5137 Other intervertebral disc degeneration, lumbosacral region: Secondary | ICD-10-CM | POA: Diagnosis not present

## 2015-07-12 LAB — PATHOLOGIST SMEAR REVIEW

## 2015-07-13 ENCOUNTER — Ambulatory Visit (HOSPITAL_COMMUNITY): Payer: TRICARE For Life (TFL) | Admitting: Hematology & Oncology

## 2015-07-16 LAB — BCR-ABL1, CML/ALL, PCR, QUANT

## 2015-07-16 LAB — URINE CULTURE

## 2015-07-17 ENCOUNTER — Telehealth: Payer: Self-pay | Admitting: Family Medicine

## 2015-07-17 MED ORDER — CIPROFLOXACIN HCL 250 MG PO TABS
250.0000 mg | ORAL_TABLET | Freq: Two times a day (BID) | ORAL | Status: DC
Start: 2015-07-17 — End: 2015-08-24

## 2015-07-17 NOTE — Telephone Encounter (Signed)
After looking at his urine culture from yesterday cipro was supposed to be sent into the pharmacy per [facility]. Rx sent in and pt is aware.

## 2015-07-18 ENCOUNTER — Ambulatory Visit: Payer: Medicare Other | Admitting: Family Medicine

## 2015-07-23 LAB — CALR + JAK2 E12-15 + MPL (REFLEXED)

## 2015-07-23 LAB — JAK2 V617F, W REFLEX TO CALR/E12/MPL

## 2015-07-25 ENCOUNTER — Ambulatory Visit (HOSPITAL_COMMUNITY): Payer: Medicare Other | Admitting: Hematology & Oncology

## 2015-07-26 ENCOUNTER — Other Ambulatory Visit: Payer: Self-pay | Admitting: Cardiology

## 2015-07-30 ENCOUNTER — Encounter (HOSPITAL_COMMUNITY): Payer: Medicare Other | Attending: Hematology & Oncology | Admitting: Hematology & Oncology

## 2015-07-30 ENCOUNTER — Encounter: Payer: Self-pay | Admitting: *Deleted

## 2015-07-30 ENCOUNTER — Encounter (HOSPITAL_COMMUNITY): Payer: Self-pay | Admitting: Hematology & Oncology

## 2015-07-30 ENCOUNTER — Other Ambulatory Visit (HOSPITAL_COMMUNITY): Payer: Self-pay | Admitting: *Deleted

## 2015-07-30 VITALS — BP 140/58 | HR 78 | Temp 98.0°F | Resp 16 | Wt 218.2 lb

## 2015-07-30 DIAGNOSIS — D72829 Elevated white blood cell count, unspecified: Secondary | ICD-10-CM

## 2015-07-30 DIAGNOSIS — N390 Urinary tract infection, site not specified: Secondary | ICD-10-CM

## 2015-07-30 DIAGNOSIS — L89300 Pressure ulcer of unspecified buttock, unstageable: Secondary | ICD-10-CM | POA: Diagnosis not present

## 2015-07-30 DIAGNOSIS — D72828 Other elevated white blood cell count: Secondary | ICD-10-CM

## 2015-07-30 DIAGNOSIS — N3942 Incontinence without sensory awareness: Secondary | ICD-10-CM

## 2015-07-30 NOTE — Patient Instructions (Signed)
[facility] at [facility] Discharge Instructions  RECOMMENDATIONS MADE BY THE CONSULTANT AND ANY TEST RESULTS WILL BE SENT TO YOUR REFERRING PHYSICIAN.  Return to clinic in 6 months with lab work.   Thank you for choosing [facility] at [facility] to provide your oncology and hematology care.  To afford each patient quality time with our provider, please arrive at least 15 minutes before your scheduled appointment time.   Beginning January 23rd 2017 lab work for the [facility] will be done in the  Main lab at [facility] on 1st floor. If you have a lab appointment with the [facility] please come in thru the  Main Entrance and check in at the main information desk  You need to re-schedule your appointment should you arrive 10 or more minutes late.  We strive to give you quality time with our providers, and arriving late affects you and other patients whose appointments are after yours.  Also, if you no show three or more times for appointments you may be dismissed from the clinic at the providers discretion.     Again, thank you for choosing [facility].  Our hope is that these requests will decrease the amount of time that you wait before being seen by our physicians.       _____________________________________________________________  Should you have questions after your visit to [facility], please contact our office at [phone] between the hours of 8:30 a.m. and 4:30 p.m.  Voicemails left after 4:30 p.m. will not be returned until the following business day.  For prescription refill requests, have your pharmacy contact our office.         Resources For Cancer Patients and their Caregivers ? American Cancer Society: Can assist with transportation, wigs, general needs, runs Look Good Feel Better.        567-677-9929 ? Cancer Care: Provides financial assistance, online support groups, medication/co-pay  assistance.  1-800-813-HOPE 670-741-0075) ? [place] Assists [place] cancer patients and their families through emotional , educational and financial support.  8141262040 ? [place] DSS Where to apply for food stamps, Medicaid and utility assistance. 575-515-0443 ? [facility]: Transportation to medical appointments. 458-380-1518 ? Social Security Administration: May apply for disability if have a Stage IV cancer. (301)346-3245 872-847-0265 ? [employer], Disability and Transit Services: Assists with nutrition, care and transit needs. [place] Support Programs: @10RELATIVEDAYS @ > Cancer Support Group  2nd Tuesday of the month 1pm-2pm, Journey Room  > Creative Journey  3rd Tuesday of the month 1130am-1pm, Journey Room  > Look Good Feel Better  1st Wednesday of the month 10am-12 noon, Journey Room (Call [facility] to register [phone])

## 2015-07-30 NOTE — Progress Notes (Signed)
[facility] NOTE  Patient Care Team: Chipper Herb, MD as PCP - General (Family Medicine) Kathie Rhodes, MD as Consulting Physician (Urology) Larey Dresser, MD as Consulting Physician (Cardiology) Steffanie Rainwater, DPM as Consulting Physician (Podiatry) Ralene Muskrat as Physician Assistant (Chiropractic Medicine) Norma Fredrickson, MD as Consulting Physician (Psychiatry) Fran Lowes, MD as Consulting Physician (Nephrology) Mikey Bussing, DDS (Dentistry) Ricard Dillon, MD as Consulting Physician (Internal Medicine) Marygrace Drought, MD as Consulting Physician (Ophthalmology) Harriett Sine, MD as Consulting Physician (Dermatology)  CHIEF COMPLAINTS/PURPOSE OF CONSULTATION:  Elevated WBC Spina Bifida Sacral Decubitus Ulcer Chronic Incontinence  HISTORY OF PRESENTING ILLNESS:  [name]. 82 y.o. male is here because of leukocytosis. On chart review it dates back to 2008. Differential shows predominant neutrophilia with mild monocytosis. He has a history of chronic decubiti, spina bifida, chronic incontinence. Patient also reports ongoing problems with recurrent UTI's.   He presents today to review laboratory studies obtained at his last visit. He notes that he has been treated for a UTI since his last visit here. His wife is with him today. They discussed his ongoing difficulties with wound healing and skin breakdown in the buttock and sacral area. They have been to wound care in [place] before and think they may need to go back.  He denies fever or chills. Appetite is good.   MEDICAL HISTORY:  Past Medical History  Diagnosis Date  . CAD (coronary artery disease)     native vessel  . Hyperlipidemia   . HTN (hypertension)   . Carotid artery disease (HCC)     nonobstructive  . Anemia   . Hypokalemia   . Malnutrition ([place])     protein-calorie  . PTSD (post-traumatic stress disorder)   . Cecal diverticulitis 2008    drained  . Cellulitis,  gluteal     bilateral for the past 6 months/notes 01/20/2013  . Myocardial infarction [facility])     "silent; before OHS" (01/20/2013)  . Agent orange exposure 1966 or 1971  . Exertional shortness of breath   . Diabetes mellitus type II   . Arthritis     "hands and back" (01/20/2013)  . Chronic lower back pain   . Anxiety   . Depression   . Chronic renal insufficiency   . Urinary incontinence   . Cataract   . Spina bifida [facility])     SURGICAL HISTORY: Past Surgical History  Procedure Laterality Date  . Spine surgery  11/24/1932    "Spina bifida surgery"  . Knee surgery Left 1964    "exploratory; sewed it up w/wire" (01/20/2013)  . Coronary artery bypass graft  1998    "CABG X4" (01/20/2013)  . Hemorrhoid banding  ~ 10/2012  . Carpal tunnel release Right 1980's  . Cardiac catheterization  1998    "couple before my OHS" (01/20/2013)  . Cystectomy  2000's    "cytal cyst on my intestines; probed then drained it; hospitalized for 13 days; NPO" (01/20/2013)  . Cataract extraction w/ intraocular lens  implant, bilateral Bilateral ~ 2010  . Eye surgery    . Prostate surgery    . Strabismus surgery Bilateral 03/29/2015    Procedure: REPAIR STRABISMUS BILATERAL;  Surgeon: Lamonte Sakai, MD;  Location: [place];  Service: Ophthalmology;  Laterality: Bilateral;    SOCIAL HISTORY: Social History   Social History  . Marital Status: Married    Spouse Name: N/A  . Number of Children: N/A  .  Years of Education: N/A   Occupational History  . Retired     Corporate treasurer    Social History Main Topics  . Smoking status: Former Smoker -- 2.00 packs/day for 20 years    Types: Cigarettes    Start date: 11/20/1948    Quit date: 08/19/1980  . Smokeless tobacco: Former User    Types: Chew     Comment: 01/20/2013 "quit chewing 20 yr ago"  . Alcohol Use: No     Comment: 01/20/2013 "quit drinking 08/19/1980"  . Drug Use: No  . Sexual Activity: Not Currently   Other Topics Concern  . Not  on file   Social History Narrative   Married. He's on his third marriage. His wife will be 44 on Friday. They've been married 34 years.  3 kids with first wife, 1 with second wife, none with third. 4 children total. Current wife has no children. 2 grandchildren. 1 great-grandchild.  He was born 02-10-1931, in [place]. 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 Smoked until 35 years ago. Quit drinking & smoking on the same day.  Other work included police work, Press photographer work.  Hobbies include playing golf, bowling (used to), reading.   FAMILY HISTORY: Family History  Problem Relation Age of Onset  . Heart disease Mother   . Heart attack Mother   . Heart disease Father   . Heart attack Father   . Cancer Sister     breast  . Cancer Sister     lung and ovarian   4 brothers and 2 sisters. All but one are still living. Brother that passed, they think it's due to a heart attack but don't know for sure. He was split from the family and in a closed chapter of his life.  Mother was 79 when she passed. Died of heart. Father ws 41 when he passed. Died of heart.   ALLERGIES:  is allergic to aspirin; sulfonamide derivatives; and nitrofurantoin.  MEDICATIONS:  Current Outpatient Prescriptions  Medication Sig Dispense Refill  . aspirin 81 MG tablet Take 81 mg by mouth daily.      Marland Kitchen buPROPion (WELLBUTRIN XL) 300 MG 24 hr tablet Take 1 tablet (300 mg total) by mouth daily. 90 tablet 1  . Cholecalciferol (VITAMIN D-3) 5000 UNITS TABS Take 1 tablet by mouth daily.      . ciprofloxacin (CIPRO) 250 MG tablet Take 1 tablet (250 mg total) by mouth 2 (two) times daily. 20 tablet 0  . Coenzyme Q10 (CO Q-10) 100 MG CAPS Take 1 capsule by mouth 2 (two) times daily.     . collagenase (SANTYL) ointment Apply 1 application topically daily. 90 g 3  . furosemide (LASIX) 20 MG tablet TAKE 1 TABLET EVERY MORNING (NEED FOLLOW UP WITH DOCTOR PLEASE CALL AND SCHEDULE AN APPOINTMENT FOR DECEMBER) 90 tablet 1    . glipiZIDE (GLUCOTROL XL) 10 MG 24 hr tablet TAKE 1 TABLET DAILY 90 tablet 1  . JANUMET 50-500 MG tablet TAKE 1 TABLET DAILY WITH FOOD 90 tablet 1  . metoprolol tartrate (LOPRESSOR) 25 MG tablet Take 1 tablet (25 mg total) by mouth 2 (two) times daily. 180 tablet 3  . Multiple Vitamins-Minerals (PRESERVISION AREDS 2 PO) Take 1 capsule by mouth 2 (two) times daily.    . nitroGLYCERIN (NITROSTAT) 0.4 MG SL tablet Place 1 tablet (0.4 mg total) under the tongue every 5 (five) minutes as needed. 25 tablet 6  . NON FORMULARY cran-actin 100mg  1 tablet daily    .  NON FORMULARY Sweetish Bitters Daily    . Omega-3 Fatty Acids (FISH OIL) 1000 MG CAPS Take 1 capsule by mouth 2 (two) times daily.      . polyethylene glycol powder (GLYCOLAX/MIRALAX) powder Take 17 g by mouth 2 (two) times daily as needed. 850 g 3  . sodium bicarbonate 650 MG tablet Take 650 mg by mouth 3 (three) times daily.  4  . VYTORIN 10-20 MG tablet TAKE 1 TABLET DAILY AS DIRECTED 90 tablet 1   No current facility-administered medications for this visit.    Review of Systems  Constitutional: Negative.   HENT: Negative.   Eyes: Negative.   Respiratory: Negative.   Cardiovascular: Negative.   Gastrointestinal: Negative.   Genitourinary: Negative.   Musculoskeletal: Negative.   Skin: Negative.   Neurological: Negative.   Endo/Heme/Allergies: Negative.   Psychiatric/Behavioral: Negative.   All other systems reviewed and are negative. 14 point ROS was done and is otherwise as detailed above or in HPI   PHYSICAL EXAMINATION: ECOG PERFORMANCE STATUS: 1, symptomatic  Filed Vitals:   07/30/15 1148  BP: 140/58  Pulse: 78  Temp: 98 F (36.7 C)  Resp: 16   Filed Weights   07/30/15 1148  Weight: 218 lb 3.2 oz (98.975 kg)     Physical Exam  Constitutional: He is oriented to person, place, and time and well-developed, well-nourished, and in no distress. No distress.  Uses a wheelchair and wears glasses. Cannot climb up  onto the exam table. Exam is performed in the chair.  HENT:  Head: Normocephalic and atraumatic.  Mouth/Throat: Oropharynx is clear and moist. No oropharyngeal exudate.  Eyes: Conjunctivae and EOM are normal. Pupils are equal, round, and reactive to light. Right eye exhibits no discharge. Left eye exhibits no discharge. No scleral icterus.  Neck: Normal range of motion. Neck supple. No thyromegaly present.  Cardiovascular: Normal rate, regular rhythm and normal heart sounds.   Pulmonary/Chest: Effort normal and breath sounds normal. No respiratory distress.  Abdominal: Soft. Bowel sounds are normal. He exhibits no distension. There is no tenderness.  Musculoskeletal: Normal range of motion.  Neurological: He is alert and oriented to person, place, and time. No cranial nerve deficit.  Skin: Skin is warm and dry. He is not diaphoretic.  Psychiatric: Mood, memory, affect and judgment normal.  Nursing note and vitals reviewed.    LABORATORY DATA:  I have reviewed the data as listed Lab Results  Component Value Date   WBC 12.5* 07/11/2015   HGB 15.1 07/11/2015   HCT 46.4 07/11/2015   MCV 92.8 07/11/2015   PLT 225 07/11/2015   CMP     Component Value Date/Time   NA 138 06/15/2015 1209   NA 136 09/22/2013 1610   K 4.6 06/15/2015 1209   CL 95* 06/15/2015 1209   CO2 22 06/15/2015 1209   GLUCOSE 128* 06/15/2015 1209   GLUCOSE 249* 09/22/2013 1610   BUN 27 06/15/2015 1209   BUN 24* 09/22/2013 1610   CREATININE 1.55* 06/15/2015 1209   CREATININE 1.58* 08/19/2012 1044   CALCIUM 9.5 06/15/2015 1209   PROT 6.7 06/15/2015 1209   PROT 6.2 08/19/2012 1044   ALBUMIN 4.2 06/15/2015 1209   ALBUMIN 4.0 08/19/2012 1044   AST 17 06/15/2015 1209   ALT 11 06/15/2015 1209   ALKPHOS 96 06/15/2015 1209   BILITOT 0.4 06/15/2015 1209   BILITOT 0.6 02/06/2014 1200   GFRNONAA 41* 06/15/2015 1209   GFRNONAA 41* 08/19/2012 1044   GFRAA 47* 06/15/2015 1209  GFRAA 47* 08/19/2012 1044   Results  for AUN, VERRICO (MRN [id number]) as of 07/12/2015 20:29  Ref. Range 02/06/2014 12:10 06/19/2014 10:07 10/31/2014 09:57 03/14/2015 11:33 06/15/2015 12:09 07/11/2015 15:10  WBC Latest Ref Range: 4.0-10.5 K/uL 13.1 (A) 12.2 (A) 11.9 (H) 16.8 (H) 15.2 (H) 12.5 (H)      MPL mutation negative CALR mutation negative JAK 2 V617F mutation negative JAK 2 EXON 12 mutation negative BCR ABL negative      ASSESSMENT & PLAN:  Leukocytosis neutrophilia Spina Bifida Sacral Decubitus Ulcer Chronic Incontinence Recurrent UTI  Laboratory studies from his last visit were reviewed in detail.  I advised him I do not see evidence of lymphoproliferative disease/monoclonality. I suspect his leukocytosis is reactive to several issues; recurrent UTI's and his chronic skin breakdown, sacral decubitus.  I would like to see him back in 6 months for repeat labs. If they remain stable, we discussed discharge back to his primary physician.   Orders Placed This Encounter  Procedures  . CBC with Differential    Standing Status: Future     Number of Occurrences:      Standing Expiration Date: 01/28/2018  . Pathologist smear review    Standing Status: Future     Number of Occurrences:      Standing Expiration Date: 07/29/2016   All questions were answered. The patient knows to call the clinic with any problems, questions or concerns.  This document serves as a record of services personally performed by Ancil Linsey, MD. It was created on her behalf by Toni Amend, a trained medical scribe. The creation of this record is based on the scribe's personal observations and the provider's statements to them. This document has been checked and approved by the attending provider.  I have reviewed the above documentation for accuracy and completeness and I agree with the above.  This note was electronically signed.  Molli Hazard, MD  07/30/2015 11:52 AM

## 2015-07-31 DIAGNOSIS — H353221 Exudative age-related macular degeneration, left eye, with active choroidal neovascularization: Secondary | ICD-10-CM | POA: Diagnosis not present

## 2015-08-09 ENCOUNTER — Other Ambulatory Visit: Payer: Medicare Other

## 2015-08-09 DIAGNOSIS — R300 Dysuria: Secondary | ICD-10-CM

## 2015-08-09 LAB — URINALYSIS, COMPLETE
Bilirubin, UA: NEGATIVE
GLUCOSE, UA: NEGATIVE
Ketones, UA: NEGATIVE
NITRITE UA: NEGATIVE
PH UA: 6.5 (ref 5.0–7.5)
Protein, UA: NEGATIVE
RBC, UA: NEGATIVE
Specific Gravity, UA: 1.015 (ref 1.005–1.030)
UUROB: 1 mg/dL (ref 0.2–1.0)

## 2015-08-09 LAB — MICROSCOPIC EXAMINATION: Epithelial Cells (non renal): NONE SEEN /hpf (ref 0–10)

## 2015-08-10 ENCOUNTER — Other Ambulatory Visit: Payer: Self-pay | Admitting: *Deleted

## 2015-08-10 LAB — URINE CULTURE

## 2015-08-10 MED ORDER — AMOXICILLIN-POT CLAVULANATE 875-125 MG PO TABS: 1.0000 | ORAL_TABLET | Freq: Two times a day (BID) | ORAL | Status: DC

## 2015-08-14 DIAGNOSIS — L84 Corns and callosities: Secondary | ICD-10-CM | POA: Diagnosis not present

## 2015-08-14 DIAGNOSIS — B351 Tinea unguium: Secondary | ICD-10-CM | POA: Diagnosis not present

## 2015-08-14 DIAGNOSIS — E1151 Type 2 diabetes mellitus with diabetic peripheral angiopathy without gangrene: Secondary | ICD-10-CM | POA: Diagnosis not present

## 2015-08-14 NOTE — Addendum Note (Signed)
Addended by: Jamelle Haring on: 08/14/2015 10:29 AM   Modules accepted: Orders

## 2015-08-20 ENCOUNTER — Encounter (HOSPITAL_BASED_OUTPATIENT_CLINIC_OR_DEPARTMENT_OTHER): Payer: Medicare Other | Attending: Internal Medicine

## 2015-08-20 DIAGNOSIS — Z7982 Long term (current) use of aspirin: Secondary | ICD-10-CM | POA: Insufficient documentation

## 2015-08-20 DIAGNOSIS — L89312 Pressure ulcer of right buttock, stage 2: Secondary | ICD-10-CM | POA: Diagnosis not present

## 2015-08-20 DIAGNOSIS — Q059 Spina bifida, unspecified: Secondary | ICD-10-CM | POA: Diagnosis not present

## 2015-08-20 DIAGNOSIS — R32 Unspecified urinary incontinence: Secondary | ICD-10-CM | POA: Diagnosis not present

## 2015-08-20 DIAGNOSIS — I509 Heart failure, unspecified: Secondary | ICD-10-CM | POA: Diagnosis not present

## 2015-08-20 DIAGNOSIS — B353 Tinea pedis: Secondary | ICD-10-CM | POA: Diagnosis not present

## 2015-08-20 DIAGNOSIS — I110 Hypertensive heart disease with heart failure: Secondary | ICD-10-CM | POA: Diagnosis not present

## 2015-08-20 DIAGNOSIS — L89322 Pressure ulcer of left buttock, stage 2: Secondary | ICD-10-CM | POA: Diagnosis not present

## 2015-08-20 DIAGNOSIS — M069 Rheumatoid arthritis, unspecified: Secondary | ICD-10-CM | POA: Insufficient documentation

## 2015-08-20 DIAGNOSIS — Z79899 Other long term (current) drug therapy: Secondary | ICD-10-CM | POA: Insufficient documentation

## 2015-08-22 ENCOUNTER — Other Ambulatory Visit: Payer: Medicare Other

## 2015-08-22 DIAGNOSIS — R300 Dysuria: Secondary | ICD-10-CM | POA: Diagnosis not present

## 2015-08-22 LAB — URINALYSIS, COMPLETE
BILIRUBIN UA: NEGATIVE
GLUCOSE, UA: NEGATIVE
KETONES UA: NEGATIVE
Nitrite, UA: NEGATIVE
Protein, UA: NEGATIVE
RBC UA: NEGATIVE
SPEC GRAV UA: 1.02 (ref 1.005–1.030)
Urobilinogen, Ur: 0.2 mg/dL (ref 0.2–1.0)
pH, UA: 5.5 (ref 5.0–7.5)

## 2015-08-22 LAB — MICROSCOPIC EXAMINATION: BACTERIA UA: NONE SEEN

## 2015-08-24 LAB — URINE CULTURE

## 2015-08-24 MED ORDER — CIPROFLOXACIN HCL 250 MG PO TABS: 250.0000 mg | ORAL_TABLET | Freq: Two times a day (BID) | ORAL | Status: DC

## 2015-08-27 ENCOUNTER — Encounter (HOSPITAL_BASED_OUTPATIENT_CLINIC_OR_DEPARTMENT_OTHER): Payer: Medicare Other | Attending: Internal Medicine

## 2015-08-27 DIAGNOSIS — I110 Hypertensive heart disease with heart failure: Secondary | ICD-10-CM | POA: Diagnosis not present

## 2015-08-27 DIAGNOSIS — M069 Rheumatoid arthritis, unspecified: Secondary | ICD-10-CM | POA: Insufficient documentation

## 2015-08-27 DIAGNOSIS — Q059 Spina bifida, unspecified: Secondary | ICD-10-CM | POA: Diagnosis not present

## 2015-08-27 DIAGNOSIS — L89322 Pressure ulcer of left buttock, stage 2: Secondary | ICD-10-CM | POA: Diagnosis not present

## 2015-08-27 DIAGNOSIS — B353 Tinea pedis: Secondary | ICD-10-CM | POA: Insufficient documentation

## 2015-08-27 DIAGNOSIS — I509 Heart failure, unspecified: Secondary | ICD-10-CM | POA: Insufficient documentation

## 2015-08-27 DIAGNOSIS — I2510 Atherosclerotic heart disease of native coronary artery without angina pectoris: Secondary | ICD-10-CM | POA: Insufficient documentation

## 2015-08-27 DIAGNOSIS — L89312 Pressure ulcer of right buttock, stage 2: Secondary | ICD-10-CM | POA: Diagnosis not present

## 2015-08-31 DIAGNOSIS — R809 Proteinuria, unspecified: Secondary | ICD-10-CM | POA: Diagnosis not present

## 2015-08-31 DIAGNOSIS — D509 Iron deficiency anemia, unspecified: Secondary | ICD-10-CM | POA: Diagnosis not present

## 2015-08-31 DIAGNOSIS — I10 Essential (primary) hypertension: Secondary | ICD-10-CM | POA: Diagnosis not present

## 2015-08-31 DIAGNOSIS — Z79899 Other long term (current) drug therapy: Secondary | ICD-10-CM | POA: Diagnosis not present

## 2015-08-31 DIAGNOSIS — E559 Vitamin D deficiency, unspecified: Secondary | ICD-10-CM | POA: Diagnosis not present

## 2015-08-31 DIAGNOSIS — N183 Chronic kidney disease, stage 3 (moderate): Secondary | ICD-10-CM | POA: Diagnosis not present

## 2015-09-03 DIAGNOSIS — Q059 Spina bifida, unspecified: Secondary | ICD-10-CM | POA: Diagnosis not present

## 2015-09-03 DIAGNOSIS — I110 Hypertensive heart disease with heart failure: Secondary | ICD-10-CM | POA: Diagnosis not present

## 2015-09-03 DIAGNOSIS — B353 Tinea pedis: Secondary | ICD-10-CM | POA: Diagnosis not present

## 2015-09-03 DIAGNOSIS — L89322 Pressure ulcer of left buttock, stage 2: Secondary | ICD-10-CM | POA: Diagnosis not present

## 2015-09-03 DIAGNOSIS — L89312 Pressure ulcer of right buttock, stage 2: Secondary | ICD-10-CM | POA: Diagnosis not present

## 2015-09-03 DIAGNOSIS — I509 Heart failure, unspecified: Secondary | ICD-10-CM | POA: Diagnosis not present

## 2015-09-10 DIAGNOSIS — Q059 Spina bifida, unspecified: Secondary | ICD-10-CM | POA: Diagnosis not present

## 2015-09-10 DIAGNOSIS — I509 Heart failure, unspecified: Secondary | ICD-10-CM | POA: Diagnosis not present

## 2015-09-10 DIAGNOSIS — I110 Hypertensive heart disease with heart failure: Secondary | ICD-10-CM | POA: Diagnosis not present

## 2015-09-10 DIAGNOSIS — B353 Tinea pedis: Secondary | ICD-10-CM | POA: Diagnosis not present

## 2015-09-10 DIAGNOSIS — L89322 Pressure ulcer of left buttock, stage 2: Secondary | ICD-10-CM | POA: Diagnosis not present

## 2015-09-10 DIAGNOSIS — L89312 Pressure ulcer of right buttock, stage 2: Secondary | ICD-10-CM | POA: Diagnosis not present

## 2015-09-13 DIAGNOSIS — H353211 Exudative age-related macular degeneration, right eye, with active choroidal neovascularization: Secondary | ICD-10-CM | POA: Diagnosis not present

## 2015-09-17 DIAGNOSIS — I509 Heart failure, unspecified: Secondary | ICD-10-CM | POA: Diagnosis not present

## 2015-09-17 DIAGNOSIS — Q059 Spina bifida, unspecified: Secondary | ICD-10-CM | POA: Diagnosis not present

## 2015-09-17 DIAGNOSIS — B353 Tinea pedis: Secondary | ICD-10-CM | POA: Diagnosis not present

## 2015-09-17 DIAGNOSIS — L89312 Pressure ulcer of right buttock, stage 2: Secondary | ICD-10-CM | POA: Diagnosis not present

## 2015-09-17 DIAGNOSIS — I110 Hypertensive heart disease with heart failure: Secondary | ICD-10-CM | POA: Diagnosis not present

## 2015-09-17 DIAGNOSIS — L89322 Pressure ulcer of left buttock, stage 2: Secondary | ICD-10-CM | POA: Diagnosis not present

## 2015-09-18 DIAGNOSIS — N312 Flaccid neuropathic bladder, not elsewhere classified: Secondary | ICD-10-CM | POA: Diagnosis not present

## 2015-09-18 DIAGNOSIS — N481 Balanitis: Secondary | ICD-10-CM | POA: Diagnosis not present

## 2015-09-18 DIAGNOSIS — N3000 Acute cystitis without hematuria: Secondary | ICD-10-CM | POA: Diagnosis not present

## 2015-09-24 DIAGNOSIS — Q059 Spina bifida, unspecified: Secondary | ICD-10-CM | POA: Diagnosis not present

## 2015-09-24 DIAGNOSIS — B353 Tinea pedis: Secondary | ICD-10-CM | POA: Diagnosis not present

## 2015-09-24 DIAGNOSIS — L89322 Pressure ulcer of left buttock, stage 2: Secondary | ICD-10-CM | POA: Diagnosis not present

## 2015-09-24 DIAGNOSIS — L89312 Pressure ulcer of right buttock, stage 2: Secondary | ICD-10-CM | POA: Diagnosis not present

## 2015-09-24 DIAGNOSIS — I110 Hypertensive heart disease with heart failure: Secondary | ICD-10-CM | POA: Diagnosis not present

## 2015-09-24 DIAGNOSIS — I509 Heart failure, unspecified: Secondary | ICD-10-CM | POA: Diagnosis not present

## 2015-10-01 ENCOUNTER — Encounter (HOSPITAL_BASED_OUTPATIENT_CLINIC_OR_DEPARTMENT_OTHER): Payer: Medicare Other | Attending: Internal Medicine

## 2015-10-01 DIAGNOSIS — L89322 Pressure ulcer of left buttock, stage 2: Secondary | ICD-10-CM | POA: Diagnosis not present

## 2015-10-01 DIAGNOSIS — L89312 Pressure ulcer of right buttock, stage 2: Secondary | ICD-10-CM | POA: Insufficient documentation

## 2015-10-01 DIAGNOSIS — I509 Heart failure, unspecified: Secondary | ICD-10-CM | POA: Insufficient documentation

## 2015-10-01 DIAGNOSIS — B353 Tinea pedis: Secondary | ICD-10-CM | POA: Diagnosis not present

## 2015-10-01 DIAGNOSIS — I2510 Atherosclerotic heart disease of native coronary artery without angina pectoris: Secondary | ICD-10-CM | POA: Insufficient documentation

## 2015-10-01 DIAGNOSIS — Q059 Spina bifida, unspecified: Secondary | ICD-10-CM | POA: Diagnosis not present

## 2015-10-01 DIAGNOSIS — I110 Hypertensive heart disease with heart failure: Secondary | ICD-10-CM | POA: Insufficient documentation

## 2015-10-01 DIAGNOSIS — M069 Rheumatoid arthritis, unspecified: Secondary | ICD-10-CM | POA: Insufficient documentation

## 2015-10-08 DIAGNOSIS — I110 Hypertensive heart disease with heart failure: Secondary | ICD-10-CM | POA: Diagnosis not present

## 2015-10-08 DIAGNOSIS — Q059 Spina bifida, unspecified: Secondary | ICD-10-CM | POA: Diagnosis not present

## 2015-10-08 DIAGNOSIS — L89322 Pressure ulcer of left buttock, stage 2: Secondary | ICD-10-CM | POA: Diagnosis not present

## 2015-10-08 DIAGNOSIS — B353 Tinea pedis: Secondary | ICD-10-CM | POA: Diagnosis not present

## 2015-10-08 DIAGNOSIS — I509 Heart failure, unspecified: Secondary | ICD-10-CM | POA: Diagnosis not present

## 2015-10-08 DIAGNOSIS — L89312 Pressure ulcer of right buttock, stage 2: Secondary | ICD-10-CM | POA: Diagnosis not present

## 2015-10-15 ENCOUNTER — Other Ambulatory Visit: Payer: Self-pay | Admitting: Family Medicine

## 2015-10-15 DIAGNOSIS — I509 Heart failure, unspecified: Secondary | ICD-10-CM | POA: Diagnosis not present

## 2015-10-15 DIAGNOSIS — L89312 Pressure ulcer of right buttock, stage 2: Secondary | ICD-10-CM | POA: Diagnosis not present

## 2015-10-15 DIAGNOSIS — I110 Hypertensive heart disease with heart failure: Secondary | ICD-10-CM | POA: Diagnosis not present

## 2015-10-15 DIAGNOSIS — Q059 Spina bifida, unspecified: Secondary | ICD-10-CM | POA: Diagnosis not present

## 2015-10-15 DIAGNOSIS — L89322 Pressure ulcer of left buttock, stage 2: Secondary | ICD-10-CM | POA: Diagnosis not present

## 2015-10-15 DIAGNOSIS — B353 Tinea pedis: Secondary | ICD-10-CM | POA: Diagnosis not present

## 2015-10-17 DIAGNOSIS — H353221 Exudative age-related macular degeneration, left eye, with active choroidal neovascularization: Secondary | ICD-10-CM | POA: Diagnosis not present

## 2015-10-20 ENCOUNTER — Other Ambulatory Visit: Payer: Self-pay | Admitting: Family Medicine

## 2015-10-22 DIAGNOSIS — L89322 Pressure ulcer of left buttock, stage 2: Secondary | ICD-10-CM | POA: Diagnosis not present

## 2015-10-22 DIAGNOSIS — I509 Heart failure, unspecified: Secondary | ICD-10-CM | POA: Diagnosis not present

## 2015-10-22 DIAGNOSIS — Q059 Spina bifida, unspecified: Secondary | ICD-10-CM | POA: Diagnosis not present

## 2015-10-22 DIAGNOSIS — B353 Tinea pedis: Secondary | ICD-10-CM | POA: Diagnosis not present

## 2015-10-22 DIAGNOSIS — I110 Hypertensive heart disease with heart failure: Secondary | ICD-10-CM | POA: Diagnosis not present

## 2015-10-22 DIAGNOSIS — L89312 Pressure ulcer of right buttock, stage 2: Secondary | ICD-10-CM | POA: Diagnosis not present

## 2015-10-26 ENCOUNTER — Other Ambulatory Visit: Payer: Self-pay | Admitting: Family Medicine

## 2015-10-31 ENCOUNTER — Ambulatory Visit: Payer: Medicare Other | Admitting: Family Medicine

## 2015-11-05 ENCOUNTER — Ambulatory Visit: Payer: Medicare Other | Admitting: Family Medicine

## 2015-11-05 ENCOUNTER — Encounter (HOSPITAL_BASED_OUTPATIENT_CLINIC_OR_DEPARTMENT_OTHER): Payer: Medicare Other | Attending: Internal Medicine

## 2015-11-05 DIAGNOSIS — I110 Hypertensive heart disease with heart failure: Secondary | ICD-10-CM | POA: Diagnosis not present

## 2015-11-05 DIAGNOSIS — E119 Type 2 diabetes mellitus without complications: Secondary | ICD-10-CM | POA: Diagnosis not present

## 2015-11-05 DIAGNOSIS — I2510 Atherosclerotic heart disease of native coronary artery without angina pectoris: Secondary | ICD-10-CM | POA: Insufficient documentation

## 2015-11-05 DIAGNOSIS — M069 Rheumatoid arthritis, unspecified: Secondary | ICD-10-CM | POA: Insufficient documentation

## 2015-11-05 DIAGNOSIS — I509 Heart failure, unspecified: Secondary | ICD-10-CM | POA: Insufficient documentation

## 2015-11-05 DIAGNOSIS — L89322 Pressure ulcer of left buttock, stage 2: Secondary | ICD-10-CM | POA: Diagnosis not present

## 2015-11-05 DIAGNOSIS — L89312 Pressure ulcer of right buttock, stage 2: Secondary | ICD-10-CM | POA: Diagnosis not present

## 2015-11-12 DIAGNOSIS — E119 Type 2 diabetes mellitus without complications: Secondary | ICD-10-CM | POA: Diagnosis not present

## 2015-11-12 DIAGNOSIS — I110 Hypertensive heart disease with heart failure: Secondary | ICD-10-CM | POA: Diagnosis not present

## 2015-11-12 DIAGNOSIS — M069 Rheumatoid arthritis, unspecified: Secondary | ICD-10-CM | POA: Diagnosis not present

## 2015-11-12 DIAGNOSIS — L89322 Pressure ulcer of left buttock, stage 2: Secondary | ICD-10-CM | POA: Diagnosis not present

## 2015-11-12 DIAGNOSIS — I2510 Atherosclerotic heart disease of native coronary artery without angina pectoris: Secondary | ICD-10-CM | POA: Diagnosis not present

## 2015-11-12 DIAGNOSIS — I509 Heart failure, unspecified: Secondary | ICD-10-CM | POA: Diagnosis not present

## 2015-11-12 DIAGNOSIS — L89312 Pressure ulcer of right buttock, stage 2: Secondary | ICD-10-CM | POA: Diagnosis not present

## 2015-11-13 DIAGNOSIS — L84 Corns and callosities: Secondary | ICD-10-CM | POA: Diagnosis not present

## 2015-11-13 DIAGNOSIS — E1151 Type 2 diabetes mellitus with diabetic peripheral angiopathy without gangrene: Secondary | ICD-10-CM | POA: Diagnosis not present

## 2015-11-13 DIAGNOSIS — B351 Tinea unguium: Secondary | ICD-10-CM | POA: Diagnosis not present

## 2015-11-29 DIAGNOSIS — H353221 Exudative age-related macular degeneration, left eye, with active choroidal neovascularization: Secondary | ICD-10-CM | POA: Diagnosis not present

## 2015-12-03 ENCOUNTER — Encounter (HOSPITAL_BASED_OUTPATIENT_CLINIC_OR_DEPARTMENT_OTHER): Payer: Medicare Other | Attending: Internal Medicine

## 2015-12-03 DIAGNOSIS — L89322 Pressure ulcer of left buttock, stage 2: Secondary | ICD-10-CM | POA: Insufficient documentation

## 2015-12-03 DIAGNOSIS — E119 Type 2 diabetes mellitus without complications: Secondary | ICD-10-CM | POA: Diagnosis not present

## 2015-12-03 DIAGNOSIS — L89312 Pressure ulcer of right buttock, stage 2: Secondary | ICD-10-CM | POA: Insufficient documentation

## 2015-12-03 DIAGNOSIS — I110 Hypertensive heart disease with heart failure: Secondary | ICD-10-CM | POA: Diagnosis not present

## 2015-12-03 DIAGNOSIS — Q059 Spina bifida, unspecified: Secondary | ICD-10-CM | POA: Insufficient documentation

## 2015-12-03 DIAGNOSIS — I509 Heart failure, unspecified: Secondary | ICD-10-CM | POA: Insufficient documentation

## 2015-12-03 DIAGNOSIS — B353 Tinea pedis: Secondary | ICD-10-CM | POA: Diagnosis not present

## 2015-12-03 DIAGNOSIS — B354 Tinea corporis: Secondary | ICD-10-CM | POA: Diagnosis not present

## 2015-12-03 DIAGNOSIS — M069 Rheumatoid arthritis, unspecified: Secondary | ICD-10-CM | POA: Insufficient documentation

## 2015-12-03 DIAGNOSIS — I2510 Atherosclerotic heart disease of native coronary artery without angina pectoris: Secondary | ICD-10-CM | POA: Diagnosis not present

## 2015-12-10 DIAGNOSIS — L89312 Pressure ulcer of right buttock, stage 2: Secondary | ICD-10-CM | POA: Diagnosis not present

## 2015-12-10 DIAGNOSIS — L89322 Pressure ulcer of left buttock, stage 2: Secondary | ICD-10-CM | POA: Diagnosis not present

## 2015-12-10 DIAGNOSIS — Q059 Spina bifida, unspecified: Secondary | ICD-10-CM | POA: Diagnosis not present

## 2015-12-10 DIAGNOSIS — B353 Tinea pedis: Secondary | ICD-10-CM | POA: Diagnosis not present

## 2015-12-10 DIAGNOSIS — I2510 Atherosclerotic heart disease of native coronary artery without angina pectoris: Secondary | ICD-10-CM | POA: Diagnosis not present

## 2015-12-10 DIAGNOSIS — I110 Hypertensive heart disease with heart failure: Secondary | ICD-10-CM | POA: Diagnosis not present

## 2015-12-13 ENCOUNTER — Ambulatory Visit (INDEPENDENT_AMBULATORY_CARE_PROVIDER_SITE_OTHER): Payer: Medicare Other | Admitting: Family Medicine

## 2015-12-13 ENCOUNTER — Encounter: Payer: Self-pay | Admitting: Family Medicine

## 2015-12-13 VITALS — BP 133/79 | HR 79 | Temp 97.3°F | Ht 68.0 in | Wt 210.0 lb

## 2015-12-13 DIAGNOSIS — K5909 Other constipation: Secondary | ICD-10-CM

## 2015-12-13 DIAGNOSIS — N390 Urinary tract infection, site not specified: Secondary | ICD-10-CM | POA: Diagnosis not present

## 2015-12-13 DIAGNOSIS — Q057 Lumbar spina bifida without hydrocephalus: Secondary | ICD-10-CM

## 2015-12-13 DIAGNOSIS — E559 Vitamin D deficiency, unspecified: Secondary | ICD-10-CM

## 2015-12-13 DIAGNOSIS — E875 Hyperkalemia: Secondary | ICD-10-CM | POA: Diagnosis not present

## 2015-12-13 DIAGNOSIS — I10 Essential (primary) hypertension: Secondary | ICD-10-CM | POA: Diagnosis not present

## 2015-12-13 DIAGNOSIS — I739 Peripheral vascular disease, unspecified: Secondary | ICD-10-CM | POA: Diagnosis not present

## 2015-12-13 DIAGNOSIS — Z23 Encounter for immunization: Secondary | ICD-10-CM | POA: Diagnosis not present

## 2015-12-13 DIAGNOSIS — E7800 Pure hypercholesterolemia, unspecified: Secondary | ICD-10-CM

## 2015-12-13 DIAGNOSIS — E1121 Type 2 diabetes mellitus with diabetic nephropathy: Secondary | ICD-10-CM

## 2015-12-13 DIAGNOSIS — I6523 Occlusion and stenosis of bilateral carotid arteries: Secondary | ICD-10-CM

## 2015-12-13 DIAGNOSIS — Z87440 Personal history of urinary (tract) infections: Secondary | ICD-10-CM

## 2015-12-13 LAB — MICROSCOPIC EXAMINATION
BACTERIA UA: NONE SEEN
RBC, UA: NONE SEEN /hpf (ref 0–?)

## 2015-12-13 LAB — URINALYSIS, COMPLETE
BILIRUBIN UA: NEGATIVE
Glucose, UA: NEGATIVE
Ketones, UA: NEGATIVE
Nitrite, UA: NEGATIVE
PH UA: 7 (ref 5.0–7.5)
PROTEIN UA: NEGATIVE
RBC UA: NEGATIVE
Specific Gravity, UA: 1.015 (ref 1.005–1.030)
UUROB: 0.2 mg/dL (ref 0.2–1.0)

## 2015-12-13 LAB — BAYER DCA HB A1C WAIVED: HB A1C (BAYER DCA - WAIVED): 7 % — ABNORMAL HIGH (ref <7.0)

## 2015-12-13 MED ORDER — POLYETHYLENE GLYCOL 3350 17 GM/SCOOP PO POWD: 17.0000 g | Freq: Two times a day (BID) | ORAL | 3 refills | Status: DC | PRN

## 2015-12-13 NOTE — Patient Instructions (Addendum)
Medicare Annual Wellness Visit  [facility] and the medical providers at [facility] strive to bring you the best medical care.  In doing so we not only want to address your current medical conditions and concerns but also to detect new conditions early and prevent illness, disease and health-related problems.    Medicare offers a yearly Wellness Visit which allows our clinical staff to assess your need for preventative services including immunizations, lifestyle education, counseling to decrease risk of preventable diseases and screening for fall risk and other medical concerns.    This visit is provided free of charge (no copay) for all Medicare recipients. The clinical pharmacists at [facility] have begun to conduct these Wellness Visits which will also include a thorough review of all your medications.    As you primary medical provider recommend that you make an appointment for your Annual Wellness Visit if you have not done so already this year.  You may set up this appointment before you leave today or you may call back ([phone]) and schedule an appointment.  Please make sure when you call that you mention that you are scheduling your Annual Wellness Visit with the clinical pharmacist so that the appointment may be made for the proper length of time.     Continue current medications. Continue good therapeutic lifestyle changes which include good diet and exercise. Fall precautions discussed with patient. If an FOBT was given today- please return it to our front desk. If you are over 47 years old - you may need Prevnar 39 or the adult Pneumonia vaccine.  **Flu shots are available--- please call and schedule a FLU-CLINIC appointment**  After your visit with [place] today you will receive a survey in the mail or online from [employer] regarding your care with [place]. Please take a moment to fill this out. Your feedback is very  important to [place] as you can help [place] better understand your patient needs as well as improve your experience and satisfaction. WE CARE ABOUT YOU!!!   Continue to check blood sugars regularly Continue to be careful and do not put yourself at risk for falling The flu shot that you received today may make your arm sore Keep follow-up appointment with cardiology

## 2015-12-13 NOTE — Progress Notes (Signed)
Subjective:    Patient ID: [name]., male    DOB: 27-Jul-1930, 82 y.o.   MRN: [id number]  HPI Pt here for follow up and management of chronic medical problems which includes diabetes, hyperlipidemia and hypertension. He is taking medications regularly.The patient is doing well overall. He has some questions about his blood sugar medicines and he is also requesting a refill on MiraLAX. He still has his sacral decubiti and we will go ahead today and do a rectal exam to be complete with the exam. He is due to get lab work today he will get his flu shot today. The patient is followed regularly by the cardiologist. The patient indicates that he had a fall late in the evening on September 24, the patient was getting out of the bed or arising from a chair. He had his walker but he fell between the walker his legs got weak and he fell on his knees and had a contusion of his right forearm and strained his right shoulder. EMS came to the home for a visit and his vital signs were stable. His blood sugar was 163 at the time. They offered to take him to the hospital but he said he did not have to go because he was feeling okay. This is the only episode like this that he has ever had. He is seeing the eye doctor regularly and he has macular degeneration is is getting shots in the left eye monthly. His blood sugars at home and been running anywhere from 70-114 fasting. He sees a cardiologist regularly and denies any chest pain or shortness of breath. His bowels are moving as usual but he does take MiraLAX to keep them moving and sometimes they're a little bit more loose than usual. He still has some urinary incontinence but overall he is doing well with that. The patient is pleasant and alert. He is assisted with his care and spicy taking care of his decubiti by the [facility] Sidle and by his wife at home.     Patient Active Problem List   Diagnosis Date Noted  . Leukocytosis 07/30/2015  .  Decubitus ulcer, buttock 10/31/2014  . Chronic diastolic CHF (congestive heart failure) ([facility]) 09/22/2013  . Constipation 07/05/2013  . Cellulitis 01/19/2013  . Abscess or cellulitis of gluteal region 01/19/2013  . UTI (lower urinary tract infection) 01/19/2013  . Urinary incontinence 12/13/2012  . External hemorrhoids with complication 73/71/0626  . Chronic constipation 11/29/2012  . Metabolic syndrome 94/85/4627  . Hemorrhoid prolapse 11/29/2012  . Spina bifida ([facility]) 11/18/2012  . CAD, NATIVE VESSEL 03/08/2009  . Type 2 diabetes mellitus with diabetic nephropathy ([place]) 10/29/2008  . MALNUTRITION, PROTEIN-CALORIE 10/29/2008  . Type 2 diabetes mellitus with hyperlipidemia ([place]) 10/29/2008  . HYPOKALEMIA 10/29/2008  . ANEMIA 10/29/2008  . PTSD 10/29/2008  . Essential hypertension 10/29/2008  . Chronic kidney disease 10/29/2008   Outpatient Encounter Prescriptions as of 12/13/2015  Medication Sig  . aspirin 81 MG tablet Take 81 mg by mouth daily.    Marland Kitchen buPROPion (WELLBUTRIN XL) 300 MG 24 hr tablet Take 1 tablet (300 mg total) by mouth daily.  . Cholecalciferol (VITAMIN D-3) 5000 UNITS TABS Take 1 tablet by mouth daily.    . Coenzyme Q10 (CO Q-10) 100 MG CAPS Take 1 capsule by mouth 2 (two) times daily.   . furosemide (LASIX) 20 MG tablet TAKE 1 TABLET EVERY MORNING (NEED FOLLOW UP WITH DOCTOR PLEASE CALL AND SCHEDULE AN APPOINTMENT FOR  DECEMBER)  . glipiZIDE (GLUCOTROL XL) 10 MG 24 hr tablet TAKE 1 TABLET DAILY  . JANUMET 50-500 MG tablet TAKE 1 TABLET DAILY WITH FOOD  . metoprolol tartrate (LOPRESSOR) 25 MG tablet Take 1 tablet (25 mg total) by mouth 2 (two) times daily.  . Multiple Vitamins-Minerals (PRESERVISION AREDS 2 PO) Take 1 capsule by mouth 2 (two) times daily.  . nitroGLYCERIN (NITROSTAT) 0.4 MG SL tablet Place 1 tablet (0.4 mg total) under the tongue every 5 (five) minutes as needed.  . NON FORMULARY cran-actin 111m 1 tablet daily  . NON FORMULARY Sweetish Bitters Daily    . Omega-3 Fatty Acids (FISH OIL) 1000 MG CAPS Take 1 capsule by mouth 2 (two) times daily.    . polyethylene glycol powder (GLYCOLAX/MIRALAX) powder Take 17 g by mouth 2 (two) times daily as needed.  . sodium bicarbonate 650 MG tablet Take 650 mg by mouth 3 (three) times daily.  .Marland KitchenVYTORIN 10-20 MG tablet TAKE 1 TABLET DAILY AS DIRECTED  . [DISCONTINUED] amoxicillin-clavulanate (AUGMENTIN) 875-125 MG tablet Take 1 tablet by mouth 2 (two) times daily.  . [DISCONTINUED] ciprofloxacin (CIPRO) 250 MG tablet Take 1 tablet (250 mg total) by mouth 2 (two) times daily.  . [DISCONTINUED] collagenase (SANTYL) ointment Apply 1 application topically daily.   No facility-administered encounter medications on file as of 12/13/2015.       Review of Systems  Constitutional: Negative.   HENT: Negative.   Eyes: Negative.   Respiratory: Negative.   Cardiovascular: Negative.   Gastrointestinal: Negative.   Endocrine: Negative.   Genitourinary: Negative.   Musculoskeletal: Negative.   Skin: Negative.   Allergic/Immunologic: Negative.   Neurological: Negative.   Hematological: Negative.   Psychiatric/Behavioral: Negative.        Objective:   Physical Exam  Constitutional: He is oriented to person, place, and time. He appears well-developed and well-nourished.  The patient is pleasant and alert. He uses his rolling walker for ambulation and for sitting.  HENT:  Head: Normocephalic and atraumatic.  Right Ear: External ear normal.  Left Ear: External ear normal.  Nose: Nose normal.  Mouth/Throat: Oropharynx is clear and moist. No oropharyngeal exudate.  Eyes: Conjunctivae and EOM are normal. Pupils are equal, round, and reactive to light. Right eye exhibits no discharge. Left eye exhibits no discharge. No scleral icterus.  Sees the eye doctor regularly.  Neck: Normal range of motion. Neck supple. No tracheal deviation present. No thyromegaly present.  Cardiovascular: Normal rate, regular rhythm  and normal heart sounds.   No murmur heard. Heart is good at 84/m with a regular rate and rhythm.  Pulmonary/Chest: Effort normal and breath sounds normal. No respiratory distress. He has no wheezes. He has no rales.  Abdominal: Soft. Bowel sounds are normal. He exhibits no mass. There is no tenderness. There is no rebound and no guarding.  No liver or spleen enlargement. No masses palpable. No bruits.  Genitourinary: Rectum normal and prostate normal.  Genitourinary Comments: Was no prostate enlargement. The rectal exam was devoid of masses.  Musculoskeletal: He exhibits deformity. He exhibits no edema.  Patient uses a rolling walker because of his spina bifida.  Lymphadenopathy:    He has no cervical adenopathy.  Neurological: He is alert and oriented to person, place, and time. He has normal reflexes.  The patient has diminished sensation on the bottom of the left foot.  Skin: Skin is warm and dry. No rash noted.  The decubiti on both buttocks appear to be healing.  The dressings were not removed from a couple of areas because they were just stress.  Psychiatric: He has a normal mood and affect. His behavior is normal. Judgment and thought content normal.  Nursing note and vitals reviewed.  BP 133/79 (BP Location: Left Arm)   Pulse 79   Temp 97.3 F (36.3 C) (Oral)   Ht _0  (1.727 m)   Wt 210 lb (95.3 kg)   BMI 31.93 kg/m         Assessment & Plan:  1. Type 2 diabetes mellitus with diabetic nephropathy, without long-term current use of insulin ([place]) -The patient indicates that his home blood sugars have been running anywhere from 7214. We will do an A1c here to see what overall control he has. He is watching his diet much more closely and especially with the evening refills. He occasionally has an elevated blood sugar but not as often as he used to and he is losing some weight and watching his diet more closely. - CBC with Differential/Platelet - Bayer DCA Hb A1c Waived  2.  Pure hypercholesterolemia -Continue with aggressive therapeutic lifestyle changes and Vytorin. This is pending results of lab work. - Lipid panel - CBC with Differential/Platelet  3. Vitamin D deficiency -Continue with vitamin D replacement pending results of lab work - CBC with Differential/Platelet - VITAMIN D 25 Hydroxy (Vit-D Deficiency, Fractures)  4. Essential hypertension -Blood pressures are good and he will continue with current treatment - BMP8+EGFR - CBC with Differential/Platelet - Hepatic function panel  5. Chronic constipation - polyethylene glycol powder (GLYCOLAX/MIRALAX) powder; Take 17 g by mouth 2 (two) times daily as needed.  Dispense: 850 g; Refill: 3  6. Recent urinary tract infection -Check urinalysis for follow-up - Urinalysis, Complete - Urine culture  7. Spina bifida of lumbosacral region, unspecified hydrocephalus presence ([place]) -This is been an ongoing problem and we will continue to monitor this.  8. Peripheral vascular insufficiency (HCC) -Left is worse than the right. In the lower extremity.  Meds ordered this encounter  Medications  . polyethylene glycol powder (GLYCOLAX/MIRALAX) powder    Sig: Take 17 g by mouth 2 (two) times daily as needed.    Dispense:  850 g    Refill:  3    Put on file and discontinue all previous refills for this medication.   Patient Instructions                       Medicare Annual Wellness Visit  [facility] and the medical providers at [facility] strive to bring you the best medical care.  In doing so we not only want to address your current medical conditions and concerns but also to detect new conditions early and prevent illness, disease and health-related problems.    Medicare offers a yearly Wellness Visit which allows our clinical staff to assess your need for preventative services including immunizations, lifestyle education, counseling to decrease risk of preventable diseases and  screening for fall risk and other medical concerns.    This visit is provided free of charge (no copay) for all Medicare recipients. The clinical pharmacists at [facility] have begun to conduct these Wellness Visits which will also include a thorough review of all your medications.    As you primary medical provider recommend that you make an appointment for your Annual Wellness Visit if you have not done so already this year.  You may set up this appointment before  you leave today or you may call back ([phone]) and schedule an appointment.  Please make sure when you call that you mention that you are scheduling your Annual Wellness Visit with the clinical pharmacist so that the appointment may be made for the proper length of time.     Continue current medications. Continue good therapeutic lifestyle changes which include good diet and exercise. Fall precautions discussed with patient. If an FOBT was given today- please return it to our front desk. If you are over 45 years old - you may need Prevnar 62 or the adult Pneumonia vaccine.  **Flu shots are available--- please call and schedule a FLU-CLINIC appointment**  After your visit with [place] today you will receive a survey in the mail or online from [employer] regarding your care with [place]. Please take a moment to fill this out. Your feedback is very important to [place] as you can help [place] better understand your patient needs as well as improve your experience and satisfaction. WE CARE ABOUT YOU!!!   Continue to check blood sugars regularly Continue to be careful and do not put yourself at risk for falling The flu shot that you received today may make your arm sore Keep follow-up appointment with cardiology  Arrie Senate MD

## 2015-12-14 LAB — BMP8+EGFR
BUN/Creatinine Ratio: 13 (ref 10–24)
BUN: 25 mg/dL (ref 8–27)
CALCIUM: 9.1 mg/dL (ref 8.6–10.2)
CHLORIDE: 99 mmol/L (ref 96–106)
CO2: 24 mmol/L (ref 18–29)
Creatinine, Ser: 1.87 mg/dL — ABNORMAL HIGH (ref 0.76–1.27)
GFR calc Af Amer: 38 mL/min/{1.73_m2} — ABNORMAL LOW (ref >59)
GFR calc non Af Amer: 33 mL/min/{1.73_m2} — ABNORMAL LOW (ref >59)
GLUCOSE: 129 mg/dL — AB (ref 65–99)
POTASSIUM: 5.5 mmol/L — AB (ref 3.5–5.2)
Sodium: 141 mmol/L (ref 134–144)

## 2015-12-14 LAB — LIPID PANEL
CHOL/HDL RATIO: 2.8 ratio (ref 0.0–5.0)
Cholesterol, Total: 139 mg/dL (ref 100–199)
HDL: 50 mg/dL (ref >39)
LDL CALC: 68 mg/dL (ref 0–99)
Triglycerides: 104 mg/dL (ref 0–149)
VLDL Cholesterol Cal: 21 mg/dL (ref 5–40)

## 2015-12-14 LAB — CBC WITH DIFFERENTIAL/PLATELET
BASOS ABS: 0 10*3/uL (ref 0.0–0.2)
Basos: 0 %
EOS (ABSOLUTE): 0.1 10*3/uL (ref 0.0–0.4)
Eos: 1 %
HEMOGLOBIN: 13.8 g/dL (ref 12.6–17.7)
Hematocrit: 40.8 % (ref 37.5–51.0)
IMMATURE GRANS (ABS): 0 10*3/uL (ref 0.0–0.1)
Immature Granulocytes: 0 %
LYMPHS: 24 %
Lymphocytes Absolute: 2.4 10*3/uL (ref 0.7–3.1)
MCH: 29.9 pg (ref 26.6–33.0)
MCHC: 33.8 g/dL (ref 31.5–35.7)
MCV: 89 fL (ref 79–97)
MONOCYTES: 10 %
Monocytes Absolute: 1 10*3/uL — ABNORMAL HIGH (ref 0.1–0.9)
NEUTROS ABS: 6.3 10*3/uL (ref 1.4–7.0)
Neutrophils: 65 %
PLATELETS: 242 10*3/uL (ref 150–379)
RBC: 4.61 x10E6/uL (ref 4.14–5.80)
RDW: 14.2 % (ref 12.3–15.4)
WBC: 9.8 10*3/uL (ref 3.4–10.8)

## 2015-12-14 LAB — HEPATIC FUNCTION PANEL
ALBUMIN: 3.9 g/dL (ref 3.5–4.7)
ALK PHOS: 83 IU/L (ref 39–117)
ALT: 12 IU/L (ref 0–44)
AST: 15 IU/L (ref 0–40)
Bilirubin Total: 0.4 mg/dL (ref 0.0–1.2)
Bilirubin, Direct: 0.15 mg/dL (ref 0.00–0.40)
TOTAL PROTEIN: 6.6 g/dL (ref 6.0–8.5)

## 2015-12-14 LAB — VITAMIN D 25 HYDROXY (VIT D DEFICIENCY, FRACTURES): Vit D, 25-Hydroxy: 41.3 ng/mL (ref 30.0–100.0)

## 2015-12-14 NOTE — Addendum Note (Signed)
Addended by: Michaela Corner on: 12/14/2015 01:06 PM   Modules accepted: Orders

## 2015-12-15 LAB — URINE CULTURE

## 2015-12-17 DIAGNOSIS — B353 Tinea pedis: Secondary | ICD-10-CM | POA: Diagnosis not present

## 2015-12-17 DIAGNOSIS — I110 Hypertensive heart disease with heart failure: Secondary | ICD-10-CM | POA: Diagnosis not present

## 2015-12-17 DIAGNOSIS — L89322 Pressure ulcer of left buttock, stage 2: Secondary | ICD-10-CM | POA: Diagnosis not present

## 2015-12-17 DIAGNOSIS — B356 Tinea cruris: Secondary | ICD-10-CM | POA: Diagnosis not present

## 2015-12-17 DIAGNOSIS — L89312 Pressure ulcer of right buttock, stage 2: Secondary | ICD-10-CM | POA: Diagnosis not present

## 2015-12-17 DIAGNOSIS — Q059 Spina bifida, unspecified: Secondary | ICD-10-CM | POA: Diagnosis not present

## 2015-12-17 DIAGNOSIS — I2510 Atherosclerotic heart disease of native coronary artery without angina pectoris: Secondary | ICD-10-CM | POA: Diagnosis not present

## 2015-12-27 ENCOUNTER — Other Ambulatory Visit: Payer: Self-pay | Admitting: Family Medicine

## 2015-12-27 DIAGNOSIS — H353221 Exudative age-related macular degeneration, left eye, with active choroidal neovascularization: Secondary | ICD-10-CM | POA: Diagnosis not present

## 2015-12-31 ENCOUNTER — Encounter (HOSPITAL_BASED_OUTPATIENT_CLINIC_OR_DEPARTMENT_OTHER): Payer: Medicare Other | Attending: Internal Medicine

## 2015-12-31 DIAGNOSIS — B356 Tinea cruris: Secondary | ICD-10-CM | POA: Diagnosis not present

## 2015-12-31 DIAGNOSIS — M069 Rheumatoid arthritis, unspecified: Secondary | ICD-10-CM | POA: Diagnosis not present

## 2015-12-31 DIAGNOSIS — L89323 Pressure ulcer of left buttock, stage 3: Secondary | ICD-10-CM | POA: Diagnosis not present

## 2015-12-31 DIAGNOSIS — L89313 Pressure ulcer of right buttock, stage 3: Secondary | ICD-10-CM | POA: Diagnosis not present

## 2015-12-31 DIAGNOSIS — E119 Type 2 diabetes mellitus without complications: Secondary | ICD-10-CM | POA: Insufficient documentation

## 2015-12-31 DIAGNOSIS — I110 Hypertensive heart disease with heart failure: Secondary | ICD-10-CM | POA: Insufficient documentation

## 2015-12-31 DIAGNOSIS — Q059 Spina bifida, unspecified: Secondary | ICD-10-CM | POA: Diagnosis not present

## 2015-12-31 DIAGNOSIS — I2510 Atherosclerotic heart disease of native coronary artery without angina pectoris: Secondary | ICD-10-CM | POA: Insufficient documentation

## 2015-12-31 DIAGNOSIS — L89322 Pressure ulcer of left buttock, stage 2: Secondary | ICD-10-CM | POA: Diagnosis not present

## 2015-12-31 DIAGNOSIS — I509 Heart failure, unspecified: Secondary | ICD-10-CM | POA: Diagnosis not present

## 2016-01-03 ENCOUNTER — Other Ambulatory Visit: Payer: Self-pay | Admitting: Cardiology

## 2016-01-07 DIAGNOSIS — I110 Hypertensive heart disease with heart failure: Secondary | ICD-10-CM | POA: Diagnosis not present

## 2016-01-07 DIAGNOSIS — Q059 Spina bifida, unspecified: Secondary | ICD-10-CM | POA: Diagnosis not present

## 2016-01-07 DIAGNOSIS — L89322 Pressure ulcer of left buttock, stage 2: Secondary | ICD-10-CM | POA: Diagnosis not present

## 2016-01-07 DIAGNOSIS — E119 Type 2 diabetes mellitus without complications: Secondary | ICD-10-CM | POA: Diagnosis not present

## 2016-01-07 DIAGNOSIS — L89312 Pressure ulcer of right buttock, stage 2: Secondary | ICD-10-CM | POA: Diagnosis not present

## 2016-01-07 DIAGNOSIS — L89323 Pressure ulcer of left buttock, stage 3: Secondary | ICD-10-CM | POA: Diagnosis not present

## 2016-01-07 DIAGNOSIS — B356 Tinea cruris: Secondary | ICD-10-CM | POA: Diagnosis not present

## 2016-01-07 DIAGNOSIS — L89313 Pressure ulcer of right buttock, stage 3: Secondary | ICD-10-CM | POA: Diagnosis not present

## 2016-01-13 ENCOUNTER — Other Ambulatory Visit: Payer: Self-pay | Admitting: Family Medicine

## 2016-01-14 DIAGNOSIS — L89313 Pressure ulcer of right buttock, stage 3: Secondary | ICD-10-CM | POA: Diagnosis not present

## 2016-01-14 DIAGNOSIS — I110 Hypertensive heart disease with heart failure: Secondary | ICD-10-CM | POA: Diagnosis not present

## 2016-01-14 DIAGNOSIS — B356 Tinea cruris: Secondary | ICD-10-CM | POA: Diagnosis not present

## 2016-01-14 DIAGNOSIS — E119 Type 2 diabetes mellitus without complications: Secondary | ICD-10-CM | POA: Diagnosis not present

## 2016-01-14 DIAGNOSIS — Q059 Spina bifida, unspecified: Secondary | ICD-10-CM | POA: Diagnosis not present

## 2016-01-14 DIAGNOSIS — L89323 Pressure ulcer of left buttock, stage 3: Secondary | ICD-10-CM | POA: Diagnosis not present

## 2016-01-24 ENCOUNTER — Other Ambulatory Visit: Payer: Self-pay | Admitting: Family Medicine

## 2016-01-24 DIAGNOSIS — H353221 Exudative age-related macular degeneration, left eye, with active choroidal neovascularization: Secondary | ICD-10-CM | POA: Diagnosis not present

## 2016-01-28 ENCOUNTER — Encounter (HOSPITAL_BASED_OUTPATIENT_CLINIC_OR_DEPARTMENT_OTHER): Payer: Medicare Other | Attending: Internal Medicine

## 2016-01-28 DIAGNOSIS — L89319 Pressure ulcer of right buttock, unspecified stage: Secondary | ICD-10-CM | POA: Diagnosis not present

## 2016-01-28 DIAGNOSIS — L89312 Pressure ulcer of right buttock, stage 2: Secondary | ICD-10-CM | POA: Diagnosis not present

## 2016-01-28 DIAGNOSIS — L89313 Pressure ulcer of right buttock, stage 3: Secondary | ICD-10-CM | POA: Diagnosis not present

## 2016-01-28 DIAGNOSIS — L89329 Pressure ulcer of left buttock, unspecified stage: Secondary | ICD-10-CM | POA: Diagnosis not present

## 2016-01-28 DIAGNOSIS — B356 Tinea cruris: Secondary | ICD-10-CM | POA: Diagnosis not present

## 2016-01-28 DIAGNOSIS — Q059 Spina bifida, unspecified: Secondary | ICD-10-CM | POA: Insufficient documentation

## 2016-01-28 DIAGNOSIS — L89323 Pressure ulcer of left buttock, stage 3: Secondary | ICD-10-CM | POA: Diagnosis not present

## 2016-01-28 DIAGNOSIS — L89322 Pressure ulcer of left buttock, stage 2: Secondary | ICD-10-CM | POA: Diagnosis not present

## 2016-01-29 ENCOUNTER — Ambulatory Visit (HOSPITAL_COMMUNITY): Payer: Medicare Other | Admitting: Hematology & Oncology

## 2016-01-29 ENCOUNTER — Other Ambulatory Visit (HOSPITAL_COMMUNITY): Payer: Medicare Other

## 2016-01-31 NOTE — Progress Notes (Signed)
This encounter was created in error - please disregard.  This encounter was created in error - please disregard.

## 2016-02-04 DIAGNOSIS — R809 Proteinuria, unspecified: Secondary | ICD-10-CM | POA: Diagnosis not present

## 2016-02-04 DIAGNOSIS — I10 Essential (primary) hypertension: Secondary | ICD-10-CM | POA: Diagnosis not present

## 2016-02-04 DIAGNOSIS — E559 Vitamin D deficiency, unspecified: Secondary | ICD-10-CM | POA: Diagnosis not present

## 2016-02-04 DIAGNOSIS — Z79899 Other long term (current) drug therapy: Secondary | ICD-10-CM | POA: Diagnosis not present

## 2016-02-04 DIAGNOSIS — N183 Chronic kidney disease, stage 3 (moderate): Secondary | ICD-10-CM | POA: Diagnosis not present

## 2016-02-04 DIAGNOSIS — D509 Iron deficiency anemia, unspecified: Secondary | ICD-10-CM | POA: Diagnosis not present

## 2016-02-05 DIAGNOSIS — E1129 Type 2 diabetes mellitus with other diabetic kidney complication: Secondary | ICD-10-CM | POA: Diagnosis not present

## 2016-02-05 DIAGNOSIS — D649 Anemia, unspecified: Secondary | ICD-10-CM | POA: Diagnosis not present

## 2016-02-05 DIAGNOSIS — N183 Chronic kidney disease, stage 3 (moderate): Secondary | ICD-10-CM | POA: Diagnosis not present

## 2016-02-05 DIAGNOSIS — E872 Acidosis: Secondary | ICD-10-CM | POA: Diagnosis not present

## 2016-02-11 DIAGNOSIS — L89312 Pressure ulcer of right buttock, stage 2: Secondary | ICD-10-CM | POA: Diagnosis not present

## 2016-02-11 DIAGNOSIS — L89322 Pressure ulcer of left buttock, stage 2: Secondary | ICD-10-CM | POA: Diagnosis not present

## 2016-02-11 DIAGNOSIS — B356 Tinea cruris: Secondary | ICD-10-CM | POA: Diagnosis not present

## 2016-02-11 DIAGNOSIS — L89323 Pressure ulcer of left buttock, stage 3: Secondary | ICD-10-CM | POA: Diagnosis not present

## 2016-02-11 DIAGNOSIS — Q059 Spina bifida, unspecified: Secondary | ICD-10-CM | POA: Diagnosis not present

## 2016-02-11 DIAGNOSIS — L89313 Pressure ulcer of right buttock, stage 3: Secondary | ICD-10-CM | POA: Diagnosis not present

## 2016-02-12 DIAGNOSIS — B351 Tinea unguium: Secondary | ICD-10-CM | POA: Diagnosis not present

## 2016-02-12 DIAGNOSIS — L84 Corns and callosities: Secondary | ICD-10-CM | POA: Diagnosis not present

## 2016-02-12 DIAGNOSIS — E1151 Type 2 diabetes mellitus with diabetic peripheral angiopathy without gangrene: Secondary | ICD-10-CM | POA: Diagnosis not present

## 2016-02-21 DIAGNOSIS — H353221 Exudative age-related macular degeneration, left eye, with active choroidal neovascularization: Secondary | ICD-10-CM | POA: Diagnosis not present

## 2016-03-03 ENCOUNTER — Encounter (HOSPITAL_BASED_OUTPATIENT_CLINIC_OR_DEPARTMENT_OTHER): Payer: Medicare Other | Attending: Internal Medicine

## 2016-03-03 ENCOUNTER — Encounter (HOSPITAL_BASED_OUTPATIENT_CLINIC_OR_DEPARTMENT_OTHER): Payer: Self-pay

## 2016-03-03 DIAGNOSIS — I2510 Atherosclerotic heart disease of native coronary artery without angina pectoris: Secondary | ICD-10-CM | POA: Insufficient documentation

## 2016-03-03 DIAGNOSIS — Q059 Spina bifida, unspecified: Secondary | ICD-10-CM | POA: Insufficient documentation

## 2016-03-03 DIAGNOSIS — I10 Essential (primary) hypertension: Secondary | ICD-10-CM | POA: Insufficient documentation

## 2016-03-03 DIAGNOSIS — E119 Type 2 diabetes mellitus without complications: Secondary | ICD-10-CM | POA: Insufficient documentation

## 2016-03-03 DIAGNOSIS — L89322 Pressure ulcer of left buttock, stage 2: Secondary | ICD-10-CM | POA: Insufficient documentation

## 2016-03-10 DIAGNOSIS — L89323 Pressure ulcer of left buttock, stage 3: Secondary | ICD-10-CM | POA: Diagnosis not present

## 2016-03-10 DIAGNOSIS — Q059 Spina bifida, unspecified: Secondary | ICD-10-CM | POA: Diagnosis not present

## 2016-03-10 DIAGNOSIS — L89322 Pressure ulcer of left buttock, stage 2: Secondary | ICD-10-CM | POA: Diagnosis not present

## 2016-03-10 DIAGNOSIS — L89313 Pressure ulcer of right buttock, stage 3: Secondary | ICD-10-CM | POA: Diagnosis not present

## 2016-03-10 DIAGNOSIS — I2510 Atherosclerotic heart disease of native coronary artery without angina pectoris: Secondary | ICD-10-CM | POA: Diagnosis not present

## 2016-03-10 DIAGNOSIS — E119 Type 2 diabetes mellitus without complications: Secondary | ICD-10-CM | POA: Diagnosis not present

## 2016-03-10 DIAGNOSIS — I10 Essential (primary) hypertension: Secondary | ICD-10-CM | POA: Diagnosis not present

## 2016-03-20 DIAGNOSIS — H353221 Exudative age-related macular degeneration, left eye, with active choroidal neovascularization: Secondary | ICD-10-CM | POA: Diagnosis not present

## 2016-03-31 ENCOUNTER — Encounter (HOSPITAL_BASED_OUTPATIENT_CLINIC_OR_DEPARTMENT_OTHER): Payer: Medicare Other | Attending: Internal Medicine

## 2016-03-31 DIAGNOSIS — L89312 Pressure ulcer of right buttock, stage 2: Secondary | ICD-10-CM | POA: Insufficient documentation

## 2016-03-31 DIAGNOSIS — I2510 Atherosclerotic heart disease of native coronary artery without angina pectoris: Secondary | ICD-10-CM | POA: Insufficient documentation

## 2016-03-31 DIAGNOSIS — E119 Type 2 diabetes mellitus without complications: Secondary | ICD-10-CM | POA: Insufficient documentation

## 2016-03-31 DIAGNOSIS — Q059 Spina bifida, unspecified: Secondary | ICD-10-CM | POA: Insufficient documentation

## 2016-03-31 DIAGNOSIS — L89322 Pressure ulcer of left buttock, stage 2: Secondary | ICD-10-CM | POA: Diagnosis not present

## 2016-03-31 DIAGNOSIS — I10 Essential (primary) hypertension: Secondary | ICD-10-CM | POA: Diagnosis not present

## 2016-04-16 DIAGNOSIS — H353221 Exudative age-related macular degeneration, left eye, with active choroidal neovascularization: Secondary | ICD-10-CM | POA: Diagnosis not present

## 2016-04-21 DIAGNOSIS — E119 Type 2 diabetes mellitus without complications: Secondary | ICD-10-CM | POA: Diagnosis not present

## 2016-04-21 DIAGNOSIS — L89319 Pressure ulcer of right buttock, unspecified stage: Secondary | ICD-10-CM | POA: Diagnosis not present

## 2016-04-21 DIAGNOSIS — L89312 Pressure ulcer of right buttock, stage 2: Secondary | ICD-10-CM | POA: Diagnosis not present

## 2016-04-21 DIAGNOSIS — L89322 Pressure ulcer of left buttock, stage 2: Secondary | ICD-10-CM | POA: Diagnosis not present

## 2016-04-21 DIAGNOSIS — L89323 Pressure ulcer of left buttock, stage 3: Secondary | ICD-10-CM | POA: Diagnosis not present

## 2016-04-21 DIAGNOSIS — I10 Essential (primary) hypertension: Secondary | ICD-10-CM | POA: Diagnosis not present

## 2016-04-21 DIAGNOSIS — I2510 Atherosclerotic heart disease of native coronary artery without angina pectoris: Secondary | ICD-10-CM | POA: Diagnosis not present

## 2016-04-21 DIAGNOSIS — Q059 Spina bifida, unspecified: Secondary | ICD-10-CM | POA: Diagnosis not present

## 2016-04-29 ENCOUNTER — Encounter: Payer: Self-pay | Admitting: Cardiology

## 2016-04-30 DIAGNOSIS — N183 Chronic kidney disease, stage 3 (moderate): Secondary | ICD-10-CM | POA: Diagnosis not present

## 2016-04-30 DIAGNOSIS — R809 Proteinuria, unspecified: Secondary | ICD-10-CM | POA: Diagnosis not present

## 2016-04-30 DIAGNOSIS — Z79899 Other long term (current) drug therapy: Secondary | ICD-10-CM | POA: Diagnosis not present

## 2016-04-30 DIAGNOSIS — D509 Iron deficiency anemia, unspecified: Secondary | ICD-10-CM | POA: Diagnosis not present

## 2016-04-30 DIAGNOSIS — I10 Essential (primary) hypertension: Secondary | ICD-10-CM | POA: Diagnosis not present

## 2016-04-30 DIAGNOSIS — E559 Vitamin D deficiency, unspecified: Secondary | ICD-10-CM | POA: Diagnosis not present

## 2016-05-05 ENCOUNTER — Encounter (HOSPITAL_BASED_OUTPATIENT_CLINIC_OR_DEPARTMENT_OTHER): Payer: Medicare Other | Attending: Internal Medicine

## 2016-05-05 DIAGNOSIS — I10 Essential (primary) hypertension: Secondary | ICD-10-CM | POA: Insufficient documentation

## 2016-05-05 DIAGNOSIS — L89312 Pressure ulcer of right buttock, stage 2: Secondary | ICD-10-CM | POA: Insufficient documentation

## 2016-05-05 DIAGNOSIS — L89322 Pressure ulcer of left buttock, stage 2: Secondary | ICD-10-CM | POA: Insufficient documentation

## 2016-05-05 DIAGNOSIS — E119 Type 2 diabetes mellitus without complications: Secondary | ICD-10-CM | POA: Insufficient documentation

## 2016-05-05 DIAGNOSIS — I2510 Atherosclerotic heart disease of native coronary artery without angina pectoris: Secondary | ICD-10-CM | POA: Insufficient documentation

## 2016-05-06 DIAGNOSIS — R809 Proteinuria, unspecified: Secondary | ICD-10-CM | POA: Diagnosis not present

## 2016-05-06 DIAGNOSIS — D649 Anemia, unspecified: Secondary | ICD-10-CM | POA: Diagnosis not present

## 2016-05-06 DIAGNOSIS — E1129 Type 2 diabetes mellitus with other diabetic kidney complication: Secondary | ICD-10-CM | POA: Diagnosis not present

## 2016-05-06 DIAGNOSIS — N183 Chronic kidney disease, stage 3 (moderate): Secondary | ICD-10-CM | POA: Diagnosis not present

## 2016-05-07 ENCOUNTER — Ambulatory Visit (INDEPENDENT_AMBULATORY_CARE_PROVIDER_SITE_OTHER): Payer: Medicare Other | Admitting: Cardiology

## 2016-05-07 ENCOUNTER — Encounter: Payer: Self-pay | Admitting: Cardiology

## 2016-05-07 VITALS — BP 126/70 | HR 82 | Ht 68.0 in | Wt 177.0 lb

## 2016-05-07 DIAGNOSIS — I6523 Occlusion and stenosis of bilateral carotid arteries: Secondary | ICD-10-CM | POA: Diagnosis not present

## 2016-05-07 DIAGNOSIS — I2510 Atherosclerotic heart disease of native coronary artery without angina pectoris: Secondary | ICD-10-CM | POA: Diagnosis not present

## 2016-05-07 DIAGNOSIS — N183 Chronic kidney disease, stage 3 unspecified: Secondary | ICD-10-CM

## 2016-05-07 DIAGNOSIS — I10 Essential (primary) hypertension: Secondary | ICD-10-CM

## 2016-05-07 DIAGNOSIS — I5032 Chronic diastolic (congestive) heart failure: Secondary | ICD-10-CM | POA: Diagnosis not present

## 2016-05-07 NOTE — Patient Instructions (Signed)

## 2016-05-07 NOTE — Progress Notes (Signed)
Cardiology Office Note    Date:  05/07/2016   ID:  [name]., DOB [birth year], MRN [id number]  PCP:  Redge Gainer, MD  Cardiologist:   Candee Furbish, MD     History of Present Illness:  Bobby Hank. is a 83 y.o. male with history of coronary artery disease status post CABG with chronic diastolic heart failure here for follow-up. Prior patient of Dr. Claris Gladden. Echocardiogram in January 2015 showed EF of 50-55% with mildly decreased RV systolic function.  He has had prior gluteal pressure ulcers. Wound care.  He also has chronic kidney disease stage 3stage IV with creatinines ranging from 1.4-1.8.  He was praising his doctors that of taking care of him over the years. He has done quite well. He was in the [employer]. As a child he had mild spinal bifida. He denies any chest pain, syncope, bleeding, orthopnea, PND. He still battles with gluteal wound infections.  PMH: 1. Mild case of spina bifida - [facility] research - [facility] [facility] operated on him.  2. Type II diabetes 3. Hyperlipidemia 4. HTN 5. CKD 6. Anemia 7. PTSD 8. CAD: CABG 7/98 (Dr. Redmond Pulling).  Last Myoview in 2/07 showed EF 63%, no ischemia.  9. Gluteal decubitus ulcers 10. Obesity 11. Carotid stenosis: Carotid dopplers (5/13) with 40-59% bilateral ICA stenosis.  Carotid dopplers (1/15) with 40-59% bilateral ICA stenosis. Carotid dopplers (1/17) with mild stenosis only.  12. Diastolic CHF: Echo (2/63) with EF 50-55%, normal RV size with mildly decreased systolic function.   SH: Married, lives in [place], retired from [employer], prior smoker.    Past Medical History:  Diagnosis Date  . Agent orange exposure 1966 or 1971  . Anemia   . Anxiety   . Arthritis    "hands and back" (01/20/2013)  . CAD (coronary artery disease)    native vessel  . Carotid artery disease (HCC)    nonobstructive  . Cataract   . Cecal diverticulitis 2008   drained  . Cellulitis, gluteal    bilateral for the past  6 months/notes 01/20/2013  . Chronic lower back pain   . Chronic renal insufficiency   . Depression   . Diabetes mellitus type II   . Exertional shortness of breath   . HTN (hypertension)   . Hyperlipidemia   . Hypokalemia   . Malnutrition ([facility])    protein-calorie  . Myocardial infarction    "silent; before OHS" (01/20/2013)  . PTSD (post-traumatic stress disorder)   . Spina bifida ([place])   . Urinary incontinence     Past Surgical History:  Procedure Laterality Date  . CARDIAC CATHETERIZATION  1998   "couple before my OHS" (01/20/2013)  . CARPAL TUNNEL RELEASE Right 1980's  . CATARACT EXTRACTION W/ INTRAOCULAR LENS  IMPLANT, BILATERAL Bilateral ~ 2010  . CORONARY ARTERY BYPASS GRAFT  1998   "CABG X4" (01/20/2013)  . CYSTECTOMY  2000's   "cytal cyst on my intestines; probed then drained it; hospitalized for 13 days; NPO" (01/20/2013)  . EYE SURGERY    . HEMORRHOID BANDING  ~ 10/2012  . KNEE SURGERY Left 1964   "exploratory; sewed it up w/wire" (01/20/2013)  . PROSTATE SURGERY    . SPINE SURGERY  11/24/1930   "Spina bifida surgery"  . STRABISMUS SURGERY Bilateral 03/29/2015   Procedure: REPAIR STRABISMUS BILATERAL;  Surgeon: Lamonte Sakai, MD;  Location: [facility];  Service: Ophthalmology;  Laterality: Bilateral;    Current Medications: Outpatient Medications  Prior to Visit  Medication Sig Dispense Refill  . aspirin 81 MG tablet Take 81 mg by mouth daily.      . Cholecalciferol (VITAMIN D-3) 5000 UNITS TABS Take 1 tablet by mouth every other day.     . Coenzyme Q10 (CO Q-10) 100 MG CAPS Take 1 capsule by mouth 2 (two) times daily.     . furosemide (LASIX) 20 MG tablet Take 1 tablet (20 mg total) by mouth daily. 90 tablet 0  . glipiZIDE (GLUCOTROL XL) 10 MG 24 hr tablet TAKE 1 TABLET DAILY 90 tablet 1  . JANUMET 50-500 MG tablet TAKE 1 TABLET DAILY WITH FOOD 90 tablet 1  . metoprolol tartrate (LOPRESSOR) 25 MG tablet Take 1 tablet (25 mg total) by mouth 2 (two)  times daily. 180 tablet 3  . Multiple Vitamins-Minerals (PRESERVISION AREDS 2 PO) Take 1 capsule by mouth 2 (two) times daily.    . nitroGLYCERIN (NITROSTAT) 0.4 MG SL tablet Place 1 tablet (0.4 mg total) under the tongue every 5 (five) minutes as needed. 25 tablet 6  . NON FORMULARY cran-actin 100mg  1 tablet daily    . NON FORMULARY Sweetish Bitters Daily as needed    . Omega-3 Fatty Acids (FISH OIL) 1000 MG CAPS Take 1 capsule by mouth 2 (two) times daily.      . polyethylene glycol powder (GLYCOLAX/MIRALAX) powder Take 17 g by mouth 2 (two) times daily as needed. 850 g 3  . sodium bicarbonate 650 MG tablet Take 650 mg by mouth 2 (two) times daily.   4  . VYTORIN 10-20 MG tablet TAKE 1 TABLET DAILY AS DIRECTED 90 tablet 1  . buPROPion (WELLBUTRIN XL) 300 MG 24 hr tablet TAKE 1 TABLET DAILY 90 tablet 0  . buPROPion (WELLBUTRIN XL) 300 MG 24 hr tablet TAKE 1 TABLET EVERY MORNING 90 tablet 0   No facility-administered medications prior to visit.      Allergies:   Aspirin; Sulfonamide derivatives; and Nitrofurantoin   Social History   Social History  . Marital status: Married    Spouse name: N/A  . Number of children: N/A  . Years of education: N/A   Occupational History  . Retired     Corporate treasurer    Social History Main Topics  . Smoking status: Former Smoker    Packs/day: 2.00    Years: 20.00    Types: Cigarettes    Start date: 11/20/1948    Quit date: 08/19/1980  . Smokeless tobacco: Former User    Types: Chew     Comment: 01/20/2013 "quit chewing 20 yr ago"  . Alcohol use No     Comment: 01/20/2013 "quit drinking 08/19/1980"  . Drug use: No  . Sexual activity: Not Currently   Other Topics Concern  . None   Social History Narrative  . None     Family History:  The patient's family history includes Cancer in his sister and sister; Heart attack in his father and mother; Heart disease in his father and mother.   ROS:   Please see the history of present illness.    ROS All  other systems reviewed and are negative.   PHYSICAL EXAM:   VS:  BP 126/70   Pulse 82   Ht 5\' 8"  (1.727 m)   Wt 177 lb (80.3 kg)   BMI 26.91 kg/m    GEN: Well nourished, well developed, in no acute distress  HEENT: normal  Neck: no JVD, carotid bruits, or masses Cardiac: RRR;  no murmurs, rubs, or gallops,no edema  Respiratory:  clear to auscultation bilaterally, normal work of breathing GI: soft, nontender, nondistended, + BS MS: no deformity or atrophy  Skin: warm and dry, no rash Neuro:  Alert and Oriented x 3, Strength and sensation are intact Psych: euthymic mood, full affect  Wt Readings from Last 3 Encounters:  05/07/16 177 lb (80.3 kg)  12/13/15 210 lb (95.3 kg)  07/30/15 218 lb 3.2 oz (99 kg)      Studies/Labs Reviewed:   EKG:  EKG is ordered today.  The ekg ordered today demonstrates 05/07/16-sinus rhythm 81 with nonspecific ST-T wave flattening, poor R-wave progression, old septal infarct personally viewed  Recent Labs: 07/11/2015: Hemoglobin 15.1 12/13/2015: ALT 12; BUN 25; Creatinine, Ser 1.87; Platelets 242; Potassium 5.5; Sodium 141   Lipid Panel    Component Value Date/Time   CHOL 139 12/13/2015 1552   CHOL 130 08/19/2012 1044   TRIG 104 12/13/2015 1552   TRIG 139 03/14/2015 1133   TRIG 114 08/19/2012 1044   HDL 50 12/13/2015 1552   HDL 46 03/14/2015 1133   HDL 47 08/19/2012 1044   CHOLHDL 2.8 12/13/2015 1552   CHOLHDL 3.1 CALC 03/24/2007 1113   VLDL 19 03/24/2007 1113   LDLCALC 68 12/13/2015 1552   LDLCALC 66 07/05/2013 1039   LDLCALC 60 08/19/2012 1044    Additional studies/ records that were reviewed today include:  EKG reviewed, lab work reviewed, prior office note reviewed    ASSESSMENT:    1. Atherosclerosis of native coronary artery of native heart without angina pectoris   2. Essential hypertension   3. Chronic diastolic CHF (congestive heart failure) ([place] [facility])   4. Bilateral carotid artery stenosis   5. Chronic kidney disease, stage  3 (moderate)      PLAN:  In order of problems listed above:  Coronary artery disease status post CABG in 1998 without angina  - Stable, no symptoms. Doing well.  - Continuing with secondary prevention.  - Prior nuclear stress test in 2007 showed no ischemia.  - He has not had a cardiac catheterization since surgery  Carotid stenosis  - Dopplers reviewed-mild, stable. Continue with statin use.  Hyperlipidemia  - Statin use, heart healthy diet  Chronic diastolic heart failure  - EF 55%, Lasix. NYHA class II symptoms. Overall doing well.  CKD3/4 with diabetes  - Dr. Lowanda Foster  - last creat 1.4   Medication Adjustments/Labs and Tests Ordered: Current medicines are reviewed at length with the patient today.  Concerns regarding medicines are outlined above.  Medication changes, Labs and Tests ordered today are listed in the Patient Instructions below. Patient Instructions  Medication Instructions:  The current medical regimen is effective;  continue present plan and medications.  Follow-Up: Follow up in 1 year with Dr. Marlou Porch.  You will receive a letter in the mail 2 months before you are due.  Please call us when you receive this letter to schedule your follow up appointment.  If you need a refill on your cardiac medications before your next appointment, please call your pharmacy.  Thank you for choosing [facility]!!        Signed, Candee Furbish, MD  05/07/2016 3:38 PM    [facility] [place], [place], [place]  [address] Phone: [phone]; Fax: [phone]

## 2016-05-12 DIAGNOSIS — I10 Essential (primary) hypertension: Secondary | ICD-10-CM | POA: Diagnosis not present

## 2016-05-12 DIAGNOSIS — L89323 Pressure ulcer of left buttock, stage 3: Secondary | ICD-10-CM | POA: Diagnosis not present

## 2016-05-12 DIAGNOSIS — L89312 Pressure ulcer of right buttock, stage 2: Secondary | ICD-10-CM | POA: Diagnosis not present

## 2016-05-12 DIAGNOSIS — I2510 Atherosclerotic heart disease of native coronary artery without angina pectoris: Secondary | ICD-10-CM | POA: Diagnosis not present

## 2016-05-12 DIAGNOSIS — L89322 Pressure ulcer of left buttock, stage 2: Secondary | ICD-10-CM | POA: Diagnosis not present

## 2016-05-12 DIAGNOSIS — E119 Type 2 diabetes mellitus without complications: Secondary | ICD-10-CM | POA: Diagnosis not present

## 2016-05-12 DIAGNOSIS — L89313 Pressure ulcer of right buttock, stage 3: Secondary | ICD-10-CM | POA: Diagnosis not present

## 2016-05-13 DIAGNOSIS — B351 Tinea unguium: Secondary | ICD-10-CM | POA: Diagnosis not present

## 2016-05-13 DIAGNOSIS — E1151 Type 2 diabetes mellitus with diabetic peripheral angiopathy without gangrene: Secondary | ICD-10-CM | POA: Diagnosis not present

## 2016-05-13 DIAGNOSIS — L84 Corns and callosities: Secondary | ICD-10-CM | POA: Diagnosis not present

## 2016-05-13 DIAGNOSIS — M79671 Pain in right foot: Secondary | ICD-10-CM | POA: Diagnosis not present

## 2016-05-14 ENCOUNTER — Ambulatory Visit: Payer: Medicare Other | Admitting: Family Medicine

## 2016-05-15 ENCOUNTER — Encounter: Payer: Self-pay | Admitting: Family Medicine

## 2016-05-15 ENCOUNTER — Ambulatory Visit (INDEPENDENT_AMBULATORY_CARE_PROVIDER_SITE_OTHER): Payer: Medicare Other | Admitting: Family Medicine

## 2016-05-15 VITALS — BP 119/75 | HR 79 | Temp 96.9°F | Ht 68.0 in | Wt 215.0 lb

## 2016-05-15 DIAGNOSIS — M62542 Muscle wasting and atrophy, not elsewhere classified, left hand: Secondary | ICD-10-CM | POA: Diagnosis not present

## 2016-05-15 DIAGNOSIS — L570 Actinic keratosis: Secondary | ICD-10-CM

## 2016-05-15 DIAGNOSIS — M25521 Pain in right elbow: Secondary | ICD-10-CM

## 2016-05-15 DIAGNOSIS — R29898 Other symptoms and signs involving the musculoskeletal system: Secondary | ICD-10-CM | POA: Diagnosis not present

## 2016-05-15 DIAGNOSIS — E1121 Type 2 diabetes mellitus with diabetic nephropathy: Secondary | ICD-10-CM | POA: Diagnosis not present

## 2016-05-15 DIAGNOSIS — E559 Vitamin D deficiency, unspecified: Secondary | ICD-10-CM | POA: Diagnosis not present

## 2016-05-15 DIAGNOSIS — E7800 Pure hypercholesterolemia, unspecified: Secondary | ICD-10-CM | POA: Diagnosis not present

## 2016-05-15 DIAGNOSIS — I10 Essential (primary) hypertension: Secondary | ICD-10-CM

## 2016-05-15 DIAGNOSIS — I6523 Occlusion and stenosis of bilateral carotid arteries: Secondary | ICD-10-CM | POA: Diagnosis not present

## 2016-05-15 LAB — BAYER DCA HB A1C WAIVED: HB A1C: 6.7 % (ref <7.0)

## 2016-05-15 NOTE — Patient Instructions (Addendum)
Medicare Annual Wellness Visit  [facility] and the medical providers at [facility] strive to bring you the best medical care.  In doing so we not only want to address your current medical conditions and concerns but also to detect new conditions early and prevent illness, disease and health-related problems.    Medicare offers a yearly Wellness Visit which allows our clinical staff to assess your need for preventative services including immunizations, lifestyle education, counseling to decrease risk of preventable diseases and screening for fall risk and other medical concerns.    This visit is provided free of charge (no copay) for all Medicare recipients. The clinical pharmacists at [facility] have begun to conduct these Wellness Visits which will also include a thorough review of all your medications.    As you primary medical provider recommend that you make an appointment for your Annual Wellness Visit if you have not done so already this year.  You may set up this appointment before you leave today or you may call back ([phone]) and schedule an appointment.  Please make sure when you call that you mention that you are scheduling your Annual Wellness Visit with the clinical pharmacist so that the appointment may be made for the proper length of time.     Continue current medications. Continue good therapeutic lifestyle changes which include good diet and exercise. Fall precautions discussed with patient. If an FOBT was given today- please return it to our front desk. If you are over 47 years old - you may need Prevnar 48 or the adult Pneumonia vaccine.  **Flu shots are available--- please call and schedule a FLU-CLINIC appointment**  After your visit with [place] today you will receive a survey in the mail or online from [employer] regarding your care with [place]. Please take a moment to fill this out. Your feedback is very  important to [place] as you can help [place] better understand your patient needs as well as improve your experience and satisfaction. WE CARE ABOUT YOU!!!   Use the tennis elbow brace on the right forearm as directed just below the elbow. We will schedule you for a visit to have [facility] the testing for the left hand because of weakness and discomfort in that wrist Follow-up with the cardiologist as planned Follow-up with the nephrologist as planned We will also give you a prescription for diabetic shoes Follow-up with wound specialist as planned

## 2016-05-15 NOTE — Progress Notes (Signed)
Subjective:    Patient ID: [name]., male    DOB: January 20, 1931, 83 y.o.   MRN: [id number]  HPI  Pt here for follow up and management of chronic medical problems which includes diabetes and hyperlipidemia. He is taking medication regularly.This patient comes back in today for his regular checkup. He does have some concerns with left wrist pain and right elbow pain and a skin lesion on his for head that is itching. He sees a cardiologist regularly. He will get lab work today. The most significant finding with [name] is his history of spina bifida and his dealing with this. He has chronic diastolic heart failure. He has diabetes. He has urinary incontinence and hyperlipidemia. The patient sees Dr. Gypsy Balsam the cardiologist yearly. He goes to the wound center and sees Dr. Florentina Addison about every 3 weeks for his buttock cellulitis and bedsores. He only sees the urologist if needed. He denies any chest pain or shortness of breath anymore than usual. He continues to be incontinent and nose when he has a urinary tract infection because he has burning. Because of his renal function he also sees Dr. Hinda Lenis. He sees him about every 3 months. He says that his last creatinine was 1.4. He does complain of some itching on his for head and a skin lesion there. We did discuss the importance of using scent free soaps fabric softeners and detergents. The patient's wife is his caregiver at home and looks after him diligently and lovingly.    Patient Active Problem List   Diagnosis Date Noted  . Leukocytosis 07/30/2015  . Decubitus ulcer, buttock 10/31/2014  . Chronic diastolic CHF (congestive heart failure) ([place]) 09/22/2013  . Constipation 07/05/2013  . Cellulitis 01/19/2013  . Abscess or cellulitis of gluteal region 01/19/2013  . UTI (lower urinary tract infection) 01/19/2013  . Urinary incontinence 12/13/2012  . External hemorrhoids with complication 40/98/1191  . Chronic constipation 11/29/2012  .  Metabolic syndrome 47/82/9562  . Hemorrhoid prolapse 11/29/2012  . Spina bifida ([place]) 11/18/2012  . CAD, NATIVE VESSEL 03/08/2009  . Type 2 diabetes mellitus with diabetic nephropathy ([place]) 10/29/2008  . MALNUTRITION, PROTEIN-CALORIE 10/29/2008  . Type 2 diabetes mellitus with hyperlipidemia ([place]) 10/29/2008  . HYPOKALEMIA 10/29/2008  . ANEMIA 10/29/2008  . PTSD 10/29/2008  . Essential hypertension 10/29/2008  . Chronic kidney disease 10/29/2008   Outpatient Encounter Prescriptions as of 05/15/2016  Medication Sig  . aspirin 81 MG tablet Take 81 mg by mouth daily.    Marland Kitchen buPROPion (WELLBUTRIN XL) 300 MG 24 hr tablet Take 300 mg by mouth daily.  . Cholecalciferol (VITAMIN D-3) 5000 UNITS TABS Take 1 tablet by mouth every other day.   . Coenzyme Q10 (CO Q-10) 100 MG CAPS Take 1 capsule by mouth 2 (two) times daily.   Marland Kitchen ezetimibe-simvastatin (VYTORIN) 10-20 MG tablet Take 1 tablet by mouth daily.  . furosemide (LASIX) 20 MG tablet Take 1 tablet (20 mg total) by mouth daily.  Marland Kitchen glipiZIDE (GLUCOTROL XL) 10 MG 24 hr tablet TAKE 1 TABLET DAILY  . JANUMET 50-500 MG tablet TAKE 1 TABLET DAILY WITH FOOD  . metoprolol tartrate (LOPRESSOR) 25 MG tablet Take 1 tablet (25 mg total) by mouth 2 (two) times daily.  . Multiple Vitamins-Minerals (PRESERVISION AREDS 2 PO) Take 1 capsule by mouth 2 (two) times daily.  . nitroGLYCERIN (NITROSTAT) 0.4 MG SL tablet Place 1 tablet (0.4 mg total) under the tongue every 5 (five) minutes as needed.  . NON FORMULARY cran-actin  172m 1 tablet daily  . NON FORMULARY Sweetish Bitters Daily as needed  . Omega-3 Fatty Acids (FISH OIL) 1000 MG CAPS Take 1 capsule by mouth 2 (two) times daily.    . polyethylene glycol powder (GLYCOLAX/MIRALAX) powder Take 17 g by mouth 2 (two) times daily as needed.  . sodium bicarbonate 650 MG tablet Take 650 mg by mouth 2 (two) times daily.   .Marland KitchenVYTORIN 10-20 MG tablet TAKE 1 TABLET DAILY AS DIRECTED   No facility-administered encounter  medications on file as of 05/15/2016.      Review of Systems  Constitutional: Negative.   HENT: Negative.   Eyes: Negative.   Respiratory: Negative.   Cardiovascular: Negative.   Gastrointestinal: Negative.   Endocrine: Negative.   Genitourinary: Negative.   Musculoskeletal: Positive for arthralgias (right elbow pain and left wrist pain).  Skin: Positive for color change (itching and lesion - forehead).  Allergic/Immunologic: Negative.   Neurological: Negative.   Hematological: Negative.   Psychiatric/Behavioral: Negative.        Objective:   Physical Exam  Constitutional: He is oriented to person, place, and time. He appears well-developed and well-nourished. No distress.  The patient is alert and pleasant and dealing with all of his medical issues well with the help of his wife  HENT:  Head: Normocephalic and atraumatic.  Right Ear: External ear normal.  Left Ear: External ear normal.  Nose: Nose normal.  Mouth/Throat: Oropharynx is clear and moist. No oropharyngeal exudate.  Eyes: Conjunctivae and EOM are normal. Pupils are equal, round, and reactive to light. Right eye exhibits no discharge. Left eye exhibits no discharge. No scleral icterus.  Neck: Normal range of motion. Neck supple. No thyromegaly present.  Bruits thyromegaly or anterior cervical adenopathy not present.  Cardiovascular: Normal rate, regular rhythm, normal heart sounds and intact distal pulses.   No murmur heard. Distal pulses are present but weak and hard to palpate. The heart is regular at 72/m  Pulmonary/Chest: Effort normal and breath sounds normal. No respiratory distress. He has no wheezes. He has no rales. He exhibits no tenderness.  Clear anteriorly and posteriorly  Abdominal: Soft. Bowel sounds are normal. He exhibits no mass. There is no tenderness. There is no rebound and no guarding.  No abdominal tenderness masses bruits or organ enlargement.  Musculoskeletal: He exhibits no edema or  tenderness.  The patient using his sitting rolling walker for ambulation.  Lymphadenopathy:    He has no cervical adenopathy.  Neurological: He is alert and oriented to person, place, and time.  Skin: Skin is warm and dry. No rash noted.  Psychiatric: He has a normal mood and affect. His behavior is normal. Judgment and thought content normal.  Nursing note and vitals reviewed.  BP 119/75 (BP Location: Left Arm)   Pulse 79   Temp (!) 96.9 F (36.1 C) (Oral)   Ht _0  (1.727 m)   Wt 215 lb (97.5 kg)   BMI 32.69 kg/m         Assessment & Plan:  1. Type 2 diabetes mellitus with diabetic nephropathy, without long-term current use of insulin (HCC) -The patient will continue to monitor blood sugars at home on a regular basis and we'll keep his feet checked regularly because of lack of sensation and good pulses in both feet. - CBC with Differential/Platelet - BMP8+EGFR - Bayer DCA Hb A1c Waived - Microalbumin / creatinine urine ratio - DME Other see comment  2. Pure hypercholesterolemia -Continue aggressive therapeutic lifestyle  changes and current treatment pending results of lab work - CBC with Differential/Platelet - Lipid panel  3. Vitamin D deficiency -Continue with vitamin D replacement pending results of lab work - CBC with Differential/Platelet - VITAMIN D 25 Hydroxy (Vit-D Deficiency, Fractures)  4. Essential hypertension -Blood pressure is good today and he will continue with current treatment - CBC with Differential/Platelet - BMP8+EGFR - Hepatic function panel  5. Atrophy of muscle of left hand -PNCV testing for carpal tunnel syndrome - Ambulatory referral to Neurology  6. Right elbow pain -Right lateral epicondylitis patient will wear elbow brace to see if this will help this.  7. Left hand weakness -PNCV testing  8. Actinic keratosis -Cryotherapy to lesion on forehead. Patient tolerated procedure well.  Patient Instructions                        Medicare Annual Wellness Visit  [facility] and the medical providers at [facility] strive to bring you the best medical care.  In doing so we not only want to address your current medical conditions and concerns but also to detect new conditions early and prevent illness, disease and health-related problems.    Medicare offers a yearly Wellness Visit which allows our clinical staff to assess your need for preventative services including immunizations, lifestyle education, counseling to decrease risk of preventable diseases and screening for fall risk and other medical concerns.    This visit is provided free of charge (no copay) for all Medicare recipients. The clinical pharmacists at [facility] have begun to conduct these Wellness Visits which will also include a thorough review of all your medications.    As you primary medical provider recommend that you make an appointment for your Annual Wellness Visit if you have not done so already this year.  You may set up this appointment before you leave today or you may call back ([phone]) and schedule an appointment.  Please make sure when you call that you mention that you are scheduling your Annual Wellness Visit with the clinical pharmacist so that the appointment may be made for the proper length of time.     Continue current medications. Continue good therapeutic lifestyle changes which include good diet and exercise. Fall precautions discussed with patient. If an FOBT was given today- please return it to our front desk. If you are over 19 years old - you may need Prevnar 25 or the adult Pneumonia vaccine.  **Flu shots are available--- please call and schedule a FLU-CLINIC appointment**  After your visit with [place] today you will receive a survey in the mail or online from [employer] regarding your care with [place]. Please take a moment to fill this out. Your feedback is very important to [place] as you can  help [place] better understand your patient needs as well as improve your experience and satisfaction. WE CARE ABOUT YOU!!!   Use the tennis elbow brace on the right forearm as directed just below the elbow. We will schedule you for a visit to have [facility] the testing for the left hand because of weakness and discomfort in that wrist Follow-up with the cardiologist as planned Follow-up with the nephrologist as planned We will also give you a prescription for diabetic shoes Follow-up with wound specialist as planned  Arrie Senate MD

## 2016-05-16 LAB — CBC WITH DIFFERENTIAL/PLATELET
BASOS: 0 %
Basophils Absolute: 0 10*3/uL (ref 0.0–0.2)
EOS (ABSOLUTE): 0.1 10*3/uL (ref 0.0–0.4)
EOS: 1 %
HEMATOCRIT: 41.8 % (ref 37.5–51.0)
Hemoglobin: 13.9 g/dL (ref 13.0–17.7)
Immature Grans (Abs): 0 10*3/uL (ref 0.0–0.1)
Immature Granulocytes: 0 %
LYMPHS ABS: 2.6 10*3/uL (ref 0.7–3.1)
Lymphs: 20 %
MCH: 30.3 pg (ref 26.6–33.0)
MCHC: 33.3 g/dL (ref 31.5–35.7)
MCV: 91 fL (ref 79–97)
MONOS ABS: 0.9 10*3/uL (ref 0.1–0.9)
Monocytes: 7 %
NEUTROS ABS: 9 10*3/uL — AB (ref 1.4–7.0)
NEUTROS PCT: 72 %
PLATELETS: 225 10*3/uL (ref 150–379)
RBC: 4.58 x10E6/uL (ref 4.14–5.80)
RDW: 14 % (ref 12.3–15.4)
WBC: 12.7 10*3/uL — ABNORMAL HIGH (ref 3.4–10.8)

## 2016-05-16 LAB — LIPID PANEL
CHOL/HDL RATIO: 3 ratio (ref 0.0–5.0)
Cholesterol, Total: 146 mg/dL (ref 100–199)
HDL: 49 mg/dL (ref >39)
LDL Calculated: 75 mg/dL (ref 0–99)
TRIGLYCERIDES: 111 mg/dL (ref 0–149)
VLDL Cholesterol Cal: 22 mg/dL (ref 5–40)

## 2016-05-16 LAB — VITAMIN D 25 HYDROXY (VIT D DEFICIENCY, FRACTURES): VIT D 25 HYDROXY: 37.2 ng/mL (ref 30.0–100.0)

## 2016-05-16 LAB — BMP8+EGFR
BUN / CREAT RATIO: 17 (ref 10–24)
BUN: 30 mg/dL — ABNORMAL HIGH (ref 8–27)
CHLORIDE: 98 mmol/L (ref 96–106)
CO2: 23 mmol/L (ref 18–29)
Calcium: 8.9 mg/dL (ref 8.6–10.2)
Creatinine, Ser: 1.73 mg/dL — ABNORMAL HIGH (ref 0.76–1.27)
GFR, EST AFRICAN AMERICAN: 41 mL/min/{1.73_m2} — AB (ref >59)
GFR, EST NON AFRICAN AMERICAN: 36 mL/min/{1.73_m2} — AB (ref >59)
Glucose: 207 mg/dL — ABNORMAL HIGH (ref 65–99)
POTASSIUM: 4.7 mmol/L (ref 3.5–5.2)
Sodium: 138 mmol/L (ref 134–144)

## 2016-05-16 LAB — HEPATIC FUNCTION PANEL
ALT: 16 IU/L (ref 0–44)
AST: 19 IU/L (ref 0–40)
Albumin: 4.1 g/dL (ref 3.5–4.7)
Alkaline Phosphatase: 88 IU/L (ref 39–117)
BILIRUBIN TOTAL: 0.4 mg/dL (ref 0.0–1.2)
Bilirubin, Direct: 0.14 mg/dL (ref 0.00–0.40)
Total Protein: 6.3 g/dL (ref 6.0–8.5)

## 2016-05-16 LAB — MICROALBUMIN / CREATININE URINE RATIO
Creatinine, Urine: 101 mg/dL
Microalb/Creat Ratio: 45.2 mg/g{creat} — ABNORMAL HIGH (ref 0.0–30.0)
Microalbumin, Urine: 45.7 ug/mL

## 2016-05-17 ENCOUNTER — Other Ambulatory Visit: Payer: Self-pay | Admitting: Family Medicine

## 2016-05-21 DIAGNOSIS — H353221 Exudative age-related macular degeneration, left eye, with active choroidal neovascularization: Secondary | ICD-10-CM | POA: Diagnosis not present

## 2016-05-26 ENCOUNTER — Encounter (HOSPITAL_BASED_OUTPATIENT_CLINIC_OR_DEPARTMENT_OTHER): Payer: Medicare Other | Attending: Internal Medicine

## 2016-05-26 DIAGNOSIS — I10 Essential (primary) hypertension: Secondary | ICD-10-CM | POA: Insufficient documentation

## 2016-05-26 DIAGNOSIS — L89322 Pressure ulcer of left buttock, stage 2: Secondary | ICD-10-CM | POA: Insufficient documentation

## 2016-05-26 DIAGNOSIS — E119 Type 2 diabetes mellitus without complications: Secondary | ICD-10-CM | POA: Insufficient documentation

## 2016-05-26 DIAGNOSIS — L89312 Pressure ulcer of right buttock, stage 2: Secondary | ICD-10-CM | POA: Insufficient documentation

## 2016-05-26 DIAGNOSIS — Q059 Spina bifida, unspecified: Secondary | ICD-10-CM | POA: Insufficient documentation

## 2016-05-26 DIAGNOSIS — B356 Tinea cruris: Secondary | ICD-10-CM | POA: Insufficient documentation

## 2016-05-26 DIAGNOSIS — M069 Rheumatoid arthritis, unspecified: Secondary | ICD-10-CM | POA: Insufficient documentation

## 2016-05-26 DIAGNOSIS — I2510 Atherosclerotic heart disease of native coronary artery without angina pectoris: Secondary | ICD-10-CM | POA: Insufficient documentation

## 2016-06-02 DIAGNOSIS — L89322 Pressure ulcer of left buttock, stage 2: Secondary | ICD-10-CM | POA: Diagnosis not present

## 2016-06-02 DIAGNOSIS — I2510 Atherosclerotic heart disease of native coronary artery without angina pectoris: Secondary | ICD-10-CM | POA: Diagnosis not present

## 2016-06-02 DIAGNOSIS — L89323 Pressure ulcer of left buttock, stage 3: Secondary | ICD-10-CM | POA: Diagnosis not present

## 2016-06-02 DIAGNOSIS — L89312 Pressure ulcer of right buttock, stage 2: Secondary | ICD-10-CM | POA: Diagnosis not present

## 2016-06-02 DIAGNOSIS — Q059 Spina bifida, unspecified: Secondary | ICD-10-CM | POA: Diagnosis not present

## 2016-06-02 DIAGNOSIS — M069 Rheumatoid arthritis, unspecified: Secondary | ICD-10-CM | POA: Diagnosis not present

## 2016-06-02 DIAGNOSIS — E119 Type 2 diabetes mellitus without complications: Secondary | ICD-10-CM | POA: Diagnosis not present

## 2016-06-02 DIAGNOSIS — L89313 Pressure ulcer of right buttock, stage 3: Secondary | ICD-10-CM | POA: Diagnosis not present

## 2016-06-02 DIAGNOSIS — B356 Tinea cruris: Secondary | ICD-10-CM | POA: Diagnosis not present

## 2016-06-02 DIAGNOSIS — I10 Essential (primary) hypertension: Secondary | ICD-10-CM | POA: Diagnosis not present

## 2016-06-16 DIAGNOSIS — L89312 Pressure ulcer of right buttock, stage 2: Secondary | ICD-10-CM | POA: Diagnosis not present

## 2016-06-16 DIAGNOSIS — Q059 Spina bifida, unspecified: Secondary | ICD-10-CM | POA: Diagnosis not present

## 2016-06-16 DIAGNOSIS — L89322 Pressure ulcer of left buttock, stage 2: Secondary | ICD-10-CM | POA: Diagnosis not present

## 2016-06-16 DIAGNOSIS — L89313 Pressure ulcer of right buttock, stage 3: Secondary | ICD-10-CM | POA: Diagnosis not present

## 2016-06-16 DIAGNOSIS — I10 Essential (primary) hypertension: Secondary | ICD-10-CM | POA: Diagnosis not present

## 2016-06-16 DIAGNOSIS — L89323 Pressure ulcer of left buttock, stage 3: Secondary | ICD-10-CM | POA: Diagnosis not present

## 2016-06-16 DIAGNOSIS — B356 Tinea cruris: Secondary | ICD-10-CM | POA: Diagnosis not present

## 2016-06-16 DIAGNOSIS — I2510 Atherosclerotic heart disease of native coronary artery without angina pectoris: Secondary | ICD-10-CM | POA: Diagnosis not present

## 2016-06-25 ENCOUNTER — Encounter: Payer: Medicare Other | Admitting: Neurology

## 2016-06-25 DIAGNOSIS — H353221 Exudative age-related macular degeneration, left eye, with active choroidal neovascularization: Secondary | ICD-10-CM | POA: Diagnosis not present

## 2016-07-01 ENCOUNTER — Telehealth: Payer: Self-pay | Admitting: Family Medicine

## 2016-07-02 ENCOUNTER — Other Ambulatory Visit: Payer: Self-pay

## 2016-07-02 MED ORDER — FUROSEMIDE 20 MG PO TABS: 20.0000 mg | ORAL_TABLET | Freq: Every day | ORAL | 1 refills | Status: DC

## 2016-07-02 NOTE — Telephone Encounter (Signed)
done

## 2016-07-04 ENCOUNTER — Other Ambulatory Visit: Payer: Self-pay | Admitting: Family Medicine

## 2016-07-07 ENCOUNTER — Encounter: Payer: Medicare Other | Admitting: Neurology

## 2016-07-08 ENCOUNTER — Other Ambulatory Visit: Payer: Self-pay | Admitting: Family Medicine

## 2016-07-08 MED ORDER — METOPROLOL TARTRATE 25 MG PO TABS: 25.0000 mg | ORAL_TABLET | Freq: Two times a day (BID) | ORAL | 0 refills | Status: DC

## 2016-07-14 ENCOUNTER — Encounter (HOSPITAL_BASED_OUTPATIENT_CLINIC_OR_DEPARTMENT_OTHER): Payer: Medicare Other | Attending: Internal Medicine

## 2016-07-14 DIAGNOSIS — L89312 Pressure ulcer of right buttock, stage 2: Secondary | ICD-10-CM | POA: Insufficient documentation

## 2016-07-14 DIAGNOSIS — Q059 Spina bifida, unspecified: Secondary | ICD-10-CM | POA: Insufficient documentation

## 2016-07-14 DIAGNOSIS — I10 Essential (primary) hypertension: Secondary | ICD-10-CM | POA: Insufficient documentation

## 2016-07-14 DIAGNOSIS — L89322 Pressure ulcer of left buttock, stage 2: Secondary | ICD-10-CM | POA: Insufficient documentation

## 2016-07-14 DIAGNOSIS — E119 Type 2 diabetes mellitus without complications: Secondary | ICD-10-CM | POA: Diagnosis not present

## 2016-07-14 DIAGNOSIS — L89313 Pressure ulcer of right buttock, stage 3: Secondary | ICD-10-CM | POA: Diagnosis not present

## 2016-07-14 DIAGNOSIS — I2510 Atherosclerotic heart disease of native coronary artery without angina pectoris: Secondary | ICD-10-CM | POA: Insufficient documentation

## 2016-07-15 ENCOUNTER — Encounter: Payer: Medicare Other | Admitting: Neurology

## 2016-07-16 ENCOUNTER — Encounter: Payer: Self-pay | Admitting: Neurology

## 2016-07-16 ENCOUNTER — Ambulatory Visit (INDEPENDENT_AMBULATORY_CARE_PROVIDER_SITE_OTHER): Payer: Self-pay | Admitting: Neurology

## 2016-07-16 ENCOUNTER — Ambulatory Visit (INDEPENDENT_AMBULATORY_CARE_PROVIDER_SITE_OTHER): Payer: Medicare Other | Admitting: Neurology

## 2016-07-16 DIAGNOSIS — G5603 Carpal tunnel syndrome, bilateral upper limbs: Secondary | ICD-10-CM | POA: Diagnosis not present

## 2016-07-16 DIAGNOSIS — E1121 Type 2 diabetes mellitus with diabetic nephropathy: Secondary | ICD-10-CM

## 2016-07-16 NOTE — Progress Notes (Signed)
This patient may need referral to Dr. Daryll Brod

## 2016-07-16 NOTE — Procedures (Signed)
     HISTORY:  [name] is an 83 year old gentleman with a history of diabetes with a diabetic peripheral neuropathy. The patient has a history of a prior right carpal tunnel syndrome release. He has noted over the last 2 years some numbness in the left hand that has developed. He is being evaluated for a possible neuropathy or a cervical radiculopathy.  NERVE CONDUCTION STUDIES:  Nerve conduction studies were performed on both upper extremities. The distal motor latencies for the median nerves were borderline normal on the left and prolonged on the right with low motor amplitudes for these nerves bilaterally. The distal motor latencies for the ulnar nerves were within normal limits bilaterally with normal motor amplitudes bilaterally. There is slowing seen for the left median nerve and for the ulnar nerves bilaterally above the elbows, normal below the elbows. The nerve conduction velocities for the right median nerve were normal. The sensory latencies for the median nerves were unobtainable on the left, and prolonged on the right. The sensory latencies for the ulnar nerves were normal bilaterally. The F wave latencies for the ulnar nerves were normal on the left and prolonged on the right.  EMG STUDIES:  EMG study was performed on the left upper extremity:  The first dorsal interosseous muscle reveals 2 to 4 K units with full recruitment. No fibrillations or positive waves were noted. The abductor pollicis brevis muscle reveals no voluntary motor units with no recruitment. No fibrillations or positive waves were noted. The extensor indicis proprius muscle reveals 1 to 3 K units with full recruitment. No fibrillations or positive waves were noted. The pronator teres muscle reveals 2 to 3 K units with full recruitment. No fibrillations or positive waves were noted. The biceps muscle reveals 1 to 2 K units with full recruitment. No fibrillations or positive waves were noted. The triceps  muscle reveals 2 to 4 K units with full recruitment. No fibrillations or positive waves were noted. The anterior deltoid muscle reveals 2 to 3 K units with full recruitment. No fibrillations or positive waves were noted. The cervical paraspinal muscles were tested at 2 levels. No abnormalities of insertional activity were seen at either level tested. There was good relaxation.   IMPRESSION:  Nerve conduction studies done on both upper extremities shows evidence of bilateral carpal tunnel syndrome of moderate severity on the right and end-stage on the left. EMG evaluation of the left upper extremity shows no voluntary activity of the left APB muscle. There is no evidence of an overlying cervical radiculopathy. There appears to be some conduction slowing across the elbows bilaterally for the ulnar nerves without evidence of distal dysfunction.  Jill Alexanders MD 07/16/2016 2:44 PM  [facility] [address] [place], [place] [address]  Phone [phone] Fax [phone]

## 2016-07-16 NOTE — Progress Notes (Signed)
Please refer to EMG and nerve conduction study procedure note. 

## 2016-07-23 DIAGNOSIS — H353221 Exudative age-related macular degeneration, left eye, with active choroidal neovascularization: Secondary | ICD-10-CM | POA: Diagnosis not present

## 2016-08-02 ENCOUNTER — Other Ambulatory Visit: Payer: Self-pay | Admitting: Family Medicine

## 2016-08-04 ENCOUNTER — Encounter (HOSPITAL_BASED_OUTPATIENT_CLINIC_OR_DEPARTMENT_OTHER): Payer: Medicare Other | Attending: Internal Medicine

## 2016-08-04 DIAGNOSIS — B356 Tinea cruris: Secondary | ICD-10-CM | POA: Insufficient documentation

## 2016-08-04 DIAGNOSIS — Q059 Spina bifida, unspecified: Secondary | ICD-10-CM | POA: Insufficient documentation

## 2016-08-04 DIAGNOSIS — L89323 Pressure ulcer of left buttock, stage 3: Secondary | ICD-10-CM | POA: Insufficient documentation

## 2016-08-04 DIAGNOSIS — L89313 Pressure ulcer of right buttock, stage 3: Secondary | ICD-10-CM | POA: Diagnosis not present

## 2016-08-07 ENCOUNTER — Other Ambulatory Visit: Payer: Self-pay | Admitting: *Deleted

## 2016-08-20 DIAGNOSIS — E119 Type 2 diabetes mellitus without complications: Secondary | ICD-10-CM | POA: Diagnosis not present

## 2016-08-20 DIAGNOSIS — H43811 Vitreous degeneration, right eye: Secondary | ICD-10-CM | POA: Diagnosis not present

## 2016-08-20 DIAGNOSIS — H353221 Exudative age-related macular degeneration, left eye, with active choroidal neovascularization: Secondary | ICD-10-CM | POA: Diagnosis not present

## 2016-08-25 DIAGNOSIS — E119 Type 2 diabetes mellitus without complications: Secondary | ICD-10-CM | POA: Diagnosis not present

## 2016-08-25 DIAGNOSIS — H353221 Exudative age-related macular degeneration, left eye, with active choroidal neovascularization: Secondary | ICD-10-CM | POA: Diagnosis not present

## 2016-08-25 DIAGNOSIS — H26493 Other secondary cataract, bilateral: Secondary | ICD-10-CM | POA: Diagnosis not present

## 2016-08-25 DIAGNOSIS — H52203 Unspecified astigmatism, bilateral: Secondary | ICD-10-CM | POA: Diagnosis not present

## 2016-08-26 ENCOUNTER — Other Ambulatory Visit: Payer: Self-pay | Admitting: *Deleted

## 2016-08-26 MED ORDER — BUPROPION HCL ER (XL) 300 MG PO TB24: 300.0000 mg | ORAL_TABLET | Freq: Every day | ORAL | 3 refills | Status: DC

## 2016-08-28 DIAGNOSIS — I10 Essential (primary) hypertension: Secondary | ICD-10-CM | POA: Diagnosis not present

## 2016-08-28 DIAGNOSIS — E559 Vitamin D deficiency, unspecified: Secondary | ICD-10-CM | POA: Diagnosis not present

## 2016-08-28 DIAGNOSIS — Z79899 Other long term (current) drug therapy: Secondary | ICD-10-CM | POA: Diagnosis not present

## 2016-08-28 DIAGNOSIS — D509 Iron deficiency anemia, unspecified: Secondary | ICD-10-CM | POA: Diagnosis not present

## 2016-08-28 DIAGNOSIS — N183 Chronic kidney disease, stage 3 (moderate): Secondary | ICD-10-CM | POA: Diagnosis not present

## 2016-08-28 DIAGNOSIS — R809 Proteinuria, unspecified: Secondary | ICD-10-CM | POA: Diagnosis not present

## 2016-09-02 DIAGNOSIS — R809 Proteinuria, unspecified: Secondary | ICD-10-CM | POA: Diagnosis not present

## 2016-09-02 DIAGNOSIS — E1129 Type 2 diabetes mellitus with other diabetic kidney complication: Secondary | ICD-10-CM | POA: Diagnosis not present

## 2016-09-02 DIAGNOSIS — D649 Anemia, unspecified: Secondary | ICD-10-CM | POA: Diagnosis not present

## 2016-09-02 DIAGNOSIS — N183 Chronic kidney disease, stage 3 (moderate): Secondary | ICD-10-CM | POA: Diagnosis not present

## 2016-09-09 DIAGNOSIS — E1151 Type 2 diabetes mellitus with diabetic peripheral angiopathy without gangrene: Secondary | ICD-10-CM | POA: Diagnosis not present

## 2016-09-09 DIAGNOSIS — M79671 Pain in right foot: Secondary | ICD-10-CM | POA: Diagnosis not present

## 2016-09-09 DIAGNOSIS — L84 Corns and callosities: Secondary | ICD-10-CM | POA: Diagnosis not present

## 2016-09-09 DIAGNOSIS — B351 Tinea unguium: Secondary | ICD-10-CM | POA: Diagnosis not present

## 2016-09-14 ENCOUNTER — Other Ambulatory Visit: Payer: Self-pay | Admitting: Family Medicine

## 2016-09-18 ENCOUNTER — Other Ambulatory Visit: Payer: Self-pay | Admitting: Family Medicine

## 2016-09-22 ENCOUNTER — Ambulatory Visit (INDEPENDENT_AMBULATORY_CARE_PROVIDER_SITE_OTHER): Payer: Medicare Other | Admitting: Family Medicine

## 2016-09-22 ENCOUNTER — Encounter: Payer: Self-pay | Admitting: Family Medicine

## 2016-09-22 VITALS — BP 121/74 | HR 71 | Temp 97.8°F | Ht 68.0 in | Wt 218.0 lb

## 2016-09-22 DIAGNOSIS — R531 Weakness: Secondary | ICD-10-CM | POA: Insufficient documentation

## 2016-09-22 DIAGNOSIS — E559 Vitamin D deficiency, unspecified: Secondary | ICD-10-CM | POA: Diagnosis not present

## 2016-09-22 DIAGNOSIS — E7800 Pure hypercholesterolemia, unspecified: Secondary | ICD-10-CM | POA: Diagnosis not present

## 2016-09-22 DIAGNOSIS — Q057 Lumbar spina bifida without hydrocephalus: Secondary | ICD-10-CM | POA: Diagnosis not present

## 2016-09-22 DIAGNOSIS — G5603 Carpal tunnel syndrome, bilateral upper limbs: Secondary | ICD-10-CM

## 2016-09-22 DIAGNOSIS — E1121 Type 2 diabetes mellitus with diabetic nephropathy: Secondary | ICD-10-CM | POA: Diagnosis not present

## 2016-09-22 DIAGNOSIS — N183 Chronic kidney disease, stage 3 unspecified: Secondary | ICD-10-CM

## 2016-09-22 DIAGNOSIS — K5909 Other constipation: Secondary | ICD-10-CM

## 2016-09-22 DIAGNOSIS — I5032 Chronic diastolic (congestive) heart failure: Secondary | ICD-10-CM | POA: Diagnosis not present

## 2016-09-22 DIAGNOSIS — I739 Peripheral vascular disease, unspecified: Secondary | ICD-10-CM

## 2016-09-22 DIAGNOSIS — I10 Essential (primary) hypertension: Secondary | ICD-10-CM | POA: Diagnosis not present

## 2016-09-22 DIAGNOSIS — E1122 Type 2 diabetes mellitus with diabetic chronic kidney disease: Secondary | ICD-10-CM

## 2016-09-22 DIAGNOSIS — D692 Other nonthrombocytopenic purpura: Secondary | ICD-10-CM

## 2016-09-22 DIAGNOSIS — R29898 Other symptoms and signs involving the musculoskeletal system: Secondary | ICD-10-CM

## 2016-09-22 DIAGNOSIS — I6523 Occlusion and stenosis of bilateral carotid arteries: Secondary | ICD-10-CM

## 2016-09-22 LAB — BAYER DCA HB A1C WAIVED: HB A1C: 7.4 % — AB (ref <7.0)

## 2016-09-22 NOTE — Progress Notes (Signed)
Subjective:    Patient ID: [name]., male    DOB: [birth year], 83 y.o.   MRN: [id number]  HPI Pt here for follow up and management of chronic medical problems which includes diabetes and hyperlipidemia. He is taking medication regularly.The patient is doing well overall. He complains of ongoing weakness. He also has a couple skin lesions on his left arm he wants [place] to look at. He has an FOBT at home which she has failed to return and he promises to do this. He will get lab work today. His vital signs are stable. [name] is 83 years old with spina bifida and diabetes and a history of a silent MI in for the past. He looks younger than his stated age. The patient sees Dr. Marlou Porch for his cardiac follow-up on a yearly basis. He complains today of some progressive weakness in his legs and knees and he is fairly certain this is from his spina bifida. He has not seen a neurosurgeon in a good while we may arrange for him to see one just to see if there's anything we can do to slow this progression. He is status post a CABG from over 20 years ago. He is doing well as far as not complaining of any chest pain or shortness of breath anymore than would be expected other than with certain movements and activity has no shortness of breath. He has ongoing constipation and occasional blood in the stool from the constipation. Otherwise he denies any trouble swallowing with heartburn indigestion nausea vomiting or black tarry bowel movements. He's passing his water without problems. The skin on his buttocks continues to heal slowly and his wife is very meticulous about making sure that these pressure sores do not get any bigger. He is planning to see the ophthalmologist and August for some laser surgery because of some scarring and film due to previous cataract surgery.     Patient Active Problem List   Diagnosis Date Noted  . Leukocytosis 07/30/2015  . Decubitus ulcer, buttock 10/31/2014  . Chronic diastolic CHF  (congestive heart failure) ([place]) 09/22/2013  . Constipation 07/05/2013  . Cellulitis 01/19/2013  . Abscess or cellulitis of gluteal region 01/19/2013  . UTI (lower urinary tract infection) 01/19/2013  . Urinary incontinence 12/13/2012  . External hemorrhoids with complication 23/53/6144  . Chronic constipation 11/29/2012  . Metabolic syndrome 31/54/0086  . Hemorrhoid prolapse 11/29/2012  . Spina bifida ([facility]) 11/18/2012  . CAD, NATIVE VESSEL 03/08/2009  . Type 2 diabetes mellitus with diabetic nephropathy ([place]) 10/29/2008  . MALNUTRITION, PROTEIN-CALORIE 10/29/2008  . Type 2 diabetes mellitus with hyperlipidemia ([facility]) 10/29/2008  . HYPOKALEMIA 10/29/2008  . ANEMIA 10/29/2008  . PTSD 10/29/2008  . Essential hypertension 10/29/2008  . Chronic kidney disease 10/29/2008   Outpatient Encounter Prescriptions as of 09/22/2016  Medication Sig  . aspirin 81 MG tablet Take 81 mg by mouth daily.    Marland Kitchen buPROPion (WELLBUTRIN XL) 300 MG 24 hr tablet Take 1 tablet (300 mg total) by mouth daily.  . Cholecalciferol (VITAMIN D-3) 5000 UNITS TABS Take 1 tablet by mouth every other day.   . Coenzyme Q10 (CO Q-10) 100 MG CAPS Take 1 capsule by mouth 2 (two) times daily.   . furosemide (LASIX) 20 MG tablet Take 1 tablet (20 mg total) by mouth daily.  Marland Kitchen glipiZIDE (GLUCOTROL XL) 10 MG 24 hr tablet TAKE 1 TABLET DAILY  . JANUMET 50-500 MG tablet TAKE 1 TABLET DAILY WITH FOOD  . metoprolol  tartrate (LOPRESSOR) 25 MG tablet TAKE 1 TABLET TWICE A DAY  . Multiple Vitamins-Minerals (PRESERVISION AREDS 2 PO) Take 1 capsule by mouth 2 (two) times daily.  . nitroGLYCERIN (NITROSTAT) 0.4 MG SL tablet Place 1 tablet (0.4 mg total) under the tongue every 5 (five) minutes as needed.  . NON FORMULARY cran-actin 170m 1 tablet daily  . NON FORMULARY Sweetish Bitters Daily as needed  . Omega-3 Fatty Acids (FISH OIL) 1000 MG CAPS Take 1 capsule by mouth 2 (two) times daily.    . polyethylene glycol powder  (GLYCOLAX/MIRALAX) powder Take 17 g by mouth 2 (two) times daily as needed.  . sodium bicarbonate 650 MG tablet Take 650 mg by mouth 2 (two) times daily.   .Marland KitchenVYTORIN 10-20 MG tablet TAKE 1 TABLET DAILY AS DIRECTED   No facility-administered encounter medications on file as of 09/22/2016.      Review of Systems  HENT: Negative.   Eyes: Negative.   Respiratory: Negative.   Cardiovascular: Negative.   Gastrointestinal: Negative.   Endocrine: Negative.   Genitourinary: Negative.   Musculoskeletal: Negative.   Skin: Negative.        2 left arm skin lesions  Allergic/Immunologic: Negative.   Neurological: Positive for weakness.  Hematological: Negative.   Psychiatric/Behavioral: Negative.        Objective:   Physical Exam  Constitutional: He is oriented to person, place, and time. He appears well-developed and well-nourished. No distress.  The patient is calm and relaxed and looks younger than his stated age.  HENT:  Head: Normocephalic and atraumatic.  Right Ear: External ear normal.  Left Ear: External ear normal.  Nose: Nose normal.  Mouth/Throat: Oropharynx is clear and moist. No oropharyngeal exudate.  Eyes: Pupils are equal, round, and reactive to light. Conjunctivae and EOM are normal. Right eye exhibits no discharge. Left eye exhibits no discharge. No scleral icterus.  Neck: Normal range of motion. Neck supple. No thyromegaly present.  No bruits thyromegaly or anterior cervical adenopathy  Cardiovascular: Normal rate and regular rhythm.   No murmur heard. Heart is regular at 72/m. Pedal pulses and even radial pulses were difficult to palpate. The feet were asked he warm to palpation.  Pulmonary/Chest: Effort normal and breath sounds normal. No respiratory distress. He has no wheezes. He has no rales. He exhibits no tenderness.  Clear anteriorly and posteriorly no axillary adenopathy  Abdominal: Soft. Bowel sounds are normal. He exhibits no mass. There is no tenderness.  There is no rebound and no guarding.  No abdominal tenderness masses or bruits were noted. No organ enlargement.  Genitourinary:  Genitourinary Comments: The patient continues to have redness in the fold of his buttocks. There is one area of skin breakdown that is very small on the right side. Otherwise the areas seem stable with the treatment that is being provided by his wife currently.  Musculoskeletal: He exhibits no edema or tenderness.  The patient is confined to the rolling walker most of the time he comes to the visit today with his rolling walker.  Lymphadenopathy:    He has no cervical adenopathy.  Neurological: He is alert and oriented to person, place, and time.  Skin: Skin is warm and dry. Rash noted. There is erythema.  This is concerning the buttock pressure sores that he has. They do seem to be stable with only 1 small area noted on the right side.  Psychiatric: He has a normal mood and affect. His behavior is normal. Judgment and thought  content normal.  Nursing note and vitals reviewed.  BP 121/74 (BP Location: Left Arm)   Pulse 71   Temp 97.8 F (36.6 C) (Oral)   Ht '5\' 8"'  (1.727 m)   Wt 218 lb (98.9 kg)   BMI 33.15 kg/m         Assessment & Plan:  1. Type 2 diabetes mellitus with diabetic nephropathy, without long-term current use of insulin ([place]) -Continue with current treatment pending results of lab work. Continue with as aggressive therapeutic lifestyle changes as possible - BMP8+EGFR - CBC with Differential/Platelet - Bayer DCA Hb A1c Waived  2. Pure hypercholesterolemia -Continue with current treatment - CBC with Differential/Platelet - Lipid panel  3. Vitamin D deficiency -Continue with current treatment pending results of lab work - CBC with Differential/Platelet - VITAMIN D 25 Hydroxy (Vit-D Deficiency, Fractures)  4. Essential hypertension -The blood pressure is good today the patient will continue to watch his sodium intake and continue with  current treatment - BMP8+EGFR - CBC with Differential/Platelet - Hepatic function panel  5. Peripheral vascular insufficiency (HCC) -The patient does complain of knee and leg weakness with walking pushing his mobile walker. I do not believe this is circulatory in nature but could be. It is most likely secondary to his spinal bifida. We will arrange for him to see the neurosurgeon regarding his weakness and his legs and knees.  6. Chronic diastolic CHF (congestive heart failure) ([place]) -Follow-up with the cardiologist yearly  7. Spina bifida of lumbar region without hydrocephalus [facility]) - Ambulatory referral to Neurosurgery  8. Type 2 diabetes mellitus with stage 3 chronic kidney disease, without long-term current use of insulin ([place]) -Continue current treatment pending results of lab work  9. General weakness -Labs plus follow-up with neurosurgery - Ambulatory referral to Neurosurgery  10. Bilateral leg weakness - Ambulatory referral to Neurosurgery  11. Senile purpura ([place]) -This is mostly present on his arms.  12. Bilateral carpal tunnel syndrome -He was told by the neurologist that the carpal tunnel syndrome is so advanced that no surgery will help this.  13. Chronic constipation -This is a problem which goes along with his spinal bifida. He does have occasional blood from the constipation. Continue with stool softeners. Drink plenty of fluids and stay well hydrated.  Patient Instructions                       Medicare Annual Wellness Visit  [facility] and the medical providers at [facility] strive to bring you the best medical care.  In doing so we not only want to address your current medical conditions and concerns but also to detect new conditions early and prevent illness, disease and health-related problems.    Medicare offers a yearly Wellness Visit which allows our clinical staff to assess your need for preventative services including  immunizations, lifestyle education, counseling to decrease risk of preventable diseases and screening for fall risk and other medical concerns.    This visit is provided free of charge (no copay) for all Medicare recipients. The clinical pharmacists at [facility] have begun to conduct these Wellness Visits which will also include a thorough review of all your medications.    As you primary medical provider recommend that you make an appointment for your Annual Wellness Visit if you have not done so already this year.  You may set up this appointment before you leave today or you may call back ([phone]) and  schedule an appointment.  Please make sure when you call that you mention that you are scheduling your Annual Wellness Visit with the clinical pharmacist so that the appointment may be made for the proper length of time.     Continue current medications. Continue good therapeutic lifestyle changes which include good diet and exercise. Fall precautions discussed with patient. If an FOBT was given today- please return it to our front desk. If you are over 88 years old - you may need Prevnar 27 or the adult Pneumonia vaccine.  **Flu shots are available--- please call and schedule a FLU-CLINIC appointment**  After your visit with [place] today you will receive a survey in the mail or online from [employer] regarding your care with [place]. Please take a moment to fill this out. Your feedback is very important to [place] as you can help [place] better understand your patient needs as well as improve your experience and satisfaction. WE CARE ABOUT YOU!!!   Follow-up with cardiology as planned Continue to monitor skin on buttocks We will schedule visit with the neurosurgeon to see if there's anything that we can do to stem the progressive weakness that you're feeling in her lower extremities [facility]. The lesions on the left arm are basically seborrheic keratoses  and less they begin to give you more trouble we will continue to monitor these. Pressure sores, continue with current treatment  Arrie Senate MD

## 2016-09-22 NOTE — Patient Instructions (Addendum)
Medicare Annual Wellness Visit  [facility] and the medical providers at [facility] strive to bring you the best medical care.  In doing so we not only want to address your current medical conditions and concerns but also to detect new conditions early and prevent illness, disease and health-related problems.    Medicare offers a yearly Wellness Visit which allows our clinical staff to assess your need for preventative services including immunizations, lifestyle education, counseling to decrease risk of preventable diseases and screening for fall risk and other medical concerns.    This visit is provided free of charge (no copay) for all Medicare recipients. The clinical pharmacists at [facility] have begun to conduct these Wellness Visits which will also include a thorough review of all your medications.    As you primary medical provider recommend that you make an appointment for your Annual Wellness Visit if you have not done so already this year.  You may set up this appointment before you leave today or you may call back ([phone]) and schedule an appointment.  Please make sure when you call that you mention that you are scheduling your Annual Wellness Visit with the clinical pharmacist so that the appointment may be made for the proper length of time.     Continue current medications. Continue good therapeutic lifestyle changes which include good diet and exercise. Fall precautions discussed with patient. If an FOBT was given today- please return it to our front desk. If you are over 67 years old - you may need Prevnar 12 or the adult Pneumonia vaccine.  **Flu shots are available--- please call and schedule a FLU-CLINIC appointment**  After your visit with [place] today you will receive a survey in the mail or online from [employer] regarding your care with [place]. Please take a moment to fill this out. Your feedback is very  important to [place] as you can help [place] better understand your patient needs as well as improve your experience and satisfaction. WE CARE ABOUT YOU!!!   Follow-up with cardiology as planned Continue to monitor skin on buttocks We will schedule visit with the neurosurgeon to see if there's anything that we can do to stem the progressive weakness that you're feeling in her lower extremities [facility]. The lesions on the left arm are basically seborrheic keratoses and less they begin to give you more trouble we will continue to monitor these. Pressure sores, continue with current treatment

## 2016-09-22 NOTE — Addendum Note (Signed)
Addended by: Zannie Cove on: 09/22/2016 02:32 PM   Modules accepted: Orders

## 2016-09-23 ENCOUNTER — Encounter: Payer: Self-pay | Admitting: Family Medicine

## 2016-09-24 DIAGNOSIS — H353221 Exudative age-related macular degeneration, left eye, with active choroidal neovascularization: Secondary | ICD-10-CM | POA: Diagnosis not present

## 2016-09-30 DIAGNOSIS — M9903 Segmental and somatic dysfunction of lumbar region: Secondary | ICD-10-CM | POA: Diagnosis not present

## 2016-09-30 DIAGNOSIS — M9904 Segmental and somatic dysfunction of sacral region: Secondary | ICD-10-CM | POA: Diagnosis not present

## 2016-09-30 DIAGNOSIS — M5430 Sciatica, unspecified side: Secondary | ICD-10-CM | POA: Diagnosis not present

## 2016-09-30 DIAGNOSIS — M9905 Segmental and somatic dysfunction of pelvic region: Secondary | ICD-10-CM | POA: Diagnosis not present

## 2016-10-02 DIAGNOSIS — H26491 Other secondary cataract, right eye: Secondary | ICD-10-CM | POA: Diagnosis not present

## 2016-10-14 LAB — CBC WITH DIFFERENTIAL/PLATELET
BASOS ABS: 0 10*3/uL (ref 0.0–0.2)
BASOS: 0 %
EOS (ABSOLUTE): 0.1 10*3/uL (ref 0.0–0.4)
Eos: 1 %
HEMATOCRIT: 43.5 % (ref 37.5–51.0)
Hemoglobin: 14.9 g/dL (ref 13.0–17.7)
IMMATURE GRANS (ABS): 0 10*3/uL (ref 0.0–0.1)
Immature Granulocytes: 0 %
LYMPHS ABS: 2.3 10*3/uL (ref 0.7–3.1)
Lymphs: 16 %
MCH: 30.9 pg (ref 26.6–33.0)
MCHC: 34.3 g/dL (ref 31.5–35.7)
MCV: 90 fL (ref 79–97)
MONOCYTES: 9 %
Monocytes Absolute: 1.2 10*3/uL — ABNORMAL HIGH (ref 0.1–0.9)
NEUTROS ABS: 10.4 10*3/uL — AB (ref 1.4–7.0)
Neutrophils: 74 %
Platelets: 219 10*3/uL (ref 150–379)
RBC: 4.82 x10E6/uL (ref 4.14–5.80)
RDW: 14.1 % (ref 12.3–15.4)
WBC: 14.1 10*3/uL — ABNORMAL HIGH (ref 3.4–10.8)

## 2016-10-14 LAB — BMP8+EGFR
BUN / CREAT RATIO: 17 (ref 10–24)
BUN: 32 mg/dL — ABNORMAL HIGH (ref 8–27)
CALCIUM: 9.3 mg/dL (ref 8.6–10.2)
CHLORIDE: 98 mmol/L (ref 96–106)
CO2: 22 mmol/L (ref 20–29)
Creatinine, Ser: 1.93 mg/dL — ABNORMAL HIGH (ref 0.76–1.27)
GFR, EST AFRICAN AMERICAN: 36 mL/min/{1.73_m2} — AB (ref >59)
GFR, EST NON AFRICAN AMERICAN: 31 mL/min/{1.73_m2} — AB (ref >59)
Glucose: 162 mg/dL — ABNORMAL HIGH (ref 65–99)
Potassium: 5 mmol/L (ref 3.5–5.2)
Sodium: 140 mmol/L (ref 134–144)

## 2016-10-14 LAB — HEPATIC FUNCTION PANEL
ALT: 16 IU/L (ref 0–44)
AST: 22 IU/L (ref 0–40)
Albumin: 4.3 g/dL (ref 3.5–4.7)
Alkaline Phosphatase: 92 IU/L (ref 39–117)
Bilirubin Total: 0.5 mg/dL (ref 0.0–1.2)
Bilirubin, Direct: 0.12 mg/dL (ref 0.00–0.40)
TOTAL PROTEIN: 6.7 g/dL (ref 6.0–8.5)

## 2016-10-14 LAB — LIPID PANEL
CHOL/HDL RATIO: 3.3 ratio (ref 0.0–5.0)
Cholesterol, Total: 150 mg/dL (ref 100–199)
HDL: 46 mg/dL (ref >39)
LDL CALC: 77 mg/dL (ref 0–99)
Triglycerides: 136 mg/dL (ref 0–149)
VLDL CHOLESTEROL CAL: 27 mg/dL (ref 5–40)

## 2016-10-14 LAB — VITAMIN D 25 HYDROXY (VIT D DEFICIENCY, FRACTURES): Vit D, 25-Hydroxy: 36.4 ng/mL (ref 30.0–100.0)

## 2016-10-15 ENCOUNTER — Other Ambulatory Visit: Payer: Self-pay | Admitting: Family Medicine

## 2016-10-29 DIAGNOSIS — H353221 Exudative age-related macular degeneration, left eye, with active choroidal neovascularization: Secondary | ICD-10-CM | POA: Diagnosis not present

## 2016-12-06 ENCOUNTER — Other Ambulatory Visit: Payer: Self-pay | Admitting: Family Medicine

## 2016-12-09 DIAGNOSIS — L84 Corns and callosities: Secondary | ICD-10-CM | POA: Diagnosis not present

## 2016-12-09 DIAGNOSIS — M79673 Pain in unspecified foot: Secondary | ICD-10-CM | POA: Diagnosis not present

## 2016-12-09 DIAGNOSIS — B351 Tinea unguium: Secondary | ICD-10-CM | POA: Diagnosis not present

## 2016-12-09 DIAGNOSIS — E1151 Type 2 diabetes mellitus with diabetic peripheral angiopathy without gangrene: Secondary | ICD-10-CM | POA: Diagnosis not present

## 2016-12-10 DIAGNOSIS — H353221 Exudative age-related macular degeneration, left eye, with active choroidal neovascularization: Secondary | ICD-10-CM | POA: Diagnosis not present

## 2016-12-29 DIAGNOSIS — Z79899 Other long term (current) drug therapy: Secondary | ICD-10-CM | POA: Diagnosis not present

## 2016-12-29 DIAGNOSIS — E559 Vitamin D deficiency, unspecified: Secondary | ICD-10-CM | POA: Diagnosis not present

## 2016-12-29 DIAGNOSIS — N183 Chronic kidney disease, stage 3 (moderate): Secondary | ICD-10-CM | POA: Diagnosis not present

## 2016-12-29 DIAGNOSIS — D509 Iron deficiency anemia, unspecified: Secondary | ICD-10-CM | POA: Diagnosis not present

## 2016-12-29 DIAGNOSIS — I10 Essential (primary) hypertension: Secondary | ICD-10-CM | POA: Diagnosis not present

## 2016-12-29 DIAGNOSIS — R809 Proteinuria, unspecified: Secondary | ICD-10-CM | POA: Diagnosis not present

## 2017-01-03 ENCOUNTER — Other Ambulatory Visit: Payer: Self-pay | Admitting: Family Medicine

## 2017-01-21 DIAGNOSIS — H353221 Exudative age-related macular degeneration, left eye, with active choroidal neovascularization: Secondary | ICD-10-CM | POA: Diagnosis not present

## 2017-01-23 ENCOUNTER — Ambulatory Visit (INDEPENDENT_AMBULATORY_CARE_PROVIDER_SITE_OTHER): Payer: Medicare Other | Admitting: Family Medicine

## 2017-01-23 ENCOUNTER — Encounter: Payer: Self-pay | Admitting: Family Medicine

## 2017-01-23 VITALS — BP 122/70 | HR 73 | Temp 97.0°F | Ht 68.0 in | Wt 224.0 lb

## 2017-01-23 DIAGNOSIS — R531 Weakness: Secondary | ICD-10-CM | POA: Diagnosis not present

## 2017-01-23 DIAGNOSIS — Q057 Lumbar spina bifida without hydrocephalus: Secondary | ICD-10-CM

## 2017-01-23 DIAGNOSIS — I739 Peripheral vascular disease, unspecified: Secondary | ICD-10-CM | POA: Diagnosis not present

## 2017-01-23 DIAGNOSIS — I10 Essential (primary) hypertension: Secondary | ICD-10-CM | POA: Diagnosis not present

## 2017-01-23 DIAGNOSIS — E7800 Pure hypercholesterolemia, unspecified: Secondary | ICD-10-CM

## 2017-01-23 DIAGNOSIS — D692 Other nonthrombocytopenic purpura: Secondary | ICD-10-CM

## 2017-01-23 DIAGNOSIS — E559 Vitamin D deficiency, unspecified: Secondary | ICD-10-CM | POA: Diagnosis not present

## 2017-01-23 DIAGNOSIS — I6523 Occlusion and stenosis of bilateral carotid arteries: Secondary | ICD-10-CM

## 2017-01-23 DIAGNOSIS — Z23 Encounter for immunization: Secondary | ICD-10-CM | POA: Diagnosis not present

## 2017-01-23 DIAGNOSIS — E1121 Type 2 diabetes mellitus with diabetic nephropathy: Secondary | ICD-10-CM | POA: Diagnosis not present

## 2017-01-23 DIAGNOSIS — I5032 Chronic diastolic (congestive) heart failure: Secondary | ICD-10-CM

## 2017-01-23 LAB — URINALYSIS, COMPLETE
Bilirubin, UA: NEGATIVE
Glucose, UA: NEGATIVE
Ketones, UA: NEGATIVE
NITRITE UA: NEGATIVE
PH UA: 6 (ref 5.0–7.5)
Protein, UA: NEGATIVE
RBC, UA: NEGATIVE
Specific Gravity, UA: 1.02 (ref 1.005–1.030)
Urobilinogen, Ur: 0.2 mg/dL (ref 0.2–1.0)

## 2017-01-23 LAB — MICROSCOPIC EXAMINATION: Renal Epithel, UA: NONE SEEN /hpf

## 2017-01-23 NOTE — Patient Instructions (Addendum)
Medicare Annual Wellness Visit  [facility] and the medical providers at [facility] strive to bring you the best medical care.  In doing so we not only want to address your current medical conditions and concerns but also to detect new conditions early and prevent illness, disease and health-related problems.    Medicare offers a yearly Wellness Visit which allows our clinical staff to assess your need for preventative services including immunizations, lifestyle education, counseling to decrease risk of preventable diseases and screening for fall risk and other medical concerns.    This visit is provided free of charge (no copay) for all Medicare recipients. The clinical pharmacists at [facility] have begun to conduct these Wellness Visits which will also include a thorough review of all your medications.    As you primary medical provider recommend that you make an appointment for your Annual Wellness Visit if you have not done so already this year.  You may set up this appointment before you leave today or you may call back ([phone]) and schedule an appointment.  Please make sure when you call that you mention that you are scheduling your Annual Wellness Visit with the clinical pharmacist so that the appointment may be made for the proper length of time.     Continue current medications. Continue good therapeutic lifestyle changes which include good diet and exercise. Fall precautions discussed with patient. If an FOBT was given today- please return it to our front desk. If you are over 90 years old - you may need Prevnar 36 or the adult Pneumonia vaccine.  **Flu shots are available--- please call and schedule a FLU-CLINIC appointment**  After your visit with [place] today you will receive a survey in the mail or online from [employer] regarding your care with [place]. Please take a moment to fill this out. Your feedback is very  important to [place] as you can help [place] better understand your patient needs as well as improve your experience and satisfaction. WE CARE ABOUT YOU!!!   Continue to follow-up with cardiology and neurosurgery.

## 2017-01-23 NOTE — Progress Notes (Signed)
Subjective:    Patient ID: Bobby Lamas., male    DOB: May 13, 1930, 83 y.o.   MRN: [id number]  HPI Pt here for follow up and management of chronic medical problems which includes diabetes, hyperlipidemia and hypertension. He is taking medication regularly.  The patient today complains of itching on his forehead.  He is due today to return and FOBT get a chest x-ray and get lab work and a urinalysis.  He has plans to see the neurosurgeon on December 19.  He will also get his flu shot today.  This patient has a history of spina bifida.  He also has had a myocardial infarction in the past and has diabetes.  He has constant problems with sacral pressure scores and blood pressure sores.  His wife helps him with this.  The patient is doing well overall.  In fact, his pressure sores have cleared significantly with only mild irritation now and this is good.  He denies any chest pain or shortness of breath.  He denies any trouble with swallowing heartburn indigestion nausea vomiting diarrhea or blood in the stool.  No change in bowel habits other than he does have a soft stool every now and then but not a loose stool.  He has urinary incontinence because of the spina bifida.  He can control this to some extent some time.  He sees the cardiologist regularly.  He has plans to see the neurosurgeon soon.  This is because he has seen him in the distant past and just to do a follow-up to make sure there is nothing any different that can be done moving forward for his condition.     Patient Active Problem List   Diagnosis Date Noted  . Senile purpura ([place]) 09/22/2016  . General weakness 09/22/2016  . Bilateral carpal tunnel syndrome 09/22/2016  . Leukocytosis 07/30/2015  . Decubitus ulcer, buttock 10/31/2014  . Chronic diastolic CHF (congestive heart failure) ([place]) 09/22/2013  . Constipation 07/05/2013  . Cellulitis 01/19/2013  . Abscess or cellulitis of gluteal region 01/19/2013  . UTI (lower urinary  tract infection) 01/19/2013  . Urinary incontinence 12/13/2012  . External hemorrhoids with complication 76/22/6333  . Chronic constipation 11/29/2012  . Metabolic syndrome 54/56/2563  . Hemorrhoid prolapse 11/29/2012  . Spina bifida ([place]) 11/18/2012  . CAD, NATIVE VESSEL 03/08/2009  . Type 2 diabetes mellitus with diabetic nephropathy ([place]) 10/29/2008  . MALNUTRITION, PROTEIN-CALORIE 10/29/2008  . Type 2 diabetes mellitus with stage 3 chronic kidney disease, without long-term current use of insulin ([place]) 10/29/2008  . HYPOKALEMIA 10/29/2008  . ANEMIA 10/29/2008  . PTSD 10/29/2008  . Essential hypertension 10/29/2008  . Chronic kidney disease, stage IV (severe) ([place]) 10/29/2008   Outpatient Encounter Medications as of 01/23/2017  Medication Sig  . aspirin 81 MG tablet Take 81 mg by mouth daily.    Marland Kitchen buPROPion (WELLBUTRIN XL) 300 MG 24 hr tablet Take 1 tablet (300 mg total) by mouth daily.  . calcitRIOL (ROCALTROL) 0.25 MCG capsule TAKE ONE CAPSULE BY MOUTH ONCE DAILY ON MONDAY, WEDNESDAY, AND FRIDAY  . Coenzyme Q10 (CO Q-10) 100 MG CAPS Take 1 capsule by mouth 2 (two) times daily.   . furosemide (LASIX) 20 MG tablet TAKE 1 TABLET DAILY  . glipiZIDE (GLUCOTROL XL) 10 MG 24 hr tablet TAKE 1 TABLET DAILY  . JANUMET 50-500 MG tablet TAKE 1 TABLET DAILY WITH FOOD  . metoprolol tartrate (LOPRESSOR) 25 MG tablet TAKE 1 TABLET TWICE A DAY  . Multiple  Vitamins-Minerals (PRESERVISION AREDS 2 PO) Take 1 capsule by mouth 2 (two) times daily.  . nitroGLYCERIN (NITROSTAT) 0.4 MG SL tablet Place 1 tablet (0.4 mg total) under the tongue every 5 (five) minutes as needed.  . NON FORMULARY cran-actin 140m 1 tablet daily  . NON FORMULARY Sweetish Bitters Daily as needed  . Omega-3 Fatty Acids (FISH OIL) 1000 MG CAPS Take 1 capsule by mouth 2 (two) times daily.    . polyethylene glycol powder (GLYCOLAX/MIRALAX) powder Take 17 g by mouth 2 (two) times daily as needed.  . sodium bicarbonate 650 MG tablet  Take 650 mg by mouth 2 (two) times daily.   .Marland KitchenVYTORIN 10-20 MG tablet TAKE 1 TABLET DAILY AS DIRECTED  . [DISCONTINUED] Cholecalciferol (VITAMIN D-3) 5000 UNITS TABS Take 1 tablet by mouth every other day.    No facility-administered encounter medications on file as of 01/23/2017.       Review of Systems  Constitutional: Negative.   HENT: Negative.   Eyes: Negative.   Respiratory: Negative.   Cardiovascular: Negative.   Gastrointestinal: Negative.   Endocrine: Negative.   Genitourinary: Negative.   Musculoskeletal: Negative.   Skin: Negative.        Forehead skin - dry/ itching   Allergic/Immunologic: Negative.   Neurological: Negative.   Hematological: Negative.   Psychiatric/Behavioral: Negative.        Objective:   Physical Exam  Constitutional: He is oriented to person, place, and time. He appears well-developed and well-nourished. No distress.  The patient is pleasant and alert and smiling and comes to the visit with his rolling walker/wheelchair.  HENT:  Head: Normocephalic and atraumatic.  Right Ear: External ear normal.  Left Ear: External ear normal.  Nose: Nose normal.  Mouth/Throat: Oropharynx is clear and moist. No oropharyngeal exudate.  Eyes: Conjunctivae and EOM are normal. Pupils are equal, round, and reactive to light. Right eye exhibits no discharge. Left eye exhibits no discharge. No scleral icterus.  Neck: Normal range of motion. Neck supple. No thyromegaly present.  No bruits thyromegaly or anterior cervical adenopathy  Cardiovascular: Normal rate, regular rhythm, normal heart sounds and intact distal pulses.  No murmur heard. Regular rate and rhythm at 72/min  Pulmonary/Chest: Effort normal and breath sounds normal. No respiratory distress. He has no wheezes. He has no rales. He exhibits no tenderness.  Clear anteriorly and posteriorly and no axillary adenopathy  Abdominal: Soft. Bowel sounds are normal. He exhibits no mass. There is no tenderness.  There is no rebound and no guarding.  No abdominal tenderness masses or organ enlargement palpated.  Genitourinary: Rectum normal and penis normal.  Genitourinary Comments: No rectal masses and no prostate lumps or masses.  External genitalia appeared normal.  Musculoskeletal: He exhibits no edema.  Patient has gait instability and spina bifida and is unable to stand without support.  Lymphadenopathy:    He has no cervical adenopathy.  Neurological: He is alert and oriented to person, place, and time. No cranial nerve deficit.  Skin: Skin is warm and dry. No rash noted.  Sacral pressure sores have almost completely healed there is slight discoloration and no drainage or breakdown currently.  Psychiatric: He has a normal mood and affect. His behavior is normal. Judgment and thought content normal.  Nursing note and vitals reviewed.   BP 122/70 (BP Location: Left Arm)   Pulse 73   Temp (!) 97 F (36.1 C) (Oral)   Ht _0  (1.727 m)   Wt 224 lb (101.6  kg)   BMI 34.06 kg/m        Assessment & Plan:  1. Type 2 diabetes mellitus with diabetic nephropathy, without long-term current use of insulin ([place]) -Patient says that blood sugars at home of been running in the 78-95 range and he will continue with his current treatment regimen and blood sugar checks. - BMP8+EGFR - CBC with Differential/Platelet  2. Pure hypercholesterolemia -Continue with current treatment pending results of lab work to be returned - CBC with Differential/Platelet - Lipid panel - DG Chest 2 View; Future  3. Vitamin D deficiency -Continue with vitamin D replacement pending results of lab work - CBC with Differential/Platelet - VITAMIN D 25 Hydroxy (Vit-D Deficiency, Fractures)  4. Essential hypertension -Blood pressure is good today and he will continue with current treatment - BMP8+EGFR - CBC with Differential/Platelet - Hepatic function panel - DG Chest 2 View; Future  5. Peripheral vascular  insufficiency (HCC) - CBC with Differential/Platelet - DG Chest 2 View; Future  6. Chronic diastolic CHF (congestive heart failure) ([place]) -Follow-up with cardiology as planned - CBC with Differential/Platelet  7. General weakness - CBC with Differential/Platelet - Urinalysis, Complete  8. Senile purpura ([place]) -The patient has areas of bruising on both hands and arms.  28. Spina bifida of lumbar region without hydrocephalus [facility]) -Follow-up with neurosurgery as planned  Patient Instructions                       Medicare Annual Wellness Visit  [facility] and the medical providers at [facility] strive to bring you the best medical care.  In doing so we not only want to address your current medical conditions and concerns but also to detect new conditions early and prevent illness, disease and health-related problems.    Medicare offers a yearly Wellness Visit which allows our clinical staff to assess your need for preventative services including immunizations, lifestyle education, counseling to decrease risk of preventable diseases and screening for fall risk and other medical concerns.    This visit is provided free of charge (no copay) for all Medicare recipients. The clinical pharmacists at [facility] have begun to conduct these Wellness Visits which will also include a thorough review of all your medications.    As you primary medical provider recommend that you make an appointment for your Annual Wellness Visit if you have not done so already this year.  You may set up this appointment before you leave today or you may call back ([phone]) and schedule an appointment.  Please make sure when you call that you mention that you are scheduling your Annual Wellness Visit with the clinical pharmacist so that the appointment may be made for the proper length of time.     Continue current medications. Continue good therapeutic lifestyle  changes which include good diet and exercise. Fall precautions discussed with patient. If an FOBT was given today- please return it to our front desk. If you are over 82 years old - you may need Prevnar 40 or the adult Pneumonia vaccine.  **Flu shots are available--- please call and schedule a FLU-CLINIC appointment**  After your visit with [place] today you will receive a survey in the mail or online from [employer] regarding your care with [place]. Please take a moment to fill this out. Your feedback is very important to [place] as you can help [place] better understand your patient needs as well as improve your experience and  satisfaction. WE CARE ABOUT YOU!!!   Continue to follow-up with cardiology and neurosurgery.  Arrie Senate MD

## 2017-01-24 LAB — BMP8+EGFR
BUN/Creatinine Ratio: 21 (ref 10–24)
BUN: 35 mg/dL — ABNORMAL HIGH (ref 8–27)
CO2: 23 mmol/L (ref 20–29)
Calcium: 9.4 mg/dL (ref 8.6–10.2)
Chloride: 100 mmol/L (ref 96–106)
Creatinine, Ser: 1.68 mg/dL — ABNORMAL HIGH (ref 0.76–1.27)
GFR, EST AFRICAN AMERICAN: 42 mL/min/{1.73_m2} — AB (ref >59)
GFR, EST NON AFRICAN AMERICAN: 37 mL/min/{1.73_m2} — AB (ref >59)
Glucose: 174 mg/dL — ABNORMAL HIGH (ref 65–99)
POTASSIUM: 5.5 mmol/L — AB (ref 3.5–5.2)
SODIUM: 141 mmol/L (ref 134–144)

## 2017-01-24 LAB — HEPATIC FUNCTION PANEL
ALT: 20 IU/L (ref 0–44)
AST: 22 IU/L (ref 0–40)
Albumin: 4.3 g/dL (ref 3.5–4.7)
Alkaline Phosphatase: 88 IU/L (ref 39–117)
BILIRUBIN TOTAL: 0.5 mg/dL (ref 0.0–1.2)
BILIRUBIN, DIRECT: 0.17 mg/dL (ref 0.00–0.40)
Total Protein: 6.7 g/dL (ref 6.0–8.5)

## 2017-01-24 LAB — CBC WITH DIFFERENTIAL/PLATELET
BASOS: 0 %
Basophils Absolute: 0 10*3/uL (ref 0.0–0.2)
EOS (ABSOLUTE): 0.1 10*3/uL (ref 0.0–0.4)
Eos: 1 %
Hematocrit: 42.3 % (ref 37.5–51.0)
Hemoglobin: 13.9 g/dL (ref 13.0–17.7)
Immature Grans (Abs): 0 10*3/uL (ref 0.0–0.1)
Immature Granulocytes: 0 %
LYMPHS ABS: 2 10*3/uL (ref 0.7–3.1)
Lymphs: 17 %
MCH: 30.2 pg (ref 26.6–33.0)
MCHC: 32.9 g/dL (ref 31.5–35.7)
MCV: 92 fL (ref 79–97)
MONOS ABS: 1 10*3/uL — AB (ref 0.1–0.9)
Monocytes: 8 %
NEUTROS ABS: 8.9 10*3/uL — AB (ref 1.4–7.0)
Neutrophils: 74 %
PLATELETS: 228 10*3/uL (ref 150–379)
RBC: 4.6 x10E6/uL (ref 4.14–5.80)
RDW: 14.1 % (ref 12.3–15.4)
WBC: 12.1 10*3/uL — ABNORMAL HIGH (ref 3.4–10.8)

## 2017-01-24 LAB — VITAMIN D 25 HYDROXY (VIT D DEFICIENCY, FRACTURES): VIT D 25 HYDROXY: 57.9 ng/mL (ref 30.0–100.0)

## 2017-01-24 LAB — LIPID PANEL
Chol/HDL Ratio: 2.8 ratio (ref 0.0–5.0)
Cholesterol, Total: 139 mg/dL (ref 100–199)
HDL: 50 mg/dL (ref >39)
LDL Calculated: 71 mg/dL (ref 0–99)
Triglycerides: 88 mg/dL (ref 0–149)
VLDL CHOLESTEROL CAL: 18 mg/dL (ref 5–40)

## 2017-01-26 ENCOUNTER — Other Ambulatory Visit: Payer: Self-pay | Admitting: *Deleted

## 2017-01-26 DIAGNOSIS — E875 Hyperkalemia: Secondary | ICD-10-CM

## 2017-01-26 DIAGNOSIS — R8290 Unspecified abnormal findings in urine: Secondary | ICD-10-CM

## 2017-01-28 ENCOUNTER — Other Ambulatory Visit: Payer: Medicare Other

## 2017-01-28 DIAGNOSIS — E875 Hyperkalemia: Secondary | ICD-10-CM | POA: Diagnosis not present

## 2017-01-28 DIAGNOSIS — R8290 Unspecified abnormal findings in urine: Secondary | ICD-10-CM | POA: Diagnosis not present

## 2017-01-28 LAB — URINALYSIS, COMPLETE
Bilirubin, UA: NEGATIVE
Glucose, UA: NEGATIVE
KETONES UA: NEGATIVE
NITRITE UA: NEGATIVE
PH UA: 7 (ref 5.0–7.5)
Protein, UA: NEGATIVE
RBC, UA: NEGATIVE
Specific Gravity, UA: 1.015 (ref 1.005–1.030)
Urobilinogen, Ur: 1 mg/dL (ref 0.2–1.0)

## 2017-01-28 LAB — MICROSCOPIC EXAMINATION
RBC MICROSCOPIC, UA: NONE SEEN /HPF (ref 0–?)
RENAL EPITHEL UA: NONE SEEN /HPF

## 2017-01-29 LAB — BMP8+EGFR
BUN / CREAT RATIO: 16 (ref 10–24)
BUN: 29 mg/dL — ABNORMAL HIGH (ref 8–27)
CO2: 25 mmol/L (ref 20–29)
CREATININE: 1.81 mg/dL — AB (ref 0.76–1.27)
Calcium: 9.4 mg/dL (ref 8.6–10.2)
Chloride: 97 mmol/L (ref 96–106)
GFR calc non Af Amer: 34 mL/min/{1.73_m2} — ABNORMAL LOW (ref >59)
GFR, EST AFRICAN AMERICAN: 39 mL/min/{1.73_m2} — AB (ref >59)
GLUCOSE: 152 mg/dL — AB (ref 65–99)
Potassium: 4.8 mmol/L (ref 3.5–5.2)
SODIUM: 139 mmol/L (ref 134–144)

## 2017-03-03 DIAGNOSIS — M5136 Other intervertebral disc degeneration, lumbar region: Secondary | ICD-10-CM | POA: Diagnosis not present

## 2017-03-03 DIAGNOSIS — M549 Dorsalgia, unspecified: Secondary | ICD-10-CM | POA: Diagnosis not present

## 2017-03-03 DIAGNOSIS — M48062 Spinal stenosis, lumbar region with neurogenic claudication: Secondary | ICD-10-CM | POA: Diagnosis not present

## 2017-03-03 DIAGNOSIS — M4155 Other secondary scoliosis, thoracolumbar region: Secondary | ICD-10-CM | POA: Diagnosis not present

## 2017-03-03 DIAGNOSIS — G8220 Paraplegia, unspecified: Secondary | ICD-10-CM | POA: Diagnosis not present

## 2017-03-03 DIAGNOSIS — M47816 Spondylosis without myelopathy or radiculopathy, lumbar region: Secondary | ICD-10-CM | POA: Diagnosis not present

## 2017-03-03 DIAGNOSIS — M546 Pain in thoracic spine: Secondary | ICD-10-CM | POA: Diagnosis not present

## 2017-03-04 DIAGNOSIS — H353221 Exudative age-related macular degeneration, left eye, with active choroidal neovascularization: Secondary | ICD-10-CM | POA: Diagnosis not present

## 2017-03-04 DIAGNOSIS — E119 Type 2 diabetes mellitus without complications: Secondary | ICD-10-CM | POA: Diagnosis not present

## 2017-03-06 ENCOUNTER — Other Ambulatory Visit: Payer: Self-pay | Admitting: Family Medicine

## 2017-03-10 DIAGNOSIS — E1151 Type 2 diabetes mellitus with diabetic peripheral angiopathy without gangrene: Secondary | ICD-10-CM | POA: Diagnosis not present

## 2017-03-10 DIAGNOSIS — L84 Corns and callosities: Secondary | ICD-10-CM | POA: Diagnosis not present

## 2017-03-10 DIAGNOSIS — B351 Tinea unguium: Secondary | ICD-10-CM | POA: Diagnosis not present

## 2017-03-10 DIAGNOSIS — M79673 Pain in unspecified foot: Secondary | ICD-10-CM | POA: Diagnosis not present

## 2017-03-24 DIAGNOSIS — N312 Flaccid neuropathic bladder, not elsewhere classified: Secondary | ICD-10-CM | POA: Diagnosis not present

## 2017-03-24 DIAGNOSIS — N3000 Acute cystitis without hematuria: Secondary | ICD-10-CM | POA: Diagnosis not present

## 2017-03-31 ENCOUNTER — Other Ambulatory Visit: Payer: Self-pay | Admitting: Neurosurgery

## 2017-03-31 DIAGNOSIS — G8220 Paraplegia, unspecified: Secondary | ICD-10-CM

## 2017-04-01 DIAGNOSIS — H353221 Exudative age-related macular degeneration, left eye, with active choroidal neovascularization: Secondary | ICD-10-CM | POA: Diagnosis not present

## 2017-04-01 DIAGNOSIS — H353112 Nonexudative age-related macular degeneration, right eye, intermediate dry stage: Secondary | ICD-10-CM | POA: Diagnosis not present

## 2017-04-01 DIAGNOSIS — H43811 Vitreous degeneration, right eye: Secondary | ICD-10-CM | POA: Diagnosis not present

## 2017-04-08 ENCOUNTER — Ambulatory Visit
Admission: RE | Admit: 2017-04-08 | Discharge: 2017-04-08 | Disposition: A | Discharge status: Home / Self Care | Payer: Medicare Other | Source: Ambulatory Visit | Attending: Neurosurgery | Admitting: Neurosurgery

## 2017-04-08 DIAGNOSIS — G8220 Paraplegia, unspecified: Secondary | ICD-10-CM

## 2017-04-08 DIAGNOSIS — M48061 Spinal stenosis, lumbar region without neurogenic claudication: Secondary | ICD-10-CM | POA: Diagnosis not present

## 2017-04-22 DIAGNOSIS — M5136 Other intervertebral disc degeneration, lumbar region: Secondary | ICD-10-CM | POA: Diagnosis not present

## 2017-04-22 DIAGNOSIS — G8220 Paraplegia, unspecified: Secondary | ICD-10-CM | POA: Diagnosis not present

## 2017-04-22 DIAGNOSIS — Q068 Other specified congenital malformations of spinal cord: Secondary | ICD-10-CM | POA: Diagnosis not present

## 2017-04-22 DIAGNOSIS — M47816 Spondylosis without myelopathy or radiculopathy, lumbar region: Secondary | ICD-10-CM | POA: Diagnosis not present

## 2017-04-22 DIAGNOSIS — M4155 Other secondary scoliosis, thoracolumbar region: Secondary | ICD-10-CM | POA: Diagnosis not present

## 2017-04-22 DIAGNOSIS — M48062 Spinal stenosis, lumbar region with neurogenic claudication: Secondary | ICD-10-CM | POA: Diagnosis not present

## 2017-04-29 DIAGNOSIS — H353221 Exudative age-related macular degeneration, left eye, with active choroidal neovascularization: Secondary | ICD-10-CM | POA: Diagnosis not present

## 2017-05-14 ENCOUNTER — Encounter: Payer: Self-pay | Admitting: *Deleted

## 2017-05-21 DIAGNOSIS — L84 Corns and callosities: Secondary | ICD-10-CM | POA: Diagnosis not present

## 2017-05-21 DIAGNOSIS — B351 Tinea unguium: Secondary | ICD-10-CM | POA: Diagnosis not present

## 2017-05-21 DIAGNOSIS — E1151 Type 2 diabetes mellitus with diabetic peripheral angiopathy without gangrene: Secondary | ICD-10-CM | POA: Diagnosis not present

## 2017-05-21 DIAGNOSIS — M79676 Pain in unspecified toe(s): Secondary | ICD-10-CM | POA: Diagnosis not present

## 2017-05-25 ENCOUNTER — Ambulatory Visit (INDEPENDENT_AMBULATORY_CARE_PROVIDER_SITE_OTHER): Payer: Medicare Other | Admitting: Cardiology

## 2017-05-25 ENCOUNTER — Encounter: Payer: Self-pay | Admitting: Cardiology

## 2017-05-25 VITALS — BP 130/82 | HR 71 | Ht 68.0 in | Wt 235.0 lb

## 2017-05-25 DIAGNOSIS — N183 Chronic kidney disease, stage 3 unspecified: Secondary | ICD-10-CM

## 2017-05-25 DIAGNOSIS — I10 Essential (primary) hypertension: Secondary | ICD-10-CM

## 2017-05-25 DIAGNOSIS — I2510 Atherosclerotic heart disease of native coronary artery without angina pectoris: Secondary | ICD-10-CM | POA: Diagnosis not present

## 2017-05-25 DIAGNOSIS — I6523 Occlusion and stenosis of bilateral carotid arteries: Secondary | ICD-10-CM | POA: Diagnosis not present

## 2017-05-25 NOTE — Patient Instructions (Signed)

## 2017-05-25 NOTE — Progress Notes (Signed)
Cardiology Office Note    Date:  05/25/2017   ID:  Bobby Lamas., DOB 1930-11-26, MRN [id number]  PCP:  Chipper Herb, MD  Cardiologist:   Candee Furbish, MD     History of Present Illness:  Bobby Duford. is a 84 y.o. male with history of coronary artery disease status post CABG with chronic diastolic heart failure here for follow-up. Prior patient of Dr. Claris Gladden. Echocardiogram in January 2015 showed EF of 50-55% with mildly decreased RV systolic function.  He has had prior gluteal pressure ulcers. Wound care.  He also has chronic kidney disease stage 3stage IV with creatinines ranging from 1.4-1.8.  He was praising his doctors that of taking care of him over the years. He has done quite well. He was in the [employer]. As a child he had mild spinal bifida. He denies any chest pain, syncope, bleeding, orthopnea, PND. He still battles with gluteal wound infections.  PMH: 1. Mild case of spina bifida - [facility] research - [facility] operated on him.  2. Type II diabetes 3. Hyperlipidemia 4. HTN 5. CKD 6. Anemia 7. PTSD 8. CAD: CABG 7/98 (Dr. Redmond Pulling).  Last Myoview in 2/07 showed EF 63%, no ischemia.  9. Gluteal decubitus ulcers 10. Obesity 11. Carotid stenosis: Carotid dopplers (5/13) with 40-59% bilateral ICA stenosis.  Carotid dopplers (1/15) with 40-59% bilateral ICA stenosis. Carotid dopplers (1/17) with mild stenosis only.  12. Diastolic CHF: Echo (6/50) with EF 50-55%, normal RV size with mildly decreased systolic function.   SH: Married, lives in [place], retired from [employer], prior smoker.    Past Medical History:  Diagnosis Date  . Agent orange exposure 1966 or 1971  . Anemia   . Anxiety   . Arthritis    "hands and back" (01/20/2013)  . CAD (coronary artery disease)    native vessel  . Carotid artery disease (HCC)    nonobstructive  . Cataract   . Cecal diverticulitis 2008   drained  . Cellulitis, gluteal    bilateral for the past  6 months/notes 01/20/2013  . Chronic lower back pain   . Chronic renal insufficiency   . Depression   . Diabetes mellitus type II   . Exertional shortness of breath   . HTN (hypertension)   . Hyperlipidemia   . Hypokalemia   . Malnutrition ([place])    protein-calorie  . Myocardial infarction [facility])    "silent; before OHS" (01/20/2013)  . PTSD (post-traumatic stress disorder)   . Spina bifida ([place])   . Urinary incontinence     Past Surgical History:  Procedure Laterality Date  . CARDIAC CATHETERIZATION  1998   "couple before my OHS" (01/20/2013)  . CARPAL TUNNEL RELEASE Right 1980's  . CATARACT EXTRACTION W/ INTRAOCULAR LENS  IMPLANT, BILATERAL Bilateral ~ 2010  . CORONARY ARTERY BYPASS GRAFT  1998   "CABG X4" (01/20/2013)  . CYSTECTOMY  2000's   "cytal cyst on my intestines; probed then drained it; hospitalized for 13 days; NPO" (01/20/2013)  . EYE SURGERY    . HEMORRHOID BANDING  ~ 10/2012  . KNEE SURGERY Left 1964   "exploratory; sewed it up w/wire" (01/20/2013)  . PROSTATE SURGERY    . SPINE SURGERY  [birth year]1932   "Spina bifida surgery"  . STRABISMUS SURGERY Bilateral 03/29/2015   Procedure: REPAIR STRABISMUS BILATERAL;  Surgeon: Lamonte Sakai, MD;  Location: [facility];  Service: Ophthalmology;  Laterality: Bilateral;    Current Medications:  Outpatient Medications Prior to Visit  Medication Sig Dispense Refill  . aspirin 81 MG tablet Take 81 mg by mouth daily.      Marland Kitchen buPROPion (WELLBUTRIN XL) 300 MG 24 hr tablet Take 1 tablet (300 mg total) by mouth daily. 90 tablet 3  . calcitRIOL (ROCALTROL) 0.25 MCG capsule TAKE ONE CAPSULE BY MOUTH ONCE DAILY ON MONDAY, WEDNESDAY, AND FRIDAY  1  . Coenzyme Q10 (CO Q-10) 100 MG CAPS Take 1 capsule by mouth 2 (two) times daily.     . furosemide (LASIX) 20 MG tablet TAKE 1 TABLET DAILY 90 tablet 0  . glipiZIDE (GLUCOTROL XL) 10 MG 24 hr tablet TAKE 1 TABLET DAILY 90 tablet 3  . JANUMET 50-500 MG tablet TAKE 1 TABLET DAILY  WITH FOOD 90 tablet 0  . metoprolol tartrate (LOPRESSOR) 25 MG tablet TAKE 1 TABLET TWICE A DAY 180 tablet 3  . Multiple Vitamins-Minerals (PRESERVISION AREDS 2 PO) Take 1 capsule by mouth 2 (two) times daily.    . nitroGLYCERIN (NITROSTAT) 0.4 MG SL tablet Place 1 tablet (0.4 mg total) under the tongue every 5 (five) minutes as needed. 25 tablet 6  . NON FORMULARY cran-actin 100mg  1 tablet daily    . NON FORMULARY Sweetish Bitters Daily as needed    . Omega-3 Fatty Acids (FISH OIL) 1000 MG CAPS Take 1 capsule by mouth 2 (two) times daily.      . polyethylene glycol powder (GLYCOLAX/MIRALAX) powder Take 17 g by mouth 2 (two) times daily as needed. 850 g 3  . sodium bicarbonate 650 MG tablet Take 650 mg by mouth 2 (two) times daily.   4  . VYTORIN 10-20 MG tablet TAKE 1 TABLET DAILY AS DIRECTED 90 tablet 1   No facility-administered medications prior to visit.      Allergies:   Aspirin; Sulfonamide derivatives; and Nitrofurantoin   Social History   Socioeconomic History  . Marital status: Married    Spouse name: Not on file  . Number of children: Not on file  . Years of education: Not on file  . Highest education level: Not on file  Occupational History  . Occupation: Retired    Comment: Nurse, learning disability  . Financial resource strain: Not on file  . Food insecurity:    Worry: Not on file    Inability: Not on file  . Transportation needs:    Medical: Not on file    Non-medical: Not on file  Tobacco Use  . Smoking status: Former Smoker    Packs/day: 2.00    Years: 20.00    Pack years: 40.00    Types: Cigarettes    Start date: 11/20/1948    Last attempt to quit: 08/19/1980    Years since quitting: 36.7  . Smokeless tobacco: Former Systems developer    Types: Chew  . Tobacco comment: 01/20/2013 "quit chewing 20 yr ago"  Substance and Sexual Activity  . Alcohol use: No    Comment: 01/20/2013 "quit drinking 08/19/1980"  . Drug use: No  . Sexual activity: Not Currently  Lifestyle  .  Physical activity:    Days per week: Not on file    Minutes per session: Not on file  . Stress: Not on file  Relationships  . Social connections:    Talks on phone: Not on file    Gets together: Not on file    Attends religious service: Not on file    Active member of club or organization: Not on  file    Attends meetings of clubs or organizations: Not on file    Relationship status: Not on file  Other Topics Concern  . Not on file  Social History Narrative  . Not on file     Family History:  The patient's family history includes Cancer in his sister and sister; Heart attack in his father and mother; Heart disease in his father and mother.   ROS:   Please see the history of present illness.    Review of Systems  All other systems reviewed and are negative.     PHYSICAL EXAM:   VS:  BP 130/82   Pulse 71   Ht 5\' 8"  (1.727 m)   Wt 235 lb (106.6 kg)   BMI 35.73 kg/m    GEN: Well nourished, well developed, in no acute distress, in a wheelchair.  Braces on legs.  Overweight HEENT: normal  Neck: no JVD, carotid bruits, or masses Cardiac: RRR; no murmurs, rubs, or gallops,no edema  Respiratory:  clear to auscultation bilaterally, normal work of breathing GI: soft, nontender, nondistended, + BS MS: no deformity or atrophy  Skin: warm and dry, no rash Neuro:  Alert and Oriented x 3, Strength and sensation are intact Psych: euthymic mood, full affect   Wt Readings from Last 3 Encounters:  05/25/17 235 lb (106.6 kg)  01/23/17 224 lb (101.6 kg)  09/22/16 218 lb (98.9 kg)      Studies/Labs Reviewed:   EKG:  EKG is ordered today.  05/25/17-sinus rhythm 71, mild sinus arrhythmia, left axis deviation, low voltage, nonspecific ST-T wave flattening.  Personally reviewed.  05/07/16-sinus rhythm 81 with nonspecific ST-T wave flattening, poor R-wave progression, old septal infarct personally viewed  Recent Labs: 01/23/2017: ALT 20; Hemoglobin 13.9; Platelets 228 01/28/2017: BUN 29;  Creatinine, Ser 1.81; Potassium 4.8; Sodium 139   Lipid Panel    Component Value Date/Time   CHOL 139 01/23/2017 1245   CHOL 130 08/19/2012 1044   TRIG 88 01/23/2017 1245   TRIG 139 03/14/2015 1133   TRIG 114 08/19/2012 1044   HDL 50 01/23/2017 1245   HDL 46 03/14/2015 1133   HDL 47 08/19/2012 1044   CHOLHDL 2.8 01/23/2017 1245   CHOLHDL 3.1 CALC 03/24/2007 1113   VLDL 19 03/24/2007 1113   LDLCALC 71 01/23/2017 1245   LDLCALC 66 07/05/2013 1039   LDLCALC 60 08/19/2012 1044    Additional studies/ records that were reviewed today include:  EKG reviewed, lab work reviewed, prior office note reviewed    ASSESSMENT:    1. Atherosclerosis of native coronary artery of native heart without angina pectoris   2. Essential hypertension   3. Bilateral carotid artery stenosis   4. Chronic kidney disease, stage 3 (moderate) (HCC)   5. Morbid obesity ([place])      PLAN:  In order of problems listed above:  Coronary artery disease status post CABG in 1998 without angina  - Stable, no symptoms. Doing well.  - Continuing with secondary prevention.  - Prior nuclear stress test in 2007 showed no ischemia.  - He has not had a cardiac catheterization since surgery.  Continues to do very well without any anginal symptoms.  Carotid stenosis  - Dopplers reviewed-mild, stable. Continue with statin use.  No signs of stroke.  Hyperlipidemia continuing with Vytorin.  LDL 71  - Statin use, heart healthy diet  Chronic diastolic heart failure  - EF 55%, Lasix. NYHA class II symptoms. Overall doing well.  No changes.  CKD3/4 with diabetes  - Dr. Lowanda Foster  - last creat 1.8.  Morbid obesity -BMI greater than 35 with 2 more comorbidities.  Continue to advocate weight loss.  Challenging for him to exercise.   Medication Adjustments/Labs and Tests Ordered: Current medicines are reviewed at length with the patient today.  Concerns regarding medicines are outlined above.  Medication changes, Labs  and Tests ordered today are listed in the Patient Instructions below. Patient Instructions  Medication Instructions:  The current medical regimen is effective;  continue present plan and medications.  Follow-Up: Follow up in 1 year with Dr. Marlou Porch.  You will receive a letter in the mail 2 months before you are due.  Please call us when you receive this letter to schedule your follow up appointment.  If you need a refill on your cardiac medications before your next appointment, please call your pharmacy.  Thank you for choosing [facility]!!        Signed, Candee Furbish, MD  05/25/2017 3:17 PM    [facility] [address], [place], [place]  [address] Phone: [phone]; Fax: [phone]

## 2017-05-29 ENCOUNTER — Ambulatory Visit (INDEPENDENT_AMBULATORY_CARE_PROVIDER_SITE_OTHER): Payer: Medicare Other | Admitting: Family Medicine

## 2017-05-29 ENCOUNTER — Encounter: Payer: Self-pay | Admitting: Family Medicine

## 2017-05-29 ENCOUNTER — Ambulatory Visit (INDEPENDENT_AMBULATORY_CARE_PROVIDER_SITE_OTHER): Payer: Medicare Other

## 2017-05-29 VITALS — BP 130/64 | HR 66 | Temp 96.9°F | Ht 68.0 in | Wt 235.0 lb

## 2017-05-29 DIAGNOSIS — E875 Hyperkalemia: Secondary | ICD-10-CM | POA: Diagnosis not present

## 2017-05-29 DIAGNOSIS — I10 Essential (primary) hypertension: Secondary | ICD-10-CM

## 2017-05-29 DIAGNOSIS — R531 Weakness: Secondary | ICD-10-CM

## 2017-05-29 DIAGNOSIS — E1169 Type 2 diabetes mellitus with other specified complication: Secondary | ICD-10-CM | POA: Diagnosis not present

## 2017-05-29 DIAGNOSIS — I739 Peripheral vascular disease, unspecified: Secondary | ICD-10-CM | POA: Diagnosis not present

## 2017-05-29 DIAGNOSIS — N183 Chronic kidney disease, stage 3 unspecified: Secondary | ICD-10-CM

## 2017-05-29 DIAGNOSIS — I5032 Chronic diastolic (congestive) heart failure: Secondary | ICD-10-CM | POA: Diagnosis not present

## 2017-05-29 DIAGNOSIS — Q057 Lumbar spina bifida without hydrocephalus: Secondary | ICD-10-CM

## 2017-05-29 DIAGNOSIS — E7800 Pure hypercholesterolemia, unspecified: Secondary | ICD-10-CM

## 2017-05-29 DIAGNOSIS — E1122 Type 2 diabetes mellitus with diabetic chronic kidney disease: Secondary | ICD-10-CM

## 2017-05-29 DIAGNOSIS — E559 Vitamin D deficiency, unspecified: Secondary | ICD-10-CM | POA: Diagnosis not present

## 2017-05-29 DIAGNOSIS — I6523 Occlusion and stenosis of bilateral carotid arteries: Secondary | ICD-10-CM

## 2017-05-29 DIAGNOSIS — D692 Other nonthrombocytopenic purpura: Secondary | ICD-10-CM

## 2017-05-29 DIAGNOSIS — E1121 Type 2 diabetes mellitus with diabetic nephropathy: Secondary | ICD-10-CM

## 2017-05-29 DIAGNOSIS — E785 Hyperlipidemia, unspecified: Secondary | ICD-10-CM

## 2017-05-29 LAB — BAYER DCA HB A1C WAIVED: HB A1C (BAYER DCA - WAIVED): 7.6 % — ABNORMAL HIGH (ref <7.0)

## 2017-05-29 NOTE — Patient Instructions (Addendum)
Medicare Annual Wellness Visit  [facility] and the medical providers at [facility] strive to bring you the best medical care.  In doing so we not only want to address your current medical conditions and concerns but also to detect new conditions early and prevent illness, disease and health-related problems.    Medicare offers a yearly Wellness Visit which allows our clinical staff to assess your need for preventative services including immunizations, lifestyle education, counseling to decrease risk of preventable diseases and screening for fall risk and other medical concerns.    This visit is provided free of charge (no copay) for all Medicare recipients. The clinical pharmacists at [facility] have begun to conduct these Wellness Visits which will also include a thorough review of all your medications.    As you primary medical provider recommend that you make an appointment for your Annual Wellness Visit if you have not done so already this year.  You may set up this appointment before you leave today or you may call back ([phone]) and schedule an appointment.  Please make sure when you call that you mention that you are scheduling your Annual Wellness Visit with the clinical pharmacist so that the appointment may be made for the proper length of time.     Continue current medications. Continue good therapeutic lifestyle changes which include good diet and exercise. Fall precautions discussed with patient. If an FOBT was given today- please return it to our front desk. If you are over 64 years old - you may need Prevnar 16 or the adult Pneumonia vaccine.  **Flu shots are available--- please call and schedule a FLU-CLINIC appointment**  After your visit with [place] today you will receive a survey in the mail or online from [employer] regarding your care with [place]. Please take a moment to fill this out. Your feedback is very  important to [place] as you can help [place] better understand your patient needs as well as improve your experience and satisfaction. WE CARE ABOUT YOU!!!   Continue to follow-up as planned with the cardiologist Continue to follow-up as needed with the neurosurgeon Continue with good diabetic control Monitor blood pressures regularly at home Monitor sacral decubiti regularly at home  Continue current medical treatment pending results of lab work

## 2017-05-29 NOTE — Progress Notes (Signed)
Subjective:    Patient ID: [name]., male    DOB: Jul 29, 1930, 84 y.o.   MRN: [id number]  HPI Pt here for follow up and management of chronic medical problems which includes diabetes, hyperlipidemia and hypertension. He is taking medication regularly.  Patient has had a recent fall and is unable to stand.  He does have a left arm lesion and some trouble sleeping.  He is due to get a rectal exam but we will defer that due to some problems with ulcers.  He is given another FOBT to return and will get a chest x-ray and lab work today.  He did see the neurosurgeon.  The note from the neurosurgeon was reviewed and it was felt that further surgical intervention may be not very helpful.  He did see Dr. Sherwood Gambler.  Dr. Sherwood Gambler feels that his current deficit is probably permanent and irreversible but that surgical intervention could slow the development of neurologic deficit but that there was no assurance of that.  No definite return appointment was given for the patient to see the neurosurgeon.  The patient today is pleasant and calm.  He says he is just not sleeping well.  He is not tried any Tylenol and will go back and try this as this has helped him in the past.  He would prefer not to take any strong agents and I certainly agree with that.  He will get back in touch with [place] if this continues.  He denies any chest pain or shortness of breath.  He denies any problems with passing his bowels other than ongoing constipation for which he uses MiraLAX.  He also continues to have his urinary incontinence.  He has had some buttock skin breakdown and his wife is working aggressively to get a hold on this and they seem to be able to do this and he will get back in touch with [place] if this progresses or gets worse.  The patient is having more problems with transportation to multiple specialists which includes cardiology podiatry urology nephrology and neurosurgery.  His wife is older and she is having trouble  handling him and getting him in to a vehicle comfortably without injuring him or injuring her.  He definitely needs some type of assistance to make these trips more accommodating for himself.  He will look into the option of getting a handicap Lucianne Lei that can help him.  He certainly deserves this.    Patient Active Problem List   Diagnosis Date Noted  . Senile purpura ([place]) 09/22/2016  . General weakness 09/22/2016  . Bilateral carpal tunnel syndrome 09/22/2016  . Leukocytosis 07/30/2015  . Decubitus ulcer, buttock 10/31/2014  . Chronic diastolic CHF (congestive heart failure) ([place]) 09/22/2013  . Constipation 07/05/2013  . Cellulitis 01/19/2013  . Abscess or cellulitis of gluteal region 01/19/2013  . UTI (lower urinary tract infection) 01/19/2013  . Urinary incontinence 12/13/2012  . External hemorrhoids with complication 99/83/3825  . Chronic constipation 11/29/2012  . Metabolic syndrome 05/39/7673  . Hemorrhoid prolapse 11/29/2012  . Spina bifida ([place]) 11/18/2012  . CAD, NATIVE VESSEL 03/08/2009  . Type 2 diabetes mellitus with diabetic nephropathy ([place]) 10/29/2008  . MALNUTRITION, PROTEIN-CALORIE 10/29/2008  . Type 2 diabetes mellitus with stage 3 chronic kidney disease, without long-term current use of insulin ([place]) 10/29/2008  . HYPOKALEMIA 10/29/2008  . ANEMIA 10/29/2008  . PTSD 10/29/2008  . Essential hypertension 10/29/2008  . Chronic kidney disease, stage IV (severe) ([place]) 10/29/2008   Outpatient  Encounter Medications as of 05/29/2017  Medication Sig  . aspirin 81 MG tablet Take 81 mg by mouth daily.    Marland Kitchen buPROPion (WELLBUTRIN XL) 300 MG 24 hr tablet Take 1 tablet (300 mg total) by mouth daily.  . calcitRIOL (ROCALTROL) 0.25 MCG capsule TAKE ONE CAPSULE BY MOUTH ONCE DAILY ON MONDAY, WEDNESDAY, AND FRIDAY  . Coenzyme Q10 (CO Q-10) 100 MG CAPS Take 1 capsule by mouth 2 (two) times daily.   . furosemide (LASIX) 20 MG tablet TAKE 1 TABLET DAILY  . glipiZIDE (GLUCOTROL XL) 10  MG 24 hr tablet TAKE 1 TABLET DAILY  . JANUMET 50-500 MG tablet TAKE 1 TABLET DAILY WITH FOOD  . metoprolol tartrate (LOPRESSOR) 25 MG tablet TAKE 1 TABLET TWICE A DAY  . Multiple Vitamins-Minerals (PRESERVISION AREDS 2 PO) Take 1 capsule by mouth 2 (two) times daily.  . nitroGLYCERIN (NITROSTAT) 0.4 MG SL tablet Place 1 tablet (0.4 mg total) under the tongue every 5 (five) minutes as needed.  . NON FORMULARY cran-actin 189m 1 tablet daily  . NON FORMULARY Sweetish Bitters Daily as needed  . Omega-3 Fatty Acids (FISH OIL) 1000 MG CAPS Take 1 capsule by mouth 2 (two) times daily.    . polyethylene glycol powder (GLYCOLAX/MIRALAX) powder Take 17 g by mouth 2 (two) times daily as needed.  . sodium bicarbonate 650 MG tablet Take 650 mg by mouth 2 (two) times daily.   .Marland KitchenVYTORIN 10-20 MG tablet TAKE 1 TABLET DAILY AS DIRECTED   No facility-administered encounter medications on file as of 05/29/2017.       Review of Systems  Constitutional: Negative.        Some sleep issues  HENT: Negative.   Eyes: Negative.   Respiratory: Negative.   Cardiovascular: Negative.   Gastrointestinal: Negative.   Endocrine: Negative.   Genitourinary: Negative.   Musculoskeletal: Negative.        Leg weakness / can't stand on his own // right foot turning inward  Skin: Negative.        Left lower arm lesion - getting larger  Allergic/Immunologic: Negative.   Hematological: Negative.  Adenopathy: general weakness.  Psychiatric/Behavioral: Negative.        Objective:   Physical Exam  Constitutional: He is oriented to person, place, and time. He appears well-developed and well-nourished. No distress.  The patient is pleasant and alert and appears much younger than his stated age of 84years.  He may be a little bit down today but is accepting of the information from the neurosurgeon and the ultimate outcome of care in a nursing facility.  I think he is inclined to believe that he would prefer not to do any  additional surgery at this time.  HENT:  Head: Normocephalic and atraumatic.  Right Ear: External ear normal.  Left Ear: External ear normal.  Nose: Nose normal.  Mouth/Throat: Oropharynx is clear and moist. No oropharyngeal exudate.  Eyes: Pupils are equal, round, and reactive to light. Conjunctivae and EOM are normal. Right eye exhibits no discharge. Left eye exhibits no discharge. No scleral icterus.  Neck: Normal range of motion. Neck supple. No thyromegaly present.  No bruits thyromegaly or anterior cervical adenopathy  Cardiovascular: Normal rate, regular rhythm and normal heart sounds.  No murmur heard. The heart is regular at 72/min  Pulmonary/Chest: Effort normal and breath sounds normal. No respiratory distress. He has no wheezes. He has no rales. He exhibits no tenderness.  Clear anteriorly and posteriorly  Abdominal: Soft.  Bowel sounds are normal. He exhibits no mass. There is no tenderness. There is no rebound and no guarding.  No abdominal tenderness masses organ enlargement or suprapubic tenderness.  Patient is wearing depends because of spina bifida and lack of bladder control.  Genitourinary:  Genitourinary Comments: This exam will be postponed until the next visit.  Musculoskeletal: He exhibits no edema, tenderness or deformity.  Patient is in wheelchair and because of recent falls is unable to stand currently.  Lymphadenopathy:    He has no cervical adenopathy.  Neurological: He is alert and oriented to person, place, and time.  Diminished reflexes lower extremities  Skin: Skin is warm and dry. No rash noted.  Psychiatric: He has a normal mood and affect. His behavior is normal. Judgment and thought content normal.  Nursing note and vitals reviewed.   BP 130/64 (BP Location: Left Arm)   Pulse 66   Temp (!) 96.9 F (36.1 C) (Oral)   Ht 5' 8" (1.727 m)   Wt 235 lb (106.6 kg)   BMI 35.73 kg/m   No rectal exam done today at patient's request     Assessment &  Plan:  1. Pure hypercholesterolemia -Continue with current treatment pending results of lab work - [employer]; Future - CBC with Differential/Platelet - Lipid panel  2. Type 2 diabetes mellitus with diabetic nephropathy, without long-term current use of insulin (H[facility]) -Continue to watch diet closely drink plenty of water and monitor blood sugars closely and no changes until lab work is resulted - Microalbumin / creatinine urine ratio - CBC with Differential/Platelet - Bayer DCA Hb A1c Waived  3. Vitamin D deficiency -Continue current treatment - CBC with Differential/Platelet - VITAMIN D 25 Hydroxy (Vit-D Deficiency, Fractures)  4. Essential hypertension -The blood pressure is good today and he will continue with current treatment - DG Chest 2 View; Future - BMP8+EGFR - CBC with Differential/Platelet - Hepatic function panel  5. Chronic diastolic CHF (congestive heart failure) ([place]) -Continue regular follow-up with cardiology - DG Chest 2 View; Future - BMP8+EGFR - CBC with Differential/Platelet  6. General weakness -Stay as active as possible being careful not to fall - CBC with Differential/Platelet  7. Spina bifida of lumbar region without hydrocephalus [facility]) -Follow-up with neurosurgery as needed  8. Peripheral vascular insufficiency ([place]) -Stay as active as possible  9. Senile purpura ([facility]) -No bleeding or bruising issues noted today  10. Serum potassium elevated -BMP is pending  11. Type 2 diabetes mellitus with stage 3 chronic kidney disease, without long-term current use of insulin (HCC) -Recent blood sugars have been more elevated.  12. Type 2 diabetes mellitus with hyperlipidemia ([place]) -Continue with current treatment pending results of lab work   Patient Instructions                       Medicare Annual Wellness Visit  [facility] and the medical providers at [facility] strive to bring you the best medical care.  In  doing so we not only want to address your current medical conditions and concerns but also to detect new conditions early and prevent illness, disease and health-related problems.    Medicare offers a yearly Wellness Visit which allows our clinical staff to assess your need for preventative services including immunizations, lifestyle education, counseling to decrease risk of preventable diseases and screening for fall risk and other medical concerns.    This visit is provided free of charge (no  copay) for all Medicare recipients. The clinical pharmacists at [facility] have begun to conduct these Wellness Visits which will also include a thorough review of all your medications.    As you primary medical provider recommend that you make an appointment for your Annual Wellness Visit if you have not done so already this year.  You may set up this appointment before you leave today or you may call back ([phone]) and schedule an appointment.  Please make sure when you call that you mention that you are scheduling your Annual Wellness Visit with the clinical pharmacist so that the appointment may be made for the proper length of time.     Continue current medications. Continue good therapeutic lifestyle changes which include good diet and exercise. Fall precautions discussed with patient. If an FOBT was given today- please return it to our front desk. If you are over 71 years old - you may need Prevnar 45 or the adult Pneumonia vaccine.  **Flu shots are available--- please call and schedule a FLU-CLINIC appointment**  After your visit with [place] today you will receive a survey in the mail or online from [employer] regarding your care with [place]. Please take a moment to fill this out. Your feedback is very important to [place] as you can help [place] better understand your patient needs as well as improve your experience and satisfaction. WE CARE ABOUT YOU!!!   Continue to follow-up as planned  with the cardiologist Continue to follow-up as needed with the neurosurgeon Continue with good diabetic control Monitor blood pressures regularly at home Monitor sacral decubiti regularly at home  Continue current medical treatment pending results of lab work  Arrie Senate MD

## 2017-05-30 LAB — CBC WITH DIFFERENTIAL/PLATELET
BASOS ABS: 0 10*3/uL (ref 0.0–0.2)
BASOS: 0 %
EOS (ABSOLUTE): 0.1 10*3/uL (ref 0.0–0.4)
Eos: 1 %
Hematocrit: 39.8 % (ref 37.5–51.0)
Hemoglobin: 13.4 g/dL (ref 13.0–17.7)
IMMATURE GRANS (ABS): 0 10*3/uL (ref 0.0–0.1)
Immature Granulocytes: 0 %
LYMPHS ABS: 2.5 10*3/uL (ref 0.7–3.1)
LYMPHS: 20 %
MCH: 30.2 pg (ref 26.6–33.0)
MCHC: 33.7 g/dL (ref 31.5–35.7)
MCV: 90 fL (ref 79–97)
MONOS ABS: 1.2 10*3/uL — AB (ref 0.1–0.9)
Monocytes: 9 %
NEUTROS ABS: 8.8 10*3/uL — AB (ref 1.4–7.0)
Neutrophils: 70 %
PLATELETS: 263 10*3/uL (ref 150–379)
RBC: 4.44 x10E6/uL (ref 4.14–5.80)
RDW: 14.5 % (ref 12.3–15.4)
WBC: 12.6 10*3/uL — ABNORMAL HIGH (ref 3.4–10.8)

## 2017-05-30 LAB — BMP8+EGFR
BUN/Creatinine Ratio: 18 (ref 10–24)
BUN: 29 mg/dL — ABNORMAL HIGH (ref 8–27)
CALCIUM: 9.1 mg/dL (ref 8.6–10.2)
CHLORIDE: 100 mmol/L (ref 96–106)
CO2: 24 mmol/L (ref 20–29)
Creatinine, Ser: 1.65 mg/dL — ABNORMAL HIGH (ref 0.76–1.27)
GFR, EST AFRICAN AMERICAN: 43 mL/min/{1.73_m2} — AB (ref >59)
GFR, EST NON AFRICAN AMERICAN: 38 mL/min/{1.73_m2} — AB (ref >59)
Glucose: 146 mg/dL — ABNORMAL HIGH (ref 65–99)
POTASSIUM: 4.9 mmol/L (ref 3.5–5.2)
SODIUM: 140 mmol/L (ref 134–144)

## 2017-05-30 LAB — LIPID PANEL
CHOLESTEROL TOTAL: 139 mg/dL (ref 100–199)
Chol/HDL Ratio: 3.5 ratio (ref 0.0–5.0)
HDL: 40 mg/dL (ref >39)
LDL Calculated: 78 mg/dL (ref 0–99)
Triglycerides: 107 mg/dL (ref 0–149)
VLDL CHOLESTEROL CAL: 21 mg/dL (ref 5–40)

## 2017-05-30 LAB — HEPATIC FUNCTION PANEL
ALT: 14 IU/L (ref 0–44)
AST: 13 IU/L (ref 0–40)
Albumin: 3.8 g/dL (ref 3.5–4.7)
Alkaline Phosphatase: 93 IU/L (ref 39–117)
BILIRUBIN TOTAL: 0.4 mg/dL (ref 0.0–1.2)
BILIRUBIN, DIRECT: 0.14 mg/dL (ref 0.00–0.40)
TOTAL PROTEIN: 6.5 g/dL (ref 6.0–8.5)

## 2017-05-30 LAB — MICROALBUMIN / CREATININE URINE RATIO
CREATININE, UR: 40.7 mg/dL
MICROALBUM., U, RANDOM: 60.5 ug/mL
Microalb/Creat Ratio: 148.6 mg/g creat — ABNORMAL HIGH (ref 0.0–30.0)

## 2017-05-30 LAB — VITAMIN D 25 HYDROXY (VIT D DEFICIENCY, FRACTURES): VIT D 25 HYDROXY: 37.2 ng/mL (ref 30.0–100.0)

## 2017-06-03 DIAGNOSIS — H353221 Exudative age-related macular degeneration, left eye, with active choroidal neovascularization: Secondary | ICD-10-CM | POA: Diagnosis not present

## 2017-06-04 ENCOUNTER — Telehealth: Payer: Self-pay | Admitting: Family Medicine

## 2017-06-05 NOTE — Telephone Encounter (Signed)
Pt aware - of CXR

## 2017-06-09 ENCOUNTER — Other Ambulatory Visit: Payer: Self-pay | Admitting: Family Medicine

## 2017-06-10 ENCOUNTER — Other Ambulatory Visit: Payer: Self-pay | Admitting: Family Medicine

## 2017-06-16 ENCOUNTER — Telehealth: Payer: Self-pay | Admitting: Family Medicine

## 2017-06-16 ENCOUNTER — Other Ambulatory Visit: Payer: Medicare Other

## 2017-06-16 DIAGNOSIS — R300 Dysuria: Secondary | ICD-10-CM

## 2017-06-16 LAB — MICROSCOPIC EXAMINATION: RENAL EPITHEL UA: NONE SEEN /HPF

## 2017-06-16 LAB — URINALYSIS, COMPLETE
BILIRUBIN UA: NEGATIVE
Glucose, UA: NEGATIVE
Ketones, UA: NEGATIVE
NITRITE UA: POSITIVE — AB
PH UA: 7 (ref 5.0–7.5)
Specific Gravity, UA: 1.015 (ref 1.005–1.030)
UUROB: 0.2 mg/dL (ref 0.2–1.0)

## 2017-06-17 DIAGNOSIS — D509 Iron deficiency anemia, unspecified: Secondary | ICD-10-CM | POA: Diagnosis not present

## 2017-06-17 DIAGNOSIS — I10 Essential (primary) hypertension: Secondary | ICD-10-CM | POA: Diagnosis not present

## 2017-06-17 DIAGNOSIS — E559 Vitamin D deficiency, unspecified: Secondary | ICD-10-CM | POA: Diagnosis not present

## 2017-06-17 DIAGNOSIS — R809 Proteinuria, unspecified: Secondary | ICD-10-CM | POA: Diagnosis not present

## 2017-06-17 DIAGNOSIS — N183 Chronic kidney disease, stage 3 (moderate): Secondary | ICD-10-CM | POA: Diagnosis not present

## 2017-06-17 DIAGNOSIS — Z79899 Other long term (current) drug therapy: Secondary | ICD-10-CM | POA: Diagnosis not present

## 2017-06-18 ENCOUNTER — Telehealth: Payer: Self-pay | Admitting: Family Medicine

## 2017-06-18 LAB — URINE CULTURE

## 2017-06-18 NOTE — Telephone Encounter (Signed)
Pt aware urine culture results pending and probably won't be back until in the morning and rx written for Dr Laurance Flatten to sign when he returns back to the office.

## 2017-06-19 ENCOUNTER — Other Ambulatory Visit: Payer: Self-pay | Admitting: *Deleted

## 2017-06-19 MED ORDER — DOXYCYCLINE HYCLATE 100 MG PO TABS: 100.0000 mg | ORAL_TABLET | Freq: Two times a day (BID) | ORAL | 0 refills | Status: DC

## 2017-07-08 DIAGNOSIS — H353221 Exudative age-related macular degeneration, left eye, with active choroidal neovascularization: Secondary | ICD-10-CM | POA: Diagnosis not present

## 2017-07-14 DIAGNOSIS — R809 Proteinuria, unspecified: Secondary | ICD-10-CM | POA: Diagnosis not present

## 2017-07-14 DIAGNOSIS — E1129 Type 2 diabetes mellitus with other diabetic kidney complication: Secondary | ICD-10-CM | POA: Diagnosis not present

## 2017-07-14 DIAGNOSIS — I10 Essential (primary) hypertension: Secondary | ICD-10-CM | POA: Diagnosis not present

## 2017-07-14 DIAGNOSIS — N183 Chronic kidney disease, stage 3 (moderate): Secondary | ICD-10-CM | POA: Diagnosis not present

## 2017-07-15 ENCOUNTER — Telehealth: Payer: Self-pay | Admitting: Family Medicine

## 2017-07-15 NOTE — Telephone Encounter (Signed)
Pt called - he already has appt for DM shoe fitting

## 2017-08-12 DIAGNOSIS — E119 Type 2 diabetes mellitus without complications: Secondary | ICD-10-CM | POA: Diagnosis not present

## 2017-08-12 DIAGNOSIS — H353221 Exudative age-related macular degeneration, left eye, with active choroidal neovascularization: Secondary | ICD-10-CM | POA: Diagnosis not present

## 2017-08-20 DIAGNOSIS — H353231 Exudative age-related macular degeneration, bilateral, with active choroidal neovascularization: Secondary | ICD-10-CM | POA: Diagnosis not present

## 2017-09-01 DIAGNOSIS — H52203 Unspecified astigmatism, bilateral: Secondary | ICD-10-CM | POA: Diagnosis not present

## 2017-09-01 DIAGNOSIS — H26492 Other secondary cataract, left eye: Secondary | ICD-10-CM | POA: Diagnosis not present

## 2017-09-01 DIAGNOSIS — E119 Type 2 diabetes mellitus without complications: Secondary | ICD-10-CM | POA: Diagnosis not present

## 2017-09-01 LAB — HM DIABETES EYE EXAM

## 2017-09-08 ENCOUNTER — Telehealth: Payer: Self-pay | Admitting: Family Medicine

## 2017-09-08 NOTE — Telephone Encounter (Signed)
Patient needed an appt for a power chair. Gave first available appt.

## 2017-09-08 NOTE — Telephone Encounter (Signed)
Pt aware - appt

## 2017-09-10 ENCOUNTER — Other Ambulatory Visit: Payer: Self-pay | Admitting: Family Medicine

## 2017-09-10 MED ORDER — DOXYCYCLINE HYCLATE 100 MG PO TABS: 100.0000 mg | ORAL_TABLET | Freq: Two times a day (BID) | ORAL | 0 refills | Status: DC

## 2017-09-10 NOTE — Telephone Encounter (Signed)
Pt called

## 2017-09-16 DIAGNOSIS — H353221 Exudative age-related macular degeneration, left eye, with active choroidal neovascularization: Secondary | ICD-10-CM | POA: Diagnosis not present

## 2017-09-16 DIAGNOSIS — H353211 Exudative age-related macular degeneration, right eye, with active choroidal neovascularization: Secondary | ICD-10-CM | POA: Diagnosis not present

## 2017-09-28 ENCOUNTER — Other Ambulatory Visit: Payer: Self-pay | Admitting: Family Medicine

## 2017-09-29 NOTE — Telephone Encounter (Signed)
Last seen 05/29/17  DWM

## 2017-10-01 ENCOUNTER — Encounter: Payer: Self-pay | Admitting: Family Medicine

## 2017-10-01 ENCOUNTER — Ambulatory Visit (INDEPENDENT_AMBULATORY_CARE_PROVIDER_SITE_OTHER): Payer: Medicare Other | Admitting: Family Medicine

## 2017-10-01 VITALS — BP 127/60 | HR 70 | Temp 97.8°F | Ht 68.0 in | Wt 240.0 lb

## 2017-10-01 DIAGNOSIS — I6523 Occlusion and stenosis of bilateral carotid arteries: Secondary | ICD-10-CM

## 2017-10-01 DIAGNOSIS — E1121 Type 2 diabetes mellitus with diabetic nephropathy: Secondary | ICD-10-CM

## 2017-10-01 DIAGNOSIS — E559 Vitamin D deficiency, unspecified: Secondary | ICD-10-CM | POA: Diagnosis not present

## 2017-10-01 DIAGNOSIS — Z7409 Other reduced mobility: Secondary | ICD-10-CM | POA: Diagnosis not present

## 2017-10-01 DIAGNOSIS — Q057 Lumbar spina bifida without hydrocephalus: Secondary | ICD-10-CM | POA: Diagnosis not present

## 2017-10-01 DIAGNOSIS — I10 Essential (primary) hypertension: Secondary | ICD-10-CM

## 2017-10-01 DIAGNOSIS — I5032 Chronic diastolic (congestive) heart failure: Secondary | ICD-10-CM | POA: Diagnosis not present

## 2017-10-01 DIAGNOSIS — R6889 Other general symptoms and signs: Secondary | ICD-10-CM | POA: Diagnosis not present

## 2017-10-01 DIAGNOSIS — E7800 Pure hypercholesterolemia, unspecified: Secondary | ICD-10-CM | POA: Diagnosis not present

## 2017-10-01 DIAGNOSIS — Z87440 Personal history of urinary (tract) infections: Secondary | ICD-10-CM | POA: Diagnosis not present

## 2017-10-01 DIAGNOSIS — R2681 Unsteadiness on feet: Secondary | ICD-10-CM | POA: Diagnosis not present

## 2017-10-01 DIAGNOSIS — R531 Weakness: Secondary | ICD-10-CM

## 2017-10-01 NOTE — Patient Instructions (Addendum)
Medicare Annual Wellness Visit  [facility] and the medical providers at [facility] strive to bring you the best medical care.  In doing so we not only want to address your current medical conditions and concerns but also to detect new conditions early and prevent illness, disease and health-related problems.    Medicare offers a yearly Wellness Visit which allows our clinical staff to assess your need for preventative services including immunizations, lifestyle education, counseling to decrease risk of preventable diseases and screening for fall risk and other medical concerns.    This visit is provided free of charge (no copay) for all Medicare recipients. The clinical pharmacists at [facility] have begun to conduct these Wellness Visits which will also include a thorough review of all your medications.    As you primary medical provider recommend that you make an appointment for your Annual Wellness Visit if you have not done so already this year.  You may set up this appointment before you leave today or you may call back ([phone]) and schedule an appointment.  Please make sure when you call that you mention that you are scheduling your Annual Wellness Visit with the clinical pharmacist so that the appointment may be made for the proper length of time.     Continue current medications. Continue good therapeutic lifestyle changes which include good diet and exercise. Fall precautions discussed with patient. If an FOBT was given today- please return it to our front desk. If you are over 76 years old - you may need Prevnar 63 or the adult Pneumonia vaccine.  **Flu shots are available--- please call and schedule a FLU-CLINIC appointment**  After your visit with [place] today you will receive a survey in the mail or online from [employer] regarding your care with [place]. Please take a moment to fill this out. Your feedback is very  important to [place] as you can help [place] better understand your patient needs as well as improve your experience and satisfaction. WE CARE ABOUT YOU!!!   We will complete the forms for the motorized wheelchair The patient will get back in touch with [place] in the next 3 to 4 weeks to make sure everything has been completed and that the forms have been mailed and hopefully we can get approval for the wheelchair at that time. He should continue to follow-up with the prosthetic person to make sure that his new brace on the right foot is adjusted properly without any skin breakdown He should check his toes daily to make sure there is no sign of any infection.

## 2017-10-01 NOTE — Progress Notes (Signed)
Subjective:    Patient ID: [name]., male    DOB: [birth year], 84 y.o.   MRN: [id number]  HPI Pt here for follow up and management of chronic medical problems which includes diabetes, hypertension and hyperlipidemia. He is taking medication regularly. He is also here today in part for a face to face visit for a motorized wheelchair.  This patient is wheelchair confined because of his spina bifida which is getting worse.  He is unable to motivate with a regular wheelchair as much and needs motorized capability to get around better even in his home or if he goes out to the doctor's office.  This patient has a history of having had a CABG he has chronic decubiti on his buttocks.  He also has arthritis.  He has heart disease.  This patient has had spina bifida for many years.  He is currently using a manual wheelchair.  It is difficult to operate and moved by his caregiver.  He is unable to get around easily without having to have constant assistance using the manual wheelchair.  It diminishes his independence when he cannot have someone assisting him.  He is unable to use a cane or walker to meet his mobility needs because he wears braces on both feet and 1 of his foot is pronated inward.  There is increased risk of falling with using a cane or walker because of the weakness and spasms in his lower extremities.  He cannot use the manual wheelchair because he is also weak in his upper extremities and cannot move around as freely and independently as he could with a mobile wheelchair.  The patient does not have any physical or mental abilities to keep him from operating a motorized wheelchair at home.  He is certainly able to do this with his hands.  When he tries to walk he can only walk with assistance and no more than 15 steps at a time.  He is unable to get in and out of vehicles easily with a manual wheelchair.     Patient Active Problem List   Diagnosis Date Noted  . Senile purpura ([place])  09/22/2016  . General weakness 09/22/2016  . Bilateral carpal tunnel syndrome 09/22/2016  . Leukocytosis 07/30/2015  . Decubitus ulcer, buttock 10/31/2014  . Chronic diastolic CHF (congestive heart failure) ([place]) 09/22/2013  . Constipation 07/05/2013  . Cellulitis 01/19/2013  . Abscess or cellulitis of gluteal region 01/19/2013  . UTI (lower urinary tract infection) 01/19/2013  . Urinary incontinence 12/13/2012  . External hemorrhoids with complication 26/33/3545  . Chronic constipation 11/29/2012  . Metabolic syndrome 62/56/3893  . Hemorrhoid prolapse 11/29/2012  . Spina bifida ([place]) 11/18/2012  . CAD, NATIVE VESSEL 03/08/2009  . Type 2 diabetes mellitus with diabetic nephropathy ([place]) 10/29/2008  . MALNUTRITION, PROTEIN-CALORIE 10/29/2008  . Type 2 diabetes mellitus with stage 3 chronic kidney disease, without long-term current use of insulin ([place]) 10/29/2008  . HYPOKALEMIA 10/29/2008  . ANEMIA 10/29/2008  . PTSD 10/29/2008  . Essential hypertension 10/29/2008  . Chronic kidney disease, stage IV (severe) ([place]) 10/29/2008   Outpatient Encounter Medications as of 10/01/2017  Medication Sig  . aspirin 81 MG tablet Take 81 mg by mouth daily.    Marland Kitchen buPROPion (WELLBUTRIN XL) 300 MG 24 hr tablet Take 1 tablet (300 mg total) by mouth daily.  . calcitRIOL (ROCALTROL) 0.25 MCG capsule TAKE ONE CAPSULE BY MOUTH ONCE DAILY ON MONDAY, WEDNESDAY, AND FRIDAY  . Coenzyme Q10 (CO Q-10)  100 MG CAPS Take 1 capsule by mouth 2 (two) times daily.   Marland Kitchen doxycycline (VIBRA-TABS) 100 MG tablet Take 1 tablet (100 mg total) by mouth 2 (two) times daily.  . furosemide (LASIX) 20 MG tablet TAKE 1 TABLET DAILY  . glipiZIDE (GLUCOTROL XL) 10 MG 24 hr tablet TAKE 1 TABLET DAILY  . JANUMET 50-500 MG tablet TAKE 1 TABLET DAILY WITH FOOD  . metoprolol tartrate (LOPRESSOR) 25 MG tablet TAKE 1 TABLET TWICE A DAY  . Multiple Vitamins-Minerals (PRESERVISION AREDS 2 PO) Take 1 capsule by mouth 2 (two) times daily.  .  nitroGLYCERIN (NITROSTAT) 0.4 MG SL tablet Place 1 tablet (0.4 mg total) under the tongue every 5 (five) minutes as needed.  . NON FORMULARY cran-actin 149m 1 tablet daily  . NON FORMULARY Sweetish Bitters Daily as needed  . Omega-3 Fatty Acids (FISH OIL) 1000 MG CAPS Take 1 capsule by mouth 2 (two) times daily.    . polyethylene glycol powder (GLYCOLAX/MIRALAX) powder Take 17 g by mouth 2 (two) times daily as needed.  . sodium bicarbonate 650 MG tablet Take 650 mg by mouth 2 (two) times daily.   .Marland KitchenVYTORIN 10-20 MG tablet TAKE 1 TABLET DAILY AS DIRECTED   No facility-administered encounter medications on file as of 10/01/2017.      Review of Systems  Constitutional: Negative.   HENT: Negative.   Eyes: Negative.   Respiratory: Negative.   Cardiovascular: Negative.   Gastrointestinal: Negative.   Endocrine: Negative.   Genitourinary: Negative.   Musculoskeletal: Negative.   Skin: Negative.   Allergic/Immunologic: Negative.   Neurological: Positive for weakness.  Hematological: Negative.   Psychiatric/Behavioral: Negative.        Objective:   Physical Exam  Constitutional: He is oriented to person, place, and time. He appears well-developed and well-nourished. No distress.  The patient is kind and alert and in his manual wheelchair with assistance here in the office today.  HENT:  Head: Normocephalic and atraumatic.  Right Ear: External ear normal.  Left Ear: External ear normal.  Nose: Nose normal.  Mouth/Throat: Oropharynx is clear and moist. No oropharyngeal exudate.  Eyes: Pupils are equal, round, and reactive to light. Conjunctivae and EOM are normal. Right eye exhibits no discharge. Left eye exhibits no discharge. No scleral icterus.  Neck: Normal range of motion. Neck supple. No thyromegaly present.  Cardiovascular: Normal rate, regular rhythm and normal heart sounds.  No murmur heard. Heart is regular today at 72/min distal pulses were not palpable.  Pulmonary/Chest:  Effort normal and breath sounds normal. He has no wheezes. He has no rales.  Clear anteriorly and posteriorly  Abdominal: Soft. Bowel sounds are normal. He exhibits no mass. There is no tenderness. There is no rebound.  No abdominal tenderness masses or bruits.  Patient wears a pad because of incontinence from the spina bifida.  Musculoskeletal: He exhibits deformity. He exhibits no edema or tenderness.  There is stiffness in both lower extremities.  There is deformity in both feet especially in the right foot with inward rotation at the ankle.  He is wearing braces on both feet.  He has some pressure sores on the right toes as he is getting adjusted to the new prosthesis for that foot.  Lymphadenopathy:    He has no cervical adenopathy.  Neurological: He is alert and oriented to person, place, and time. He has normal reflexes. No cranial nerve deficit.  Has diminished sensation in both feet secondary to the spina bifida and  diabetes.  Skin: Skin is warm and dry. No rash noted.  Psychiatric: He has a normal mood and affect. His behavior is normal. Judgment and thought content normal.  Patient's mood affect and behavior are normal.  Nursing note and vitals reviewed.  BP 127/60 (BP Location: Right Arm)   Pulse 70   Temp 97.8 F (36.6 C) (Oral)   Ht 5' 8" (1.727 m)   Wt 240 lb (108.9 kg)   BMI 36.49 kg/m         Assessment & Plan:  1. Type 2 diabetes mellitus with diabetic nephropathy, without long-term current use of insulin ([place]) -The patient continues to try to do the best he can with his diabetes but unfortunately due to his immobility is unable to keep this under as good of control as possible. - BMP8+EGFR - CBC with Differential/Platelet - Bayer DCA Hb A1c Waived  2. Pure hypercholesterolemia -He has had bypass surgery in the past and continues to do all he can to keep his cholesterol under good control and keep his weight down as an activity as he has had to become because of  his immobility. - CBC with Differential/Platelet - Lipid panel  3. Vitamin D deficiency -He will continue with current treatment pending results of lab work - CBC with Differential/Platelet - VITAMIN D 25 Hydroxy (Vit-D Deficiency, Fractures)  4. Essential hypertension -The blood pressure is good today and he will continue with current treatment - BMP8+EGFR - CBC with Differential/Platelet - Hepatic function panel  5. Chronic diastolic CHF (congestive heart failure) ([place]) -He continues to follow-up periodically with a cardiologist. - CBC with Differential/Platelet  6. Spina bifida of lumbar region without hydrocephalus ([place]) -The spina bifida is continued to cause progressive weakness in the lower extremities and diminishing his ability to be mobile. - CBC with Differential/Platelet - Ambulatory referral to Physical Therapy  7. General weakness -Continue with physical therapy as much as possible - BMP8+EGFR - CBC with Differential/Platelet - Ambulatory referral to Physical Therapy  8. Gait instability -Continue with braces and get as much as assistance as possible with mobility through a motorized wheelchair and physical therapy - Ambulatory referral to Physical Therapy  9. Recent urinary tract infection -He is feeling better with this and we will check another urine to make sure everything is cleared up.  He is not having any burning now.  He continues to have incontinence because of the spina bifida - Urinalysis, Complete - Urine Culture  10. Immobility -Highly recommend a motorized wheelchair so that patient can keep his dignity and mobility and independence as much as possible due to his progressive weakness in his lower extremities from the spina bifida.  . Patient Instructions                       Medicare Annual Wellness Visit  [facility] and the medical providers at [facility] strive to bring you the best medical care.  In doing so we  not only want to address your current medical conditions and concerns but also to detect new conditions early and prevent illness, disease and health-related problems.    Medicare offers a yearly Wellness Visit which allows our clinical staff to assess your need for preventative services including immunizations, lifestyle education, counseling to decrease risk of preventable diseases and screening for fall risk and other medical concerns.    This visit is provided free of charge (no copay) for all Medicare recipients. The clinical  pharmacists at [facility] have begun to conduct these Wellness Visits which will also include a thorough review of all your medications.    As you primary medical provider recommend that you make an appointment for your Annual Wellness Visit if you have not done so already this year.  You may set up this appointment before you leave today or you may call back ([phone]) and schedule an appointment.  Please make sure when you call that you mention that you are scheduling your Annual Wellness Visit with the clinical pharmacist so that the appointment may be made for the proper length of time.     Continue current medications. Continue good therapeutic lifestyle changes which include good diet and exercise. Fall precautions discussed with patient. If an FOBT was given today- please return it to our front desk. If you are over 32 years old - you may need Prevnar 38 or the adult Pneumonia vaccine.  **Flu shots are available--- please call and schedule a FLU-CLINIC appointment**  After your visit with [place] today you will receive a survey in the mail or online from [employer] regarding your care with [place]. Please take a moment to fill this out. Your feedback is very important to [place] as you can help [place] better understand your patient needs as well as improve your experience and satisfaction. WE CARE ABOUT YOU!!!   We will complete the forms for the motorized  wheelchair The patient will get back in touch with [place] in the next 3 to 4 weeks to make sure everything has been completed and that the forms have been mailed and hopefully we can get approval for the wheelchair at that time. He should continue to follow-up with the prosthetic person to make sure that his new brace on the right foot is adjusted properly without any skin breakdown He should check his toes daily to make sure there is no sign of any infection.  Arrie Senate MD

## 2017-10-02 LAB — CBC WITH DIFFERENTIAL/PLATELET
BASOS: 0 %
Basophils Absolute: 0 10*3/uL (ref 0.0–0.2)
EOS (ABSOLUTE): 0.2 10*3/uL (ref 0.0–0.4)
EOS: 1 %
HEMATOCRIT: 36.6 % — AB (ref 37.5–51.0)
Hemoglobin: 12.2 g/dL — ABNORMAL LOW (ref 13.0–17.7)
Immature Grans (Abs): 0 10*3/uL (ref 0.0–0.1)
Immature Granulocytes: 0 %
LYMPHS ABS: 2.3 10*3/uL (ref 0.7–3.1)
Lymphs: 17 %
MCH: 29.5 pg (ref 26.6–33.0)
MCHC: 33.3 g/dL (ref 31.5–35.7)
MCV: 88 fL (ref 79–97)
MONOS ABS: 1.2 10*3/uL — AB (ref 0.1–0.9)
Monocytes: 9 %
NEUTROS ABS: 10.1 10*3/uL — AB (ref 1.4–7.0)
Neutrophils: 73 %
PLATELETS: 273 10*3/uL (ref 150–450)
RBC: 4.14 x10E6/uL (ref 4.14–5.80)
RDW: 13.5 % (ref 12.3–15.4)
WBC: 13.9 10*3/uL — ABNORMAL HIGH (ref 3.4–10.8)

## 2017-10-02 LAB — LIPID PANEL
CHOL/HDL RATIO: 2.9 ratio (ref 0.0–5.0)
Cholesterol, Total: 121 mg/dL (ref 100–199)
HDL: 42 mg/dL (ref >39)
LDL Calculated: 58 mg/dL (ref 0–99)
Triglycerides: 107 mg/dL (ref 0–149)
VLDL Cholesterol Cal: 21 mg/dL (ref 5–40)

## 2017-10-02 LAB — HEPATIC FUNCTION PANEL
ALBUMIN: 3.4 g/dL — AB (ref 3.5–4.7)
ALT: 9 IU/L (ref 0–44)
AST: 13 IU/L (ref 0–40)
Alkaline Phosphatase: 90 IU/L (ref 39–117)
Bilirubin Total: 0.3 mg/dL (ref 0.0–1.2)
Bilirubin, Direct: 0.13 mg/dL (ref 0.00–0.40)
Total Protein: 5.7 g/dL — ABNORMAL LOW (ref 6.0–8.5)

## 2017-10-02 LAB — BMP8+EGFR
BUN / CREAT RATIO: 12 (ref 10–24)
BUN: 18 mg/dL (ref 8–27)
CO2: 23 mmol/L (ref 20–29)
CREATININE: 1.49 mg/dL — AB (ref 0.76–1.27)
Calcium: 8.9 mg/dL (ref 8.6–10.2)
Chloride: 102 mmol/L (ref 96–106)
GFR, EST AFRICAN AMERICAN: 49 mL/min/{1.73_m2} — AB (ref >59)
GFR, EST NON AFRICAN AMERICAN: 42 mL/min/{1.73_m2} — AB (ref >59)
Glucose: 114 mg/dL — ABNORMAL HIGH (ref 65–99)
POTASSIUM: 5 mmol/L (ref 3.5–5.2)
Sodium: 140 mmol/L (ref 134–144)

## 2017-10-02 LAB — VITAMIN D 25 HYDROXY (VIT D DEFICIENCY, FRACTURES): VIT D 25 HYDROXY: 40.1 ng/mL (ref 30.0–100.0)

## 2017-10-05 ENCOUNTER — Other Ambulatory Visit: Payer: Self-pay | Admitting: Family Medicine

## 2017-10-05 LAB — URINALYSIS, COMPLETE
BILIRUBIN UA: NEGATIVE
GLUCOSE, UA: NEGATIVE
Ketones, UA: NEGATIVE
Nitrite, UA: POSITIVE — AB
Specific Gravity, UA: 1.02 (ref 1.005–1.030)
UUROB: 0.2 mg/dL (ref 0.2–1.0)
pH, UA: 7 (ref 5.0–7.5)

## 2017-10-05 LAB — MICROSCOPIC EXAMINATION
Epithelial Cells (non renal): NONE SEEN /hpf (ref 0–10)
RENAL EPITHEL UA: NONE SEEN /HPF
WBC, UA: >30 /hpf — AB (ref 0–5)

## 2017-10-05 LAB — BAYER DCA HB A1C WAIVED: HB A1C (BAYER DCA - WAIVED): 7 % — ABNORMAL HIGH (ref <7.0)

## 2017-10-06 ENCOUNTER — Telehealth: Payer: Self-pay | Admitting: Family Medicine

## 2017-10-06 ENCOUNTER — Other Ambulatory Visit: Payer: Self-pay | Admitting: *Deleted

## 2017-10-06 DIAGNOSIS — N390 Urinary tract infection, site not specified: Secondary | ICD-10-CM

## 2017-10-06 LAB — URINE CULTURE

## 2017-10-06 MED ORDER — CEFDINIR 300 MG PO CAPS: 300.0000 mg | ORAL_CAPSULE | Freq: Two times a day (BID) | ORAL | 0 refills | Status: DC

## 2017-10-06 NOTE — Telephone Encounter (Signed)
Waiting on this - PT eval -- pt aware 8/13-jhb

## 2017-10-06 NOTE — Addendum Note (Signed)
Addended by: Nigel Berthold C on: 10/06/2017 11:44 AM   Modules accepted: Orders

## 2017-10-07 ENCOUNTER — Telehealth: Payer: Self-pay | Admitting: Family Medicine

## 2017-10-08 NOTE — Telephone Encounter (Signed)
Pt notified to come in for repeat urinalysis Verbalizes understanding

## 2017-10-08 NOTE — Telephone Encounter (Signed)
Patient is being treated with Omnicef 300 mg bid x 10 days by Dr. Laurance Flatten since 10/03/17.  He is to come back in when completed for repeat urinalysis.  He reports the dysuria has stopped but he is still having frequent urination.  Would like to know if this is normal or if he should do something different?  Please advise.

## 2017-10-08 NOTE — Telephone Encounter (Signed)
Need to cone in for urinalysis

## 2017-10-12 ENCOUNTER — Telehealth (HOSPITAL_COMMUNITY): Payer: Self-pay | Admitting: Family Medicine

## 2017-10-12 ENCOUNTER — Telehealth: Payer: Self-pay | Admitting: Family Medicine

## 2017-10-12 NOTE — Telephone Encounter (Signed)
Apt made 8/20

## 2017-10-12 NOTE — Telephone Encounter (Signed)
10/12/17  pt said that he had a problem he had to get taken care of tomorrow and for now he will call back to reschedule

## 2017-10-12 NOTE — Telephone Encounter (Signed)
Need to se rash

## 2017-10-12 NOTE — Telephone Encounter (Signed)
Patient was put on bupropion 300mg  8/12 and states that he broke out with a rash starting yesterday . Having a red rash around his groin area and hip area. States it does not itch. Covering PCP- please advise

## 2017-10-12 NOTE — Telephone Encounter (Signed)
Patient states he is having a reaction from the last med he was put on. He does not know the name of it. He is experiencing a red rash around groin and hip area. Patient stated it started mid day Sunday and got worse over night. It does not itch and is the only symptom he has. Please advise.

## 2017-10-13 ENCOUNTER — Ambulatory Visit (HOSPITAL_COMMUNITY): Payer: Medicare Other

## 2017-10-13 ENCOUNTER — Ambulatory Visit (INDEPENDENT_AMBULATORY_CARE_PROVIDER_SITE_OTHER): Payer: Medicare Other | Admitting: Family Medicine

## 2017-10-13 ENCOUNTER — Encounter: Payer: Self-pay | Admitting: Family Medicine

## 2017-10-13 VITALS — BP 127/79 | HR 78 | Temp 97.1°F | Ht 68.0 in

## 2017-10-13 DIAGNOSIS — B372 Candidiasis of skin and nail: Secondary | ICD-10-CM

## 2017-10-13 DIAGNOSIS — I6523 Occlusion and stenosis of bilateral carotid arteries: Secondary | ICD-10-CM

## 2017-10-13 DIAGNOSIS — N3000 Acute cystitis without hematuria: Secondary | ICD-10-CM

## 2017-10-13 DIAGNOSIS — R350 Frequency of micturition: Secondary | ICD-10-CM | POA: Diagnosis not present

## 2017-10-13 LAB — URINALYSIS, COMPLETE
Bilirubin, UA: NEGATIVE
Glucose, UA: NEGATIVE
KETONES UA: NEGATIVE
Nitrite, UA: POSITIVE — AB
SPEC GRAV UA: 1.025 (ref 1.005–1.030)
Urobilinogen, Ur: 0.2 mg/dL (ref 0.2–1.0)
pH, UA: 6 (ref 5.0–7.5)

## 2017-10-13 LAB — MICROSCOPIC EXAMINATION
RBC, UA: >30 /hpf — AB (ref 0–2)
WBC, UA: >30 /hpf — AB (ref 0–5)

## 2017-10-13 MED ORDER — FLUCONAZOLE 150 MG PO TABS
150.0000 mg | ORAL_TABLET | ORAL | 0 refills | Status: DC
Start: 2017-10-13 — End: 2018-03-31

## 2017-10-13 MED ORDER — LEVOFLOXACIN 250 MG PO TABS: ORAL_TABLET | ORAL | 0 refills | Status: DC

## 2017-10-13 MED ORDER — NYSTATIN 100000 UNIT/GM EX POWD: Freq: Four times a day (QID) | CUTANEOUS | 0 refills | Status: DC

## 2017-10-13 NOTE — Progress Notes (Signed)
BP 127/79   Pulse 78   Temp (!) 97.1 F (36.2 C) (Oral)   Ht 5\' 8"  (1.727 m)   BMI 36.49 kg/m    Subjective:    Patient ID: [name]., male    DOB: 12-Dec-1930, 84 y.o.   MRN: [id number]  HPI: [name]. is a 84 y.o. male presenting on 10/13/2017 for Rash (groin area x 2 days- thinks it from the cefdinir. Pateint has stopped taking it. Patient states he is still having frequent urination and would like his urine from home checked today )   HPI Rash in groin area Patient comes in complaining of a rash in his left groin that is been there for 2 days.  He was given Ceftin ear last week for an infection he thinks it came from that.  He says is very pruritic but denies any drainage.  He denies any fevers or chills or discharge.  He denies any fevers or chills.  Patient is still having some urinary frequency because he stopped the Ceftin ear from before, will run urine culture as well.  Relevant past medical, surgical, family and social history reviewed and updated as indicated. Interim medical history since our last visit reviewed. Allergies and medications reviewed and updated.  Review of Systems  Constitutional: Negative for chills and fever.  Respiratory: Negative for shortness of breath and wheezing.   Cardiovascular: Negative for chest pain and leg swelling.  Skin: Positive for rash. Negative for color change.  All other systems reviewed and are negative.   Per HPI unless specifically indicated above   Allergies as of 10/13/2017      Reactions   Aspirin    In high doses   Sulfonamide Derivatives    REACTION: pruitis patient cant remember its been so long    Nitrofurantoin Rash      Medication List        Accurate as of 10/13/17 11:29 AM. Always use your most recent med list.          aspirin 81 MG tablet Take 81 mg by mouth daily.   buPROPion 300 MG 24 hr tablet Commonly known as:  WELLBUTRIN XL TAKE 1 TABLET DAILY   calcitRIOL 0.25 MCG  capsule Commonly known as:  ROCALTROL TAKE ONE CAPSULE BY MOUTH ONCE DAILY ON MONDAY, WEDNESDAY, AND FRIDAY   cefdinir 300 MG capsule Commonly known as:  OMNICEF Take 1 capsule (300 mg total) by mouth 2 (two) times daily. 1 po BID   Co Q-10 100 MG Caps Take 1 capsule by mouth 2 (two) times daily.   Fish Oil 1000 MG Caps Take 1 capsule by mouth 2 (two) times daily.   fluconazole 150 MG tablet Commonly known as:  DIFLUCAN Take 1 tablet (150 mg total) by mouth once a week.   furosemide 20 MG tablet Commonly known as:  LASIX TAKE 1 TABLET DAILY   glipiZIDE 10 MG 24 hr tablet Commonly known as:  GLUCOTROL XL TAKE 1 TABLET DAILY   JANUMET 50-500 MG tablet Generic drug:  sitaGLIPtin-metformin TAKE 1 TABLET DAILY WITH FOOD   levofloxacin 250 MG tablet Commonly known as:  LEVAQUIN Take 2 on first day and then 1 a day for 6 more days for a total of 7 days   metoprolol tartrate 25 MG tablet Commonly known as:  LOPRESSOR TAKE 1 TABLET TWICE A DAY   nitroGLYCERIN 0.4 MG SL tablet Commonly known as:  NITROSTAT Place 1 tablet (0.4 mg  total) under the tongue every 5 (five) minutes as needed.   NON FORMULARY Sweetish Bitters Daily as needed   NON FORMULARY cran-actin 100mg  1 tablet daily   nystatin powder Commonly known as:  MYCOSTATIN/NYSTOP Apply topically 4 (four) times daily.   polyethylene glycol powder powder Commonly known as:  GLYCOLAX/MIRALAX Take 17 g by mouth 2 (two) times daily as needed.   PRESERVISION AREDS 2 PO Take 1 capsule by mouth 2 (two) times daily.   sodium bicarbonate 650 MG tablet Take 650 mg by mouth 2 (two) times daily.   VYTORIN 10-20 MG tablet Generic drug:  ezetimibe-simvastatin TAKE 1 TABLET DAILY AS DIRECTED          Objective:    BP 127/79   Pulse 78   Temp (!) 97.1 F (36.2 C) (Oral)   Ht 5\' 8"  (1.727 m)   BMI 36.49 kg/m   Wt Readings from Last 3 Encounters:  10/01/17 240 lb (108.9 kg)  05/29/17 235 lb (106.6 kg)    05/25/17 235 lb (106.6 kg)    Physical Exam  Constitutional: He is oriented to person, place, and time. He appears well-developed and well-nourished. No distress.  Eyes: Conjunctivae are normal. No scleral icterus.  Neck: Neck supple. No thyromegaly present.  Lymphadenopathy:    He has no cervical adenopathy.  Neurological: He is alert and oriented to person, place, and time.  Skin: Skin is warm and dry. Rash (Rash in left groin with pink papules and satellite lesions, consistent with yeast dermatitis) noted. He is not diaphoretic.  Psychiatric: He has a normal mood and affect. His behavior is normal.  Nursing note and vitals reviewed.   Urinalysis: Greater than 30 WBCs, greater than 30 RBCs, 0-10 epithelial cells, many bacteria, 3+ blood, nitrite positive, 3+ leukocytes, 2+ protein    Assessment & Plan:   Problem List Items Addressed This Visit    None    Visit Diagnoses    Acute cystitis without hematuria    -  Primary   Relevant Medications   levofloxacin (LEVAQUIN) 250 MG tablet   Other Relevant Orders   Urinalysis, Complete (Completed)   Urine Culture (Completed)   Yeast dermatitis       In groin, likely from frequent urination, will give nystatin powder and Diflucan   Relevant Medications   fluconazole (DIFLUCAN) 150 MG tablet   nystatin (MYCOSTATIN/NYSTOP) powder       Follow up plan: Return if symptoms worsen or fail to improve.  Counseling provided for all of the vaccine components Orders Placed This Encounter  Procedures  . Urinalysis, Complete    Caryl Pina, MD [facility] 10/13/2017, 11:29 AM

## 2017-10-16 ENCOUNTER — Telehealth: Payer: Self-pay | Admitting: Family Medicine

## 2017-10-16 NOTE — Telephone Encounter (Signed)
FYI: pt wants dr Building control surveyor to know he is feeling better and its clearing up a lot and wants to thank him so much.

## 2017-10-16 NOTE — Telephone Encounter (Signed)
Okay sounds good. thanks ?

## 2017-10-17 LAB — URINE CULTURE

## 2017-10-19 ENCOUNTER — Ambulatory Visit: Payer: Medicare Other | Admitting: Family Medicine

## 2017-10-21 DIAGNOSIS — H353211 Exudative age-related macular degeneration, right eye, with active choroidal neovascularization: Secondary | ICD-10-CM | POA: Diagnosis not present

## 2017-10-21 DIAGNOSIS — H353221 Exudative age-related macular degeneration, left eye, with active choroidal neovascularization: Secondary | ICD-10-CM | POA: Diagnosis not present

## 2017-11-10 DIAGNOSIS — N183 Chronic kidney disease, stage 3 (moderate): Secondary | ICD-10-CM | POA: Diagnosis not present

## 2017-11-10 DIAGNOSIS — E559 Vitamin D deficiency, unspecified: Secondary | ICD-10-CM | POA: Diagnosis not present

## 2017-11-10 DIAGNOSIS — I10 Essential (primary) hypertension: Secondary | ICD-10-CM | POA: Diagnosis not present

## 2017-11-10 DIAGNOSIS — D509 Iron deficiency anemia, unspecified: Secondary | ICD-10-CM | POA: Diagnosis not present

## 2017-11-10 DIAGNOSIS — Z79899 Other long term (current) drug therapy: Secondary | ICD-10-CM | POA: Diagnosis not present

## 2017-11-10 DIAGNOSIS — R809 Proteinuria, unspecified: Secondary | ICD-10-CM | POA: Diagnosis not present

## 2017-11-11 ENCOUNTER — Telehealth (HOSPITAL_COMMUNITY): Payer: Self-pay | Admitting: Family Medicine

## 2017-11-11 ENCOUNTER — Ambulatory Visit (HOSPITAL_COMMUNITY): Payer: Medicare Other

## 2017-11-11 ENCOUNTER — Telehealth: Payer: Self-pay | Admitting: Family Medicine

## 2017-11-11 ENCOUNTER — Other Ambulatory Visit: Payer: Medicare Other

## 2017-11-11 DIAGNOSIS — R300 Dysuria: Secondary | ICD-10-CM | POA: Diagnosis not present

## 2017-11-11 LAB — URINALYSIS
Bilirubin, UA: NEGATIVE
Glucose, UA: NEGATIVE
KETONES UA: NEGATIVE
NITRITE UA: POSITIVE — AB
PH UA: 6 (ref 5.0–7.5)
Specific Gravity, UA: 1.02 (ref 1.005–1.030)
Urobilinogen, Ur: 0.2 mg/dL (ref 0.2–1.0)

## 2017-11-11 NOTE — Telephone Encounter (Signed)
Please advise if can leave urine or needs an appointment?

## 2017-11-11 NOTE — Telephone Encounter (Signed)
Pt notified of recommendation Will bring in specimen Pt sees Dr Karsten Ro Will call and schedule appt Order in Epic for UA and culture

## 2017-11-11 NOTE — Telephone Encounter (Signed)
Get urine for urinalysis and culture and sensitivity.  Please schedule this patient to see urologist that comes to [facility] as soon as possible and hopefully some of these results will be back at that time.

## 2017-11-11 NOTE — Telephone Encounter (Signed)
11/11/17  pt cx said that he had a situation and would call back when he got everything straightened out to rescehdule

## 2017-11-15 LAB — URINE CULTURE

## 2017-11-16 ENCOUNTER — Other Ambulatory Visit: Payer: Self-pay | Admitting: *Deleted

## 2017-11-16 MED ORDER — SULFAMETHOXAZOLE-TRIMETHOPRIM 800-160 MG PO TABS: 1.0000 | ORAL_TABLET | Freq: Two times a day (BID) | ORAL | 0 refills | Status: DC

## 2017-11-19 DIAGNOSIS — N3000 Acute cystitis without hematuria: Secondary | ICD-10-CM | POA: Diagnosis not present

## 2017-11-19 DIAGNOSIS — N312 Flaccid neuropathic bladder, not elsewhere classified: Secondary | ICD-10-CM | POA: Diagnosis not present

## 2017-11-23 ENCOUNTER — Telehealth: Payer: Self-pay | Admitting: Family Medicine

## 2017-11-25 DIAGNOSIS — H353221 Exudative age-related macular degeneration, left eye, with active choroidal neovascularization: Secondary | ICD-10-CM | POA: Diagnosis not present

## 2017-11-25 DIAGNOSIS — H353211 Exudative age-related macular degeneration, right eye, with active choroidal neovascularization: Secondary | ICD-10-CM | POA: Diagnosis not present

## 2017-11-26 NOTE — Telephone Encounter (Signed)
Pt aware - paper he requested (that he brought in) are back up front and ready.

## 2017-12-14 DIAGNOSIS — D509 Iron deficiency anemia, unspecified: Secondary | ICD-10-CM | POA: Diagnosis not present

## 2017-12-14 DIAGNOSIS — N183 Chronic kidney disease, stage 3 (moderate): Secondary | ICD-10-CM | POA: Diagnosis not present

## 2017-12-14 DIAGNOSIS — I10 Essential (primary) hypertension: Secondary | ICD-10-CM | POA: Diagnosis not present

## 2017-12-14 DIAGNOSIS — R809 Proteinuria, unspecified: Secondary | ICD-10-CM | POA: Diagnosis not present

## 2017-12-14 DIAGNOSIS — Z79899 Other long term (current) drug therapy: Secondary | ICD-10-CM | POA: Diagnosis not present

## 2017-12-14 DIAGNOSIS — E559 Vitamin D deficiency, unspecified: Secondary | ICD-10-CM | POA: Diagnosis not present

## 2017-12-17 ENCOUNTER — Other Ambulatory Visit: Payer: Self-pay | Admitting: Family Medicine

## 2017-12-28 ENCOUNTER — Other Ambulatory Visit: Payer: Self-pay | Admitting: Family Medicine

## 2017-12-30 DIAGNOSIS — H353221 Exudative age-related macular degeneration, left eye, with active choroidal neovascularization: Secondary | ICD-10-CM | POA: Diagnosis not present

## 2017-12-30 DIAGNOSIS — H353211 Exudative age-related macular degeneration, right eye, with active choroidal neovascularization: Secondary | ICD-10-CM | POA: Diagnosis not present

## 2018-01-03 ENCOUNTER — Other Ambulatory Visit: Payer: Self-pay | Admitting: Family Medicine

## 2018-01-06 DIAGNOSIS — N183 Chronic kidney disease, stage 3 (moderate): Secondary | ICD-10-CM | POA: Diagnosis not present

## 2018-01-06 DIAGNOSIS — E1129 Type 2 diabetes mellitus with other diabetic kidney complication: Secondary | ICD-10-CM | POA: Diagnosis not present

## 2018-01-06 DIAGNOSIS — I10 Essential (primary) hypertension: Secondary | ICD-10-CM | POA: Diagnosis not present

## 2018-01-06 DIAGNOSIS — E875 Hyperkalemia: Secondary | ICD-10-CM | POA: Diagnosis not present

## 2018-01-06 DIAGNOSIS — R809 Proteinuria, unspecified: Secondary | ICD-10-CM | POA: Diagnosis not present

## 2018-01-06 DIAGNOSIS — E872 Acidosis: Secondary | ICD-10-CM | POA: Diagnosis not present

## 2018-01-20 ENCOUNTER — Telehealth: Payer: Self-pay | Admitting: Family Medicine

## 2018-01-20 NOTE — Telephone Encounter (Signed)
LM to find out about paperwork that may need to be filled out.

## 2018-02-03 ENCOUNTER — Ambulatory Visit: Payer: Medicare Other | Admitting: Family Medicine

## 2018-03-02 ENCOUNTER — Telehealth (HOSPITAL_COMMUNITY): Payer: Self-pay | Admitting: Family Medicine

## 2018-03-02 NOTE — Telephone Encounter (Signed)
03/02/18  this was scheduled without consulting with [facility] and Merrilee Seashore called to schedule and said the chair asst could not do this day... he will call the patient back

## 2018-03-04 ENCOUNTER — Telehealth (HOSPITAL_COMMUNITY): Payer: Self-pay

## 2018-03-04 NOTE — Telephone Encounter (Signed)
[facility] called to change appt time to 1:45 WT

## 2018-03-09 ENCOUNTER — Ambulatory Visit (HOSPITAL_COMMUNITY): Payer: Medicare Other

## 2018-03-17 DIAGNOSIS — H35453 Secondary pigmentary degeneration, bilateral: Secondary | ICD-10-CM | POA: Diagnosis not present

## 2018-03-17 DIAGNOSIS — Z961 Presence of intraocular lens: Secondary | ICD-10-CM | POA: Diagnosis not present

## 2018-03-17 DIAGNOSIS — H35363 Drusen (degenerative) of macula, bilateral: Secondary | ICD-10-CM | POA: Diagnosis not present

## 2018-03-17 DIAGNOSIS — H35722 Serous detachment of retinal pigment epithelium, left eye: Secondary | ICD-10-CM | POA: Diagnosis not present

## 2018-03-17 DIAGNOSIS — H353221 Exudative age-related macular degeneration, left eye, with active choroidal neovascularization: Secondary | ICD-10-CM | POA: Diagnosis not present

## 2018-03-17 DIAGNOSIS — H353211 Exudative age-related macular degeneration, right eye, with active choroidal neovascularization: Secondary | ICD-10-CM | POA: Diagnosis not present

## 2018-03-17 DIAGNOSIS — H04123 Dry eye syndrome of bilateral lacrimal glands: Secondary | ICD-10-CM | POA: Diagnosis not present

## 2018-03-17 DIAGNOSIS — H18239 Secondary corneal edema, unspecified eye: Secondary | ICD-10-CM | POA: Diagnosis not present

## 2018-03-17 DIAGNOSIS — H02204 Unspecified lagophthalmos left upper eyelid: Secondary | ICD-10-CM | POA: Diagnosis not present

## 2018-03-25 ENCOUNTER — Other Ambulatory Visit: Payer: Self-pay | Admitting: Family Medicine

## 2018-03-28 ENCOUNTER — Other Ambulatory Visit: Payer: Self-pay | Admitting: Family Medicine

## 2018-03-29 ENCOUNTER — Ambulatory Visit (HOSPITAL_COMMUNITY): Payer: Medicare Other | Admitting: Specialist

## 2018-03-29 ENCOUNTER — Ambulatory Visit (HOSPITAL_COMMUNITY): Payer: Medicare Other | Attending: Family Medicine

## 2018-03-29 ENCOUNTER — Other Ambulatory Visit: Payer: Self-pay

## 2018-03-29 ENCOUNTER — Encounter (HOSPITAL_COMMUNITY): Payer: Self-pay

## 2018-03-29 DIAGNOSIS — R29898 Other symptoms and signs involving the musculoskeletal system: Secondary | ICD-10-CM | POA: Diagnosis not present

## 2018-03-29 NOTE — Therapy (Signed)
[place], [place], [address] Phone: [phone]   Fax:  [phone]  Occupational Therapy Wheelchair Evaluation  Patient Details  Name: [name]. MRN: [id number] Date of Birth: [birth year] Referring Provider (OT): Dr. Redge Gainer   Encounter Date: 03/29/2018  OT End of Session - 03/29/18 1429    Visit Number  1    Number of Visits  1    Authorization Type  1) medicare 2) T[employer]are    OT Start Time  1345    OT Stop Time  1445    OT Time Calculation (min)  60 min    Activity Tolerance  Patient tolerated treatment well    Behavior During Therapy  [facility] for tasks assessed/performed       Past Medical History:  Diagnosis Date  . Agent orange exposure 1966 or 1971  . Anemia   . Anxiety   . Arthritis    "hands and back" (01/20/2013)  . CAD (coronary artery disease)    native vessel  . Carotid artery disease (HCC)    nonobstructive  . Cataract   . Cecal diverticulitis 2008   drained  . Cellulitis, gluteal    bilateral for the past 6 months/notes 01/20/2013  . Chronic lower back pain   . Chronic renal insufficiency   . Depression   . Diabetes mellitus type II   . Exertional shortness of breath   . HTN (hypertension)   . Hyperlipidemia   . Hypokalemia   . Malnutrition ([place])    protein-calorie  . Myocardial infarction [facility])    "silent; before OHS" (01/20/2013)  . PTSD (post-traumatic stress disorder)   . Spina bifida ([place])   . Urinary incontinence     Past Surgical History:  Procedure Laterality Date  . CARDIAC CATHETERIZATION  1998   "couple before my OHS" (01/20/2013)  . CARPAL TUNNEL RELEASE Right 1980's  . CATARACT EXTRACTION W/ INTRAOCULAR LENS  IMPLANT, BILATERAL Bilateral ~ 2010  . CORONARY ARTERY BYPASS GRAFT  1998   "CABG X4" (01/20/2013)  . CYSTECTOMY  2000's   "cytal cyst on my intestines; probed then drained it; hospitalized for 13 days; NPO" (01/20/2013)  . EYE SURGERY    .  HEMORRHOID BANDING  ~ 10/2012  . KNEE SURGERY Left 1964   "exploratory; sewed it up w/wire" (01/20/2013)  . PROSTATE SURGERY    . SPINE SURGERY  11/24/1932   "Spina bifida surgery"  . STRABISMUS SURGERY Bilateral 03/29/2015   Procedure: REPAIR STRABISMUS BILATERAL;  Surgeon: Lamonte Sakai, MD;  Location: [facility];  Service: Ophthalmology;  Laterality: Bilateral;    There were no vitals filed for this visit.     [facility] OT Assessment - 03/29/18 1428      Assessment   Medical Diagnosis  wheelchair evaluation    Referring Provider (OT)  Dr. Redge Gainer      Precautions   Precautions  Fall                  Date: 03/29/2018 Patient Name: [name], [name].  Address: [address], [place], [place] [address] DOB: November 14, 2030      [name], [name]. was seen today in this clinic for a powered wheelchair evaluation. [name] has a past medical history that includes Spina Bifida (birth), Diabetes Mellitus II, HTN, Hyperlipidemia, Chronic decubiti ulcer, Arthritis, CHF, Bilateral carpal tunnel syndrome, Leukocytosis, Cellulitis, Right damaged rotator cuff, and Hypokalemia.  His surgical history includes: Right carpal  tunnel release and CABG.  [name] lives with his wife in a 1 level single story house. He utilizes a ramped entrance to enter and exit the home. His wife, [name] provides Moderate to Maximum assistance for all bathing and dressing tasks.  [name] completes all meal prep and housekeeping tasks. [name] reports that friends will occasionally help as well. [name] has not attempted to ambulate in the past 2-3 months due to a recent illness. He reports the medication prescribed took a lot of his strength. [name] reports his diagnosis of Spina Bifida has been present since birth although in the past year he has noticed progressive weakness which has prevented him from walking and interacting in his home and the community safely. He currently is using a used manual  wheelchair provided by a family member and relies on it for all mobility and completion of daily tasks. Due to significant BUE weakness, he is unable to self-propel a manual wheelchair and relies on his wife or others for all modes of transport.        A FULL PHYSICAL ASSESSMENT REVEALS THE FOLLOWING    Existing Equipment:   Manual wheelchair, ramped van, bilateral leg braces, 4 wheeled walker, built in shower bench, and grab bars.   Transfers:   All functional transfers are completed at Maximum assistance level. [name] uses his walker and assistance from his wife to transfer from surface to surface; such as the bed to wheelchair.    Head and Neck:    A/ROM [facility]   Trunk and Pelvis: Trunk: A/ROM is [facility] for needed daily tasks.    Hip Unable to achieve full A/ROM bilateral hip flexion. MMT: 3-/5 for right and left.   Knees:  Able to achieve functional bilateral knee extension although not full range. Right and left knee extension/flexion strength: 4-/5. Hip adductors: 4/5. Hip abductors: 3+/5.  Feet and Ankles: Bilateral ankle strength at 3-/5 for left and right.   Upper Extremities: BUE A/ROM shoulder is approximately 50-75% full range, Bilateral elbow and wrist A/ROM [facility]. Bilateral shoulder flexion, abduction, IR/er: 3-/5. Bilateral bicep extension: 3+/5. Bilateral bicep flexion: 4/5. Bilateral hands present with decreased gross grasp. Audible crepitation in all joints of BUE during strength and ROM assessment.  Weight Shifting Ability:  Weight shifting ability is limited and not adequate to what is needed to prevent skin breakdown.  Skin Integrity:  [name] has a history of skin breakdown. Reports two current decubiti ulcers on bilateral buttocks. Has a long-standing history with skin breakdown and decreased healing.       Cognition:  WFL  Activity Tolerance: [name] is wheelchair bound and non-ambulatory. He spends approximately 6 hours of the day in his wheelchair and the rest  in bed due to his skin breakdown.   GOALS/OBJECTIVE OF SEATING INTERVENTION:  Recommendation: [name] would benefit from a powered wheelchair for use in his home and in the community. [name] is limited by his decreased BUE strength and decreased grip strength and left-hand sensation due to carpal tunnel syndrome and is unable to self-propel his manual wheelchair.  A scooter would not be beneficial for Mr. Mare Ferrari as he does not have adequate space in his home for required turning radius. Mr. Huy is motivated and willing to use his power wheelchair and is physically and mentally able to operate his power wheelchair. A powered wheelchair with an elevating seat will allow Mr. Albea to safely transfer out of his present chair and increase  his ability to be more mobile during daily activities and his quality of life.  If you require any further information concerning Mr. Racca's positioning, independence or mobility needs; or any further information why a lesser device will not work, please do not hesitate to contact me at [place], [place]. [address] [place], [place] [address] [phone].   I have no relationship with either the supplier or manufacturer of the equipment recommended.   Thank you for this referral,   ____________________        Ailene Ravel OTR/L, CBIS  [phone] (main) [email or web address]                 Plan - 03/29/18 1429    OT Frequency  One time visit    Clinical Decision Making  --    Consulted and Agree with Plan of Care  Patient       Patient will benefit from skilled therapeutic intervention in order to improve the following deficits and impairments:     Visit Diagnosis: Other symptoms and signs involving the musculoskeletal system    Problem List Patient Active Problem List   Diagnosis Date Noted  . Senile purpura ([place]) 09/22/2016  . General weakness 09/22/2016  . Bilateral  carpal tunnel syndrome 09/22/2016  . Leukocytosis 07/30/2015  . Decubitus ulcer, buttock 10/31/2014  . Chronic diastolic CHF (congestive heart failure) ([place]) 09/22/2013  . Constipation 07/05/2013  . Cellulitis 01/19/2013  . Abscess or cellulitis of gluteal region 01/19/2013  . UTI (lower urinary tract infection) 01/19/2013  . Urinary incontinence 12/13/2012  . External hemorrhoids with complication 27/04/5007  . Chronic constipation 11/29/2012  . Metabolic syndrome 38/18/2993  . Hemorrhoid prolapse 11/29/2012  . Spina bifida ([place]) 11/18/2012  . CAD, NATIVE VESSEL 03/08/2009  . Type 2 diabetes mellitus with diabetic nephropathy ([place]) 10/29/2008  . MALNUTRITION, PROTEIN-CALORIE 10/29/2008  . Type 2 diabetes mellitus with stage 3 chronic kidney disease, without long-term current use of insulin ([place]) 10/29/2008  . HYPOKALEMIA 10/29/2008  . ANEMIA 10/29/2008  . PTSD 10/29/2008  . Essential hypertension 10/29/2008  . Chronic kidney disease, stage IV (severe) ([place]) 10/29/2008   Ailene Ravel, OTR/L,CBIS  [phone]  03/29/2018, 2:31 PM  [facility] [address] [place], [place], [address] Phone: [phone]   Fax:  [phone]  Name: Boden Stucky. MRN: [id number] Date of Birth: [birth year]

## 2018-03-31 ENCOUNTER — Ambulatory Visit (INDEPENDENT_AMBULATORY_CARE_PROVIDER_SITE_OTHER): Payer: Medicare Other | Admitting: Family Medicine

## 2018-03-31 ENCOUNTER — Encounter: Payer: Self-pay | Admitting: Family Medicine

## 2018-03-31 VITALS — BP 115/71 | HR 81 | Temp 97.0°F | Ht 68.0 in | Wt 238.0 lb

## 2018-03-31 DIAGNOSIS — Z87440 Personal history of urinary (tract) infections: Secondary | ICD-10-CM

## 2018-03-31 DIAGNOSIS — I10 Essential (primary) hypertension: Secondary | ICD-10-CM | POA: Diagnosis not present

## 2018-03-31 DIAGNOSIS — R29898 Other symptoms and signs involving the musculoskeletal system: Secondary | ICD-10-CM

## 2018-03-31 DIAGNOSIS — Z7409 Other reduced mobility: Secondary | ICD-10-CM

## 2018-03-31 DIAGNOSIS — E559 Vitamin D deficiency, unspecified: Secondary | ICD-10-CM | POA: Diagnosis not present

## 2018-03-31 DIAGNOSIS — E1121 Type 2 diabetes mellitus with diabetic nephropathy: Secondary | ICD-10-CM

## 2018-03-31 DIAGNOSIS — I5032 Chronic diastolic (congestive) heart failure: Secondary | ICD-10-CM | POA: Diagnosis not present

## 2018-03-31 DIAGNOSIS — Q057 Lumbar spina bifida without hydrocephalus: Secondary | ICD-10-CM

## 2018-03-31 DIAGNOSIS — R531 Weakness: Secondary | ICD-10-CM | POA: Diagnosis not present

## 2018-03-31 DIAGNOSIS — E7800 Pure hypercholesterolemia, unspecified: Secondary | ICD-10-CM | POA: Diagnosis not present

## 2018-03-31 LAB — URINALYSIS, COMPLETE
Bilirubin, UA: NEGATIVE
Glucose, UA: NEGATIVE
Ketones, UA: NEGATIVE
Nitrite, UA: NEGATIVE
PH UA: 6 (ref 5.0–7.5)
Specific Gravity, UA: 1.015 (ref 1.005–1.030)
Urobilinogen, Ur: 0.2 mg/dL (ref 0.2–1.0)

## 2018-03-31 LAB — BAYER DCA HB A1C WAIVED: HB A1C (BAYER DCA - WAIVED): 6.4 % (ref <7.0)

## 2018-03-31 LAB — MICROSCOPIC EXAMINATION
Bacteria, UA: NONE SEEN
Epithelial Cells (non renal): NONE SEEN /hpf (ref 0–10)
Renal Epithel, UA: NONE SEEN /hpf
WBC, UA: >30 /hpf — AB (ref 0–5)

## 2018-03-31 NOTE — Addendum Note (Signed)
Addended by: Zannie Cove on: 03/31/2018 04:37 PM   Modules accepted: Orders

## 2018-03-31 NOTE — Progress Notes (Addendum)
Subjective:    Patient ID: [name]., male    DOB: 02-24-1931, 85 y.o.   MRN: [id number]  HPI Pt here for follow up and management of chronic medical problems which includes diabetes and hypertension. He is taking medication regularly.  The patient is doing well overall with no specific complaints.  He will be given an FOBT to return and will get lab work today.  He is also due to get a urine specimen.  He does not have any refills.  The patient is having issues with his band and needs to have it replaced and needs a prescription for this.  His weight is down 3 pounds since the last visit his body mass index is in the morbid obese category because of comorbid health condition.  Patient has spina bifida.  He has an ongoing chronic anemia diabetes mellitus hypertension hyperlipidemia and has had a myocardial infarction in the past.  Patient today does indicate he has had some tingling pin-like sensations in his left chest but nothing substernal.  What ever this was has gotten better.  He denies any shortness of breath anymore than usual.  He denies any trouble with nausea vomiting diarrhea blood in the stool or black tarry bowel movements but does have ongoing constipation as usual.  He continues to have problems with control because of his spina bifida and is seeing the urologist, Dr. Karsten Ro regularly and looking to go with using a catheter.  He also is needing to get a new handicap Lucianne Lei because the old one that he has been using that he purchased himself has had more issues with him getting in and out because of his worsening weakness in his lower extremities secondary to the spina bifida.  It seems to not be working as well and he needs something to be able to access the [place] so he cannot get out.  He does see neurosurgery periodically and has a tethered cord corresponding to his history of spina bifida.  His wife has been very helpful to him.  He needs as much help as he can get and especially with  having a Lucianne Lei that he can access easily since he has so much weakness in his lower extremities.    Patient Active Problem List   Diagnosis Date Noted  . Senile purpura ([place]) 09/22/2016  . General weakness 09/22/2016  . Bilateral carpal tunnel syndrome 09/22/2016  . Leukocytosis 07/30/2015  . Decubitus ulcer, buttock 10/31/2014  . Chronic diastolic CHF (congestive heart failure) ([place]) 09/22/2013  . Constipation 07/05/2013  . Cellulitis 01/19/2013  . Abscess or cellulitis of gluteal region 01/19/2013  . UTI (lower urinary tract infection) 01/19/2013  . Urinary incontinence 12/13/2012  . External hemorrhoids with complication 40/98/1191  . Chronic constipation 11/29/2012  . Metabolic syndrome 47/82/9562  . Hemorrhoid prolapse 11/29/2012  . Spina bifida ([place]) 11/18/2012  . CAD, NATIVE VESSEL 03/08/2009  . Type 2 diabetes mellitus with diabetic nephropathy ([place]) 10/29/2008  . MALNUTRITION, PROTEIN-CALORIE 10/29/2008  . Type 2 diabetes mellitus with stage 3 chronic kidney disease, without long-term current use of insulin ([place]) 10/29/2008  . HYPOKALEMIA 10/29/2008  . ANEMIA 10/29/2008  . PTSD 10/29/2008  . Essential hypertension 10/29/2008  . Chronic kidney disease, stage IV (severe) ([place]) 10/29/2008   Outpatient Encounter Medications as of 03/31/2018  Medication Sig  . aspirin 81 MG tablet Take 81 mg by mouth daily.    Marland Kitchen buPROPion (WELLBUTRIN XL) 300 MG 24 hr tablet TAKE 1 TABLET DAILY  .  Coenzyme Q10 (CO Q-10) 100 MG CAPS Take 1 capsule by mouth 2 (two) times daily.   Marland Kitchen glipiZIDE (GLUCOTROL XL) 10 MG 24 hr tablet TAKE 1 TABLET DAILY  . JANUMET 50-500 MG tablet TAKE 1 TABLET DAILY WITH FOOD  . metoprolol tartrate (LOPRESSOR) 25 MG tablet TAKE 1 TABLET TWICE A DAY  . Multiple Vitamins-Minerals (PRESERVISION AREDS 2 PO) Take 1 capsule by mouth 2 (two) times daily.  . nitroGLYCERIN (NITROSTAT) 0.4 MG SL tablet Place 1 tablet (0.4 mg total) under the tongue every 5 (five) minutes as  needed.  . NON FORMULARY cran-actin 192m 1 tablet daily  . NON FORMULARY Sweetish Bitters Daily as needed  . Omega-3 Fatty Acids (FISH OIL) 1000 MG CAPS Take 1 capsule by mouth 2 (two) times daily.    . polyethylene glycol powder (GLYCOLAX/MIRALAX) powder Take 17 g by mouth 2 (two) times daily as needed.  . sodium bicarbonate 650 MG tablet Take 650 mg by mouth 2 (two) times daily.   .Marland KitchenVYTORIN 10-20 MG tablet TAKE 1 TABLET DAILY AS DIRECTED  . [DISCONTINUED] calcitRIOL (ROCALTROL) 0.25 MCG capsule TAKE ONE CAPSULE BY MOUTH ONCE DAILY ON MONDAY, WEDNESDAY, AND FRIDAY  . [DISCONTINUED] cefdinir (OMNICEF) 300 MG capsule Take 1 capsule (300 mg total) by mouth 2 (two) times daily. 1 po BID (Patient not taking: Reported on 10/13/2017)  . [DISCONTINUED] fluconazole (DIFLUCAN) 150 MG tablet Take 1 tablet (150 mg total) by mouth once a week.  . [DISCONTINUED] furosemide (LASIX) 20 MG tablet TAKE 1 TABLET DAILY  . [DISCONTINUED] levofloxacin (LEVAQUIN) 250 MG tablet Take 2 on first day and then 1 a day for 6 more days for a total of 7 days  . [DISCONTINUED] nystatin (MYCOSTATIN/NYSTOP) powder Apply topically 4 (four) times daily.  . [DISCONTINUED] sulfamethoxazole-trimethoprim (BACTRIM DS) 800-160 MG tablet Take 1 tablet by mouth 2 (two) times daily.   No facility-administered encounter medications on file as of 03/31/2018.      Review of Systems  Constitutional: Negative.   HENT: Negative.   Eyes: Negative.   Respiratory: Negative.   Cardiovascular: Negative.   Gastrointestinal: Negative.   Endocrine: Negative.   Genitourinary: Negative.   Musculoskeletal: Negative.   Skin: Negative.   Allergic/Immunologic: Negative.   Neurological: Negative.   Hematological: Negative.   Psychiatric/Behavioral: Negative.        Objective:   Physical Exam Vitals signs and nursing note reviewed.  Constitutional:      General: He is not in acute distress.    Appearance: Normal appearance. He is  well-developed. He is obese. He is not ill-appearing.     Comments: Patient is pleasant and doing well and looks much younger in age than 85years of age.  HENT:     Head: Normocephalic and atraumatic.     Right Ear: Tympanic membrane, ear canal and external ear normal. There is no impacted cerumen.     Left Ear: Tympanic membrane, ear canal and external ear normal. There is no impacted cerumen.     Nose: Nose normal.     Mouth/Throat:     Mouth: Mucous membranes are moist.     Pharynx: Oropharynx is clear. No oropharyngeal exudate or posterior oropharyngeal erythema.  Eyes:     General: No scleral icterus.       Right eye: No discharge.        Left eye: No discharge.     Extraocular Movements: Extraocular movements intact.     Conjunctiva/sclera: Conjunctivae normal.  Pupils: Pupils are equal, round, and reactive to light.  Neck:     Musculoskeletal: Normal range of motion and neck supple.     Thyroid: No thyromegaly.     Vascular: No carotid bruit.     Trachea: No tracheal deviation.  Cardiovascular:     Rate and Rhythm: Normal rate and regular rhythm.     Pulses: Normal pulses.     Heart sounds: Normal heart sounds. No murmur.  Pulmonary:     Effort: Pulmonary effort is normal. No respiratory distress.     Breath sounds: Normal breath sounds. No wheezing or rales.     Comments: Clear anteriorly and posteriorly no axillary adenopathy Abdominal:     General: Abdomen is flat. Bowel sounds are normal.     Palpations: Abdomen is soft. There is no mass.     Tenderness: There is no abdominal tenderness. There is no guarding.  Genitourinary:    Comments: Deferred Musculoskeletal: Normal range of motion.        General: No tenderness.     Right lower leg: No edema.  Lymphadenopathy:     Cervical: No cervical adenopathy.  Skin:    General: Skin is warm and dry.     Coloration: Skin is not pale.     Findings: Lesion present. No erythema or rash.     Comments: Small skin lesion  on the dorsal aspect of the second toe of the right foot.  Patient has diminished sensation on the bottom of this foot.  Neurological:     Mental Status: He is alert and oriented to person, place, and time. Mental status is at baseline.     Cranial Nerves: No cranial nerve deficit.     Sensory: Sensory deficit present.     Gait: Gait normal.     Deep Tendon Reflexes: Reflexes are normal and symmetric. Reflexes normal.  Psychiatric:        Mood and Affect: Mood normal.        Behavior: Behavior normal.        Thought Content: Thought content normal.        Judgment: Judgment normal.    BP 115/71 (BP Location: Left Arm)   Pulse 81   Temp (!) 97 F (36.1 C) (Oral)   Ht '5\' 8"'  (1.727 m)   Wt 238 lb (108 kg)   BMI 36.19 kg/m         Assessment & Plan:  1. Type 2 diabetes mellitus with diabetic nephropathy, without long-term current use of insulin (HCC) -Continue to monitor blood sugars at home - BMP8+EGFR - CBC with Differential/Platelet - Bayer DCA Hb A1c Waived  2. Pure hypercholesterolemia -Continue as aggressive therapeutic lifestyle changes as possible and current therapy pending results of lab work - CBC with Differential/Platelet - Lipid panel  3. Essential hypertension -The blood pressure was good today and he will continue with current treatment - BMP8+EGFR - CBC with Differential/Platelet - Hepatic function panel  4. Vitamin D deficiency -Continue vitamin D replacement pending results of lab work - CBC with Differential/Platelet - VITAMIN D 25 Hydroxy (Vit-D Deficiency, Fractures)  5. Chronic diastolic CHF (congestive heart failure) ([place]) -Follow-up with cardiology as planned - CBC with Differential/Platelet - DME Hospital bed  6. Recent urinary tract infection -Follow-up with urology as planned - Urinalysis, Complete  7. Spina bifida of lumbar region without hydrocephalus ([place]) -Note from neurosurgery reviewed -The patient continues to have ongoing  weakness in his lower extremities. -He has an  old handicap Lucianne Lei but it is not functioning the need that he has for his ongoing weakness in the lower extremities.  It is difficult to use and the equipment is not as up-to-date as he needs in order to get to the doctor and get out of the house. - DME Hospital bed  8. General weakness -This is worsening especially in the lower extremities. - DME Hospital bed  9. Immobility -Patient was examined in wheelchair today and needs assistance around the home and with getting out as much assistance as he can get from outside help. - DME Hospital bed -This patient needs a power wheelchair.,  I have reviewed and agree with physical therapy evaluation for the need of a power wheelchair because of his immobility. -Because of his activities of daily living are so difficult with his immobility this would be tremendously beneficial to help him even in the home.  10. Bilateral leg weakness -Is been ongoing and worsening problem from his spina bifida. -Need for newer handicap [facility] bed -Also, as mentioned in #9 above I agree with physical therapy's recommendation for need of a power wheelchair for his activities of daily living even in the home.  Patient Instructions                       Medicare Annual Wellness Visit  [facility] and the medical providers at [facility] strive to bring you the best medical care.  In doing so we not only want to address your current medical conditions and concerns but also to detect new conditions early and prevent illness, disease and health-related problems.    Medicare offers a yearly Wellness Visit which allows our clinical staff to assess your need for preventative services including immunizations, lifestyle education, counseling to decrease risk of preventable diseases and screening for fall risk and other medical concerns.    This visit is provided free of charge (no copay) for all  Medicare recipients. The clinical pharmacists at [facility] have begun to conduct these Wellness Visits which will also include a thorough review of all your medications.    As you primary medical provider recommend that you make an appointment for your Annual Wellness Visit if you have not done so already this year.  You may set up this appointment before you leave today or you may call back ([phone]) and schedule an appointment.  Please make sure when you call that you mention that you are scheduling your Annual Wellness Visit with the clinical pharmacist so that the appointment may be made for the proper length of time.     Continue current medications. Continue good therapeutic lifestyle changes which include good diet and exercise. Fall precautions discussed with patient. If an FOBT was given today- please return it to our front desk. If you are over 20 years old - you may need Prevnar 25 or the adult Pneumonia vaccine.  **Flu shots are available--- please call and schedule a FLU-CLINIC appointment**  After your visit with [place] today you will receive a survey in the mail or online from [employer] regarding your care with [place]. Please take a moment to fill this out. Your feedback is very important to [place] as you can help [place] better understand your patient needs as well as improve your experience and satisfaction. WE CARE ABOUT YOU!!!   The patient must continue to be careful as far as getting in and out of the  current band that he has because it is older and more difficult to access. He should continue to follow-up with a urologist He should continue to follow-up with the cardiologist.  He should continue to try to drink plenty of fluids and stay well-hydrated He should remember that his next eye exam is due in August and continue to get this on a yearly basis.   Arrie Senate MD

## 2018-03-31 NOTE — Patient Instructions (Addendum)
Medicare Annual Wellness Visit  [facility] and the medical providers at [facility] strive to bring you the best medical care.  In doing so we not only want to address your current medical conditions and concerns but also to detect new conditions early and prevent illness, disease and health-related problems.    Medicare offers a yearly Wellness Visit which allows our clinical staff to assess your need for preventative services including immunizations, lifestyle education, counseling to decrease risk of preventable diseases and screening for fall risk and other medical concerns.    This visit is provided free of charge (no copay) for all Medicare recipients. The clinical pharmacists at [facility] have begun to conduct these Wellness Visits which will also include a thorough review of all your medications.    As you primary medical provider recommend that you make an appointment for your Annual Wellness Visit if you have not done so already this year.  You may set up this appointment before you leave today or you may call back ([phone]) and schedule an appointment.  Please make sure when you call that you mention that you are scheduling your Annual Wellness Visit with the clinical pharmacist so that the appointment may be made for the proper length of time.     Continue current medications. Continue good therapeutic lifestyle changes which include good diet and exercise. Fall precautions discussed with patient. If an FOBT was given today- please return it to our front desk. If you are over 56 years old - you may need Prevnar 38 or the adult Pneumonia vaccine.  **Flu shots are available--- please call and schedule a FLU-CLINIC appointment**  After your visit with [place] today you will receive a survey in the mail or online from [employer] regarding your care with [place]. Please take a moment to fill this out. Your feedback is very  important to [place] as you can help [place] better understand your patient needs as well as improve your experience and satisfaction. WE CARE ABOUT YOU!!!   The patient must continue to be careful as far as getting in and out of the current band that he has because it is older and more difficult to access. He should continue to follow-up with a urologist He should continue to follow-up with the cardiologist.  He should continue to try to drink plenty of fluids and stay well-hydrated He should remember that his next eye exam is due in August and continue to get this on a yearly basis.

## 2018-04-01 LAB — LIPID PANEL
CHOLESTEROL TOTAL: 129 mg/dL (ref 100–199)
Chol/HDL Ratio: 3.7 ratio (ref 0.0–5.0)
HDL: 35 mg/dL — ABNORMAL LOW (ref >39)
LDL Calculated: 64 mg/dL (ref 0–99)
Triglycerides: 149 mg/dL (ref 0–149)
VLDL Cholesterol Cal: 30 mg/dL (ref 5–40)

## 2018-04-01 LAB — HEPATIC FUNCTION PANEL
ALT: 11 IU/L (ref 0–44)
AST: 14 IU/L (ref 0–40)
Albumin: 3.8 g/dL (ref 3.6–4.6)
Alkaline Phosphatase: 100 IU/L (ref 39–117)
Bilirubin Total: 0.3 mg/dL (ref 0.0–1.2)
Bilirubin, Direct: 0.11 mg/dL (ref 0.00–0.40)
Total Protein: 6.3 g/dL (ref 6.0–8.5)

## 2018-04-01 LAB — CBC WITH DIFFERENTIAL/PLATELET
Basophils Absolute: 0.1 10*3/uL (ref 0.0–0.2)
Basos: 0 %
EOS (ABSOLUTE): 0.2 10*3/uL (ref 0.0–0.4)
Eos: 2 %
Hematocrit: 37.6 % (ref 37.5–51.0)
Hemoglobin: 12.6 g/dL — ABNORMAL LOW (ref 13.0–17.7)
Immature Grans (Abs): 0 10*3/uL (ref 0.0–0.1)
Immature Granulocytes: 0 %
Lymphocytes Absolute: 3.7 10*3/uL — ABNORMAL HIGH (ref 0.7–3.1)
Lymphs: 28 %
MCH: 29.4 pg (ref 26.6–33.0)
MCHC: 33.5 g/dL (ref 31.5–35.7)
MCV: 88 fL (ref 79–97)
Monocytes Absolute: 1.3 10*3/uL — ABNORMAL HIGH (ref 0.1–0.9)
Monocytes: 10 %
Neutrophils Absolute: 8.1 10*3/uL — ABNORMAL HIGH (ref 1.4–7.0)
Neutrophils: 60 %
PLATELETS: 271 10*3/uL (ref 150–450)
RBC: 4.28 x10E6/uL (ref 4.14–5.80)
RDW: 13.3 % (ref 11.6–15.4)
WBC: 13.3 10*3/uL — ABNORMAL HIGH (ref 3.4–10.8)

## 2018-04-01 LAB — BMP8+EGFR
BUN/Creatinine Ratio: 13 (ref 10–24)
BUN: 20 mg/dL (ref 8–27)
CHLORIDE: 103 mmol/L (ref 96–106)
CO2: 19 mmol/L — ABNORMAL LOW (ref 20–29)
Calcium: 9.2 mg/dL (ref 8.6–10.2)
Creatinine, Ser: 1.52 mg/dL — ABNORMAL HIGH (ref 0.76–1.27)
GFR calc Af Amer: 48 mL/min/{1.73_m2} — ABNORMAL LOW (ref >59)
GFR calc non Af Amer: 41 mL/min/{1.73_m2} — ABNORMAL LOW (ref >59)
Glucose: 67 mg/dL (ref 65–99)
Potassium: 4.8 mmol/L (ref 3.5–5.2)
Sodium: 141 mmol/L (ref 134–144)

## 2018-04-01 LAB — VITAMIN D 25 HYDROXY (VIT D DEFICIENCY, FRACTURES): Vit D, 25-Hydroxy: 39.3 ng/mL (ref 30.0–100.0)

## 2018-04-02 ENCOUNTER — Telehealth: Payer: Self-pay | Admitting: *Deleted

## 2018-04-02 LAB — URINE CULTURE

## 2018-04-02 NOTE — Progress Notes (Signed)
Copy of urine culture routed to Dr Karsten Ro per Dr Tawanna Sat request

## 2018-04-02 NOTE — Telephone Encounter (Signed)
-----   Message from Chipper Herb, MD sent at 04/02/2018  9:58 AM EST ----- The urine culture report is growing out greater than 100,000 colony-forming urine units of mixed urogenital flora with no specific agent to treat. Please call patient with this result and send a hard copy of this report along with a urinalysis to his urologist.--- Dr. Kathie Rhodes

## 2018-04-07 DIAGNOSIS — H353221 Exudative age-related macular degeneration, left eye, with active choroidal neovascularization: Secondary | ICD-10-CM | POA: Diagnosis not present

## 2018-04-21 DIAGNOSIS — H353211 Exudative age-related macular degeneration, right eye, with active choroidal neovascularization: Secondary | ICD-10-CM | POA: Diagnosis not present

## 2018-04-26 ENCOUNTER — Encounter: Payer: Medicare Other | Admitting: *Deleted

## 2018-06-03 ENCOUNTER — Telehealth: Payer: Self-pay

## 2018-06-03 NOTE — Telephone Encounter (Signed)
Virtual Visit Pre-Appointment Phone Call  Steps For Call:  1. Confirm consent - "In the setting of the current Covid19 crisis, you are scheduled for a (phone or video) visit with your provider on (date) at (time).  Just as we do with many in-office visits, in order for you to participate in this visit, we must obtain consent.  If you'd like, I can send this to your mychart (if signed up) or email for you to review.  Otherwise, I can obtain your verbal consent now.  All virtual visits are billed to your insurance company just like a normal visit would be.  By agreeing to a virtual visit, we'd like you to understand that the technology does not allow for your provider to perform an examination, and thus may limit your provider's ability to fully assess your condition.  Finally, though the technology is pretty good, we cannot assure that it will always work on either your or our end, and in the setting of a video visit, we may have to convert it to a phone-only visit.  In either situation, we cannot ensure that we have a secure connection.  Are you willing to proceed?"  2. Give patient instructions for WebEx download to smartphone as below if video visit  3. Advise patient to be prepared with any vital sign or heart rhythm information, their current medicines, and a piece of paper and pen handy for any instructions they may receive the day of their visit  4. Inform patient they will receive a phone call 15 minutes prior to their appointment time (may be from unknown caller ID) so they should be prepared to answer  5. Confirm that appointment type is correct in Epic appointment notes (video vs telephone)    TELEPHONE CALL NOTE  [name]. has been deemed a candidate for a follow-up tele-health visit to limit community exposure during the Covid-19 pandemic. I spoke with the patient via phone to ensure availability of phone/video source, confirm preferred email & phone number, and discuss  instructions and expectations.  I reminded [name]. to be prepared with any vital sign and/or heart rhythm information that could potentially be obtained via home monitoring, at the time of his visit. I reminded [name]. to expect a phone call at the time of his visit if his visit.  Did the patient verbally acknowledge consent to treatment? [place], [place] 06/03/2018 4:48 PM   DOWNLOADING THE [place], go to [employer] and type in WebEx in the search bar. [place] [employer], the blue/green circle. The app is free but as with any other app downloads, their phone may require them to verify saved payment information or Apple password. The patient does NOT have to create an account.  - If Android, ask patient to go to [employer] and type in WebEx in the search bar. [place] [employer], the blue/green circle. The app is free but as with any other app downloads, their phone may require them to verify saved payment information or Android password. The patient does NOT have to create an account.   CONSENT FOR TELE-HEALTH VISIT - PLEASE REVIEW  I hereby voluntarily request, consent and authorize [facility] and its employed or contracted physicians, physician assistants, nurse practitioners or other licensed health care professionals (the Practitioner), to provide me with telemedicine health care services (the "Services") as deemed necessary  by the treating Practitioner. I acknowledge and consent to receive the Services by the Practitioner via telemedicine. I understand that the telemedicine visit will involve communicating with the Practitioner through live audiovisual communication technology and the disclosure of certain medical information by electronic transmission. I acknowledge that I have been given the opportunity to request an in-person assessment or other available alternative prior to the  telemedicine visit and am voluntarily participating in the telemedicine visit.  I understand that I have the right to withhold or withdraw my consent to the use of telemedicine in the course of my care at any time, without affecting my right to future care or treatment, and that the Practitioner or I may terminate the telemedicine visit at any time. I understand that I have the right to inspect all information obtained and/or recorded in the course of the telemedicine visit and may receive copies of available information for a reasonable fee.  I understand that some of the potential risks of receiving the Services via telemedicine include:  Marland Kitchen Delay or interruption in medical evaluation due to technological equipment failure or disruption; . Information transmitted may not be sufficient (e.g. poor resolution of images) to allow for appropriate medical decision making by the Practitioner; and/or  . In rare instances, security protocols could fail, causing a breach of personal health information.  Furthermore, I acknowledge that it is my responsibility to provide information about my medical history, conditions and care that is complete and accurate to the best of my ability. I acknowledge that Practitioner's advice, recommendations, and/or decision may be based on factors not within their control, such as incomplete or inaccurate data provided by me or distortions of diagnostic images or specimens that may result from electronic transmissions. I understand that the practice of medicine is not an exact science and that Practitioner makes no warranties or guarantees regarding treatment outcomes. I acknowledge that I will receive a copy of this consent concurrently upon execution via email to the email address I last provided but may also request a printed copy by calling the office of [facility].    I understand that my insurance will be billed for this visit.   I have read or had this consent read to me.  . I understand the contents of this consent, which adequately explains the benefits and risks of the Services being provided via telemedicine.  . I have been provided ample opportunity to ask questions regarding this consent and the Services and have had my questions answered to my satisfaction. . I give my informed consent for the services to be provided through the use of telemedicine in my medical care  By participating in this telemedicine visit I agree to the above.

## 2018-06-08 NOTE — Telephone Encounter (Signed)
Webex visit for 4/15

## 2018-06-09 ENCOUNTER — Other Ambulatory Visit: Payer: Self-pay

## 2018-06-09 ENCOUNTER — Telehealth (INDEPENDENT_AMBULATORY_CARE_PROVIDER_SITE_OTHER): Payer: Medicare Other | Admitting: Cardiology

## 2018-06-09 ENCOUNTER — Encounter: Payer: Self-pay | Admitting: Cardiology

## 2018-06-09 ENCOUNTER — Telehealth: Payer: Self-pay | Admitting: Cardiology

## 2018-06-09 VITALS — Ht 68.0 in

## 2018-06-09 DIAGNOSIS — I2510 Atherosclerotic heart disease of native coronary artery without angina pectoris: Secondary | ICD-10-CM | POA: Diagnosis not present

## 2018-06-09 DIAGNOSIS — N183 Chronic kidney disease, stage 3 unspecified: Secondary | ICD-10-CM

## 2018-06-09 DIAGNOSIS — E118 Type 2 diabetes mellitus with unspecified complications: Secondary | ICD-10-CM

## 2018-06-09 DIAGNOSIS — I6523 Occlusion and stenosis of bilateral carotid arteries: Secondary | ICD-10-CM

## 2018-06-09 NOTE — Telephone Encounter (Signed)
Spoke with [name] he  says that his cell phone has crashed and it may be at least 2 weeks before he gets it fixed. I will try and reach him again to see if he is able to do a phone visit.

## 2018-06-09 NOTE — Progress Notes (Signed)
Virtual Visit via Telephone Note   This visit type was conducted due to national recommendations for restrictions regarding the COVID-19 Pandemic (e.g. social distancing) in an effort to limit this patient's exposure and mitigate transmission in our community.  Due to his co-morbid illnesses, this patient is at least at moderate risk for complications without adequate follow up.  This format is felt to be most appropriate for this patient at this time.  The patient did not have access to video technology/had technical difficulties with video requiring transitioning to audio format only (telephone).  All issues noted in this document were discussed and addressed.  No physical exam could be performed with this format.  Please refer to the patient's chart for his  consent to telehealth for [facility].   Evaluation Performed:  Follow-up visit  Date:  06/09/2018   ID:  [name]., DOB Jun 28, 1930, MRN [id number]  Patient Location: Home Provider Location: Home  PCP:  Chipper Herb, MD  Cardiologist:  Candee Furbish, MD  Electrophysiologist:  None   Chief Complaint: CAD follow-up  History of Present Illness:    [name]. is a 85 y.o. male with history of coronary artery disease status post CABG with chronic diastolic heart failure here for follow-up. Prior patient of Dr. Claris Gladden. Echocardiogram in January 2015 showed EF of 50-55% with mildly decreased RV systolic function.  He has had prior gluteal pressure ulcers. Wound care.  He also has chronic kidney disease stage 3stage IV with creatinines ranging from 1.4-1.8.  He was praising his doctors that of taking care of him over the years. He has done quite well. He was in the [employer]. As a child he had mild spinal bifida.  He still battles with gluteal wound infections.  PMH: 1. Mild case of spina bifida - [facility] research - [facility] [facility] operated on him.  2. Type II diabetes 3. Hyperlipidemia 4. HTN 5. CKD 6.  Anemia 7. PTSD 8. CAD: CABG 7/98 (Dr. Redmond Pulling). Last Myoview in 2/07 showed EF 63%, no ischemia.  9. Gluteal decubitus ulcers 10. Obesity 11. Carotid stenosis: Carotid dopplers (5/13) with 40-59% bilateral ICA stenosis. Carotid dopplers (1/15) with 40-59% bilateral ICA stenosis. Carotid dopplers (1/17) with mild stenosis only.  12. Diastolic CHF: Echo (0/24) with EF 50-55%, normal RV size with mildly decreased systolic function.   SH: Married, lives in [place], retired from [employer], prior smoker.   06/09/2018- overall has been doing quite well, stable with regards to his coronary artery disease post CABG in 1998 with mild diastolic heart failure EF 55%.  Denies any fevers chills nausea vomiting syncope bleeding.  He feels very blessed to be alive.  Been taking his medications.  No side effects.   The patient does not have symptoms concerning for COVID-19 infection (fever, chills, cough, or new shortness of breath).    Past Medical History:  Diagnosis Date  . Agent orange exposure 1966 or 1971  . Anemia   . Anxiety   . Arthritis    "hands and back" (01/20/2013)  . CAD (coronary artery disease)    native vessel  . Carotid artery disease (HCC)    nonobstructive  . Cataract   . Cecal diverticulitis 2008   drained  . Cellulitis, gluteal    bilateral for the past 6 months/notes 01/20/2013  . Chronic lower back pain   . Chronic renal insufficiency   . Depression   . Diabetes mellitus type II   .  Exertional shortness of breath   . HTN (hypertension)   . Hyperlipidemia   . Hypokalemia   . Malnutrition ([place])    protein-calorie  . Myocardial infarction [facility])    "silent; before OHS" (01/20/2013)  . PTSD (post-traumatic stress disorder)   . Spina bifida ([place])   . Urinary incontinence    Past Surgical History:  Procedure Laterality Date  . CARDIAC CATHETERIZATION  1998   "couple before my OHS" (01/20/2013)  . CARPAL TUNNEL RELEASE Right 1980's  . CATARACT  EXTRACTION W/ INTRAOCULAR LENS  IMPLANT, BILATERAL Bilateral ~ 2010  . CORONARY ARTERY BYPASS GRAFT  1998   "CABG X4" (01/20/2013)  . CYSTECTOMY  2000's   "cytal cyst on my intestines; probed then drained it; hospitalized for 13 days; NPO" (01/20/2013)  . EYE SURGERY    . HEMORRHOID BANDING  ~ 10/2012  . KNEE SURGERY Left 1964   "exploratory; sewed it up w/wire" (01/20/2013)  . PROSTATE SURGERY    . SPINE SURGERY  11/24/1932   "Spina bifida surgery"  . STRABISMUS SURGERY Bilateral 03/29/2015   Procedure: REPAIR STRABISMUS BILATERAL;  Surgeon: Lamonte Sakai, MD;  Location: [facility];  Service: Ophthalmology;  Laterality: Bilateral;     Current Meds  Medication Sig  . aspirin 81 MG tablet Take 81 mg by mouth daily.    Marland Kitchen buPROPion (WELLBUTRIN XL) 300 MG 24 hr tablet TAKE 1 TABLET DAILY  . Coenzyme Q10 (CO Q-10) 100 MG CAPS Take 1 capsule by mouth 2 (two) times daily.   Marland Kitchen glipiZIDE (GLUCOTROL XL) 10 MG 24 hr tablet TAKE 1 TABLET DAILY  . JANUMET 50-500 MG tablet TAKE 1 TABLET DAILY WITH FOOD  . metoprolol tartrate (LOPRESSOR) 25 MG tablet TAKE 1 TABLET TWICE A DAY  . Multiple Vitamins-Minerals (PRESERVISION AREDS 2 PO) Take 1 capsule by mouth 2 (two) times daily.  . nitroGLYCERIN (NITROSTAT) 0.4 MG SL tablet Place 1 tablet (0.4 mg total) under the tongue every 5 (five) minutes as needed.  . NON FORMULARY cran-actin 100mg  1 tablet daily  . NON FORMULARY Sweetish Bitters Daily as needed  . Omega-3 Fatty Acids (FISH OIL) 1000 MG CAPS Take 1 capsule by mouth 2 (two) times daily.    . polyethylene glycol powder (GLYCOLAX/MIRALAX) powder Take 17 g by mouth 2 (two) times daily as needed.  . sodium bicarbonate 650 MG tablet Take 650 mg by mouth 2 (two) times daily.   Marland Kitchen VYTORIN 10-20 MG tablet TAKE 1 TABLET DAILY AS DIRECTED     Allergies:   Aspirin; Sulfonamide derivatives; and Nitrofurantoin   Social History   Tobacco Use  . Smoking status: Former Smoker    Packs/day: 2.00     Years: 20.00    Pack years: 40.00    Types: Cigarettes    Start date: 11/20/1948    Last attempt to quit: 08/19/1980    Years since quitting: 37.8  . Smokeless tobacco: Former Systems developer    Types: Chew  . Tobacco comment: 01/20/2013 "quit chewing 20 yr ago"  Substance Use Topics  . Alcohol use: No    Comment: 01/20/2013 "quit drinking 08/19/1980"  . Drug use: No     Family Hx: The patient's family history includes Cancer in his sister and sister; Heart attack in his father and mother; Heart disease in his father and mother.  ROS:   Please see the history of present illness.     All other systems reviewed and are negative.   Prior CV studies:  The following studies were reviewed today:  Echo-EF 50 to 55%  Labs/Other Tests and Data Reviewed:    EKG:  An ECG dated 05/25/2017 was personally reviewed today and demonstrated:  Sinus rhythm, mild sinus arrhythmia, nonspecific ST-T wave changes  Recent Labs: 03/31/2018: ALT 11; BUN 20; Creatinine, Ser 1.52; Hemoglobin 12.6; Platelets 271; Potassium 4.8; Sodium 141   Recent Lipid Panel Lab Results  Component Value Date/Time   CHOL 129 03/31/2018 11:35 AM   CHOL 130 08/19/2012 10:44 AM   TRIG 149 03/31/2018 11:35 AM   TRIG 139 03/14/2015 11:33 AM   TRIG 114 08/19/2012 10:44 AM   HDL 35 (L) 03/31/2018 11:35 AM   HDL 46 03/14/2015 11:33 AM   HDL 47 08/19/2012 10:44 AM   CHOLHDL 3.7 03/31/2018 11:35 AM   CHOLHDL 3.1 CALC 03/24/2007 11:13 AM   LDLCALC 64 03/31/2018 11:35 AM   LDLCALC 66 07/05/2013 10:39 AM   LDLCALC 60 08/19/2012 10:44 AM    Wt Readings from Last 3 Encounters:  03/31/18 238 lb (108 kg)  10/01/17 240 lb (108.9 kg)  05/29/17 235 lb (106.6 kg)     Objective:    Vital Signs:  Ht 5\' 8"  (1.727 m)   BMI 36.19 kg/m    Comfortable discussion on phone, alert.  Able to speak in full sentences.  ASSESSMENT & PLAN:    Coronary artery disease status post CABG 1998 Dr. Redmond Pulling -Overall doing well with aggressive  secondary risk factor prevention.  No increasing anginal symptoms.  Continue with aspirin, Vytorin  Carotid stenosis - Previously mild, continue with statin use.  No strokelike symptoms.  Hyperlipidemia - Statin use, prior LDL 71, excellent.  Vytorin.  Chronic diastolic heart failure - Previous NYHA class II symptoms, EF 55%.  No changes made, continue with Lasix.  Chronic kidney disease stage III/IV with diabetes - Prior creatinine 1.8.  Has been seen by Dr. Lowanda Foster in the past.  Medications reviewed.  Morbid obesity 35 with 2 or more comorbidities.  Continue to advocate for weight loss.  It is challenging for him to exercise given his spina bifida.  COVID-19 Education: The signs and symptoms of COVID-19 were discussed with the patient and how to seek care for testing (follow up with PCP or arrange E-visit).  The importance of social distancing was discussed today.  Time:   Today, I have spent 11 minutes with the patient with telehealth technology discussing the above problems.     Medication Adjustments/Labs and Tests Ordered: Current medicines are reviewed at length with the patient today.  Concerns regarding medicines are outlined above.   Tests Ordered: No orders of the defined types were placed in this encounter.   Medication Changes: No orders of the defined types were placed in this encounter.   Disposition:  Follow up in 1 year(s)  Signed, Candee Furbish, MD  06/09/2018 10:44 AM    [place]

## 2018-06-09 NOTE — Patient Instructions (Signed)
Medication Instructions:  Your provider recommends that you continue on your current medications as directed. Please refer to the Current Medication list given to you today.    Follow-Up: Your provider wants you to follow-up in: 1 year with Dr. Marlou Porch or his assistant. You will receive a reminder letter in the mail two months in advance. If you don't receive a letter, please call our office to schedule the follow-up appointment.

## 2018-06-11 ENCOUNTER — Telehealth: Payer: Medicare Other | Admitting: Cardiology

## 2018-06-23 ENCOUNTER — Other Ambulatory Visit: Payer: Self-pay | Admitting: Family Medicine

## 2018-07-06 ENCOUNTER — Encounter: Payer: Self-pay | Admitting: Family Medicine

## 2018-07-06 ENCOUNTER — Ambulatory Visit (INDEPENDENT_AMBULATORY_CARE_PROVIDER_SITE_OTHER): Payer: Medicare Other | Admitting: Family Medicine

## 2018-07-06 ENCOUNTER — Other Ambulatory Visit: Payer: Self-pay

## 2018-07-06 DIAGNOSIS — R066 Hiccough: Secondary | ICD-10-CM | POA: Diagnosis not present

## 2018-07-06 MED ORDER — PANTOPRAZOLE SODIUM 40 MG PO TBEC: 40.0000 mg | DELAYED_RELEASE_TABLET | Freq: Every day | ORAL | 0 refills | Status: DC

## 2018-07-06 NOTE — Progress Notes (Signed)
Telephone visit  Subjective: CC: hiccups PCP: Chipper Herb, MD OIB:[id number] [name]. is a 85 y.o. male calls for telephone consult today. Patient provides verbal consent for consult held via phone.  Location of patient: home Location of provider: Working remotely from home Others present for call: none  1. Hiccups Patient reports a 4-day history of intermittent hiccups.  He notes that they last for quite a while and are relieved by lemon juice or water.  He notes that the lemon juice and water only resolves the hiccups for about 3 to 4 hours before they return.  He denies any change in hearing, dizziness, headaches, difficulty swallowing, change in voice, unplanned weight loss, difficulty breathing, abdominal pain, nausea, vomiting, hematochezia or melena.  No change in medication.  He does not get woken up from sleep with hiccups.  He is able to rest throughout the night uninterrupted and does not wake up in the morning with hiccups.  They typically start sometime after breakfast.  ROS: Per HPI  Allergies  Allergen Reactions  . Aspirin     In high doses  . Sulfonamide Derivatives     REACTION: pruitis patient cant remember its been so long    . Nitrofurantoin Rash   Past Medical History:  Diagnosis Date  . Agent orange exposure 1966 or 1971  . Anemia   . Anxiety   . Arthritis    "hands and back" (01/20/2013)  . CAD (coronary artery disease)    native vessel  . Carotid artery disease (HCC)    nonobstructive  . Cataract   . Cecal diverticulitis 2008   drained  . Cellulitis, gluteal    bilateral for the past 6 months/notes 01/20/2013  . Chronic lower back pain   . Chronic renal insufficiency   . Depression   . Diabetes mellitus type II   . Exertional shortness of breath   . HTN (hypertension)   . Hyperlipidemia   . Hypokalemia   . Malnutrition ([place])    protein-calorie  . Myocardial infarction [facility])    "silent; before OHS" (01/20/2013)  . PTSD (post-traumatic  stress disorder)   . Spina bifida ([place])   . Urinary incontinence     Current Outpatient Medications:  .  aspirin 81 MG tablet, Take 81 mg by mouth daily.  , Disp: , Rfl:  .  buPROPion (WELLBUTRIN XL) 300 MG 24 hr tablet, TAKE 1 TABLET DAILY, Disp: 90 tablet, Rfl: 4 .  Coenzyme Q10 (CO Q-10) 100 MG CAPS, Take 1 capsule by mouth 2 (two) times daily. , Disp: , Rfl:  .  glipiZIDE (GLUCOTROL XL) 10 MG 24 hr tablet, TAKE 1 TABLET DAILY, Disp: 90 tablet, Rfl: 0 .  JANUMET 50-500 MG tablet, TAKE 1 TABLET DAILY WITH FOOD, Disp: 90 tablet, Rfl: 0 .  metoprolol tartrate (LOPRESSOR) 25 MG tablet, TAKE 1 TABLET TWICE A DAY, Disp: 180 tablet, Rfl: 0 .  Multiple Vitamins-Minerals (PRESERVISION AREDS 2 PO), Take 1 capsule by mouth 2 (two) times daily., Disp: , Rfl:  .  nitroGLYCERIN (NITROSTAT) 0.4 MG SL tablet, Place 1 tablet (0.4 mg total) under the tongue every 5 (five) minutes as needed., Disp: 25 tablet, Rfl: 6 .  NON FORMULARY, cran-actin 100mg  1 tablet daily, Disp: , Rfl:  .  NON FORMULARY, Sweetish Bitters Daily as needed, Disp: , Rfl:  .  Omega-3 Fatty Acids (FISH OIL) 1000 MG CAPS, Take 1 capsule by mouth 2 (two) times daily.  , Disp: , Rfl:  .  polyethylene glycol powder (GLYCOLAX/MIRALAX) powder, Take 17 g by mouth 2 (two) times daily as needed., Disp: 850 g, Rfl: 3 .  sodium bicarbonate 650 MG tablet, Take 650 mg by mouth 2 (two) times daily. , Disp: , Rfl: 4 .  VYTORIN 10-20 MG tablet, TAKE 1 TABLET DAILY AS DIRECTED, Disp: 90 tablet, Rfl: 0  Assessment/ Plan: 85 y.o. male   1. Intractable hiccups Of uncertain etiology.  I do not see any medications that would be contributing to hiccups.  We discussed that we could empirically try a PPI to see if perhaps this would resolve his symptoms.  He has renal impairment but Protonix does not need to be renally dosed.  We will try this for the next couple of weeks and have him follow-up in office so that I can do a full physical exam on him to rule out  other causes of intractable hiccups.  We discussed that malignant etiology could contribute and that this should not be missed.  He voiced good understanding and most certainly would be willing to come into the office.  We will plan to schedule him for 07/22/2018 at 1:30 PM with me as his PCP is not available at this time in office.  We discussed that if symptoms worsen or do not improve over the next several days that he is to see me sooner. - pantoprazole (PROTONIX) 40 MG tablet; Take 1 tablet (40 mg total) by mouth daily.  Dispense: 30 tablet; Refill: 0   Start time: 1:05pm End time: 1:20pm  Total time spent on patient care (including telephone call/ virtual visit): 18 minutes  [place], [place] [phone]

## 2018-07-08 ENCOUNTER — Encounter: Payer: Self-pay | Admitting: Nurse Practitioner

## 2018-07-08 ENCOUNTER — Ambulatory Visit (INDEPENDENT_AMBULATORY_CARE_PROVIDER_SITE_OTHER): Payer: Medicare Other | Admitting: Nurse Practitioner

## 2018-07-08 ENCOUNTER — Telehealth: Payer: Self-pay | Admitting: Family Medicine

## 2018-07-08 ENCOUNTER — Other Ambulatory Visit: Payer: Self-pay

## 2018-07-08 VITALS — BP 131/70 | HR 74 | Temp 97.1°F

## 2018-07-08 DIAGNOSIS — I6523 Occlusion and stenosis of bilateral carotid arteries: Secondary | ICD-10-CM

## 2018-07-08 DIAGNOSIS — R066 Hiccough: Secondary | ICD-10-CM | POA: Diagnosis not present

## 2018-07-08 MED ORDER — CHLORPROMAZINE HCL 25 MG PO TABS: 25.0000 mg | ORAL_TABLET | Freq: Three times a day (TID) | ORAL | 0 refills | Status: DC

## 2018-07-08 NOTE — Telephone Encounter (Signed)
Please have patient come in this afternoon to be seen for further evaluation for intractable hiccups

## 2018-07-08 NOTE — Telephone Encounter (Signed)
appt made for pt to be seen today by MMM

## 2018-07-08 NOTE — Telephone Encounter (Signed)
PT had visit with Dr Darnell Level other day hiccups are no better was told to call back if no change, would like to speak to nurse

## 2018-07-08 NOTE — Patient Instructions (Signed)
Hiccups    A hiccup is the result of a sudden irritation of a muscle that is used for breathing (diaphragm). The diaphragm is located under your lungs and above your stomach. When the diaphragm gets irritated, it may quickly tighten without your control (have a spasm). The spasm causes you to quickly suck in air, and that causes your vocal cords to close together quickly. These reactions cause the hiccup sound. Hiccups usually last only a short amount of time (less than 48 hours). In unusual cases, they can last for days or months and require you to see your health care provider.  Common causes of hiccups include:  Eating too quickly.  Eating too much food.  Drinking alcohol or bubbly (carbonated) drinks.  Eating or drinking hot or spicy foods and drinks.  Swallowing extra air when sucking on candy or a straw or chewing on gum.  Feeling nervous, stressed, or excited.  Having certain conditions that irritate the diaphragm nerves.  Having metabolic or nervous system disorders.  Follow these instructions at home:  Watch your hiccups for any changes.  To prevent hiccups or to lessen discomfort from hiccups:  Eat and chew your food slowly.  Eat small meals, and avoid overeating.  Limit alcohol intake to no more than 1 drink a day for nonpregnant women and 2 drinks a day for men. One drink equals 12 oz of beer, 5 oz of wine, or 1½ oz of hard liquor.  Limit your drinking of carbonated or fizzy drinks, such as soda.  Avoid eating or drinking hot or spicy foods and drinks.  Take over-the-counter and prescription medicines only as told by your health care provider.  Contact a health care provider if:  Your hiccups last for more than 48 hours.  Your hiccups do not improve with treatment.  You cannot sleep or eat because of the hiccups.  You have unexpected weight loss due to the hiccups.  You have numbness, tingling, or weakness.  Get help right away if:  You have trouble breathing or swallowing.  You have severe pain in your  abdomen.  Summary  A hiccup is the result of a sudden irritation of a muscle that is used for breathing (diaphragm).  Hiccups can be caused by many things, including eating too quickly.  See your health care provider if your hiccups last for more than 48 hours.  This information is not intended to replace advice given to you by your health care provider. Make sure you discuss any questions you have with your health care provider.  Document Released: 04/21/2001 Document Revised: 10/20/2016 Document Reviewed: 10/20/2016  Elsevier Interactive Patient Education © 2019 Elsevier Inc.

## 2018-07-08 NOTE — Progress Notes (Signed)
   Subjective:    Patient ID: [name]., male    DOB: 08-05-1930, 85 y.o.   MRN: [id number]   Chief Complaint: Hiccups   HPI Patient comes in today c/o hiccups for 4 days. He had a telephone visit with Dr. Lajuana Ripple on 5/12/2. She was unable to determine cause of hiccups but did not feel that it was related to any of his medicines. She gave him protonix to see if it would help but it has not helped at all. pomegranite juice will help some but does not last long and hiccups come right back Dr, Laurance Flatten wanted him to come in and be seen face to face.    Review of Systems  Constitutional: Negative.   Respiratory: Negative.   Cardiovascular: Negative.   Genitourinary: Negative.   Neurological: Negative.   Psychiatric/Behavioral: Negative.   All other systems reviewed and are negative.      Objective:   Physical Exam Constitutional:      Appearance: Normal appearance.  Neck:     Musculoskeletal: Normal range of motion.  Cardiovascular:     Rate and Rhythm: Normal rate and regular rhythm.     Heart sounds: Normal heart sounds.     Comments: Has hiccups constantly during exam Pulmonary:     Effort: Pulmonary effort is normal.     Breath sounds: Normal breath sounds.  Abdominal:     Palpations: Abdomen is soft.  Skin:    General: Skin is warm and dry.  Neurological:     General: No focal deficit present.     Mental Status: He is alert and oriented to person, place, and time.  Psychiatric:        Mood and Affect: Mood normal.        Behavior: Behavior normal.    BP 131/70   Pulse 74   Temp (!) 97.1 F (36.2 C) (Oral)         Assessment & Plan:  [name]. in today with chief complaint of Hiccups (4 days)   1. Intractable hiccups Take 1/2 tablet of wellbutrin while on thorazine RTO prn Fall precautions No dose adjustment needed on thorazine due to kidney problems. - chlorproMAZINE (THORAZINE) 25 MG tablet; Take 1 tablet (25 mg total) by mouth 3  (three) times daily.  Dispense: 30 tablet; Refill: 0  Mary-Margaret Hassell Done, FNP

## 2018-07-22 ENCOUNTER — Ambulatory Visit: Payer: Medicare Other | Admitting: Family Medicine

## 2018-07-26 DIAGNOSIS — H353221 Exudative age-related macular degeneration, left eye, with active choroidal neovascularization: Secondary | ICD-10-CM | POA: Diagnosis not present

## 2018-08-03 DIAGNOSIS — H353211 Exudative age-related macular degeneration, right eye, with active choroidal neovascularization: Secondary | ICD-10-CM | POA: Diagnosis not present

## 2018-08-18 ENCOUNTER — Other Ambulatory Visit: Payer: Self-pay

## 2018-08-18 ENCOUNTER — Ambulatory Visit (INDEPENDENT_AMBULATORY_CARE_PROVIDER_SITE_OTHER): Payer: Medicare Other | Admitting: Family Medicine

## 2018-08-18 ENCOUNTER — Encounter: Payer: Self-pay | Admitting: Family Medicine

## 2018-08-18 DIAGNOSIS — F4310 Post-traumatic stress disorder, unspecified: Secondary | ICD-10-CM

## 2018-08-18 DIAGNOSIS — D508 Other iron deficiency anemias: Secondary | ICD-10-CM

## 2018-08-18 DIAGNOSIS — E1121 Type 2 diabetes mellitus with diabetic nephropathy: Secondary | ICD-10-CM

## 2018-08-18 DIAGNOSIS — I10 Essential (primary) hypertension: Secondary | ICD-10-CM

## 2018-08-18 DIAGNOSIS — R29898 Other symptoms and signs involving the musculoskeletal system: Secondary | ICD-10-CM

## 2018-08-18 DIAGNOSIS — I5032 Chronic diastolic (congestive) heart failure: Secondary | ICD-10-CM

## 2018-08-18 DIAGNOSIS — N184 Chronic kidney disease, stage 4 (severe): Secondary | ICD-10-CM | POA: Diagnosis not present

## 2018-08-18 DIAGNOSIS — Q057 Lumbar spina bifida without hydrocephalus: Secondary | ICD-10-CM

## 2018-08-18 DIAGNOSIS — Q056 Thoracic spina bifida without hydrocephalus: Secondary | ICD-10-CM | POA: Diagnosis not present

## 2018-08-18 DIAGNOSIS — Z7409 Other reduced mobility: Secondary | ICD-10-CM | POA: Diagnosis not present

## 2018-08-18 DIAGNOSIS — E782 Mixed hyperlipidemia: Secondary | ICD-10-CM

## 2018-08-18 DIAGNOSIS — I6523 Occlusion and stenosis of bilateral carotid arteries: Secondary | ICD-10-CM

## 2018-08-18 DIAGNOSIS — E785 Hyperlipidemia, unspecified: Secondary | ICD-10-CM

## 2018-08-18 DIAGNOSIS — I739 Peripheral vascular disease, unspecified: Secondary | ICD-10-CM

## 2018-08-18 DIAGNOSIS — E559 Vitamin D deficiency, unspecified: Secondary | ICD-10-CM

## 2018-08-18 DIAGNOSIS — E1169 Type 2 diabetes mellitus with other specified complication: Secondary | ICD-10-CM

## 2018-08-18 NOTE — Progress Notes (Signed)
Virtual Visit Via telephone Note I connected with@ on 08/18/18 by telephone and verified that I am speaking with the correct person or authorized healthcare agent using two identifiers. [name]. is currently located at home and there are no unauthorized people in close proximity. I completed this visit while in a private location in my home .  This visit type was conducted due to national recommendations for restrictions regarding the COVID-19 Pandemic (e.g. social distancing).  This format is felt to be most appropriate for this patient at this time.  All issues noted in this document were discussed and addressed.  No physical exam was performed.    I discussed the limitations, risks, security and privacy concerns of performing an evaluation and management service by telephone and the availability of in person appointments. I also discussed with the patient that there may be a patient responsible charge related to this service. The patient expressed understanding and agreed to proceed.   Date:  08/18/2018    ID:  [name].      [birth year]        [id number]   Patient Care Team Patient Care Team: Chipper Herb, MD as PCP - General (Family Medicine) Jerline Pain, MD as PCP - Cardiology (Cardiology) Kathie Rhodes, MD as Consulting Physician (Urology) Larey Dresser, MD as Consulting Physician (Cardiology) Steffanie Rainwater, DPM as Consulting Physician (Podiatry) Ralene Muskrat as Physician Assistant (Chiropractic Medicine) Norma Fredrickson, MD as Consulting Physician (Psychiatry) Fran Lowes, MD as Consulting Physician (Nephrology) Mikey Bussing, DDS (Dentistry) Ricard Dillon, MD as Consulting Physician (Internal Medicine) Marygrace Drought, MD as Consulting Physician (Ophthalmology) Harriett Sine, MD as Consulting Physician (Dermatology)  Reason for Visit: Primary Care Follow-up     History of Present Illness & Review of Systems:     [name]. is a 85 y.o. year old male primary care patient that presents today for a telehealth visit.  The patient is pleasant and alert and somewhat upset this morning because his wife passed out at home.  She is better now.  He will call and get an appoint for her to be seen as soon as possible to evaluate why she fell.  The patient today denies any chest pain pressure tightness or shortness of breath.  He denies any trouble with his stomach including swallowing heartburn indigestion nausea vomiting diarrhea blood in the stool or black tarry bowel movements or change in bowel habits.  He is taking MiraLAX and this keeps his bowels working regularly.  He is passing his water well currently and is not having any symptoms with burning pain blood in the urine or any more frequency than usual.  He currently sees Dr. Karsten Ro when he does have problems.  He sees Dr. Marlou Porch for his cardiology issues and he sees Dr. Lowanda Foster his nephrologist every 3 months.  Review of systems as stated, otherwise negative.  The patient does not have symptoms concerning for COVID-19 infection (fever, chills, cough, or new shortness of breath).      Current Medications (Verified) Allergies as of 08/18/2018      Reactions   Aspirin    In high doses   Sulfonamide Derivatives    REACTION: pruitis patient cant remember its been so long    Nitrofurantoin Rash      Medication List       Accurate as of August 18, 2018 10:51 AM. If you have any questions, ask  your nurse or doctor.        aspirin 81 MG tablet Take 81 mg by mouth daily.   buPROPion 300 MG 24 hr tablet Commonly known as: WELLBUTRIN XL TAKE 1 TABLET DAILY   chlorproMAZINE 25 MG tablet Commonly known as: THORAZINE Take 1 tablet (25 mg total) by mouth 3 (three) times daily.   Co Q-10 100 MG Caps Take 1 capsule by mouth 2 (two) times daily.   Fish Oil 1000 MG Caps Take 1 capsule by mouth 2 (two) times daily.   glipiZIDE 10 MG 24 hr tablet Commonly  known as: GLUCOTROL XL TAKE 1 TABLET DAILY   Janumet 50-500 MG tablet Generic drug: sitaGLIPtin-metformin TAKE 1 TABLET DAILY WITH FOOD   metoprolol tartrate 25 MG tablet Commonly known as: LOPRESSOR TAKE 1 TABLET TWICE A DAY   nitroGLYCERIN 0.4 MG SL tablet Commonly known as: NITROSTAT Place 1 tablet (0.4 mg total) under the tongue every 5 (five) minutes as needed.   NON FORMULARY Sweetish Bitters Daily as needed   NON FORMULARY cran-actin 100mg  1 tablet daily   pantoprazole 40 MG tablet Commonly known as: PROTONIX Take 1 tablet (40 mg total) by mouth daily.   polyethylene glycol powder 17 GM/SCOOP powder Commonly known as: GLYCOLAX/MIRALAX Take 17 g by mouth 2 (two) times daily as needed.   PRESERVISION AREDS 2 PO Take 1 capsule by mouth 2 (two) times daily.   sodium bicarbonate 650 MG tablet Take 650 mg by mouth 2 (two) times daily.   Vytorin 10-20 MG tablet Generic drug: ezetimibe-simvastatin TAKE 1 TABLET DAILY AS DIRECTED           Allergies (Verified)    Aspirin, Sulfonamide derivatives, and Nitrofurantoin  Past Medical History Past Medical History:  Diagnosis Date  . Agent orange exposure 1966 or 1971  . Anemia   . Anxiety   . Arthritis    "hands and back" (01/20/2013)  . CAD (coronary artery disease)    native vessel  . Carotid artery disease (HCC)    nonobstructive  . Cataract   . Cecal diverticulitis 2008   drained  . Cellulitis, gluteal    bilateral for the past 6 months/notes 01/20/2013  . Chronic lower back pain   . Chronic renal insufficiency   . Depression   . Diabetes mellitus type II   . Exertional shortness of breath   . HTN (hypertension)   . Hyperlipidemia   . Hypokalemia   . Malnutrition ([place])    protein-calorie  . Myocardial infarction [facility])    "silent; before OHS" (01/20/2013)  . PTSD (post-traumatic stress disorder)   . Spina bifida ([place])   . Urinary incontinence      Past Surgical History:  Procedure  Laterality Date  . CARDIAC CATHETERIZATION  1998   "couple before my OHS" (01/20/2013)  . CARPAL TUNNEL RELEASE Right 1980's  . CATARACT EXTRACTION W/ INTRAOCULAR LENS  IMPLANT, BILATERAL Bilateral ~ 2010  . CORONARY ARTERY BYPASS GRAFT  1998   "CABG X4" (01/20/2013)  . CYSTECTOMY  2000's   "cytal cyst on my intestines; probed then drained it; hospitalized for 13 days; NPO" (01/20/2013)  . EYE SURGERY    . HEMORRHOID BANDING  ~ 10/2012  . KNEE SURGERY Left 1964   "exploratory; sewed it up w/wire" (01/20/2013)  . PROSTATE SURGERY    . SPINE SURGERY  [birth year]1932   "Spina bifida surgery"  . STRABISMUS SURGERY Bilateral 03/29/2015   Procedure: REPAIR STRABISMUS BILATERAL;  Surgeon: Lamonte Sakai, MD;  Location: [facility];  Service: Ophthalmology;  Laterality: Bilateral;    Social History   Socioeconomic History  . Marital status: Married    Spouse name: Not on file  . Number of children: Not on file  . Years of education: Not on file  . Highest education level: Not on file  Occupational History  . Occupation: Retired    Comment: Nurse, learning disability  . Financial resource strain: Not on file  . Food insecurity    Worry: Not on file    Inability: Not on file  . Transportation needs    Medical: Not on file    Non-medical: Not on file  Tobacco Use  . Smoking status: Former Smoker    Packs/day: 2.00    Years: 20.00    Pack years: 40.00    Types: Cigarettes    Start date: 11/20/1948    Quit date: 08/19/1980    Years since quitting: 38.0  . Smokeless tobacco: Former Systems developer    Types: Chew  . Tobacco comment: 01/20/2013 "quit chewing 20 yr ago"  Substance and Sexual Activity  . Alcohol use: No    Comment: 01/20/2013 "quit drinking 08/19/1980"  . Drug use: No  . Sexual activity: Not Currently  Lifestyle  . Physical activity    Days per week: Not on file    Minutes per session: Not on file  . Stress: Not on file  Relationships  . Social Herbalist on  phone: Not on file    Gets together: Not on file    Attends religious service: Not on file    Active member of club or organization: Not on file    Attends meetings of clubs or organizations: Not on file    Relationship status: Not on file  Other Topics Concern  . Not on file  Social History Narrative  . Not on file     Family History  Problem Relation Age of Onset  . Heart disease Mother   . Heart attack Mother   . Heart disease Father   . Heart attack Father   . Cancer Sister        breast  . Cancer Sister        lung and ovarian      Labs/Other Tests and Data Reviewed:    Wt Readings from Last 3 Encounters:  03/31/18 238 lb (108 kg)  10/01/17 240 lb (108.9 kg)  05/29/17 235 lb (106.6 kg)   Temp Readings from Last 3 Encounters:  07/08/18 (!) 97.1 F (36.2 C) (Oral)  03/31/18 (!) 97 F (36.1 C) (Oral)  10/13/17 (!) 97.1 F (36.2 C) (Oral)   BP Readings from Last 3 Encounters:  07/08/18 131/70  03/31/18 115/71  10/13/17 127/79   Pulse Readings from Last 3 Encounters:  07/08/18 74  03/31/18 81  10/13/17 78     Lab Results  Component Value Date   HGBA1C 6.4 03/31/2018   HGBA1C 7.0 (H) 10/01/2017   HGBA1C 7.6 (H) 05/29/2017   Lab Results  Component Value Date   MICROALBUR 20 03/08/2013   LDLCALC 64 03/31/2018   CREATININE 1.52 (H) 03/31/2018       Chemistry      Component Value Date/Time   NA 141 03/31/2018 1135   K 4.8 03/31/2018 1135   CL 103 03/31/2018 1135   CO2 19 (L) 03/31/2018 1135   BUN 20 03/31/2018 1135   CREATININE 1.52 (H) 03/31/2018 1135  CREATININE 1.58 (H) 08/19/2012 1044      Component Value Date/Time   CALCIUM 9.2 03/31/2018 1135   ALKPHOS 100 03/31/2018 1135   AST 14 03/31/2018 1135   ALT 11 03/31/2018 1135   BILITOT 0.3 03/31/2018 1135         OBSERVATIONS/ OBJECTIVE:     The patient is alert and responding appropriately to questions asked of him today.  He has no blood pressure readings for review.  His  weight is 235 and this was about 1 week ago.  His fasting blood sugars have been averaging about 90.  His feet have no edema or signs of infection.  He has an eye appointment coming up in July with Dr. Satira Sark.  He sees his urologist only as needed and this is Dr. Ivette Loyal.  He sees Dr. Marlou Porch on a yearly basis and his next visit with him will be next spring 2021.  He sees Dr. Lowanda Foster every 3 months his nephrologist.  Physical exam deferred due to nature of telephonic visit.  ASSESSMENT & PLAN    Time:   Today, I have spent 25 minutes with the patient via telephone discussing the above including Covid precautions.     Visit Diagnoses: 1. Essential hypertension -Continue to watch sodium intake and check blood pressures if possible at home  2. Chronic diastolic CHF (congestive heart failure) (HCC) -This is apparently stable for him and a last visit with Dr. Marlou Porch was in April and he plans to see him back again in April 2021.  3. Type 2 diabetes mellitus with diabetic nephropathy, unspecified whether long term insulin use ([place]) -He had lab work and continue with current treatment as blood sugars have been doing well at home averaging about 90 for fasting.  4. Spina bifida of thoracolumbar region, unspecified hydrocephalus presence ([place]) -Follow-up with neurology as needed  5. Chronic kidney disease, stage IV (severe) (HCC) -Follow-up with [facility] every 3 months as planned  6. PTSD -Anxiety issues appear to be stable  7. Other iron deficiency anemia -Check CBC  8. Type 2 diabetes mellitus with diabetic nephropathy, without long-term current use of insulin (HCC) -Check blood sugars regularly and watch therapeutic lifestyle changes as closely as possible including diet and exercise  9. Mixed hyperlipidemia -Continue with current treatment pending results of lab work and follow as aggressive therapeutic lifestyle changes as possible  10. Immobility -Continue to be careful and not put  self at risk for falling.  Use mobility equipment regularly because of spina bifida  11. Bilateral leg weakness -This is all due to spina bifida and everything is stable according to the patient.  76. Spina bifida of lumbar region without hydrocephalus [facility]) -Follow-up as needed with neurosurgery or neurology and urology.  13. Peripheral vascular insufficiency (HCC) -No complaints with leg pain today  14. Type 2 diabetes mellitus with hyperlipidemia (HCC) -Sugars have been stable recently and he will continue to monitor blood sugars as well as feet on a regular basis.  Patient Instructions  Continue to practice good hand and respiratory hygiene Follow-up regularly with cardiology as planned Follow-up with urology as needed Follow-up with PCP at least every 4 months Check blood sugars regularly Make every effort not to put self at risk for falling Use motorized wheelchair and all devices needed to assist in movement in the home. Continue to drink plenty of water and fluids     The above assessment and management plan was discussed with the patient. The patient verbalized understanding  of and has agreed to the management plan. Patient is aware to call the clinic if symptoms persist or worsen. Patient is aware when to return to the clinic for a follow-up visit. Patient educated on when it is appropriate to go to the emergency department.    Chipper Herb, MD [address], [place], [place] [address] Ph [phone]   Arrie Senate MD

## 2018-08-18 NOTE — Patient Instructions (Addendum)
Continue to practice good hand and respiratory hygiene Follow-up regularly with cardiology as planned Follow-up with urology as needed Follow-up with PCP at least every 4 months Check blood sugars regularly Make every effort not to put self at risk for falling Use motorized wheelchair and all devices needed to assist in movement in the home. Continue to drink plenty of water and fluids

## 2018-08-19 NOTE — Addendum Note (Signed)
Addended by: Zannie Cove on: 08/19/2018 09:59 AM   Modules accepted: Orders

## 2018-08-31 DIAGNOSIS — H353221 Exudative age-related macular degeneration, left eye, with active choroidal neovascularization: Secondary | ICD-10-CM | POA: Diagnosis not present

## 2018-09-09 ENCOUNTER — Telehealth: Payer: Self-pay | Admitting: Family Medicine

## 2018-09-09 NOTE — Telephone Encounter (Signed)
Appointment scheduled and paperwork mailed for wife to fill out for new patient.

## 2018-09-13 ENCOUNTER — Other Ambulatory Visit: Payer: Self-pay | Admitting: *Deleted

## 2018-09-13 MED ORDER — VYTORIN 10-20 MG PO TABS: 1.0000 | ORAL_TABLET | Freq: Every day | ORAL | 1 refills | Status: DC

## 2018-09-15 ENCOUNTER — Other Ambulatory Visit: Payer: Self-pay | Admitting: *Deleted

## 2018-09-15 MED ORDER — VYTORIN 10-20 MG PO TABS: 1.0000 | ORAL_TABLET | Freq: Every day | ORAL | 1 refills | Status: DC

## 2018-09-21 ENCOUNTER — Other Ambulatory Visit: Payer: Self-pay | Admitting: Family Medicine

## 2018-09-23 ENCOUNTER — Telehealth: Payer: Self-pay | Admitting: Family Medicine

## 2018-09-24 DIAGNOSIS — H353211 Exudative age-related macular degeneration, right eye, with active choroidal neovascularization: Secondary | ICD-10-CM | POA: Diagnosis not present

## 2018-09-24 MED ORDER — EZETIMIBE-SIMVASTATIN 10-20 MG PO TABS: 1.0000 | ORAL_TABLET | Freq: Every day | ORAL | 1 refills | Status: DC

## 2018-09-24 NOTE — Telephone Encounter (Signed)
Called pt and asked if there was a medical reason why he couldn't take the generic Vytorin and pt said he has never tried it. Also a document scanned in media on 04/08/18 [facility] had signed saying generic is appropriate. Rx for generic sent to [facility] and pt is aware. Called and cancelled the Vytorin brand Rx.

## 2018-10-11 ENCOUNTER — Telehealth: Payer: Self-pay | Admitting: Family Medicine

## 2018-10-11 NOTE — Telephone Encounter (Signed)
Patient notified to fast for labs. Patient verbalized understanding

## 2018-10-13 ENCOUNTER — Telehealth: Payer: Self-pay | Admitting: Family Medicine

## 2018-10-14 NOTE — Telephone Encounter (Signed)
Clarified w/ patient that he will not be getting the Brand name Vytorin anymore that he will be getting the generic. This was TC encounter 09/23/18.

## 2018-10-20 ENCOUNTER — Encounter: Payer: Self-pay | Admitting: Family Medicine

## 2018-10-20 ENCOUNTER — Ambulatory Visit (INDEPENDENT_AMBULATORY_CARE_PROVIDER_SITE_OTHER): Payer: Medicare Other | Admitting: Family Medicine

## 2018-10-20 DIAGNOSIS — I2510 Atherosclerotic heart disease of native coronary artery without angina pectoris: Secondary | ICD-10-CM

## 2018-10-20 DIAGNOSIS — D72828 Other elevated white blood cell count: Secondary | ICD-10-CM

## 2018-10-20 DIAGNOSIS — E1121 Type 2 diabetes mellitus with diabetic nephropathy: Secondary | ICD-10-CM | POA: Diagnosis not present

## 2018-10-20 DIAGNOSIS — N183 Chronic kidney disease, stage 3 unspecified: Secondary | ICD-10-CM

## 2018-10-20 DIAGNOSIS — I10 Essential (primary) hypertension: Secondary | ICD-10-CM | POA: Diagnosis not present

## 2018-10-20 DIAGNOSIS — I6523 Occlusion and stenosis of bilateral carotid arteries: Secondary | ICD-10-CM

## 2018-10-20 DIAGNOSIS — E1122 Type 2 diabetes mellitus with diabetic chronic kidney disease: Secondary | ICD-10-CM

## 2018-10-20 DIAGNOSIS — H353221 Exudative age-related macular degeneration, left eye, with active choroidal neovascularization: Secondary | ICD-10-CM | POA: Diagnosis not present

## 2018-10-20 MED ORDER — BUPROPION HCL ER (XL) 300 MG PO TB24: 300.0000 mg | ORAL_TABLET | Freq: Every day | ORAL | 3 refills | Status: DC

## 2018-10-20 MED ORDER — METOPROLOL TARTRATE 25 MG PO TABS: 25.0000 mg | ORAL_TABLET | Freq: Two times a day (BID) | ORAL | 3 refills | Status: DC

## 2018-10-20 MED ORDER — EZETIMIBE-SIMVASTATIN 10-20 MG PO TABS: 1.0000 | ORAL_TABLET | Freq: Every day | ORAL | 3 refills | Status: DC

## 2018-10-20 MED ORDER — GLIPIZIDE ER 10 MG PO TB24: 10.0000 mg | ORAL_TABLET | Freq: Every day | ORAL | 3 refills | Status: DC

## 2018-10-20 MED ORDER — JANUMET 50-500 MG PO TABS: 1.0000 | ORAL_TABLET | Freq: Every day | ORAL | 3 refills | Status: DC

## 2018-10-20 NOTE — Progress Notes (Signed)
Virtual Visit via telephone Note  I connected with [name]. on 10/20/18 at 1347 by telephone and verified that I am speaking with the correct person using two identifiers. [name]. is currently located at home and no other people are currently with her during visit. The provider, Fransisca Kaufmann , MD is located in their office at time of visit.  Call ended at 1408  I discussed the limitations, risks, security and privacy concerns of performing an evaluation and management service by telephone and the availability of in person appointments. I also discussed with the patient that there may be a patient responsible charge related to this service. The patient expressed understanding and agreed to proceed.   History and Present Illness: Type 2 diabetes mellitus Patient comes in today for recheck of his diabetes. Patient has been currently taking janumet and glipizide and had bs 66 about once every 6 months. Patient is not currently on an ACE inhibitor/ARB. Patient has not seen an ophthalmologist this year. Patient denies any issues with their feet.   Hyperlipidemia Patient is coming in for recheck of his hyperlipidemia. The patient is currently taking vytorin. They deny any issues with myalgias or history of liver damage from it. They deny any focal numbness or weakness or chest pain.   Hypertension Patient is currently on metoprolol, and their blood pressure today is unknown because he does not have a machine. Patient denies any lightheadedness or dizziness. Patient denies headaches, blurred vision, chest pains, shortness of breath, or weakness. Denies any side effects from medication and is content with current medication.   No diagnosis found.  Outpatient Encounter Medications as of 10/20/2018  Medication Sig  . aspirin 81 MG tablet Take 81 mg by mouth daily.    Marland Kitchen buPROPion (WELLBUTRIN XL) 300 MG 24 hr tablet TAKE 1 TABLET DAILY  . chlorproMAZINE (THORAZINE) 25 MG  tablet Take 1 tablet (25 mg total) by mouth 3 (three) times daily. (Patient not taking: Reported on 08/18/2018)  . Coenzyme Q10 (CO Q-10) 100 MG CAPS Take 1 capsule by mouth 2 (two) times daily.   Marland Kitchen ezetimibe-simvastatin (VYTORIN) 10-20 MG tablet Take 1 tablet by mouth daily.  Marland Kitchen glipiZIDE (GLUCOTROL XL) 10 MG 24 hr tablet TAKE 1 TABLET DAILY  . JANUMET 50-500 MG tablet TAKE 1 TABLET DAILY WITH FOOD  . metoprolol tartrate (LOPRESSOR) 25 MG tablet TAKE 1 TABLET TWICE A DAY  . Multiple Vitamins-Minerals (PRESERVISION AREDS 2 PO) Take 1 capsule by mouth 2 (two) times daily.  . nitroGLYCERIN (NITROSTAT) 0.4 MG SL tablet Place 1 tablet (0.4 mg total) under the tongue every 5 (five) minutes as needed.  . NON FORMULARY cran-actin 151m 1 tablet daily  . NON FORMULARY Sweetish Bitters Daily as needed  . Omega-3 Fatty Acids (FISH OIL) 1000 MG CAPS Take 1 capsule by mouth 2 (two) times daily.    . pantoprazole (PROTONIX) 40 MG tablet Take 1 tablet (40 mg total) by mouth daily. (Patient not taking: Reported on 08/18/2018)  . polyethylene glycol powder (GLYCOLAX/MIRALAX) powder Take 17 g by mouth 2 (two) times daily as needed.  . sodium bicarbonate 650 MG tablet Take 650 mg by mouth 2 (two) times daily.    No facility-administered encounter medications on file as of 10/20/2018.     Review of Systems  Constitutional: Negative for chills and fever.  Eyes: Negative for visual disturbance.  Respiratory: Negative for shortness of breath and wheezing.   Cardiovascular: Negative for chest pain  and leg swelling.  Musculoskeletal: Negative for back pain and gait problem.  Skin: Negative for rash.  Neurological: Negative for dizziness, weakness and light-headedness.  All other systems reviewed and are negative.   Observations/Objective: Patient sounds comfortable and in no acute distress  Assessment and Plan: Problem List Items Addressed This Visit      Cardiovascular and Mediastinum   Essential  hypertension   Relevant Medications   metoprolol tartrate (LOPRESSOR) 25 MG tablet   ezetimibe-simvastatin (VYTORIN) 10-20 MG tablet   Other Relevant Orders   CMP14+EGFR     Endocrine   Type 2 diabetes mellitus with diabetic nephropathy (HCC)   Relevant Medications   sitaGLIPtin-metformin (JANUMET) 50-500 MG tablet   glipiZIDE (GLUCOTROL XL) 10 MG 24 hr tablet   Other Relevant Orders   Bayer DCA Hb A1c Waived   Type 2 diabetes mellitus with stage 3 chronic kidney disease, without long-term current use of insulin (HCC) - Primary   Relevant Medications   sitaGLIPtin-metformin (JANUMET) 50-500 MG tablet   glipiZIDE (GLUCOTROL XL) 10 MG 24 hr tablet   Other Relevant Orders   Bayer DCA Hb A1c Waived     Other   CAD, NATIVE VESSEL   Relevant Orders   Lipid panel   Leukocytosis   Relevant Orders   CBC with Differential/Platelet       Follow Up Instructions:  4 month recheck and continue current medications, watch low blood sugar   I discussed the assessment and treatment plan with the patient. The patient was provided an opportunity to ask questions and all were answered. The patient agreed with the plan and demonstrated an understanding of the instructions.   The patient was advised to call back or seek an in-person evaluation if the symptoms worsen or if the condition fails to improve as anticipated.  The above assessment and management plan was discussed with the patient. The patient verbalized understanding of and has agreed to the management plan. Patient is aware to call the clinic if symptoms persist or worsen. Patient is aware when to return to the clinic for a follow-up visit. Patient educated on when it is appropriate to go to the emergency department.    I provided 21 minutes of non-face-to-face time during this encounter.    Worthy Rancher, MD

## 2018-11-02 ENCOUNTER — Encounter: Payer: Self-pay | Admitting: Family Medicine

## 2018-11-02 ENCOUNTER — Ambulatory Visit (INDEPENDENT_AMBULATORY_CARE_PROVIDER_SITE_OTHER): Payer: Medicare Other | Admitting: Family Medicine

## 2018-11-02 ENCOUNTER — Other Ambulatory Visit: Payer: Self-pay

## 2018-11-02 DIAGNOSIS — B9789 Other viral agents as the cause of diseases classified elsewhere: Secondary | ICD-10-CM | POA: Diagnosis not present

## 2018-11-02 DIAGNOSIS — J988 Other specified respiratory disorders: Secondary | ICD-10-CM | POA: Diagnosis not present

## 2018-11-02 MED ORDER — GUAIFENESIN ER 600 MG PO TB12: 600.0000 mg | ORAL_TABLET | Freq: Two times a day (BID) | ORAL | 0 refills | Status: DC

## 2018-11-02 NOTE — Addendum Note (Signed)
Addended by: Hendricks Limes F on: 11/02/2018 01:50 PM   Modules accepted: Orders

## 2018-11-02 NOTE — Patient Instructions (Signed)
Viral Respiratory Infection A viral respiratory infection is an illness that affects parts of the body that are used for breathing. These include the lungs, nose, and throat. It is caused by a germ called a virus. Some examples of this kind of infection are:  A cold.  The flu (influenza).  A respiratory syncytial virus (RSV) infection. A person who gets this illness may have the following symptoms:  A stuffy or runny nose.  Yellow or green fluid in the nose.  A cough.  Sneezing.  Tiredness (fatigue).  Achy muscles.  A sore throat.  Sweating or chills.  A fever.  A headache. Follow these instructions at home: Managing pain and congestion  Take over-the-counter and prescription medicines only as told by your doctor.  If you have a sore throat, gargle with salt water. Do this 3-4 times per day or as needed. To make a salt-water mixture, dissolve -1 tsp of salt in 1 cup of warm water. Make sure that all the salt dissolves.  Use nose drops made from salt water. This helps with stuffiness (congestion). It also helps soften the skin around your nose.  Drink enough fluid to keep your pee (urine) pale yellow. General instructions   Rest as much as possible.  Do not drink alcohol.  Do not use any products that have nicotine or tobacco, such as cigarettes and e-cigarettes. If you need help quitting, ask your doctor.  Keep all follow-up visits as told by your doctor. This is important. How is this prevented?   Get a flu shot every year. Ask your doctor when you should get your flu shot.  Do not let other people get your germs. If you are sick: ? Stay home from work or school. ? Wash your hands with soap and water often. Wash your hands after you cough or sneeze. If soap and water are not available, use hand sanitizer.  Avoid contact with people who are sick during cold and flu season. This is in fall and winter. Get help if:  Your symptoms last for 10 days or  longer.  Your symptoms get worse over time.  You have a fever.  You have very bad pain in your face or forehead.  Parts of your jaw or neck become very swollen. Get help right away if:  You feel pain or pressure in your chest.  You have shortness of breath.  You faint or feel like you will faint.  You keep throwing up (vomiting).  You feel confused. Summary  A viral respiratory infection is an illness that affects parts of the body that are used for breathing.  Examples of this illness include a cold, the flu, and respiratory syncytial virus (RSV) infection.  The infection can cause a runny nose, cough, sneezing, sore throat, and fever.  Follow what your doctor tells you about taking medicines, drinking lots of fluid, washing your hands, resting at home, and avoiding people who are sick. This information is not intended to replace advice given to you by your health care provider. Make sure you discuss any questions you have with your health care provider. Document Released: 01/24/2008 Document Revised: 02/18/2018 Document Reviewed: 03/23/2017 Elsevier Patient Education  2020 [employer].

## 2018-11-02 NOTE — Progress Notes (Signed)
Virtual Visit via Telephone Note  I connected with Bobby Lamas. on 11/02/18 at 1:00 PM by telephone and verified that I am speaking with the correct person using two identifiers. Bobby Lamas. is currently located at home and nobody is currently with him during this visit. The provider, Loman Brooklyn, FNP is located in their home at time of visit.  I discussed the limitations, risks, security and privacy concerns of performing an evaluation and management service by telephone and the availability of in person appointments. I also discussed with the patient that there may be a patient responsible charge related to this service. The patient expressed understanding and agreed to proceed.  Subjective: PCP: Dettinger, Fransisca Kaufmann, MD  Chief Complaint  Patient presents with  . Nasal Congestion   Patient complains of chest congestion. Additional symptoms include runny nose, sneezing and postnasal drainage. Onset of symptoms was 4 days ago, gradually improving since that time. He is drinking plenty of fluids. Evaluation to date: none. Treatment to date: honey, hot tea, and vinegar. He does not have a history of COPD, asthma, or allergies. He does smoke. Patient has not had recent close contact with someone who has tested positive for COVID-19.    ROS: Per HPI  Current Outpatient Medications:  .  aspirin 81 MG tablet, Take 81 mg by mouth daily.  , Disp: , Rfl:  .  buPROPion (WELLBUTRIN XL) 300 MG 24 hr tablet, Take 1 tablet (300 mg total) by mouth daily., Disp: 90 tablet, Rfl: 3 .  Coenzyme Q10 (CO Q-10) 100 MG CAPS, Take 1 capsule by mouth 2 (two) times daily. , Disp: , Rfl:  .  ezetimibe-simvastatin (VYTORIN) 10-20 MG tablet, Take 1 tablet by mouth daily., Disp: 90 tablet, Rfl: 3 .  glipiZIDE (GLUCOTROL XL) 10 MG 24 hr tablet, Take 1 tablet (10 mg total) by mouth daily., Disp: 90 tablet, Rfl: 3 .  metoprolol tartrate (LOPRESSOR) 25 MG tablet, Take 1 tablet (25 mg total) by mouth 2  (two) times daily., Disp: 180 tablet, Rfl: 3 .  Multiple Vitamins-Minerals (PRESERVISION AREDS 2 PO), Take 1 capsule by mouth 2 (two) times daily., Disp: , Rfl:  .  nitroGLYCERIN (NITROSTAT) 0.4 MG SL tablet, Place 1 tablet (0.4 mg total) under the tongue every 5 (five) minutes as needed., Disp: 25 tablet, Rfl: 6 .  NON FORMULARY, cran-actin 100mg  1 tablet daily, Disp: , Rfl:  .  NON FORMULARY, Sweetish Bitters Daily as needed, Disp: , Rfl:  .  Omega-3 Fatty Acids (FISH OIL) 1000 MG CAPS, Take 1 capsule by mouth 2 (two) times daily.  , Disp: , Rfl:  .  polyethylene glycol powder (GLYCOLAX/MIRALAX) powder, Take 17 g by mouth 2 (two) times daily as needed., Disp: 850 g, Rfl: 3 .  sitaGLIPtin-metformin (JANUMET) 50-500 MG tablet, Take 1 tablet by mouth daily. with food, Disp: 90 tablet, Rfl: 3 .  sodium bicarbonate 650 MG tablet, Take 650 mg by mouth 2 (two) times daily. , Disp: , Rfl: 4  Allergies  Allergen Reactions  . Aspirin     In high doses  . Sulfonamide Derivatives     REACTION: pruitis patient cant remember its been so long    . Nitrofurantoin Rash   Past Medical History:  Diagnosis Date  . Agent orange exposure 1966 or 1971  . Anemia   . Anxiety   . Arthritis    "hands and back" (01/20/2013)  . CAD (coronary artery disease)  native vessel  . Carotid artery disease (HCC)    nonobstructive  . Cataract   . Cecal diverticulitis 2008   drained  . Cellulitis, gluteal    bilateral for the past 6 months/notes 01/20/2013  . Chronic lower back pain   . Chronic renal insufficiency   . Depression   . Diabetes mellitus type II   . Exertional shortness of breath   . HTN (hypertension)   . Hyperlipidemia   . Hypokalemia   . Malnutrition ([place])    protein-calorie  . Myocardial infarction [facility])    "silent; before OHS" (01/20/2013)  . PTSD (post-traumatic stress disorder)   . Spina bifida ([place])   . Urinary incontinence     Observations/Objective: A&O  No respiratory  distress or wheezing audible over the phone Mood, judgement, and thought processes all WNL  Assessment and Plan: 1. Viral respiratory infection - Symptoms improving. Mucinex BID with a full glass of water. Drink plenty of fluids. May continue hot tea, honey, and vinegar. Education provided on viral respiratory infections.    Follow Up Instructions:  I discussed the assessment and treatment plan with the patient. The patient was provided an opportunity to ask questions and all were answered. The patient agreed with the plan and demonstrated an understanding of the instructions.   The patient was advised to call back or seek an in-person evaluation if the symptoms worsen or if the condition fails to improve as anticipated.  The above assessment and management plan was discussed with the patient. The patient verbalized understanding of and has agreed to the management plan. Patient is aware to call the clinic if symptoms persist or worsen. Patient is aware when to return to the clinic for a follow-up visit. Patient educated on when it is appropriate to go to the emergency department.   Time call ended: 1:09 PM  I provided 11 minutes of non-face-to-face time during this encounter.  Hendricks Limes, MSN, APRN, FNP-C [facility] 11/02/18

## 2018-11-03 ENCOUNTER — Encounter: Payer: Self-pay | Admitting: Nurse Practitioner

## 2018-11-03 ENCOUNTER — Ambulatory Visit (INDEPENDENT_AMBULATORY_CARE_PROVIDER_SITE_OTHER): Payer: Medicare Other | Admitting: Nurse Practitioner

## 2018-11-03 DIAGNOSIS — I6523 Occlusion and stenosis of bilateral carotid arteries: Secondary | ICD-10-CM | POA: Diagnosis not present

## 2018-11-03 DIAGNOSIS — R112 Nausea with vomiting, unspecified: Secondary | ICD-10-CM | POA: Diagnosis not present

## 2018-11-03 MED ORDER — ONDANSETRON HCL 4 MG PO TABS: 4.0000 mg | ORAL_TABLET | Freq: Three times a day (TID) | ORAL | 0 refills | Status: DC | PRN

## 2018-11-03 NOTE — Progress Notes (Signed)
   Virtual Visit via telephone Note Due to COVID-19 pandemic this visit was conducted virtually. This visit type was conducted due to national recommendations for restrictions regarding the COVID-19 Pandemic (e.g. social distancing, sheltering in place) in an effort to limit this patient's exposure and mitigate transmission in our community. All issues noted in this document were discussed and addressed.  A physical exam was not performed with this format.  I connected with [name]. on 11/03/18 at 3:20 by telephone and verified that I am speaking with the correct person using two identifiers. [name]. is currently located at home and no one  is currently with him during visit. The provider, Mary-Margaret Hassell Done, FNP is located in their office at time of visit.  I discussed the limitations, risks, security and privacy concerns of performing an evaluation and management service by telephone and the availability of in person appointments. I also discussed with the patient that there may be a patient responsible charge related to this service. The patient expressed understanding and agreed to proceed.   History and Present Illness:  Patient is c/o waking up with diarrhea and vomiting this morning. He has not had anymore episodes since then. His wife says his temp is 99.5. he drank some honey and lemon this morning which helped. His blood sugar will not stay up since this morning. Running between 60-90. He has not eaten much today   Review of Systems  Respiratory: Negative.   Cardiovascular: Negative.   Gastrointestinal: Positive for diarrhea, nausea and vomiting.  Genitourinary: Negative.   Neurological: Negative.   All other systems reviewed and are negative.    Observations/Objective: Alert and oriented- answers all questions appropriately No distress BS 64   Assessment and Plan: [name]. in today with chief complaint of nausea and vomiting  1.  Non-intractable vomiting with nausea, unspecified vomiting type 2. hypoglycemia Drink gatorade Eat some peanut butter crakers and drink a soft drink A( need to add protein to sugar to help keep blood sugar up ) Check blood sugar in a couple of hours and callback if not staying up. Meds ordered this encounter  Medications  . ondansetron (ZOFRAN) 4 MG tablet    Sig: Take 1 tablet (4 mg total) by mouth every 8 (eight) hours as needed for nausea or vomiting.    Dispense:  20 tablet    Refill:  0    Order Specific Question:   Supervising Provider    Answer:   Caryl Pina A [[id number]]    Follow Up Instructions: prn    I discussed the assessment and treatment plan with the patient. The patient was provided an opportunity to ask questions and all were answered. The patient agreed with the plan and demonstrated an understanding of the instructions.   The patient was advised to call back or seek an in-person evaluation if the symptoms worsen or if the condition fails to improve as anticipated.  The above assessment and management plan was discussed with the patient. The patient verbalized understanding of and has agreed to the management plan. Patient is aware to call the clinic if symptoms persist or worsen. Patient is aware when to return to the clinic for a follow-up visit. Patient educated on when it is appropriate to go to the emergency department.   Time call ended:  1:35  I provided 25 minutes of non-face-to-face time during this encounter.    Mary-Margaret Hassell Done, FNP

## 2018-11-04 DIAGNOSIS — R112 Nausea with vomiting, unspecified: Secondary | ICD-10-CM | POA: Diagnosis not present

## 2018-11-04 DIAGNOSIS — R000 Tachycardia, unspecified: Secondary | ICD-10-CM | POA: Diagnosis not present

## 2018-11-04 DIAGNOSIS — E1122 Type 2 diabetes mellitus with diabetic chronic kidney disease: Secondary | ICD-10-CM | POA: Diagnosis present

## 2018-11-04 DIAGNOSIS — E162 Hypoglycemia, unspecified: Secondary | ICD-10-CM | POA: Diagnosis not present

## 2018-11-04 DIAGNOSIS — Z882 Allergy status to sulfonamides status: Secondary | ICD-10-CM | POA: Diagnosis not present

## 2018-11-04 DIAGNOSIS — N179 Acute kidney failure, unspecified: Secondary | ICD-10-CM | POA: Diagnosis not present

## 2018-11-04 DIAGNOSIS — Z7984 Long term (current) use of oral hypoglycemic drugs: Secondary | ICD-10-CM | POA: Diagnosis not present

## 2018-11-04 DIAGNOSIS — Q057 Lumbar spina bifida without hydrocephalus: Secondary | ICD-10-CM | POA: Diagnosis not present

## 2018-11-04 DIAGNOSIS — E11649 Type 2 diabetes mellitus with hypoglycemia without coma: Secondary | ICD-10-CM | POA: Diagnosis present

## 2018-11-04 DIAGNOSIS — R509 Fever, unspecified: Secondary | ICD-10-CM | POA: Diagnosis not present

## 2018-11-04 DIAGNOSIS — N183 Chronic kidney disease, stage 3 (moderate): Secondary | ICD-10-CM | POA: Diagnosis not present

## 2018-11-04 DIAGNOSIS — E1165 Type 2 diabetes mellitus with hyperglycemia: Secondary | ICD-10-CM | POA: Diagnosis not present

## 2018-11-04 DIAGNOSIS — Z993 Dependence on wheelchair: Secondary | ICD-10-CM | POA: Diagnosis not present

## 2018-11-04 DIAGNOSIS — U071 COVID-19: Secondary | ICD-10-CM | POA: Diagnosis not present

## 2018-11-04 DIAGNOSIS — J1289 Other viral pneumonia: Secondary | ICD-10-CM | POA: Diagnosis not present

## 2018-11-04 DIAGNOSIS — R0902 Hypoxemia: Secondary | ICD-10-CM | POA: Diagnosis not present

## 2018-11-04 DIAGNOSIS — R0689 Other abnormalities of breathing: Secondary | ICD-10-CM | POA: Diagnosis not present

## 2018-11-04 DIAGNOSIS — I252 Old myocardial infarction: Secondary | ICD-10-CM | POA: Diagnosis not present

## 2018-11-04 DIAGNOSIS — R197 Diarrhea, unspecified: Secondary | ICD-10-CM | POA: Diagnosis not present

## 2018-11-04 DIAGNOSIS — Z7982 Long term (current) use of aspirin: Secondary | ICD-10-CM | POA: Diagnosis not present

## 2018-11-04 DIAGNOSIS — N189 Chronic kidney disease, unspecified: Secondary | ICD-10-CM | POA: Diagnosis not present

## 2018-11-04 DIAGNOSIS — E161 Other hypoglycemia: Secondary | ICD-10-CM | POA: Diagnosis not present

## 2018-11-04 DIAGNOSIS — I129 Hypertensive chronic kidney disease with stage 1 through stage 4 chronic kidney disease, or unspecified chronic kidney disease: Secondary | ICD-10-CM | POA: Diagnosis not present

## 2018-11-04 DIAGNOSIS — J9601 Acute respiratory failure with hypoxia: Secondary | ICD-10-CM | POA: Diagnosis not present

## 2018-11-05 DIAGNOSIS — R509 Fever, unspecified: Secondary | ICD-10-CM | POA: Diagnosis not present

## 2018-11-11 DIAGNOSIS — N189 Chronic kidney disease, unspecified: Secondary | ICD-10-CM | POA: Diagnosis not present

## 2018-11-11 DIAGNOSIS — U071 COVID-19: Secondary | ICD-10-CM | POA: Insufficient documentation

## 2018-11-11 DIAGNOSIS — I129 Hypertensive chronic kidney disease with stage 1 through stage 4 chronic kidney disease, or unspecified chronic kidney disease: Secondary | ICD-10-CM | POA: Diagnosis present

## 2018-11-11 DIAGNOSIS — Z452 Encounter for adjustment and management of vascular access device: Secondary | ICD-10-CM | POA: Diagnosis not present

## 2018-11-11 DIAGNOSIS — I252 Old myocardial infarction: Secondary | ICD-10-CM | POA: Diagnosis not present

## 2018-11-11 DIAGNOSIS — Z794 Long term (current) use of insulin: Secondary | ICD-10-CM | POA: Diagnosis not present

## 2018-11-11 DIAGNOSIS — G9341 Metabolic encephalopathy: Secondary | ICD-10-CM | POA: Diagnosis present

## 2018-11-11 DIAGNOSIS — F4310 Post-traumatic stress disorder, unspecified: Secondary | ICD-10-CM | POA: Diagnosis present

## 2018-11-11 DIAGNOSIS — E11649 Type 2 diabetes mellitus with hypoglycemia without coma: Secondary | ICD-10-CM | POA: Diagnosis present

## 2018-11-11 DIAGNOSIS — N183 Chronic kidney disease, stage 3 unspecified: Secondary | ICD-10-CM | POA: Diagnosis not present

## 2018-11-11 DIAGNOSIS — E871 Hypo-osmolality and hyponatremia: Secondary | ICD-10-CM | POA: Diagnosis not present

## 2018-11-11 DIAGNOSIS — Z4682 Encounter for fitting and adjustment of non-vascular catheter: Secondary | ICD-10-CM | POA: Diagnosis not present

## 2018-11-11 DIAGNOSIS — I959 Hypotension, unspecified: Secondary | ICD-10-CM | POA: Diagnosis not present

## 2018-11-11 DIAGNOSIS — L89313 Pressure ulcer of right buttock, stage 3: Secondary | ICD-10-CM | POA: Diagnosis present

## 2018-11-11 DIAGNOSIS — T4145XA Adverse effect of unspecified anesthetic, initial encounter: Secondary | ICD-10-CM | POA: Diagnosis not present

## 2018-11-11 DIAGNOSIS — J1289 Other viral pneumonia: Secondary | ICD-10-CM | POA: Diagnosis not present

## 2018-11-11 DIAGNOSIS — R0902 Hypoxemia: Secondary | ICD-10-CM | POA: Diagnosis not present

## 2018-11-11 DIAGNOSIS — R0603 Acute respiratory distress: Secondary | ICD-10-CM | POA: Diagnosis not present

## 2018-11-11 DIAGNOSIS — E119 Type 2 diabetes mellitus without complications: Secondary | ICD-10-CM | POA: Diagnosis not present

## 2018-11-11 DIAGNOSIS — F419 Anxiety disorder, unspecified: Secondary | ICD-10-CM | POA: Diagnosis present

## 2018-11-11 DIAGNOSIS — I25810 Atherosclerosis of coronary artery bypass graft(s) without angina pectoris: Secondary | ICD-10-CM | POA: Diagnosis not present

## 2018-11-11 DIAGNOSIS — N179 Acute kidney failure, unspecified: Secondary | ICD-10-CM | POA: Diagnosis not present

## 2018-11-11 DIAGNOSIS — Q059 Spina bifida, unspecified: Secondary | ICD-10-CM | POA: Diagnosis not present

## 2018-11-11 DIAGNOSIS — F329 Major depressive disorder, single episode, unspecified: Secondary | ICD-10-CM | POA: Diagnosis present

## 2018-11-11 DIAGNOSIS — I952 Hypotension due to drugs: Secondary | ICD-10-CM | POA: Diagnosis not present

## 2018-11-11 DIAGNOSIS — E1122 Type 2 diabetes mellitus with diabetic chronic kidney disease: Secondary | ICD-10-CM | POA: Diagnosis not present

## 2018-11-11 DIAGNOSIS — H3530 Unspecified macular degeneration: Secondary | ICD-10-CM | POA: Diagnosis present

## 2018-11-11 DIAGNOSIS — Z9189 Other specified personal risk factors, not elsewhere classified: Secondary | ICD-10-CM | POA: Diagnosis not present

## 2018-11-11 DIAGNOSIS — R918 Other nonspecific abnormal finding of lung field: Secondary | ICD-10-CM | POA: Diagnosis not present

## 2018-11-11 DIAGNOSIS — E861 Hypovolemia: Secondary | ICD-10-CM | POA: Diagnosis present

## 2018-11-11 DIAGNOSIS — J181 Lobar pneumonia, unspecified organism: Secondary | ICD-10-CM | POA: Diagnosis not present

## 2018-11-11 DIAGNOSIS — Z951 Presence of aortocoronary bypass graft: Secondary | ICD-10-CM | POA: Diagnosis not present

## 2018-11-11 DIAGNOSIS — I10 Essential (primary) hypertension: Secondary | ICD-10-CM | POA: Diagnosis not present

## 2018-11-11 DIAGNOSIS — E1165 Type 2 diabetes mellitus with hyperglycemia: Secondary | ICD-10-CM | POA: Diagnosis not present

## 2018-11-11 DIAGNOSIS — J80 Acute respiratory distress syndrome: Secondary | ICD-10-CM | POA: Diagnosis not present

## 2018-11-11 DIAGNOSIS — Q057 Lumbar spina bifida without hydrocephalus: Secondary | ICD-10-CM | POA: Diagnosis not present

## 2018-11-11 DIAGNOSIS — R58 Hemorrhage, not elsewhere classified: Secondary | ICD-10-CM | POA: Diagnosis not present

## 2018-11-11 DIAGNOSIS — Z993 Dependence on wheelchair: Secondary | ICD-10-CM | POA: Diagnosis not present

## 2018-11-11 DIAGNOSIS — J90 Pleural effusion, not elsewhere classified: Secondary | ICD-10-CM | POA: Diagnosis not present

## 2018-11-11 DIAGNOSIS — Z9989 Dependence on other enabling machines and devices: Secondary | ICD-10-CM | POA: Diagnosis not present

## 2018-11-11 DIAGNOSIS — R633 Feeding difficulties: Secondary | ICD-10-CM | POA: Diagnosis not present

## 2018-11-11 DIAGNOSIS — J9601 Acute respiratory failure with hypoxia: Secondary | ICD-10-CM | POA: Diagnosis not present

## 2018-11-11 DIAGNOSIS — R000 Tachycardia, unspecified: Secondary | ICD-10-CM | POA: Diagnosis not present

## 2018-11-11 DIAGNOSIS — K219 Gastro-esophageal reflux disease without esophagitis: Secondary | ICD-10-CM | POA: Diagnosis present

## 2018-11-11 DIAGNOSIS — J9600 Acute respiratory failure, unspecified whether with hypoxia or hypercapnia: Secondary | ICD-10-CM | POA: Diagnosis not present

## 2018-11-11 DIAGNOSIS — I2510 Atherosclerotic heart disease of native coronary artery without angina pectoris: Secondary | ICD-10-CM | POA: Diagnosis not present

## 2018-11-11 DIAGNOSIS — D62 Acute posthemorrhagic anemia: Secondary | ICD-10-CM | POA: Diagnosis not present

## 2018-11-11 DIAGNOSIS — Z9911 Dependence on respirator [ventilator] status: Secondary | ICD-10-CM | POA: Diagnosis not present

## 2018-11-11 DIAGNOSIS — E785 Hyperlipidemia, unspecified: Secondary | ICD-10-CM | POA: Diagnosis not present

## 2018-11-11 DIAGNOSIS — Z9981 Dependence on supplemental oxygen: Secondary | ICD-10-CM | POA: Diagnosis not present

## 2018-11-12 MED ORDER — LOSARTAN POTASSIUM (ANGIOTENSIN II RECEPTOR ANTAGONISTS)
20.00 | Status: DC
Start: 2018-11-12 — End: 2018-11-12

## 2018-11-12 MED ORDER — EQL SHEER SPOTS SMALL MISC
0.00 | Status: DC
Start: ? — End: 2018-11-12

## 2018-11-12 MED ORDER — ANTI-OXIDANT TAKE ONE PO
7500.00 | ORAL | Status: DC
Start: 2018-11-12 — End: 2018-11-12

## 2018-11-12 MED ORDER — Medication
100.00 | Status: DC
Start: ? — End: 2018-11-12

## 2018-11-18 DIAGNOSIS — R58 Hemorrhage, not elsewhere classified: Secondary | ICD-10-CM | POA: Insufficient documentation

## 2018-11-18 DIAGNOSIS — E11649 Type 2 diabetes mellitus with hypoglycemia without coma: Secondary | ICD-10-CM | POA: Insufficient documentation

## 2018-11-19 DIAGNOSIS — J80 Acute respiratory distress syndrome: Secondary | ICD-10-CM | POA: Diagnosis not present

## 2018-11-19 DIAGNOSIS — Z794 Long term (current) use of insulin: Secondary | ICD-10-CM | POA: Diagnosis not present

## 2018-11-19 DIAGNOSIS — E1165 Type 2 diabetes mellitus with hyperglycemia: Secondary | ICD-10-CM | POA: Diagnosis not present

## 2018-11-19 DIAGNOSIS — U071 COVID-19: Secondary | ICD-10-CM | POA: Diagnosis not present

## 2018-11-25 DIAGNOSIS — L89159 Pressure ulcer of sacral region, unspecified stage: Secondary | ICD-10-CM | POA: Insufficient documentation

## 2018-11-26 ENCOUNTER — Telehealth: Payer: Self-pay | Admitting: Family Medicine

## 2018-11-26 NOTE — Telephone Encounter (Signed)
Appointment scheduled for tele-visit on 12/07/2018.  Records requested.

## 2018-11-27 DIAGNOSIS — Q059 Spina bifida, unspecified: Secondary | ICD-10-CM | POA: Diagnosis not present

## 2018-11-27 DIAGNOSIS — I2510 Atherosclerotic heart disease of native coronary artery without angina pectoris: Secondary | ICD-10-CM | POA: Diagnosis not present

## 2018-11-27 DIAGNOSIS — L89153 Pressure ulcer of sacral region, stage 3: Secondary | ICD-10-CM | POA: Diagnosis not present

## 2018-11-27 DIAGNOSIS — H3530 Unspecified macular degeneration: Secondary | ICD-10-CM | POA: Diagnosis not present

## 2018-11-27 DIAGNOSIS — F329 Major depressive disorder, single episode, unspecified: Secondary | ICD-10-CM | POA: Diagnosis not present

## 2018-11-27 DIAGNOSIS — I5032 Chronic diastolic (congestive) heart failure: Secondary | ICD-10-CM | POA: Diagnosis not present

## 2018-11-27 DIAGNOSIS — K5900 Constipation, unspecified: Secondary | ICD-10-CM | POA: Diagnosis not present

## 2018-11-27 DIAGNOSIS — K5909 Other constipation: Secondary | ICD-10-CM | POA: Diagnosis not present

## 2018-11-27 DIAGNOSIS — Z87891 Personal history of nicotine dependence: Secondary | ICD-10-CM | POA: Diagnosis not present

## 2018-11-27 DIAGNOSIS — H409 Unspecified glaucoma: Secondary | ICD-10-CM | POA: Diagnosis not present

## 2018-11-27 DIAGNOSIS — F4312 Post-traumatic stress disorder, chronic: Secondary | ICD-10-CM | POA: Diagnosis not present

## 2018-11-27 DIAGNOSIS — Z951 Presence of aortocoronary bypass graft: Secondary | ICD-10-CM | POA: Diagnosis not present

## 2018-11-27 DIAGNOSIS — I252 Old myocardial infarction: Secondary | ICD-10-CM | POA: Diagnosis not present

## 2018-11-27 DIAGNOSIS — Z6830 Body mass index (BMI) 30.0-30.9, adult: Secondary | ICD-10-CM | POA: Diagnosis not present

## 2018-11-27 DIAGNOSIS — I130 Hypertensive heart and chronic kidney disease with heart failure and stage 1 through stage 4 chronic kidney disease, or unspecified chronic kidney disease: Secondary | ICD-10-CM | POA: Diagnosis not present

## 2018-11-27 DIAGNOSIS — K219 Gastro-esophageal reflux disease without esophagitis: Secondary | ICD-10-CM | POA: Diagnosis not present

## 2018-11-27 DIAGNOSIS — N182 Chronic kidney disease, stage 2 (mild): Secondary | ICD-10-CM | POA: Diagnosis not present

## 2018-11-27 DIAGNOSIS — F419 Anxiety disorder, unspecified: Secondary | ICD-10-CM | POA: Diagnosis not present

## 2018-11-27 DIAGNOSIS — U071 COVID-19: Secondary | ICD-10-CM | POA: Diagnosis not present

## 2018-11-27 DIAGNOSIS — Z7901 Long term (current) use of anticoagulants: Secondary | ICD-10-CM | POA: Diagnosis not present

## 2018-11-27 DIAGNOSIS — J80 Acute respiratory distress syndrome: Secondary | ICD-10-CM | POA: Diagnosis not present

## 2018-11-27 DIAGNOSIS — E1122 Type 2 diabetes mellitus with diabetic chronic kidney disease: Secondary | ICD-10-CM | POA: Diagnosis not present

## 2018-11-27 DIAGNOSIS — Z9981 Dependence on supplemental oxygen: Secondary | ICD-10-CM | POA: Diagnosis not present

## 2018-11-27 DIAGNOSIS — E785 Hyperlipidemia, unspecified: Secondary | ICD-10-CM | POA: Diagnosis not present

## 2018-11-27 DIAGNOSIS — Z7984 Long term (current) use of oral hypoglycemic drugs: Secondary | ICD-10-CM | POA: Diagnosis not present

## 2018-12-01 DIAGNOSIS — I2510 Atherosclerotic heart disease of native coronary artery without angina pectoris: Secondary | ICD-10-CM | POA: Diagnosis not present

## 2018-12-01 DIAGNOSIS — U071 COVID-19: Secondary | ICD-10-CM | POA: Diagnosis not present

## 2018-12-01 DIAGNOSIS — Q059 Spina bifida, unspecified: Secondary | ICD-10-CM | POA: Diagnosis not present

## 2018-12-01 DIAGNOSIS — I252 Old myocardial infarction: Secondary | ICD-10-CM | POA: Diagnosis not present

## 2018-12-01 DIAGNOSIS — L89153 Pressure ulcer of sacral region, stage 3: Secondary | ICD-10-CM | POA: Diagnosis not present

## 2018-12-01 DIAGNOSIS — J80 Acute respiratory distress syndrome: Secondary | ICD-10-CM | POA: Diagnosis not present

## 2018-12-02 DIAGNOSIS — L89153 Pressure ulcer of sacral region, stage 3: Secondary | ICD-10-CM | POA: Diagnosis not present

## 2018-12-02 DIAGNOSIS — J80 Acute respiratory distress syndrome: Secondary | ICD-10-CM | POA: Diagnosis not present

## 2018-12-02 DIAGNOSIS — I252 Old myocardial infarction: Secondary | ICD-10-CM | POA: Diagnosis not present

## 2018-12-02 DIAGNOSIS — I2510 Atherosclerotic heart disease of native coronary artery without angina pectoris: Secondary | ICD-10-CM | POA: Diagnosis not present

## 2018-12-02 DIAGNOSIS — U071 COVID-19: Secondary | ICD-10-CM | POA: Diagnosis not present

## 2018-12-02 DIAGNOSIS — Q059 Spina bifida, unspecified: Secondary | ICD-10-CM | POA: Diagnosis not present

## 2018-12-03 DIAGNOSIS — I252 Old myocardial infarction: Secondary | ICD-10-CM | POA: Diagnosis not present

## 2018-12-03 DIAGNOSIS — L89153 Pressure ulcer of sacral region, stage 3: Secondary | ICD-10-CM | POA: Diagnosis not present

## 2018-12-03 DIAGNOSIS — U071 COVID-19: Secondary | ICD-10-CM | POA: Diagnosis not present

## 2018-12-03 DIAGNOSIS — J80 Acute respiratory distress syndrome: Secondary | ICD-10-CM | POA: Diagnosis not present

## 2018-12-03 DIAGNOSIS — I2510 Atherosclerotic heart disease of native coronary artery without angina pectoris: Secondary | ICD-10-CM | POA: Diagnosis not present

## 2018-12-03 DIAGNOSIS — Q059 Spina bifida, unspecified: Secondary | ICD-10-CM | POA: Diagnosis not present

## 2018-12-06 DIAGNOSIS — L89153 Pressure ulcer of sacral region, stage 3: Secondary | ICD-10-CM | POA: Diagnosis not present

## 2018-12-06 DIAGNOSIS — Q059 Spina bifida, unspecified: Secondary | ICD-10-CM | POA: Diagnosis not present

## 2018-12-06 DIAGNOSIS — I2510 Atherosclerotic heart disease of native coronary artery without angina pectoris: Secondary | ICD-10-CM | POA: Diagnosis not present

## 2018-12-06 DIAGNOSIS — U071 COVID-19: Secondary | ICD-10-CM | POA: Diagnosis not present

## 2018-12-06 DIAGNOSIS — J80 Acute respiratory distress syndrome: Secondary | ICD-10-CM | POA: Diagnosis not present

## 2018-12-06 DIAGNOSIS — I252 Old myocardial infarction: Secondary | ICD-10-CM | POA: Diagnosis not present

## 2018-12-07 ENCOUNTER — Ambulatory Visit (INDEPENDENT_AMBULATORY_CARE_PROVIDER_SITE_OTHER): Payer: Medicare Other | Admitting: Family Medicine

## 2018-12-07 ENCOUNTER — Encounter: Payer: Self-pay | Admitting: Family Medicine

## 2018-12-07 DIAGNOSIS — U071 COVID-19: Secondary | ICD-10-CM | POA: Diagnosis not present

## 2018-12-07 DIAGNOSIS — J1282 Pneumonia due to coronavirus disease 2019: Secondary | ICD-10-CM

## 2018-12-07 DIAGNOSIS — J329 Chronic sinusitis, unspecified: Secondary | ICD-10-CM | POA: Diagnosis not present

## 2018-12-07 DIAGNOSIS — J1289 Other viral pneumonia: Secondary | ICD-10-CM

## 2018-12-07 DIAGNOSIS — Q059 Spina bifida, unspecified: Secondary | ICD-10-CM | POA: Diagnosis not present

## 2018-12-07 DIAGNOSIS — I6523 Occlusion and stenosis of bilateral carotid arteries: Secondary | ICD-10-CM

## 2018-12-07 DIAGNOSIS — I252 Old myocardial infarction: Secondary | ICD-10-CM | POA: Diagnosis not present

## 2018-12-07 DIAGNOSIS — J80 Acute respiratory distress syndrome: Secondary | ICD-10-CM | POA: Diagnosis not present

## 2018-12-07 DIAGNOSIS — I2510 Atherosclerotic heart disease of native coronary artery without angina pectoris: Secondary | ICD-10-CM | POA: Diagnosis not present

## 2018-12-07 DIAGNOSIS — L89153 Pressure ulcer of sacral region, stage 3: Secondary | ICD-10-CM | POA: Diagnosis not present

## 2018-12-07 MED ORDER — SALINE SPRAY 0.65 % NA SOLN: 1.0000 | NASAL | 3 refills | Status: DC | PRN

## 2018-12-07 NOTE — Progress Notes (Signed)
Virtual Visit via telephone Note  I connected with [name]. on 12/07/18 at 1327 by telephone and verified that I am speaking with the correct person using two identifiers. [name]. is currently located at home and no other people are currently with her during visit. The provider, Fransisca Kaufmann Dettinger, MD is located in their office at time of visit.  Call ended at 1350  I discussed the limitations, risks, security and privacy concerns of performing an evaluation and management service by telephone and the availability of in person appointments. I also discussed with the patient that there may be a patient responsible charge related to this service. The patient expressed understanding and agreed to proceed.   History and Present Illness: Patient stopped glipizide because blood sugar was below 120.  Patient was in the hospital at [facility] and was transferred to [facility] [place].  He was admitted on the 12th of November and d/c'd on the 1st of October. He was intubated for 5 days and was extubated on 9/19 and is still on 3Lnc and is still using it daily.  He gets irritation and occasional cough.  He denies any shortness of breath.  He has OT and PT and RT coming to the house working on his strength and conditioning. He was sent home with anticoagulation for 30 days.  He still has postnasal drainage. Patient denies fevers or chills or myalgias. His energy is coming back and appetite is coming back as well.  Patient is so weak right now and deconditioned because of the hospital stay the he can barely sit up and they are working with physical therapy to get his strength back up.  He has a pressure sore on his back side and wound care is working on it at home  No diagnosis found.  Outpatient Encounter Medications as of 12/07/2018  Medication Sig  . aspirin 81 MG tablet Take 81 mg by mouth daily.    Marland Kitchen buPROPion (WELLBUTRIN XL) 300 MG 24 hr tablet Take 1 tablet (300 mg total)  by mouth daily.  . Coenzyme Q10 (CO Q-10) 100 MG CAPS Take 1 capsule by mouth 2 (two) times daily.   Marland Kitchen ezetimibe-simvastatin (VYTORIN) 10-20 MG tablet Take 1 tablet by mouth daily.  Marland Kitchen glipiZIDE (GLUCOTROL XL) 10 MG 24 hr tablet Take 1 tablet (10 mg total) by mouth daily.  Marland Kitchen guaiFENesin (MUCINEX) 600 MG 12 hr tablet Take 1 tablet (600 mg total) by mouth 2 (two) times daily.  . metoprolol tartrate (LOPRESSOR) 25 MG tablet Take 1 tablet (25 mg total) by mouth 2 (two) times daily.  . Multiple Vitamins-Minerals (PRESERVISION AREDS 2 PO) Take 1 capsule by mouth 2 (two) times daily.  . nitroGLYCERIN (NITROSTAT) 0.4 MG SL tablet Place 1 tablet (0.4 mg total) under the tongue every 5 (five) minutes as needed.  . NON FORMULARY cran-actin 100mg  1 tablet daily  . NON FORMULARY Sweetish Bitters Daily as needed  . Omega-3 Fatty Acids (FISH OIL) 1000 MG CAPS Take 1 capsule by mouth 2 (two) times daily.    . ondansetron (ZOFRAN) 4 MG tablet Take 1 tablet (4 mg total) by mouth every 8 (eight) hours as needed for nausea or vomiting.  . polyethylene glycol powder (GLYCOLAX/MIRALAX) powder Take 17 g by mouth 2 (two) times daily as needed.  . sitaGLIPtin-metformin (JANUMET) 50-500 MG tablet Take 1 tablet by mouth daily. with food  . sodium bicarbonate 650 MG tablet Take 650 mg by mouth  2 (two) times daily.    No facility-administered encounter medications on file as of 12/07/2018.     Review of Systems  Constitutional: Negative for chills and fever.  HENT: Positive for postnasal drip. Negative for congestion, ear discharge, ear pain, rhinorrhea, sinus pressure, sneezing, sore throat and voice change.   Eyes: Negative for pain, discharge, redness and visual disturbance.  Respiratory: Positive for cough. Negative for shortness of breath and wheezing.   Cardiovascular: Negative for chest pain and leg swelling.  Skin: Negative for color change.  All other systems reviewed and are negative.    Observations/Objective: Patient sounds comfortable and in no acute distress  Assessment and Plan: Problem List Items Addressed This Visit    None    Visit Diagnoses    Pneumonia due to COVID-19 virus    -  Primary   Relevant Medications   sodium chloride (OCEAN) 0.65 % SOLN nasal spray   Purulent postnasal drainage           Follow Up Instructions: I would like him to follow-up as soon as he is able to for a chest x-ray and some blood work in the office but right now he says there is no way he can make it as his strength is still down, he has physical therapy and OT coming to the house and trying to get his strength back because he is barely even able to sit up at this point    I discussed the assessment and treatment plan with the patient. The patient was provided an opportunity to ask questions and all were answered. The patient agreed with the plan and demonstrated an understanding of the instructions.   The patient was advised to call back or seek an in-person evaluation if the symptoms worsen or if the condition fails to improve as anticipated.  The above assessment and management plan was discussed with the patient. The patient verbalized understanding of and has agreed to the management plan. Patient is aware to call the clinic if symptoms persist or worsen. Patient is aware when to return to the clinic for a follow-up visit. Patient educated on when it is appropriate to go to the emergency department.    I provided 23 minutes of non-face-to-face time during this encounter.    Worthy Rancher, MD

## 2018-12-08 DIAGNOSIS — I252 Old myocardial infarction: Secondary | ICD-10-CM | POA: Diagnosis not present

## 2018-12-08 DIAGNOSIS — I2510 Atherosclerotic heart disease of native coronary artery without angina pectoris: Secondary | ICD-10-CM | POA: Diagnosis not present

## 2018-12-08 DIAGNOSIS — J80 Acute respiratory distress syndrome: Secondary | ICD-10-CM | POA: Diagnosis not present

## 2018-12-08 DIAGNOSIS — L89153 Pressure ulcer of sacral region, stage 3: Secondary | ICD-10-CM | POA: Diagnosis not present

## 2018-12-08 DIAGNOSIS — U071 COVID-19: Secondary | ICD-10-CM | POA: Diagnosis not present

## 2018-12-08 DIAGNOSIS — Q059 Spina bifida, unspecified: Secondary | ICD-10-CM | POA: Diagnosis not present

## 2018-12-09 DIAGNOSIS — I252 Old myocardial infarction: Secondary | ICD-10-CM | POA: Diagnosis not present

## 2018-12-09 DIAGNOSIS — Q059 Spina bifida, unspecified: Secondary | ICD-10-CM | POA: Diagnosis not present

## 2018-12-09 DIAGNOSIS — J80 Acute respiratory distress syndrome: Secondary | ICD-10-CM | POA: Diagnosis not present

## 2018-12-09 DIAGNOSIS — L89153 Pressure ulcer of sacral region, stage 3: Secondary | ICD-10-CM | POA: Diagnosis not present

## 2018-12-09 DIAGNOSIS — U071 COVID-19: Secondary | ICD-10-CM | POA: Diagnosis not present

## 2018-12-09 DIAGNOSIS — I2510 Atherosclerotic heart disease of native coronary artery without angina pectoris: Secondary | ICD-10-CM | POA: Diagnosis not present

## 2018-12-10 DIAGNOSIS — I252 Old myocardial infarction: Secondary | ICD-10-CM | POA: Diagnosis not present

## 2018-12-10 DIAGNOSIS — L89153 Pressure ulcer of sacral region, stage 3: Secondary | ICD-10-CM | POA: Diagnosis not present

## 2018-12-10 DIAGNOSIS — J80 Acute respiratory distress syndrome: Secondary | ICD-10-CM | POA: Diagnosis not present

## 2018-12-10 DIAGNOSIS — Q059 Spina bifida, unspecified: Secondary | ICD-10-CM | POA: Diagnosis not present

## 2018-12-10 DIAGNOSIS — U071 COVID-19: Secondary | ICD-10-CM | POA: Diagnosis not present

## 2018-12-10 DIAGNOSIS — I2510 Atherosclerotic heart disease of native coronary artery without angina pectoris: Secondary | ICD-10-CM | POA: Diagnosis not present

## 2018-12-13 DIAGNOSIS — U071 COVID-19: Secondary | ICD-10-CM | POA: Diagnosis not present

## 2018-12-13 DIAGNOSIS — J80 Acute respiratory distress syndrome: Secondary | ICD-10-CM | POA: Diagnosis not present

## 2018-12-13 DIAGNOSIS — L89153 Pressure ulcer of sacral region, stage 3: Secondary | ICD-10-CM | POA: Diagnosis not present

## 2018-12-13 DIAGNOSIS — I252 Old myocardial infarction: Secondary | ICD-10-CM | POA: Diagnosis not present

## 2018-12-13 DIAGNOSIS — I2510 Atherosclerotic heart disease of native coronary artery without angina pectoris: Secondary | ICD-10-CM | POA: Diagnosis not present

## 2018-12-13 DIAGNOSIS — Q059 Spina bifida, unspecified: Secondary | ICD-10-CM | POA: Diagnosis not present

## 2018-12-14 DIAGNOSIS — Q059 Spina bifida, unspecified: Secondary | ICD-10-CM | POA: Diagnosis not present

## 2018-12-14 DIAGNOSIS — U071 COVID-19: Secondary | ICD-10-CM | POA: Diagnosis not present

## 2018-12-14 DIAGNOSIS — I252 Old myocardial infarction: Secondary | ICD-10-CM | POA: Diagnosis not present

## 2018-12-14 DIAGNOSIS — I2510 Atherosclerotic heart disease of native coronary artery without angina pectoris: Secondary | ICD-10-CM | POA: Diagnosis not present

## 2018-12-14 DIAGNOSIS — J80 Acute respiratory distress syndrome: Secondary | ICD-10-CM | POA: Diagnosis not present

## 2018-12-14 DIAGNOSIS — L89153 Pressure ulcer of sacral region, stage 3: Secondary | ICD-10-CM | POA: Diagnosis not present

## 2018-12-15 ENCOUNTER — Encounter: Payer: Self-pay | Admitting: Family Medicine

## 2018-12-15 ENCOUNTER — Ambulatory Visit (INDEPENDENT_AMBULATORY_CARE_PROVIDER_SITE_OTHER): Payer: Medicare Other | Admitting: Family Medicine

## 2018-12-15 DIAGNOSIS — U071 COVID-19: Secondary | ICD-10-CM | POA: Diagnosis not present

## 2018-12-15 DIAGNOSIS — L89153 Pressure ulcer of sacral region, stage 3: Secondary | ICD-10-CM | POA: Diagnosis not present

## 2018-12-15 DIAGNOSIS — I252 Old myocardial infarction: Secondary | ICD-10-CM | POA: Diagnosis not present

## 2018-12-15 DIAGNOSIS — Q059 Spina bifida, unspecified: Secondary | ICD-10-CM | POA: Diagnosis not present

## 2018-12-15 DIAGNOSIS — I10 Essential (primary) hypertension: Secondary | ICD-10-CM | POA: Diagnosis not present

## 2018-12-15 DIAGNOSIS — J80 Acute respiratory distress syndrome: Secondary | ICD-10-CM | POA: Diagnosis not present

## 2018-12-15 DIAGNOSIS — I6523 Occlusion and stenosis of bilateral carotid arteries: Secondary | ICD-10-CM | POA: Diagnosis not present

## 2018-12-15 DIAGNOSIS — I2510 Atherosclerotic heart disease of native coronary artery without angina pectoris: Secondary | ICD-10-CM | POA: Diagnosis not present

## 2018-12-15 MED ORDER — METOPROLOL TARTRATE 25 MG PO TABS: 12.5000 mg | ORAL_TABLET | Freq: Two times a day (BID) | ORAL | 3 refills | Status: DC

## 2018-12-15 NOTE — Progress Notes (Signed)
Virtual Visit via telephone Note  I connected with [name]. on 12/15/18 at 1138 by telephone and verified that I am speaking with the correct person using two identifiers. [name]. is currently located at home and son are currently with her during visit. The provider, Fransisca Kaufmann Dettinger, MD is located in their office at time of visit.  Call ended at 1149  I discussed the limitations, risks, security and privacy concerns of performing an evaluation and management service by telephone and the availability of in person appointments. I also discussed with the patient that there may be a patient responsible charge related to this service. The patient expressed understanding and agreed to proceed.   History and Present Illness: Hypertension Patient is currently on metoprolol, and their blood pressure today is 130/38, 106/48, hr 72, on old machine and 124/60 and 119/59 and 112/59 on new machine. Patient denies any lightheadedness or dizziness. Patient denies headaches, blurred vision, chest pains, shortness of breath, or weakness. Denies any side effects from medication and is content with current medication. Patient has been having low blood pressures but they are thinking it is the machine, they got a new .   He denies any lightheadedness or dizziness, PT is working with him   No diagnosis found.  Outpatient Encounter Medications as of 12/15/2018  Medication Sig  . aspirin 81 MG tablet Take 81 mg by mouth daily.    Marland Kitchen buPROPion (WELLBUTRIN XL) 300 MG 24 hr tablet Take 1 tablet (300 mg total) by mouth daily.  . Coenzyme Q10 (CO Q-10) 100 MG CAPS Take 1 capsule by mouth 2 (two) times daily.   Marland Kitchen ezetimibe-simvastatin (VYTORIN) 10-20 MG tablet Take 1 tablet by mouth daily.  Marland Kitchen guaiFENesin (MUCINEX) 600 MG 12 hr tablet Take 1 tablet (600 mg total) by mouth 2 (two) times daily.  . metoprolol tartrate (LOPRESSOR) 25 MG tablet Take 1 tablet (25 mg total) by mouth 2 (two) times daily.   . Multiple Vitamins-Minerals (PRESERVISION AREDS 2 PO) Take 1 capsule by mouth 2 (two) times daily.  . nitroGLYCERIN (NITROSTAT) 0.4 MG SL tablet Place 1 tablet (0.4 mg total) under the tongue every 5 (five) minutes as needed.  . NON FORMULARY cran-actin 100mg  1 tablet daily  . NON FORMULARY Sweetish Bitters Daily as needed  . Omega-3 Fatty Acids (FISH OIL) 1000 MG CAPS Take 1 capsule by mouth 2 (two) times daily.    . polyethylene glycol powder (GLYCOLAX/MIRALAX) powder Take 17 g by mouth 2 (two) times daily as needed.  . sitaGLIPtin-metformin (JANUMET) 50-500 MG tablet Take 1 tablet by mouth daily. with food  . sodium bicarbonate 650 MG tablet Take 650 mg by mouth 2 (two) times daily.   . sodium chloride (OCEAN) 0.65 % SOLN nasal spray Place 1 spray into both nostrils as needed for congestion.   No facility-administered encounter medications on file as of 12/15/2018.     Review of Systems  Constitutional: Negative for chills and fever.  Respiratory: Negative for shortness of breath and wheezing.   Cardiovascular: Negative for chest pain and leg swelling.  Musculoskeletal: Negative for back pain and gait problem.  Skin: Negative for rash.  Neurological: Negative for dizziness, weakness and numbness.  All other systems reviewed and are negative.   Observations/Objective: Patient sounds comfortable and in no acute distress  Assessment and Plan: Problem List Items Addressed This Visit      Cardiovascular and Mediastinum   Essential hypertension - Primary  Relevant Medications   metoprolol tartrate (LOPRESSOR) 25 MG tablet      Cut metoprolol in half and monitor blood pressures Follow Up Instructions: Follow up in 1-2 months for BP    I discussed the assessment and treatment plan with the patient. The patient was provided an opportunity to ask questions and all were answered. The patient agreed with the plan and demonstrated an understanding of the instructions.   The  patient was advised to call back or seek an in-person evaluation if the symptoms worsen or if the condition fails to improve as anticipated.  The above assessment and management plan was discussed with the patient. The patient verbalized understanding of and has agreed to the management plan. Patient is aware to call the clinic if symptoms persist or worsen. Patient is aware when to return to the clinic for a follow-up visit. Patient educated on when it is appropriate to go to the emergency department.    I provided 9 minutes of non-face-to-face time during this encounter.    Worthy Rancher, MD

## 2018-12-16 ENCOUNTER — Ambulatory Visit (INDEPENDENT_AMBULATORY_CARE_PROVIDER_SITE_OTHER): Payer: Medicare Other

## 2018-12-16 ENCOUNTER — Other Ambulatory Visit: Payer: Self-pay

## 2018-12-16 DIAGNOSIS — Q059 Spina bifida, unspecified: Secondary | ICD-10-CM | POA: Diagnosis not present

## 2018-12-16 DIAGNOSIS — J80 Acute respiratory distress syndrome: Secondary | ICD-10-CM

## 2018-12-16 DIAGNOSIS — Z9981 Dependence on supplemental oxygen: Secondary | ICD-10-CM

## 2018-12-16 DIAGNOSIS — F419 Anxiety disorder, unspecified: Secondary | ICD-10-CM

## 2018-12-16 DIAGNOSIS — L89153 Pressure ulcer of sacral region, stage 3: Secondary | ICD-10-CM | POA: Diagnosis not present

## 2018-12-16 DIAGNOSIS — E1122 Type 2 diabetes mellitus with diabetic chronic kidney disease: Secondary | ICD-10-CM

## 2018-12-16 DIAGNOSIS — K219 Gastro-esophageal reflux disease without esophagitis: Secondary | ICD-10-CM

## 2018-12-16 DIAGNOSIS — K5900 Constipation, unspecified: Secondary | ICD-10-CM

## 2018-12-16 DIAGNOSIS — I5032 Chronic diastolic (congestive) heart failure: Secondary | ICD-10-CM | POA: Diagnosis not present

## 2018-12-16 DIAGNOSIS — H409 Unspecified glaucoma: Secondary | ICD-10-CM

## 2018-12-16 DIAGNOSIS — I130 Hypertensive heart and chronic kidney disease with heart failure and stage 1 through stage 4 chronic kidney disease, or unspecified chronic kidney disease: Secondary | ICD-10-CM

## 2018-12-16 DIAGNOSIS — Z6830 Body mass index (BMI) 30.0-30.9, adult: Secondary | ICD-10-CM

## 2018-12-16 DIAGNOSIS — I252 Old myocardial infarction: Secondary | ICD-10-CM | POA: Diagnosis not present

## 2018-12-16 DIAGNOSIS — F4312 Post-traumatic stress disorder, chronic: Secondary | ICD-10-CM

## 2018-12-16 DIAGNOSIS — H3530 Unspecified macular degeneration: Secondary | ICD-10-CM | POA: Diagnosis not present

## 2018-12-16 DIAGNOSIS — U071 COVID-19: Secondary | ICD-10-CM | POA: Diagnosis not present

## 2018-12-16 DIAGNOSIS — Z951 Presence of aortocoronary bypass graft: Secondary | ICD-10-CM

## 2018-12-16 DIAGNOSIS — N182 Chronic kidney disease, stage 2 (mild): Secondary | ICD-10-CM

## 2018-12-16 DIAGNOSIS — E785 Hyperlipidemia, unspecified: Secondary | ICD-10-CM | POA: Diagnosis not present

## 2018-12-16 DIAGNOSIS — K5909 Other constipation: Secondary | ICD-10-CM

## 2018-12-16 DIAGNOSIS — Z87891 Personal history of nicotine dependence: Secondary | ICD-10-CM

## 2018-12-16 DIAGNOSIS — Z7901 Long term (current) use of anticoagulants: Secondary | ICD-10-CM

## 2018-12-16 DIAGNOSIS — Z7984 Long term (current) use of oral hypoglycemic drugs: Secondary | ICD-10-CM

## 2018-12-16 DIAGNOSIS — I2510 Atherosclerotic heart disease of native coronary artery without angina pectoris: Secondary | ICD-10-CM

## 2018-12-16 DIAGNOSIS — F329 Major depressive disorder, single episode, unspecified: Secondary | ICD-10-CM

## 2018-12-17 ENCOUNTER — Ambulatory Visit: Payer: Medicare Other | Admitting: Family Medicine

## 2018-12-18 DIAGNOSIS — Q059 Spina bifida, unspecified: Secondary | ICD-10-CM | POA: Diagnosis not present

## 2018-12-18 DIAGNOSIS — U071 COVID-19: Secondary | ICD-10-CM | POA: Diagnosis not present

## 2018-12-18 DIAGNOSIS — I2510 Atherosclerotic heart disease of native coronary artery without angina pectoris: Secondary | ICD-10-CM | POA: Diagnosis not present

## 2018-12-18 DIAGNOSIS — I252 Old myocardial infarction: Secondary | ICD-10-CM | POA: Diagnosis not present

## 2018-12-18 DIAGNOSIS — L89153 Pressure ulcer of sacral region, stage 3: Secondary | ICD-10-CM | POA: Diagnosis not present

## 2018-12-18 DIAGNOSIS — J80 Acute respiratory distress syndrome: Secondary | ICD-10-CM | POA: Diagnosis not present

## 2018-12-20 DIAGNOSIS — I252 Old myocardial infarction: Secondary | ICD-10-CM | POA: Diagnosis not present

## 2018-12-20 DIAGNOSIS — L89153 Pressure ulcer of sacral region, stage 3: Secondary | ICD-10-CM | POA: Diagnosis not present

## 2018-12-20 DIAGNOSIS — Q059 Spina bifida, unspecified: Secondary | ICD-10-CM | POA: Diagnosis not present

## 2018-12-20 DIAGNOSIS — I2510 Atherosclerotic heart disease of native coronary artery without angina pectoris: Secondary | ICD-10-CM | POA: Diagnosis not present

## 2018-12-20 DIAGNOSIS — J80 Acute respiratory distress syndrome: Secondary | ICD-10-CM | POA: Diagnosis not present

## 2018-12-20 DIAGNOSIS — U071 COVID-19: Secondary | ICD-10-CM | POA: Diagnosis not present

## 2018-12-22 DIAGNOSIS — U071 COVID-19: Secondary | ICD-10-CM | POA: Diagnosis not present

## 2018-12-22 DIAGNOSIS — I2510 Atherosclerotic heart disease of native coronary artery without angina pectoris: Secondary | ICD-10-CM | POA: Diagnosis not present

## 2018-12-22 DIAGNOSIS — I252 Old myocardial infarction: Secondary | ICD-10-CM | POA: Diagnosis not present

## 2018-12-22 DIAGNOSIS — L89153 Pressure ulcer of sacral region, stage 3: Secondary | ICD-10-CM | POA: Diagnosis not present

## 2018-12-22 DIAGNOSIS — J80 Acute respiratory distress syndrome: Secondary | ICD-10-CM | POA: Diagnosis not present

## 2018-12-22 DIAGNOSIS — Q059 Spina bifida, unspecified: Secondary | ICD-10-CM | POA: Diagnosis not present

## 2018-12-23 DIAGNOSIS — L89153 Pressure ulcer of sacral region, stage 3: Secondary | ICD-10-CM | POA: Diagnosis not present

## 2018-12-23 DIAGNOSIS — Q059 Spina bifida, unspecified: Secondary | ICD-10-CM | POA: Diagnosis not present

## 2018-12-23 DIAGNOSIS — U071 COVID-19: Secondary | ICD-10-CM | POA: Diagnosis not present

## 2018-12-23 DIAGNOSIS — I252 Old myocardial infarction: Secondary | ICD-10-CM | POA: Diagnosis not present

## 2018-12-23 DIAGNOSIS — J80 Acute respiratory distress syndrome: Secondary | ICD-10-CM | POA: Diagnosis not present

## 2018-12-23 DIAGNOSIS — I2510 Atherosclerotic heart disease of native coronary artery without angina pectoris: Secondary | ICD-10-CM | POA: Diagnosis not present

## 2018-12-24 DIAGNOSIS — Q059 Spina bifida, unspecified: Secondary | ICD-10-CM | POA: Diagnosis not present

## 2018-12-24 DIAGNOSIS — U071 COVID-19: Secondary | ICD-10-CM | POA: Diagnosis not present

## 2018-12-24 DIAGNOSIS — I2510 Atherosclerotic heart disease of native coronary artery without angina pectoris: Secondary | ICD-10-CM | POA: Diagnosis not present

## 2018-12-24 DIAGNOSIS — L89153 Pressure ulcer of sacral region, stage 3: Secondary | ICD-10-CM | POA: Diagnosis not present

## 2018-12-24 DIAGNOSIS — I252 Old myocardial infarction: Secondary | ICD-10-CM | POA: Diagnosis not present

## 2018-12-24 DIAGNOSIS — J80 Acute respiratory distress syndrome: Secondary | ICD-10-CM | POA: Diagnosis not present

## 2018-12-27 DIAGNOSIS — K5900 Constipation, unspecified: Secondary | ICD-10-CM | POA: Diagnosis not present

## 2018-12-27 DIAGNOSIS — Z951 Presence of aortocoronary bypass graft: Secondary | ICD-10-CM | POA: Diagnosis not present

## 2018-12-27 DIAGNOSIS — E785 Hyperlipidemia, unspecified: Secondary | ICD-10-CM | POA: Diagnosis not present

## 2018-12-27 DIAGNOSIS — L89153 Pressure ulcer of sacral region, stage 3: Secondary | ICD-10-CM | POA: Diagnosis not present

## 2018-12-27 DIAGNOSIS — F419 Anxiety disorder, unspecified: Secondary | ICD-10-CM | POA: Diagnosis not present

## 2018-12-27 DIAGNOSIS — I5032 Chronic diastolic (congestive) heart failure: Secondary | ICD-10-CM | POA: Diagnosis not present

## 2018-12-27 DIAGNOSIS — J80 Acute respiratory distress syndrome: Secondary | ICD-10-CM | POA: Diagnosis not present

## 2018-12-27 DIAGNOSIS — F329 Major depressive disorder, single episode, unspecified: Secondary | ICD-10-CM | POA: Diagnosis not present

## 2018-12-27 DIAGNOSIS — H3530 Unspecified macular degeneration: Secondary | ICD-10-CM | POA: Diagnosis not present

## 2018-12-27 DIAGNOSIS — I252 Old myocardial infarction: Secondary | ICD-10-CM | POA: Diagnosis not present

## 2018-12-27 DIAGNOSIS — H409 Unspecified glaucoma: Secondary | ICD-10-CM | POA: Diagnosis not present

## 2018-12-27 DIAGNOSIS — F4312 Post-traumatic stress disorder, chronic: Secondary | ICD-10-CM | POA: Diagnosis not present

## 2018-12-27 DIAGNOSIS — K5909 Other constipation: Secondary | ICD-10-CM | POA: Diagnosis not present

## 2018-12-27 DIAGNOSIS — Z6830 Body mass index (BMI) 30.0-30.9, adult: Secondary | ICD-10-CM | POA: Diagnosis not present

## 2018-12-27 DIAGNOSIS — I130 Hypertensive heart and chronic kidney disease with heart failure and stage 1 through stage 4 chronic kidney disease, or unspecified chronic kidney disease: Secondary | ICD-10-CM | POA: Diagnosis not present

## 2018-12-27 DIAGNOSIS — N182 Chronic kidney disease, stage 2 (mild): Secondary | ICD-10-CM | POA: Diagnosis not present

## 2018-12-27 DIAGNOSIS — Q059 Spina bifida, unspecified: Secondary | ICD-10-CM | POA: Diagnosis not present

## 2018-12-27 DIAGNOSIS — K219 Gastro-esophageal reflux disease without esophagitis: Secondary | ICD-10-CM | POA: Diagnosis not present

## 2018-12-27 DIAGNOSIS — U071 COVID-19: Secondary | ICD-10-CM | POA: Diagnosis not present

## 2018-12-27 DIAGNOSIS — Z87891 Personal history of nicotine dependence: Secondary | ICD-10-CM | POA: Diagnosis not present

## 2018-12-27 DIAGNOSIS — Z7901 Long term (current) use of anticoagulants: Secondary | ICD-10-CM | POA: Diagnosis not present

## 2018-12-27 DIAGNOSIS — Z7984 Long term (current) use of oral hypoglycemic drugs: Secondary | ICD-10-CM | POA: Diagnosis not present

## 2018-12-27 DIAGNOSIS — I2510 Atherosclerotic heart disease of native coronary artery without angina pectoris: Secondary | ICD-10-CM | POA: Diagnosis not present

## 2018-12-27 DIAGNOSIS — Z9981 Dependence on supplemental oxygen: Secondary | ICD-10-CM | POA: Diagnosis not present

## 2018-12-27 DIAGNOSIS — E1122 Type 2 diabetes mellitus with diabetic chronic kidney disease: Secondary | ICD-10-CM | POA: Diagnosis not present

## 2018-12-28 ENCOUNTER — Telehealth: Payer: Self-pay | Admitting: *Deleted

## 2018-12-28 DIAGNOSIS — Z993 Dependence on wheelchair: Secondary | ICD-10-CM

## 2018-12-28 DIAGNOSIS — U071 COVID-19: Secondary | ICD-10-CM | POA: Diagnosis not present

## 2018-12-28 DIAGNOSIS — J80 Acute respiratory distress syndrome: Secondary | ICD-10-CM | POA: Diagnosis not present

## 2018-12-28 DIAGNOSIS — L89153 Pressure ulcer of sacral region, stage 3: Secondary | ICD-10-CM | POA: Diagnosis not present

## 2018-12-28 DIAGNOSIS — Q059 Spina bifida, unspecified: Secondary | ICD-10-CM | POA: Diagnosis not present

## 2018-12-28 DIAGNOSIS — I2510 Atherosclerotic heart disease of native coronary artery without angina pectoris: Secondary | ICD-10-CM | POA: Diagnosis not present

## 2018-12-28 DIAGNOSIS — I252 Old myocardial infarction: Secondary | ICD-10-CM | POA: Diagnosis not present

## 2018-12-28 NOTE — Telephone Encounter (Signed)
Vm from [place] w/ [facility] Would like Rx for nystatin cream sent to [facility] order for heel/bunny boots to prevent breakdown that is starting. He wears a size 8 shoe

## 2018-12-29 DIAGNOSIS — J80 Acute respiratory distress syndrome: Secondary | ICD-10-CM | POA: Diagnosis not present

## 2018-12-29 DIAGNOSIS — L89153 Pressure ulcer of sacral region, stage 3: Secondary | ICD-10-CM | POA: Diagnosis not present

## 2018-12-29 DIAGNOSIS — Q059 Spina bifida, unspecified: Secondary | ICD-10-CM | POA: Diagnosis not present

## 2018-12-29 DIAGNOSIS — I2510 Atherosclerotic heart disease of native coronary artery without angina pectoris: Secondary | ICD-10-CM | POA: Diagnosis not present

## 2018-12-29 DIAGNOSIS — U071 COVID-19: Secondary | ICD-10-CM | POA: Diagnosis not present

## 2018-12-29 DIAGNOSIS — I252 Old myocardial infarction: Secondary | ICD-10-CM | POA: Diagnosis not present

## 2018-12-30 DIAGNOSIS — U071 COVID-19: Secondary | ICD-10-CM | POA: Diagnosis not present

## 2018-12-30 DIAGNOSIS — L89153 Pressure ulcer of sacral region, stage 3: Secondary | ICD-10-CM | POA: Diagnosis not present

## 2018-12-30 DIAGNOSIS — I252 Old myocardial infarction: Secondary | ICD-10-CM | POA: Diagnosis not present

## 2018-12-30 DIAGNOSIS — Q059 Spina bifida, unspecified: Secondary | ICD-10-CM | POA: Diagnosis not present

## 2018-12-30 DIAGNOSIS — J80 Acute respiratory distress syndrome: Secondary | ICD-10-CM | POA: Diagnosis not present

## 2018-12-30 DIAGNOSIS — I2510 Atherosclerotic heart disease of native coronary artery without angina pectoris: Secondary | ICD-10-CM | POA: Diagnosis not present

## 2018-12-30 MED ORDER — NYSTATIN 100000 UNIT/GM EX CREA: 1.0000 "application " | TOPICAL_CREAM | Freq: Two times a day (BID) | CUTANEOUS | 1 refills | Status: DC

## 2018-12-30 NOTE — Telephone Encounter (Signed)
I have sent the nystatin for the patient, and also printed out the DME and I will sign it tomorrow when I am in office

## 2018-12-30 NOTE — Telephone Encounter (Signed)
Aware , medication sent in.  Please fax order for boots to [facility] in [place].

## 2018-12-30 NOTE — Addendum Note (Signed)
Addended by: Caryl Pina on: 12/30/2018 08:32 AM   Modules accepted: Orders

## 2018-12-31 DIAGNOSIS — H353221 Exudative age-related macular degeneration, left eye, with active choroidal neovascularization: Secondary | ICD-10-CM | POA: Diagnosis not present

## 2019-01-03 DIAGNOSIS — I252 Old myocardial infarction: Secondary | ICD-10-CM | POA: Diagnosis not present

## 2019-01-03 DIAGNOSIS — J80 Acute respiratory distress syndrome: Secondary | ICD-10-CM | POA: Diagnosis not present

## 2019-01-03 DIAGNOSIS — L89153 Pressure ulcer of sacral region, stage 3: Secondary | ICD-10-CM | POA: Diagnosis not present

## 2019-01-03 DIAGNOSIS — Q059 Spina bifida, unspecified: Secondary | ICD-10-CM | POA: Diagnosis not present

## 2019-01-03 DIAGNOSIS — U071 COVID-19: Secondary | ICD-10-CM | POA: Diagnosis not present

## 2019-01-03 DIAGNOSIS — I2510 Atherosclerotic heart disease of native coronary artery without angina pectoris: Secondary | ICD-10-CM | POA: Diagnosis not present

## 2019-01-04 DIAGNOSIS — L89153 Pressure ulcer of sacral region, stage 3: Secondary | ICD-10-CM | POA: Diagnosis not present

## 2019-01-04 DIAGNOSIS — U071 COVID-19: Secondary | ICD-10-CM | POA: Diagnosis not present

## 2019-01-04 DIAGNOSIS — J80 Acute respiratory distress syndrome: Secondary | ICD-10-CM | POA: Diagnosis not present

## 2019-01-04 DIAGNOSIS — I252 Old myocardial infarction: Secondary | ICD-10-CM | POA: Diagnosis not present

## 2019-01-04 DIAGNOSIS — Q059 Spina bifida, unspecified: Secondary | ICD-10-CM | POA: Diagnosis not present

## 2019-01-04 DIAGNOSIS — I2510 Atherosclerotic heart disease of native coronary artery without angina pectoris: Secondary | ICD-10-CM | POA: Diagnosis not present

## 2019-01-05 DIAGNOSIS — H353211 Exudative age-related macular degeneration, right eye, with active choroidal neovascularization: Secondary | ICD-10-CM | POA: Diagnosis not present

## 2019-01-07 DIAGNOSIS — J80 Acute respiratory distress syndrome: Secondary | ICD-10-CM | POA: Diagnosis not present

## 2019-01-07 DIAGNOSIS — Q059 Spina bifida, unspecified: Secondary | ICD-10-CM | POA: Diagnosis not present

## 2019-01-07 DIAGNOSIS — U071 COVID-19: Secondary | ICD-10-CM | POA: Diagnosis not present

## 2019-01-07 DIAGNOSIS — I2510 Atherosclerotic heart disease of native coronary artery without angina pectoris: Secondary | ICD-10-CM | POA: Diagnosis not present

## 2019-01-07 DIAGNOSIS — L89153 Pressure ulcer of sacral region, stage 3: Secondary | ICD-10-CM | POA: Diagnosis not present

## 2019-01-07 DIAGNOSIS — I252 Old myocardial infarction: Secondary | ICD-10-CM | POA: Diagnosis not present

## 2019-01-10 DIAGNOSIS — I2510 Atherosclerotic heart disease of native coronary artery without angina pectoris: Secondary | ICD-10-CM | POA: Diagnosis not present

## 2019-01-10 DIAGNOSIS — L89153 Pressure ulcer of sacral region, stage 3: Secondary | ICD-10-CM | POA: Diagnosis not present

## 2019-01-10 DIAGNOSIS — U071 COVID-19: Secondary | ICD-10-CM | POA: Diagnosis not present

## 2019-01-10 DIAGNOSIS — Q059 Spina bifida, unspecified: Secondary | ICD-10-CM | POA: Diagnosis not present

## 2019-01-10 DIAGNOSIS — I252 Old myocardial infarction: Secondary | ICD-10-CM | POA: Diagnosis not present

## 2019-01-10 DIAGNOSIS — J80 Acute respiratory distress syndrome: Secondary | ICD-10-CM | POA: Diagnosis not present

## 2019-01-12 DIAGNOSIS — L89153 Pressure ulcer of sacral region, stage 3: Secondary | ICD-10-CM | POA: Diagnosis not present

## 2019-01-12 DIAGNOSIS — I252 Old myocardial infarction: Secondary | ICD-10-CM | POA: Diagnosis not present

## 2019-01-12 DIAGNOSIS — J80 Acute respiratory distress syndrome: Secondary | ICD-10-CM | POA: Diagnosis not present

## 2019-01-12 DIAGNOSIS — Q059 Spina bifida, unspecified: Secondary | ICD-10-CM | POA: Diagnosis not present

## 2019-01-12 DIAGNOSIS — U071 COVID-19: Secondary | ICD-10-CM | POA: Diagnosis not present

## 2019-01-12 DIAGNOSIS — I2510 Atherosclerotic heart disease of native coronary artery without angina pectoris: Secondary | ICD-10-CM | POA: Diagnosis not present

## 2019-01-13 DIAGNOSIS — U071 COVID-19: Secondary | ICD-10-CM | POA: Diagnosis not present

## 2019-01-13 DIAGNOSIS — I2510 Atherosclerotic heart disease of native coronary artery without angina pectoris: Secondary | ICD-10-CM | POA: Diagnosis not present

## 2019-01-13 DIAGNOSIS — Q059 Spina bifida, unspecified: Secondary | ICD-10-CM | POA: Diagnosis not present

## 2019-01-13 DIAGNOSIS — J80 Acute respiratory distress syndrome: Secondary | ICD-10-CM | POA: Diagnosis not present

## 2019-01-13 DIAGNOSIS — L89153 Pressure ulcer of sacral region, stage 3: Secondary | ICD-10-CM | POA: Diagnosis not present

## 2019-01-13 DIAGNOSIS — I252 Old myocardial infarction: Secondary | ICD-10-CM | POA: Diagnosis not present

## 2019-01-17 DIAGNOSIS — L89153 Pressure ulcer of sacral region, stage 3: Secondary | ICD-10-CM | POA: Diagnosis not present

## 2019-01-17 DIAGNOSIS — Q059 Spina bifida, unspecified: Secondary | ICD-10-CM | POA: Diagnosis not present

## 2019-01-17 DIAGNOSIS — I252 Old myocardial infarction: Secondary | ICD-10-CM | POA: Diagnosis not present

## 2019-01-17 DIAGNOSIS — U071 COVID-19: Secondary | ICD-10-CM | POA: Diagnosis not present

## 2019-01-17 DIAGNOSIS — I2510 Atherosclerotic heart disease of native coronary artery without angina pectoris: Secondary | ICD-10-CM | POA: Diagnosis not present

## 2019-01-17 DIAGNOSIS — J80 Acute respiratory distress syndrome: Secondary | ICD-10-CM | POA: Diagnosis not present

## 2019-01-25 DIAGNOSIS — U071 COVID-19: Secondary | ICD-10-CM | POA: Diagnosis not present

## 2019-01-25 DIAGNOSIS — J80 Acute respiratory distress syndrome: Secondary | ICD-10-CM | POA: Diagnosis not present

## 2019-01-25 DIAGNOSIS — I2510 Atherosclerotic heart disease of native coronary artery without angina pectoris: Secondary | ICD-10-CM | POA: Diagnosis not present

## 2019-01-25 DIAGNOSIS — I252 Old myocardial infarction: Secondary | ICD-10-CM | POA: Diagnosis not present

## 2019-01-25 DIAGNOSIS — Q059 Spina bifida, unspecified: Secondary | ICD-10-CM | POA: Diagnosis not present

## 2019-01-25 DIAGNOSIS — L89153 Pressure ulcer of sacral region, stage 3: Secondary | ICD-10-CM | POA: Diagnosis not present

## 2019-01-26 DIAGNOSIS — I2510 Atherosclerotic heart disease of native coronary artery without angina pectoris: Secondary | ICD-10-CM | POA: Diagnosis not present

## 2019-01-26 DIAGNOSIS — K5900 Constipation, unspecified: Secondary | ICD-10-CM | POA: Diagnosis not present

## 2019-01-26 DIAGNOSIS — Z9981 Dependence on supplemental oxygen: Secondary | ICD-10-CM | POA: Diagnosis not present

## 2019-01-26 DIAGNOSIS — F329 Major depressive disorder, single episode, unspecified: Secondary | ICD-10-CM | POA: Diagnosis not present

## 2019-01-26 DIAGNOSIS — Q059 Spina bifida, unspecified: Secondary | ICD-10-CM | POA: Diagnosis not present

## 2019-01-26 DIAGNOSIS — H3530 Unspecified macular degeneration: Secondary | ICD-10-CM | POA: Diagnosis not present

## 2019-01-26 DIAGNOSIS — I5032 Chronic diastolic (congestive) heart failure: Secondary | ICD-10-CM | POA: Diagnosis not present

## 2019-01-26 DIAGNOSIS — E1140 Type 2 diabetes mellitus with diabetic neuropathy, unspecified: Secondary | ICD-10-CM | POA: Diagnosis not present

## 2019-01-26 DIAGNOSIS — E785 Hyperlipidemia, unspecified: Secondary | ICD-10-CM | POA: Diagnosis not present

## 2019-01-26 DIAGNOSIS — Z7901 Long term (current) use of anticoagulants: Secondary | ICD-10-CM | POA: Diagnosis not present

## 2019-01-26 DIAGNOSIS — Z87891 Personal history of nicotine dependence: Secondary | ICD-10-CM | POA: Diagnosis not present

## 2019-01-26 DIAGNOSIS — L89153 Pressure ulcer of sacral region, stage 3: Secondary | ICD-10-CM | POA: Diagnosis not present

## 2019-01-26 DIAGNOSIS — F4312 Post-traumatic stress disorder, chronic: Secondary | ICD-10-CM | POA: Diagnosis not present

## 2019-01-26 DIAGNOSIS — N182 Chronic kidney disease, stage 2 (mild): Secondary | ICD-10-CM | POA: Diagnosis not present

## 2019-01-26 DIAGNOSIS — Z7984 Long term (current) use of oral hypoglycemic drugs: Secondary | ICD-10-CM | POA: Diagnosis not present

## 2019-01-26 DIAGNOSIS — F419 Anxiety disorder, unspecified: Secondary | ICD-10-CM | POA: Diagnosis not present

## 2019-01-26 DIAGNOSIS — E1122 Type 2 diabetes mellitus with diabetic chronic kidney disease: Secondary | ICD-10-CM | POA: Diagnosis not present

## 2019-01-26 DIAGNOSIS — Z6830 Body mass index (BMI) 30.0-30.9, adult: Secondary | ICD-10-CM | POA: Diagnosis not present

## 2019-01-26 DIAGNOSIS — I130 Hypertensive heart and chronic kidney disease with heart failure and stage 1 through stage 4 chronic kidney disease, or unspecified chronic kidney disease: Secondary | ICD-10-CM | POA: Diagnosis not present

## 2019-01-26 DIAGNOSIS — Z8619 Personal history of other infectious and parasitic diseases: Secondary | ICD-10-CM | POA: Diagnosis not present

## 2019-01-26 DIAGNOSIS — Z951 Presence of aortocoronary bypass graft: Secondary | ICD-10-CM | POA: Diagnosis not present

## 2019-01-26 DIAGNOSIS — K219 Gastro-esophageal reflux disease without esophagitis: Secondary | ICD-10-CM | POA: Diagnosis not present

## 2019-01-26 DIAGNOSIS — H409 Unspecified glaucoma: Secondary | ICD-10-CM | POA: Diagnosis not present

## 2019-01-27 DIAGNOSIS — I5032 Chronic diastolic (congestive) heart failure: Secondary | ICD-10-CM | POA: Diagnosis not present

## 2019-01-27 DIAGNOSIS — E1122 Type 2 diabetes mellitus with diabetic chronic kidney disease: Secondary | ICD-10-CM | POA: Diagnosis not present

## 2019-01-27 DIAGNOSIS — I130 Hypertensive heart and chronic kidney disease with heart failure and stage 1 through stage 4 chronic kidney disease, or unspecified chronic kidney disease: Secondary | ICD-10-CM | POA: Diagnosis not present

## 2019-01-27 DIAGNOSIS — Q059 Spina bifida, unspecified: Secondary | ICD-10-CM | POA: Diagnosis not present

## 2019-01-27 DIAGNOSIS — L89153 Pressure ulcer of sacral region, stage 3: Secondary | ICD-10-CM | POA: Diagnosis not present

## 2019-01-27 DIAGNOSIS — I2510 Atherosclerotic heart disease of native coronary artery without angina pectoris: Secondary | ICD-10-CM | POA: Diagnosis not present

## 2019-02-01 ENCOUNTER — Ambulatory Visit: Payer: Medicare Other | Admitting: Physician Assistant

## 2019-02-02 ENCOUNTER — Ambulatory Visit: Payer: Self-pay | Admitting: Family Medicine

## 2019-02-03 ENCOUNTER — Encounter: Payer: Medicare Other | Attending: Physician Assistant | Admitting: Physician Assistant

## 2019-02-03 ENCOUNTER — Other Ambulatory Visit: Payer: Self-pay

## 2019-02-03 DIAGNOSIS — Z886 Allergy status to analgesic agent status: Secondary | ICD-10-CM | POA: Diagnosis not present

## 2019-02-03 DIAGNOSIS — L89610 Pressure ulcer of right heel, unstageable: Secondary | ICD-10-CM | POA: Diagnosis not present

## 2019-02-03 DIAGNOSIS — Z882 Allergy status to sulfonamides status: Secondary | ICD-10-CM | POA: Insufficient documentation

## 2019-02-03 DIAGNOSIS — I10 Essential (primary) hypertension: Secondary | ICD-10-CM | POA: Diagnosis not present

## 2019-02-03 DIAGNOSIS — Z8249 Family history of ischemic heart disease and other diseases of the circulatory system: Secondary | ICD-10-CM | POA: Insufficient documentation

## 2019-02-03 DIAGNOSIS — Z881 Allergy status to other antibiotic agents status: Secondary | ICD-10-CM | POA: Insufficient documentation

## 2019-02-03 DIAGNOSIS — E1140 Type 2 diabetes mellitus with diabetic neuropathy, unspecified: Secondary | ICD-10-CM | POA: Diagnosis not present

## 2019-02-03 DIAGNOSIS — L89899 Pressure ulcer of other site, unspecified stage: Secondary | ICD-10-CM | POA: Insufficient documentation

## 2019-02-03 DIAGNOSIS — M1990 Unspecified osteoarthritis, unspecified site: Secondary | ICD-10-CM | POA: Insufficient documentation

## 2019-02-03 DIAGNOSIS — I252 Old myocardial infarction: Secondary | ICD-10-CM | POA: Diagnosis not present

## 2019-02-03 DIAGNOSIS — Z951 Presence of aortocoronary bypass graft: Secondary | ICD-10-CM | POA: Diagnosis not present

## 2019-02-03 DIAGNOSIS — R32 Unspecified urinary incontinence: Secondary | ICD-10-CM | POA: Diagnosis not present

## 2019-02-03 DIAGNOSIS — E11621 Type 2 diabetes mellitus with foot ulcer: Secondary | ICD-10-CM | POA: Diagnosis present

## 2019-02-03 DIAGNOSIS — L89619 Pressure ulcer of right heel, unspecified stage: Secondary | ICD-10-CM | POA: Diagnosis not present

## 2019-02-03 DIAGNOSIS — Z880 Allergy status to penicillin: Secondary | ICD-10-CM | POA: Insufficient documentation

## 2019-02-03 DIAGNOSIS — Z87891 Personal history of nicotine dependence: Secondary | ICD-10-CM | POA: Diagnosis not present

## 2019-02-03 NOTE — Progress Notes (Signed)
Bobby, Ho ([id number]) Visit Report for 02/03/2019 Allergy List Details Patient Name: [name], [name]. Date of Service: 02/03/2019 2:15 PM Medical Record Number: [id number] Patient Account Number: [id number] Date of Birth/Sex: 1930/04/02 (85 y.o. M) Treating RN: Montey Hora Primary Care Rucha Wissinger: Caryl Pina Other Clinician: Referring Makayla Lanter: Referral, Self Treating Higinio Grow/Extender: STONE III, [name]: 0 Allergies Active Allergies Sulfa (Sulfonamide Antibiotics) Reaction: rash aspirin Reaction: rash with higher dose than 81mg  Allergy Notes Electronic Signature(s) Signed: 02/03/2019 3:25:11 PM By: Montey Hora Entered By: Montey Hora on 02/03/2019 14:52:47 [name], [name] ([id number]) -------------------------------------------------------------------------------- Arrival Information Details Patient Name: [name], [name]. Date of Service: 02/03/2019 2:15 PM Medical Record Number: [id number] Patient Account Number: [id number] Date of Birth/Sex: 08/16/1930 (85 y.o. M) Treating RN: Army Melia Primary Care Jaianna Nicoll: Caryl Pina Other Clinician: Referring Asuna Peth: Referral, Self Treating Sharnay Cashion/Extender: Melburn Hake, [name]: 0 Visit Information Patient Arrived: Wheel Chair Arrival Time: 14:42 Accompanied By: son Transfer Assistance: Hoyer Lift Patient Identification Verified: Yes Secondary Verification Process Completed: Yes Electronic Signature(s) Signed: 02/03/2019 3:22:29 PM By: Lorine Bears RCP, RRT, CHT Entered By: Lorine Bears on 02/03/2019 14:42:50 [name], [name] ([id number]) -------------------------------------------------------------------------------- Clinic Level of Care Assessment Details Patient Name: [name], [name]. Date of Service: 02/03/2019 2:15 PM Medical Record Number: [id number] Patient Account Number: [id number] Date of Birth/Sex: [birth year] (85 y.o.  M) Treating RN: Army Melia Primary Care Isabellah Sobocinski: Caryl Pina Other Clinician: Referring Tane Biegler: Referral, Self Treating Damean Poffenberger/Extender: STONE III, [name]: 0 Clinic Level of Care Assessment Items TOOL 1 Quantity Score []  - Use when EandM and Procedure is performed on INITIAL visit 0 ASSESSMENTS - Nursing Assessment / Reassessment X - General Physical Exam (combine w/ comprehensive assessment (listed just below) when 1 20 performed on new pt. evals) X- 1 25 Comprehensive Assessment (HX, ROS, Risk Assessments, Wounds Hx, etc.) ASSESSMENTS - Wound and Skin Assessment / Reassessment []  - Dermatologic / Skin Assessment (not related to wound area) 0 ASSESSMENTS - Ostomy and/or Continence Assessment and Care []  - Incontinence Assessment and Management 0 []  - 0 Ostomy Care Assessment and Management (repouching, etc.) PROCESS - Coordination of Care X - Simple Patient / Family Education for ongoing care 1 15 []  - 0 Complex (extensive) Patient / Family Education for ongoing care X- 1 10 Staff obtains Programmer, systems, Records, Test Results / Process Orders []  - 0 Staff telephones HHA, Nursing Homes / Clarify orders / etc []  - 0 Routine Transfer to another Facility (non-emergent condition) []  - 0 Routine Hospital Admission (non-emergent condition) X- 1 15 New Admissions / Biomedical engineer / Ordering NPWT, Apligraf, etc. []  - 0 Emergency Hospital Admission (emergent condition) PROCESS - Special Needs []  - Pediatric / Minor Patient Management 0 []  - 0 Isolation Patient Management []  - 0 Hearing / Language / Visual special needs []  - 0 Assessment of Community assistance (transportation, D/C planning, etc.) []  - 0 Additional assistance / Altered mentation []  - 0 Support Surface(s) Assessment (bed, cushion, seat, etc.) [name], [name]. ([id number]) INTERVENTIONS - Miscellaneous []  - External ear exam 0 []  - 0 Patient Transfer (multiple staff / Librarian, academic / Similar devices) []  - 0 Simple Staple / Suture removal (25 or less) []  - 0 Complex Staple / Suture removal (26 or more) []  - 0 Hypo/Hyperglycemic Management (do not check if billed separately) []  - 0 Ankle / Brachial Index (ABI) - do not check if billed separately Has the patient been seen at the hospital  within the last three years: Yes Total Score: 85 Level Of Care: New/Established - Level 3 Electronic Signature(s) Signed: 02/03/2019 4:29:54 PM By: Army Melia Entered By: Army Melia on 02/03/2019 15:48:48 [name], [name] ([id number]) -------------------------------------------------------------------------------- Encounter Discharge Information Details Patient Name: [name], [name]. Date of Service: 02/03/2019 2:15 PM Medical Record Number: [id number] Patient Account Number: [id number] Date of Birth/Sex: July 30, 1930 (85 y.o. M) Treating RN: Army Melia Primary Care Feliciano Wynter: Caryl Pina Other Clinician: Referring Graiden Henes: Referral, Self Treating Kieren Adkison/Extender: Melburn Hake, [name]: 0 Encounter Discharge Information Items Post Procedure Vitals Discharge Condition: Stable Temperature (F): 97.6 Ambulatory Status: Wheelchair Pulse (bpm): 92 Discharge Destination: Home Respiratory Rate (breaths/min): 16 Transportation: Private Auto Blood Pressure (mmHg): 126/98 Accompanied By: son Schedule Follow-up Appointment: Yes Clinical Summary of Care: Electronic Signature(s) Signed: 02/03/2019 4:29:54 PM By: Army Melia Entered By: Army Melia on 02/03/2019 15:50:05 [name], [name] ([id number]) -------------------------------------------------------------------------------- Lower Extremity Assessment Details Patient Name: [name], [name]. Date of Service: 02/03/2019 2:15 PM Medical Record Number: [id number] Patient Account Number: [id number] Date of Birth/Sex: 05/14/1930 (85 y.o. M) Treating RN: Montey Hora Primary Care Araceli Arango:  Caryl Pina Other Clinician: Referring Mariapaula Krist: Referral, Self Treating Aaliyah Gavel/Extender: STONE III, [name]: 0 Edema Assessment Assessed: [Left: No] [Right: No] Edema: [Left: No] [Right: No] Calf Left: Right: Point of Measurement: 33 cm From Medial Instep 29.4 cm 30 cm Ankle Left: Right: Point of Measurement: 10 cm From Medial Instep 21 cm 23 cm Vascular Assessment Pulses: Dorsalis Pedis Palpable: [Left:Yes] [Right:Yes] Doppler Audible: [Left:Yes] [Right:Yes] Posterior Tibial Palpable: [Left:Yes Yes] [Right:Yes Yes] Notes ABI [place] BILATERAL >220 Electronic Signature(s) Signed: 02/03/2019 3:25:11 PM By: Montey Hora Entered By: Montey Hora on 02/03/2019 15:11:05 Cerniglia, [name] ([id number]) -------------------------------------------------------------------------------- Multi Wound Chart Details Patient Name: [name]. Date of Service: 02/03/2019 2:15 PM Medical Record Number: [id number] Patient Account Number: [id number] Date of Birth/Sex: 1930-04-05 (85 y.o. M) Treating RN: Army Melia Primary Care Josephanthony Tindel: Caryl Pina Other Clinician: Referring Zaide Kardell: Referral, Self Treating Deeanna Beightol/Extender: STONE III, [name]: 0 Vital Signs Height(in): 68 Pulse(bpm): 92 Weight(lbs): 235 Blood Pressure(mmHg): 126/98 Body Mass Index(BMI): 36 Temperature(F): 97.6 Respiratory Rate 16 (breaths/min): Photos: Wound Location: Right Calcaneus Right Toe Fourth Right Toe Fifth Wounding Event: Gradually Appeared Gradually Appeared Pressure Injury Primary Etiology: Pressure Ulcer Pressure Ulcer Pressure Ulcer Comorbid History: Cataracts, Hypertension, Cataracts, Hypertension, Cataracts, Hypertension, Myocardial Infarction, Type II Myocardial Infarction, Type II Myocardial Infarction, Type II Diabetes, Osteoarthritis, Diabetes, Osteoarthritis, Diabetes, Osteoarthritis, Neuropathy Neuropathy Neuropathy Date Acquired:  12/05/2018 09/05/2018 09/05/2018 Weeks of Treatment: 0 0 0 Wound Status: Open Open Open Measurements L x W x D 2.8x3.2x0.5 0.3x0.4x0.1 1x0.4x0.1 (cm) Area (cm) : 7.037 0.094 0.314 Volume (cm) : 3.519 0.009 0.031 Classification: Unstageable/Unclassified Unstageable/Unclassified Unstageable/Unclassified Exudate Amount: Medium Medium Medium Exudate Type: Serous Serous Serous Exudate Color: amber amber amber Wound Margin: Flat and Intact Flat and Intact Flat and Intact Granulation Amount: None Present (0%) None Present (0%) None Present (0%) Necrotic Amount: Large (67-100%) Large (67-100%) Large (67-100%) Necrotic Tissue: N/A Eschar Eschar Exposed Structures: Fat Layer (Subcutaneous Fat Layer (Subcutaneous Fat Layer (Subcutaneous Tissue) Exposed: Yes Tissue) Exposed: Yes Tissue) Exposed: Yes Fascia: No Fascia: No Fascia: No Tendon: No Tendon: No Tendon: No Muscle: No Muscle: No Muscle: No Joint: No Joint: No Joint: No Bone: No Bone: No Bone: No SALVATORE, [name]. ([id number]) Epithelialization: None None None Treatment Notes Electronic Signature(s) Signed: 02/03/2019 4:29:54 PM By: Army Melia Entered By: Army Melia on 02/03/2019 15:36:04 Britten, Draven  Lenna Sciara ([id number]) -------------------------------------------------------------------------------- [place] Details Patient Name: [name], [name]. Date of Service: 02/03/2019 2:15 PM Medical Record Number: [id number] Patient Account Number: [id number] Date of Birth/Sex: [birth year] (85 y.o. M) Treating RN: Army Melia Primary Care Jerriann Schrom: Caryl Pina Other Clinician: Referring Markevion Lattin: Referral, Self Treating Glenda Spelman/Extender: Melburn Hake, [name]: 0 Active Inactive Orientation to the Wound Care Program Nursing Diagnoses: Knowledge deficit related to the wound healing center program Goals: Patient/caregiver will verbalize understanding of the [place]  Program Date Initiated: 02/03/2019 Target Resolution Date: 03/04/2019 Goal Status: Active Interventions: Provide education on orientation to the wound center Notes: Pressure Nursing Diagnoses: Knowledge deficit related to causes and risk factors for pressure ulcer development Goals: Patient/caregiver will verbalize risk factors for pressure ulcer development Date Initiated: 02/03/2019 Target Resolution Date: 03/04/2019 Goal Status: Active Interventions: Assess: immobility, friction, shearing, incontinence upon admission and as needed Notes: Wound/Skin Impairment Nursing Diagnoses: Impaired tissue integrity Goals: Ulcer/skin breakdown will have a volume reduction of 30% by week 4 Date Initiated: 02/03/2019 Target Resolution Date: 03/04/2019 Goal Status: Active Interventions: Assess ulceration(s) every visit [name], [name] ([id number]) Notes: Electronic Signature(s) Signed: 02/03/2019 4:29:54 PM By: Army Melia Entered By: Army Melia on 02/03/2019 15:35:23 Damiano, [name] ([id number]) -------------------------------------------------------------------------------- Pain Assessment Details Patient Name: [name]. Date of Service: 02/03/2019 2:15 PM Medical Record Number: [id number] Patient Account Number: [id number] Date of Birth/Sex: 1931/01/09 (85 y.o. M) Treating RN: Army Melia Primary Care Robbie Rideaux: Caryl Pina Other Clinician: Referring Shalaine Payson: Referral, Self Treating Durenda Pechacek/Extender: STONE III, [name]: 0 Active Problems Location of Pain Severity and Description of Pain Patient Has Paino No Site Locations Pain Management and Medication Current Pain Management: Electronic Signature(s) Signed: 02/03/2019 3:22:29 PM By: Paulla Fore, RRT, CHT Signed: 02/03/2019 4:29:54 PM By: Army Melia Entered By: Lorine Bears on 02/03/2019 14:43:15 Dethlefs, [name]  ([id number]) -------------------------------------------------------------------------------- Patient/Caregiver Education Details Patient Name: AASHRITH, EVES. Date of Service: 02/03/2019 2:15 PM Medical Record Number: [id number] Patient Account Number: [id number] Date of Birth/Gender: December 01, 1930 (85 y.o. M) Treating RN: Army Melia Primary Care Physician: Caryl Pina Other Clinician: Referring Physician: Referral, Self Treating Physician/Extender: Melburn Hake, [name]: 0 Education Assessment Education Provided To: Patient Education Topics Provided Wound/Skin Impairment: Handouts: Caring for Your Ulcer Methods: Demonstration, Explain/Verbal Responses: State content correctly Electronic Signature(s) Signed: 02/03/2019 4:29:54 PM By: Army Melia Entered By: Army Melia on 02/03/2019 15:49:03 Clary, [name] ([id number]) -------------------------------------------------------------------------------- Wound Assessment Details Patient Name: [name], [name]. Date of Service: 02/03/2019 2:15 PM Medical Record Number: [id number] Patient Account Number: [id number] Date of Birth/Sex: 05-Jul-1930 (85 y.o. M) Treating RN: Montey Hora Primary Care Iylah Dworkin: Caryl Pina Other Clinician: Referring Najmah Carradine: Referral, Self Treating Valentine Kuechle/Extender: STONE III, [name]: 0 Wound Status Wound Number: 1 Primary Pressure Ulcer Etiology: Wound Location: Right Calcaneus Wound Open Wounding Event: Gradually Appeared Status: Date Acquired: 12/05/2018 Comorbid Cataracts, Hypertension, Myocardial Infarction, Weeks Of Treatment: 0 History: Type II Diabetes, Osteoarthritis, Neuropathy Clustered Wound: No Photos Wound Measurements Length: (cm) 2.8 % Reduction in Width: (cm) 3.2 % Reduction in Depth: (cm) 0.5 Epithelializat Area: (cm) 7.037 Tunneling: Volume: (cm) 3.519 Undermining: Area: Volume: ion: None No No Wound  Description Classification: Unstageable/Unclassified Foul Odor Aft Wound Margin: Flat and Intact Slough/Fibrin Exudate Amount: Medium Exudate Type: Serous Exudate Color: amber er Cleansing: No o Yes Wound Bed Granulation Amount: None Present (0%) Exposed Structure Necrotic Amount: Large (67-100%) Fascia Exposed: No Fat Layer (Subcutaneous Tissue) Exposed: Yes  Tendon Exposed: No Muscle Exposed: No Joint Exposed: No Bone Exposed: No Treatment Notes [name], [name] ([id number]) Wound #1 (Right Calcaneus) Notes betadine to toes, iodoflex, ABD, Conform, heel cup to right heel Electronic Signature(s) Signed: 02/03/2019 3:25:11 PM By: Montey Hora Entered By: Montey Hora on 02/03/2019 15:06:09 Sima, [name] ([id number]) -------------------------------------------------------------------------------- Wound Assessment Details Patient Name: [name], [name]. Date of Service: 02/03/2019 2:15 PM Medical Record Number: [id number] Patient Account Number: [id number] Date of Birth/Sex: 01-17-1931 (85 y.o. M) Treating RN: Montey Hora Primary Care Tamiya Colello: Caryl Pina Other Clinician: Referring Marrianne Sica: Referral, Self Treating Graham Hyun/Extender: STONE III, [name]: 0 Wound Status Wound Number: 2 Primary Pressure Ulcer Etiology: Wound Location: Right Toe Fourth Wound Open Wounding Event: Gradually Appeared Status: Date Acquired: 09/05/2018 Comorbid Cataracts, Hypertension, Myocardial Infarction, Weeks Of Treatment: 0 History: Type II Diabetes, Osteoarthritis, Neuropathy Clustered Wound: No Photos Wound Measurements Length: (cm) 0.3 % Reduction in Width: (cm) 0.4 % Reduction in Depth: (cm) 0.1 Epithelializat Area: (cm) 0.094 Tunneling: Volume: (cm) 0.009 Undermining: Area: Volume: ion: None No No Wound Description Classification: Unstageable/Unclassified Foul Odor Aft Wound Margin: Flat and Intact Slough/Fibrin Exudate Amount:  Medium Exudate Type: Serous Exudate Color: amber er Cleansing: No o No Wound Bed Granulation Amount: None Present (0%) Exposed Structure Necrotic Amount: Large (67-100%) Fascia Exposed: No Necrotic Quality: Eschar Fat Layer (Subcutaneous Tissue) Exposed: Yes Tendon Exposed: No Muscle Exposed: No Joint Exposed: No Bone Exposed: No Treatment Notes [name], [name] ([id number]) Wound #2 (Right Toe Fourth) Notes betadine to toes, iodoflex, ABD, Conform, heel cup to right heel Electronic Signature(s) Signed: 02/03/2019 3:25:11 PM By: Montey Hora Entered By: Montey Hora on 02/03/2019 15:07:31 Borjon, [name] ([id number]) -------------------------------------------------------------------------------- Wound Assessment Details Patient Name: [name]. Date of Service: 02/03/2019 2:15 PM Medical Record Number: [id number] Patient Account Number: [id number] Date of Birth/Sex: 01/14/1931 (85 y.o. M) Treating RN: Montey Hora Primary Care Kashmere Daywalt: Caryl Pina Other Clinician: Referring Olney Monier: Referral, Self Treating Horacio Werth/Extender: STONE III, [name]: 0 Wound Status Wound Number: 3 Primary Pressure Ulcer Etiology: Wound Location: Right Toe Fifth Wound Open Wounding Event: Pressure Injury Status: Date Acquired: 09/05/2018 Comorbid Cataracts, Hypertension, Myocardial Infarction, Weeks Of Treatment: 0 History: Type II Diabetes, Osteoarthritis, Neuropathy Clustered Wound: No Photos Wound Measurements Length: (cm) 1 % Reduction i Width: (cm) 0.4 % Reduction i Depth: (cm) 0.1 Epithelializa Area: (cm) 0.314 Tunneling: Volume: (cm) 0.031 Undermining: n Area: n Volume: tion: None No No Wound Description Classification: Unstageable/Unclassified Foul Odor Af Wound Margin: Flat and Intact Slough/Fibri Exudate Amount: Medium Exudate Type: Serous Exudate Color: amber ter Cleansing: No no No Wound Bed Granulation Amount: None  Present (0%) Exposed Structure Necrotic Amount: Large (67-100%) Fascia Exposed: No Necrotic Quality: Eschar Fat Layer (Subcutaneous Tissue) Exposed: Yes Tendon Exposed: No Muscle Exposed: No Joint Exposed: No Bone Exposed: No Treatment Notes [name], CALLENS ([id number]) Wound #3 (Right Toe Fifth) Notes betadine to toes, iodoflex, ABD, Conform, heel cup to right heel Electronic Signature(s) Signed: 02/03/2019 3:25:11 PM By: Montey Hora Entered By: Montey Hora on 02/03/2019 15:08:49 Fullam, [name] ([id number]) -------------------------------------------------------------------------------- Vitals Details Patient Name: [name]. Date of Service: 02/03/2019 2:15 PM Medical Record Number: [id number] Patient Account Number: [id number] Date of Birth/Sex: 10-24-1930 (85 y.o. M) Treating RN: Army Melia Primary Care Aadam Zhen: Caryl Pina Other Clinician: Referring Anagha Loseke: Referral, Self Treating Kilah Drahos/Extender: STONE III, [name]: 0 Vital Signs Time Taken: 14:42 Temperature (F): 97.6 Height (in): 68 Pulse (bpm): 92 Source: Stated Respiratory  Rate (breaths/min): 16 Weight (lbs): 235 Blood Pressure (mmHg): 126/98 Source: Stated Reference Range: 80 - 120 mg / dl Body Mass Index (BMI): 35.7 Electronic Signature(s) Signed: 02/03/2019 3:22:29 PM By: Lorine Bears RCP, RRT, CHT Entered By: Lorine Bears on 02/03/2019 14:44:52

## 2019-02-03 NOTE — Progress Notes (Signed)
DONDRE, CATALFAMO ([id number]) Visit Report for 02/03/2019 Abuse/Suicide Risk Screen Details Patient Name: [name], [name]. Date of Service: 02/03/2019 2:15 PM Medical Record Number: [id number] Patient Account Number: [id number] Date of Birth/Sex: [birth year] (85 y.o. M) Treating RN: Montey Hora Primary Care Sharmel Ballantine: Caryl Pina Other Clinician: Referring Verginia Toohey: Referral, Self Treating Maicie Vanderloop/Extender: STONE III, [name]: 0 Abuse/Suicide Risk Screen Items Answer ABUSE RISK SCREEN: Has anyone close to you tried to hurt or harm you recentlyo No Do you feel uncomfortable with anyone in your familyo No Has anyone forced you do things that you didnot want to doo No Electronic Signature(s) Signed: 02/03/2019 3:25:11 PM By: Montey Hora Entered By: Montey Hora on 02/03/2019 14:52:57 Vandermeulen, [name] ([id number]) -------------------------------------------------------------------------------- Activities of Daily Living Details Patient Name: [name], [name]. Date of Service: 02/03/2019 2:15 PM Medical Record Number: [id number] Patient Account Number: [id number] Date of Birth/Sex: [birth year] (85 y.o. M) Treating RN: Montey Hora Primary Care Yareli Carthen: Caryl Pina Other Clinician: Referring Iram Astorino: Referral, Self Treating Belinda Bringhurst/Extender: STONE III, [name]: 0 Activities of Daily Living Items Answer Activities of Daily Living (Please select one for each item) Drive Automobile Not Able Take Medications Completely Able Use Telephone Completely Able Care for Appearance Need Assistance Use Toilet Need Assistance Bath / Shower Need Assistance Dress Self Need Assistance Feed Self Completely Able Walk Need Assistance Get In / Out Bed Need Assistance Housework Need Assistance Prepare Meals Need Assistance Handle Money Completely Able Shop for Self Need Assistance Electronic Signature(s) Signed: 02/03/2019 3:25:11 PM By:  Montey Hora Entered By: Montey Hora on 02/03/2019 14:53:30 Rosendahl, [name] ([id number]) -------------------------------------------------------------------------------- Education Screening Details Patient Name: [name], [name]. Date of Service: 02/03/2019 2:15 PM Medical Record Number: [id number] Patient Account Number: [id number] Date of Birth/Sex: [birth year] (85 y.o. M) Treating RN: Montey Hora Primary Care Delta Pichon: Caryl Pina Other Clinician: Referring Sachiko Methot: Referral, Self Treating Aliannah Holstrom/Extender: Melburn Hake, [name]: 0 Primary Learner Assessed: Patient Learning Preferences/Education Level/Primary Language Learning Preference: Explanation, Demonstration Highest Education Level: College or Above Preferred Language: English Cognitive Barrier Language Barrier: No Translator Needed: No Memory Deficit: No Emotional Barrier: No Cultural/Religious Beliefs Affecting Medical Care: No Physical Barrier Impaired Vision: No Impaired Hearing: No Decreased Hand dexterity: No Knowledge/Comprehension Knowledge Level: Medium Comprehension Level: Medium Ability to understand written Medium instructions: Ability to understand verbal Medium instructions: Motivation Anxiety Level: Calm Cooperation: Cooperative Education Importance: Acknowledges Need Interest in Health Problems: Asks Questions Perception: Coherent Willingness to Engage in Self- Medium Management Activities: Readiness to Engage in Self- Medium Management Activities: Electronic Signature(s) Signed: 02/03/2019 3:25:11 PM By: Montey Hora Entered By: Montey Hora on 02/03/2019 14:53:56 Yellin, [name] ([id number]) -------------------------------------------------------------------------------- Fall Risk Assessment Details Patient Name: [name]. Date of Service: 02/03/2019 2:15 PM Medical Record Number: [id number] Patient Account Number: [id number] Date of  Birth/Sex: 1931/01/27 (85 y.o. M) Treating RN: Montey Hora Primary Care Kambri Dismore: Caryl Pina Other Clinician: Referring Alyzabeth Pontillo: Referral, Self Treating Siah Kannan/Extender: Melburn Hake, [name]: 0 Fall Risk Assessment Items Have you had 2 or more falls in the last 12 monthso 0 No Have you had any fall that resulted in injury in the last 12 monthso 0 No FALLS RISK SCREEN History of falling - immediate or within 3 months 0 No Secondary diagnosis (Do you have 2 or more medical diagnoseso) 0 No Ambulatory aid None/bed rest/wheelchair/nurse 0 Yes Crutches/cane/walker 0 No Furniture 0 No Intravenous therapy Access/Saline/Heparin Lock 0 No Gait/Transferring Normal/ bed rest/ wheelchair 0 No  Weak (short steps with or without shuffle, stooped but able to lift head while 10 Yes walking, may seek support from furniture) Impaired (short steps with shuffle, may have difficulty arising from chair, head 0 No down, impaired balance) Mental Status Oriented to own ability 0 Yes Electronic Signature(s) Signed: 02/03/2019 3:25:11 PM By: Montey Hora Entered By: Montey Hora on 02/03/2019 14:54:37 Rochford, [name] ([id number]) -------------------------------------------------------------------------------- Foot Assessment Details Patient Name: [name], [name]. Date of Service: 02/03/2019 2:15 PM Medical Record Number: [id number] Patient Account Number: [id number] Date of Birth/Sex: 1930-07-11 (85 y.o. M) Treating RN: Montey Hora Primary Care Karinne Schmader: Caryl Pina Other Clinician: Referring Kawanna Christley: Referral, Self Treating Maty Zeisler/Extender: STONE III, [name]: 0 Foot Assessment Items Site Locations + = Sensation present, - = Sensation absent, C = Callus, U = Ulcer R = Redness, W = Warmth, M = Maceration, PU = Pre-ulcerative lesion F = Fissure, S = Swelling, D = Dryness Assessment Right: Left: Other Deformity: No No Prior Foot Ulcer: No  No Prior Amputation: No No Charcot Joint: No No Ambulatory Status: Non-ambulatory Assistance Device: Wheelchair GaitEnergy manager) Signed: 02/03/2019 3:25:11 PM By: Montey Hora Entered By: Montey Hora on 02/03/2019 14:58:37 Podolski, [name] ([id number]) -------------------------------------------------------------------------------- Nutrition Risk Screening Details Patient Name: [name]. Date of Service: 02/03/2019 2:15 PM Medical Record Number: [id number] Patient Account Number: [id number] Date of Birth/Sex: 05-Feb-1931 (85 y.o. M) Treating RN: Montey Hora Primary Care Zaire Vanbuskirk: Caryl Pina Other Clinician: Referring Elaynah Virginia: Referral, Self Treating Benjamim Harnish/Extender: STONE III, [name]: 0 Height (in): 68 Weight (lbs): 235 Body Mass Index (BMI): 35.7 Nutrition Risk Screening Items Score Screening NUTRITION RISK SCREEN: I have an illness or condition that made me change the kind and/or amount of 0 No food I eat I eat fewer than two meals per day 0 No I eat few fruits and vegetables, or milk products 0 No I have three or more drinks of beer, liquor or wine almost every day 0 No I have tooth or mouth problems that make it hard for me to eat 0 No I don't always have enough money to buy the food I need 0 No I eat alone most of the time 0 No I take three or more different prescribed or over-the-counter drugs a day 1 Yes Without wanting to, I have lost or gained 10 pounds in the last six months 0 No I am not always physically able to shop, cook and/or feed myself 0 No Nutrition Protocols Good Risk Protocol 0 No interventions needed Moderate Risk Protocol High Risk Proctocol Risk Level: Good Risk Score: 1 Electronic Signature(s) Signed: 02/03/2019 3:25:11 PM By: Montey Hora Entered By: Montey Hora on 02/03/2019 14:58:12

## 2019-02-04 ENCOUNTER — Other Ambulatory Visit
Admission: RE | Admit: 2019-02-04 | Discharge: 2019-02-04 | Disposition: A | Discharge status: Home / Self Care | Payer: Medicare Other | Source: Ambulatory Visit | Attending: Physician Assistant | Admitting: Physician Assistant

## 2019-02-04 DIAGNOSIS — L089 Local infection of the skin and subcutaneous tissue, unspecified: Secondary | ICD-10-CM | POA: Insufficient documentation

## 2019-02-04 NOTE — Progress Notes (Signed)
[name], [name] ([id number]) Visit Report for 02/03/2019 Chief Complaint Document Details Patient Name: [name], [name]. Date of Service: 02/03/2019 2:15 PM Medical Record Number: [id number] Patient Account Number: [id number] Date of Birth/Sex: 30-May-1930 (85 y.o. M) Treating RN: Army Melia Primary Care Provider: Caryl Pina Other Clinician: Referring Provider: Referral, Self Treating Provider/Extender: STONE III, Blayden Conwell Weeks in Treatment: 0 Information Obtained from: Patient Chief Complaint Heel pressure ulcer and right 4th and 5th toe pressure ulcers Electronic Signature(s) Signed: 02/03/2019 3:18:13 PM By: Worthy Keeler PA-C Entered By: Worthy Keeler on 02/03/2019 15:18:13 Goethe, [name] ([id number]) -------------------------------------------------------------------------------- Debridement Details Patient Name: [name], [name]. Date of Service: 02/03/2019 2:15 PM Medical Record Number: [id number] Patient Account Number: [id number] Date of Birth/Sex: Dec 08, 1930 (85 y.o. M) Treating RN: Army Melia Primary Care Provider: Caryl Pina Other Clinician: Referring Provider: Referral, Self Treating Provider/Extender: STONE III, Carlea [name] Weeks in Treatment: 0 Debridement Performed for Wound #1 Right Calcaneus Assessment: Performed By: Physician STONE III, Madisyn Mawhinney E., PA-C Debridement Type: Debridement Level of Consciousness (Pre- Awake and Alert procedure): Pre-procedure Verification/Time Yes - 15:36 Out Taken: Start Time: 15:36 Pain Control: Lidocaine Total Area Debrided (L x W): 2.8 (cm) x 3.2 (cm) = 8.96 (cm) Tissue and other material Viable, Non-Viable, Slough, Subcutaneous, Slough debrided: Level: Skin/Subcutaneous Tissue Debridement Description: Excisional Instrument: Forceps, Scissors Bleeding: Minimum Hemostasis Achieved: Pressure End Time: 15:37 Response to Treatment: Procedure was tolerated well Level of Consciousness Awake and  Alert (Post-procedure): Post Debridement Measurements of Total Wound Length: (cm) 2.8 Stage: Unstageable/Unclassified Width: (cm) 3.2 Depth: (cm) 0.5 Volume: (cm) 3.519 Character of Wound/Ulcer Post Stable Debridement: Post Procedure Diagnosis Same as Pre-procedure Electronic Signature(s) Signed: 02/03/2019 4:29:54 PM By: Army Melia Signed: 02/03/2019 6:18:38 PM By: Worthy Keeler PA-C Entered By: Army Melia on 02/03/2019 15:37:01 Beckner, [name] ([id number]) -------------------------------------------------------------------------------- HPI Details Patient Name: [name], [name]. Date of Service: 02/03/2019 2:15 PM Medical Record Number: [id number] Patient Account Number: [id number] Date of Birth/Sex: 1930/03/14 (85 y.o. M) Treating RN: Army Melia Primary Care Provider: Caryl Pina Other Clinician: Referring Provider: Referral, Self Treating Provider/Extender: Melburn Hake, Prarthana Parlin Weeks in Treatment: 0 History of Present Illness HPI Description: 02/03/2019 patient presents today for initial evaluation in our clinic concerning issues that he has been having with wounds on his right heel as well as the right fourth and fifth toes. The toe ulcers have been present for about 5 months. They are pretty much just dry and eschar covered at this point but appear to be stable. The heel ulcer has been present for about 2 months and is very soft the eschar is loosening and there appears to be purulent drainage as well noted at this point there is also erythema surrounding the wound bed. I am unsure if this is just inflammation or secondary to infection to be perfectly honest. With that being said the patient has recently gotten Prevalon offloading boots and he is essentially nonambulatory at this point. He did have a coronary artery bypass graft x4 in 1989. Otherwise he tells me that he also has hypertension and urinary incontinence. Obviously that is not playing any role in his  wounds currently based on what we are seeing today. Electronic Signature(s) Signed: 02/03/2019 4:51:50 PM By: Worthy Keeler PA-C Entered By: Worthy Keeler on 02/03/2019 16:51:49 Snelson, [name] ([id number]) -------------------------------------------------------------------------------- Physical Exam Details Patient Name: [name], [name]. Date of Service: 02/03/2019 2:15 PM Medical Record Number: [id number] Patient Account Number: [id number] Date of Birth/Sex: 02-08-1931 (85 y.o. M)  Treating RN: Army Melia Primary Care Provider: Caryl Pina Other Clinician: Referring Provider: Referral, Self Treating Provider/Extender: STONE III, Keyairra Kolinski Weeks in Treatment: 0 Constitutional sitting or standing blood pressure is within target range for patient.. pulse regular and within target range for patient.Marland Kitchen respirations regular, non-labored and within target range for patient.Marland Kitchen temperature within target range for patient.. Well- nourished and well-hydrated in no acute distress. Eyes conjunctiva clear no eyelid edema noted. pupils equal round and reactive to light and accommodation. Ears, Nose, Mouth, and Throat no gross abnormality of ear auricles or external auditory canals. normal hearing noted during conversation. mucus membranes moist. Respiratory normal breathing without difficulty. clear to auscultation bilaterally. Cardiovascular regular rate and rhythm with normal S1, S2. 1+ dorsalis pedis/posterior tibialis pulses. 1+ pitting edema of the bilateral lower extremities. Gastrointestinal (GI) soft, non-tender, non-distended, +BS. no ventral hernia noted. Musculoskeletal Patient unable to walk without assistance. no significant deformity or arthritic changes, no loss or range of motion, no clubbing. Psychiatric this patient is able to make decisions and demonstrates good insight into disease process. Alert and Oriented x 3. pleasant and cooperative. Notes Upon inspection  patient's wound on the heel actually did require some sharp debridement. I was not extremely aggressive with debridement based on the fact that he was noncompressible with regard to his ABIs. For that reason I do believe that he needs to see vascular in order to evaluate for arterial insufficiency. If he has sufficient blood flow we may have to perform more aggressive debridement in the future. He voiced understanding of this as well. He does have some edema in the lower extremities at this time. I did obtain a wound culture post debridement today which depending on the results of we will initiate therapy as needed. Electronic Signature(s) Signed: 02/03/2019 4:52:50 PM By: Worthy Keeler PA-C Entered By: Worthy Keeler on 02/03/2019 16:52:50 Marsteller, [name] ([id number]) -------------------------------------------------------------------------------- Physician Orders Details Patient Name: [name], [name]. Date of Service: 02/03/2019 2:15 PM Medical Record Number: [id number] Patient Account Number: [id number] Date of Birth/Sex: 1930/06/07 (85 y.o. M) Treating RN: Army Melia Primary Care Provider: Caryl Pina Other Clinician: Referring Provider: Referral, Self Treating Provider/Extender: STONE III, Shaymus Eveleth Weeks in Treatment: 0 Verbal / Phone Orders: No Diagnosis Coding ICD-10 Coding Code Description L89.610 Pressure ulcer of right heel, unstageable L89.890 Pressure ulcer of other site, unstageable I10 Essential (primary) hypertension I25.10 Atherosclerotic heart disease of native coronary artery without angina pectoris N39.498 Other specified urinary incontinence Wound Cleansing Wound #1 Right Calcaneus o Clean wound with Normal Saline. - in office o Cleanse wound with mild soap and water Wound #2 Right Toe Fourth o Clean wound with Normal Saline. - in office o Cleanse wound with mild soap and water Wound #3 Right Toe Fifth o Clean wound with Normal Saline. - in  office o Cleanse wound with mild soap and water Skin Barriers/Peri-Wound Care Wound #2 Right Toe Fourth o Other: - Betadine Wound #3 Right Toe Fifth o Other: - Betadine Primary Wound Dressing Wound #1 Right Calcaneus o Iodoflex Secondary Dressing Wound #1 Right Calcaneus o ABD pad o Conform/Kerlix o Other - heel cup Dressing Change Frequency Wound #1 Right Calcaneus o Change Dressing Tuesday and Thursday. - Thursday in office CHRISTON, PARADA ([id number]) Wound #2 Right Toe Fourth o Change Dressing Tuesday and Thursday. - Thursday in office Wound #3 Right Toe Fifth o Change Dressing Tuesday and Thursday. - Thursday in office Follow-up Appointments Wound #1 Right Calcaneus o Return Appointment in 1  week. Wound #2 Right Toe Fourth o Return Appointment in 1 week. Wound #3 Right Toe Fifth o Return Appointment in 1 week. Home Health Wound #1 Right [place] Visits - [place] Nurse may visit PRN to address patientos wound care needs. o FACE TO FACE ENCOUNTER: MEDICARE and MEDICAID PATIENTS: I certify that this patient is under my care and that I had a face-to-face encounter that meets the physician face-to-face encounter requirements with this patient on this date. The encounter with the patient was in whole or in part for the following MEDICAL CONDITION: (primary reason for [place]) MEDICAL NECESSITY: I certify, that based on my findings, NURSING services are a medically necessary home health service. HOME BOUND STATUS: I certify that my clinical findings support that this patient is homebound (i.e., Due to illness or injury, pt requires aid of supportive devices such as crutches, cane, wheelchairs, walkers, the use of special transportation or the assistance of another person to leave their place of residence. There is a normal inability to leave the home and doing so requires considerable and taxing  effort. Other absences are for medical reasons / religious services and are infrequent or of short duration when for other reasons). o If current dressing causes regression in wound condition, may D/C ordered dressing product/s and apply Normal Saline Moist Dressing daily until next [place] / Other MD appointment. [place] of regression in wound condition at [phone]. o Please direct any NON-WOUND related issues/requests for orders to patient's Primary Care Physician Wound #2 Right Toe [place] Visits - [place] Nurse may visit PRN to address patientos wound care needs. o FACE TO FACE ENCOUNTER: MEDICARE and MEDICAID PATIENTS: I certify that this patient is under my care and that I had a face-to-face encounter that meets the physician face-to-face encounter requirements with this patient on this date. The encounter with the patient was in whole or in part for the following MEDICAL CONDITION: (primary reason for [place]) MEDICAL NECESSITY: I certify, that based on my findings, NURSING services are a medically necessary home health service. HOME BOUND STATUS: I certify that my clinical findings support that this patient is homebound (i.e., Due to illness or injury, pt requires aid of supportive devices such as crutches, cane, wheelchairs, walkers, the use of special transportation or the assistance of another person to leave their place of residence. There is a normal inability to leave the home and doing so requires considerable and taxing effort. Other absences are for medical reasons / religious services and are infrequent or of short duration when for other reasons). o If current dressing causes regression in wound condition, may D/C ordered dressing product/s and apply Normal Saline Moist Dressing daily until next [place] / Other MD appointment. [place] of regression in  wound condition at [phone]. o Please direct any NON-WOUND related issues/requests for orders to patient's Primary Care Physician Wound #3 Right Toe [place] Visits - [place] Nurse may visit PRN to address patientos wound care needs. DVANTE, HANDS ([id number]) o FACE TO FACE ENCOUNTER: MEDICARE and MEDICAID PATIENTS: I certify that this patient is under my care and that I had a face-to-face encounter that meets the physician face-to-face encounter requirements with this patient on this date. The encounter with the patient was in whole or in part for the following MEDICAL CONDITION: (primary reason for  Home Healthcare) MEDICAL NECESSITY: I certify, that based on my findings, NURSING services are a medically necessary home health service. HOME BOUND STATUS: I certify that my clinical findings support that this patient is homebound (i.e., Due to illness or injury, pt requires aid of supportive devices such as crutches, cane, wheelchairs, walkers, the use of special transportation or the assistance of another person to leave their place of residence. There is a normal inability to leave the home and doing so requires considerable and taxing effort. Other absences are for medical reasons / religious services and are infrequent or of short duration when for other reasons). o If current dressing causes regression in wound condition, may D/C ordered dressing product/s and apply Normal Saline Moist Dressing daily until next [place] / Other MD appointment. [place] of regression in wound condition at [phone]. o Please direct any NON-WOUND related issues/requests for orders to patient's Primary Care Physician Consults o Vascular - ABI TBI Laboratory o Bacteria identified in Wound by Culture (MICRO) - Right foot oooo LOINC Code: 7035-0 oooo Convenience Name: Wound culture routine Electronic  Signature(s) Signed: 02/03/2019 4:29:54 PM By: Army Melia Signed: 02/03/2019 6:18:38 PM By: Worthy Keeler PA-C Entered By: Army Melia on 02/03/2019 15:48:32 Mayr, [name] ([id number]) -------------------------------------------------------------------------------- Problem List Details Patient Name: NYSIR, FERGUSSON. Date of Service: 02/03/2019 2:15 PM Medical Record Number: [id number] Patient Account Number: [id number] Date of Birth/Sex: Apr 28, 1930 (85 y.o. M) Treating RN: Army Melia Primary Care Provider: Caryl Pina Other Clinician: Referring Provider: Referral, Self Treating Provider/Extender: Melburn Hake, Tariana Moldovan Weeks in Treatment: 0 Active Problems ICD-10 Evaluated Encounter Code Description Active Date Today Diagnosis L89.610 Pressure ulcer of right heel, unstageable 02/03/2019 No Yes L89.890 Pressure ulcer of other site, unstageable 02/03/2019 No Yes I10 Essential (primary) hypertension 02/03/2019 No Yes I25.10 Atherosclerotic heart disease of native coronary artery 02/03/2019 No Yes without angina pectoris N39.498 Other specified urinary incontinence 02/03/2019 No Yes Inactive Problems Resolved Problems Electronic Signature(s) Signed: 02/03/2019 3:17:02 PM By: Worthy Keeler PA-C Entered By: Worthy Keeler on 02/03/2019 15:17:02 Titus, [name] ([id number]) -------------------------------------------------------------------------------- Progress Note Details Patient Name: [name]. Date of Service: 02/03/2019 2:15 PM Medical Record Number: [id number] Patient Account Number: [id number] Date of Birth/Sex: February 26, 1930 (85 y.o. M) Treating RN: Army Melia Primary Care Provider: Caryl Pina Other Clinician: Referring Provider: Referral, Self Treating Provider/Extender: Melburn Hake, Nika Yazzie Weeks in Treatment: 0 Subjective Chief Complaint Information obtained from Patient Heel pressure ulcer and right 4th and 5th toe pressure ulcers History of  Present Illness (HPI) 02/03/2019 patient presents today for initial evaluation in our clinic concerning issues that he has been having with wounds on his right heel as well as the right fourth and fifth toes. The toe ulcers have been present for about 5 months. They are pretty much just dry and eschar covered at this point but appear to be stable. The heel ulcer has been present for about 2 months and is very soft the eschar is loosening and there appears to be purulent drainage as well noted at this point there is also erythema surrounding the wound bed. I am unsure if this is just inflammation or secondary to infection to be perfectly honest. With that being said the patient has recently gotten Prevalon offloading boots and he is essentially nonambulatory at this point. He did have a coronary artery bypass graft x4 in 1989. Otherwise he tells me that he also has hypertension and urinary incontinence. Obviously that is not  playing any role in his wounds currently based on what we are seeing today. Patient History Information obtained from Patient. Allergies Sulfa (Sulfonamide Antibiotics) (Reaction: rash), aspirin (Reaction: rash with higher dose than 81mg ) Family History Cancer - Siblings, Heart Disease - Mother,Father, Hypertension - Mother,Father, No family history of Diabetes, Hereditary Spherocytosis, Kidney Disease, Lung Disease, Seizures, Stroke, Thyroid Problems, Tuberculosis. Social History Former smoker - 72 years, Marital Status - Married, Alcohol Use - Never, Drug Use - No History, Caffeine Use - Daily. Medical History Eyes Patient has history of Cataracts - removed Denies history of Glaucoma, Optic Neuritis Cardiovascular Patient has history of Hypertension, Myocardial Infarction Denies history of Angina, Arrhythmia, Congestive Heart Failure, Coronary Artery Disease, Deep Vein Thrombosis, Hypotension, Peripheral Arterial Disease, Peripheral Venous Disease, Phlebitis,  Vasculitis Endocrine Patient has history of Type II Diabetes Denies history of Type I Diabetes Genitourinary Denies history of End Stage Renal Disease Integumentary (Skin) Denies history of History of Burn, History of pressure wounds Musculoskeletal KALIB, BHAGAT ([id number]) Patient has history of Osteoarthritis Denies history of Gout, Rheumatoid Arthritis, Osteomyelitis Neurologic Patient has history of Neuropathy Denies history of Dementia, Quadriplegia, Paraplegia, Seizure Disorder Patient is treated with Oral Agents. Blood sugar is tested. Medical And Surgical History Notes Eyes macular degeneration Cardiovascular CABG x4 1989 Review of Systems (ROS) Constitutional Symptoms (General Health) Denies complaints or symptoms of Fatigue, Fever, Chills, Marked Weight Change. Eyes Denies complaints or symptoms of Dry Eyes, Vision Changes, Glasses / Contacts. Ear/Nose/Mouth/Throat Denies complaints or symptoms of Difficult clearing ears, Sinusitis. Hematologic/Lymphatic Denies complaints or symptoms of Bleeding / Clotting Disorders, Human Immunodeficiency Virus. Respiratory Denies complaints or symptoms of Chronic or frequent coughs, Shortness of Breath. Cardiovascular Denies complaints or symptoms of Chest pain, LE edema. Gastrointestinal Denies complaints or symptoms of Frequent diarrhea, Nausea, Vomiting. Endocrine Denies complaints or symptoms of Hepatitis, Thyroid disease, Polydypsia (Excessive Thirst). Genitourinary Complains or has symptoms of Incontinence/dribbling. Denies complaints or symptoms of Kidney failure/ Dialysis. Immunological Denies complaints or symptoms of Hives, Itching. Integumentary (Skin) Complains or has symptoms of Wounds. Denies complaints or symptoms of Bleeding or bruising tendency, Breakdown, Swelling. Musculoskeletal Denies complaints or symptoms of Muscle Pain, Muscle Weakness. Neurologic Denies complaints or symptoms of  Numbness/parasthesias, Focal/Weakness. Psychiatric Denies complaints or symptoms of Anxiety, Claustrophobia. Objective Constitutional sitting or standing blood pressure is within target range for patient.. pulse regular and within target range for patient.Marland Kitchen respirations regular, non-labored and within target range for patient.Marland Kitchen temperature within target range for patient.. Well- ESTIVEN, KOHAN ([id number]) nourished and well-hydrated in no acute distress. Vitals Time Taken: 2:42 PM, Height: 68 in, Source: Stated, Weight: 235 lbs, Source: Stated, BMI: 35.7, Temperature: 97.6 F, Pulse: 92 bpm, Respiratory Rate: 16 breaths/min, Blood Pressure: 126/98 mmHg. Eyes conjunctiva clear no eyelid edema noted. pupils equal round and reactive to light and accommodation. Ears, Nose, Mouth, and Throat no gross abnormality of ear auricles or external auditory canals. normal hearing noted during conversation. mucus membranes moist. Respiratory normal breathing without difficulty. clear to auscultation bilaterally. Cardiovascular regular rate and rhythm with normal S1, S2. 1+ dorsalis pedis/posterior tibialis pulses. 1+ pitting edema of the bilateral lower extremities. Gastrointestinal (GI) soft, non-tender, non-distended, +BS. no ventral hernia noted. Musculoskeletal Patient unable to walk without assistance. no significant deformity or arthritic changes, no loss or range of motion, no clubbing. Psychiatric this patient is able to make decisions and demonstrates good insight into disease process. Alert and Oriented x 3. pleasant and cooperative. General Notes: Upon inspection patient's wound on  the heel actually did require some sharp debridement. I was not extremely aggressive with debridement based on the fact that he was noncompressible with regard to his ABIs. For that reason I do believe that he needs to see vascular in order to evaluate for arterial insufficiency. If he has sufficient blood  flow we may have to perform more aggressive debridement in the future. He voiced understanding of this as well. He does have some edema in the lower extremities at this time. I did obtain a wound culture post debridement today which depending on the results of we will initiate therapy as needed. Integumentary (Hair, Skin) Wound #1 status is Open. Original cause of wound was Gradually Appeared. The wound is located on the Right Calcaneus. The wound measures 2.8cm length x 3.2cm width x 0.5cm depth; 7.037cm^2 area and 3.519cm^3 volume. There is Fat Layer (Subcutaneous Tissue) Exposed exposed. There is no tunneling or undermining noted. There is a medium amount of serous drainage noted. The wound margin is flat and intact. There is no granulation within the wound bed. There is a large (67-100%) amount of necrotic tissue within the wound bed. Wound #2 status is Open. Original cause of wound was Gradually Appeared. The wound is located on the Right Toe Fourth. The wound measures 0.3cm length x 0.4cm width x 0.1cm depth; 0.094cm^2 area and 0.009cm^3 volume. There is Fat Layer (Subcutaneous Tissue) Exposed exposed. There is no tunneling or undermining noted. There is a medium amount of serous drainage noted. The wound margin is flat and intact. There is no granulation within the wound bed. There is a large (67-100%) amount of necrotic tissue within the wound bed including Eschar. Wound #3 status is Open. Original cause of wound was Pressure Injury. The wound is located on the Right Toe Fifth. The wound measures 1cm length x 0.4cm width x 0.1cm depth; 0.314cm^2 area and 0.031cm^3 volume. There is Fat Layer (Subcutaneous Tissue) Exposed exposed. There is no tunneling or undermining noted. There is a medium amount of serous drainage noted. The wound margin is flat and intact. There is no granulation within the wound bed. There is a large (67-100%) amount of necrotic tissue within the wound bed including  Eschar. WENDELIN, BRADT ([id number]) Assessment Active Problems ICD-10 Pressure ulcer of right heel, unstageable Pressure ulcer of other site, unstageable Essential (primary) hypertension Atherosclerotic heart disease of native coronary artery without angina pectoris Other specified urinary incontinence Procedures Wound #1 Pre-procedure diagnosis of Wound #1 is a Pressure Ulcer located on the Right Calcaneus . There was a Excisional Skin/Subcutaneous Tissue Debridement with a total area of 8.96 sq cm performed by STONE III, Raylinn Kosar E., PA-C. With the following instrument(s): Forceps, and Scissors to remove Viable and Non-Viable tissue/material. Material removed includes Subcutaneous Tissue and Slough and after achieving pain control using Lidocaine. A time out was conducted at 15:36, prior to the start of the procedure. A Minimum amount of bleeding was controlled with Pressure. The procedure was tolerated well. Post Debridement Measurements: 2.8cm length x 3.2cm width x 0.5cm depth; 3.519cm^3 volume. Post debridement Stage noted as Unstageable/Unclassified. Character of Wound/Ulcer Post Debridement is stable. Post procedure Diagnosis Wound #1: Same as Pre-Procedure Plan Wound Cleansing: Wound #1 Right Calcaneus: Clean wound with Normal Saline. - in office Cleanse wound with mild soap and water Wound #2 Right Toe Fourth: Clean wound with Normal Saline. - in office Cleanse wound with mild soap and water Wound #3 Right Toe Fifth: Clean wound with Normal Saline. - in office  Cleanse wound with mild soap and water Skin Barriers/Peri-Wound Care: Wound #2 Right Toe Fourth: Other: - Betadine Wound #3 Right Toe Fifth: Other: - Betadine Primary Wound Dressing: Wound #1 Right Calcaneus: Iodoflex Secondary Dressing: Wound #1 Right Calcaneus: HERBY, AMICK ([id number]) ABD pad Conform/Kerlix Other - heel cup Dressing Change Frequency: Wound #1 Right Calcaneus: Change Dressing  Tuesday and Thursday. - Thursday in office Wound #2 Right Toe Fourth: Change Dressing Tuesday and Thursday. - Thursday in office Wound #3 Right Toe Fifth: Change Dressing Tuesday and Thursday. - Thursday in office Follow-up Appointments: Wound #1 Right Calcaneus: Return Appointment in 1 week. Wound #2 Right Toe Fourth: Return Appointment in 1 week. Wound #3 Right Toe Fifth: Return Appointment in 1 week. Home Health: Wound #1 Right Calcaneus: Continue Home Health Visits - [place] Nurse may visit PRN to address patient s wound care needs. FACE TO FACE ENCOUNTER: MEDICARE and MEDICAID PATIENTS: I certify that this patient is under my care and that I had a face-to-face encounter that meets the physician face-to-face encounter requirements with this patient on this date. The encounter with the patient was in whole or in part for the following MEDICAL CONDITION: (primary reason for [place]) MEDICAL NECESSITY: I certify, that based on my findings, NURSING services are a medically necessary home health service. HOME BOUND STATUS: I certify that my clinical findings support that this patient is homebound (i.e., Due to illness or injury, pt requires aid of supportive devices such as crutches, cane, wheelchairs, walkers, the use of special transportation or the assistance of another person to leave their place of residence. There is a normal inability to leave the home and doing so requires considerable and taxing effort. Other absences are for medical reasons / religious services and are infrequent or of short duration when for other reasons). If current dressing causes regression in wound condition, may D/C ordered dressing product/s and apply Normal Saline Moist Dressing daily until next [place] / Other MD appointment. [place] of regression in wound condition at [phone]. Please direct any NON-WOUND related issues/requests for orders to  patient's Primary Care Physician Wound #2 Right Toe Fourth: [place] Visits - [place] Nurse may visit PRN to address patient s wound care needs. FACE TO FACE ENCOUNTER: MEDICARE and MEDICAID PATIENTS: I certify that this patient is under my care and that I had a face-to-face encounter that meets the physician face-to-face encounter requirements with this patient on this date. The encounter with the patient was in whole or in part for the following MEDICAL CONDITION: (primary reason for [place]) MEDICAL NECESSITY: I certify, that based on my findings, NURSING services are a medically necessary home health service. HOME BOUND STATUS: I certify that my clinical findings support that this patient is homebound (i.e., Due to illness or injury, pt requires aid of supportive devices such as crutches, cane, wheelchairs, walkers, the use of special transportation or the assistance of another person to leave their place of residence. There is a normal inability to leave the home and doing so requires considerable and taxing effort. Other absences are for medical reasons / religious services and are infrequent or of short duration when for other reasons). If current dressing causes regression in wound condition, may D/C ordered dressing product/s and apply Normal Saline Moist Dressing daily until next [place] / Other MD appointment. [place] of regression in wound condition at [phone]. Please direct any NON-WOUND  related issues/requests for orders to patient's Primary Care Physician Wound #3 Right Toe Fifth: [place] Visits - [place] Nurse may visit PRN to address patient s wound care needs. FACE TO FACE ENCOUNTER: MEDICARE and MEDICAID PATIENTS: I certify that this patient is under my care and that I had a face-to-face encounter that meets the physician face-to-face encounter requirements with this patient on  this date. The encounter with the patient was in whole or in part for the following MEDICAL CONDITION: (primary reason for [place]) MEDICAL NECESSITY: I certify, that based on my findings, NURSING services are a medically necessary home health service. HOME BOUND STATUS: I certify that my clinical findings support that this patient is homebound (i.e., Due to illness or injury, pt requires aid of supportive devices such as crutches, cane, wheelchairs, walkers, the use of special transportation or the assistance of another person to leave their place of residence. There is a normal inability to leave the [name], HANWAY. ([id number]) home and doing so requires considerable and taxing effort. Other absences are for medical reasons / religious services and are infrequent or of short duration when for other reasons). If current dressing causes regression in wound condition, may D/C ordered dressing product/s and apply Normal Saline Moist Dressing daily until next [place] / Other MD appointment. [place] of regression in wound condition at [phone]. Please direct any NON-WOUND related issues/requests for orders to patient's Primary Care Physician Laboratory ordered were: Wound culture routine - Right foot Consults ordered were: Vascular - ABI TBI 1. My suggestion at this time is can be that we go ahead and initiate treatment with a Iodoflex dressing for the heel which I think will help to debride away some of the necrotic tissue here. The patient is in agreement with that plan. 2. I am also going to suggest that based on what we are seeing with regard to his toes that Betadine topical painted daily would probably be the best option at this point. He voiced understanding in that regard as well. 3. With regard offloading I think that the Prevalon offloading boots would not be ideal obviously repositioning is also key. As much as possible I want him to keep  his heels from touching on the bed when he sleeping the Prevalon offloading boots will be ideal for this. 4. Depending on the results of the wound culture I will make adjustments in his therapy as needed adding a antibiotic if we need to. We will see patient back for reevaluation in 1 week here in the clinic. If anything worsens or changes patient will contact our office for additional recommendations. Electronic Signature(s) Signed: 02/03/2019 4:53:58 PM By: Worthy Keeler PA-C Entered By: Worthy Keeler on 02/03/2019 16:53:58 [name], [name] ([id number]) -------------------------------------------------------------------------------- ROS/PFSH Details Patient Name: ALEZANDER, DIMAANO. Date of Service: 02/03/2019 2:15 PM Medical Record Number: [id number] Patient Account Number: [id number] Date of Birth/Sex: [birth year] (85 y.o. M) Treating RN: Montey Hora Primary Care Provider: Caryl Pina Other Clinician: Referring Provider: Referral, Self Treating Provider/Extender: STONE III, Wendell Nicoson Weeks in Treatment: 0 Information Obtained From Patient Constitutional Symptoms (General Health) Complaints and Symptoms: Negative for: Fatigue; Fever; Chills; Marked Weight Change Eyes Complaints and Symptoms: Negative for: Dry Eyes; Vision Changes; Glasses / Contacts Medical History: Positive for: Cataracts - removed Negative for: Glaucoma; Optic Neuritis Past Medical History Notes: macular degeneration Ear/Nose/Mouth/Throat Complaints and Symptoms: Negative for: Difficult clearing ears; Sinusitis Hematologic/Lymphatic Complaints and Symptoms: Negative for: Bleeding /  Clotting Disorders; Human Immunodeficiency Virus Respiratory Complaints and Symptoms: Negative for: Chronic or frequent coughs; Shortness of Breath Cardiovascular Complaints and Symptoms: Negative for: Chest pain; LE edema Medical History: Positive for: Hypertension; Myocardial Infarction Negative for: Angina;  Arrhythmia; Congestive Heart Failure; Coronary Artery Disease; Deep Vein Thrombosis; Hypotension; Peripheral Arterial Disease; Peripheral Venous Disease; Phlebitis; Vasculitis Past Medical History Notes: CABG x4 1989 Gastrointestinal Complaints and Symptoms: Negative for: Frequent diarrhea; Nausea; Vomiting Ho, Bobby J. ([id number]) Endocrine Complaints and Symptoms: Negative for: Hepatitis; Thyroid disease; Polydypsia (Excessive Thirst) Medical History: Positive for: Type II Diabetes Negative for: Type I Diabetes Time with diabetes: 40 years Treated with: Oral agents Blood sugar tested every day: Yes Tested : Genitourinary Complaints and Symptoms: Positive for: Incontinence/dribbling Negative for: Kidney failure/ Dialysis Medical History: Negative for: End Stage Renal Disease Immunological Complaints and Symptoms: Negative for: Hives; Itching Integumentary (Skin) Complaints and Symptoms: Positive for: Wounds Negative for: Bleeding or bruising tendency; Breakdown; Swelling Medical History: Negative for: History of Burn; History of pressure wounds Musculoskeletal Complaints and Symptoms: Negative for: Muscle Pain; Muscle Weakness Medical History: Positive for: Osteoarthritis Negative for: Gout; Rheumatoid Arthritis; Osteomyelitis Neurologic Complaints and Symptoms: Negative for: Numbness/parasthesias; Focal/Weakness Medical History: Positive for: Neuropathy Negative for: Dementia; Quadriplegia; Paraplegia; Seizure Disorder Psychiatric Complaints and Symptoms: Negative for: Anxiety; Claustrophobia Oncologic [name], [name] ([id number]) [name] Eyes: Cataracts Immunizations Pneumococcal Vaccine: Received Pneumococcal Vaccination: Yes Immunization Notes: up to date Implantable Devices None Family and Social History Cancer: Yes - Siblings; Diabetes: No; Heart Disease: Yes - Mother,Father; Hereditary Spherocytosis: No; Hypertension:  Yes - Mother,Father; Kidney Disease: No; Lung Disease: No; Seizures: No; Stroke: No; Thyroid Problems: No; Tuberculosis: No; Former smoker - 10 years; Marital Status - Married; Alcohol Use: Never; Drug Use: No History; Caffeine Use: Daily; Financial Concerns: No; Food, Clothing or Shelter Needs: No; Support System Lacking: No; Transportation Concerns: No Electronic Signature(s) Signed: 02/03/2019 3:25:11 PM By: Montey Hora Signed: 02/03/2019 6:18:38 PM By: Worthy Keeler PA-C Entered By: Montey Hora on 02/03/2019 15:04:22 Milberger, [name] ([id number]) -------------------------------------------------------------------------------- SuperBill Details Patient Name: DYSHAWN, CANGELOSI. Date of Service: 02/03/2019 Medical Record Number: [id number] Patient Account Number: [id number] Date of Birth/Sex: Oct 25, 1930 (85 y.o. M) Treating RN: Army Melia Primary Care Provider: Caryl Pina Other Clinician: Referring Provider: Referral, Self Treating Provider/Extender: STONE III, Binh Doten Weeks in Treatment: 0 Diagnosis Coding ICD-10 Codes Code Description L89.610 Pressure ulcer of right heel, unstageable L89.890 Pressure ulcer of other site, unstageable I10 Essential (primary) hypertension I25.10 Atherosclerotic heart disease of native coronary artery without angina pectoris N39.498 Other specified urinary incontinence Facility Procedures CPT4 Code: [id number] Description: [place] VISIT-LEV 3 EST PT Modifier: Quantity: 1 CPT4 Code: [id number] Description: 11042 - DEB SUBQ TISSUE 20 SQ CM/< ICD-10 Diagnosis Description L89.610 Pressure ulcer of right heel, unstageable Modifier: Quantity: 1 Physician Procedures CPT4 Code Description: [id number] 50354 - WC PHYS LEVEL 4 - NEW PT ICD-10 Diagnosis Description L89.610 Pressure ulcer of right heel, unstageable L89.890 Pressure ulcer of other site, unstageable I10 Essential (primary) hypertension I25.10 Atherosclerotic  heart disease of  native coronary artery withou Modifier: 25 t angina pectori Quantity: 1 s CPT4 Code Description: [id number] 51700 - WC PHYS SUBQ TISS 20 SQ CM ICD-10 Diagnosis Description L89.610 Pressure ulcer of right heel, unstageable Modifier: Quantity: 1 Electronic Signature(s) Signed: 02/03/2019 4:54:16 PM By: Worthy Keeler PA-C Entered By: Worthy Keeler on 02/03/2019 16:54:16

## 2019-02-07 LAB — AEROBIC CULTURE W GRAM STAIN (SUPERFICIAL SPECIMEN)

## 2019-02-10 ENCOUNTER — Other Ambulatory Visit: Payer: Self-pay

## 2019-02-10 ENCOUNTER — Encounter: Payer: Medicare Other | Admitting: Physician Assistant

## 2019-02-10 DIAGNOSIS — E11621 Type 2 diabetes mellitus with foot ulcer: Secondary | ICD-10-CM | POA: Diagnosis not present

## 2019-02-10 NOTE — Progress Notes (Addendum)
PAIGE, VANDERWOUDE ([id number]) Visit Report for 02/10/2019 Chief Complaint Document Details Patient Name: [name], [name]. Date of Service: 02/10/2019 3:00 PM Medical Record Number: [id number] Patient Account Number: [id number] Date of Birth/Sex: 1930/12/10 (85 y.o. M) Treating RN: Army Melia Primary Care Provider: Caryl Pina Other Clinician: Referring Provider: Caryl Pina Treating Provider/Extender: Melburn Hake, Jaquelynn Wanamaker Weeks in Treatment: 1 Information Obtained from: Patient Chief Complaint Heel pressure ulcer and right 4th and 5th toe pressure ulcers Electronic Signature(s) Signed: 02/10/2019 3:31:28 PM By: Worthy Keeler PA-C Entered By: Worthy Keeler on 02/10/2019 15:31:27 Lukic, [name] ([id number]) -------------------------------------------------------------------------------- HPI Details Patient Name: Bobby, Ho. Date of Service: 02/10/2019 3:00 PM Medical Record Number: [id number] Patient Account Number: [id number] Date of Birth/Sex: 1931-01-02 (85 y.o. M) Treating RN: Army Melia Primary Care Provider: Caryl Pina Other Clinician: Referring Provider: Caryl Pina Treating Provider/Extender: Melburn Hake, Tanganika Barradas Weeks in Treatment: 1 History of Present Illness HPI Description: 02/03/2019 patient presents today for initial evaluation in our clinic concerning issues that he has been having with wounds on his right heel as well as the right fourth and fifth toes. The toe ulcers have been present for about 5 months. They are pretty much just dry and eschar covered at this point but appear to be stable. The heel ulcer has been present for about 2 months and is very soft the eschar is loosening and there appears to be purulent drainage as well noted at this point there is also erythema surrounding the wound bed. I am unsure if this is just inflammation or secondary to infection to be perfectly honest. With that being said the patient has recently  gotten Prevalon offloading boots and he is essentially nonambulatory at this point. He did have a coronary artery bypass graft x4 in 1989. Otherwise he tells me that he also has hypertension and urinary incontinence. Obviously that is not playing any role in his wounds currently based on what we are seeing today. 02/10/2019 on evaluation today patient appears to be doing quite well with regard to his wounds. He is not showing a lot of signs of improvement yet but he still has a lot of eschar at this point. He still awaiting the vascular evaluation at this time as well. Fortunately there is no signs of active infection. No fevers, chills, nausea, vomiting, or diarrhea. Electronic Signature(s) Signed: 02/10/2019 5:13:11 PM By: Worthy Keeler PA-C Entered By: Worthy Keeler on 02/10/2019 17:13:11 Stimson, [name] ([id number]) -------------------------------------------------------------------------------- Physical Exam Details Patient Name: Bobby, Ho. Date of Service: 02/10/2019 3:00 PM Medical Record Number: [id number] Patient Account Number: [id number] Date of Birth/Sex: [birth year] (85 y.o. M) Treating RN: Army Melia Primary Care Provider: Caryl Pina Other Clinician: Referring Provider: Caryl Pina Treating Provider/Extender: STONE III, Kaydra Borgen Weeks in Treatment: 1 Constitutional Well-nourished and well-hydrated in no acute distress. Respiratory normal breathing without difficulty. clear to auscultation bilaterally. Cardiovascular regular rate and rhythm with normal S1, S2. Psychiatric this patient is able to make decisions and demonstrates good insight into disease process. Alert and Oriented x 3. pleasant and cooperative. Notes Patient's wound currently again showed some eschar noted on the surface of the wounds we will try to get this to loosen up if at all possible. With that being said right now that is not really making a whole lot of improvement at this time  infected appears to be somewhat dry still at this time. Obviously working to keep working out this. His culture did show evidence of 3 bacteria.  This included Pseudomonas, Enterococcus, and Klebsiella. With that being said I could potentially treat all 3 with the use of Cipro along with amoxicillin which was my initial recommendation. I did actually already sent this into the pharmacy for him. Subsequently they were somewhat concerned about Cipro due to some family members and a close friend has had issues with Cipro side effects. The patient himself has never had any issues but subsequently wanted to be very cautious in this regard which I can completely understand. For that reason I am going to not utilize the Cipro and in light of this I explained that what we would likely switch him to is Omnicef at this point. Electronic Signature(s) Signed: 02/10/2019 5:15:26 PM By: Worthy Keeler PA-C Entered By: Worthy Keeler on 02/10/2019 17:15:26 Hoon, [name] ([id number]) -------------------------------------------------------------------------------- Physician Orders Details Patient Name: Bobby, Ho. Date of Service: 02/10/2019 3:00 PM Medical Record Number: [id number] Patient Account Number: [id number] Date of Birth/Sex: September 09, 1930 (85 y.o. M) Treating RN: Army Melia Primary Care Provider: Caryl Pina Other Clinician: Referring Provider: Caryl Pina Treating Provider/Extender: Melburn Hake, Tymika Grilli Weeks in Treatment: 1 Verbal / Phone Orders: No Diagnosis Coding ICD-10 Coding Code Description L89.610 Pressure ulcer of right heel, unstageable L89.890 Pressure ulcer of other site, unstageable I10 Essential (primary) hypertension I25.10 Atherosclerotic heart disease of native coronary artery without angina pectoris N39.498 Other specified urinary incontinence Wound Cleansing Wound #1 Right Calcaneus o Clean wound with Normal Saline. - in office o Cleanse wound with  mild soap and water Wound #2 Right Toe Fourth o Clean wound with Normal Saline. - in office o Cleanse wound with mild soap and water Wound #3 Right Toe Fifth o Clean wound with Normal Saline. - in office o Cleanse wound with mild soap and water Skin Barriers/Peri-Wound Care Wound #2 Right Toe Fourth o Other: - Betadine Wound #3 Right Toe Fifth o Other: - Betadine Primary Wound Dressing Wound #1 Right Calcaneus o Iodoflex Secondary Dressing Wound #1 Right Calcaneus o ABD pad o Conform/Kerlix o Other - heel cup Dressing Change Frequency Wound #1 Right Calcaneus o Change Dressing Tuesday and Thursday. - Thursday in office KALIB, BHAGAT ([id number]) Wound #2 Right Toe Fourth o Change Dressing Tuesday and Thursday. - Thursday in office Wound #3 Right Toe Fifth o Change Dressing Tuesday and Thursday. - Thursday in office Follow-up Appointments Wound #1 Right Calcaneus o Return Appointment in 1 week. Wound #2 Right Toe Fourth o Return Appointment in 1 week. Wound #3 Right Toe Fifth o Return Appointment in 1 week. Home Health Wound #1 Right [place] Visits - [place] Nurse may visit PRN to address patientos wound care needs. o FACE TO FACE ENCOUNTER: MEDICARE and MEDICAID PATIENTS: I certify that this patient is under my care and that I had a face-to-face encounter that meets the physician face-to-face encounter requirements with this patient on this date. The encounter with the patient was in whole or in part for the following MEDICAL CONDITION: (primary reason for [place]) MEDICAL NECESSITY: I certify, that based on my findings, NURSING services are a medically necessary home health service. HOME BOUND STATUS: I certify that my clinical findings support that this patient is homebound (i.e., Due to illness or injury, pt requires aid of supportive devices such as crutches, cane, wheelchairs,  walkers, the use of special transportation or the assistance of another person to leave their place of residence. There is a normal inability to leave the  home and doing so requires considerable and taxing effort. Other absences are for medical reasons / religious services and are infrequent or of short duration when for other reasons). o If current dressing causes regression in wound condition, may D/C ordered dressing product/s and apply Normal Saline Moist Dressing daily until next [place] / Other MD appointment. [place] of regression in wound condition at [phone]. o Please direct any NON-WOUND related issues/requests for orders to patient's Primary Care Physician Wound #2 Right Toe [place] Visits - [place] Nurse may visit PRN to address patientos wound care needs. o FACE TO FACE ENCOUNTER: MEDICARE and MEDICAID PATIENTS: I certify that this patient is under my care and that I had a face-to-face encounter that meets the physician face-to-face encounter requirements with this patient on this date. The encounter with the patient was in whole or in part for the following MEDICAL CONDITION: (primary reason for [place]) MEDICAL NECESSITY: I certify, that based on my findings, NURSING services are a medically necessary home health service. HOME BOUND STATUS: I certify that my clinical findings support that this patient is homebound (i.e., Due to illness or injury, pt requires aid of supportive devices such as crutches, cane, wheelchairs, walkers, the use of special transportation or the assistance of another person to leave their place of residence. There is a normal inability to leave the home and doing so requires considerable and taxing effort. Other absences are for medical reasons / religious services and are infrequent or of short duration when for other reasons). o If current dressing causes  regression in wound condition, may D/C ordered dressing product/s and apply Normal Saline Moist Dressing daily until next [place] / Other MD appointment. [place] of regression in wound condition at [phone]. o Please direct any NON-WOUND related issues/requests for orders to patient's Primary Care Physician Wound #3 Right Toe [place] Visits - [place] Nurse may visit PRN to address patientos wound care needs. CASHUS, HALTERMAN ([id number]) o FACE TO FACE ENCOUNTER: MEDICARE and MEDICAID PATIENTS: I certify that this patient is under my care and that I had a face-to-face encounter that meets the physician face-to-face encounter requirements with this patient on this date. The encounter with the patient was in whole or in part for the following MEDICAL CONDITION: (primary reason for [place]) MEDICAL NECESSITY: I certify, that based on my findings, NURSING services are a medically necessary home health service. HOME BOUND STATUS: I certify that my clinical findings support that this patient is homebound (i.e., Due to illness or injury, pt requires aid of supportive devices such as crutches, cane, wheelchairs, walkers, the use of special transportation or the assistance of another person to leave their place of residence. There is a normal inability to leave the home and doing so requires considerable and taxing effort. Other absences are for medical reasons / religious services and are infrequent or of short duration when for other reasons). o If current dressing causes regression in wound condition, may D/C ordered dressing product/s and apply Normal Saline Moist Dressing daily until next [place] / Other MD appointment. [place] of regression in wound condition at [phone]. o Please direct any NON-WOUND related issues/requests for orders to patient's Primary Care  Physician Patient Medications Allergies: Sulfa (Sulfonamide Antibiotics), aspirin Notifications Medication Indication Start End Cipro 02/10/2019 DOSE 1 - oral 500 mg tablet -  1 tablet oral taken 2 times a day for 15 days amoxicillin 02/10/2019 DOSE 1 - oral 875 mg tablet - 1 tablet oral taken 2 times a day for 15 days cefdinir 02/10/2019 DOSE 1 - oral 300 mg capsule - 1 capsule oral taken 2 times a day for 15 days Electronic Signature(s) Signed: 02/10/2019 4:17:39 PM By: Worthy Keeler PA-C Previous Signature: 02/10/2019 4:01:00 PM Version By: Worthy Keeler PA-C Entered By: Worthy Keeler on 02/10/2019 16:17:39 Wygant, [name] ([id number]) -------------------------------------------------------------------------------- Problem List Details Patient Name: SHAILEN, THIELEN. Date of Service: 02/10/2019 3:00 PM Medical Record Number: [id number] Patient Account Number: [id number] Date of Birth/Sex: [birth year] (85 y.o. M) Treating RN: Army Melia Primary Care Provider: Caryl Pina Other Clinician: Referring Provider: Caryl Pina Treating Provider/Extender: Melburn Hake, Chou Busler Weeks in Treatment: 1 Active Problems ICD-10 Evaluated Encounter Code Description Active Date Today Diagnosis L89.610 Pressure ulcer of right heel, unstageable 02/03/2019 No Yes L89.890 Pressure ulcer of other site, unstageable 02/03/2019 No Yes I10 Essential (primary) hypertension 02/03/2019 No Yes I25.10 Atherosclerotic heart disease of native coronary artery 02/03/2019 No Yes without angina pectoris N39.498 Other specified urinary incontinence 02/03/2019 No Yes Inactive Problems Resolved Problems Electronic Signature(s) Signed: 02/10/2019 3:31:20 PM By: Worthy Keeler PA-C Entered By: Worthy Keeler on 02/10/2019 15:31:19 Kroon, [name] ([id number]) -------------------------------------------------------------------------------- Progress Note Details Patient Name: Jacqualyn Posey. Date  of Service: 02/10/2019 3:00 PM Medical Record Number: [id number] Patient Account Number: [id number] Date of Birth/Sex: 1930-11-25 (85 y.o. M) Treating RN: Army Melia Primary Care Provider: Caryl Pina Other Clinician: Referring Provider: Caryl Pina Treating Provider/Extender: Melburn Hake, Zamzam Whinery Weeks in Treatment: 1 Subjective Chief Complaint Information obtained from Patient Heel pressure ulcer and right 4th and 5th toe pressure ulcers History of Present Illness (HPI) 02/03/2019 patient presents today for initial evaluation in our clinic concerning issues that he has been having with wounds on his right heel as well as the right fourth and fifth toes. The toe ulcers have been present for about 5 months. They are pretty much just dry and eschar covered at this point but appear to be stable. The heel ulcer has been present for about 2 months and is very soft the eschar is loosening and there appears to be purulent drainage as well noted at this point there is also erythema surrounding the wound bed. I am unsure if this is just inflammation or secondary to infection to be perfectly honest. With that being said the patient has recently gotten Prevalon offloading boots and he is essentially nonambulatory at this point. He did have a coronary artery bypass graft x4 in 1989. Otherwise he tells me that he also has hypertension and urinary incontinence. Obviously that is not playing any role in his wounds currently based on what we are seeing today. 02/10/2019 on evaluation today patient appears to be doing quite well with regard to his wounds. He is not showing a lot of signs of improvement yet but he still has a lot of eschar at this point. He still awaiting the vascular evaluation at this time as well. Fortunately there is no signs of active infection. No fevers, chills, nausea, vomiting, or diarrhea. Patient History Information obtained from Patient. Family History Cancer - Siblings,  Heart Disease - Mother,Father, Hypertension - Mother,Father, No family history of Diabetes, Hereditary Spherocytosis, Kidney Disease, Lung Disease, Seizures, Stroke, Thyroid Problems, Tuberculosis. Social History Former smoker - 5 years, Marital Status - Married, Alcohol Use - Never, Drug Use - No History,  Caffeine Use - Daily. Medical History Eyes Patient has history of Cataracts - removed Denies history of Glaucoma, Optic Neuritis Cardiovascular Patient has history of Hypertension, Myocardial Infarction Denies history of Angina, Arrhythmia, Congestive Heart Failure, Coronary Artery Disease, Deep Vein Thrombosis, Hypotension, Peripheral Arterial Disease, Peripheral Venous Disease, Phlebitis, Vasculitis Endocrine Patient has history of Type II Diabetes Denies history of Type I Diabetes Genitourinary Denies history of End Stage Renal Disease Integumentary (Skin) Denies history of History of Burn, History of pressure wounds Musculoskeletal LASHAUN, KRAPF ([id number]) Patient has history of Osteoarthritis Denies history of Gout, Rheumatoid Arthritis, Osteomyelitis Neurologic Patient has history of Neuropathy Denies history of Dementia, Quadriplegia, Paraplegia, Seizure Disorder Medical And Surgical History Notes Eyes macular degeneration Cardiovascular CABG x4 1989 Review of Systems (ROS) Constitutional Symptoms (General Health) Denies complaints or symptoms of Fatigue, Fever, Chills, Marked Weight Change. Respiratory Denies complaints or symptoms of Chronic or frequent coughs, Shortness of Breath. Cardiovascular Denies complaints or symptoms of Chest pain, LE edema. Psychiatric Denies complaints or symptoms of Anxiety, Claustrophobia. Objective Constitutional Well-nourished and well-hydrated in no acute distress. Vitals Time Taken: 3:29 PM, Height: 68 in, Weight: 235 lbs, BMI: 35.7, Temperature: 97.9 F, Pulse: 90 bpm, Respiratory Rate: 16 breaths/min, Blood Pressure:  136/61 mmHg. Respiratory normal breathing without difficulty. clear to auscultation bilaterally. Cardiovascular regular rate and rhythm with normal S1, S2. Psychiatric this patient is able to make decisions and demonstrates good insight into disease process. Alert and Oriented x 3. pleasant and cooperative. General Notes: Patient's wound currently again showed some eschar noted on the surface of the wounds we will try to get this to loosen up if at all possible. With that being said right now that is not really making a whole lot of improvement at this time infected appears to be somewhat dry still at this time. Obviously working to keep working out this. His culture did show evidence of 3 bacteria. This included Pseudomonas, Enterococcus, and Klebsiella. With that being said I could potentially treat all 3 with the use of Cipro along with amoxicillin which was my initial recommendation. I did actually already sent this into the pharmacy for him. Subsequently they were somewhat concerned about Cipro due to some family members and a close friend has had issues with Cipro side effects. The patient himself has never had any issues but subsequently wanted to be very cautious in this regard which I can completely understand. For that reason I am going to not utilize the Cipro and in light of this I explained that what we would likely switch him to is Omnicef at this point. SARKIS, RHINES ([id number]) Integumentary (Hair, Skin) Wound #1 status is Open. Original cause of wound was Gradually Appeared. The wound is located on the Right Calcaneus. The wound measures 5cm length x 3cm width x 0.5cm depth; 11.781cm^2 area and 5.89cm^3 volume. There is Fat Layer (Subcutaneous Tissue) Exposed exposed. There is no tunneling or undermining noted. There is a medium amount of serous drainage noted. The wound margin is flat and intact. There is no granulation within the wound bed. There is a large  (67-100%) amount of necrotic tissue within the wound bed. Wound #2 status is Open. Original cause of wound was Gradually Appeared. The wound is located on the Right Toe Fourth. The wound measures 0.3cm length x 0.4cm width x 0.1cm depth; 0.094cm^2 area and 0.009cm^3 volume. There is Fat Layer (Subcutaneous Tissue) Exposed exposed. There is no undermining noted. There is a medium amount of serous drainage  noted. The wound margin is flat and intact. There is no granulation within the wound bed. There is a large (67-100%) amount of necrotic tissue within the wound bed including Eschar. Wound #3 status is Open. Original cause of wound was Pressure Injury. The wound is located on the Right Toe Fifth. The wound measures 0.8cm length x 0.4cm width x 0.1cm depth; 0.251cm^2 area and 0.025cm^3 volume. There is Fat Layer (Subcutaneous Tissue) Exposed exposed. There is no tunneling or undermining noted. There is a medium amount of serous drainage noted. The wound margin is flat and intact. There is no granulation within the wound bed. There is a large (67-100%) amount of necrotic tissue within the wound bed including Eschar. Assessment Active Problems ICD-10 Pressure ulcer of right heel, unstageable Pressure ulcer of other site, unstageable Essential (primary) hypertension Atherosclerotic heart disease of native coronary artery without angina pectoris Other specified urinary incontinence Plan Wound Cleansing: Wound #1 Right Calcaneus: Clean wound with Normal Saline. - in office Cleanse wound with mild soap and water Wound #2 Right Toe Fourth: Clean wound with Normal Saline. - in office Cleanse wound with mild soap and water Wound #3 Right Toe Fifth: Clean wound with Normal Saline. - in office Cleanse wound with mild soap and water Skin Barriers/Peri-Wound Care: Wound #2 Right Toe Fourth: Other: - Betadine Wound #3 Right Toe Fifth: Other: - Betadine Primary Wound Dressing: Wound #1 Right  Calcaneus: LARNIE, HEART ([id number]) Iodoflex Secondary Dressing: Wound #1 Right Calcaneus: ABD pad Conform/Kerlix Other - heel cup Dressing Change Frequency: Wound #1 Right Calcaneus: Change Dressing Tuesday and Thursday. - Thursday in office Wound #2 Right Toe Fourth: Change Dressing Tuesday and Thursday. - Thursday in office Wound #3 Right Toe Fifth: Change Dressing Tuesday and Thursday. - Thursday in office Follow-up Appointments: Wound #1 Right Calcaneus: Return Appointment in 1 week. Wound #2 Right Toe Fourth: Return Appointment in 1 week. Wound #3 Right Toe Fifth: Return Appointment in 1 week. Home Health: Wound #1 Right Calcaneus: Continue Home Health Visits - [place] Nurse may visit PRN to address patient s wound care needs. FACE TO FACE ENCOUNTER: MEDICARE and MEDICAID PATIENTS: I certify that this patient is under my care and that I had a face-to-face encounter that meets the physician face-to-face encounter requirements with this patient on this date. The encounter with the patient was in whole or in part for the following MEDICAL CONDITION: (primary reason for [place]) MEDICAL NECESSITY: I certify, that based on my findings, NURSING services are a medically necessary home health service. HOME BOUND STATUS: I certify that my clinical findings support that this patient is homebound (i.e., Due to illness or injury, pt requires aid of supportive devices such as crutches, cane, wheelchairs, walkers, the use of special transportation or the assistance of another person to leave their place of residence. There is a normal inability to leave the home and doing so requires considerable and taxing effort. Other absences are for medical reasons / religious services and are infrequent or of short duration when for other reasons). If current dressing causes regression in wound condition, may D/C ordered dressing product/s and apply Normal Saline Moist  Dressing daily until next [place] / Other MD appointment. [place] of regression in wound condition at [phone]. Please direct any NON-WOUND related issues/requests for orders to patient's Primary Care Physician Wound #2 Right Toe Fourth: [place] Visits - [place] Nurse may visit PRN to address patient s  wound care needs. FACE TO FACE ENCOUNTER: MEDICARE and MEDICAID PATIENTS: I certify that this patient is under my care and that I had a face-to-face encounter that meets the physician face-to-face encounter requirements with this patient on this date. The encounter with the patient was in whole or in part for the following MEDICAL CONDITION: (primary reason for [place]) MEDICAL NECESSITY: I certify, that based on my findings, NURSING services are a medically necessary home health service. HOME BOUND STATUS: I certify that my clinical findings support that this patient is homebound (i.e., Due to illness or injury, pt requires aid of supportive devices such as crutches, cane, wheelchairs, walkers, the use of special transportation or the assistance of another person to leave their place of residence. There is a normal inability to leave the home and doing so requires considerable and taxing effort. Other absences are for medical reasons / religious services and are infrequent or of short duration when for other reasons). If current dressing causes regression in wound condition, may D/C ordered dressing product/s and apply Normal Saline Moist Dressing daily until next [place] / Other MD appointment. [place] of regression in wound condition at [phone]. Please direct any NON-WOUND related issues/requests for orders to patient's Primary Care Physician Wound #3 Right Toe Fifth: [place] Visits - [place] Nurse may visit PRN to address patient s wound care needs. FACE TO  FACE ENCOUNTER: MEDICARE and MEDICAID PATIENTS: I certify that this patient is under my care and that I had a face-to-face encounter that meets the physician face-to-face encounter requirements with this patient on this date. The encounter with the patient was in whole or in part for the following MEDICAL CONDITION: (primary reason for [place]) MEDICAL NECESSITY: I certify, that based on my findings, NURSING services are a medically necessary home TIMOTHEY, DAHLSTROM ([id number]) health service. HOME BOUND STATUS: I certify that my clinical findings support that this patient is homebound (i.e., Due to illness or injury, pt requires aid of supportive devices such as crutches, cane, wheelchairs, walkers, the use of special transportation or the assistance of another person to leave their place of residence. There is a normal inability to leave the home and doing so requires considerable and taxing effort. Other absences are for medical reasons / religious services and are infrequent or of short duration when for other reasons). If current dressing causes regression in wound condition, may D/C ordered dressing product/s and apply Normal Saline Moist Dressing daily until next [place] / Other MD appointment. [place] of regression in wound condition at [phone]. Please direct any NON-WOUND related issues/requests for orders to patient's Primary Care Physician The following medication(s) was prescribed: Cipro oral 500 mg tablet 1 1 tablet oral taken 2 times a day for 15 days starting 02/10/2019 amoxicillin oral 875 mg tablet 1 1 tablet oral taken 2 times a day for 15 days starting 02/10/2019 cefdinir oral 300 mg capsule 1 1 capsule oral taken 2 times a day for 15 days starting 02/10/2019 1. My suggestion at this time is good to be that we go ahead and initiate treatment with a continuation of the Iodoflex for the heel and Betadine to the toes I think that still  appropriate. 2. We will still awaiting the vascular evaluation at this point and we will see what that shows once everything is obtained. 3. With regard to debridement on hold off on any potential for sharp debridement until  we can at least get verification of good arterial flow. 4. Actually did have him contact the pharmacy to tell them not to fill the prescription for amoxicillin and Cipro. We will subsequently just initiate treatment with the cefdinir for the next 15 days and subsequently I will see the patient back for reevaluation next week to ensure everything seems to be doing okay. 5. Patient will continue to wear the Prevalon offloading boots as well which I think is beneficial for him. We will see patient back for reevaluation in 1 week here in the clinic. If anything worsens or changes patient will contact our office for additional recommendations. Electronic Signature(s) Signed: 02/10/2019 5:16:36 PM By: Worthy Keeler PA-C Entered By: Worthy Keeler on 02/10/2019 17:16:36 Fredman, [name] ([id number]) -------------------------------------------------------------------------------- ROS/PFSH Details Patient Name: TRE, SANKER. Date of Service: 02/10/2019 3:00 PM Medical Record Number: [id number] Patient Account Number: [id number] Date of Birth/Sex: 17-Jul-1930 (85 y.o. M) Treating RN: Army Melia Primary Care Provider: Caryl Pina Other Clinician: Referring Provider: Caryl Pina Treating Provider/Extender: Melburn Hake, Shenekia Riess Weeks in Treatment: 1 Information Obtained From Patient Constitutional Symptoms (General Health) Complaints and Symptoms: Negative for: Fatigue; Fever; Chills; Marked Weight Change Respiratory Complaints and Symptoms: Negative for: Chronic or frequent coughs; Shortness of Breath Cardiovascular Complaints and Symptoms: Negative for: Chest pain; LE edema Medical History: Positive for: Hypertension; Myocardial Infarction Negative for:  Angina; Arrhythmia; Congestive Heart Failure; Coronary Artery Disease; Deep Vein Thrombosis; Hypotension; Peripheral Arterial Disease; Peripheral Venous Disease; Phlebitis; Vasculitis Past Medical History Notes: CABG x4 1989 Psychiatric Complaints and Symptoms: Negative for: Anxiety; Claustrophobia Eyes Medical History: Positive for: Cataracts - removed Negative for: Glaucoma; Optic Neuritis Past Medical History Notes: macular degeneration Endocrine Medical History: Positive for: Type II Diabetes Negative for: Type I Diabetes Time with diabetes: 13 years Treated with: Oral agents Blood sugar tested every day: Yes Tested : Genitourinary KEONTRE, DEFINO ([id number]) Medical History: Negative for: End Stage Renal Disease Integumentary (Skin) Medical History: Negative for: History of Burn; History of pressure wounds Musculoskeletal Medical History: Positive for: Osteoarthritis Negative for: Gout; Rheumatoid Arthritis; Osteomyelitis Neurologic Medical History: Positive for: Neuropathy Negative for: Dementia; Quadriplegia; Paraplegia; Seizure Disorder HBO Extended History Items Eyes: Cataracts Immunizations Pneumococcal Vaccine: Received Pneumococcal Vaccination: Yes Immunization Notes: up to date Implantable Devices None Family and Social History Cancer: Yes - Siblings; Diabetes: No; Heart Disease: Yes - Mother,Father; Hereditary Spherocytosis: No; Hypertension: Yes - Mother,Father; Kidney Disease: No; Lung Disease: No; Seizures: No; Stroke: No; Thyroid Problems: No; Tuberculosis: No; Former smoker - 69 years; Marital Status - Married; Alcohol Use: Never; Drug Use: No History; Caffeine Use: Daily; Financial Concerns: No; Food, Clothing or Shelter Needs: No; Support System Lacking: No; Transportation Concerns: No Physician Affirmation I have reviewed and agree with the above information. Electronic Signature(s) Signed: 02/11/2019 10:58:36 AM By: Worthy Keeler  PA-C Signed: 02/11/2019 11:37:18 AM By: Army Melia Entered By: Worthy Keeler on 02/10/2019 17:13:24 [place], [name] ([id number]) -------------------------------------------------------------------------------- SuperBill Details Patient Name: LEOCADIO, HEAL. Date of Service: 02/10/2019 Medical Record Number: [id number] Patient Account Number: [id number] Date of Birth/Sex: 27-Sep-1930 (85 y.o. M) Treating RN: Army Melia Primary Care Provider: Caryl Pina Other Clinician: Referring Provider: Caryl Pina Treating Provider/Extender: Melburn Hake, Minervia Osso Weeks in Treatment: 1 Diagnosis Coding ICD-10 Codes Code Description L89.610 Pressure ulcer of right heel, unstageable L89.890 Pressure ulcer of other site, unstageable I10 Essential (primary) hypertension I25.10 Atherosclerotic heart disease of native coronary artery without angina pectoris N39.498 Other specified urinary incontinence Facility  Procedures CPT4 Code: [id number] Description: 78676 - WOUND CARE VISIT-LEV 4 EST PT Modifier: Quantity: 1 Physician Procedures CPT4 Code Description: 7209470 96283 - WC PHYS LEVEL 4 - EST PT ICD-10 Diagnosis Description L89.610 Pressure ulcer of right heel, unstageable L89.890 Pressure ulcer of other site, unstageable I10 Essential (primary) hypertension I25.10 Atherosclerotic  heart disease of native coronary artery witho Modifier: ut angina pectori Quantity: 1 s Electronic Signature(s) Signed: 02/10/2019 5:16:47 PM By: Worthy Keeler PA-C Entered By: Worthy Keeler on 02/10/2019 17:16:47

## 2019-02-10 NOTE — Progress Notes (Signed)
Bobby, Ho ([id number]) Visit Report for 02/10/2019 Arrival Information Details Patient Name: [name], [name]. Date of Service: 02/10/2019 3:00 PM Medical Record Number: [id number] Patient Account Number: [id number] Date of Birth/Sex: 1930-08-26 (85 y.o. M) Treating RN: Harold Barban Primary Care Taran Haynesworth: Caryl Pina Other Clinician: Referring Allean Montfort: Caryl Pina Treating Baillie Mohammad/Extender: Melburn Hake, [name]: 1 Visit Information History Since Last Visit Added or deleted any medications: No Patient Arrived: Wheel Chair Any new allergies or adverse reactions: No Arrival Time: 15:28 Had a fall or experienced change in No Accompanied By: son activities of daily living that may affect Transfer Assistance: None risk of falls: Patient Identification Verified: Yes Signs or symptoms of abuse/neglect since last visito No Secondary Verification Process Completed: Yes Hospitalized since last visit: No Has Dressing in Place as Prescribed: Yes Pain Present Now: No Electronic Signature(s) Signed: 02/10/2019 4:33:53 PM By: Harold Barban Entered By: Harold Barban on 02/10/2019 15:28:48 [name], Marca Ho ([id number]) -------------------------------------------------------------------------------- Clinic Level of Care Assessment Details Patient Name: [name]. Date of Service: 02/10/2019 3:00 PM Medical Record Number: [id number] Patient Account Number: [id number] Date of Birth/Sex: Feb 06, 1931 (85 y.o. M) Treating RN: Army Melia Primary Care Yoceline Bazar: Caryl Pina Other Clinician: Referring Carmin Dibartolo: Caryl Pina Treating Monee Dembeck/Extender: Melburn Hake, [name]: 1 Clinic Level of Care Assessment Items TOOL 4 Quantity Score []  - Use when only an EandM is performed on FOLLOW-UP visit 0 ASSESSMENTS - Nursing Assessment / Reassessment X - Reassessment of Co-morbidities (includes updates in patient status) 1 10 X- 1  5 Reassessment of Adherence to Treatment Plan ASSESSMENTS - Wound and Skin Assessment / Reassessment []  - Simple Wound Assessment / Reassessment - one wound 0 X- 3 5 Complex Wound Assessment / Reassessment - multiple wounds []  - 0 Dermatologic / Skin Assessment (not related to wound area) ASSESSMENTS - Focused Assessment []  - Circumferential Edema Measurements - multi extremities 0 []  - 0 Nutritional Assessment / Counseling / Intervention []  - 0 Lower Extremity Assessment (monofilament, tuning fork, pulses) []  - 0 Peripheral Arterial Disease Assessment (using hand held doppler) ASSESSMENTS - Ostomy and/or Continence Assessment and Care []  - Incontinence Assessment and Management 0 []  - 0 Ostomy Care Assessment and Management (repouching, etc.) PROCESS - Coordination of Care X - Simple Patient / Family Education for ongoing care 1 15 []  - 0 Complex (extensive) Patient / Family Education for ongoing care []  - 0 Staff obtains Programmer, systems, Records, Test Results / Process Orders []  - 0 Staff telephones HHA, Nursing Homes / Clarify orders / etc []  - 0 Routine Transfer to another Facility (non-emergent condition) []  - 0 Routine Hospital Admission (non-emergent condition) []  - 0 New Admissions / Biomedical engineer / Ordering NPWT, Apligraf, etc. []  - 0 Emergency Hospital Admission (emergent condition) X- 1 10 Simple Discharge Coordination [name], [name] ([id number]) []  - 0 Complex (extensive) Discharge Coordination PROCESS - Special Needs []  - Pediatric / Minor Patient Management 0 []  - 0 Isolation Patient Management []  - 0 Hearing / Language / Visual special needs []  - 0 Assessment of Community assistance (transportation, D/C planning, etc.) []  - 0 Additional assistance / Altered mentation []  - 0 Support Surface(s) Assessment (bed, cushion, seat, etc.) INTERVENTIONS - Wound Cleansing / Measurement []  - Simple Wound Cleansing - one wound 0 X- 3 5 Complex Wound  Cleansing - multiple wounds X- 1 5 Wound Imaging (photographs - any number of wounds) []  - 0 Wound Tracing (instead of photographs) []  - 0 Simple Wound Measurement -  one wound X- 3 5 Complex Wound Measurement - multiple wounds INTERVENTIONS - Wound Dressings []  - Small Wound Dressing one or multiple wounds 0 X- 3 15 Medium Wound Dressing one or multiple wounds []  - 0 Large Wound Dressing one or multiple wounds []  - 0 Application of Medications - topical []  - 0 Application of Medications - injection INTERVENTIONS - Miscellaneous []  - External ear exam 0 []  - 0 Specimen Collection (cultures, biopsies, blood, body fluids, etc.) []  - 0 Specimen(s) / Culture(s) sent or taken to Lab for analysis []  - 0 Patient Transfer (multiple staff / Civil Service fast streamer / Similar devices) []  - 0 Simple Staple / Suture removal (25 or less) []  - 0 Complex Staple / Suture removal (26 or more) []  - 0 Hypo / Hyperglycemic Management (close monitor of Blood Glucose) []  - 0 Ankle / Brachial Index (ABI) - do not check if billed separately X- 1 5 Vital Signs [name], [name]. ([id number]) Has the patient been seen at the hospital within the last three years: Yes Total Score: 140 Level Of Care: New/Established - Level 4 Electronic Signature(s) Signed: 02/10/2019 4:33:56 PM By: Army Melia Entered By: Army Melia on 02/10/2019 16:13:22 [name] ([id number]) -------------------------------------------------------------------------------- Encounter Discharge Information Details Patient Name: Bobby, Ho. Date of Service: 02/10/2019 3:00 PM Medical Record Number: [id number] Patient Account Number: [id number] Date of Birth/Sex: 1930/04/10 (85 y.o. M) Treating RN: Army Melia Primary Care Reggie Bise: Caryl Pina Other Clinician: Referring Chakara Bognar: Caryl Pina Treating Yomaira Solar/Extender: Melburn Hake, [name]: 1 Encounter Discharge Information Items Discharge  Condition: Stable Ambulatory Status: Wheelchair Discharge Destination: Home Transportation: Private Auto Accompanied By: family Schedule Follow-up Appointment: Yes Clinical Summary of Care: Electronic Signature(s) Signed: 02/10/2019 4:33:56 PM By: Army Melia Entered By: Army Melia on 02/10/2019 16:14:11 Macphail, Marca Ho ([id number]) -------------------------------------------------------------------------------- Lower Extremity Assessment Details Patient Name: AYDDEN, CUMPIAN. Date of Service: 02/10/2019 3:00 PM Medical Record Number: [id number] Patient Account Number: [id number] Date of Birth/Sex: 1930/10/28 (85 y.o. M) Treating RN: Harold Barban Primary Care Salem Lembke: Caryl Pina Other Clinician: Referring Travius Crochet: Caryl Pina Treating Dozier Berkovich/Extender: STONE III, [name]: 1 Vascular Assessment Pulses: Dorsalis Pedis Palpable: [Right:Yes] Posterior Tibial Palpable: [Right:Yes] Electronic Signature(s) Signed: 02/10/2019 4:33:53 PM By: Harold Barban Entered By: Harold Barban on 02/10/2019 15:40:04 Ehly, Marca Ho ([id number]) -------------------------------------------------------------------------------- Multi Wound Chart Details Patient Name: [name], [name]. Date of Service: 02/10/2019 3:00 PM Medical Record Number: [id number] Patient Account Number: [id number] Date of Birth/Sex: 31-Oct-1930 (85 y.o. M) Treating RN: Army Melia Primary Care Juwon Scripter: Caryl Pina Other Clinician: Referring Rhaya Coale: Caryl Pina Treating Marlea Gambill/Extender: Melburn Hake, [name]: 1 Vital Signs Height(in): 68 Pulse(bpm): 90 Weight(lbs): 235 Blood Pressure(mmHg): 136/61 Body Mass Index(BMI): 36 Temperature(F): 97.9 Respiratory Rate 16 (breaths/min): Photos: Wound Location: Right Calcaneus Right Toe Fourth Right Toe Fifth Wounding Event: Gradually Appeared Gradually Appeared Pressure Injury Primary Etiology:  Pressure Ulcer Pressure Ulcer Pressure Ulcer Comorbid History: Cataracts, Hypertension, Cataracts, Hypertension, Cataracts, Hypertension, Myocardial Infarction, Type II Myocardial Infarction, Type II Myocardial Infarction, Type II Diabetes, Osteoarthritis, Diabetes, Osteoarthritis, Diabetes, Osteoarthritis, Neuropathy Neuropathy Neuropathy Date Acquired: 12/05/2018 09/05/2018 09/05/2018 Weeks of Treatment: 1 1 1  Wound Status: Open Open Open Measurements L x W x D 5x3x0.5 0.3x0.4x0.1 0.8x0.4x0.1 (cm) Area (cm) : 11.781 0.094 0.251 Volume (cm) : 5.89 0.009 0.025 % Reduction in Area: -67.40% 0.00% 20.10% % Reduction in Volume: -67.40% 0.00% 19.40% Classification: Unstageable/Unclassified Unstageable/Unclassified Unstageable/Unclassified Exudate Amount: Medium Medium Medium Exudate Type: Serous Serous Serous Exudate Color: amber  amber amber Wound Margin: Flat and Intact Flat and Intact Flat and Intact Granulation Amount: None Present (0%) None Present (0%) None Present (0%) Necrotic Amount: Large (67-100%) Large (67-100%) Large (67-100%) Necrotic Tissue: N/A Eschar Eschar Exposed Structures: Fat Layer (Subcutaneous Fat Layer (Subcutaneous Fat Layer (Subcutaneous Tissue) Exposed: Yes Tissue) Exposed: Yes Tissue) Exposed: Yes Fascia: No Fascia: No Fascia: No Tendon: No Tendon: No Tendon: No Muscle: No Muscle: No Muscle: No [name], [name] ([id number]) Joint: No Joint: No Joint: No Bone: No Bone: No Bone: No Epithelialization: None None None Treatment Notes Electronic Signature(s) Signed: 02/10/2019 4:33:56 PM By: Army Melia Entered By: Army Melia on 02/10/2019 16:06:17 Symmonds, Marca Ho ([id number]) -------------------------------------------------------------------------------- [place] Details Patient Name: [name], [name]. Date of Service: 02/10/2019 3:00 PM Medical Record Number: [id number] Patient Account Number: [id number] Date of  Birth/Sex: April 25, 1930 (85 y.o. M) Treating RN: Army Melia Primary Care Arlan Birks: Caryl Pina Other Clinician: Referring Abhay Godbolt: Caryl Pina Treating Larene Ascencio/Extender: Melburn Hake, [name]: 1 Active Inactive Orientation to the Wound Care Program Nursing Diagnoses: Knowledge deficit related to the wound healing center program Goals: Patient/caregiver will verbalize understanding of the [place] Program Date Initiated: 02/03/2019 Target Resolution Date: 03/04/2019 Goal Status: Active Interventions: Provide education on orientation to the wound center Notes: Pressure Nursing Diagnoses: Knowledge deficit related to causes and risk factors for pressure ulcer development Goals: Patient/caregiver will verbalize risk factors for pressure ulcer development Date Initiated: 02/03/2019 Target Resolution Date: 03/04/2019 Goal Status: Active Interventions: Assess: immobility, friction, shearing, incontinence upon admission and as needed Notes: Wound/Skin Impairment Nursing Diagnoses: Impaired tissue integrity Goals: Ulcer/skin breakdown will have a volume reduction of 30% by week 4 Date Initiated: 02/03/2019 Target Resolution Date: 03/04/2019 Goal Status: Active Interventions: Assess ulceration(s) every visit [name], [name] ([id number]) Notes: Electronic Signature(s) Signed: 02/10/2019 4:33:56 PM By: Army Melia Entered By: Army Melia on 02/10/2019 16:06:06 Davies, Marca Ho ([id number]) -------------------------------------------------------------------------------- Pain Assessment Details Patient Name: [name], [name]. Date of Service: 02/10/2019 3:00 PM Medical Record Number: [id number] Patient Account Number: [id number] Date of Birth/Sex: [birth year] (85 y.o. M) Treating RN: Harold Barban Primary Care Divya Munshi: Caryl Pina Other Clinician: Referring Bridger Pizzi: Caryl Pina Treating Ebrahim Deremer/Extender: Melburn Hake, HOYT Weeks  in Treatment: 1 Active Problems Location of Pain Severity and Description of Pain Patient Has Paino Yes Site Locations Pain Location: Pain in Ulcers Pain Management and Medication Current Pain Management: Electronic Signature(s) Signed: 02/10/2019 4:33:53 PM By: Harold Barban Entered By: Harold Barban on 02/10/2019 15:29:31 Fye, Marca Ho ([id number]) -------------------------------------------------------------------------------- Patient/Caregiver Education Details Patient Name: [name], [name]. Date of Service: 02/10/2019 3:00 PM Medical Record Number: [id number] Patient Account Number: [id number] Date of Birth/Gender: 1930/11/03 (85 y.o. M) Treating RN: Army Melia Primary Care Physician: Caryl Pina Other Clinician: Referring Physician: Caryl Pina Treating Physician/Extender: Sharalyn Ink in Treatment: 1 Education Assessment Education Provided To: Patient Education Topics Provided Wound/Skin Impairment: Handouts: Caring for Your Ulcer Methods: Demonstration, Explain/Verbal Responses: State content correctly Electronic Signature(s) Signed: 02/10/2019 4:33:56 PM By: Army Melia Entered By: Army Melia on 02/10/2019 16:13:33 Fidalgo, Marca Ho ([id number]) -------------------------------------------------------------------------------- Wound Assessment Details Patient Name: [name], [name]. Date of Service: 02/10/2019 3:00 PM Medical Record Number: [id number] Patient Account Number: [id number] Date of Birth/Sex: 1930-09-24 (85 y.o. M) Treating RN: Harold Barban Primary Care Zahir Eisenhour: Caryl Pina Other Clinician: Referring Jaishaun Mcnab: Caryl Pina Treating Alvino Lechuga/Extender: STONE III, [name]: 1 Wound Status Wound Number: 1 Primary Pressure Ulcer Etiology: Wound Location: Right Calcaneus Wound Open Wounding  Event: Gradually Appeared Status: Date Acquired: 12/05/2018 Comorbid Cataracts, Hypertension,  Myocardial Infarction, Weeks Of Treatment: 1 History: Type II Diabetes, Osteoarthritis, Neuropathy Clustered Wound: No Photos Wound Measurements Length: (cm) 5 % Reduction in Width: (cm) 3 % Reduction in Depth: (cm) 0.5 Epithelializat Area: (cm) 11.781 Tunneling: Volume: (cm) 5.89 Undermining: Area: -67.4% Volume: -67.4% ion: None No No Wound Description Classification: Unstageable/Unclassified Foul Odor Aft Wound Margin: Flat and Intact Slough/Fibrin Exudate Amount: Medium Exudate Type: Serous Exudate Color: amber er Cleansing: No o Yes Wound Bed Granulation Amount: None Present (0%) Exposed Structure Necrotic Amount: Large (67-100%) Fascia Exposed: No Fat Layer (Subcutaneous Tissue) Exposed: Yes Tendon Exposed: No Muscle Exposed: No Joint Exposed: No Bone Exposed: No Treatment Notes [name], [name] ([id number]) Wound #1 (Right Calcaneus) Notes iodoflex to heel, betadine to toes Electronic Signature(s) Signed: 02/10/2019 4:33:53 PM By: Harold Barban Entered By: Harold Barban on 02/10/2019 15:38:38 Shuey, Marca Ho ([id number]) -------------------------------------------------------------------------------- Wound Assessment Details Patient Name: [name]. Date of Service: 02/10/2019 3:00 PM Medical Record Number: [id number] Patient Account Number: [id number] Date of Birth/Sex: 1930-04-30 (85 y.o. M) Treating RN: Harold Barban Primary Care Roniyah Llorens: Caryl Pina Other Clinician: Referring Zareya Tuckett: Caryl Pina Treating Sonyia Muro/Extender: Melburn Hake, [name]: 1 Wound Status Wound Number: 2 Primary Pressure Ulcer Etiology: Wound Location: Right Toe Fourth Wound Open Wounding Event: Gradually Appeared Status: Date Acquired: 09/05/2018 Comorbid Cataracts, Hypertension, Myocardial Infarction, Weeks Of Treatment: 1 History: Type II Diabetes, Osteoarthritis, Neuropathy Clustered Wound: No Photos Wound  Measurements Length: (cm) 0.3 % Reduction in Width: (cm) 0.4 % Reduction in Depth: (cm) 0.1 Epithelializat Area: (cm) 0.094 Undermining: Volume: (cm) 0.009 Area: 0% Volume: 0% ion: None No Wound Description Classification: Unstageable/Unclassified Foul Odor Aft Wound Margin: Flat and Intact Slough/Fibrin Exudate Amount: Medium Exudate Type: Serous Exudate Color: amber er Cleansing: No o No Wound Bed Granulation Amount: None Present (0%) Exposed Structure Necrotic Amount: Large (67-100%) Fascia Exposed: No Necrotic Quality: Eschar Fat Layer (Subcutaneous Tissue) Exposed: Yes Tendon Exposed: No Muscle Exposed: No Joint Exposed: No Bone Exposed: No Treatment Notes WANDA, CELLUCCI ([id number]) Wound #2 (Right Toe Fourth) Notes iodoflex to heel, betadine to toes Electronic Signature(s) Signed: 02/10/2019 4:33:53 PM By: Harold Barban Entered By: Harold Barban on 02/10/2019 15:39:19 Chizek, Marca Ho ([id number]) -------------------------------------------------------------------------------- Wound Assessment Details Patient Name: [name]. Date of Service: 02/10/2019 3:00 PM Medical Record Number: [id number] Patient Account Number: [id number] Date of Birth/Sex: 01-05-1931 (85 y.o. M) Treating RN: Harold Barban Primary Care Berneice Zettlemoyer: Caryl Pina Other Clinician: Referring Qaadir Kent: Caryl Pina Treating Yaret Hush/Extender: Melburn Hake, [name]: 1 Wound Status Wound Number: 3 Primary Pressure Ulcer Etiology: Wound Location: Right Toe Fifth Wound Open Wounding Event: Pressure Injury Status: Date Acquired: 09/05/2018 Comorbid Cataracts, Hypertension, Myocardial Infarction, Weeks Of Treatment: 1 History: Type II Diabetes, Osteoarthritis, Neuropathy Clustered Wound: No Photos Wound Measurements Length: (cm) 0.8 % Reduction i Width: (cm) 0.4 % Reduction i Depth: (cm) 0.1 Epithelializa Area: (cm) 0.251 Tunneling: Volume:  (cm) 0.025 Undermining: n Area: 20.1% n Volume: 19.4% tion: None No No Wound Description Classification: Unstageable/Unclassified Foul Odor Af Wound Margin: Flat and Intact Slough/Fibri Exudate Amount: Medium Exudate Type: Serous Exudate Color: amber ter Cleansing: No no No Wound Bed Granulation Amount: None Present (0%) Exposed Structure Necrotic Amount: Large (67-100%) Fascia Exposed: No Necrotic Quality: Eschar Fat Layer (Subcutaneous Tissue) Exposed: Yes Tendon Exposed: No Muscle Exposed: No Joint Exposed: No Bone Exposed: No Treatment Notes TIMOFEY, CARANDANG ([id number]) Wound #3 (Right Toe Fifth)  Notes iodoflex to heel, betadine to toes Electronic Signature(s) Signed: 02/10/2019 4:33:53 PM By: Harold Barban Entered By: Harold Barban on 02/10/2019 15:39:45 Flink, Marca Ho ([id number]) -------------------------------------------------------------------------------- Vitals Details Patient Name: [name]. Date of Service: 02/10/2019 3:00 PM Medical Record Number: [id number] Patient Account Number: [id number] Date of Birth/Sex: 06/10/1930 (85 y.o. M) Treating RN: Harold Barban Primary Care Lynnda Wiersma: Caryl Pina Other Clinician: Referring Jerron Niblack: Caryl Pina Treating Cary Wilford/Extender: Melburn Hake, [name]: 1 Vital Signs Time Taken: 15:29 Temperature (F): 97.9 Height (in): 68 Pulse (bpm): 90 Weight (lbs): 235 Respiratory Rate (breaths/min): 16 Body Mass Index (BMI): 35.7 Blood Pressure (mmHg): 136/61 Reference Range: 80 - 120 mg / dl Electronic Signature(s) Signed: 02/10/2019 4:33:53 PM By: Harold Barban Entered By: Harold Barban on 02/10/2019 15:31:05

## 2019-02-11 ENCOUNTER — Other Ambulatory Visit: Payer: Self-pay | Admitting: Family Medicine

## 2019-02-17 ENCOUNTER — Other Ambulatory Visit: Payer: Self-pay

## 2019-02-17 ENCOUNTER — Ambulatory Visit
Admission: RE | Admit: 2019-02-17 | Discharge: 2019-02-17 | Disposition: A | Discharge status: Home / Self Care | Payer: Medicare Other | Source: Ambulatory Visit | Attending: Physician Assistant | Admitting: Physician Assistant

## 2019-02-17 ENCOUNTER — Encounter: Payer: Medicare Other | Admitting: Physician Assistant

## 2019-02-17 ENCOUNTER — Other Ambulatory Visit: Payer: Self-pay | Admitting: Physician Assistant

## 2019-02-17 DIAGNOSIS — E11621 Type 2 diabetes mellitus with foot ulcer: Secondary | ICD-10-CM | POA: Diagnosis not present

## 2019-02-17 DIAGNOSIS — T148XXA Other injury of unspecified body region, initial encounter: Secondary | ICD-10-CM

## 2019-02-17 NOTE — Progress Notes (Addendum)
[name] ([id number]) Visit Report for 02/17/2019 Chief Complaint Document Details Patient Name: [name], [name]. Date of Service: 02/17/2019 11:30 AM Medical Record Number: [id number] Patient Account Number: [id number] Date of Birth/Sex: May 23, 1930 (85 y.o. M) Treating RN: Army Melia Primary Care Provider: Caryl Pina Other Clinician: Referring Provider: Caryl Pina Treating Provider/Extender: Melburn Hake, Liat Mayol Weeks in Treatment: 2 Information Obtained from: Patient Chief Complaint Heel pressure ulcer and right 4th and 5th toe pressure ulcers Electronic Signature(s) Signed: 02/17/2019 11:26:02 AM By: Worthy Keeler PA-C Entered By: Worthy Keeler on 02/17/2019 11:26:01 Holzmann, [name] ([id number]) -------------------------------------------------------------------------------- HPI Details Patient Name: [name]. Date of Service: 02/17/2019 11:30 AM Medical Record Number: [id number] Patient Account Number: [id number] Date of Birth/Sex: June 18, 1930 (85 y.o. M) Treating RN: Army Melia Primary Care Provider: Caryl Pina Other Clinician: Referring Provider: Caryl Pina Treating Provider/Extender: Melburn Hake, Kamera Dubas Weeks in Treatment: 2 History of Present Illness HPI Description: 02/03/2019 patient presents today for initial evaluation in our clinic concerning issues that he has been having with wounds on his right heel as well as the right fourth and fifth toes. The toe ulcers have been present for about 5 months. They are pretty much just dry and eschar covered at this point but appear to be stable. The heel ulcer has been present for about 2 months and is very soft the eschar is loosening and there appears to be purulent drainage as well noted at this point there is also erythema surrounding the wound bed. I am unsure if this is just inflammation or secondary to infection to be perfectly honest. With that being said the patient has recently  gotten Prevalon offloading boots and he is essentially nonambulatory at this point. He did have a coronary artery bypass graft x4 in 1989. Otherwise he tells me that he also has hypertension and urinary incontinence. Obviously that is not playing any role in his wounds currently based on what we are seeing today. 02/10/2019 on evaluation today patient appears to be doing quite well with regard to his wounds. He is not showing a lot of signs of improvement yet but he still has a lot of eschar at this point. He still awaiting the vascular evaluation at this time as well. Fortunately there is no signs of active infection. No fevers, chills, nausea, vomiting, or diarrhea. 02/17/2019 on evaluation today patient appears to be doing quite well with regard to his wound. This seems to be greatly improved compared to last visit I do believe that the cefdinir is doing well for him. Fortunately there is no signs of active infection at this time. No fever chills noted no fevers, chills, nausea, vomiting, or diarrhea. Electronic Signature(s) Signed: 02/17/2019 12:07:05 PM By: Worthy Keeler PA-C Entered By: Worthy Keeler on 02/17/2019 12:07:05 ADELAIDO, NICKLAUS ([id number]) -------------------------------------------------------------------------------- Physical Exam Details Patient Name: [name]. Date of Service: 02/17/2019 11:30 AM Medical Record Number: [id number] Patient Account Number: [id number] Date of Birth/Sex: [birth year] (85 y.o. M) Treating RN: Army Melia Primary Care Provider: Caryl Pina Other Clinician: Referring Provider: Caryl Pina Treating Provider/Extender: STONE III, Kaylie Ritter Weeks in Treatment: 2 Constitutional Well-nourished and well-hydrated in no acute distress. Respiratory normal breathing without difficulty. clear to auscultation bilaterally. Cardiovascular regular rate and rhythm with normal S1, S2. Psychiatric this patient is able to make decisions and  demonstrates good insight into disease process. Alert and Oriented x 3. pleasant and cooperative. Notes Patient's wound currently showed signs of excellent improvement with regard to the  erythema and drainage with regard to his heel. I believe that there is definitely improvement here compared to what we have seen before and I think that the cefdinir is doing a great job for him. We would likely may want to extend this for at least a week past the initial prescription considering the holiday and how it seems to be helping him so well. Also think he likely needs an x-ray of his heel. Electronic Signature(s) Signed: 02/17/2019 12:07:44 PM By: Worthy Keeler PA-C Entered By: Worthy Keeler on 02/17/2019 12:07:44 Tallo, [name] ([id number]) -------------------------------------------------------------------------------- Physician Orders Details Patient Name: [name]. Date of Service: 02/17/2019 11:30 AM Medical Record Number: [id number] Patient Account Number: [id number] Date of Birth/Sex: 1930/08/06 (85 y.o. M) Treating RN: Army Melia Primary Care Provider: Caryl Pina Other Clinician: Referring Provider: Caryl Pina Treating Provider/Extender: Melburn Hake, Amaris Garrette Weeks in Treatment: 2 Verbal / Phone Orders: No Diagnosis Coding ICD-10 Coding Code Description L89.610 Pressure ulcer of right heel, unstageable L89.890 Pressure ulcer of other site, unstageable I10 Essential (primary) hypertension I25.10 Atherosclerotic heart disease of native coronary artery without angina pectoris N39.498 Other specified urinary incontinence Wound Cleansing Wound #1 Right Calcaneus o Clean wound with Normal Saline. - in office o Cleanse wound with mild soap and water Wound #2 Right Toe Fourth o Clean wound with Normal Saline. - in office o Cleanse wound with mild soap and water Wound #3 Right Toe Fifth o Clean wound with Normal Saline. - in office o Cleanse wound  with mild soap and water Skin Barriers/Peri-Wound Care Wound #2 Right Toe Fourth o Other: - Betadine Wound #3 Right Toe Fifth o Other: - Betadine Primary Wound Dressing Wound #1 Right Calcaneus o Iodoflex Secondary Dressing Wound #1 Right Calcaneus o ABD pad o Conform/Kerlix o Other - heel cup Dressing Change Frequency Wound #1 Right Calcaneus o Change Dressing Tuesday and Thursday. - twice a week by [facility] until next appointment ACEYN, KATHOL ([id number]) Follow-up Appointments Wound #1 Right Calcaneus o Return Appointment in 2 weeks. Wound #2 Right Toe Fourth o Return Appointment in 2 weeks. Wound #3 Right Toe Fifth o Return Appointment in 2 weeks. Home Health Wound #1 Right [place] Visits - [place] Nurse may visit PRN to address patientos wound care needs. o FACE TO FACE ENCOUNTER: MEDICARE and MEDICAID PATIENTS: I certify that this patient is under my care and that I had a face-to-face encounter that meets the physician face-to-face encounter requirements with this patient on this date. The encounter with the patient was in whole or in part for the following MEDICAL CONDITION: (primary reason for [place]) MEDICAL NECESSITY: I certify, that based on my findings, NURSING services are a medically necessary home health service. HOME BOUND STATUS: I certify that my clinical findings support that this patient is homebound (i.e., Due to illness or injury, pt requires aid of supportive devices such as crutches, cane, wheelchairs, walkers, the use of special transportation or the assistance of another person to leave their place of residence. There is a normal inability to leave the home and doing so requires considerable and taxing effort. Other absences are for medical reasons / religious services and are infrequent or of short duration when for other reasons). o If current dressing causes regression in  wound condition, may D/C ordered dressing product/s and apply Normal Saline Moist Dressing daily until next [place] / Other MD appointment. [place] of regression in  wound condition at [phone]. o Please direct any NON-WOUND related issues/requests for orders to patient's Primary Care Physician Wound #2 Right Toe [place] Visits - [place] Nurse may visit PRN to address patientos wound care needs. o FACE TO FACE ENCOUNTER: MEDICARE and MEDICAID PATIENTS: I certify that this patient is under my care and that I had a face-to-face encounter that meets the physician face-to-face encounter requirements with this patient on this date. The encounter with the patient was in whole or in part for the following MEDICAL CONDITION: (primary reason for [place]) MEDICAL NECESSITY: I certify, that based on my findings, NURSING services are a medically necessary home health service. HOME BOUND STATUS: I certify that my clinical findings support that this patient is homebound (i.e., Due to illness or injury, pt requires aid of supportive devices such as crutches, cane, wheelchairs, walkers, the use of special transportation or the assistance of another person to leave their place of residence. There is a normal inability to leave the home and doing so requires considerable and taxing effort. Other absences are for medical reasons / religious services and are infrequent or of short duration when for other reasons). o If current dressing causes regression in wound condition, may D/C ordered dressing product/s and apply Normal Saline Moist Dressing daily until next [place] / Other MD appointment. [place] of regression in wound condition at [phone]. o Please direct any NON-WOUND related issues/requests for orders to patient's Primary Care Physician Wound #3 Right Toe [place] Visits - [place] Nurse may visit PRN to address patientos wound care needs. o FACE TO FACE ENCOUNTER: MEDICARE and MEDICAID PATIENTS: I certify that this patient is under my care and that I had a face-to-face encounter that meets the physician face-to-face encounter requirements with this patient on this date. The encounter with the patient was in whole or in part for the following MEDICAL CONDITION: (primary reason for [place]) MEDICAL NECESSITY: I certify, that based on my findings, NURSING services are a medically necessary home health service. HOME BOUND STATUS: I certify that my clinical findings support that this patient is homebound (i.e., Due to illness or injury, pt requires aid of supportive devices such as crutches, cane, wheelchairs, walkers, the use of special transportation or the CAMARON, CAMMACK ([id number]) assistance of another person to leave their place of residence. There is a normal inability to leave the home and doing so requires considerable and taxing effort. Other absences are for medical reasons / religious services and are infrequent or of short duration when for other reasons). o If current dressing causes regression in wound condition, may D/C ordered dressing product/s and apply Normal Saline Moist Dressing daily until next [place] / Other MD appointment. [place] of regression in wound condition at [phone]. o Please direct any NON-WOUND related issues/requests for orders to patient's Primary Care Physician Radiology o X-ray, foot - Right foot o X-ray, heel - Right heel Patient Medications Allergies: Sulfa (Sulfonamide Antibiotics), aspirin Notifications Medication Indication Start End cefdinir 02/17/2019 DOSE 1 - oral 300 mg capsule - 1 capsule oral taken 2 times a day for 7 days to add onto the original prescription Electronic Signature(s) Signed: 02/17/2019 12:10:28 PM By:  Worthy Keeler PA-C Entered By: Worthy Keeler on 02/17/2019 12:10:27 [place]weg, [name] ([id number]) -------------------------------------------------------------------------------- Problem List Details Patient Name: MILBURN, FREENEY. Date of Service: 02/17/2019  11:30 AM Medical Record Number: [id number] Patient Account Number: [id number] Date of Birth/Sex: 08-07-1930 (85 y.o. M) Treating RN: Army Melia Primary Care Provider: Caryl Pina Other Clinician: Referring Provider: Caryl Pina Treating Provider/Extender: Melburn Hake, Jamarien Rodkey Weeks in Treatment: 2 Active Problems ICD-10 Evaluated Encounter Code Description Active Date Today Diagnosis L89.610 Pressure ulcer of right heel, unstageable 02/03/2019 No Yes L89.890 Pressure ulcer of other site, unstageable 02/03/2019 No Yes I10 Essential (primary) hypertension 02/03/2019 No Yes I25.10 Atherosclerotic heart disease of native coronary artery 02/03/2019 No Yes without angina pectoris N39.498 Other specified urinary incontinence 02/03/2019 No Yes Inactive Problems Resolved Problems Electronic Signature(s) Signed: 02/17/2019 11:25:57 AM By: Worthy Keeler PA-C Entered By: Worthy Keeler on 02/17/2019 11:25:57 Flath, [name] ([id number]) -------------------------------------------------------------------------------- Progress Note Details Patient Name: Jacqualyn Posey. Date of Service: 02/17/2019 11:30 AM Medical Record Number: [id number] Patient Account Number: [id number] Date of Birth/Sex: [birth year] (85 y.o. M) Treating RN: Army Melia Primary Care Provider: Caryl Pina Other Clinician: Referring Provider: Caryl Pina Treating Provider/Extender: Melburn Hake, Sola Margolis Weeks in Treatment: 2 Subjective Chief Complaint Information obtained from Patient Heel pressure ulcer and right 4th and 5th toe pressure ulcers History of Present Illness (HPI) 02/03/2019 patient presents today for initial evaluation in our  clinic concerning issues that he has been having with wounds on his right heel as well as the right fourth and fifth toes. The toe ulcers have been present for about 5 months. They are pretty much just dry and eschar covered at this point but appear to be stable. The heel ulcer has been present for about 2 months and is very soft the eschar is loosening and there appears to be purulent drainage as well noted at this point there is also erythema surrounding the wound bed. I am unsure if this is just inflammation or secondary to infection to be perfectly honest. With that being said the patient has recently gotten Prevalon offloading boots and he is essentially nonambulatory at this point. He did have a coronary artery bypass graft x4 in 1989. Otherwise he tells me that he also has hypertension and urinary incontinence. Obviously that is not playing any role in his wounds currently based on what we are seeing today. 02/10/2019 on evaluation today patient appears to be doing quite well with regard to his wounds. He is not showing a lot of signs of improvement yet but he still has a lot of eschar at this point. He still awaiting the vascular evaluation at this time as well. Fortunately there is no signs of active infection. No fevers, chills, nausea, vomiting, or diarrhea. 02/17/2019 on evaluation today patient appears to be doing quite well with regard to his wound. This seems to be greatly improved compared to last visit I do believe that the cefdinir is doing well for him. Fortunately there is no signs of active infection at this time. No fever chills noted no fevers, chills, nausea, vomiting, or diarrhea. Patient History Information obtained from Patient. Family History Cancer - Siblings, Heart Disease - Mother,Father, Hypertension - Mother,Father, No family history of Diabetes, Hereditary Spherocytosis, Kidney Disease, Lung Disease, Seizures, Stroke, Thyroid Problems, Tuberculosis. Social  History Former smoker - 34 years, Marital Status - Married, Alcohol Use - Never, Drug Use - No History, Caffeine Use - Daily. Medical History Eyes Patient has history of Cataracts - removed Denies history of Glaucoma, Optic Neuritis Cardiovascular Patient has history of Hypertension, Myocardial Infarction Denies history of Angina, Arrhythmia, Congestive Heart Failure, Coronary Artery Disease, Deep Vein  Thrombosis, Hypotension, Peripheral Arterial Disease, Peripheral Venous Disease, Phlebitis, Vasculitis Endocrine Patient has history of Type II Diabetes Denies history of Type I Diabetes Genitourinary NURI, LARMER ([id number]) Denies history of End Stage Renal Disease Integumentary (Skin) Denies history of History of Burn, History of pressure wounds Musculoskeletal Patient has history of Osteoarthritis Denies history of Gout, Rheumatoid Arthritis, Osteomyelitis Neurologic Patient has history of Neuropathy Denies history of Dementia, Quadriplegia, Paraplegia, Seizure Disorder Medical And Surgical History Notes Eyes macular degeneration Cardiovascular CABG x4 1989 Review of Systems (ROS) Constitutional Symptoms (General Health) Denies complaints or symptoms of Fatigue, Fever, Chills, Marked Weight Change. Respiratory Denies complaints or symptoms of Chronic or frequent coughs, Shortness of Breath. Cardiovascular Denies complaints or symptoms of Chest pain, LE edema. Psychiatric Denies complaints or symptoms of Anxiety, Claustrophobia. Objective Constitutional Well-nourished and well-hydrated in no acute distress. Vitals Time Taken: 11:25 AM, Height: 68 in, Weight: 235 lbs, BMI: 35.7, Temperature: 97.7 F, Pulse: 102 bpm, Respiratory Rate: 16 breaths/min, Blood Pressure: 122/66 mmHg. Respiratory normal breathing without difficulty. clear to auscultation bilaterally. Cardiovascular regular rate and rhythm with normal S1, S2. Psychiatric this patient is able to make  decisions and demonstrates good insight into disease process. Alert and Oriented x 3. pleasant and cooperative. General Notes: Patient's wound currently showed signs of excellent improvement with regard to the erythema and drainage with regard to his heel. I believe that there is definitely improvement here compared to what we have seen before and I think that the cefdinir is doing a great job for him. We would likely may want to extend this for at least a week past the initial prescription considering the holiday and how it seems to be helping him so well. Also think he likely needs an x-ray of his heel. KORDE, JEPPSEN ([id number]) Integumentary (Hair, Skin) Wound #1 status is Open. Original cause of wound was Gradually Appeared. The wound is located on the Right Calcaneus. The wound measures 5cm length x 3.2cm width x 0.5cm depth; 12.566cm^2 area and 6.283cm^3 volume. There is Fat Layer (Subcutaneous Tissue) Exposed exposed. There is a medium amount of serous drainage noted. The wound margin is flat and intact. There is no granulation within the wound bed. There is a large (67-100%) amount of necrotic tissue within the wound bed. Wound #2 status is Open. Original cause of wound was Gradually Appeared. The wound is located on the Right Toe Fourth. The wound measures 0.2cm length x 0.3cm width x 0.1cm depth; 0.047cm^2 area and 0.005cm^3 volume. There is Fat Layer (Subcutaneous Tissue) Exposed exposed. There is no tunneling or undermining noted. There is a medium amount of serous drainage noted. The wound margin is flat and intact. There is no granulation within the wound bed. There is a large (67-100%) amount of necrotic tissue within the wound bed including Eschar. Wound #3 status is Open. Original cause of wound was Pressure Injury. The wound is located on the Right Toe Fifth. The wound measures 0.7cm length x 0.6cm width x 0.1cm depth; 0.33cm^2 area and 0.033cm^3 volume. There is Fat  Layer (Subcutaneous Tissue) Exposed exposed. There is no tunneling or undermining noted. There is a medium amount of serous drainage noted. The wound margin is flat and intact. There is no granulation within the wound bed. There is a large (67-100%) amount of necrotic tissue within the wound bed including Eschar. Assessment Active Problems ICD-10 Pressure ulcer of right heel, unstageable Pressure ulcer of other site, unstageable Essential (primary) hypertension Atherosclerotic heart disease of native  coronary artery without angina pectoris Other specified urinary incontinence Plan Wound Cleansing: Wound #1 Right Calcaneus: Clean wound with Normal Saline. - in office Cleanse wound with mild soap and water Wound #2 Right Toe Fourth: Clean wound with Normal Saline. - in office Cleanse wound with mild soap and water Wound #3 Right Toe Fifth: Clean wound with Normal Saline. - in office Cleanse wound with mild soap and water Skin Barriers/Peri-Wound Care: Wound #2 Right Toe Fourth: Other: - Betadine Wound #3 Right Toe Fifth: Other: - Betadine Primary Wound Dressing: Wound #1 Right Calcaneus: JUANDEDIOS, DUDASH ([id number]) Secondary Dressing: Wound #1 Right Calcaneus: ABD pad Conform/Kerlix Other - heel cup Dressing Change Frequency: Wound #1 Right Calcaneus: Change Dressing Tuesday and Thursday. - twice a week by [facility] until next appointment Follow-up Appointments: Wound #1 Right Calcaneus: Return Appointment in 2 weeks. Wound #2 Right Toe Fourth: Return Appointment in 2 weeks. Wound #3 Right Toe Fifth: Return Appointment in 2 weeks. Home Health: Wound #1 Right Calcaneus: Continue Home Health Visits - [place] Nurse may visit PRN to address patient s wound care needs. FACE TO FACE ENCOUNTER: MEDICARE and MEDICAID PATIENTS: I certify that this patient is under my care and that I had a face-to-face encounter that meets the physician face-to-face encounter  requirements with this patient on this date. The encounter with the patient was in whole or in part for the following MEDICAL CONDITION: (primary reason for [place]) MEDICAL NECESSITY: I certify, that based on my findings, NURSING services are a medically necessary home health service. HOME BOUND STATUS: I certify that my clinical findings support that this patient is homebound (i.e., Due to illness or injury, pt requires aid of supportive devices such as crutches, cane, wheelchairs, walkers, the use of special transportation or the assistance of another person to leave their place of residence. There is a normal inability to leave the home and doing so requires considerable and taxing effort. Other absences are for medical reasons / religious services and are infrequent or of short duration when for other reasons). If current dressing causes regression in wound condition, may D/C ordered dressing product/s and apply Normal Saline Moist Dressing daily until next [place] / Other MD appointment. [place] of regression in wound condition at [phone]. Please direct any NON-WOUND related issues/requests for orders to patient's Primary Care Physician Wound #2 Right Toe Fourth: [place] Visits - [place] Nurse may visit PRN to address patient s wound care needs. FACE TO FACE ENCOUNTER: MEDICARE and MEDICAID PATIENTS: I certify that this patient is under my care and that I had a face-to-face encounter that meets the physician face-to-face encounter requirements with this patient on this date. The encounter with the patient was in whole or in part for the following MEDICAL CONDITION: (primary reason for [place]) MEDICAL NECESSITY: I certify, that based on my findings, NURSING services are a medically necessary home health service. HOME BOUND STATUS: I certify that my clinical findings support that this patient is homebound (i.e.,  Due to illness or injury, pt requires aid of supportive devices such as crutches, cane, wheelchairs, walkers, the use of special transportation or the assistance of another person to leave their place of residence. There is a normal inability to leave the home and doing so requires considerable and taxing effort. Other absences are for medical reasons / religious services and are infrequent or of short duration when for other reasons). If current dressing  causes regression in wound condition, may D/C ordered dressing product/s and apply Normal Saline Moist Dressing daily until next [place] / Other MD appointment. [place] of regression in wound condition at [phone]. Please direct any NON-WOUND related issues/requests for orders to patient's Primary Care Physician Wound #3 Right Toe Fifth: [place] Visits - [place] Nurse may visit PRN to address patient s wound care needs. FACE TO FACE ENCOUNTER: MEDICARE and MEDICAID PATIENTS: I certify that this patient is under my care and that I had a face-to-face encounter that meets the physician face-to-face encounter requirements with this patient on this date. The encounter with the patient was in whole or in part for the following MEDICAL CONDITION: (primary reason for [place]) MEDICAL NECESSITY: I certify, that based on my findings, NURSING services are a medically necessary home health service. HOME BOUND STATUS: I certify that my clinical findings support that this patient is homebound (i.e., Due to illness or injury, pt requires aid of supportive devices such as crutches, cane, wheelchairs, walkers, the use of special transportation or the assistance of another person to leave their place of residence. There is a normal inability to leave the home and doing so requires considerable and taxing effort. Other absences are for medical reasons / religious services and are infrequent or  of short duration when for other reasons). LIONARDO, HAZE ([id number]) If current dressing causes regression in wound condition, may D/C ordered dressing product/s and apply Normal Saline Moist Dressing daily until next Powers / Other MD appointment. [employer] of regression in wound condition at [phone]. Please direct any NON-WOUND related issues/requests for orders to patient's Primary Care Physician Radiology ordered were: X-ray, foot - Right foot, X-ray, heel - Right heel The following medication(s) was prescribed: cefdinir oral 300 mg capsule 1 1 capsule oral taken 2 times a day for 7 days to add onto the original prescription starting 02/17/2019 1. I would recommend currently that we go ahead and send the patient for an x-ray of the right foot and heel in order to evaluate for any signs of osteomyelitis. 2. I would recommend as well that we continue with Betadine to the patient's toes. We will also continue with the Iodoflex to the right heel as I feel like that is doing decently well for him. Everything is becoming much more dry which is actually an improvement in my opinion. 3. I am also going to recommend that we continue to monitor the patient for any signs of worsening with regard to infection or blood flow. I have put in a referral for vascular testing which includes an arterial study with ABI and TBI. We have not heard anything as far as the schedule time for this yet. We will see patient back for reevaluation in 2 weeks here in the clinic. If anything worsens or changes patient will contact our office for additional recommendations. Electronic Signature(s) Signed: 02/17/2019 12:10:58 PM By: Worthy Keeler PA-C Previous Signature: 02/17/2019 12:09:00 PM Version By: Worthy Keeler PA-C Entered By: Worthy Keeler on 02/17/2019 12:10:58 Vastine, [name]  ([id number]) -------------------------------------------------------------------------------- ROS/PFSH Details Patient Name: RONDARIUS, KADRMAS. Date of Service: 02/17/2019 11:30 AM Medical Record Number: [id number] Patient Account Number: [id number] Date of Birth/Sex: Mar 21, 1930 (85 y.o. M) Treating RN: Army Melia Primary Care Provider: Caryl Pina Other Clinician: Referring Provider: Caryl Pina Treating Provider/Extender: Melburn Hake, Ina Scrivens Weeks in Treatment: 2 Information Obtained From Patient Constitutional Symptoms (General  Health) Complaints and Symptoms: Negative for: Fatigue; Fever; Chills; Marked Weight Change Respiratory Complaints and Symptoms: Negative for: Chronic or frequent coughs; Shortness of Breath Cardiovascular Complaints and Symptoms: Negative for: Chest pain; LE edema Medical History: Positive for: Hypertension; Myocardial Infarction Negative for: Angina; Arrhythmia; Congestive Heart Failure; Coronary Artery Disease; Deep Vein Thrombosis; Hypotension; Peripheral Arterial Disease; Peripheral Venous Disease; Phlebitis; Vasculitis Past Medical History Notes: CABG x4 1989 Psychiatric Complaints and Symptoms: Negative for: Anxiety; Claustrophobia Eyes Medical History: Positive for: Cataracts - removed Negative for: Glaucoma; Optic Neuritis Past Medical History Notes: macular degeneration Endocrine Medical History: Positive for: Type II Diabetes Negative for: Type I Diabetes Time with diabetes: 12 years Treated with: Oral agents Blood sugar tested every day: Yes Tested : Genitourinary GERRAD, WELKER ([id number]) Medical History: Negative for: End Stage Renal Disease Integumentary (Skin) Medical History: Negative for: History of Burn; History of pressure wounds Musculoskeletal Medical History: Positive for: Osteoarthritis Negative for: Gout; Rheumatoid Arthritis; Osteomyelitis Neurologic Medical History: Positive for:  Neuropathy Negative for: Dementia; Quadriplegia; Paraplegia; Seizure Disorder HBO Extended History Items Eyes: Cataracts Immunizations Pneumococcal Vaccine: Received Pneumococcal Vaccination: Yes Immunization Notes: up to date Implantable Devices None Family and Social History Cancer: Yes - Siblings; Diabetes: No; Heart Disease: Yes - Mother,Father; Hereditary Spherocytosis: No; Hypertension: Yes - Mother,Father; Kidney Disease: No; Lung Disease: No; Seizures: No; Stroke: No; Thyroid Problems: No; Tuberculosis: No; Former smoker - 21 years; Marital Status - Married; Alcohol Use: Never; Drug Use: No History; Caffeine Use: Daily; Financial Concerns: No; Food, Clothing or Shelter Needs: No; Support System Lacking: No; Transportation Concerns: No Physician Affirmation I have reviewed and agree with the above information. Electronic Signature(s) Signed: 02/17/2019 12:15:27 PM By: Army Melia Signed: 02/17/2019 12:19:39 PM By: Worthy Keeler PA-C Entered By: Worthy Keeler on 02/17/2019 12:07:21 MYLIK, PRO ([id number]) -------------------------------------------------------------------------------- SuperBill Details Patient Name: PHILO, KURTZ. Date of Service: 02/17/2019 Medical Record Number: [id number] Patient Account Number: [id number] Date of Birth/Sex: 12/25/1930 (85 y.o. M) Treating RN: Army Melia Primary Care Provider: Caryl Pina Other Clinician: Referring Provider: Caryl Pina Treating Provider/Extender: Melburn Hake, Trevious Rampey Weeks in Treatment: 2 Diagnosis Coding ICD-10 Codes Code Description L89.610 Pressure ulcer of right heel, unstageable L89.890 Pressure ulcer of other site, unstageable I10 Essential (primary) hypertension I25.10 Atherosclerotic heart disease of native coronary artery without angina pectoris N39.498 Other specified urinary incontinence Facility Procedures CPT4 Code: [id number] Description: 99214 - WOUND CARE VISIT-LEV 4 EST  PT Modifier: Quantity: 1 Physician Procedures CPT4 Code Description: [id number] 99214 - WC PHYS LEVEL 4 - EST PT ICD-10 Diagnosis Description L89.610 Pressure ulcer of right heel, unstageable L89.890 Pressure ulcer of other site, unstageable I10 Essential (primary) hypertension I25.10 Atherosclerotic  heart disease of native coronary artery witho Modifier: ut angina pectori Quantity: 1 s Electronic Signature(s) Signed: 02/17/2019 12:11:18 PM By: Worthy Keeler PA-C Entered By: Worthy Keeler on 02/17/2019 12:11:18

## 2019-02-22 ENCOUNTER — Other Ambulatory Visit (INDEPENDENT_AMBULATORY_CARE_PROVIDER_SITE_OTHER): Payer: Self-pay | Admitting: Physician Assistant

## 2019-02-22 DIAGNOSIS — M79672 Pain in left foot: Secondary | ICD-10-CM

## 2019-02-22 DIAGNOSIS — M79671 Pain in right foot: Secondary | ICD-10-CM

## 2019-02-23 ENCOUNTER — Ambulatory Visit (INDEPENDENT_AMBULATORY_CARE_PROVIDER_SITE_OTHER): Payer: Medicare Other

## 2019-02-23 ENCOUNTER — Other Ambulatory Visit: Payer: Self-pay

## 2019-02-23 DIAGNOSIS — M79671 Pain in right foot: Secondary | ICD-10-CM | POA: Diagnosis not present

## 2019-02-23 DIAGNOSIS — M79672 Pain in left foot: Secondary | ICD-10-CM

## 2019-03-02 NOTE — Progress Notes (Signed)
DEMONTRAE, [name] ([id number]) Visit Report for 02/17/2019 Arrival Information Details Patient Name: [name], [name]. Date of Service: 02/17/2019 11:30 AM Medical Record Number: [id number] Patient Account Number: [id number] Date of Birth/Sex: 09-Jul-1930 (86 y.o. M) Treating RN: Army Melia Primary Care Bashar Milam: Caryl Pina Other Clinician: Referring Gerlene Glassburn: Caryl Pina Treating Stephie Xu/Extender: Melburn Hake, [name]: 2 Visit Information History Since Last Visit Added or deleted any medications: No Patient Arrived: Wheel Chair Any new allergies or adverse reactions: No Arrival Time: 11:24 Had a fall or experienced change in No Accompanied By: son activities of daily living that may affect Transfer Assistance: None risk of falls: Patient Identification Verified: Yes Signs or symptoms of abuse/neglect since last visito No Secondary Verification Process Completed: Yes Hospitalized since last visit: No Implantable device outside of the clinic excluding No cellular tissue based products placed in the center since last visit: Has Dressing in Place as Prescribed: Yes Pain Present Now: Yes Electronic Signature(s) Signed: 02/17/2019 12:35:02 PM By: Lorine Bears RCP, RRT, CHT Entered By: Lorine Bears on 02/17/2019 11:25:30 Hund, [name] ([id number]) -------------------------------------------------------------------------------- Clinic Level of Care Assessment Details Patient Name: [name]. Date of Service: 02/17/2019 11:30 AM Medical Record Number: [id number] Patient Account Number: [id number] Date of Birth/Sex: March 11, 1930 (86 y.o. M) Treating RN: Army Melia Primary Care Aslan Himes: Caryl Pina Other Clinician: Referring Nataki Mccrumb: Caryl Pina Treating Anesha Hackert/Extender: Melburn Hake, [name]: 2 Clinic Level of Care Assessment Items TOOL 4 Quantity Score []  - Use when only an EandM is  performed on FOLLOW-UP visit 0 ASSESSMENTS - Nursing Assessment / Reassessment X - Reassessment of Co-morbidities (includes updates in patient status) 1 10 X- 1 5 Reassessment of Adherence to Treatment Plan ASSESSMENTS - Wound and Skin Assessment / Reassessment []  - Simple Wound Assessment / Reassessment - one wound 0 X- 3 5 Complex Wound Assessment / Reassessment - multiple wounds []  - 0 Dermatologic / Skin Assessment (not related to wound area) ASSESSMENTS - Focused Assessment []  - Circumferential Edema Measurements - multi extremities 0 []  - 0 Nutritional Assessment / Counseling / Intervention []  - 0 Lower Extremity Assessment (monofilament, tuning fork, pulses) []  - 0 Peripheral Arterial Disease Assessment (using hand held doppler) ASSESSMENTS - Ostomy and/or Continence Assessment and Care []  - Incontinence Assessment and Management 0 []  - 0 Ostomy Care Assessment and Management (repouching, etc.) PROCESS - Coordination of Care X - Simple Patient / Family Education for ongoing care 1 15 []  - 0 Complex (extensive) Patient / Family Education for ongoing care []  - 0 Staff obtains Programmer, systems, Records, Test Results / Process Orders []  - 0 Staff telephones HHA, Nursing Homes / Clarify orders / etc []  - 0 Routine Transfer to another Facility (non-emergent condition) []  - 0 Routine Hospital Admission (non-emergent condition) []  - 0 New Admissions / Biomedical engineer / Ordering NPWT, Apligraf, etc. []  - 0 Emergency Hospital Admission (emergent condition) X- 1 10 Simple Discharge Coordination SANTOSH, PETTER ([id number]) []  - 0 Complex (extensive) Discharge Coordination PROCESS - Special Needs []  - Pediatric / Minor Patient Management 0 []  - 0 Isolation Patient Management []  - 0 Hearing / Language / Visual special needs []  - 0 Assessment of Community assistance (transportation, D/C planning, etc.) []  - 0 Additional assistance / Altered mentation []  - 0 Support  Surface(s) Assessment (bed, cushion, seat, etc.) INTERVENTIONS - Wound Cleansing / Measurement []  - Simple Wound Cleansing - one wound 0 X- 3 5 Complex Wound Cleansing - multiple wounds X-  1 5 Wound Imaging (photographs - any number of wounds) []  - 0 Wound Tracing (instead of photographs) []  - 0 Simple Wound Measurement - one wound X- 3 5 Complex Wound Measurement - multiple wounds INTERVENTIONS - Wound Dressings []  - Small Wound Dressing one or multiple wounds 0 X- 3 15 Medium Wound Dressing one or multiple wounds []  - 0 Large Wound Dressing one or multiple wounds []  - 0 Application of Medications - topical []  - 0 Application of Medications - injection INTERVENTIONS - Miscellaneous []  - External ear exam 0 []  - 0 Specimen Collection (cultures, biopsies, blood, body fluids, etc.) []  - 0 Specimen(s) / Culture(s) sent or taken to Lab for analysis []  - 0 Patient Transfer (multiple staff / Civil Service fast streamer / Similar devices) []  - 0 Simple Staple / Suture removal (25 or less) []  - 0 Complex Staple / Suture removal (26 or more) []  - 0 Hypo / Hyperglycemic Management (close monitor of Blood Glucose) []  - 0 Ankle / Brachial Index (ABI) - do not check if billed separately X- 1 5 Vital Signs Limbach, ACE BERGFELD. ([id number]) Has the patient been seen at the hospital within the last three years: Yes Total Score: 140 Level Of Care: New/Established - Level 4 Electronic Signature(s) Signed: 02/17/2019 12:15:27 PM By: Army Melia Entered By: Army Melia on 02/17/2019 12:00:27 Rodenberg, [name] ([id number]) -------------------------------------------------------------------------------- Encounter Discharge Information Details Patient Name: Ho, [name]. Date of Service: 02/17/2019 11:30 AM Medical Record Number: [id number] Patient Account Number: [id number] Date of Birth/Sex: [birth year] (86 y.o. M) Treating RN: Army Melia Primary Care Jveon Pound: Caryl Pina Other  Clinician: Referring Fatiha Guzy: Caryl Pina Treating Yen Wandell/Extender: Melburn Hake, [name]: 2 Encounter Discharge Information Items Discharge Condition: Stable Ambulatory Status: Wheelchair Discharge Destination: Home Transportation: Private Auto Accompanied By: family Schedule Follow-up Appointment: Yes Clinical Summary of Care: Electronic Signature(s) Signed: 02/17/2019 12:15:27 PM By: Army Melia Entered By: Army Melia on 02/17/2019 12:01:26 Pangle, [name] ([id number]) -------------------------------------------------------------------------------- Lower Extremity Assessment Details Patient Name: JAZIR, NEWEY. Date of Service: 02/17/2019 11:30 AM Medical Record Number: [id number] Patient Account Number: [id number] Date of Birth/Sex: [birth year] (86 y.o. M) Treating RN: Cornell Barman Primary Care Shaida Route: Caryl Pina Other Clinician: Referring Tasheena Wambolt: Caryl Pina Treating Anberlin Diez/Extender: Melburn Hake, [name]: 2 Vascular Assessment Pulses: Dorsalis Pedis Palpable: [Right:Yes] Electronic Signature(s) Signed: 03/02/2019 4:51:46 PM By: Gretta Cool, BSN, RN, CWS, Kim RN, BSN Entered By: Gretta Cool, BSN, RN, CWS, Kim on 02/17/2019 11:35:23 Ponds, [name] ([id number]) -------------------------------------------------------------------------------- Multi Wound Chart Details Patient Name: TOREN, TUCHOLSKI. Date of Service: 02/17/2019 11:30 AM Medical Record Number: [id number] Patient Account Number: [id number] Date of Birth/Sex: 04-06-1930 (86 y.o. M) Treating RN: Army Melia Primary Care Breslyn Abdo: Caryl Pina Other Clinician: Referring Keyandra Swenson: Caryl Pina Treating Kieana Livesay/Extender: Melburn Hake, [name]: 2 Vital Signs Height(in): 68 Pulse(bpm): 102 Weight(lbs): 235 Blood Pressure(mmHg): 122/66 Body Mass Index(BMI): 36 Temperature(F): 97.7 Respiratory Rate 16 (breaths/min): Photos: Wound  Location: Right Calcaneus Right Toe Fourth Right Toe Fifth Wounding Event: Gradually Appeared Gradually Appeared Pressure Injury Primary Etiology: Pressure Ulcer Pressure Ulcer Pressure Ulcer Comorbid History: Cataracts, Hypertension, Cataracts, Hypertension, Cataracts, Hypertension, Myocardial Infarction, Type II Myocardial Infarction, Type II Myocardial Infarction, Type II Diabetes, Osteoarthritis, Diabetes, Osteoarthritis, Diabetes, Osteoarthritis, Neuropathy Neuropathy Neuropathy Date Acquired: 12/05/2018 09/05/2018 09/05/2018 Weeks of Treatment: 2 2 2  Wound Status: Open Open Open Measurements L x W x D 5x3.2x0.5 0.2x0.3x0.1 0.7x0.6x0.1 (cm) Area (cm) : 12.566 0.047 0.33 Volume (cm) : 6.283 0.005 0.033 % Reduction  in Area: -78.60% 50.00% -5.10% % Reduction in Volume: -78.50% 44.40% -6.50% Classification: Unstageable/Unclassified Unstageable/Unclassified Unstageable/Unclassified Exudate Amount: Medium Medium Medium Exudate Type: Serous Serous Serous Exudate Color: amber amber amber Wound Margin: Flat and Intact Flat and Intact Flat and Intact Granulation Amount: None Present (0%) None Present (0%) None Present (0%) Necrotic Amount: Large (67-100%) Large (67-100%) Large (67-100%) Necrotic Tissue: N/A Eschar Eschar Exposed Structures: Fat Layer (Subcutaneous Fat Layer (Subcutaneous Fat Layer (Subcutaneous Tissue) Exposed: Yes Tissue) Exposed: Yes Tissue) Exposed: Yes Fascia: No Fascia: No Fascia: No Tendon: No Tendon: No Tendon: No Muscle: No Muscle: No Muscle: No BIJAN, RIDGLEY ([id number]) Joint: No Joint: No Joint: No Bone: No Bone: No Bone: No Epithelialization: None None None Treatment Notes Electronic Signature(s) Signed: 02/17/2019 12:15:27 PM By: Army Melia Entered By: Army Melia on 02/17/2019 11:53:50 Carmicheal, [name] ([id number]) -------------------------------------------------------------------------------- Multi-Disciplinary Care Plan  Details Patient Name: CORBAN, KISTLER. Date of Service: 02/17/2019 11:30 AM Medical Record Number: [id number] Patient Account Number: [id number] Date of Birth/Sex: 12/26/1930 (86 y.o. M) Treating RN: Army Melia Primary Care Faiza Bansal: Caryl Pina Other Clinician: Referring Indie Boehne: Caryl Pina Treating Neftaly Swiss/Extender: Melburn Hake, [name]: 2 Active Inactive Orientation to the Wound Care Program Nursing Diagnoses: Knowledge deficit related to the wound healing center program Goals: Patient/caregiver will verbalize understanding of the [facility] Program Date Initiated: 02/03/2019 Target Resolution Date: 03/04/2019 Goal Status: Active Interventions: Provide education on orientation to the wound center Notes: Pressure Nursing Diagnoses: Knowledge deficit related to causes and risk factors for pressure ulcer development Goals: Patient/caregiver will verbalize risk factors for pressure ulcer development Date Initiated: 02/03/2019 Target Resolution Date: 03/04/2019 Goal Status: Active Interventions: Assess: immobility, friction, shearing, incontinence upon admission and as needed Notes: Wound/Skin Impairment Nursing Diagnoses: Impaired tissue integrity Goals: Ulcer/skin breakdown will have a volume reduction of 30% by week 4 Date Initiated: 02/03/2019 Target Resolution Date: 03/04/2019 Goal Status: Active Interventions: Assess ulceration(s) every visit RAMESSES, CRAMPTON ([id number]) Notes: Electronic Signature(s) Signed: 02/17/2019 12:15:27 PM By: Army Melia Entered By: Army Melia on 02/17/2019 11:53:41 Garcialopez, [name] ([id number]) -------------------------------------------------------------------------------- Pain Assessment Details Patient Name: Jacqualyn Posey. Date of Service: 02/17/2019 11:30 AM Medical Record Number: [id number] Patient Account Number: [id number] Date of Birth/Sex: [birth year] (86 y.o. M) Treating RN: Army Melia Primary Care Roosevelt Eimers: Caryl Pina Other Clinician: Referring Banjamin Stovall: Caryl Pina Treating Oniel Meleski/Extender: Melburn Hake, [name]: 2 Active Problems Location of Pain Severity and Description of Pain Patient Has Paino Yes Site Locations Rate the pain. Current Pain Level: 3 Pain Management and Medication Current Pain Management: Electronic Signature(s) Signed: 02/17/2019 12:15:27 PM By: Army Melia Signed: 02/17/2019 12:35:02 PM By: Lorine Bears RCP, RRT, CHT Entered By: Lorine Bears on 02/17/2019 11:25:40 Rickenbach, [name] ([id number]) -------------------------------------------------------------------------------- Patient/Caregiver Education Details Patient Name: TYREES, CHOPIN. Date of Service: 02/17/2019 11:30 AM Medical Record Number: [id number] Patient Account Number: [id number] Date of Birth/Gender: 12-19-1930 (86 y.o. M) Treating RN: Army Melia Primary Care Physician: Caryl Pina Other Clinician: Referring Physician: Caryl Pina Treating Physician/Extender: Sharalyn Ink in Treatment: 2 Education Assessment Education Provided To: Patient Education Topics Provided Wound/Skin Impairment: Handouts: Caring for Your Ulcer Methods: Demonstration, Explain/Verbal Responses: State content correctly Electronic Signature(s) Signed: 02/17/2019 12:15:27 PM By: Army Melia Entered By: Army Melia on 02/17/2019 12:00:38 Zwahlen, [name] ([id number]) -------------------------------------------------------------------------------- Wound Assessment Details Patient Name: KINSER, FELLMAN. Date of Service: 02/17/2019 11:30 AM Medical Record Number: [id number] Patient Account Number: [id number] Date of Birth/Sex: 1930/06/26 (  86 y.o. M) Treating RN: Cornell Barman Primary Care Taysia Rivere: Caryl Pina Other Clinician: Referring Catalyna Reilly: Caryl Pina Treating Samyiah Halvorsen/Extender: Melburn Hake,  [name]: 2 Wound Status Wound Number: 1 Primary Pressure Ulcer Etiology: Wound Location: Right Calcaneus Wound Open Wounding Event: Gradually Appeared Status: Date Acquired: 12/05/2018 Comorbid Cataracts, Hypertension, Myocardial Infarction, Weeks Of Treatment: 2 History: Type II Diabetes, Osteoarthritis, Neuropathy Clustered Wound: No Photos Wound Measurements Length: (cm) 5 % Reduction in Width: (cm) 3.2 % Reduction in Depth: (cm) 0.5 Epithelializat Area: (cm) 12.566 Volume: (cm) 6.283 Area: -78.6% Volume: -78.5% ion: None Wound Description Classification: Unstageable/Unclassified Foul Odor Aft Wound Margin: Flat and Intact Slough/Fibrin Exudate Amount: Medium Exudate Type: Serous Exudate Color: amber er Cleansing: No o Yes Wound Bed Granulation Amount: None Present (0%) Exposed Structure Necrotic Amount: Large (67-100%) Fascia Exposed: No Fat Layer (Subcutaneous Tissue) Exposed: Yes Tendon Exposed: No Muscle Exposed: No Joint Exposed: No Bone Exposed: No Treatment Notes KHALI, ALBANESE ([id number]) Wound #1 (Right Calcaneus) Notes Betadine to toes, iodoflex to heel with heel cup and kerlix Electronic Signature(s) Signed: 03/02/2019 4:51:46 PM By: Gretta Cool, BSN, RN, CWS, Kim RN, BSN Entered By: Gretta Cool, BSN, RN, CWS, Kim on 02/17/2019 11:33:36 Beaulieu, [name] ([id number]) -------------------------------------------------------------------------------- Wound Assessment Details Patient Name: ORVA, GWALTNEY. Date of Service: 02/17/2019 11:30 AM Medical Record Number: [id number] Patient Account Number: [id number] Date of Birth/Sex: [birth year] (86 y.o. M) Treating RN: Cornell Barman Primary Care Carsin Randazzo: Caryl Pina Other Clinician: Referring Haile Toppins: Caryl Pina Treating Shanteria Laye/Extender: Melburn Hake, [name]: 2 Wound Status Wound Number: 2 Primary Pressure Ulcer Etiology: Wound Location: Right Toe Fourth Wound  Open Wounding Event: Gradually Appeared Status: Date Acquired: 09/05/2018 Comorbid Cataracts, Hypertension, Myocardial Infarction, Weeks Of Treatment: 2 History: Type II Diabetes, Osteoarthritis, Neuropathy Clustered Wound: No Photos Wound Measurements Length: (cm) 0.2 % Reduction in Width: (cm) 0.3 % Reduction in Depth: (cm) 0.1 Epithelializat Area: (cm) 0.047 Tunneling: Volume: (cm) 0.005 Undermining: Area: 50% Volume: 44.4% ion: None No No Wound Description Classification: Unstageable/Unclassified Foul Odor Af Wound Margin: Flat and Intact Slough/Fibri Exudate Amount: Medium Exudate Type: Serous Exudate Color: amber ter Cleansing: No no No Wound Bed Granulation Amount: None Present (0%) Exposed Structure Necrotic Amount: Large (67-100%) Fascia Exposed: No Necrotic Quality: Eschar Fat Layer (Subcutaneous Tissue) Exposed: Yes Tendon Exposed: No Muscle Exposed: No Joint Exposed: No Bone Exposed: No Treatment Notes JAVARION, DOUTY ([id number]) Wound #2 (Right Toe Fourth) Notes Betadine to toes, iodoflex to heel with heel cup and kerlix Electronic Signature(s) Signed: 03/02/2019 4:51:46 PM By: Gretta Cool, BSN, RN, CWS, Kim RN, BSN Entered By: Gretta Cool, BSN, RN, CWS, Kim on 02/17/2019 11:36:22 Schmuhl, [name] ([id number]) -------------------------------------------------------------------------------- Wound Assessment Details Patient Name: JAEGAR, CROFT. Date of Service: 02/17/2019 11:30 AM Medical Record Number: [id number] Patient Account Number: [id number] Date of Birth/Sex: 15-Jan-1931 (86 y.o. M) Treating RN: Cornell Barman Primary Care Kelii Chittum: Caryl Pina Other Clinician: Referring Uma Jerde: Caryl Pina Treating Aubreyana Saltz/Extender: Melburn Hake, [name]: 2 Wound Status Wound Number: 3 Primary Pressure Ulcer Etiology: Wound Location: Right Toe Fifth Wound Open Wounding Event: Pressure Injury Status: Date Acquired: 09/05/2018 Comorbid  Cataracts, Hypertension, Myocardial Infarction, Weeks Of Treatment: 2 History: Type II Diabetes, Osteoarthritis, Neuropathy Clustered Wound: No Photos Wound Measurements Length: (cm) 0.7 % Reduction i Width: (cm) 0.6 % Reduction i Depth: (cm) 0.1 Epithelializa Area: (cm) 0.33 Tunneling: Volume: (cm) 0.033 Undermining: n Area: -5.1% n Volume: -6.5% tion: None No No Wound Description Classification: Unstageable/Unclassified Foul Odor A Wound  Margin: Flat and Intact Slough/Fibr Exudate Amount: Medium Exudate Type: Serous Exudate Color: amber fter Cleansing: No ino No Wound Bed Granulation Amount: None Present (0%) Exposed Structure Necrotic Amount: Large (67-100%) Fascia Exposed: No Necrotic Quality: Eschar Fat Layer (Subcutaneous Tissue) Exposed: Yes Tendon Exposed: No Muscle Exposed: No Joint Exposed: No Bone Exposed: No Treatment Notes ALIREZA, POLLACK ([id number]) Wound #3 (Right Toe Fifth) Notes Betadine to toes, iodoflex to heel with heel cup and kerlix Electronic Signature(s) Signed: 03/02/2019 4:51:46 PM By: Gretta Cool, BSN, RN, CWS, Kim RN, BSN Entered By: Gretta Cool, BSN, RN, CWS, Kim on 02/17/2019 11:35:06 [place], [name] ([id number]) -------------------------------------------------------------------------------- Vitals Details Patient Name: Jacqualyn Posey. Date of Service: 02/17/2019 11:30 AM Medical Record Number: [id number] Patient Account Number: [id number] Date of Birth/Sex: 11-Mar-1930 (86 y.o. M) Treating RN: Army Melia Primary Care Kimori Tartaglia: Caryl Pina Other Clinician: Referring Adonay Scheier: Caryl Pina Treating Kennett Symes/Extender: Melburn Hake, [name]: 2 Vital Signs Time Taken: 11:25 Temperature (F): 97.7 Height (in): 68 Pulse (bpm): 102 Weight (lbs): 235 Respiratory Rate (breaths/min): 16 Body Mass Index (BMI): 35.7 Blood Pressure (mmHg): 122/66 Reference Range: 80 - 120 mg / dl Electronic Signature(s) Signed:  02/17/2019 12:35:02 PM By: Lorine Bears RCP, RRT, CHT Entered By: Lorine Bears on 02/17/2019 11:29:12

## 2019-03-03 ENCOUNTER — Other Ambulatory Visit: Payer: Self-pay | Admitting: Physician Assistant

## 2019-03-03 ENCOUNTER — Other Ambulatory Visit: Payer: Self-pay

## 2019-03-03 ENCOUNTER — Encounter: Payer: Medicare Other | Attending: Physician Assistant | Admitting: Physician Assistant

## 2019-03-03 DIAGNOSIS — I2510 Atherosclerotic heart disease of native coronary artery without angina pectoris: Secondary | ICD-10-CM | POA: Diagnosis not present

## 2019-03-03 DIAGNOSIS — L97522 Non-pressure chronic ulcer of other part of left foot with fat layer exposed: Secondary | ICD-10-CM | POA: Insufficient documentation

## 2019-03-03 DIAGNOSIS — R32 Unspecified urinary incontinence: Secondary | ICD-10-CM | POA: Diagnosis not present

## 2019-03-03 DIAGNOSIS — Z951 Presence of aortocoronary bypass graft: Secondary | ICD-10-CM | POA: Diagnosis not present

## 2019-03-03 DIAGNOSIS — L97512 Non-pressure chronic ulcer of other part of right foot with fat layer exposed: Secondary | ICD-10-CM | POA: Diagnosis not present

## 2019-03-03 DIAGNOSIS — E11621 Type 2 diabetes mellitus with foot ulcer: Secondary | ICD-10-CM | POA: Diagnosis not present

## 2019-03-03 DIAGNOSIS — L89610 Pressure ulcer of right heel, unstageable: Secondary | ICD-10-CM

## 2019-03-03 DIAGNOSIS — E11622 Type 2 diabetes mellitus with other skin ulcer: Secondary | ICD-10-CM | POA: Diagnosis not present

## 2019-03-03 DIAGNOSIS — I10 Essential (primary) hypertension: Secondary | ICD-10-CM | POA: Insufficient documentation

## 2019-03-03 DIAGNOSIS — L89890 Pressure ulcer of other site, unstageable: Secondary | ICD-10-CM | POA: Diagnosis not present

## 2019-03-03 NOTE — Progress Notes (Addendum)
JAKIM, DRAPEAU ([id number]) Visit Report for 03/03/2019 Chief Complaint Document Details Patient Name: [name], [name]. Date of Service: 03/03/2019 1:30 PM Medical Record Number: [id number] Patient Account Number: [id number] Date of Birth/Sex: 04-13-1930 (86 y.o. M) Treating RN: Army Melia Primary Care Provider: Caryl Pina Other Clinician: Referring Provider: Caryl Pina Treating Provider/Extender: Melburn Hake, Samie Reasons Weeks in Treatment: 4 Information Obtained from: Patient Chief Complaint Heel pressure ulcer and right 4th and 5th toe pressure ulcers along with multiple left toe ulcers Electronic Signature(s) Signed: 03/03/2019 2:16:54 PM By: Worthy Keeler PA-C Entered By: Worthy Keeler on 03/03/2019 14:16:54 Vari, [name] ([id number]) -------------------------------------------------------------------------------- HPI Details Patient Name: [name], [name]. Date of Service: 03/03/2019 1:30 PM Medical Record Number: [id number] Patient Account Number: [id number] Date of Birth/Sex: [birth year] (86 y.o. M) Treating RN: Army Melia Primary Care Provider: Caryl Pina Other Clinician: Referring Provider: Caryl Pina Treating Provider/Extender: Melburn Hake, Tamalyn Wadsworth Weeks in Treatment: 4 History of Present Illness HPI Description: 02/03/2019 patient presents today for initial evaluation in our clinic concerning issues that he has been having with wounds on his right heel as well as the right fourth and fifth toes. The toe ulcers have been present for about 5 months. They are pretty much just dry and eschar covered at this point but appear to be stable. The heel ulcer has been present for about 2 months and is very soft the eschar is loosening and there appears to be purulent drainage as well noted at this point there is also erythema surrounding the wound bed. I am unsure if this is just inflammation or secondary to infection to be perfectly honest. With that being said  the patient has recently gotten Prevalon offloading boots and he is essentially nonambulatory at this point. He did have a coronary artery bypass graft x4 in 1989. Otherwise he tells me that he also has hypertension and urinary incontinence. Obviously that is not playing any role in his wounds currently based on what we are seeing today. 02/10/2019 on evaluation today patient appears to be doing quite well with regard to his wounds. He is not showing a lot of signs of improvement yet but he still has a lot of eschar at this point. He still awaiting the vascular evaluation at this time as well. Fortunately there is no signs of active infection. No fevers, chills, nausea, vomiting, or diarrhea. 02/17/2019 on evaluation today patient appears to be doing quite well with regard to his wound. This seems to be greatly improved compared to last visit I do believe that the cefdinir is doing well for him. Fortunately there is no signs of active infection at this time. No fever chills noted no fevers, chills, nausea, vomiting, or diarrhea. 03/03/2019 on evaluation today patient appears to be doing unfortunately worse even compared to his last visit. His wounds have not progressed in a good way and unfortunately he has multiple new locations on the left foot as well that are giving him trouble. I am obviously concerned about how things are progressing at this point. I did actually have results to review for him today. This included his arterial study which revealed that his ABIs were not obtainable and subsequently his TBI's were very low in the 0.35-0.30 range bilaterally. Subsequently his x-ray also revealed that there are potential changes on the x-ray at the heel on the right that could be associated with osteomyelitis. Nonetheless I do believe that the patient does get a likely require a vascular consult as well as  an MRI to further evaluate the situation. In the meantime I am also going to recommend  refilling the Omnicef to continue treatment as such since that does seem to have been at least somewhat beneficial for him. Electronic Signature(s) Signed: 03/07/2019 10:10:12 AM By: Worthy Keeler PA-C Previous Signature: 03/04/2019 9:05:09 AM Version By: Worthy Keeler PA-C Entered By: Worthy Keeler on 03/07/2019 10:10:12 Robak, [name] ([id number]) -------------------------------------------------------------------------------- Physical Exam Details Patient Name: [name], [name]. Date of Service: 03/03/2019 1:30 PM Medical Record Number: [id number] Patient Account Number: [id number] Date of Birth/Sex: January 04, 1931 (86 y.o. M) Treating RN: Army Melia Primary Care Provider: Caryl Pina Other Clinician: Referring Provider: Caryl Pina Treating Provider/Extender: STONE III, Jamaiyah Pyle Weeks in Treatment: 4 Constitutional Well-nourished and well-hydrated in no acute distress. Respiratory normal breathing without difficulty. Psychiatric this patient is able to make decisions and demonstrates good insight into disease process. Alert and Oriented x 3. pleasant and cooperative. Notes Upon inspection today patient's wounds unfortunately appear to be at least the same if not slightly worse and again he has new ulcers on the toes of the left foot. Obviously all of this is not good. 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 Obviously there is pressure components at many areas as well that have led to what we are seeing. Nonetheless no sharp debridement was performed at any site at the moment. Obviously this would not be indicated based on the patient's ABIs. Electronic Signature(s) Signed: 03/04/2019 9:06:02 AM By: Worthy Keeler PA-C Entered By: Worthy Keeler on 03/04/2019 09:06:02 Gaumer, [name]  ([id number]) -------------------------------------------------------------------------------- Physician Orders Details Patient Name: DAYLYN, AZBILL. Date of Service: 03/03/2019 1:30 PM Medical Record Number: [id number] Patient Account Number: [id number] Date of Birth/Sex: 1930-11-03 (86 y.o. M) Treating RN: Army Melia Primary Care Provider: Caryl Pina Other Clinician: Referring Provider: Caryl Pina Treating Provider/Extender: Melburn Hake, Quin Mathenia Weeks in Treatment: 4 Verbal / Phone Orders: No Diagnosis Coding ICD-10 Coding Code Description L89.610 Pressure ulcer of right heel, unstageable L89.890 Pressure ulcer of other site, unstageable E11.622 Type 2 diabetes mellitus with other skin ulcer L97.522 Non-pressure chronic ulcer of other part of left foot with fat layer exposed L97.512 Non-pressure chronic ulcer of other part of right foot with fat layer exposed I73.89 Other specified peripheral vascular diseases I25.10 Atherosclerotic heart disease of native coronary artery without angina pectoris I10 Essential (primary) hypertension N39.498 Other specified urinary incontinence Wound Cleansing Wound #1 Right Calcaneus o Clean wound with Normal Saline. - in office o Cleanse wound with mild soap and water Wound #2 Right Toe Fourth o Clean wound with Normal Saline. - in office o Cleanse wound with mild soap and water Wound #3 Right Toe Fifth o Clean wound with Normal Saline. - in office o Cleanse wound with mild soap and water Wound #4 Left,Lateral Toe Third o Clean wound with Normal Saline. - in office o Cleanse wound with mild soap and water Wound #5 Left,Medial Toe Fourth o Clean wound with Normal Saline. - in office o Cleanse wound with mild soap and water Wound #6 Left,Lateral Toe Fourth o Clean wound with Normal Saline. - in office o Cleanse wound with mild soap and water Wound #7 Left,Medial Toe Fifth o Clean wound with Normal Saline.  - in office o Cleanse wound with mild soap and water Primary Wound  Dressing NYLAN, NEVEL ([id number]) Wound #1 Right Calcaneus o Iodoflex Wound #2 Right Toe Fourth o Other: - betadine Wound #3 Right Toe Fifth o Other: - betadine Wound #4 Left,Lateral Toe Third o Other: - betadine Wound #5 Left,Medial Toe Fourth o Other: - betadine Wound #6 Left,Lateral Toe Fourth o Other: - betadine Wound #7 Left,Medial Toe Fifth o Other: - betadine Secondary Dressing Wound #1 Right Calcaneus o Conform/Kerlix o Other - heel cup Dressing Change Frequency Wound #1 Right Calcaneus o Change Dressing Tuesday and Thursday. - Tuesday by [facility] Wound #2 Right Toe Fourth o Change Dressing Tuesday and Thursday. - Tuesday by [facility] Wound #3 Right Toe Fifth o Change Dressing Tuesday and Thursday. - Tuesday by [facility] Wound #4 Left,Lateral Toe Third o Change Dressing Tuesday and Thursday. - Tuesday by [facility] Wound #5 Left,Medial Toe Fourth o Change Dressing Tuesday and Thursday. - Tuesday by [facility] Wound #6 Left,Lateral Toe Fourth o Change Dressing Tuesday and Thursday. - Tuesday by [facility] Wound #7 Left,Medial Toe Fifth o Change Dressing Tuesday and Thursday. - Tuesday by [facility] Follow-up Appointments Wound #1 Right Calcaneus o Return Appointment in 1 week. Wound #2 Right Toe Fourth o Return Appointment in 1 week. Wound #3 Right Toe Fifth AYOUB, AREY ([id number]) o Return Appointment in 1 week. Wound #4 Left,Lateral Toe Third o Return Appointment in 1 week. Wound #5 Left,Medial Toe Fourth o Return Appointment in 1 week. Wound #6 Left,Lateral Toe Fourth o Return Appointment in 1 week. Wound #7 Left,Medial Toe Fifth o Return Appointment in 1 week. Home Health Wound #1 Right [place] Visits - [place] Nurse may visit PRN to address patientos wound care needs. o FACE TO FACE ENCOUNTER: MEDICARE and MEDICAID PATIENTS: I  certify that this patient is under my care and that I had a face-to-face encounter that meets the physician face-to-face encounter requirements with this patient on this date. The encounter with the patient was in whole or in part for the following MEDICAL CONDITION: (primary reason for [place]) MEDICAL NECESSITY: I certify, that based on my findings, NURSING services are a medically necessary home health service. HOME BOUND STATUS: I certify that my clinical findings support that this patient is homebound (i.e., Due to illness or injury, pt requires aid of supportive devices such as crutches, cane, wheelchairs, walkers, the use of special transportation or the assistance of another person to leave their place of residence. There is a normal inability to leave the home and doing so requires considerable and taxing effort. Other absences are for medical reasons / religious services and are infrequent or of short duration when for other reasons). o If current dressing causes regression in wound condition, may D/C ordered dressing product/s and apply Normal Saline Moist Dressing daily until next [place] / Other MD appointment. [place] of regression in wound condition at [phone]. o Please direct any NON-WOUND related issues/requests for orders to patient's Primary Care Physician Wound #2 Right Toe [place] Visits - [place] Nurse may visit PRN to address patientos wound care needs. o FACE TO FACE ENCOUNTER: MEDICARE and MEDICAID PATIENTS: I certify that this patient is under my care and that I had a face-to-face encounter that meets the physician face-to-face encounter requirements with this patient on this date. The encounter with the patient was in whole or in part for the following MEDICAL CONDITION: (primary reason for [place]) MEDICAL NECESSITY: I  certify, that based on my findings, NURSING  services are a medically necessary home health service. HOME BOUND STATUS: I certify that my clinical findings support that this patient is homebound (i.e., Due to illness or injury, pt requires aid of supportive devices such as crutches, cane, wheelchairs, walkers, the use of special transportation or the assistance of another person to leave their place of residence. There is a normal inability to leave the home and doing so requires considerable and taxing effort. Other absences are for medical reasons / religious services and are infrequent or of short duration when for other reasons). o If current dressing causes regression in wound condition, may D/C ordered dressing product/s and apply Normal Saline Moist Dressing daily until next [place] / Other MD appointment. [place] of regression in wound condition at [phone]. o Please direct any NON-WOUND related issues/requests for orders to patient's Primary Care Physician Wound #3 Right Toe [place] Visits - [place] Nurse may visit PRN to address patientos wound care needs. o FACE TO FACE ENCOUNTER: MEDICARE and MEDICAID PATIENTS: I certify that this patient is under my care and that I had a face-to-face encounter that meets the physician face-to-face encounter requirements with this patient on this date. The encounter with the patient was in whole or in part for the following MEDICAL CONDITION: (primary reason for [place]) MEDICAL NECESSITY: I certify, that based on my findings, [place], [place] ([id number]) NURSING services are a medically necessary home health service. HOME BOUND STATUS: I certify that my clinical findings support that this patient is homebound (i.e., Due to illness or injury, pt requires aid of supportive devices such as crutches, cane, wheelchairs, walkers, the use of special transportation or the assistance of another person to leave  their place of residence. There is a normal inability to leave the home and doing so requires considerable and taxing effort. Other absences are for medical reasons / religious services and are infrequent or of short duration when for other reasons). o If current dressing causes regression in wound condition, may D/C ordered dressing product/s and apply Normal Saline Moist Dressing daily until next [place] / Other MD appointment. [place] of regression in wound condition at [phone]. o Please direct any NON-WOUND related issues/requests for orders to patient's Primary Care Physician Wound #4 Left,Lateral Toe [place] Visits - [place] Nurse may visit PRN to address patientos wound care needs. o FACE TO FACE ENCOUNTER: MEDICARE and MEDICAID PATIENTS: I certify that this patient is under my care and that I had a face-to-face encounter that meets the physician face-to-face encounter requirements with this patient on this date. The encounter with the patient was in whole or in part for the following MEDICAL CONDITION: (primary reason for [place]) MEDICAL NECESSITY: I certify, that based on my findings, NURSING services are a medically necessary home health service. HOME BOUND STATUS: I certify that my clinical findings support that this patient is homebound (i.e., Due to illness or injury, pt requires aid of supportive devices such as crutches, cane, wheelchairs, walkers, the use of special transportation or the assistance of another person to leave their place of residence. There is a normal inability to leave the home and doing so requires considerable and taxing effort. Other absences are for medical reasons / religious services and are infrequent or of short duration when for other reasons). o If current dressing  causes regression in wound condition, may D/C ordered dressing product/s and apply Normal  Saline Moist Dressing daily until next [place] / Other MD appointment. [place] of regression in wound condition at [phone]. o Please direct any NON-WOUND related issues/requests for orders to patient's Primary Care Physician Wound #5 Left,Medial Toe [place] Visits - [place] Nurse may visit PRN to address patientos wound care needs. o FACE TO FACE ENCOUNTER: MEDICARE and MEDICAID PATIENTS: I certify that this patient is under my care and that I had a face-to-face encounter that meets the physician face-to-face encounter requirements with this patient on this date. The encounter with the patient was in whole or in part for the following MEDICAL CONDITION: (primary reason for [place]) MEDICAL NECESSITY: I certify, that based on my findings, NURSING services are a medically necessary home health service. HOME BOUND STATUS: I certify that my clinical findings support that this patient is homebound (i.e., Due to illness or injury, pt requires aid of supportive devices such as crutches, cane, wheelchairs, walkers, the use of special transportation or the assistance of another person to leave their place of residence. There is a normal inability to leave the home and doing so requires considerable and taxing effort. Other absences are for medical reasons / religious services and are infrequent or of short duration when for other reasons). o If current dressing causes regression in wound condition, may D/C ordered dressing product/s and apply Normal Saline Moist Dressing daily until next [place] / Other MD appointment. [place] of regression in wound condition at [phone]. o Please direct any NON-WOUND related issues/requests for orders to patient's Primary Care Physician Wound #6 Left,Lateral Toe [place] Visits - [place] Nurse may  visit PRN to address patientos wound care needs. o FACE TO FACE ENCOUNTER: MEDICARE and MEDICAID PATIENTS: I certify that this patient is under my care and that I had a face-to-face encounter that meets the physician face-to-face encounter requirements with this patient on this date. The encounter with the patient was in whole or in part for the following MEDICAL CONDITION: (primary reason for [place]) MEDICAL NECESSITY: I certify, that based on my findings, NURSING services are a medically necessary home health service. HOME BOUND STATUS: I certify that my clinical findings support that this patient is homebound (i.e., Due to illness or injury, pt requires aid of supportive devices such as crutches, cane, wheelchairs, walkers, the use of special transportation or the assistance of another person to leave their place of residence. There is a normal inability to leave the home KENICHI, CASSADA. ([id number]) and doing so requires considerable and taxing effort. Other absences are for medical reasons / religious services and are infrequent or of short duration when for other reasons). o If current dressing causes regression in wound condition, may D/C ordered dressing product/s and apply Normal Saline Moist Dressing daily until next [place] / Other MD appointment. [place] of regression in wound condition at [phone]. o Please direct any NON-WOUND related issues/requests for orders to patient's Primary Care Physician Wound #7 Left,Medial Toe [place] Visits - [place] Nurse may visit PRN to address patientos wound care needs. o FACE TO FACE ENCOUNTER: MEDICARE and MEDICAID PATIENTS: I certify that this patient is under my care and that I had a face-to-face encounter that meets the physician face-to-face  encounter requirements with this patient on this date. The encounter with the patient was in whole or in part  for the following MEDICAL CONDITION: (primary reason for [place]) MEDICAL NECESSITY: I certify, that based on my findings, NURSING services are a medically necessary home health service. HOME BOUND STATUS: I certify that my clinical findings support that this patient is homebound (i.e., Due to illness or injury, pt requires aid of supportive devices such as crutches, cane, wheelchairs, walkers, the use of special transportation or the assistance of another person to leave their place of residence. There is a normal inability to leave the home and doing so requires considerable and taxing effort. Other absences are for medical reasons / religious services and are infrequent or of short duration when for other reasons). o If current dressing causes regression in wound condition, may D/C ordered dressing product/s and apply Normal Saline Moist Dressing daily until next [place] / Other MD appointment. [place] of regression in wound condition at [phone]. o Please direct any NON-WOUND related issues/requests for orders to patient's Primary Care Physician Consults o Vascular - Vascular consult d/t ABI results Radiology o Magnetic Resonance Imaging (MRI) - Right foot with/without contrast Patient Medications Allergies: Sulfa (Sulfonamide Antibiotics), aspirin Notifications Medication Indication Start End cefdinir 03/04/2019 DOSE 1 - oral 300 mg capsule - 1 capsule oral taken 2 times a day for 14 days Electronic Signature(s) Signed: 03/04/2019 9:08:44 AM By: Worthy Keeler PA-C Previous Signature: 03/03/2019 5:01:42 PM Version By: Army Melia Entered By: Worthy Keeler on 03/04/2019 09:08:44 Kumari, [name] ([id number]) -------------------------------------------------------------------------------- Problem List Details Patient Name: [name], [name]. Date of Service: 03/03/2019 1:30 PM Medical Record Number: [id number] Patient Account Number:  [id number] Date of Birth/Sex: 22-Aug-1930 (86 y.o. M) Treating RN: Army Melia Primary Care Provider: Caryl Pina Other Clinician: Referring Provider: Caryl Pina Treating Provider/Extender: Melburn Hake, Dene Nazir Weeks in Treatment: 4 Active Problems ICD-10 Evaluated Encounter Code Description Active Date Today Diagnosis L89.610 Pressure ulcer of right heel, unstageable 02/03/2019 No Yes L89.890 Pressure ulcer of other site, unstageable 02/03/2019 No Yes E11.622 Type 2 diabetes mellitus with other skin ulcer 03/03/2019 No Yes L97.522 Non-pressure chronic ulcer of other part of left foot with fat 03/03/2019 No Yes layer exposed L97.512 Non-pressure chronic ulcer of other part of right foot with fat 03/03/2019 No Yes layer exposed I73.89 Other specified peripheral vascular diseases 03/03/2019 No Yes I25.10 Atherosclerotic heart disease of native coronary artery 02/03/2019 No Yes without angina pectoris I10 Essential (primary) hypertension 02/03/2019 No Yes N39.498 Other specified urinary incontinence 02/03/2019 No Yes Inactive Problems LUKE, FALERO ([id number]) Resolved Problems Electronic Signature(s) Signed: 03/03/2019 2:16:33 PM By: Worthy Keeler PA-C Entered By: Worthy Keeler on 03/03/2019 14:16:32 [place], [name] ([id number]) -------------------------------------------------------------------------------- Progress Note Details Patient Name: [name]. Date of Service: 03/03/2019 1:30 PM Medical Record Number: [id number] Patient Account Number: [id number] Date of Birth/Sex: 1930-07-21 (86 y.o. M) Treating RN: Army Melia Primary Care Provider: Caryl Pina Other Clinician: Referring Provider: Caryl Pina Treating Provider/Extender: Melburn Hake, Tywana Robotham Weeks in Treatment: 4 Subjective Chief Complaint Information obtained from Patient Heel pressure ulcer and right 4th and 5th toe pressure ulcers along with multiple left toe ulcers History of Present Illness  (HPI) 02/03/2019 patient presents today for initial evaluation in our clinic concerning issues that he has been having with wounds on his right heel as well as the right fourth and fifth toes. The toe ulcers have been present for about  5 months. They are pretty much just dry and eschar covered at this point but appear to be stable. The heel ulcer has been present for about 2 months and is very soft the eschar is loosening and there appears to be purulent drainage as well noted at this point there is also erythema surrounding the wound bed. I am unsure if this is just inflammation or secondary to infection to be perfectly honest. With that being said the patient has recently gotten Prevalon offloading boots and he is essentially nonambulatory at this point. He did have a coronary artery bypass graft x4 in 1989. Otherwise he tells me that he also has hypertension and urinary incontinence. Obviously that is not playing any role in his wounds currently based on what we are seeing today. 02/10/2019 on evaluation today patient appears to be doing quite well with regard to his wounds. He is not showing a lot of signs of improvement yet but he still has a lot of eschar at this point. He still awaiting the vascular evaluation at this time as well. Fortunately there is no signs of active infection. No fevers, chills, nausea, vomiting, or diarrhea. 02/17/2019 on evaluation today patient appears to be doing quite well with regard to his wound. This seems to be greatly improved compared to last visit I do believe that the cefdinir is doing well for him. Fortunately there is no signs of active infection at this time. No fever chills noted no fevers, chills, nausea, vomiting, or diarrhea. 03/03/2019 on evaluation today patient appears to be doing unfortunately worse even compared to his last visit. His wounds have not progressed in a good way and unfortunately he has multiple new locations on the left foot as well  that are giving him trouble. I am obviously concerned about how things are progressing at this point. I did actually have results to review for him today. This included his arterial study which revealed that his ABIs were not obtainable and subsequently his TBI's were very low in the 0.35-0.30 range bilaterally. Subsequently his x-ray also revealed that there are potential changes on the x-ray at the heel on the right that could be associated with osteomyelitis. Nonetheless I do believe that the patient does get a likely require a vascular consult as well as an MRI to further evaluate the situation. In the meantime I am also going to recommend refilling the Omnicef to continue treatment as such since that does seem to have been at least somewhat beneficial for him. Objective Constitutional Well-nourished and well-hydrated in no acute distress. Vitals Time Taken: 1:47 PM, Height: 68 in, Weight: 235 lbs, BMI: 35.7, Temperature: 97.8 F, Pulse: 92 bpm, Respiratory Rate: 16 breaths/min, Blood Pressure: 118/50 mmHg. ZADIN, LANGE ([id number]) Respiratory normal breathing without difficulty. Psychiatric this patient is able to make decisions and demonstrates good insight into disease process. Alert and Oriented x 3. pleasant and cooperative. General Notes: Upon inspection today patient's wounds unfortunately appear to be at least the same if not slightly worse and again he has new ulcers on the toes of the left foot. Obviously all of this is not good. 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 Obviously there is pressure components at many areas as well that have led to what we are seeing. Nonetheless no sharp debridement was performed at any site at  the moment. Obviously this would not be indicated based on the patient's ABIs. Integumentary  (Hair, Skin) Wound #1 status is Open. Original cause of wound was Gradually Appeared. The wound is located on the Right Calcaneus. The wound measures 5.5cm length x 3.8cm width x 0.5cm depth; 16.415cm^2 area and 8.207cm^3 volume. There is Fat Layer (Subcutaneous Tissue) Exposed exposed. There is no tunneling or undermining noted. There is a medium amount of serosanguineous drainage noted. The wound margin is flat and intact. There is no granulation within the wound bed. There is a large (67-100%) amount of necrotic tissue within the wound bed including Eschar and Adherent Slough. Wound #2 status is Open. Original cause of wound was Gradually Appeared. The wound is located on the Right Toe Fourth. The wound measures 0.2cm length x 0.3cm width x 0.1cm depth; 0.047cm^2 area and 0.005cm^3 volume. There is Fat Layer (Subcutaneous Tissue) Exposed exposed. There is no tunneling or undermining noted. There is a medium amount of serous drainage noted. The wound margin is flat and intact. There is no granulation within the wound bed. There is a large (67-100%) amount of necrotic tissue within the wound bed including Eschar. Wound #3 status is Open. Original cause of wound was Pressure Injury. The wound is located on the Right Toe Fifth. The wound measures 0.8cm length x 0.4cm width x 0.1cm depth; 0.251cm^2 area and 0.025cm^3 volume. There is Fat Layer (Subcutaneous Tissue) Exposed exposed. There is no tunneling or undermining noted. There is a medium amount of serous drainage noted. The wound margin is flat and intact. There is no granulation within the wound bed. There is a large (67-100%) amount of necrotic tissue within the wound bed including Eschar. Wound #4 status is Open. Original cause of wound was Gradually Appeared. The wound is located on the Left,Lateral Toe Third. The wound measures 0.4cm length x 0.3cm width x 0.1cm depth; 0.094cm^2 area and 0.009cm^3 volume. There is Fat Layer (Subcutaneous  Tissue) Exposed exposed. There is no tunneling or undermining noted. There is a medium amount of purulent drainage noted. The wound margin is flat and intact. There is no granulation within the wound bed. There is a large (67-100%) amount of necrotic tissue within the wound bed including Eschar and Adherent Slough. Wound #5 status is Open. Original cause of wound was Gradually Appeared. The wound is located on the Left,Medial Toe Fourth. The wound measures 0.3cm length x 0.7cm width x 0.1cm depth; 0.165cm^2 area and 0.016cm^3 volume. There is Fat Layer (Subcutaneous Tissue) Exposed exposed. There is no tunneling or undermining noted. There is a medium amount of purulent drainage noted. The wound margin is flat and intact. There is small (1-33%) pink granulation within the wound bed. There is a large (67-100%) amount of necrotic tissue within the wound bed including Eschar and Adherent Slough. Wound #6 status is Open. Original cause of wound was Gradually Appeared. The wound is located on the Left,Lateral Toe Fourth. The wound measures 1.2cm length x 1.3cm width x 0.2cm depth; 1.225cm^2 area and 0.245cm^3 volume. There is Fat Layer (Subcutaneous Tissue) Exposed exposed. There is no tunneling or undermining noted. There is a medium amount of purulent drainage noted. The wound margin is flat and intact. There is no granulation within the wound bed. There is a large (67-100%) amount of necrotic tissue within the wound bed including Eschar and Adherent Slough. Wound #7 status is Open. Original cause of wound was Gradually Appeared. The wound is located on the Left,Medial Toe Fifth. The wound  measures 1.2cm length x 1.1cm width x 0.1cm depth; 1.037cm^2 area and 0.104cm^3 volume. There is Fat Layer (Subcutaneous Tissue) Exposed exposed. There is no tunneling or undermining noted. There is a medium amount of purulent drainage noted. The wound margin is flat and intact. There is no granulation within the  wound bed. There is a large (67-100%) amount of necrotic tissue within the wound bed including Eschar and Adherent Slough. KEITHAN, DILEONARDO ([id number]) Other Condition(s) Patient presents with Suspected Deep Tissue Injury located on the Bilateral Foot. 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 - in office Cleanse wound with mild soap and water Wound #2 Right Toe Fourth: Clean wound with Normal Saline. - in office Cleanse wound with mild soap and water Wound #3 Right Toe Fifth: Clean wound with Normal Saline. - in office Cleanse wound with mild soap and water Wound #4 Left,Lateral Toe Third: Clean wound with Normal Saline. - in office Cleanse wound with mild soap and water Wound #5 Left,Medial Toe Fourth: Clean wound with Normal Saline. - in office Cleanse wound with mild soap and water Wound #6 Left,Lateral Toe Fourth: Clean wound with Normal Saline. - in office Cleanse wound with mild soap and water Wound #7 Left,Medial Toe Fifth: Clean wound with Normal Saline. - in office Cleanse wound with mild soap and water Primary Wound Dressing: Wound #1 Right Calcaneus: Iodoflex Wound #2 Right Toe Fourth: Other: - betadine Wound #3 Right Toe Fifth: Other: - betadine Wound #4 Left,Lateral Toe ThirdGABRIAL, DOMINE ([id number]) Other: - betadine Wound #5 Left,Medial Toe Fourth: Other: - betadine Wound #6 Left,Lateral Toe  Fourth: Other: - betadine Wound #7 Left,Medial Toe Fifth: Other: - betadine Secondary Dressing: Wound #1 Right Calcaneus: Conform/Kerlix Other - heel cup Dressing Change Frequency: Wound #1 Right Calcaneus: Change Dressing Tuesday and Thursday. - Tuesday by [facility] Wound #2 Right Toe Fourth: Change Dressing Tuesday and Thursday. - Tuesday by [facility] Wound #3 Right Toe Fifth: Change Dressing Tuesday and Thursday. - Tuesday by [facility] Wound #4 Left,Lateral Toe Third: Change Dressing Tuesday and Thursday. - Tuesday by [facility] Wound #5 Left,Medial Toe Fourth: Change Dressing Tuesday and Thursday. - Tuesday by [facility] Wound #6 Left,Lateral Toe Fourth: Change Dressing Tuesday and Thursday. - Tuesday by [facility] Wound #7 Left,Medial Toe Fifth: Change Dressing Tuesday and Thursday. - Tuesday by [facility] Follow-up Appointments: Wound #1 Right Calcaneus: Return Appointment in 1 week. Wound #2 Right Toe Fourth: Return Appointment in 1 week. Wound #3 Right Toe Fifth: Return Appointment in 1 week. Wound #4 Left,Lateral Toe Third: Return Appointment in 1 week. Wound #5 Left,Medial Toe Fourth: Return Appointment in 1 week. Wound #6 Left,Lateral Toe Fourth: Return Appointment in 1 week. Wound #7 Left,Medial Toe Fifth: Return Appointment in 1 week. Home Health: Wound #1 Right Calcaneus: Continue Home Health Visits - [place] Nurse may visit PRN to address patient s wound care needs. FACE TO FACE ENCOUNTER: MEDICARE and MEDICAID PATIENTS: I certify that this patient is under my care and that I had a face-to-face encounter that meets the physician  face-to-face encounter requirements with this patient on this date. The encounter with the patient was in whole or in part for the following MEDICAL CONDITION: (primary reason for [place]) MEDICAL NECESSITY: I certify, that based on my findings, NURSING services are a medically necessary home health service. HOME BOUND STATUS: I certify that my clinical findings  support that this patient is homebound (i.e., Due to illness or injury, pt requires aid of supportive devices such as crutches, cane, wheelchairs, walkers, the use of special transportation or the assistance of another person to leave their place of residence. There is a normal inability to leave the home and doing so requires considerable and taxing effort. Other absences are for medical reasons / religious services and are infrequent or of short duration when for other reasons). If current dressing causes regression in wound condition, may D/C ordered dressing product/s and apply Normal Saline Moist Dressing daily until next [place] / Other MD appointment. [place] of regression in wound condition at [phone]. Please direct any NON-WOUND related issues/requests for orders to patient's Primary Care Physician Wound #2 Right Toe Fourth: [place] Visits - CAIDYN, HENRICKSEN ([id number]) [place] Nurse may visit PRN to address patient s wound care needs. FACE TO FACE ENCOUNTER: MEDICARE and MEDICAID PATIENTS: I certify that this patient is under my care and that I had a face-to-face encounter that meets the physician face-to-face encounter requirements with this patient on this date. The encounter with the patient was in whole or in part for the following MEDICAL CONDITION: (primary reason for [place]) MEDICAL NECESSITY: I certify, that based on my findings, NURSING services are a medically necessary home health service. HOME BOUND STATUS: I certify that my clinical findings support that this patient is homebound (i.e., Due to illness or injury, pt requires aid of supportive devices such as crutches, cane, wheelchairs, walkers, the use of special transportation or the assistance of another person to leave their place of residence. There is a normal inability to leave the home and doing so requires considerable and taxing effort. Other  absences are for medical reasons / religious services and are infrequent or of short duration when for other reasons). If current dressing causes regression in wound condition, may D/C ordered dressing product/s and apply Normal Saline Moist Dressing daily until next [place] / Other MD appointment. [place] of regression in wound condition at [phone]. Please direct any NON-WOUND related issues/requests for orders to patient's Primary Care Physician Wound #3 Right Toe Fifth: [place] Visits - [place] Nurse may visit PRN to address patient s wound care needs. FACE TO FACE ENCOUNTER: MEDICARE and MEDICAID PATIENTS: I certify that this patient is under my care and that I had a face-to-face encounter that meets the physician face-to-face encounter requirements with this patient on this date. The encounter with the patient was in whole or in part for the following MEDICAL CONDITION: (primary reason for [place]) MEDICAL NECESSITY: I certify, that based on my findings, NURSING services are a medically necessary home health service. HOME BOUND STATUS: I certify that my clinical findings support that this patient is homebound (i.e., Due to illness or injury, pt requires aid of supportive devices such as crutches, cane, wheelchairs, walkers, the use of special transportation or the assistance of another person to leave their place of residence. There is a normal inability to leave the home and doing so requires considerable and taxing  effort. Other absences are for medical reasons / religious services and are infrequent or of short duration when for other reasons). If current dressing causes regression in wound condition, may D/C ordered dressing product/s and apply Normal Saline Moist Dressing daily until next [place] / Other MD appointment. [place] of regression in wound condition at  [phone]. Please direct any NON-WOUND related issues/requests for orders to patient's Primary Care Physician Wound #4 Left,Lateral Toe Third: [place] Visits - [place] Nurse may visit PRN to address patient s wound care needs. FACE TO FACE ENCOUNTER: MEDICARE and MEDICAID PATIENTS: I certify that this patient is under my care and that I had a face-to-face encounter that meets the physician face-to-face encounter requirements with this patient on this date. The encounter with the patient was in whole or in part for the following MEDICAL CONDITION: (primary reason for [place]) MEDICAL NECESSITY: I certify, that based on my findings, NURSING services are a medically necessary home health service. HOME BOUND STATUS: I certify that my clinical findings support that this patient is homebound (i.e., Due to illness or injury, pt requires aid of supportive devices such as crutches, cane, wheelchairs, walkers, the use of special transportation or the assistance of another person to leave their place of residence. There is a normal inability to leave the home and doing so requires considerable and taxing effort. Other absences are for medical reasons / religious services and are infrequent or of short duration when for other reasons). If current dressing causes regression in wound condition, may D/C ordered dressing product/s and apply Normal Saline Moist Dressing daily until next [place] / Other MD appointment. [place] of regression in wound condition at [phone]. Please direct any NON-WOUND related issues/requests for orders to patient's Primary Care Physician Wound #5 Left,Medial Toe Fourth: [place] Visits - [place] Nurse may visit PRN to address patient s wound care needs. FACE TO FACE ENCOUNTER: MEDICARE and MEDICAID PATIENTS: I certify that this patient is under my care and that I had a face-to-face  encounter that meets the physician face-to-face encounter requirements with this patient on this date. The encounter with the patient was in whole or in part for the following MEDICAL CONDITION: (primary reason for [place]) MEDICAL NECESSITY: I certify, that based on my findings, NURSING services are a medically necessary home health service. HOME BOUND STATUS: I certify that my clinical findings support that this patient is homebound (i.e., Due to illness or injury, pt requires aid of supportive devices such as crutches, cane, wheelchairs, walkers, the use of special transportation or the assistance of another person to leave their place of residence. There is a normal inability to leave the home and doing so requires considerable and taxing effort. Other absences are for medical reasons / religious services and are infrequent or of short duration when for other reasons). If current dressing causes regression in wound condition, may D/C ordered dressing product/s and apply Normal Saline Moist Dressing daily until next [place] / Other MD appointment. [place] of regression in JAYSTEN, ESSNER. ([id number]) wound condition at [phone]. Please direct any NON-WOUND related issues/requests for orders to patient's Primary Care Physician Wound #6 Left,Lateral Toe Fourth: [place] Visits - [place] Nurse may visit PRN to address patient s wound care needs. FACE TO FACE ENCOUNTER: MEDICARE and MEDICAID PATIENTS: I certify that this patient is under my  care and that I had a face-to-face encounter that meets the physician face-to-face encounter requirements with this patient on this date. The encounter with the patient was in whole or in part for the following MEDICAL CONDITION: (primary reason for [place]) MEDICAL NECESSITY: I certify, that based on my findings, NURSING services are a medically necessary home health service. HOME  BOUND STATUS: I certify that my clinical findings support that this patient is homebound (i.e., Due to illness or injury, pt requires aid of supportive devices such as crutches, cane, wheelchairs, walkers, the use of special transportation or the assistance of another person to leave their place of residence. There is a normal inability to leave the home and doing so requires considerable and taxing effort. Other absences are for medical reasons / religious services and are infrequent or of short duration when for other reasons). If current dressing causes regression in wound condition, may D/C ordered dressing product/s and apply Normal Saline Moist Dressing daily until next [place] / Other MD appointment. [place] of regression in wound condition at [phone]. Please direct any NON-WOUND related issues/requests for orders to patient's Primary Care Physician Wound #7 Left,Medial Toe Fifth: [place] Visits - [place] Nurse may visit PRN to address patient s wound care needs. FACE TO FACE ENCOUNTER: MEDICARE and MEDICAID PATIENTS: I certify that this patient is under my care and that I had a face-to-face encounter that meets the physician face-to-face encounter requirements with this patient on this date. The encounter with the patient was in whole or in part for the following MEDICAL CONDITION: (primary reason for [place]) MEDICAL NECESSITY: I certify, that based on my findings, NURSING services are a medically necessary home health service. HOME BOUND STATUS: I certify that my clinical findings support that this patient is homebound (i.e., Due to illness or injury, pt requires aid of supportive devices such as crutches, cane, wheelchairs, walkers, the use of special transportation or the assistance of another person to leave their place of residence. There is a normal inability to leave the home and doing so requires considerable  and taxing effort. Other absences are for medical reasons / religious services and are infrequent or of short duration when for other reasons). If current dressing causes regression in wound condition, may D/C ordered dressing product/s and apply Normal Saline Moist Dressing daily until next [place] / Other MD appointment. [place] of regression in wound condition at [phone]. Please direct any NON-WOUND related issues/requests for orders to patient's Primary Care Physician Radiology ordered were: Magnetic Resonance Imaging (MRI) - Right foot with/without contrast Consults ordered were: Vascular - Vascular consult d/t ABI results The following medication(s) was prescribed: cefdinir oral 300 mg capsule 1 1 capsule oral taken 2 times a day for 14 days starting 03/04/2019 1. Based on what I am seeing at this point I did go ahead and make a vascular consult for the patient today I do believe this is going to be of utmost importance. 2. Also did go ahead and order an MRI of the right foot to evaluate for osteomyelitis of the heel in order to determine whether or not this is indeed the case or not. 3. I am also get a go ahead and refill the [facility] for the patient. I do believe that he needs to still continue to be on the antibiotics especially in light of the fact he does not even seem to be getting better.  We need to make sure that he is continuing to be treated as such until we can get him headed in a better direction. Obviously if it does turn out he has osteomyelitis I will likely go ahead and make a referral to infectious disease as well. 4. The patient still needs to continue to elevate his leg and prevent pressure to the foot and heel locations obviously I think that is also very important as far as healing and prevention is concerned. We will see patient back for reevaluation in 1 week here in the clinic. If anything worsens or changes patient will contact  our office for additional recommendations. CESAREO, VICKREY ([id number]) Electronic Signature(s) Signed: 03/07/2019 10:11:12 AM By: Worthy Keeler PA-C Previous Signature: 03/04/2019 9:12:17 AM Version By: Worthy Keeler PA-C Entered By: Worthy Keeler on 03/07/2019 10:11:12 Markie, [name] ([id number]) -------------------------------------------------------------------------------- SuperBill Details Patient Name: KINGDAVID, [name]. Date of Service: 03/03/2019 Medical Record Number: [id number] Patient Account Number: [id number] Date of Birth/Sex: April 20, 1930 (86 y.o. M) Treating RN: Army Melia Primary Care Provider: Caryl Pina Other Clinician: Referring Provider: Caryl Pina Treating Provider/Extender: Melburn Hake, Mckinzy Fuller Weeks in Treatment: 4 Diagnosis Coding ICD-10 Codes Code Description L89.610 Pressure ulcer of right heel, unstageable L89.890 Pressure ulcer of other site, unstageable E11.622 Type 2 diabetes mellitus with other skin ulcer L97.522 Non-pressure chronic ulcer of other part of left foot with fat layer exposed L97.512 Non-pressure chronic ulcer of other part of right foot with fat layer exposed I73.89 Other specified peripheral vascular diseases I25.10 Atherosclerotic heart disease of native coronary artery without angina pectoris I10 Essential (primary) hypertension N39.498 Other specified urinary incontinence Facility Procedures CPT4 Code: 80881103 Description: 15945 - WOUND CARE VISIT-LEV 5 EST PT Modifier: Quantity: 1 Physician Procedures CPT4 Code: 8592924 Description: 99214 - WC PHYS LEVEL 4 - EST PT ICD-10 Diagnosis Description L89.610 Pressure ulcer of right heel, unstageable L89.890 Pressure ulcer of other site, unstageable E11.622 Type 2 diabetes mellitus with other skin ulcer L97.522 Non-pressure  chronic ulcer of other part of left foot with fat Modifier: layer exposed Quantity: 1 Electronic Signature(s) Signed: 03/04/2019 11:41:35 AM By:  Sharon Mt Signed: 03/04/2019 1:10:31 PM By: Worthy Keeler PA-C Previous Signature: 03/04/2019 9:12:34 AM Version By: Worthy Keeler PA-C Entered By: Sharon Mt on 03/04/2019 11:41:34

## 2019-03-03 NOTE — Progress Notes (Signed)
PRANIT, OWENSBY ([id number]) Visit Report for 03/03/2019 Arrival Information Details Patient Name: [name], [name]. Date of Service: 03/03/2019 1:30 PM Medical Record Number: [id number] Patient Account Number: [id number] Date of Birth/Sex: [birth year] (86 y.o. M) Treating RN: Army Melia Primary Care Aniyla Harling: Caryl Pina Other Clinician: Referring Solomon Skowronek: Caryl Pina Treating Shakeia Krus/Extender: Melburn Hake, [name]: 4 Visit Information History Since Last Visit Added or deleted any medications: No Patient Arrived: Wheel Chair Any new allergies or adverse reactions: No Arrival Time: 13:38 Had a fall or experienced change in No Accompanied By: son activities of daily living that may affect Transfer Assistance: None risk of falls: Patient Identification Verified: Yes Signs or symptoms of abuse/neglect since last visito No Secondary Verification Process Completed: Yes Hospitalized since last visit: No Implantable device outside of the clinic excluding No cellular tissue based products placed in the center since last visit: Has Dressing in Place as Prescribed: Yes Pain Present Now: No Electronic Signature(s) Signed: 03/03/2019 4:13:51 PM By: Lorine Bears RCP, RRT, CHT Entered By: Lorine Bears on 03/03/2019 13:45:56 Deavers, [name] ([id number]) -------------------------------------------------------------------------------- Clinic Level of Care Assessment Details Patient Name: [name], [name]. Date of Service: 03/03/2019 1:30 PM Medical Record Number: [id number] Patient Account Number: [id number] Date of Birth/Sex: 1930/04/01 (86 y.o. M) Treating RN: Army Melia Primary Care Dreux Mcgroarty: Caryl Pina Other Clinician: Referring Adaora Mchaney: Caryl Pina Treating Kachina Niederer/Extender: Melburn Hake, [name]: 4 Clinic Level of Care Assessment Items TOOL 4 Quantity Score []  - Use when only an EandM is performed on  FOLLOW-UP visit 0 ASSESSMENTS - Nursing Assessment / Reassessment X - Reassessment of Co-morbidities (includes updates in patient status) 1 10 X- 1 5 Reassessment of Adherence to Treatment Plan ASSESSMENTS - Wound and Skin Assessment / Reassessment []  - Simple Wound Assessment / Reassessment - one wound 0 X- 7 5 Complex Wound Assessment / Reassessment - multiple wounds []  - 0 Dermatologic / Skin Assessment (not related to wound area) ASSESSMENTS - Focused Assessment []  - Circumferential Edema Measurements - multi extremities 0 []  - 0 Nutritional Assessment / Counseling / Intervention []  - 0 Lower Extremity Assessment (monofilament, tuning fork, pulses) []  - 0 Peripheral Arterial Disease Assessment (using hand held doppler) ASSESSMENTS - Ostomy and/or Continence Assessment and Care []  - Incontinence Assessment and Management 0 []  - 0 Ostomy Care Assessment and Management (repouching, etc.) PROCESS - Coordination of Care X - Simple Patient / Family Education for ongoing care 1 15 []  - 0 Complex (extensive) Patient / Family Education for ongoing care X- 1 10 Staff obtains Programmer, systems, Records, Test Results / Process Orders []  - 0 Staff telephones HHA, Nursing Homes / Clarify orders / etc []  - 0 Routine Transfer to another Facility (non-emergent condition) []  - 0 Routine Hospital Admission (non-emergent condition) []  - 0 New Admissions / Biomedical engineer / Ordering NPWT, Apligraf, etc. []  - 0 Emergency Hospital Admission (emergent condition) X- 1 10 Simple Discharge Coordination DECLYN, DELSOL ([id number]) []  - 0 Complex (extensive) Discharge Coordination PROCESS - Special Needs []  - Pediatric / Minor Patient Management 0 []  - 0 Isolation Patient Management []  - 0 Hearing / Language / Visual special needs []  - 0 Assessment of Community assistance (transportation, D/C planning, etc.) []  - 0 Additional assistance / Altered mentation []  - 0 Support Surface(s)  Assessment (bed, cushion, seat, etc.) INTERVENTIONS - Wound Cleansing / Measurement []  - Simple Wound Cleansing - one wound 0 X- 7 5 Complex Wound Cleansing - multiple wounds X-  1 5 Wound Imaging (photographs - any number of wounds) []  - 0 Wound Tracing (instead of photographs) []  - 0 Simple Wound Measurement - one wound X- 7 5 Complex Wound Measurement - multiple wounds INTERVENTIONS - Wound Dressings []  - Small Wound Dressing one or multiple wounds 0 X- 7 15 Medium Wound Dressing one or multiple wounds []  - 0 Large Wound Dressing one or multiple wounds []  - 0 Application of Medications - topical []  - 0 Application of Medications - injection INTERVENTIONS - Miscellaneous []  - External ear exam 0 []  - 0 Specimen Collection (cultures, biopsies, blood, body fluids, etc.) []  - 0 Specimen(s) / Culture(s) sent or taken to Lab for analysis []  - 0 Patient Transfer (multiple staff / Civil Service fast streamer / Similar devices) []  - 0 Simple Staple / Suture removal (25 or less) []  - 0 Complex Staple / Suture removal (26 or more) []  - 0 Hypo / Hyperglycemic Management (close monitor of Blood Glucose) []  - 0 Ankle / Brachial Index (ABI) - do not check if billed separately X- 1 5 Vital Signs [name], [name]. ([id number]) Has the patient been seen at the hospital within the last three years: Yes Total Score: 270 Level Of Care: New/Established - Level 5 Electronic Signature(s) Signed: 03/03/2019 5:01:42 PM By: Army Melia Entered By: Army Melia on 03/03/2019 14:33:20 Arcidiacono, [name] ([id number]) -------------------------------------------------------------------------------- Encounter Discharge Information Details Patient Name: [name], [name]. Date of Service: 03/03/2019 1:30 PM Medical Record Number: [id number] Patient Account Number: [id number] Date of Birth/Sex: June 01, 1930 (86 y.o. M) Treating RN: Army Melia Primary Care Niyana Chesbro: Caryl Pina Other Clinician: Referring  Kaitlynd Phillips: Caryl Pina Treating Laylia Mui/Extender: Melburn Hake, [name]: 4 Encounter Discharge Information Items Discharge Condition: Stable Ambulatory Status: Wheelchair Discharge Destination: Home Transportation: Private Auto Accompanied By: family Schedule Follow-up Appointment: Yes Clinical Summary of Care: Electronic Signature(s) Signed: 03/03/2019 5:01:42 PM By: Army Melia Entered By: Army Melia on 03/03/2019 14:34:33 Aldana, [name] ([id number]) -------------------------------------------------------------------------------- Lower Extremity Assessment Details Patient Name: [name], [name]. Date of Service: 03/03/2019 1:30 PM Medical Record Number: [id number] Patient Account Number: [id number] Date of Birth/Sex: 1930-04-12 (86 y.o. M) Treating RN: Montey Hora Primary Care Kaito Schulenburg: Caryl Pina Other Clinician: Referring Brieanne Mignone: Caryl Pina Treating Dorann Davidson/Extender: Melburn Hake, [name]: 4 Vascular Assessment Pulses: Dorsalis Pedis Palpable: [Left:Yes] [Right:Yes] Posterior Tibial Palpable: [Left:Yes] [Right:Yes] Electronic Signature(s) Signed: 03/03/2019 4:40:20 PM By: Montey Hora Entered By: Montey Hora on 03/03/2019 14:05:32 Sato, [name] ([id number]) -------------------------------------------------------------------------------- Multi Wound Chart Details Patient Name: [name], [name]. Date of Service: 03/03/2019 1:30 PM Medical Record Number: [id number] Patient Account Number: [id number] Date of Birth/Sex: 08-05-1930 (86 y.o. M) Treating RN: Army Melia Primary Care Caylin Raby: Caryl Pina Other Clinician: Referring Stellah Donovan: Caryl Pina Treating Codee Bloodworth/Extender: Melburn Hake, [name]: 4 Vital Signs Height(in): 68 Pulse(bpm): 92 Weight(lbs): 235 Blood Pressure(mmHg): 118/50 Body Mass Index(BMI): 36 Temperature(F): 97.8 Respiratory Rate 16 (breaths/min): Photos: [1:No  Photos] [2:No Photos] [3:No Photos] Wound Location: [1:Right Calcaneus] [2:Right Toe Fourth] [3:Right Toe Fifth] Wounding Event: [1:Gradually Appeared] [2:Gradually Appeared] [3:Pressure Injury] Primary Etiology: [1:Pressure Ulcer] [2:Pressure Ulcer] [3:Pressure Ulcer] Comorbid History: [1:Cataracts, Hypertension, Myocardial Infarction, Type II Diabetes, Osteoarthritis, Neuropathy] [2:Cataracts, Hypertension, Myocardial Infarction, Type II Diabetes, Osteoarthritis, Neuropathy] [3:Cataracts, Hypertension, Myocardial  Infarction, Type II Diabetes, Osteoarthritis, Neuropathy] Date Acquired: [1:12/05/2018] [2:09/05/2018] [3:09/05/2018] Weeks of Treatment: [1:4] [2:4] [3:4] Wound Status: [1:Open] [2:Open] [3:Open] Measurements L x W x D [1:5.5x3.8x0.5] [2:0.2x0.3x0.1] [3:0.8x0.4x0.1] (cm) Area (cm) : [1:16.415] [2:0.047] [3:0.251] Volume (cm) : [  1:8.207] [2:0.005] [3:0.025] % Reduction in Area: [1:-133.30%] [2:50.00%] [3:20.10%] % Reduction in Volume: [1:-133.20%] [2:44.40%] [3:19.40%] Classification: [1:Unstageable/Unclassified] [2:Unstageable/Unclassified] [3:Unstageable/Unclassified] Exudate Amount: [1:Medium] [2:Medium] [3:Medium] Exudate Type: [1:Serosanguineous] [2:Serous] [3:Serous] Exudate Color: [1:red, brown] [2:amber] [3:amber] Wound Margin: [1:Flat and Intact] [2:Flat and Intact] [3:Flat and Intact] Granulation Amount: [1:None Present (0%)] [2:None Present (0%)] [3:None Present (0%)] Granulation Quality: [1:N/A] [2:N/A] [3:N/A] Necrotic Amount: [1:Large (67-100%)] [2:Large (67-100%)] [3:Large (67-100%)] Necrotic Tissue: [1:Eschar, Adherent Slough] [2:Eschar] [3:Eschar] Exposed Structures: [1:Fat Layer (Subcutaneous Tissue) Exposed: Yes Fascia: No Tendon: No Muscle: No Joint: No Bone: No None] [2:Fat Layer (Subcutaneous Tissue) Exposed: Yes Fascia: No Tendon: No Muscle: No Joint: No Bone: No None] [3:Fat Layer (Subcutaneous Tissue)  Exposed: Yes Fascia: No Tendon: No Muscle: No Joint:  No Bone: No None] Wound Number: 4 5 6  Photos: No Photos No Photos No Photos [name], [name] ([id number]) Wound Location: Left Toe Third - Lateral Left Toe Fourth - Medial Left Toe Fourth - Lateral Wounding Event: Gradually Appeared Gradually Appeared Gradually Appeared Primary Etiology: Diabetic Wound/Ulcer of the Diabetic Wound/Ulcer of the Diabetic Wound/Ulcer of the Lower Extremity Lower Extremity Lower Extremity Comorbid History: Cataracts, Hypertension, Cataracts, Hypertension, Cataracts, Hypertension, Myocardial Infarction, Type II Myocardial Infarction, Type II Myocardial Infarction, Type II Diabetes, Osteoarthritis, Diabetes, Osteoarthritis, Diabetes, Osteoarthritis, Neuropathy Neuropathy Neuropathy Date Acquired: 03/03/2019 03/03/2019 03/03/2019 Weeks of Treatment: 0 0 0 Wound Status: Open Open Open Measurements L x W x D 0.4x0.3x0.1 0.3x0.7x0.1 1.2x1.3x0.2 (cm) Area (cm) : 0.094 0.165 1.225 Volume (cm) : 0.009 0.016 0.245 % Reduction in Area: 0.00% 0.00% 0.00% % Reduction in Volume: 0.00% 0.00% 0.00% Classification: Grade 1 Grade 1 Grade 1 Exudate Amount: Medium Medium Medium Exudate Type: Purulent Purulent Purulent Exudate Color: yellow, brown, green yellow, brown, green yellow, brown, green Wound Margin: Flat and Intact Flat and Intact Flat and Intact Granulation Amount: None Present (0%) Small (1-33%) None Present (0%) Granulation Quality: N/A Pink N/A Necrotic Amount: Large (67-100%) Large (67-100%) Large (67-100%) Necrotic Tissue: Eschar, Adherent Slough Eschar, Adherent Slough Eschar, Adherent Slough Exposed Structures: Fat Layer (Subcutaneous Fat Layer (Subcutaneous Fat Layer (Subcutaneous Tissue) Exposed: Yes Tissue) Exposed: Yes Tissue) Exposed: Yes Fascia: No Fascia: No Fascia: No Tendon: No Tendon: No Tendon: No Muscle: No Muscle: No Muscle: No Joint: No Joint: No Joint: No Bone: No Bone: No Bone: No Epithelialization: None None None Wound Number: 7  N/A N/A Photos: No Photos N/A N/A Wound Location: Left Toe Fifth - Medial N/A N/A Wounding Event: Gradually Appeared N/A N/A Primary Etiology: Diabetic Wound/Ulcer of the N/A N/A Lower Extremity Comorbid History: Cataracts, Hypertension, N/A N/A Myocardial Infarction, Type II Diabetes, Osteoarthritis, Neuropathy Date Acquired: 03/03/2019 N/A N/A Weeks of Treatment: 0 N/A N/A Wound Status: Open N/A N/A Measurements L x W x D 1.2x1.1x0.1 N/A N/A (cm) Area (cm) : 1.037 N/A N/A Volume (cm) : 0.104 N/A N/A % Reduction in Area: 0.00% N/A N/A % Reduction in Volume: 0.00% N/A N/A Classification: Grade 1 N/A N/A Exudate Amount: Medium N/A N/A Exudate Type: Purulent N/A N/A [name], [name]. ([id number]) Exudate Color: yellow, brown, green N/A N/A Wound Margin: Flat and Intact N/A N/A Granulation Amount: None Present (0%) N/A N/A Granulation Quality: N/A N/A N/A Necrotic Amount: Large (67-100%) N/A N/A Necrotic Tissue: Eschar, Adherent Slough N/A N/A Exposed Structures: Fat Layer (Subcutaneous N/A N/A Tissue) Exposed: Yes Fascia: No Tendon: No Muscle: No Joint: No Bone: No Epithelialization: None N/A N/A Treatment Notes Electronic Signature(s) Signed: 03/03/2019 5:01:42 PM By: Army Melia Entered By: Nicki Reaper,  Dajea on 03/03/2019 14:19:23 [name], [name] ([id number]) -------------------------------------------------------------------------------- Multi-Disciplinary Care Plan Details Patient Name: [name], [name]. Date of Service: 03/03/2019 1:30 PM Medical Record Number: [id number] Patient Account Number: [id number] Date of Birth/Sex: 1930-10-11 (86 y.o. M) Treating RN: Army Melia Primary Care Asharia Lotter: Caryl Pina Other Clinician: Referring Keanan Melander: Caryl Pina Treating Katrell Milhorn/Extender: Melburn Hake, [name]: 4 Active Inactive Orientation to the Wound Care Program Nursing Diagnoses: Knowledge deficit related to the wound healing center  program Goals: Patient/caregiver will verbalize understanding of the [facility] Program Date Initiated: 02/03/2019 Target Resolution Date: 03/04/2019 Goal Status: Active Interventions: Provide education on orientation to the wound center Notes: Pressure Nursing Diagnoses: Knowledge deficit related to causes and risk factors for pressure ulcer development Goals: Patient/caregiver will verbalize risk factors for pressure ulcer development Date Initiated: 02/03/2019 Target Resolution Date: 03/04/2019 Goal Status: Active Interventions: Assess: immobility, friction, shearing, incontinence upon admission and as needed Notes: Wound/Skin Impairment Nursing Diagnoses: Impaired tissue integrity Goals: Ulcer/skin breakdown will have a volume reduction of 30% by week 4 Date Initiated: 02/03/2019 Target Resolution Date: 03/04/2019 Goal Status: Active Interventions: Assess ulceration(s) every visit [name], [name] ([id number]) Notes: Electronic Signature(s) Signed: 03/03/2019 5:01:42 PM By: Army Melia Entered By: Army Melia on 03/03/2019 14:19:04 Obyrne, [name] ([id number]) -------------------------------------------------------------------------------- Non-Wound Condition Assessment Details Patient Name: [name]. Date of Service: 03/03/2019 1:30 PM Medical Record Number: [id number] Patient Account Number: [id number] Date of Birth/Sex: [birth year] (86 y.o. M) Treating RN: Cornell Barman Primary Care Yanira Tolsma: Caryl Pina Other Clinician: Referring Levelle Edelen: Caryl Pina Treating Lennon Boutwell/Extender: Melburn Hake, [name]: 4 Non-Wound Condition: Condition: Suspected Deep Tissue Injury Location: Foot Side: Bilateral Notes: Bilateral feet Photos Electronic Signature(s) Signed: 03/03/2019 2:27:40 PM By: Gretta Cool, BSN, RN, CWS, Kim RN, BSN Previous Signature: 03/03/2019 2:26:50 PM Version By: Gretta Cool, BSN, RN, CWS, Kim RN, BSN Entered By: Gretta Cool, BSN, RN,  CWS, Kim on 03/03/2019 14:27:40 Lorentz, [name] ([id number]) -------------------------------------------------------------------------------- Pain Assessment Details Patient Name: [name], [name]. Date of Service: 03/03/2019 1:30 PM Medical Record Number: [id number] Patient Account Number: [id number] Date of Birth/Sex: 02-Jul-1930 (86 y.o. M) Treating RN: Army Melia Primary Care Chandni Gagan: Caryl Pina Other Clinician: Referring Elli Groesbeck: Caryl Pina Treating Furqan Gosselin/Extender: Melburn Hake, [name]: 4 Active Problems Location of Pain Severity and Description of Pain Patient Has Paino No Site Locations Pain Management and Medication Current Pain Management: Electronic Signature(s) Signed: 03/03/2019 1:59:34 PM By: Army Melia Signed: 03/03/2019 4:13:51 PM By: Lorine Bears RCP, RRT, CHT Entered By: Lorine Bears on 03/03/2019 13:46:02 Maharaj, [name] ([id number]) -------------------------------------------------------------------------------- Patient/Caregiver Education Details Patient Name: OLEGARIO, EMBERSON. Date of Service: 03/03/2019 1:30 PM Medical Record Number: [id number] Patient Account Number: [id number] Date of Birth/Gender: [birth year] (86 y.o. M) Treating RN: Army Melia Primary Care Physician: Caryl Pina Other Clinician: Referring Physician: Caryl Pina Treating Physician/Extender: Sharalyn Ink in Treatment: 4 Education Assessment Education Provided To: Patient Education Topics Provided Wound/Skin Impairment: Handouts: Caring for Your Ulcer Methods: Demonstration, Explain/Verbal Responses: State content correctly Electronic Signature(s) Signed: 03/03/2019 5:01:42 PM By: Army Melia Entered By: Army Melia on 03/03/2019 14:33:35 Mehl, [name] ([id number]) -------------------------------------------------------------------------------- Wound Assessment Details Patient Name: MAKARIOS, MADLOCK. Date of Service: 03/03/2019 1:30 PM Medical Record Number: [id number] Patient Account Number: [id number] Date of Birth/Sex: 05-01-1930 (86 y.o. M) Treating RN: Montey Hora Primary Care Yasuo Phimmasone: Caryl Pina Other Clinician: Referring Jermaine Tholl: Caryl Pina Treating Yaresly Menzel/Extender: STONE III, [name]: 4 Wound Status Wound Number: 1  Primary Pressure Ulcer Etiology: Wound Location: Right Calcaneus Wound Open Wounding Event: Gradually Appeared Status: Date Acquired: 12/05/2018 Comorbid Cataracts, Hypertension, Myocardial Infarction, Weeks Of Treatment: 4 History: Type II Diabetes, Osteoarthritis, Neuropathy Clustered Wound: No Photos Photo Uploaded By: Gretta Cool, BSN, RN, CWS, Kim on 03/03/2019 14:21:41 Wound Measurements Length: (cm) 5.5 Width: (cm) 3.8 Depth: (cm) 0.5 Area: (cm) 16.415 Volume: (cm) 8.207 % Reduction in Area: -133.3% % Reduction in Volume: -133.2% Epithelialization: None Tunneling: No Undermining: No Wound Description Classification: Unstageable/Unclassified Foul Odo Wound Margin: Flat and Intact Slough/F Exudate Amount: Medium Exudate Type: Serosanguineous Exudate Color: red, brown r After Cleansing: No ibrino Yes Wound Bed Granulation Amount: None Present (0%) Exposed Structure Necrotic Amount: Large (67-100%) Fascia Exposed: No Necrotic Quality: Eschar, Adherent Slough Fat Layer (Subcutaneous Tissue) Exposed: Yes Tendon Exposed: No Muscle Exposed: No Joint Exposed: No Bone Exposed: No Treatment Notes MILOS, MILLIGAN ([id number]) Wound #1 (Right Calcaneus) Notes iodoflex to right heel, betadine to toes Electronic Signature(s) Signed: 03/03/2019 4:40:20 PM By: Montey Hora Entered By: Montey Hora on 03/03/2019 14:08:37 Mentel, [name] ([id number]) -------------------------------------------------------------------------------- Wound Assessment Details Patient Name: [name]. Date of Service:  03/03/2019 1:30 PM Medical Record Number: [id number] Patient Account Number: [id number] Date of Birth/Sex: 04/25/1930 (86 y.o. M) Treating RN: Montey Hora Primary Care Colby Catanese: Caryl Pina Other Clinician: Referring Sharonlee Nine: Caryl Pina Treating Mailyn Steichen/Extender: Melburn Hake, [name]: 4 Wound Status Wound Number: 2 Primary Pressure Ulcer Etiology: Wound Location: Right Toe Fourth Wound Open Wounding Event: Gradually Appeared Status: Date Acquired: 09/05/2018 Comorbid Cataracts, Hypertension, Myocardial Infarction, Weeks Of Treatment: 4 History: Type II Diabetes, Osteoarthritis, Neuropathy Clustered Wound: No Photos Photo Uploaded By: Gretta Cool, BSN, RN, CWS, Kim on 03/03/2019 14:21:42 Wound Measurements Length: (cm) 0.2 Width: (cm) 0.3 Depth: (cm) 0.1 Area: (cm) 0.047 Volume: (cm) 0.005 % Reduction in Area: 50% % Reduction in Volume: 44.4% Epithelialization: None Tunneling: No Undermining: No Wound Description Classification: Unstageable/Unclassified Foul Odo Wound Margin: Flat and Intact Slough/F Exudate Amount: Medium Exudate Type: Serous Exudate Color: amber r After Cleansing: No ibrino No Wound Bed Granulation Amount: None Present (0%) Exposed Structure Necrotic Amount: Large (67-100%) Fascia Exposed: No Necrotic Quality: Eschar Fat Layer (Subcutaneous Tissue) Exposed: Yes Tendon Exposed: No Muscle Exposed: No Joint Exposed: No Bone Exposed: No Treatment Notes KELEN, LAURA ([id number]) Wound #2 (Right Toe Fourth) Notes iodoflex to right heel, betadine to toes Electronic Signature(s) Signed: 03/03/2019 4:40:20 PM By: Montey Hora Entered By: Montey Hora on 03/03/2019 14:08:55 Scholten, [name] ([id number]) -------------------------------------------------------------------------------- Wound Assessment Details Patient Name: [name]. Date of Service: 03/03/2019 1:30 PM Medical Record Number: [id number] Patient  Account Number: [id number] Date of Birth/Sex: September 14, 1930 (86 y.o. M) Treating RN: Montey Hora Primary Care Gizel Riedlinger: Caryl Pina Other Clinician: Referring Knut Rondinelli: Caryl Pina Treating Mazy Culton/Extender: Melburn Hake, [name]: 4 Wound Status Wound Number: 3 Primary Pressure Ulcer Etiology: Wound Location: Right Toe Fifth Wound Open Wounding Event: Pressure Injury Status: Date Acquired: 09/05/2018 Comorbid Cataracts, Hypertension, Myocardial Infarction, Weeks Of Treatment: 4 History: Type II Diabetes, Osteoarthritis, Neuropathy Clustered Wound: No Photos Photo Uploaded By: Gretta Cool, BSN, RN, CWS, Kim on 03/03/2019 14:23:11 Wound Measurements Length: (cm) 0.8 Width: (cm) 0.4 Depth: (cm) 0.1 Area: (cm) 0.251 Volume: (cm) 0.025 % Reduction in Area: 20.1% % Reduction in Volume: 19.4% Epithelialization: None Tunneling: No Undermining: No Wound Description Classification: Unstageable/Unclassified Foul Odo Wound Margin: Flat and Intact Slough/F Exudate Amount: Medium Exudate Type: Serous Exudate Color: amber r After Cleansing: No ibrino No  Wound Bed Granulation Amount: None Present (0%) Exposed Structure Necrotic Amount: Large (67-100%) Fascia Exposed: No Necrotic Quality: Eschar Fat Layer (Subcutaneous Tissue) Exposed: Yes Tendon Exposed: No Muscle Exposed: No Joint Exposed: No Bone Exposed: No Treatment Notes ITAMAR, MCGOWAN ([id number]) Wound #3 (Right Toe Fifth) Notes iodoflex to right heel, betadine to toes Electronic Signature(s) Signed: 03/03/2019 4:40:20 PM By: Montey Hora Entered By: Montey Hora on 03/03/2019 14:09:09 Spearman, [name] ([id number]) -------------------------------------------------------------------------------- Wound Assessment Details Patient Name: ARBY, DAHIR. Date of Service: 03/03/2019 1:30 PM Medical Record Number: [id number] Patient Account Number: [id number] Date of Birth/Sex: November 26, 1930 (86 y.o.  M) Treating RN: Montey Hora Primary Care Treyton Slimp: Caryl Pina Other Clinician: Referring Nikia Levels: Caryl Pina Treating Isha Seefeld/Extender: STONE III, [name]: 4 Wound Status Wound Number: 4 Primary Diabetic Wound/Ulcer of the Lower Extremity Etiology: Wound Location: Left Toe Third - Lateral Wound Open Wounding Event: Gradually Appeared Status: Date Acquired: 03/03/2019 Comorbid Cataracts, Hypertension, Myocardial Infarction, Weeks Of Treatment: 0 History: Type II Diabetes, Osteoarthritis, Neuropathy Clustered Wound: No Photos Photo Uploaded By: Gretta Cool, BSN, RN, CWS, Kim on 03/03/2019 14:23:12 Wound Measurements Length: (cm) 0.4 Width: (cm) 0.3 Depth: (cm) 0.1 Area: (cm) 0.094 Volume: (cm) 0.009 % Reduction in Area: 0% % Reduction in Volume: 0% Epithelialization: None Tunneling: No Undermining: No Wound Description Classification: Grade 1 Foul Odor Wound Margin: Flat and Intact Slough/Fi Exudate Amount: Medium Exudate Type: Purulent Exudate Color: yellow, brown, green After Cleansing: No brino Yes Wound Bed Granulation Amount: None Present (0%) Exposed Structure Necrotic Amount: Large (67-100%) Fascia Exposed: No Necrotic Quality: Eschar, Adherent Slough Fat Layer (Subcutaneous Tissue) Exposed: Yes Tendon Exposed: No Muscle Exposed: No Joint Exposed: No Bone Exposed: No Treatment Notes DAGEN, BEEVERS ([id number]) Wound #4 (Left, Lateral Toe Third) Notes iodoflex to right heel, betadine to toes Electronic Signature(s) Signed: 03/03/2019 4:40:20 PM By: Montey Hora Entered By: Montey Hora on 03/03/2019 14:09:48 Guimont, [name] ([id number]) -------------------------------------------------------------------------------- Wound Assessment Details Patient Name: [name]. Date of Service: 03/03/2019 1:30 PM Medical Record Number: [id number] Patient Account Number: [id number] Date of Birth/Sex: 12/22/1930 (86 y.o.  M) Treating RN: Montey Hora Primary Care Wylie Coon: Caryl Pina Other Clinician: Referring Taunya Goral: Caryl Pina Treating Shauntee Karp/Extender: STONE III, [name]: 4 Wound Status Wound Number: 5 Primary Diabetic Wound/Ulcer of the Lower Extremity Etiology: Wound Location: Left Toe Fourth - Medial Wound Open Wounding Event: Gradually Appeared Status: Date Acquired: 03/03/2019 Comorbid Cataracts, Hypertension, Myocardial Infarction, Weeks Of Treatment: 0 History: Type II Diabetes, Osteoarthritis, Neuropathy Clustered Wound: No Photos Photo Uploaded By: Gretta Cool, BSN, RN, CWS, Kim on 03/03/2019 14:24:59 Wound Measurements Length: (cm) 0.3 Width: (cm) 0.7 Depth: (cm) 0.1 Area: (cm) 0.165 Volume: (cm) 0.016 % Reduction in Area: 0% % Reduction in Volume: 0% Epithelialization: None Tunneling: No Undermining: No Wound Description Classification: Grade 1 Foul Odor Wound Margin: Flat and Intact Slough/Fi Exudate Amount: Medium Exudate Type: Purulent Exudate Color: yellow, brown, green After Cleansing: No brino Yes Wound Bed Granulation Amount: Small (1-33%) Exposed Structure Granulation Quality: Pink Fascia Exposed: No Necrotic Amount: Large (67-100%) Fat Layer (Subcutaneous Tissue) Exposed: Yes Necrotic Quality: Eschar, Adherent Slough Tendon Exposed: No Muscle Exposed: No Joint Exposed: No Bone Exposed: No Treatment Notes GOBLE, FUDALA ([id number]) Wound #5 (Left, Medial Toe Fourth) Notes iodoflex to right heel, betadine to toes Electronic Signature(s) Signed: 03/03/2019 4:40:20 PM By: Montey Hora Entered By: Montey Hora on 03/03/2019 14:10:15 Mastrogiovanni, [name] ([id number]) -------------------------------------------------------------------------------- Wound Assessment Details Patient Name: [name]. Date  of Service: 03/03/2019 1:30 PM Medical Record Number: [id number] Patient Account Number: [id number] Date of Birth/Sex:  [birth year] (86 y.o. M) Treating RN: Montey Hora Primary Care Andres Vest: Caryl Pina Other Clinician: Referring Jennifermarie Franzen: Caryl Pina Treating Andon Villard/Extender: STONE III, [name]: 4 Wound Status Wound Number: 6 Primary Diabetic Wound/Ulcer of the Lower Extremity Etiology: Wound Location: Left Toe Fourth - Lateral Wound Open Wounding Event: Gradually Appeared Status: Date Acquired: 03/03/2019 Comorbid Cataracts, Hypertension, Myocardial Infarction, Weeks Of Treatment: 0 History: Type II Diabetes, Osteoarthritis, Neuropathy Clustered Wound: No Photos Photo Uploaded By: Gretta Cool, BSN, RN, CWS, Kim on 03/03/2019 14:25:00 Wound Measurements Length: (cm) 1.2 Width: (cm) 1.3 Depth: (cm) 0.2 Area: (cm) 1.225 Volume: (cm) 0.245 % Reduction in Area: 0% % Reduction in Volume: 0% Epithelialization: None Tunneling: No Undermining: No Wound Description Classification: Grade 1 Foul Odor Wound Margin: Flat and Intact Slough/Fi Exudate Amount: Medium Exudate Type: Purulent Exudate Color: yellow, brown, green After Cleansing: No brino Yes Wound Bed Granulation Amount: None Present (0%) Exposed Structure Necrotic Amount: Large (67-100%) Fascia Exposed: No Necrotic Quality: Eschar, Adherent Slough Fat Layer (Subcutaneous Tissue) Exposed: Yes Tendon Exposed: No Muscle Exposed: No Joint Exposed: No Bone Exposed: No Treatment Notes KAILO, KOSIK ([id number]) Wound #6 (Left, Lateral Toe Fourth) Notes iodoflex to right heel, betadine to toes Electronic Signature(s) Signed: 03/03/2019 4:40:20 PM By: Montey Hora Entered By: Montey Hora on 03/03/2019 14:10:42 Bryars, [name] ([id number]) -------------------------------------------------------------------------------- Wound Assessment Details Patient Name: [name]. Date of Service: 03/03/2019 1:30 PM Medical Record Number: [id number] Patient Account Number: [id number] Date of Birth/Sex:  [birth year] (86 y.o. M) Treating RN: Montey Hora Primary Care Ijeoma Loor: Caryl Pina Other Clinician: Referring Zachory Mangual: Caryl Pina Treating Teesha Ohm/Extender: STONE III, [name]: 4 Wound Status Wound Number: 7 Primary Diabetic Wound/Ulcer of the Lower Extremity Etiology: Wound Location: Left Toe Fifth - Medial Wound Open Wounding Event: Gradually Appeared Status: Date Acquired: 03/03/2019 Comorbid Cataracts, Hypertension, Myocardial Infarction, Weeks Of Treatment: 0 History: Type II Diabetes, Osteoarthritis, Neuropathy Clustered Wound: No Photos Photo Uploaded By: Gretta Cool, BSN, RN, CWS, Kim on 03/03/2019 14:25:52 Wound Measurements Length: (cm) 1.2 % Reducti Width: (cm) 1.1 % Reducti Depth: (cm) 0.1 Epithelia Area: (cm) 1.037 Tunnelin Volume: (cm) 0.104 Undermin on in Area: 0% on in Volume: 0% lization: None g: No ing: No Wound Description Classification: Grade 1 Foul Odor Wound Margin: Flat and Intact Slough/Fi Exudate Amount: Medium Exudate Type: Purulent Exudate Color: yellow, brown, green After Cleansing: No brino Yes Wound Bed Granulation Amount: None Present (0%) Exposed Structure Necrotic Amount: Large (67-100%) Fascia Exposed: No Necrotic Quality: Eschar, Adherent Slough Fat Layer (Subcutaneous Tissue) Exposed: Yes Tendon Exposed: No Muscle Exposed: No Joint Exposed: No Bone Exposed: No Treatment Notes TYSEAN, VANDERVLIET ([id number]) Wound #7 (Left, Medial Toe Fifth) Notes iodoflex to right heel, betadine to toes Electronic Signature(s) Signed: 03/03/2019 4:40:20 PM By: Montey Hora Entered By: Montey Hora on 03/03/2019 14:11:06 Wildeman, [name] ([id number]) -------------------------------------------------------------------------------- Vitals Details Patient Name: [name]. Date of Service: 03/03/2019 1:30 PM Medical Record Number: [id number] Patient Account Number: [id number] Date of Birth/Sex: 1930/12/06 (86  y.o. M) Treating RN: Army Melia Primary Care Brexley Cutshaw: Caryl Pina Other Clinician: Referring Briarrose Shor: Caryl Pina Treating Colinda Barth/Extender: Melburn Hake, [name]: 4 Vital Signs Time Taken: 13:47 Temperature (F): 97.8 Height (in): 68 Pulse (bpm): 92 Weight (lbs): 235 Respiratory Rate (breaths/min): 16 Body Mass Index (BMI): 35.7 Blood Pressure (mmHg): 118/50 Reference Range: 80 - 120 mg / dl Electronic  Signature(s) Signed: 03/03/2019 4:13:51 PM By: Lorine Bears RCP, RRT, CHT Entered By: Lorine Bears on 03/03/2019 13:49:17

## 2019-03-09 ENCOUNTER — Ambulatory Visit: Payer: Medicare Other

## 2019-03-10 ENCOUNTER — Other Ambulatory Visit: Payer: Self-pay

## 2019-03-10 ENCOUNTER — Ambulatory Visit
Admission: RE | Admit: 2019-03-10 | Discharge: 2019-03-10 | Disposition: A | Discharge status: Home / Self Care | Payer: Medicare Other | Source: Ambulatory Visit | Attending: Physician Assistant | Admitting: Physician Assistant

## 2019-03-10 ENCOUNTER — Encounter: Payer: Medicare Other | Admitting: Physician Assistant

## 2019-03-10 DIAGNOSIS — L89610 Pressure ulcer of right heel, unstageable: Secondary | ICD-10-CM | POA: Diagnosis present

## 2019-03-10 LAB — POCT I-STAT CREATININE: Creatinine, Ser: 1.7 mg/dL — ABNORMAL HIGH (ref 0.61–1.24)

## 2019-03-10 MED ORDER — GADOBUTROL 1 MMOL/ML IV SOLN
7.5000 mL | Freq: Once | INTRAVENOUS | Status: AC | PRN
  Administered 2019-03-10: 7.5 mL via INTRAVENOUS

## 2019-03-10 NOTE — Progress Notes (Addendum)
[name], [name] ([id number]) Visit Report for 03/10/2019 Chief Complaint Document Details Patient Name: Bobby, Ho. Date of Service: 03/10/2019 12:45 PM Medical Record Number: [id number] Patient Account Number: [id number] Date of Birth/Sex: 1931-02-24 (86 y.o. M) Treating RN: Army Melia Primary Care Provider: Caryl Pina Other Clinician: Referring Provider: Caryl Pina Treating Provider/Extender: Melburn Hake, Tiffini Blacksher Weeks in Treatment: 5 Information Obtained from: Patient Chief Complaint Heel pressure ulcer and right 4th and 5th toe pressure ulcers along with multiple left toe ulcers Electronic Signature(s) Signed: 03/10/2019 1:14:08 PM By: Worthy Keeler PA-C Entered By: Worthy Keeler on 03/10/2019 13:14:08 [name], [name] ([id number]) -------------------------------------------------------------------------------- HPI Details Patient Name: Bobby, Ho. Date of Service: 03/10/2019 12:45 PM Medical Record Number: [id number] Patient Account Number: [id number] Date of Birth/Sex: [birth year] (86 y.o. M) Treating RN: Army Melia Primary Care Provider: Caryl Pina Other Clinician: Referring Provider: Caryl Pina Treating Provider/Extender: Melburn Hake, Lankford Gutzmer Weeks in Treatment: 5 History of Present Illness HPI Description: 02/03/2019 patient presents today for initial evaluation in our clinic concerning issues that he has been having with wounds on his right heel as well as the right fourth and fifth toes. The toe ulcers have been present for about 5 months. They are pretty much just dry and eschar covered at this point but appear to be stable. The heel ulcer has been present for about 2 months and is very soft the eschar is loosening and there appears to be purulent drainage as well noted at this point there is also erythema surrounding the wound bed. I am unsure if this is just inflammation or secondary to infection to be perfectly honest. With that being  said the patient has recently gotten Prevalon offloading boots and he is essentially nonambulatory at this point. He did have a coronary artery bypass graft x4 in 1989. Otherwise he tells me that he also has hypertension and urinary incontinence. Obviously that is not playing any role in his wounds currently based on what we are seeing today. 02/10/2019 on evaluation today patient appears to be doing quite well with regard to his wounds. He is not showing a lot of signs of improvement yet but he still has a lot of eschar at this point. He still awaiting the vascular evaluation at this time as well. Fortunately there is no signs of active infection. No fevers, chills, nausea, vomiting, or diarrhea. 02/17/2019 on evaluation today patient appears to be doing quite well with regard to his wound. This seems to be greatly improved compared to last visit I do believe that the cefdinir is doing well for him. Fortunately there is no signs of active infection at this time. No fever chills noted no fevers, chills, nausea, vomiting, or diarrhea. 03/03/2019 on evaluation today patient appears to be doing unfortunately worse even compared to his last visit. His wounds have not progressed in a good way and unfortunately he has multiple new locations on the left foot as well that are giving him trouble. I am obviously concerned about how things are progressing at this point. I did actually have results to review for him today. This included his arterial study which revealed that his ABIs were not obtainable and subsequently his TBI's were very low in the 0.35-0.30 range bilaterally. Subsequently his x-ray also revealed that there are potential changes on the x-ray at the heel on the right that could be associated with osteomyelitis. Nonetheless I do believe that the patient does get a likely require a vascular consult as well as  an MRI to further evaluate the situation. In the meantime I am also going to recommend  refilling the Omnicef to continue treatment as such since that does seem to have been at least somewhat beneficial for him. 03/10/2019 upon evaluation today patient appears to be doing unfortunately a little bit worse in regard to new areas of what appear to be necrotic tissue showing up at multiple places on his lower extremity. I am very concerned about this to be honest. In fact he has an appointment next Thursday with vascular for evaluation. I really want to see if we try to get this moved up sooner I discussed this with the patient he is in agreement with that. He is also going for his MRI today once he leaves the clinic here. Nonetheless I am concerned about his arterial status based on what we are seeing currently. Electronic Signature(s) Signed: 03/10/2019 3:27:48 PM By: Worthy Keeler PA-C Entered By: Worthy Keeler on 03/10/2019 15:27:48 [name], [name] ([id number]) -------------------------------------------------------------------------------- Physical Exam Details Patient Name: Bobby, Ho. Date of Service: 03/10/2019 12:45 PM Medical Record Number: [id number] Patient Account Number: [id number] Date of Birth/Sex: 07-14-1930 (86 y.o. M) Treating RN: Army Melia Primary Care Provider: Caryl Pina Other Clinician: Referring Provider: Caryl Pina Treating Provider/Extender: STONE III, Marjorie Deprey Weeks in Treatment: 5 Constitutional Well-nourished and well-hydrated in no acute distress. Respiratory normal breathing without difficulty. Psychiatric this patient is able to make decisions and demonstrates good insight into disease process. Alert and Oriented x 3. pleasant and cooperative. Notes Upon inspection today patient appeared to be doing somewhat poorly in regard to his feet bilaterally. Especially on the right however his arterial status appears to be somewhat worse with regard to causing new necrotic areas that are showing up. I do believe that he needs to have  a more expedited vascular evaluation. To that end I did actually contact the vascular office today to try to move up his appointment. Also I am get a recommend the patient continue with the antibiotic therapy at this time. Electronic Signature(s) Signed: 03/10/2019 3:28:58 PM By: Worthy Keeler PA-C Entered By: Worthy Keeler on 03/10/2019 15:28:58 [name] ([id number]) -------------------------------------------------------------------------------- Physician Orders Details Patient Name: Bobby, Ho. Date of Service: 03/10/2019 12:45 PM Medical Record Number: [id number] Patient Account Number: [id number] Date of Birth/Sex: 11-11-1930 (86 y.o. M) Treating RN: Army Melia Primary Care Provider: Caryl Pina Other Clinician: Referring Provider: Caryl Pina Treating Provider/Extender: Melburn Hake, Marisa Hufstetler Weeks in Treatment: 5 Verbal / Phone Orders: No Diagnosis Coding ICD-10 Coding Code Description L89.610 Pressure ulcer of right heel, unstageable L89.890 Pressure ulcer of other site, unstageable E11.622 Type 2 diabetes mellitus with other skin ulcer L97.522 Non-pressure chronic ulcer of other part of left foot with fat layer exposed L97.512 Non-pressure chronic ulcer of other part of right foot with fat layer exposed I73.89 Other specified peripheral vascular diseases I25.10 Atherosclerotic heart disease of native coronary artery without angina pectoris I10 Essential (primary) hypertension N39.498 Other specified urinary incontinence Wound Cleansing Wound #1 Right Calcaneus o Clean wound with Normal Saline. - in office o Cleanse wound with mild soap and water Wound #2 Right Toe Fourth o Clean wound with Normal Saline. - in office o Cleanse wound with mild soap and water Wound #3 Right Toe Fifth o Clean wound with Normal Saline. - in office o Cleanse wound with mild soap and water Wound #4 Left,Lateral Toe Third o Clean wound with Normal Saline.  - in office o Cleanse  wound with mild soap and water Wound #5 Left,Medial Toe Fourth o Clean wound with Normal Saline. - in office o Cleanse wound with mild soap and water Wound #6 Left,Lateral Toe Fourth o Clean wound with Normal Saline. - in office o Cleanse wound with mild soap and water Wound #7 Left,Medial Toe Fifth o Clean wound with Normal Saline. - in office o Cleanse wound with mild soap and water Primary Wound Dressing KEATIN, BENHAM ([id number]) Wound #1 Right Calcaneus o Iodoflex Wound #2 Right Toe Fourth o Other: - betadine Wound #3 Right Toe Fifth o Other: - betadine Wound #4 Left,Lateral Toe Third o Other: - betadine Wound #5 Left,Medial Toe Fourth o Other: - betadine Wound #6 Left,Lateral Toe Fourth o Other: - betadine Wound #7 Left,Medial Toe Fifth o Other: - betadine Secondary Dressing o Conform/Kerlix o Other - heel cup Dressing Change Frequency Wound #1 Right Calcaneus o Change Dressing Tuesday and Thursday. - Tuesday by [facility] Wound #2 Right Toe Fourth o Change Dressing Tuesday and Thursday. - Tuesday by [facility] Wound #3 Right Toe Fifth o Change Dressing Tuesday and Thursday. - Tuesday by [facility] Wound #4 Left,Lateral Toe Third o Change Dressing Tuesday and Thursday. - Tuesday by [facility] Wound #5 Left,Medial Toe Fourth o Change Dressing Tuesday and Thursday. - Tuesday by [facility] Wound #6 Left,Lateral Toe Fourth o Change Dressing Tuesday and Thursday. - Tuesday by [facility] Wound #7 Left,Medial Toe Fifth o Change Dressing Tuesday and Thursday. - Tuesday by [facility] Follow-up Appointments Wound #1 Right Calcaneus o Return Appointment in 1 week. Wound #2 Right Toe Fourth o Return Appointment in 1 week. Wound #3 Right Toe Fifth o Return Appointment in 1 week. Bobby, Ho ([id number]) Wound #4 Left,Lateral Toe Third o Return Appointment in 1 week. Wound #5 Left,Medial Toe Fourth o Return Appointment in 1 week. Wound #6  Left,Lateral Toe Fourth o Return Appointment in 1 week. Wound #7 Left,Medial Toe Fifth o Return Appointment in 1 week. Home Health Wound #1 Right [place] Visits - [place] Nurse may visit PRN to address patientos wound care needs. o FACE TO FACE ENCOUNTER: MEDICARE and MEDICAID PATIENTS: I certify that this patient is under my care and that I had a face-to-face encounter that meets the physician face-to-face encounter requirements with this patient on this date. The encounter with the patient was in whole or in part for the following MEDICAL CONDITION: (primary reason for [place]) MEDICAL NECESSITY: I certify, that based on my findings, NURSING services are a medically necessary home health service. HOME BOUND STATUS: I certify that my clinical findings support that this patient is homebound (i.e., Due to illness or injury, pt requires aid of supportive devices such as crutches, cane, wheelchairs, walkers, the use of special transportation or the assistance of another person to leave their place of residence. There is a normal inability to leave the home and doing so requires considerable and taxing effort. Other absences are for medical reasons / religious services and are infrequent or of short duration when for other reasons). o If current dressing causes regression in wound condition, may D/C ordered dressing product/s and apply Normal Saline Moist Dressing daily until next [place] / Other MD appointment. [place] of regression in wound condition at [phone]. o Please direct any NON-WOUND related issues/requests for orders to patient's Primary Care Physician Wound #2 Right Toe [place] Visits - [place] Nurse [phone]  visit PRN to address patientos wound care needs. o FACE TO FACE ENCOUNTER: MEDICARE and MEDICAID PATIENTS: I certify that this patient is  under my care and that I had a face-to-face encounter that meets the physician face-to-face encounter requirements with this patient on this date. The encounter with the patient was in whole or in part for the following MEDICAL CONDITION: (primary reason for [place]) MEDICAL NECESSITY: I certify, that based on my findings, NURSING services are a medically necessary home health service. HOME BOUND STATUS: I certify that my clinical findings support that this patient is homebound (i.e., Due to illness or injury, pt requires aid of supportive devices such as crutches, cane, wheelchairs, walkers, the use of special transportation or the assistance of another person to leave their place of residence. There is a normal inability to leave the home and doing so requires considerable and taxing effort. Other absences are for medical reasons / religious services and are infrequent or of short duration when for other reasons). o If current dressing causes regression in wound condition, may D/C ordered dressing product/s and apply Normal Saline Moist Dressing daily until next [place] / Other MD appointment. [place] of regression in wound condition at [phone]. o Please direct any NON-WOUND related issues/requests for orders to patient's Primary Care Physician Wound #3 Right Toe [place] Visits - [place] Nurse may visit PRN to address patientos wound care needs. o FACE TO FACE ENCOUNTER: MEDICARE and MEDICAID PATIENTS: I certify that this patient is under my care and that I had a face-to-face encounter that meets the physician face-to-face encounter requirements with this patient on this date. The encounter with the patient was in whole or in part for the following MEDICAL CONDITION: (primary reason for [place]) MEDICAL NECESSITY: I certify, that based on my findings, NURSING services are a medically necessary  home health service. HOME BOUND STATUS: I certify that my Bobby, Ho ([id number]) clinical findings support that this patient is homebound (i.e., Due to illness or injury, pt requires aid of supportive devices such as crutches, cane, wheelchairs, walkers, the use of special transportation or the assistance of another person to leave their place of residence. There is a normal inability to leave the home and doing so requires considerable and taxing effort. Other absences are for medical reasons / religious services and are infrequent or of short duration when for other reasons). o If current dressing causes regression in wound condition, may D/C ordered dressing product/s and apply Normal Saline Moist Dressing daily until next [place] / Other MD appointment. [place] of regression in wound condition at [phone]. o Please direct any NON-WOUND related issues/requests for orders to patient's Primary Care Physician Wound #4 Left,Lateral Toe [place] Visits - [place] Nurse may visit PRN to address patientos wound care needs. o FACE TO FACE ENCOUNTER: MEDICARE and MEDICAID PATIENTS: I certify that this patient is under my care and that I had a face-to-face encounter that meets the physician face-to-face encounter requirements with this patient on this date. The encounter with the patient was in whole or in part for the following MEDICAL CONDITION: (primary reason for [place]) MEDICAL NECESSITY: I certify, that based on my findings, NURSING services are a medically necessary home health service. HOME BOUND STATUS: I certify that my clinical findings support that this patient is homebound (i.e., Due to illness or injury,  pt requires aid of supportive devices such as crutches, cane, wheelchairs, walkers, the use of special transportation or the assistance of another person to leave their place of residence.  There is a normal inability to leave the home and doing so requires considerable and taxing effort. Other absences are for medical reasons / religious services and are infrequent or of short duration when for other reasons). o If current dressing causes regression in wound condition, may D/C ordered dressing product/s and apply Normal Saline Moist Dressing daily until next [place] / Other MD appointment. [place] of regression in wound condition at [phone]. o Please direct any NON-WOUND related issues/requests for orders to patient's Primary Care Physician Wound #5 Left,Medial Toe [place] Visits - [place] Nurse may visit PRN to address patientos wound care needs. o FACE TO FACE ENCOUNTER: MEDICARE and MEDICAID PATIENTS: I certify that this patient is under my care and that I had a face-to-face encounter that meets the physician face-to-face encounter requirements with this patient on this date. The encounter with the patient was in whole or in part for the following MEDICAL CONDITION: (primary reason for [place]) MEDICAL NECESSITY: I certify, that based on my findings, NURSING services are a medically necessary home health service. HOME BOUND STATUS: I certify that my clinical findings support that this patient is homebound (i.e., Due to illness or injury, pt requires aid of supportive devices such as crutches, cane, wheelchairs, walkers, the use of special transportation or the assistance of another person to leave their place of residence. There is a normal inability to leave the home and doing so requires considerable and taxing effort. Other absences are for medical reasons / religious services and are infrequent or of short duration when for other reasons). o If current dressing causes regression in wound condition, may D/C ordered dressing product/s and apply Normal Saline Moist Dressing daily  until next [place] / Other MD appointment. [place] of regression in wound condition at [phone]. o Please direct any NON-WOUND related issues/requests for orders to patient's Primary Care Physician Wound #6 Left,Lateral Toe [place] Visits - [place] Nurse may visit PRN to address patientos wound care needs. o FACE TO FACE ENCOUNTER: MEDICARE and MEDICAID PATIENTS: I certify that this patient is under my care and that I had a face-to-face encounter that meets the physician face-to-face encounter requirements with this patient on this date. The encounter with the patient was in whole or in part for the following MEDICAL CONDITION: (primary reason for [place]) MEDICAL NECESSITY: I certify, that based on my findings, NURSING services are a medically necessary home health service. HOME BOUND STATUS: I certify that my clinical findings support that this patient is homebound (i.e., Due to illness or injury, pt requires aid of supportive devices such as crutches, cane, wheelchairs, walkers, the use of special transportation or the assistance of another person to leave their place of residence. There is a normal inability to leave the home and doing so requires considerable and taxing effort. Other absences are for medical reasons / religious services and are infrequent or of short duration when for other reasons). Bobby, Ho ([id number]) o If current dressing causes regression in wound condition, may D/C ordered dressing product/s and apply Normal Saline Moist Dressing daily until next [place] / Other MD appointment. [place] of regression in wound condition at  [phone]. o Please direct any NON-WOUND related issues/requests for orders to patient's Primary Care Physician Wound #7 Left,Medial Toe [place] Visits - [place] Nurse may  visit PRN to address patientos wound care needs. o FACE TO FACE ENCOUNTER: MEDICARE and MEDICAID PATIENTS: I certify that this patient is under my care and that I had a face-to-face encounter that meets the physician face-to-face encounter requirements with this patient on this date. The encounter with the patient was in whole or in part for the following MEDICAL CONDITION: (primary reason for [place]) MEDICAL NECESSITY: I certify, that based on my findings, NURSING services are a medically necessary home health service. HOME BOUND STATUS: I certify that my clinical findings support that this patient is homebound (i.e., Due to illness or injury, pt requires aid of supportive devices such as crutches, cane, wheelchairs, walkers, the use of special transportation or the assistance of another person to leave their place of residence. There is a normal inability to leave the home and doing so requires considerable and taxing effort. Other absences are for medical reasons / religious services and are infrequent or of short duration when for other reasons). o If current dressing causes regression in wound condition, may D/C ordered dressing product/s and apply Normal Saline Moist Dressing daily until next [place] / Other MD appointment. [place] of regression in wound condition at [phone]. o Please direct any NON-WOUND related issues/requests for orders to patient's Primary Care Physician Consults o Podiatry - Patient needs a referral to Dr. Amalia Hailey at [facility] due to MRI findings o Infectious Disease - Patient requires an ID referral to [facility] due to MRI findings Electronic Signature(s) Signed: 03/11/2019 12:29:09 PM By: Worthy Keeler PA-C Previous Signature: 03/10/2019 2:34:43 PM Version By: Army Melia Previous Signature: 03/10/2019 3:59:29 PM Version By: Worthy Keeler PA-C Entered By: Worthy Keeler on 03/11/2019  12:27:42 [place], [name] ([id number]) -------------------------------------------------------------------------------- Problem List Details Patient Name: Bobby, Ho. Date of Service: 03/10/2019 12:45 PM Medical Record Number: [id number] Patient Account Number: [id number] Date of Birth/Sex: February 26, 1930 (86 y.o. M) Treating RN: Army Melia Primary Care Provider: Caryl Pina Other Clinician: Referring Provider: Caryl Pina Treating Provider/Extender: Melburn Hake, Nainoa W[place]t Weeks in Treatment: 5 Active Problems ICD-10 Evaluated Encounter Code Description Active Date Today Diagnosis L89.610 Pressure ulcer of right heel, unstageable 02/03/2019 No Yes L89.890 Pressure ulcer of other site, unstageable 02/03/2019 No Yes E11.622 Type 2 diabetes mellitus with other skin ulcer 03/03/2019 No Yes L97.522 Non-pressure chronic ulcer of other part of left foot with fat 03/03/2019 No Yes layer exposed L97.512 Non-pressure chronic ulcer of other part of right foot with fat 03/03/2019 No Yes layer exposed I73.89 Other specified peripheral vascular diseases 03/03/2019 No Yes I25.10 Atherosclerotic heart disease of native coronary artery 02/03/2019 No Yes without angina pectoris I10 Essential (primary) hypertension 02/03/2019 No Yes N39.498 Other specified urinary incontinence 02/03/2019 No Yes Inactive Problems BEACHER, EVERY ([id number]) Resolved Problems Electronic Signature(s) Signed: 03/10/2019 1:14:02 PM By: Worthy Keeler PA-C Entered By: Worthy Keeler on 03/10/2019 13:14:02 Schwoerer, [name] ([id number]) -------------------------------------------------------------------------------- Progress Note Details Patient Name: [name]. Date of Service: 03/10/2019 12:45 PM Medical Record Number: [id number] Patient Account Number: [id number] Date of Birth/Sex: [birth year] (86 y.o. M) Treating RN: Army Melia Primary Care Provider: Caryl Pina Other Clinician: Referring Provider:  Caryl Pina Treating Provider/Extender: Melburn Hake, Melvina Pangelinan Weeks in Treatment: 5 Subjective Chief Complaint Information obtained from Patient  Heel pressure ulcer and right 4th and 5th toe pressure ulcers along with multiple left toe ulcers History of Present Illness (HPI) 02/03/2019 patient presents today for initial evaluation in our clinic concerning issues that he has been having with wounds on his right heel as well as the right fourth and fifth toes. The toe ulcers have been present for about 5 months. They are pretty much just dry and eschar covered at this point but appear to be stable. The heel ulcer has been present for about 2 months and is very soft the eschar is loosening and there appears to be purulent drainage as well noted at this point there is also erythema surrounding the wound bed. I am unsure if this is just inflammation or secondary to infection to be perfectly honest. With that being said the patient has recently gotten Prevalon offloading boots and he is essentially nonambulatory at this point. He did have a coronary artery bypass graft x4 in 1989. Otherwise he tells me that he also has hypertension and urinary incontinence. Obviously that is not playing any role in his wounds currently based on what we are seeing today. 02/10/2019 on evaluation today patient appears to be doing quite well with regard to his wounds. He is not showing a lot of signs of improvement yet but he still has a lot of eschar at this point. He still awaiting the vascular evaluation at this time as well. Fortunately there is no signs of active infection. No fevers, chills, nausea, vomiting, or diarrhea. 02/17/2019 on evaluation today patient appears to be doing quite well with regard to his wound. This seems to be greatly improved compared to last visit I do believe that the cefdinir is doing well for him. Fortunately there is no signs of active infection at this time. No fever chills noted no  fevers, chills, nausea, vomiting, or diarrhea. 03/03/2019 on evaluation today patient appears to be doing unfortunately worse even compared to his last visit. His wounds have not progressed in a good way and unfortunately he has multiple new locations on the left foot as well that are giving him trouble. I am obviously concerned about how things are progressing at this point. I did actually have results to review for him today. This included his arterial study which revealed that his ABIs were not obtainable and subsequently his TBI's were very low in the 0.35-0.30 range bilaterally. Subsequently his x-ray also revealed that there are potential changes on the x-ray at the heel on the right that could be associated with osteomyelitis. Nonetheless I do believe that the patient does get a likely require a vascular consult as well as an MRI to further evaluate the situation. In the meantime I am also going to recommend refilling the Omnicef to continue treatment as such since that does seem to have been at least somewhat beneficial for him. 03/10/2019 upon evaluation today patient appears to be doing unfortunately a little bit worse in regard to new areas of what appear to be necrotic tissue showing up at multiple places on his lower extremity. I am very concerned about this to be honest. In fact he has an appointment next Thursday with vascular for evaluation. I really want to see if we try to get this moved up sooner I discussed this with the patient he is in agreement with that. He is also going for his MRI today once he leaves the clinic here. Nonetheless I am concerned about his arterial status based on what we  are seeing currently. Objective YASUO, PHIMMASONE ([id number]) Constitutional Well-nourished and well-hydrated in no acute distress. Vitals Time Taken: 12:50 PM, Height: 68 in, Weight: 235 lbs, BMI: 35.7, Temperature: 97.6 F, Pulse: 84 bpm, Respiratory Rate: 18 breaths/min, Blood Pressure:  128/62 mmHg. Respiratory normal breathing without difficulty. Psychiatric this patient is able to make decisions and demonstrates good insight into disease process. Alert and Oriented x 3. pleasant and cooperative. General Notes: Upon inspection today patient appeared to be doing somewhat poorly in regard to his feet bilaterally. Especially on the right however his arterial status appears to be somewhat worse with regard to causing new necrotic areas that are showing up. I do believe that he needs to have a more expedited vascular evaluation. To that end I did actually contact the vascular office today to try to move up his appointment. Also I am get a recommend the patient continue with the antibiotic therapy at this time. Integumentary (Hair, Skin) Wound #1 status is Open. Original cause of wound was Gradually Appeared. The wound is located on the Right Calcaneus. The wound measures 6.3cm length x 4.2cm width x 0.5cm depth; 20.782cm^2 area and 10.391cm^3 volume. There is Fat Layer (Subcutaneous Tissue) Exposed exposed. There is no tunneling or undermining noted. There is a medium amount of serosanguineous drainage noted. The wound margin is flat and intact. There is no granulation within the wound bed. There is a large (67-100%) amount of necrotic tissue within the wound bed including Eschar and Adherent Slough. Wound #2 status is Open. Original cause of wound was Gradually Appeared. The wound is located on the Right Toe Fourth. The wound measures 0.2cm length x 0.3cm width x 0.1cm depth; 0.047cm^2 area and 0.005cm^3 volume. There is Fat Layer (Subcutaneous Tissue) Exposed exposed. There is no tunneling or undermining noted. There is a none present amount of drainage noted. The wound margin is flat and intact. There is no granulation within the wound bed. There is a large (67-100%) amount of necrotic tissue within the wound bed including Eschar. Wound #3 status is Open. Original cause of  wound was Pressure Injury. The wound is located on the Right Toe Fifth. The wound measures 0.8cm length x 0.4cm width x 0.1cm depth; 0.251cm^2 area and 0.025cm^3 volume. There is Fat Layer (Subcutaneous Tissue) Exposed exposed. There is no tunneling or undermining noted. There is a none present amount of drainage noted. The wound margin is flat and intact. There is no granulation within the wound bed. There is a large (67-100%) amount of necrotic tissue within the wound bed including Eschar. Wound #4 status is Open. Original cause of wound was Gradually Appeared. The wound is located on the Left,Lateral Toe Third. The wound measures 0.5cm length x 0.4cm width x 0.1cm depth; 0.157cm^2 area and 0.016cm^3 volume. There is Fat Layer (Subcutaneous Tissue) Exposed exposed. There is no tunneling or undermining noted. There is a medium amount of purulent drainage noted. The wound margin is flat and intact. There is no granulation within the wound bed. There is a large (67-100%) amount of necrotic tissue within the wound bed including Eschar and Adherent Slough. Wound #5 status is Open. Original cause of wound was Gradually Appeared. The wound is located on the Left,Medial Toe Fourth. The wound measures 0.4cm length x 0.5cm width x 0.1cm depth; 0.157cm^2 area and 0.016cm^3 volume. There is Fat Layer (Subcutaneous Tissue) Exposed exposed. There is no tunneling or undermining noted. There is a medium amount of purulent drainage noted. The wound margin is flat  and intact. There is small (1-33%) pink granulation within the wound bed. There is a large (67-100%) amount of necrotic tissue within the wound bed including Eschar and Adherent Slough. Wound #6 status is Open. Original cause of wound was Gradually Appeared. The wound is located on the Left,Lateral Toe Fourth. The wound measures 1.1cm length x 0.9cm width x 0.2cm depth; 0.778cm^2 area and 0.156cm^3 volume. There is Fat Layer (Subcutaneous Tissue) Exposed  exposed. There is no tunneling or undermining noted. There is a medium amount of purulent drainage noted. The wound margin is flat and intact. There is no granulation within the wound bed. There is a large (67-100%) amount of necrotic tissue within the wound bed including Eschar and Adherent Slough. Wound #7 status is Open. Original cause of wound was Gradually Appeared. The wound is located on the [address] [place] ([id number]) Fifth. The wound measures 1.6cm length x 0.7cm width x 0.1cm depth; 0.88cm^2 area and 0.088cm^3 volume. There is Fat Layer (Subcutaneous Tissue) Exposed exposed. There is no tunneling or undermining noted. There is a medium amount of purulent drainage noted. The wound margin is flat and intact. There is no granulation within the wound bed. There is a large (67-100%) amount of necrotic tissue within the wound bed including Eschar and Adherent Slough. 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 - in office Cleanse wound with mild soap and water Wound #2 Right Toe Fourth: Clean wound with Normal Saline. - in office Cleanse wound with mild soap and water Wound #3 Right Toe Fifth: Clean wound with Normal Saline. - in office Cleanse wound with mild soap and water Wound #4 Left,Lateral Toe Third: Clean wound with Normal Saline. - in office Cleanse wound with mild soap and water Wound #5 Left,Medial Toe  Fourth: Clean wound with Normal Saline. - in office Cleanse wound with mild soap and water Wound #6 Left,Lateral Toe Fourth: Clean wound with Normal Saline. - in office Cleanse wound with mild soap and water Wound #7 Left,Medial Toe Fifth: Clean wound with Normal Saline. - in office Cleanse wound with mild soap and water Primary Wound Dressing: Wound #1 Right Calcaneus: Iodoflex Wound #2 Right Toe Fourth: Other: - betadine Wound #3 Right Toe FifthVERDUN, Ho ([id number]) Other: - betadine Wound #4 Left,Lateral Toe Third: Other: - betadine Wound #5 Left,Medial Toe Fourth: Other: - betadine Wound #6 Left,Lateral Toe Fourth: Other: - betadine Wound #7 Left,Medial Toe Fifth: Other: - betadine Secondary Dressing: Conform/Kerlix Other - heel cup Dressing Change Frequency: Wound #1 Right Calcaneus: Change Dressing Tuesday and Thursday. - Tuesday by [facility] Wound #2 Right Toe Fourth: Change Dressing Tuesday and Thursday. - Tuesday by [facility] Wound #3 Right Toe Fifth: Change Dressing Tuesday and Thursday. - Tuesday by [facility] Wound #4 Left,Lateral Toe Third: Change Dressing Tuesday and Thursday. - Tuesday by [facility] Wound #5 Left,Medial Toe Fourth: Change Dressing Tuesday and Thursday. - Tuesday by [facility] Wound #6 Left,Lateral Toe Fourth: Change Dressing Tuesday and Thursday. - Tuesday by [facility] Wound #7 Left,Medial Toe Fifth: Change Dressing Tuesday and  Thursday. - Tuesday by [facility] Follow-up Appointments: Wound #1 Right Calcaneus: Return Appointment in 1 week. Wound #2 Right Toe Fourth: Return Appointment in 1 week. Wound #3 Right Toe Fifth: Return Appointment in 1 week. Wound #4 Left,Lateral Toe Third: Return Appointment in 1 week. Wound #5 Left,Medial Toe Fourth: Return Appointment in 1 week. Wound #6 Left,Lateral Toe Fourth: Return Appointment in 1 week. Wound #7 Left,Medial Toe Fifth: Return Appointment in 1 week. Home Health: Wound #1 Right Calcaneus: Continue Home Health Visits -  [place] Nurse may visit PRN to address patient s wound care needs. FACE TO FACE ENCOUNTER: MEDICARE and MEDICAID PATIENTS: I certify that this patient is under my care and that I had a face-to-face encounter that meets the physician face-to-face encounter requirements with this patient on this date. The encounter with the patient was in whole or in part for the following MEDICAL CONDITION: (primary reason for [place]) MEDICAL NECESSITY: I certify, that based on my findings, NURSING services are a medically necessary home health service. HOME BOUND STATUS: I certify that my clinical findings support that this patient is homebound (i.e., Due to illness or injury, pt requires aid of supportive devices such as crutches, cane, wheelchairs, walkers, the use of special transportation or the assistance of another person to leave their place of residence. There is a normal inability to leave the home and doing so requires considerable and taxing effort. Other absences are for medical reasons / religious services and are infrequent or of short duration when for other reasons). If current dressing causes regression in wound condition, may D/C ordered dressing product/s and apply Normal Saline Moist Dressing daily until next [place] / Other MD appointment. [place] of regression in wound condition at [phone]. Please direct any NON-WOUND related issues/requests for orders to patient's Primary Care Physician Wound #2 Right Toe Fourth: Bobby, Ho ([id number]) [place] Visits - [place] Nurse may visit PRN to address patient s wound care needs. FACE TO FACE ENCOUNTER: MEDICARE and MEDICAID PATIENTS: I certify that this patient is under my care and that I had a face-to-face encounter that meets the physician face-to-face encounter requirements with this patient on this date. The encounter with the patient was in whole or in  part for the following MEDICAL CONDITION: (primary reason for [place]) MEDICAL NECESSITY: I certify, that based on my findings, NURSING services are a medically necessary home health service. HOME BOUND STATUS: I certify that my clinical findings support that this patient is homebound (i.e., Due to illness or injury, pt requires aid of supportive devices such as crutches, cane, wheelchairs, walkers, the use of special transportation or the assistance of another person to leave their place of residence. There is a normal inability to leave the home and doing so requires considerable and taxing effort. Other absences are for medical reasons / religious services and are infrequent or of short duration when for other reasons). If current dressing causes regression in wound condition, may D/C ordered dressing product/s and apply Normal Saline Moist Dressing daily until next [place] / Other MD appointment. [place] of regression in wound condition at [phone]. Please direct any NON-WOUND related issues/requests for orders to patient's Primary Care Physician Wound #3 Right Toe Fifth: [place] Visits - [place] Nurse may visit PRN to address patient s wound care needs. FACE TO FACE ENCOUNTER: MEDICARE and MEDICAID PATIENTS: I certify that this patient  is under my care and that I had a face-to-face encounter that meets the physician face-to-face encounter requirements with this patient on this date. The encounter with the patient was in whole or in part for the following MEDICAL CONDITION: (primary reason for [place]) MEDICAL NECESSITY: I certify, that based on my findings, NURSING services are a medically necessary home health service. HOME BOUND STATUS: I certify that my clinical findings support that this patient is homebound (i.e., Due to illness or injury, pt requires aid of supportive devices such as crutches, cane,  wheelchairs, walkers, the use of special transportation or the assistance of another person to leave their place of residence. There is a normal inability to leave the home and doing so requires considerable and taxing effort. Other absences are for medical reasons / religious services and are infrequent or of short duration when for other reasons). If current dressing causes regression in wound condition, may D/C ordered dressing product/s and apply Normal Saline Moist Dressing daily until next [place] / Other MD appointment. [place] of regression in wound condition at [phone]. Please direct any NON-WOUND related issues/requests for orders to patient's Primary Care Physician Wound #4 Left,Lateral Toe Third: [place] Visits - [place] Nurse may visit PRN to address patient s wound care needs. FACE TO FACE ENCOUNTER: MEDICARE and MEDICAID PATIENTS: I certify that this patient is under my care and that I had a face-to-face encounter that meets the physician face-to-face encounter requirements with this patient on this date. The encounter with the patient was in whole or in part for the following MEDICAL CONDITION: (primary reason for [place]) MEDICAL NECESSITY: I certify, that based on my findings, NURSING services are a medically necessary home health service. HOME BOUND STATUS: I certify that my clinical findings support that this patient is homebound (i.e., Due to illness or injury, pt requires aid of supportive devices such as crutches, cane, wheelchairs, walkers, the use of special transportation or the assistance of another person to leave their place of residence. There is a normal inability to leave the home and doing so requires considerable and taxing effort. Other absences are for medical reasons / religious services and are infrequent or of short duration when for other reasons). If current dressing causes regression  in wound condition, may D/C ordered dressing product/s and apply Normal Saline Moist Dressing daily until next [employer] / Other MD appointment. [place] of regression in wound condition at [phone]. Please direct any NON-WOUND related issues/requests for orders to patient's Primary Care Physician Wound #5 Left,Medial Toe Fourth: [place] Visits - [place] Nurse may visit PRN to address patient s wound care needs. FACE TO FACE ENCOUNTER: MEDICARE and MEDICAID PATIENTS: I certify that this patient is under my care and that I had a face-to-face encounter that meets the physician face-to-face encounter requirements with this patient on this date. The encounter with the patient was in whole or in part for the following MEDICAL CONDITION: (primary reason for [place]) MEDICAL NECESSITY: I certify, that based on my findings, NURSING services are a medically necessary home health service. HOME BOUND STATUS: I certify that my clinical findings support that this patient is homebound (i.e., Due to illness or injury, pt requires aid of supportive devices such as crutches, cane, wheelchairs, walkers, the use of special transportation or the assistance of another person to leave their place of residence. There is a normal inability  to leave the home and doing so requires considerable and taxing effort. Other absences are for medical reasons / religious services and are infrequent or of short duration when for other reasons). If current dressing causes regression in wound condition, may D/C ordered dressing product/s and apply Normal Saline Bobby, Ho ([id number]) Moist Dressing daily until next [place] / Other MD appointment. [place] of regression in wound condition at [phone]. Please direct any NON-WOUND related issues/requests for orders to patient's Primary Care Physician Wound #6 Left,Lateral Toe  Fourth: [place] Visits - [place] Nurse may visit PRN to address patient s wound care needs. FACE TO FACE ENCOUNTER: MEDICARE and MEDICAID PATIENTS: I certify that this patient is under my care and that I had a face-to-face encounter that meets the physician face-to-face encounter requirements with this patient on this date. The encounter with the patient was in whole or in part for the following MEDICAL CONDITION: (primary reason for [place]) MEDICAL NECESSITY: I certify, that based on my findings, NURSING services are a medically necessary home health service. HOME BOUND STATUS: I certify that my clinical findings support that this patient is homebound (i.e., Due to illness or injury, pt requires aid of supportive devices such as crutches, cane, wheelchairs, walkers, the use of special transportation or the assistance of another person to leave their place of residence. There is a normal inability to leave the home and doing so requires considerable and taxing effort. Other absences are for medical reasons / religious services and are infrequent or of short duration when for other reasons). If current dressing causes regression in wound condition, may D/C ordered dressing product/s and apply Normal Saline Moist Dressing daily until next [place] / Other MD appointment. [place] of regression in wound condition at [phone]. Please direct any NON-WOUND related issues/requests for orders to patient's Primary Care Physician Wound #7 Left,Medial Toe Fifth: [place] Visits - [place] Nurse may visit PRN to address patient s wound care needs. FACE TO FACE ENCOUNTER: MEDICARE and MEDICAID PATIENTS: I certify that this patient is under my care and that I had a face-to-face encounter that meets the physician face-to-face encounter requirements with this patient on this date. The encounter with the patient was in  whole or in part for the following MEDICAL CONDITION: (primary reason for [place]) MEDICAL NECESSITY: I certify, that based on my findings, NURSING services are a medically necessary home health service. HOME BOUND STATUS: I certify that my clinical findings support that this patient is homebound (i.e., Due to illness or injury, pt requires aid of supportive devices such as crutches, cane, wheelchairs, walkers, the use of special transportation or the assistance of another person to leave their place of residence. There is a normal inability to leave the home and doing so requires considerable and taxing effort. Other absences are for medical reasons / religious services and are infrequent or of short duration when for other reasons). If current dressing causes regression in wound condition, may D/C ordered dressing product/s and apply Normal Saline Moist Dressing daily until next [place] / Other MD appointment. [place] of regression in wound condition at [phone]. Please direct any NON-WOUND related issues/requests for orders to patient's Primary Care Physician Consults ordered were: Podiatry - Patient needs a referral to Dr. Amalia Hailey at [facility] due to MRI findings, Infectious Disease - Patient requires an ID referral to  [place] due to MRI findings 1. I discussed with the patient that I would like for him to try to see vascular sooner and actually did call [facility] to see if Dr. Delana Meyer would be able to potentially see the patient today. Unfortunately that is not going to be a possibility they were much too busy today although they will be able to move the patient till an earlier appointment on this coming Monday which is good news I am happy with that at least. If anything worsens in the meantime the patient may need to go to the ER for further evaluation however. Hopefully that will not be the case. 2. With regard to the  wounds currently on the patient's right lower extremity especially the more than left I believe that while vascular flow is very important following establishing a good arterial flow the patient is likely getting need to see podiatry in order to evaluate for the possibility of surgical debridement of the Achilles region. I may see about making a referral to Dr. Daylene Katayama to see if he can help [place] in this regard. The patient really wants to avoid if at all possible amputation which is what we are trying to do. 3. I am also going to suggest based on what we are seeing at this time that we go ahead and continue with the Betadine to most wound locations where he has dry stable eschar. We will use the Iodoflex on the Achilles region which is the posterior aspect of his heel as well that I think he is going to help to clean this area out more than anything else at this point. Obviously no debridement is undertaken due to the arterial status. 4. I am keeping him on the antibiotics well unfortunately we did have some issues with the fact that it appeared the patient could not find the last antibiotic that I prescribed for him. The good news is they did end up finding that we were able to locate where it was at his home which is excellent news. Bobby, Ho ([id number]) We will see patient back for reevaluation in 1 week here in the clinic. If anything worsens or changes patient will contact our office for additional recommendations. 03/11/2019 I did review the patient's MRI report today that was obtained yesterday. 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 He does have severe Achilles tendinopathy with a partial tear of the tendon and enhancement of the distal fibers worrisome for infection. He also has a complex fluid collection in the  retrocalcaneal bursa worrisome for septic bursitis. Unfortunately this is fairly extensive and I feel like the patient is likely going to need not only to be seen by vascular for improvement of arterial flow but I feel like he does need to be seen as soon as possible by podiatry as well. I will go ahead and put in the order for this referral. I also feel like the patient needs to be seen by infectious disease and I will go ahead and place that order as well. Electronic Signature(s) Signed: 03/11/2019 12:28:34 PM By: Worthy Keeler PA-C Previous Signature: 03/11/2019 12:25:24 PM Version By: Worthy Keeler PA-C Previous Signature: 03/11/2019 12:23:49 PM Version By: Worthy Keeler PA-C Previous Signature: 03/10/2019 3:31:29 PM Version By: Worthy Keeler PA-C Entered By: Worthy Keeler on 03/11/2019 51:76:16  TAIM, WURM ([id number]) -------------------------------------------------------------------------------- SuperBill Details Patient Name: TAVEON, ENYEART. Date of Service: 03/10/2019 Medical Record Number: [id number] Patient Account Number: [id number] Date of Birth/Sex: [birth year] (86 y.o. M) Treating RN: Army Melia Primary Care Provider: Caryl Pina Other Clinician: Referring Provider: Caryl Pina Treating Provider/Extender: Melburn Hake, Yonna Alwin Weeks in Treatment: 5 Diagnosis Coding ICD-10 Codes Code Description L89.610 Pressure ulcer of right heel, unstageable L89.890 Pressure ulcer of other site, unstageable E11.622 Type 2 diabetes mellitus with other skin ulcer L97.522 Non-pressure chronic ulcer of other part of left foot with fat layer exposed L97.512 Non-pressure chronic ulcer of other part of right foot with fat layer exposed I73.89 Other specified peripheral vascular diseases I25.10 Atherosclerotic heart disease of native coronary artery without angina pectoris I10 Essential (primary) hypertension N39.498 Other specified urinary incontinence Facility  Procedures CPT4 Code: 24235361 Description: 44315 - WOUND CARE VISIT-LEV 5 EST PT Modifier: Quantity: 1 Physician Procedures CPT4 Code: 4008676 Description: 99214 - WC PHYS LEVEL 4 - EST PT ICD-10 Diagnosis Description L89.610 Pressure ulcer of right heel, unstageable L89.890 Pressure ulcer of other site, unstageable E11.622 Type 2 diabetes mellitus with other skin ulcer L97.522 Non-pressure  chronic ulcer of other part of left foot with fat Modifier: layer exposed Quantity: 1 Electronic Signature(s) Signed: 03/11/2019 9:29:53 AM By: Sharon Mt Signed: 03/11/2019 12:29:09 PM By: Worthy Keeler PA-C Previous Signature: 03/10/2019 3:35:50 PM Version By: Worthy Keeler PA-C Entered By: Sharon Mt on 03/11/2019 09:29:52

## 2019-03-14 ENCOUNTER — Encounter (INDEPENDENT_AMBULATORY_CARE_PROVIDER_SITE_OTHER): Payer: Self-pay | Admitting: Vascular Surgery

## 2019-03-14 ENCOUNTER — Ambulatory Visit (INDEPENDENT_AMBULATORY_CARE_PROVIDER_SITE_OTHER): Payer: Medicare Other | Admitting: Vascular Surgery

## 2019-03-14 ENCOUNTER — Other Ambulatory Visit: Payer: Self-pay

## 2019-03-14 VITALS — BP 122/71 | HR 96 | Resp 16

## 2019-03-14 DIAGNOSIS — I7025 Atherosclerosis of native arteries of other extremities with ulceration: Secondary | ICD-10-CM

## 2019-03-14 DIAGNOSIS — I2510 Atherosclerotic heart disease of native coronary artery without angina pectoris: Secondary | ICD-10-CM

## 2019-03-14 DIAGNOSIS — E1121 Type 2 diabetes mellitus with diabetic nephropathy: Secondary | ICD-10-CM | POA: Diagnosis not present

## 2019-03-14 DIAGNOSIS — I10 Essential (primary) hypertension: Secondary | ICD-10-CM

## 2019-03-14 DIAGNOSIS — U071 COVID-19: Secondary | ICD-10-CM | POA: Insufficient documentation

## 2019-03-14 NOTE — Progress Notes (Signed)
MRN : [id number]  [name]. is a 86 y.o. (Mar 21, 1930) male who presents with chief complaint of No chief complaint on file. Marland Kitchen  History of Present Illness:   The patient is seen for evaluation of painful lower extremities and diminished pulses associated with ulceration of both feet.  The right foot is worse than the left.  The patient notes the ulcer has been present for multiple weeks and has not been improving.  It is very painful and has had some drainage.  No specific history of trauma noted by the patient.  The patient denies fever or chills.  the patient does have diabetes which has been difficult to control.  Patient notes prior to the ulcer developing the extremities were painful particularly with ambulation or activity and the discomfort is very consistent day today. Typically, the pain occurs at less than one block, progress is as activity continues to the point that the patient must stop walking. Resting including standing still for several minutes allowed resumption of the activity and the ability to walk a similar distance before stopping again. Uneven terrain and inclined shorten the distance. The pain has been progressive over the past several years.   The patient denies rest pain or dangling of an extremity off the side of the bed during the night for relief. No prior interventions or surgeries.  No history of back problems or DJD of the lumbar sacral spine.   The patient denies amaurosis fugax or recent TIA symptoms. There are no recent neurological changes noted. The patient denies history of DVT, PE or superficial thrombophlebitis. The patient denies recent episodes of angina or shortness of breath.   No outpatient medications have been marked as taking for the 03/14/19 encounter (Appointment) with Delana Meyer, Dolores Lory, MD.    Past Medical History:  Diagnosis Date  . Agent orange exposure 1966 or 1971  . Anemia   . Anxiety   . Arthritis    "hands and back"  (01/20/2013)  . CAD (coronary artery disease)    native vessel  . Carotid artery disease (HCC)    nonobstructive  . Cataract   . Cecal diverticulitis 2008   drained  . Cellulitis, gluteal    bilateral for the past 6 months/notes 01/20/2013  . Chronic lower back pain   . Chronic renal insufficiency   . Depression   . Diabetes mellitus type II   . Exertional shortness of breath   . HTN (hypertension)   . Hyperlipidemia   . Hypokalemia   . Malnutrition ([place])    protein-calorie  . Myocardial infarction [facility])    "silent; before OHS" (01/20/2013)  . PTSD (post-traumatic stress disorder)   . Spina bifida ([place])   . Urinary incontinence     Past Surgical History:  Procedure Laterality Date  . CARDIAC CATHETERIZATION  1998   "couple before my OHS" (01/20/2013)  . CARPAL TUNNEL RELEASE Right 1980's  . CATARACT EXTRACTION W/ INTRAOCULAR LENS  IMPLANT, BILATERAL Bilateral ~ 2010  . CORONARY ARTERY BYPASS GRAFT  1998   "CABG X4" (01/20/2013)  . CYSTECTOMY  2000's   "cytal cyst on my intestines; probed then drained it; hospitalized for 13 days; NPO" (01/20/2013)  . EYE SURGERY    . HEMORRHOID BANDING  ~ 10/2012  . KNEE SURGERY Left 1964   "exploratory; sewed it up w/wire" (01/20/2013)  . PROSTATE SURGERY    . SPINE SURGERY  11/24/1932   "Spina bifida surgery"  . STRABISMUS SURGERY Bilateral 03/29/2015  Procedure: REPAIR STRABISMUS BILATERAL;  Surgeon: Lamonte Sakai, MD;  Location: [facility];  Service: Ophthalmology;  Laterality: Bilateral;    Social History Social History   Tobacco Use  . Smoking status: Former Smoker    Packs/day: 2.00    Years: 20.00    Pack years: 40.00    Types: Cigarettes    Start date: 11/20/1948    Quit date: 08/19/1980    Years since quitting: 38.5  . Smokeless tobacco: Former Systems developer    Types: Chew  . Tobacco comment: 01/20/2013 "quit chewing 20 yr ago"  Substance Use Topics  . Alcohol use: No    Comment: 01/20/2013 "quit drinking  08/19/1980"  . Drug use: No    Family History Family History  Problem Relation Age of Onset  . Heart disease Mother   . Heart attack Mother   . Heart disease Father   . Heart attack Father   . Cancer Sister        breast  . Cancer Sister        lung and ovarian  No family history of bleeding/clotting disorders, porphyria or autoimmune disease   Allergies  Allergen Reactions  . Aspirin     In high doses  . Sulfonamide Derivatives     REACTION: pruitis patient cant remember its been so long    . Nitrofurantoin Rash     REVIEW OF SYSTEMS (Negative unless checked)  Constitutional: [] Weight loss  [] Fever  [] Chills Cardiac: [] Chest pain   [] Chest pressure   [] Palpitations   [] Shortness of breath when laying flat   [] Shortness of breath with exertion. Vascular:  [x] Pain in legs with walking   [x] Pain in legs at rest  [] History of DVT   [] Phlebitis   [] Swelling in legs   [] Varicose veins   [x] Non-healing ulcers Pulmonary:   [] Uses home oxygen   [] Productive cough   [] Hemoptysis   [] Wheeze  [] COPD   [] Asthma Neurologic:  [] Dizziness   [] Seizures   [] History of stroke   [] History of TIA  [] Aphasia   [] Vissual changes   [] Weakness or numbness in arm   [] Weakness or numbness in leg Musculoskeletal:   [] Joint swelling   [] Joint pain   [] Low back pain Hematologic:  [] Easy bruising  [] Easy bleeding   [] Hypercoagulable state   [] Anemic Gastrointestinal:  [] Diarrhea   [] Vomiting  [] Gastroesophageal reflux/heartburn   [] Difficulty swallowing. Genitourinary:  [] Chronic kidney disease   [] Difficult urination  [] Frequent urination   [] Blood in urine Skin:  [] Rashes   [] Ulcers  Psychological:  [] History of anxiety   []  History of major depression.  Physical Examination  There were no vitals filed for this visit. There is no height or weight on file to calculate BMI. Gen: WD/WN, NAD; seen in a wheelchair Head: [place]/AT, No temporalis wasting.  Ear/Nose/Throat: Hearing grossly intact, nares w/o  erythema or drainage, poor dentition Eyes: PER, EOMI, sclera nonicteric.  Neck: Supple, no masses.  No bruit or JVD.  Pulmonary:  Good air movement, clear to auscultation bilaterally, no use of accessory muscles.  Cardiac: RRR, normal S1, S2, no Murmurs. Vascular: multiple ulcers both feet bilateral heel and toe ulcers >5 sec cap refill Vessel Right Left  Radial Palpable Palpable  PT Palpable Palpable  DP Palpable Palpable  Gastrointestinal: soft, non-distended. No guarding/no peritoneal signs.  Musculoskeletal: M/S 5/5 throughout.  No deformity or atrophy.  Neurologic: CN 2-12 intact. Pain and light touch intact in extremities.  Symmetrical.  Speech is fluent. Motor exam as  listed above. Psychiatric: Judgment intact, Mood & affect appropriate for pt's clinical situation. Dermatologic: No rashes but + ulcers noted.  No changes consistent with cellulitis. Lymph : No Cervical lymphadenopathy, no lichenification or skin changes of chronic lymphedema.  CBC Lab Results  Component Value Date   WBC 13.3 (H) 03/31/2018   HGB 12.6 (L) 03/31/2018   HCT 37.6 03/31/2018   MCV 88 03/31/2018   PLT 271 03/31/2018    BMET    Component Value Date/Time   NA 141 03/31/2018 1135   K 4.8 03/31/2018 1135   CL 103 03/31/2018 1135   CO2 19 (L) 03/31/2018 1135   GLUCOSE 67 03/31/2018 1135   GLUCOSE 249 (H) 09/22/2013 1610   BUN 20 03/31/2018 1135   CREATININE 1.70 (H) 03/10/2019 1459   CREATININE 1.58 (H) 08/19/2012 1044   CALCIUM 9.2 03/31/2018 1135   GFRNONAA 41 (L) 03/31/2018 1135   GFRNONAA 41 (L) 08/19/2012 1044   GFRAA 48 (L) 03/31/2018 1135   GFRAA 47 (L) 08/19/2012 1044   CrCl cannot be calculated (Unknown ideal weight.).  COAG Lab Results  Component Value Date   INR 1.3 11/05/2006    Radiology DG Os Calcis Right  Result Date: 02/17/2019 CLINICAL DATA:  Nonhealing wound right heel EXAM: RIGHT OS CALCIS - 2+ VIEW COMPARISON:  Right foot series today FINDINGS: There appears to  be soft tissue wound posteriorly overlying the posterior right calcaneus. Lucency noted in the posterior calcaneus concerning for osteomyelitis. No acute fracture, subluxation or dislocation. Diffuse severe vascular calcifications. IMPRESSION: Lucency in the posterior calcaneus concerning for osteomyelitis. Electronically Signed   By: Rolm Baptise M.D.   On: 02/17/2019 13:25   MR FOOT RIGHT W WO CONTRAST  Result Date: 03/10/2019 CLINICAL DATA:  Skin ulceration on the right heel for 4 months. EXAM: MRI OF THE RIGHT FOREFOOT WITHOUT AND WITH CONTRAST TECHNIQUE: Multiplanar, multisequence MR imaging of the ankle was performed before and after the administration of intravenous contrast. CONTRAST:  7.5 mL GADAVIST IV COMPARISON:  Plain films the right heel 02/17/2019. FINDINGS: TENDONS Peroneal: Intact. Posteromedial: Intact. Anterior: Intact. Achilles: There is edema, and thickening of the distal 5 cm of the tendon. Mild enhancement is seen in the distal fibers of the tendon. The tendon is partially torn from the calcaneus without retraction. There is complex fluid in the retrocalcaneal bursa measuring approximately 1.2 cm in diameter. Plantar Fascia: Intact. LIGAMENTS Lateral: Intact. Medial: Intact. CARTILAGE Ankle Joint: No joint effusion or osteochondral lesion of the talar dome. Mild degenerative change noted. Subtalar Joints/Sinus Tarsi: Negative. Bones: Bony destructive change is seen in the posterior aspect of the calcaneus deep to the patient's skin ulceration. Marrow edema and enhancement are seen in the calcaneus extending to almost the calcaneocuboid joint consistent with osteomyelitis. Mild marrow edema and enhancement are seen in the neck of the talus and navicular. Scattered degenerative change appears worst at the fourth and fifth tarsometatarsal joints. Other: Subcutaneous edema and enhancement are present about the ankle. Susceptibility artifact about the lower leg correlate with surgical clips  seen on prior plain film. IMPRESSION: IMPRESSION Findings consistent with osteomyelitis in the calcaneus which is worst in the posterior body at the Achilles tendon insertion. Severe Achilles tendinopathy with a partial tear of the tendon. There is a mild enhancement the distal fibers of the tendon worrisome for infection. Complex fluid in the retrocalcaneal bursa worrisome for septic bursitis. Negative for septic joint. Patchy, mild marrow edema in talus, navicular and bones of the midfoot  is likely due to stress reactive change. Electronically Signed   By: Inge Rise M.D.   On: 03/10/2019 15:49   DG Foot Complete Right  Result Date: 02/17/2019 CLINICAL DATA:  Nonhealing wound EXAM: RIGHT FOOT COMPLETE - 3+ VIEW COMPARISON:  Os calcis today FINDINGS: There appears to be soft tissue wound posteriorly overlying the posterior right calcaneus. Lucency noted in the posterior calcaneus concerning for osteomyelitis. Diffuse degenerative changes throughout the foot. No acute fracture, subluxation or dislocation. Diffuse severe vascular calcifications. IMPRESSION: Lucency in the posterior calcaneus concerning for osteomyelitis. Electronically Signed   By: Rolm Baptise M.D.   On: 02/17/2019 13:24   VAS [place] ABI WITH/WO TBI  Result Date: 03/01/2019 LOWER EXTREMITY DOPPLER STUDY Indications: Peripheral artery disease, and Right heel wound slow healing.  Performing Technologist: Concha Norway RVT  Examination Guidelines: A complete evaluation includes at minimum, Doppler waveform signals and systolic blood pressure reading at the level of bilateral brachial, anterior tibial, and posterior tibial arteries, when vessel segments are accessible. Bilateral testing is considered an integral part of a complete examination. Photoelectric Plethysmograph (PPG) waveforms and toe systolic pressure readings are included as required and additional duplex testing as needed. Limited examinations for reoccurring indications may be  performed as noted.  ABI Findings: +---------+------------------+-----+----------+--------+ Right    Rt Pressure (mmHg)IndexWaveform  Comment  +---------+------------------+-----+----------+--------+ Brachial 116                                       +---------+------------------+-----+----------+--------+ ATA                             monophasicNC       +---------+------------------+-----+----------+--------+ PTA                             monophasicNC       +---------+------------------+-----+----------+--------+ Great Toe45                0.39 Abnormal           +---------+------------------+-----+----------+--------+ +---------+------------------+-----+--------+-------+ Left     Lt Pressure (mmHg)IndexWaveformComment +---------+------------------+-----+--------+-------+ ATA                                     [place]      +---------+------------------+-----+--------+-------+ PTA                                     [place]      +---------+------------------+-----+--------+-------+ Great Toe42                0.36 Abnormal        +---------+------------------+-----+--------+-------+ +-------+-----------+-----------+------------+------------+ ABI/TBIToday's ABIToday's TBIPrevious ABIPrevious TBI +-------+-----------+-----------+------------+------------+ Right  [place]         .39                                 +-------+-----------+-----------+------------+------------+ Left   [place]         .36                                 +-------+-----------+-----------+------------+------------+  Summary: Right: Resting right ankle-brachial index indicates noncompressible right lower extremity arteries. The right toe-brachial index is abnormal. TBI suggest moderate disease. Left: Resting left ankle-brachial index indicates noncompressible left lower extremity arteries. The left toe-brachial index is abnormal. TBI suggest moderate disease.  *See table(s) above for  measurements and observations.  Electronically signed by Leotis Pain MD on 03/01/2019 at 9:11:01 AM.   Final      Assessment/Plan 1. Atherosclerosis of native arteries of the extremities with ulceration ([place])  Recommend:  The patient has evidence of severe atherosclerotic changes of both lower extremities associated with ulceration and tissue loss of both feet.  This represents a limb threatening ischemia and places the patient at the risk for limb loss.  Patient should undergo angiography of the right lower extremity first and then the left with the hope for intervention for limb salvage.  The risks and benefits as well as the alternative therapies was discussed in detail with the patient.  All questions were answered.  Patient agrees to proceed with right leg angiography.  The patient will follow up with me in the office after the procedure.    2. Essential hypertension Continue antihypertensive medications as already ordered, these medications have been reviewed and there are no changes at this time.   3. Atherosclerosis of native coronary artery of native heart without angina pectoris Continue cardiac and antihypertensive medications as already ordered and reviewed, no changes at this time.  Continue statin as ordered and reviewed, no changes at this time  Nitrates PRN for chest pain   4. Type 2 diabetes mellitus with diabetic nephropathy, unspecified whether long term insulin use ([place]) Continue hypoglycemic medications as already ordered, these medications have been reviewed and there are no changes at this time.  Hgb A1C to be monitored as already arranged by primary service     Hortencia Pilar, MD  03/14/2019 10:52 AM

## 2019-03-14 NOTE — Progress Notes (Signed)
MOROCCO, GIPE ([id number]) Visit Report for 03/10/2019 Arrival Information Details Patient Name: [name], [name]. Date of Service: 03/10/2019 12:45 PM Medical Record Number: [id number] Patient Account Number: [id number] Date of Birth/Sex: Dec 13, 1930 (86 y.o. M) Treating RN: Army Melia Primary Care Allard Lightsey: Caryl Pina Other Clinician: Referring Verdis Koval: Caryl Pina Treating Salahuddin Arismendez/Extender: Melburn Hake, [name]: 5 Visit Information History Since Last Visit Added or deleted any medications: No Patient Arrived: Wheel Chair Any new allergies or adverse reactions: No Arrival Time: 12:48 Had a fall or experienced change in No Accompanied By: son activities of daily living that may affect Transfer Assistance: None risk of falls: Patient Identification Verified: Yes Signs or symptoms of abuse/neglect since last visito No Secondary Verification Process Completed: Yes Hospitalized since last visit: No Implantable device outside of the clinic excluding No cellular tissue based products placed in the center since last visit: Has Dressing in Place as Prescribed: Yes Pain Present Now: Yes Electronic Signature(s) Signed: 03/14/2019 11:15:10 AM By: Lorine Bears RCP, RRT, CHT Entered By: Lorine Bears on 03/10/2019 12:49:09 Minnie, [name] ([id number]) -------------------------------------------------------------------------------- Clinic Level of Care Assessment Details Patient Name: [name]. Date of Service: 03/10/2019 12:45 PM Medical Record Number: [id number] Patient Account Number: [id number] Date of Birth/Sex: April 28, 1930 (86 y.o. M) Treating RN: Army Melia Primary Care Yaeko Fazekas: Caryl Pina Other Clinician: Referring Garnie Borchardt: Caryl Pina Treating Salil Raineri/Extender: Melburn Hake, [name]: 5 Clinic Level of Care Assessment Items TOOL 4 Quantity Score []  - Use when only an EandM is  performed on FOLLOW-UP visit 0 ASSESSMENTS - Nursing Assessment / Reassessment X - Reassessment of Co-morbidities (includes updates in patient status) 1 10 X- 1 5 Reassessment of Adherence to Treatment Plan ASSESSMENTS - Wound and Skin Assessment / Reassessment []  - Simple Wound Assessment / Reassessment - one wound 0 X- 7 5 Complex Wound Assessment / Reassessment - multiple wounds []  - 0 Dermatologic / Skin Assessment (not related to wound area) ASSESSMENTS - Focused Assessment []  - Circumferential Edema Measurements - multi extremities 0 []  - 0 Nutritional Assessment / Counseling / Intervention []  - 0 Lower Extremity Assessment (monofilament, tuning fork, pulses) []  - 0 Peripheral Arterial Disease Assessment (using hand held doppler) ASSESSMENTS - Ostomy and/or Continence Assessment and Care []  - Incontinence Assessment and Management 0 []  - 0 Ostomy Care Assessment and Management (repouching, etc.) PROCESS - Coordination of Care X - Simple Patient / Family Education for ongoing care 1 15 []  - 0 Complex (extensive) Patient / Family Education for ongoing care []  - 0 Staff obtains Programmer, systems, Records, Test Results / Process Orders []  - 0 Staff telephones HHA, Nursing Homes / Clarify orders / etc []  - 0 Routine Transfer to another Facility (non-emergent condition) []  - 0 Routine Hospital Admission (non-emergent condition) []  - 0 New Admissions / Biomedical engineer / Ordering NPWT, Apligraf, etc. []  - 0 Emergency Hospital Admission (emergent condition) X- 1 10 Simple Discharge Coordination NAJEH, CREDIT ([id number]) []  - 0 Complex (extensive) Discharge Coordination PROCESS - Special Needs []  - Pediatric / Minor Patient Management 0 []  - 0 Isolation Patient Management []  - 0 Hearing / Language / Visual special needs []  - 0 Assessment of Community assistance (transportation, D/C planning, etc.) []  - 0 Additional assistance / Altered mentation []  - 0 Support  Surface(s) Assessment (bed, cushion, seat, etc.) INTERVENTIONS - Wound Cleansing / Measurement []  - Simple Wound Cleansing - one wound 0 X- 7 5 Complex Wound Cleansing - multiple wounds []  -  0 Wound Imaging (photographs - any number of wounds) []  - 0 Wound Tracing (instead of photographs) []  - 0 Simple Wound Measurement - one wound X- 7 5 Complex Wound Measurement - multiple wounds INTERVENTIONS - Wound Dressings []  - Small Wound Dressing one or multiple wounds 0 X- 7 15 Medium Wound Dressing one or multiple wounds []  - 0 Large Wound Dressing one or multiple wounds []  - 0 Application of Medications - topical []  - 0 Application of Medications - injection INTERVENTIONS - Miscellaneous []  - External ear exam 0 []  - 0 Specimen Collection (cultures, biopsies, blood, body fluids, etc.) []  - 0 Specimen(s) / Culture(s) sent or taken to Lab for analysis []  - 0 Patient Transfer (multiple staff / Civil Service fast streamer / Similar devices) []  - 0 Simple Staple / Suture removal (25 or less) []  - 0 Complex Staple / Suture removal (26 or more) []  - 0 Hypo / Hyperglycemic Management (close monitor of Blood Glucose) []  - 0 Ankle / Brachial Index (ABI) - do not check if billed separately X- 1 5 Vital Signs Schneiderman, Niquan J. ([id number]) Has the patient been seen at the hospital within the last three years: Yes Total Score: 255 Level Of Care: New/Established - Level 5 Electronic Signature(s) Signed: 03/10/2019 2:34:43 PM By: Army Melia Entered By: Army Melia on 03/10/2019 13:21:04 Jacqualyn Posey ([id number]) -------------------------------------------------------------------------------- Encounter Discharge Information Details Patient Name: [name]. Date of Service: 03/10/2019 12:45 PM Medical Record Number: [id number] Patient Account Number: [id number] Date of Birth/Sex: 24-May-1930 (86 y.o. M) Treating RN: Army Melia Primary Care Tahjai Schetter: Caryl Pina Other  Clinician: Referring Brook Geraci: Caryl Pina Treating Schyler Butikofer/Extender: Melburn Hake, [name]: 5 Encounter Discharge Information Items Discharge Condition: Stable Ambulatory Status: Wheelchair Discharge Destination: Home Transportation: Private Auto Accompanied By: family Schedule Follow-up Appointment: Yes Clinical Summary of Care: Electronic Signature(s) Signed: 03/10/2019 2:34:43 PM By: Army Melia Entered By: Army Melia on 03/10/2019 13:21:57 Haberkorn, [name] ([id number]) -------------------------------------------------------------------------------- Lower Extremity Assessment Details Patient Name: REUBIN, BUSHNELL. Date of Service: 03/10/2019 12:45 PM Medical Record Number: [id number] Patient Account Number: [id number] Date of Birth/Sex: [birth year] (86 y.o. M) Treating RN: Montey Hora Primary Care Latonyia Lopata: Caryl Pina Other Clinician: Referring Oluwatimilehin Balfour: Caryl Pina Treating Nikaela Coyne/Extender: STONE III, [name]: 5 Edema Assessment Assessed: [Left: No] [Right: No] Edema: [Left: No] [Right: No] Vascular Assessment Pulses: Dorsalis Pedis Palpable: [Left:No] [Right:No] Doppler Audible: [Left:Yes] [Right:Yes] Posterior Tibial Palpable: [Left:No Yes] [Right:No Yes] Electronic Signature(s) Signed: 03/10/2019 2:52:01 PM By: Montey Hora Entered By: Montey Hora on 03/10/2019 13:06:28 Jacqualyn Posey ([id number]) -------------------------------------------------------------------------------- Multi Wound Chart Details Patient Name: ELKIN, BELFIELD. Date of Service: 03/10/2019 12:45 PM Medical Record Number: [id number] Patient Account Number: [id number] Date of Birth/Sex: [birth year] (86 y.o. M) Treating RN: Army Melia Primary Care Basma Buchner: Caryl Pina Other Clinician: Referring Ladrea Holladay: Caryl Pina Treating Rameen Quinney/Extender: Melburn Hake, [name]: 5 Vital Signs Height(in):  68 Pulse(bpm): 38 Weight(lbs): 235 Blood Pressure(mmHg): 128/62 Body Mass Index(BMI): 36 Temperature(F): 97.6 Respiratory Rate 18 (breaths/min): Photos: Wound Location: Right Calcaneus Right Toe Fourth Right Toe Fifth Wounding Event: Gradually Appeared Gradually Appeared Pressure Injury Primary Etiology: Pressure Ulcer Pressure Ulcer Pressure Ulcer Comorbid History: Cataracts, Hypertension, Cataracts, Hypertension, Cataracts, Hypertension, Myocardial Infarction, Type II Myocardial Infarction, Type II Myocardial Infarction, Type II Diabetes, Osteoarthritis, Diabetes, Osteoarthritis, Diabetes, Osteoarthritis, Neuropathy Neuropathy Neuropathy Date Acquired: 12/05/2018 09/05/2018 09/05/2018 Weeks of Treatment: 5 5 5  Wound Status: Open Open Open Measurements L x W x D 6.3x4.2x0.5 0.2x0.3x0.1 0.8x0.4x0.1 (  cm) Area (cm) : 20.782 0.047 0.251 Volume (cm) : 10.391 0.005 0.025 % Reduction in Area: -195.30% 50.00% 20.10% % Reduction in Volume: -195.30% 44.40% 19.40% Classification: Unstageable/Unclassified Unstageable/Unclassified Unstageable/Unclassified Exudate Amount: Medium None Present None Present Exudate Type: Serosanguineous N/A N/A Exudate Color: red, brown N/A N/A Wound Margin: Flat and Intact Flat and Intact Flat and Intact Granulation Amount: None Present (0%) None Present (0%) None Present (0%) Granulation Quality: N/A N/A N/A Necrotic Amount: Large (67-100%) Large (67-100%) Large (67-100%) Necrotic Tissue: Eschar, Adherent Slough Eschar Eschar Exposed Structures: Fat Layer (Subcutaneous Fat Layer (Subcutaneous Fat Layer (Subcutaneous Tissue) Exposed: Yes Tissue) Exposed: Yes Tissue) Exposed: Yes Fascia: No Fascia: No Fascia: No Tendon: No Tendon: No Tendon: No DEAVIN, FORST ([id number]) Muscle: No Muscle: No Muscle: No Joint: No Joint: No Joint: No Bone: No Bone: No Bone: No Epithelialization: None None None Wound Number: 4 5 6  Photos: Wound Location:  Left Toe Third - Lateral Left Toe Fourth - Medial Left Toe Fourth - Lateral Wounding Event: Gradually Appeared Gradually Appeared Gradually Appeared Primary Etiology: Diabetic Wound/Ulcer of the Diabetic Wound/Ulcer of the Diabetic Wound/Ulcer of the Lower Extremity Lower Extremity Lower Extremity Comorbid History: Cataracts, Hypertension, Cataracts, Hypertension, Cataracts, Hypertension, Myocardial Infarction, Type II Myocardial Infarction, Type II Myocardial Infarction, Type II Diabetes, Osteoarthritis, Diabetes, Osteoarthritis, Diabetes, Osteoarthritis, Neuropathy Neuropathy Neuropathy Date Acquired: 03/03/2019 03/03/2019 03/03/2019 Weeks of Treatment: 1 1 1  Wound Status: Open Open Open Measurements L x W x D 0.5x0.4x0.1 0.4x0.5x0.1 1.1x0.9x0.2 (cm) Area (cm) : 0.157 0.157 0.778 Volume (cm) : 0.016 0.016 0.156 % Reduction in Area: -67.00% 4.80% 36.50% % Reduction in Volume: -77.80% 0.00% 36.30% Classification: Grade 1 Grade 1 Grade 1 Exudate Amount: Medium Medium Medium Exudate Type: Purulent Purulent Purulent Exudate Color: yellow, brown, green yellow, brown, green yellow, brown, green Wound Margin: Flat and Intact Flat and Intact Flat and Intact Granulation Amount: None Present (0%) Small (1-33%) None Present (0%) Granulation Quality: N/A Pink N/A Necrotic Amount: Large (67-100%) Large (67-100%) Large (67-100%) Necrotic Tissue: Eschar, Adherent Slough Eschar, Adherent Slough Eschar, Adherent Slough Exposed Structures: Fat Layer (Subcutaneous Fat Layer (Subcutaneous Fat Layer (Subcutaneous Tissue) Exposed: Yes Tissue) Exposed: Yes Tissue) Exposed: Yes Fascia: No Fascia: No Fascia: No Tendon: No Tendon: No Tendon: No Muscle: No Muscle: No Muscle: No Joint: No Joint: No Joint: No Bone: No Bone: No Bone: No Epithelialization: None None None Wound Number: 7 N/A N/A Photos: N/A N/A PHU, RECORD ([id number]) Wound Location: Left Toe Fifth - Medial N/A N/A Wounding  Event: Gradually Appeared N/A N/A Primary Etiology: Diabetic Wound/Ulcer of the N/A N/A Lower Extremity Comorbid History: Cataracts, Hypertension, N/A N/A Myocardial Infarction, Type II Diabetes, Osteoarthritis, Neuropathy Date Acquired: 03/03/2019 N/A N/A Weeks of Treatment: 1 N/A N/A Wound Status: Open N/A N/A Measurements L x W x D 1.6x0.7x0.1 N/A N/A (cm) Area (cm) : 0.88 N/A N/A Volume (cm) : 0.088 N/A N/A % Reduction in Area: 15.10% N/A N/A % Reduction in Volume: 15.40% N/A N/A Classification: Grade 1 N/A N/A Exudate Amount: Medium N/A N/A Exudate Type: Purulent N/A N/A Exudate Color: yellow, brown, green N/A N/A Wound Margin: Flat and Intact N/A N/A Granulation Amount: None Present (0%) N/A N/A Granulation Quality: N/A N/A N/A Necrotic Amount: Large (67-100%) N/A N/A Necrotic Tissue: Eschar, Adherent Slough N/A N/A Exposed Structures: Fat Layer (Subcutaneous N/A N/A Tissue) Exposed: Yes Fascia: No Tendon: No Muscle: No Joint: No Bone: No Epithelialization: None N/A N/A Treatment Notes Electronic Signature(s) Signed: 03/10/2019 2:34:43 PM By: Nicki Reaper,  Dajea Entered By: Army Melia on 03/10/2019 13:17:58 Juarez, [name] ([id number]) -------------------------------------------------------------------------------- Multi-Disciplinary Care Plan Details Patient Name: BRITT, PETRONI. Date of Service: 03/10/2019 12:45 PM Medical Record Number: [id number] Patient Account Number: [id number] Date of Birth/Sex: 1930/09/17 (86 y.o. M) Treating RN: Army Melia Primary Care Davelyn Gwinn: Caryl Pina Other Clinician: Referring Shayle Donahoo: Caryl Pina Treating Shaeleigh Graw/Extender: Melburn Hake, [name]: 5 Active Inactive Orientation to the Wound Care Program Nursing Diagnoses: Knowledge deficit related to the wound healing center program Goals: Patient/caregiver will verbalize understanding of the [facility] Date Initiated:  02/03/2019 Target Resolution Date: 03/04/2019 Goal Status: Active Interventions: Provide education on orientation to the wound center Notes: Pressure Nursing Diagnoses: Knowledge deficit related to causes and risk factors for pressure ulcer development Goals: Patient/caregiver will verbalize risk factors for pressure ulcer development Date Initiated: 02/03/2019 Target Resolution Date: 03/04/2019 Goal Status: Active Interventions: Assess: immobility, friction, shearing, incontinence upon admission and as needed Notes: Wound/Skin Impairment Nursing Diagnoses: Impaired tissue integrity Goals: Ulcer/skin breakdown will have a volume reduction of 30% by week 4 Date Initiated: 02/03/2019 Target Resolution Date: 03/04/2019 Goal Status: Active Interventions: Assess ulceration(s) every visit CAMBRIDGE, DELEO ([id number]) Notes: Electronic Signature(s) Signed: 03/10/2019 2:34:43 PM By: Army Melia Entered By: Army Melia on 03/10/2019 13:17:47 Schicker, [name] ([id number]) -------------------------------------------------------------------------------- Pain Assessment Details Patient Name: JAEVEN, WANZER. Date of Service: 03/10/2019 12:45 PM Medical Record Number: [id number] Patient Account Number: [id number] Date of Birth/Sex: 29-Jun-1930 (86 y.o. M) Treating RN: Army Melia Primary Care Edwardo Wojnarowski: Caryl Pina Other Clinician: Referring Nariah Morgano: Caryl Pina Treating Jazzmen Restivo/Extender: Melburn Hake, [name]: 5 Active Problems Location of Pain Severity and Description of Pain Patient Has Paino Yes Site Locations Rate the pain. Current Pain Level: 1 Pain Management and Medication Current Pain Management: Electronic Signature(s) Signed: 03/10/2019 2:34:43 PM By: Army Melia Signed: 03/14/2019 11:15:10 AM By: Becky Sax, Sallie RCP, RRT, CHT Entered By: Lorine Bears on 03/10/2019 12:49:19 Besaw, [name]  ([id number]) -------------------------------------------------------------------------------- Patient/Caregiver Education Details Patient Name: DASHUN, BORRE. Date of Service: 03/10/2019 12:45 PM Medical Record Number: [id number] Patient Account Number: [id number] Date of Birth/Gender: December 19, 1930 (86 y.o. M) Treating RN: Army Melia Primary Care Physician: Caryl Pina Other Clinician: Referring Physician: Caryl Pina Treating Physician/Extender: Sharalyn Ink in Treatment: 5 Education Assessment Education Provided To: Patient Education Topics Provided Wound/Skin Impairment: Handouts: Caring for Your Ulcer Methods: Demonstration, Explain/Verbal Responses: State content correctly Electronic Signature(s) Signed: 03/10/2019 2:34:43 PM By: Army Melia Entered By: Army Melia on 03/10/2019 13:21:14 Maselli, [name] ([id number]) -------------------------------------------------------------------------------- Wound Assessment Details Patient Name: MONIQUE, GIFT. Date of Service: 03/10/2019 12:45 PM Medical Record Number: [id number] Patient Account Number: [id number] Date of Birth/Sex: 1931-02-07 (86 y.o. M) Treating RN: Montey Hora Primary Care Shaneese Tait: Caryl Pina Other Clinician: Referring Elizabelle Fite: Caryl Pina Treating Telvin Reinders/Extender: Melburn Hake, [name]: 5 Wound Status Wound Number: 1 Primary Pressure Ulcer Etiology: Wound Location: Right Calcaneus Wound Open Wounding Event: Gradually Appeared Status: Date Acquired: 12/05/2018 Comorbid Cataracts, Hypertension, Myocardial Infarction, Weeks Of Treatment: 5 History: Type II Diabetes, Osteoarthritis, Neuropathy Clustered Wound: No Photos Wound Measurements Length: (cm) 6.3 % Reduction i Width: (cm) 4.2 % Reduction i Depth: (cm) 0.5 Epithelializa Area: (cm) 20.782 Tunneling: Volume: (cm) 10.391 Undermining: n Area: -195.3% n Volume: -195.3% tion:  None No No Wound Description Classification: Unstageable/Unclassified Foul Odor Af Wound Margin: Flat and Intact Slough/Fibri Exudate Amount: Medium Exudate Type: Serosanguineous Exudate Color: red, brown ter Cleansing: No no Yes  Wound Bed Granulation Amount: None Present (0%) Exposed Structure Necrotic Amount: Large (67-100%) Fascia Exposed: No Necrotic Quality: Eschar, Adherent Slough Fat Layer (Subcutaneous Tissue) Exposed: Yes Tendon Exposed: No Muscle Exposed: No Joint Exposed: No Bone Exposed: No Treatment Notes JAKAYDEN, CANCIO ([id number]) Wound #1 (Right Calcaneus) Notes betadine to toes, iodoflex ABD conform to right heel Electronic Signature(s) Signed: 03/10/2019 2:52:01 PM By: Montey Hora Entered By: Montey Hora on 03/10/2019 13:06:53 Atlas, [name] ([id number]) -------------------------------------------------------------------------------- Wound Assessment Details Patient Name: JAXYN, ROUT. Date of Service: 03/10/2019 12:45 PM Medical Record Number: [id number] Patient Account Number: [id number] Date of Birth/Sex: 06/27/1930 (86 y.o. M) Treating RN: Montey Hora Primary Care Cloee Dunwoody: Caryl Pina Other Clinician: Referring Tylerjames Hoglund: Caryl Pina Treating Neave Lenger/Extender: Melburn Hake, [name]: 5 Wound Status Wound Number: 2 Primary Pressure Ulcer Etiology: Wound Location: Right Toe Fourth Wound Open Wounding Event: Gradually Appeared Status: Date Acquired: 09/05/2018 Comorbid Cataracts, Hypertension, Myocardial Infarction, Weeks Of Treatment: 5 History: Type II Diabetes, Osteoarthritis, Neuropathy Clustered Wound: No Photos Wound Measurements Length: (cm) 0.2 % Reduction in Width: (cm) 0.3 % Reduction in Depth: (cm) 0.1 Epithelializat Area: (cm) 0.047 Tunneling: Volume: (cm) 0.005 Undermining: Area: 50% Volume: 44.4% ion: None No No Wound Description Classification: Unstageable/Unclassified Foul  Odor Aft Wound Margin: Flat and Intact Slough/Fibrin Exudate Amount: None Present er Cleansing: No o No Wound Bed Granulation Amount: None Present (0%) Exposed Structure Necrotic Amount: Large (67-100%) Fascia Exposed: No Necrotic Quality: Eschar Fat Layer (Subcutaneous Tissue) Exposed: Yes Tendon Exposed: No Muscle Exposed: No Joint Exposed: No Bone Exposed: No Treatment Notes Wound #2 (Right Toe Fourth) Notes PHEONIX, CLINKSCALE. ([id number]) betadine to toes, iodoflex ABD conform to right heel Electronic Signature(s) Signed: 03/10/2019 2:52:01 PM By: Montey Hora Entered By: Montey Hora on 03/10/2019 13:07:16 Fasnacht, [name] ([id number]) -------------------------------------------------------------------------------- Wound Assessment Details Patient Name: JENESIS, SUCHY. Date of Service: 03/10/2019 12:45 PM Medical Record Number: [id number] Patient Account Number: [id number] Date of Birth/Sex: 11-Jul-1930 (86 y.o. M) Treating RN: Montey Hora Primary Care Lurlene Ronda: Caryl Pina Other Clinician: Referring Rhian Funari: Caryl Pina Treating Kody Vigil/Extender: Melburn Hake, [name]: 5 Wound Status Wound Number: 3 Primary Pressure Ulcer Etiology: Wound Location: Right Toe Fifth Wound Open Wounding Event: Pressure Injury Status: Date Acquired: 09/05/2018 Comorbid Cataracts, Hypertension, Myocardial Infarction, Weeks Of Treatment: 5 History: Type II Diabetes, Osteoarthritis, Neuropathy Clustered Wound: No Photos Wound Measurements Length: (cm) 0.8 % Reduction i Width: (cm) 0.4 % Reduction i Depth: (cm) 0.1 Epithelializa Area: (cm) 0.251 Tunneling: Volume: (cm) 0.025 Undermining: n Area: 20.1% n Volume: 19.4% tion: None No No Wound Description Classification: Unstageable/Unclassified Foul Odor Af Wound Margin: Flat and Intact Slough/Fibri Exudate Amount: None Present ter Cleansing: No no No Wound Bed Granulation Amount: None Present  (0%) Exposed Structure Necrotic Amount: Large (67-100%) Fascia Exposed: No Necrotic Quality: Eschar Fat Layer (Subcutaneous Tissue) Exposed: Yes Tendon Exposed: No Muscle Exposed: No Joint Exposed: No Bone Exposed: No Treatment Notes Wound #3 (Right Toe Fifth) Notes Ferrell, KYZER BLOWE. ([id number]) betadine to toes, iodoflex ABD conform to right heel Electronic Signature(s) Signed: 03/10/2019 1:08:53 PM By: Montey Hora Entered By: Montey Hora on 03/10/2019 13:08:52 Clenney, [name] ([id number]) -------------------------------------------------------------------------------- Wound Assessment Details Patient Name: Jacqualyn Posey. Date of Service: 03/10/2019 12:45 PM Medical Record Number: [id number] Patient Account Number: [id number] Date of Birth/Sex: 1931/01/08 (86 y.o. M) Treating RN: Montey Hora Primary Care Jan Walters: Caryl Pina Other Clinician: Referring Cheron Coryell: Caryl Pina Treating Cutler Sunday/Extender: STONE III, [name]: 5 Wound  Status Wound Number: 4 Primary Diabetic Wound/Ulcer of the Lower Extremity Etiology: Wound Location: Left Toe Third - Lateral Wound Open Wounding Event: Gradually Appeared Status: Date Acquired: 03/03/2019 Comorbid Cataracts, Hypertension, Myocardial Infarction, Weeks Of Treatment: 1 History: Type II Diabetes, Osteoarthritis, Neuropathy Clustered Wound: No Photos Wound Measurements Length: (cm) 0.5 % Reduction Width: (cm) 0.4 % Reduction Depth: (cm) 0.1 Epitheliali Area: (cm) 0.157 Tunneling: Volume: (cm) 0.016 Underminin in Area: -67% in Volume: -77.8% zation: None No g: No Wound Description Classification: Grade 1 Foul Odor A Wound Margin: Flat and Intact Slough/Fibr Exudate Amount: Medium Exudate Type: Purulent Exudate Color: yellow, brown, green fter Cleansing: No ino Yes Wound Bed Granulation Amount: None Present (0%) Exposed Structure Necrotic Amount: Large (67-100%) Fascia Exposed:  No Necrotic Quality: Eschar, Adherent Slough Fat Layer (Subcutaneous Tissue) Exposed: Yes Tendon Exposed: No Muscle Exposed: No Joint Exposed: No Bone Exposed: No Treatment Notes VERN, GUERETTE ([id number]) Wound #4 (Left, Lateral Toe Third) Notes betadine to toes, iodoflex ABD conform to right heel Electronic Signature(s) Signed: 03/10/2019 1:09:23 PM By: Montey Hora Entered By: Montey Hora on 03/10/2019 13:09:23 Jacqualyn Posey ([id number]) -------------------------------------------------------------------------------- Wound Assessment Details Patient Name: Jacqualyn Posey. Date of Service: 03/10/2019 12:45 PM Medical Record Number: [id number] Patient Account Number: [id number] Date of Birth/Sex: 10-05-1930 (86 y.o. M) Treating RN: Montey Hora Primary Care Jaliza Seifried: Caryl Pina Other Clinician: Referring Shanard Treto: Caryl Pina Treating Jazlynne Milliner/Extender: STONE III, [name]: 5 Wound Status Wound Number: 5 Primary Diabetic Wound/Ulcer of the Lower Extremity Etiology: Wound Location: Left Toe Fourth - Medial Wound Open Wounding Event: Gradually Appeared Status: Date Acquired: 03/03/2019 Comorbid Cataracts, Hypertension, Myocardial Infarction, Weeks Of Treatment: 1 History: Type II Diabetes, Osteoarthritis, Neuropathy Clustered Wound: No Photos Wound Measurements Length: (cm) 0.4 % Reduction Width: (cm) 0.5 % Reduction Depth: (cm) 0.1 Epitheliali Area: (cm) 0.157 Tunneling: Volume: (cm) 0.016 Underminin in Area: 4.8% in Volume: 0% zation: None No g: No Wound Description Classification: Grade 1 Foul Odor A Wound Margin: Flat and Intact Slough/Fibr Exudate Amount: Medium Exudate Type: Purulent Exudate Color: yellow, brown, green fter Cleansing: No ino Yes Wound Bed Granulation Amount: Small (1-33%) Exposed Structure Granulation Quality: Pink Fascia Exposed: No Necrotic Amount: Large (67-100%) Fat Layer (Subcutaneous  Tissue) Exposed: Yes Necrotic Quality: Eschar, Adherent Slough Tendon Exposed: No Muscle Exposed: No Joint Exposed: No Bone Exposed: No Treatment Notes VERE, DIANTONIO ([id number]) Wound #5 (Left, Medial Toe Fourth) Notes betadine to toes, iodoflex ABD conform to right heel Electronic Signature(s) Signed: 03/10/2019 1:09:49 PM By: Montey Hora Entered By: Montey Hora on 03/10/2019 13:09:49 Feutz, [name] ([id number]) -------------------------------------------------------------------------------- Wound Assessment Details Patient Name: Jacqualyn Posey. Date of Service: 03/10/2019 12:45 PM Medical Record Number: [id number] Patient Account Number: [id number] Date of Birth/Sex: 1930-11-28 (86 y.o. M) Treating RN: Montey Hora Primary Care Ethyl Vila: Caryl Pina Other Clinician: Referring Hanadi Stanly: Caryl Pina Treating Athanasios Heldman/Extender: STONE III, [name]: 5 Wound Status Wound Number: 6 Primary Diabetic Wound/Ulcer of the Lower Extremity Etiology: Wound Location: Left Toe Fourth - Lateral Wound Open Wounding Event: Gradually Appeared Status: Date Acquired: 03/03/2019 Comorbid Cataracts, Hypertension, Myocardial Infarction, Weeks Of Treatment: 1 History: Type II Diabetes, Osteoarthritis, Neuropathy Clustered Wound: No Photos Wound Measurements Length: (cm) 1.1 % Reduction Width: (cm) 0.9 % Reduction Depth: (cm) 0.2 Epitheliali Area: (cm) 0.778 Tunneling: Volume: (cm) 0.156 Underminin in Area: 36.5% in Volume: 36.3% zation: None No g: No Wound Description Classification: Grade 1 Foul Odor A Wound Margin: Flat and Intact Slough/Fibr Exudate  Amount: Medium Exudate Type: Purulent Exudate Color: yellow, brown, green fter Cleansing: No ino Yes Wound Bed Granulation Amount: None Present (0%) Exposed Structure Necrotic Amount: Large (67-100%) Fascia Exposed: No Necrotic Quality: Eschar, Adherent Slough Fat Layer (Subcutaneous Tissue)  Exposed: Yes Tendon Exposed: No Muscle Exposed: No Joint Exposed: No Bone Exposed: No Treatment Notes FLEMON, KELTY ([id number]) Wound #6 (Left, Lateral Toe Fourth) Notes betadine to toes, iodoflex ABD conform to right heel Electronic Signature(s) Signed: 03/10/2019 1:10:14 PM By: Montey Hora Entered By: Montey Hora on 03/10/2019 13:10:13 Hesser, [name] ([id number]) -------------------------------------------------------------------------------- Wound Assessment Details Patient Name: Jacqualyn Posey. Date of Service: 03/10/2019 12:45 PM Medical Record Number: [id number] Patient Account Number: [id number] Date of Birth/Sex: 09-08-1930 (86 y.o. M) Treating RN: Montey Hora Primary Care Taivon Haroon: Caryl Pina Other Clinician: Referring Eniya Cannady: Caryl Pina Treating Zala Degrasse/Extender: STONE III, [name]: 5 Wound Status Wound Number: 7 Primary Diabetic Wound/Ulcer of the Lower Extremity Etiology: Wound Location: Left Toe Fifth - Medial Wound Open Wounding Event: Gradually Appeared Status: Date Acquired: 03/03/2019 Comorbid Cataracts, Hypertension, Myocardial Infarction, Weeks Of Treatment: 1 History: Type II Diabetes, Osteoarthritis, Neuropathy Clustered Wound: No Photos Wound Measurements Length: (cm) 1.6 % Reductio Width: (cm) 0.7 % Reductio Depth: (cm) 0.1 Epithelial Area: (cm) 0.88 Tunneling Volume: (cm) 0.088 Undermini n in Area: 15.1% n in Volume: 15.4% ization: None : No ng: No Wound Description Classification: Grade 1 Foul Odor Wound Margin: Flat and Intact Slough/Fib Exudate Amount: Medium Exudate Type: Purulent Exudate Color: yellow, brown, green After Cleansing: No rino Yes Wound Bed Granulation Amount: None Present (0%) Exposed Structure Necrotic Amount: Large (67-100%) Fascia Exposed: No Necrotic Quality: Eschar, Adherent Slough Fat Layer (Subcutaneous Tissue) Exposed: Yes Tendon Exposed: No Muscle Exposed:  No Joint Exposed: No Bone Exposed: No Treatment Notes TYRICE, HEWITT ([id number]) Wound #7 (Left, Medial Toe Fifth) Notes betadine to toes, iodoflex ABD conform to right heel Electronic Signature(s) Signed: 03/10/2019 1:10:50 PM By: Montey Hora Entered By: Montey Hora on 03/10/2019 13:10:50 Krenz, [name] ([id number]) -------------------------------------------------------------------------------- Vitals Details Patient Name: Jacqualyn Posey. Date of Service: 03/10/2019 12:45 PM Medical Record Number: [id number] Patient Account Number: [id number] Date of Birth/Sex: 1931/02/06 (86 y.o. M) Treating RN: Army Melia Primary Care Regenia Erck: Caryl Pina Other Clinician: Referring Devina Bezold: Caryl Pina Treating Diavian Furgason/Extender: Melburn Hake, [name]: 5 Vital Signs Time Taken: 12:50 Temperature (F): 97.6 Height (in): 68 Pulse (bpm): 84 Weight (lbs): 235 Respiratory Rate (breaths/min): 18 Body Mass Index (BMI): 35.7 Blood Pressure (mmHg): 128/62 Reference Range: 80 - 120 mg / dl Electronic Signature(s) Signed: 03/14/2019 11:15:10 AM By: Lorine Bears RCP, RRT, CHT Entered By: Lorine Bears on 03/10/2019 12:52:05

## 2019-03-15 ENCOUNTER — Encounter (INDEPENDENT_AMBULATORY_CARE_PROVIDER_SITE_OTHER): Payer: Self-pay

## 2019-03-15 ENCOUNTER — Telehealth (INDEPENDENT_AMBULATORY_CARE_PROVIDER_SITE_OTHER): Payer: Self-pay

## 2019-03-15 NOTE — Telephone Encounter (Signed)
Spoke with the patient's son [name] and the patient is now scheduled for a right leg angio with Dr. Delana Meyer on 03/22/19 with a 11:00 am arrival time to the [facility]. Patient will do covid testing on 03/18/19 between 12:30-2:30 pm at the [facility]. Pre-procedure instructions were discussed and will be mailed to the patient. Patient is also scheduled for a left leg angio on 03/29/19 with Dr. Delana Meyer with a 9:00 am arrival time to the [facility].

## 2019-03-16 ENCOUNTER — Telehealth (INDEPENDENT_AMBULATORY_CARE_PROVIDER_SITE_OTHER): Payer: Self-pay

## 2019-03-16 NOTE — Telephone Encounter (Signed)
No message attached

## 2019-03-17 ENCOUNTER — Other Ambulatory Visit: Admission: RE | Admit: 2019-03-17 | Payer: Medicare Other | Source: Ambulatory Visit

## 2019-03-17 ENCOUNTER — Other Ambulatory Visit
Admission: RE | Admit: 2019-03-17 | Discharge: 2019-03-17 | Disposition: A | Discharge status: Home / Self Care | Payer: Medicare Other | Source: Ambulatory Visit | Attending: Vascular Surgery | Admitting: Vascular Surgery

## 2019-03-17 ENCOUNTER — Encounter: Payer: Medicare Other | Admitting: Physician Assistant

## 2019-03-17 ENCOUNTER — Other Ambulatory Visit: Payer: Self-pay

## 2019-03-17 ENCOUNTER — Encounter (INDEPENDENT_AMBULATORY_CARE_PROVIDER_SITE_OTHER): Payer: Medicare Other | Admitting: Vascular Surgery

## 2019-03-17 DIAGNOSIS — L89610 Pressure ulcer of right heel, unstageable: Secondary | ICD-10-CM | POA: Diagnosis not present

## 2019-03-17 DIAGNOSIS — Z20822 Contact with and (suspected) exposure to covid-19: Secondary | ICD-10-CM | POA: Diagnosis not present

## 2019-03-17 DIAGNOSIS — Z01812 Encounter for preprocedural laboratory examination: Secondary | ICD-10-CM | POA: Insufficient documentation

## 2019-03-17 LAB — SARS CORONAVIRUS 2 (TAT 6-24 HRS): SARS Coronavirus 2: NEGATIVE

## 2019-03-17 NOTE — Progress Notes (Addendum)
TAIWAN, TALCOTT ([id number]) Visit Report for 03/17/2019 Chief Complaint Document Details Patient Name: NYHEIM, SEUFERT. Date of Service: 03/17/2019 1:00 PM Medical Record Number: [id number] Patient Account Number: [id number] Date of Birth/Sex: 1930/09/08 (86 y.o. M) Treating RN: Army Melia Primary Care Provider: Caryl Pina Other Clinician: Referring Provider: Caryl Pina Treating Provider/Extender: Melburn Hake, Tobby Fawcett Weeks in Treatment: 6 Information Obtained from: Patient Chief Complaint Heel pressure ulcer and right 4th and 5th toe pressure ulcers along with multiple left toe ulcers Electronic Signature(s) Signed: 03/17/2019 12:52:03 PM By: Worthy Keeler PA-C Entered By: Worthy Keeler on 03/17/2019 12:52:02 Ron, [name] ([id number]) -------------------------------------------------------------------------------- HPI Details Patient Name: YOAN, SALLADE. Date of Service: 03/17/2019 1:00 PM Medical Record Number: [id number] Patient Account Number: [id number] Date of Birth/Sex: 06-30-1930 (86 y.o. M) Treating RN: Army Melia Primary Care Provider: Caryl Pina Other Clinician: Referring Provider: Caryl Pina Treating Provider/Extender: Melburn Hake, Tamberlyn Midgley Weeks in Treatment: 6 History of Present Illness HPI Description: 02/03/2019 patient presents today for initial evaluation in our clinic concerning issues that he has been having with wounds on his right heel as well as the right fourth and fifth toes. The toe ulcers have been present for about 5 months. They are pretty much just dry and eschar covered at this point but appear to be stable. The heel ulcer has been present for about 2 months and is very soft the eschar is loosening and there appears to be purulent drainage as well noted at this point there is also erythema surrounding the wound bed. I am unsure if this is just inflammation or secondary to infection to be perfectly honest. With that being  said the patient has recently gotten Prevalon offloading boots and he is essentially nonambulatory at this point. He did have a coronary artery bypass graft x4 in 1989. Otherwise he tells me that he also has hypertension and urinary incontinence. Obviously that is not playing any role in his wounds currently based on what we are seeing today. 02/10/2019 on evaluation today patient appears to be doing quite well with regard to his wounds. He is not showing a lot of signs of improvement yet but he still has a lot of eschar at this point. He still awaiting the vascular evaluation at this time as well. Fortunately there is no signs of active infection. No fevers, chills, nausea, vomiting, or diarrhea. 02/17/2019 on evaluation today patient appears to be doing quite well with regard to his wound. This seems to be greatly improved compared to last visit I do believe that the cefdinir is doing well for him. Fortunately there is no signs of active infection at this time. No fever chills noted no fevers, chills, nausea, vomiting, or diarrhea. 03/03/2019 on evaluation today patient appears to be doing unfortunately worse even compared to his last visit. His wounds have not progressed in a good way and unfortunately he has multiple new locations on the left foot as well that are giving him trouble. I am obviously concerned about how things are progressing at this point. I did actually have results to review for him today. This included his arterial study which revealed that his ABIs were not obtainable and subsequently his TBI's were very low in the 0.35-0.30 range bilaterally. Subsequently his x-ray also revealed that there are potential changes on the x-ray at the heel on the right that could be associated with osteomyelitis. Nonetheless I do believe that the patient does get a likely require a vascular consult as well as  an MRI to further evaluate the situation. In the meantime I am also going to recommend  refilling the Omnicef to continue treatment as such since that does seem to have been at least somewhat beneficial for him. 03/10/2019 upon evaluation today patient appears to be doing unfortunately a little bit worse in regard to new areas of what appear to be necrotic tissue showing up at multiple places on his lower extremity. I am very concerned about this to be honest. In fact he has an appointment next Thursday with vascular for evaluation. I really want to see if we try to get this moved up sooner I discussed this with the patient he is in agreement with that. He is also going for his MRI today once he leaves the clinic here. Nonetheless I am concerned about his arterial status based on what we are seeing currently. 03/17/2019 on evaluation today patient appears to be doing poorly at this point in regard to his bilateral lower extremities. Unfortunately he is having new wounds show up each time I see him we have been seeing him on a weekly schedule. Subsequently he does have his arterial intervention for the right lower extremity scheduled for this coming Monday which is the 25th. Subsequently he has a second procedure actually scheduled for 2 February which is a Tuesday of the following week. He also is scheduled to see infectious disease on January 29 which is next Friday. All this is in place and good news. With that being said he has not heard anything from [facility] at this point as far as an appointment with Dr. Amalia Hailey in order to see about evaluation with regard to the surgical intervention and debridement. Obviously I think if he has a chance that maintaining and preventing amputation it is good to be with reestablishing good blood flow, controlling the infection that is currently present, and subsequently seeing Dr. Amalia Hailey for surgical debridement of the Achilles, bursa, and calcaneus region. Obviously good wound care plays a role in this but I do not believe that alone is good to be  enough for this patient. I also think hyperbarics could play a role but the other key factors need to be in place first before we can even consider hyperbarics. Electronic Signature(s) BAIN, WHICHARD ([id number]) Signed: 03/17/2019 1:57:13 PM By: Worthy Keeler PA-C Entered By: Worthy Keeler on 03/17/2019 13:57:12 Shreffler, [name] ([id number]) -------------------------------------------------------------------------------- Physical Exam Details Patient Name: AHLIJAH, RAIA. Date of Service: 03/17/2019 1:00 PM Medical Record Number: [id number] Patient Account Number: [id number] Date of Birth/Sex: 1930/08/05 (86 y.o. M) Treating RN: Army Melia Primary Care Provider: Caryl Pina Other Clinician: Referring Provider: Caryl Pina Treating Provider/Extender: STONE III, Kenlynn Houde Weeks in Treatment: 6 Constitutional Well-nourished and well-hydrated in no acute distress. Respiratory normal breathing without difficulty. Psychiatric this patient is able to make decisions and demonstrates good insight into disease process. Alert and Oriented x 3. pleasant and cooperative. Notes Upon inspection today patient's wounds currently appear to be doing equally bad if not in some cases worse compared to previous evaluation. He also has several new areas that were established today specially on the left foot that were not previously documented. Unfortunately I feel like he is headed in the wrong direction at this point we will try to get things under control. Electronic Signature(s) Signed: 03/17/2019 1:57:36 PM By: Worthy Keeler PA-C Entered By: Worthy Keeler on 03/17/2019 13:57:35 Kainz, [name] ([id number]) -------------------------------------------------------------------------------- Physician Orders Details Patient Name: CYLUS, DOUVILLE.  Date of Service: 03/17/2019 1:00 PM Medical Record Number: [id number] Patient Account Number: [id number] Date of Birth/Sex: 08/18/1930 (86  y.o. M) Treating RN: Army Melia Primary Care Provider: Caryl Pina Other Clinician: Referring Provider: Caryl Pina Treating Provider/Extender: Melburn Hake, Kyna Blahnik Weeks in Treatment: 6 Verbal / Phone Orders: No Diagnosis Coding ICD-10 Coding Code Description L89.610 Pressure ulcer of right heel, unstageable L89.890 Pressure ulcer of other site, unstageable E11.622 Type 2 diabetes mellitus with other skin ulcer L97.522 Non-pressure chronic ulcer of other part of left foot with fat layer exposed L97.512 Non-pressure chronic ulcer of other part of right foot with fat layer exposed I73.89 Other specified peripheral vascular diseases I25.10 Atherosclerotic heart disease of native coronary artery without angina pectoris I10 Essential (primary) hypertension N39.498 Other specified urinary incontinence Wound Cleansing Wound #1 Right Calcaneus o Clean wound with Normal Saline. - in office o Cleanse wound with mild soap and water Wound #10 Right,Dorsal Foot o Clean wound with Normal Saline. - in office o Cleanse wound with mild soap and water Wound #11 Left Calcaneus o Clean wound with Normal Saline. - in office o Cleanse wound with mild soap and water Wound #12 Left Metatarsal head fifth o Clean wound with Normal Saline. - in office o Cleanse wound with mild soap and water Wound #2 Right Toe Fourth o Clean wound with Normal Saline. - in office o Cleanse wound with mild soap and water Wound #3 Right Toe Fifth o Clean wound with Normal Saline. - in office o Cleanse wound with mild soap and water Wound #4 Left,Lateral Toe Third o Clean wound with Normal Saline. - in office o Cleanse wound with mild soap and water Wound #5 Left,Medial Toe Fourth o Clean wound with Normal Saline. - in office JOSEANTONIO, DITTMAR ([id number]) o Cleanse wound with mild soap and water Wound #6 Left,Lateral Toe Fourth o Clean wound with Normal Saline. - in  office o Cleanse wound with mild soap and water Wound #7 Left,Medial Toe Fifth o Clean wound with Normal Saline. - in office o Cleanse wound with mild soap and water Wound #8 Right Achilles o Clean wound with Normal Saline. - in office o Cleanse wound with mild soap and water Wound #9 Right,Lateral Foot o Clean wound with Normal Saline. - in office o Cleanse wound with mild soap and water Primary Wound Dressing Wound #1 Right Calcaneus o Other: - betadine Wound #10 Right,Dorsal Foot o Other: - betadine Wound #11 Left Calcaneus o Other: - betadine Wound #12 Left Metatarsal head fifth o Other: - betadine Wound #2 Right Toe Fourth o Other: - betadine Wound #3 Right Toe Fifth o Other: - betadine Wound #4 Left,Lateral Toe Third o Silver Alginate Wound #5 Left,Medial Toe Fourth o Silver Alginate Wound #6 Left,Lateral Toe Fourth o Silver Alginate Wound #7 Left,Medial Toe Fifth o Silver Alginate Wound #8 Right Achilles o Iodoflex Wound #9 Right,Lateral Foot o Other: - betadine Secondary Dressing Wound #1 Right Calcaneus LEWIN, PELLOW. ([id number]) o Conform/Kerlix o Other - heel cup Wound #10 Right,Dorsal Foot o Conform/Kerlix o Other - heel cup Wound #11 Left Calcaneus o Conform/Kerlix o Other - heel cup Wound #12 Left Metatarsal head fifth o Conform/Kerlix o Other - heel cup Wound #2 Right Toe Fourth o Conform/Kerlix o Other - heel cup Wound #3 Right Toe Fifth o Conform/Kerlix o Other - heel cup Wound #4 Left,Lateral Toe Third o Conform/Kerlix o Other - heel cup Wound #5 Left,Medial Toe Fourth o Conform/Kerlix o Other -  heel cup Wound #6 Left,Lateral Toe Fourth o Conform/Kerlix o Other - heel cup Wound #7 Left,Medial Toe Fifth o Conform/Kerlix o Other - heel cup Wound #8 Right Achilles o Conform/Kerlix o Other - heel cup Wound #9 Right,Lateral Foot o  Conform/Kerlix o Other - heel cup Dressing Change Frequency Wound #1 Right Calcaneus o Change Dressing Tuesday and Thursday. - Tuesday by [facility] Wound #10 Right,Dorsal Foot o Change Dressing Tuesday and Thursday. - Tuesday by [facility] Wound #11 Left Calcaneus o Change Dressing Tuesday and Thursday. - Tuesday by [facility] [place], [name] ([id number]) Wound #12 Left Metatarsal head fifth o Change Dressing Tuesday and Thursday. - Tuesday by [facility] Wound #2 Right Toe Fourth o Change Dressing Tuesday and Thursday. - Tuesday by [facility] Wound #3 Right Toe Fifth o Change Dressing Tuesday and Thursday. - Tuesday by [facility] Wound #4 Left,Lateral Toe Third o Change Dressing Tuesday and Thursday. - Tuesday by [facility] Wound #5 Left,Medial Toe Fourth o Change Dressing Tuesday and Thursday. - Tuesday by [facility] Wound #6 Left,Lateral Toe Fourth o Change Dressing Tuesday and Thursday. - Tuesday by [facility] Wound #7 Left,Medial Toe Fifth o Change Dressing Tuesday and Thursday. - Tuesday by [facility] Wound #8 Right Achilles o Change Dressing Tuesday and Thursday. - Tuesday by [facility] Wound #9 Right,Lateral Foot o Change Dressing Tuesday and Thursday. - Tuesday by [facility] Follow-up Appointments Wound #1 Right Calcaneus o Return Appointment in 1 week. Wound #10 Right,Dorsal Foot o Return Appointment in 1 week. Wound #11 Left Calcaneus o Return Appointment in 1 week. Wound #12 Left Metatarsal head fifth o Return Appointment in 1 week. Wound #2 Right Toe Fourth o Return Appointment in 1 week. Wound #3 Right Toe Fifth o Return Appointment in 1 week. Wound #4 Left,Lateral Toe Third o Return Appointment in 1 week. Wound #5 Left,Medial Toe Fourth o Return Appointment in 1 week. Wound #6 Left,Lateral Toe Fourth o Return Appointment in 1 week. Wound #7 Left,Medial Toe Fifth o Return Appointment in 1 week. JASIRI, HANAWALT ([id number]) Wound #8 Right Achilles o Return Appointment in 1 week. Wound #9  Right,Lateral Foot o Return Appointment in 1 week. Home Health Wound #1 Right [place] Visits - [place] Nurse may visit PRN to address patientos wound care needs. o FACE TO FACE ENCOUNTER: MEDICARE and MEDICAID PATIENTS: I certify that this patient is under my care and that I had a face-to-face encounter that meets the physician face-to-face encounter requirements with this patient on this date. The encounter with the patient was in whole or in part for the following MEDICAL CONDITION: (primary reason for [place]) MEDICAL NECESSITY: I certify, that based on my findings, NURSING services are a medically necessary home health service. HOME BOUND STATUS: I certify that my clinical findings support that this patient is homebound (i.e., Due to illness or injury, pt requires aid of supportive devices such as crutches, cane, wheelchairs, walkers, the use of special transportation or the assistance of another person to leave their place of residence. There is a normal inability to leave the home and doing so requires considerable and taxing effort. Other absences are for medical reasons / religious services and are infrequent or of short duration when for other reasons). o If current dressing causes regression in wound condition, may D/C ordered dressing product/s and apply Normal Saline Moist Dressing daily until next [place] / Other MD appointment. [place] of regression in wound condition at [phone]. o Please  direct any NON-WOUND related issues/requests for orders to patient's Primary Care Physician Wound #10 [place] Visits - [place] Nurse may visit PRN to address patientos wound care needs. o FACE TO FACE ENCOUNTER: MEDICARE and MEDICAID PATIENTS: I certify that this patient is under my care and that I had a face-to-face encounter that meets the  physician face-to-face encounter requirements with this patient on this date. The encounter with the patient was in whole or in part for the following MEDICAL CONDITION: (primary reason for [place]) MEDICAL NECESSITY: I certify, that based on my findings, NURSING services are a medically necessary home health service. HOME BOUND STATUS: I certify that my clinical findings support that this patient is homebound (i.e., Due to illness or injury, pt requires aid of supportive devices such as crutches, cane, wheelchairs, walkers, the use of special transportation or the assistance of another person to leave their place of residence. There is a normal inability to leave the home and doing so requires considerable and taxing effort. Other absences are for medical reasons / religious services and are infrequent or of short duration when for other reasons). o If current dressing causes regression in wound condition, may D/C ordered dressing product/s and apply Normal Saline Moist Dressing daily until next [place] / Other MD appointment. [place] of regression in wound condition at [phone]. o Please direct any NON-WOUND related issues/requests for orders to patient's Primary Care Physician Wound #11 Left Calcaneus o [place] Visits - [place] Nurse may visit PRN to address patientos wound care needs. o FACE TO FACE ENCOUNTER: MEDICARE and MEDICAID PATIENTS: I certify that this patient is under my care and that I had a face-to-face encounter that meets the physician face-to-face encounter requirements with this patient on this date. The encounter with the patient was in whole or in part for the following MEDICAL CONDITION: (primary reason for [place]) MEDICAL NECESSITY: I certify, that based on my findings, NURSING services are a medically necessary home health service. HOME BOUND STATUS: I certify that my clinical  findings support that this patient is homebound (i.e., Due to illness or injury, pt requires aid of supportive devices such as crutches, cane, wheelchairs, walkers, the use of special transportation or the assistance of another person to leave their place of residence. There is a normal inability to leave the home and doing so requires considerable and taxing effort. Other absences are for medical reasons / religious services and are infrequent or of short duration when for other reasons). ULUS, HAZEN ([id number]) o If current dressing causes regression in wound condition, may D/C ordered dressing product/s and apply Normal Saline Moist Dressing daily until next [place] / Other MD appointment. [place] of regression in wound condition at [phone]. o Please direct any NON-WOUND related issues/requests for orders to patient's Primary Care Physician Wound #12 Left Metatarsal head fifth o [place] Visits - [place] Nurse may visit PRN to address patientos wound care needs. o FACE TO FACE ENCOUNTER: MEDICARE and MEDICAID PATIENTS: I certify that this patient is under my care and that I had a face-to-face encounter that meets the physician face-to-face encounter requirements with this patient on this date. The encounter with the patient was in whole or in part for the following MEDICAL CONDITION: (primary reason for [place]) MEDICAL NECESSITY: I certify, that based on my findings, NURSING services  are a medically necessary home health service. HOME BOUND STATUS: I certify that my clinical findings support that this patient is homebound (i.e., Due to illness or injury, pt requires aid of supportive devices such as crutches, cane, wheelchairs, walkers, the use of special transportation or the assistance of another person to leave their place of residence. There is a normal inability to leave the home and doing so  requires considerable and taxing effort. Other absences are for medical reasons / religious services and are infrequent or of short duration when for other reasons). o If current dressing causes regression in wound condition, may D/C ordered dressing product/s and apply Normal Saline Moist Dressing daily until next [place] / Other MD appointment. [place] of regression in wound condition at [phone]. o Please direct any NON-WOUND related issues/requests for orders to patient's Primary Care Physician Wound #2 Right Toe [place] Visits - [place] Nurse may visit PRN to address patientos wound care needs. o FACE TO FACE ENCOUNTER: MEDICARE and MEDICAID PATIENTS: I certify that this patient is under my care and that I had a face-to-face encounter that meets the physician face-to-face encounter requirements with this patient on this date. The encounter with the patient was in whole or in part for the following MEDICAL CONDITION: (primary reason for [place]) MEDICAL NECESSITY: I certify, that based on my findings, NURSING services are a medically necessary home health service. HOME BOUND STATUS: I certify that my clinical findings support that this patient is homebound (i.e., Due to illness or injury, pt requires aid of supportive devices such as crutches, cane, wheelchairs, walkers, the use of special transportation or the assistance of another person to leave their place of residence. There is a normal inability to leave the home and doing so requires considerable and taxing effort. Other absences are for medical reasons / religious services and are infrequent or of short duration when for other reasons). o If current dressing causes regression in wound condition, may D/C ordered dressing product/s and apply Normal Saline Moist Dressing daily until next [place] / Other MD appointment. [place] of regression in wound condition at [phone]. o Please direct any NON-WOUND related issues/requests for orders to patient's Primary Care Physician Wound #3 Right Toe [place] Visits - [place] Nurse may visit PRN to address patientos wound care needs. o FACE TO FACE ENCOUNTER: MEDICARE and MEDICAID PATIENTS: I certify that this patient is under my care and that I had a face-to-face encounter that meets the physician face-to-face encounter requirements with this patient on this date. The encounter with the patient was in whole or in part for the following MEDICAL CONDITION: (primary reason for [place]) MEDICAL NECESSITY: I certify, that based on my findings, NURSING services are a medically necessary home health service. HOME BOUND STATUS: I certify that my clinical findings support that this patient is homebound (i.e., Due to illness or injury, pt requires aid of supportive devices such as crutches, cane, wheelchairs, walkers, the use of special transportation or the assistance of another person to leave their place of residence. There is a normal inability to leave the home and doing so requires considerable and taxing effort. Other absences are for medical reasons / religious services and are infrequent or of short duration when for other reasons). o If current dressing causes regression in wound condition, may D/C ordered dressing product/s and apply  Normal Saline Moist Dressing daily until next [place] / Other MD appointment. [place] of regression in wound condition at [phone]. o Please direct any NON-WOUND related issues/requests for orders to patient's Primary Care Physician OLEG, OLESON ([id number]) Wound #4 Left,Lateral Toe [place] Visits - [place] Nurse may visit PRN to address patientos wound care needs. o FACE TO FACE  ENCOUNTER: MEDICARE and MEDICAID PATIENTS: I certify that this patient is under my care and that I had a face-to-face encounter that meets the physician face-to-face encounter requirements with this patient on this date. The encounter with the patient was in whole or in part for the following MEDICAL CONDITION: (primary reason for [place]) MEDICAL NECESSITY: I certify, that based on my findings, NURSING services are a medically necessary home health service. HOME BOUND STATUS: I certify that my clinical findings support that this patient is homebound (i.e., Due to illness or injury, pt requires aid of supportive devices such as crutches, cane, wheelchairs, walkers, the use of special transportation or the assistance of another person to leave their place of residence. There is a normal inability to leave the home and doing so requires considerable and taxing effort. Other absences are for medical reasons / religious services and are infrequent or of short duration when for other reasons). o If current dressing causes regression in wound condition, may D/C ordered dressing product/s and apply Normal Saline Moist Dressing daily until next [place] / Other MD appointment. [place] of regression in wound condition at [phone]. o Please direct any NON-WOUND related issues/requests for orders to patient's Primary Care Physician Wound #5 Left,Medial Toe [place] Visits - [place] Nurse may visit PRN to address patientos wound care needs. o FACE TO FACE ENCOUNTER: MEDICARE and MEDICAID PATIENTS: I certify that this patient is under my care and that I had a face-to-face encounter that meets the physician face-to-face encounter requirements with this patient on this date. The encounter with the patient was in whole or in part for the following MEDICAL CONDITION: (primary reason for [place]) MEDICAL NECESSITY: I  certify, that based on my findings, NURSING services are a medically necessary home health service. HOME BOUND STATUS: I certify that my clinical findings support that this patient is homebound (i.e., Due to illness or injury, pt requires aid of supportive devices such as crutches, cane, wheelchairs, walkers, the use of special transportation or the assistance of another person to leave their place of residence. There is a normal inability to leave the home and doing so requires considerable and taxing effort. Other absences are for medical reasons / religious services and are infrequent or of short duration when for other reasons). o If current dressing causes regression in wound condition, may D/C ordered dressing product/s and apply Normal Saline Moist Dressing daily until next [place] / Other MD appointment. [place] of regression in wound condition at [phone]. o Please direct any NON-WOUND related issues/requests for orders to patient's Primary Care Physician Wound #6 Left,Lateral Toe [place] Visits - [place] Nurse may visit PRN to address patientos wound care needs. o FACE TO FACE ENCOUNTER: MEDICARE and MEDICAID PATIENTS: I certify that this patient is under my care and that I had a face-to-face encounter that meets the physician face-to-face encounter requirements with this patient on this date. The encounter with the  patient was in whole or in part for the following MEDICAL CONDITION: (primary reason for [place]) MEDICAL NECESSITY: I certify, that based on my findings, NURSING services are a medically necessary home health service. HOME BOUND STATUS: I certify that my clinical findings support that this patient is homebound (i.e., Due to illness or injury, pt requires aid of supportive devices such as crutches, cane, wheelchairs, walkers, the use of special transportation or the assistance of  another person to leave their place of residence. There is a normal inability to leave the home and doing so requires considerable and taxing effort. Other absences are for medical reasons / religious services and are infrequent or of short duration when for other reasons). o If current dressing causes regression in wound condition, may D/C ordered dressing product/s and apply Normal Saline Moist Dressing daily until next [place] / Other MD appointment. [place] of regression in wound condition at [phone]. o Please direct any NON-WOUND related issues/requests for orders to patient's Primary Care Physician Wound #7 Left,Medial Toe [place] Visits - [place] Nurse may visit PRN to address patientos wound care needs. REYAAN, THOMA ([id number]) o FACE TO FACE ENCOUNTER: MEDICARE and MEDICAID PATIENTS: I certify that this patient is under my care and that I had a face-to-face encounter that meets the physician face-to-face encounter requirements with this patient on this date. The encounter with the patient was in whole or in part for the following MEDICAL CONDITION: (primary reason for [place]) MEDICAL NECESSITY: I certify, that based on my findings, NURSING services are a medically necessary home health service. HOME BOUND STATUS: I certify that my clinical findings support that this patient is homebound (i.e., Due to illness or injury, pt requires aid of supportive devices such as crutches, cane, wheelchairs, walkers, the use of special transportation or the assistance of another person to leave their place of residence. There is a normal inability to leave the home and doing so requires considerable and taxing effort. Other absences are for medical reasons / religious services and are infrequent or of short duration when for other reasons). o If current dressing causes regression in wound condition, may  D/C ordered dressing product/s and apply Normal Saline Moist Dressing daily until next [place] / Other MD appointment. [place] of regression in wound condition at [phone]. o Please direct any NON-WOUND related issues/requests for orders to patient's Primary Care Physician Wound #8 Right Achilles o [place] Visits - [place] Nurse may visit PRN to address patientos wound care needs. o FACE TO FACE ENCOUNTER: MEDICARE and MEDICAID PATIENTS: I certify that this patient is under my care and that I had a face-to-face encounter that meets the physician face-to-face encounter requirements with this patient on this date. The encounter with the patient was in whole or in part for the following MEDICAL CONDITION: (primary reason for [place]) MEDICAL NECESSITY: I certify, that based on my findings, NURSING services are a medically necessary home health service. HOME BOUND STATUS: I certify that my clinical findings support that this patient is homebound (i.e., Due to illness or injury, pt requires aid of supportive devices such as crutches, cane, wheelchairs, walkers, the use of special transportation or the assistance of another person to leave their place of residence. There is a normal inability to leave the home and doing so requires considerable and taxing effort. Other absences are for medical  reasons / religious services and are infrequent or of short duration when for other reasons). o If current dressing causes regression in wound condition, may D/C ordered dressing product/s and apply Normal Saline Moist Dressing daily until next [place] / Other MD appointment. [place] of regression in wound condition at [phone]. o Please direct any NON-WOUND related issues/requests for orders to patient's Primary Care Physician Wound #9 [place] Visits -  [place] Nurse may visit PRN to address patientos wound care needs. o FACE TO FACE ENCOUNTER: MEDICARE and MEDICAID PATIENTS: I certify that this patient is under my care and that I had a face-to-face encounter that meets the physician face-to-face encounter requirements with this patient on this date. The encounter with the patient was in whole or in part for the following MEDICAL CONDITION: (primary reason for [place]) MEDICAL NECESSITY: I certify, that based on my findings, NURSING services are a medically necessary home health service. HOME BOUND STATUS: I certify that my clinical findings support that this patient is homebound (i.e., Due to illness or injury, pt requires aid of supportive devices such as crutches, cane, wheelchairs, walkers, the use of special transportation or the assistance of another person to leave their place of residence. There is a normal inability to leave the home and doing so requires considerable and taxing effort. Other absences are for medical reasons / religious services and are infrequent or of short duration when for other reasons). o If current dressing causes regression in wound condition, may D/C ordered dressing product/s and apply Normal Saline Moist Dressing daily until next [place] / Other MD appointment. [place] of regression in wound condition at [phone]. o Please direct any NON-WOUND related issues/requests for orders to patient's Primary Care Physician Patient Medications Allergies: Sulfa (Sulfonamide Antibiotics), aspirin Notifications Medication Indication Start End cefdinir 03/17/2019 DOSE 1 - oral 300 mg capsule - 1 capsule oral taken 2 times a day for 7 days until follow-up with Infectious disease KEEVON, HENNEY ([id number]) Electronic Signature(s) Signed: 03/17/2019 1:54:29 PM By: Worthy Keeler PA-C Entered By: Worthy Keeler on 03/17/2019 13:54:26 Brimage, [name]  ([id number]) -------------------------------------------------------------------------------- Problem List Details Patient Name: JAXZEN, VANHORN. Date of Service: 03/17/2019 1:00 PM Medical Record Number: [id number] Patient Account Number: [id number] Date of Birth/Sex: 1930/12/29 (86 y.o. M) Treating RN: Army Melia Primary Care Provider: Caryl Pina Other Clinician: Referring Provider: Caryl Pina Treating Provider/Extender: Melburn Hake, Ahlani Wickes Weeks in Treatment: 6 Active Problems ICD-10 Evaluated Encounter Code Description Active Date Today Diagnosis L89.610 Pressure ulcer of right heel, unstageable 02/03/2019 No Yes L89.890 Pressure ulcer of other site, unstageable 02/03/2019 No Yes E11.622 Type 2 diabetes mellitus with other skin ulcer 03/03/2019 No Yes L97.522 Non-pressure chronic ulcer of other part of left foot with fat 03/03/2019 No Yes layer exposed L97.512 Non-pressure chronic ulcer of other part of right foot with fat 03/03/2019 No Yes layer exposed I73.89 Other specified peripheral vascular diseases 03/03/2019 No Yes I25.10 Atherosclerotic heart disease of native coronary artery 02/03/2019 No Yes without angina pectoris I10 Essential (primary) hypertension 02/03/2019 No Yes N39.498 Other specified urinary incontinence 02/03/2019 No Yes Inactive Problems MIHIR, FLANIGAN ([id number]) Resolved Problems Electronic Signature(s) Signed: 03/17/2019 12:51:57 PM By: Worthy Keeler PA-C Entered By: Worthy Keeler on 03/17/2019 12:51:56 Wehmeyer, [name] ([id number]) -------------------------------------------------------------------------------- Progress Note Details Patient Name: Jacqualyn Posey. Date of Service: 03/17/2019 1:00 PM Medical Record Number: [id number] Patient  Account Number: [id number] Date of Birth/Sex: [birth year] (86 y.o. M) Treating RN: Army Melia Primary Care Provider: Caryl Pina Other Clinician: Referring Provider: Caryl Pina Treating  Provider/Extender: Melburn Hake, Karole Oo Weeks in Treatment: 6 Subjective Chief Complaint Information obtained from Patient Heel pressure ulcer and right 4th and 5th toe pressure ulcers along with multiple left toe ulcers History of Present Illness (HPI) 02/03/2019 patient presents today for initial evaluation in our clinic concerning issues that he has been having with wounds on his right heel as well as the right fourth and fifth toes. The toe ulcers have been present for about 5 months. They are pretty much just dry and eschar covered at this point but appear to be stable. The heel ulcer has been present for about 2 months and is very soft the eschar is loosening and there appears to be purulent drainage as well noted at this point there is also erythema surrounding the wound bed. I am unsure if this is just inflammation or secondary to infection to be perfectly honest. With that being said the patient has recently gotten Prevalon offloading boots and he is essentially nonambulatory at this point. He did have a coronary artery bypass graft x4 in 1989. Otherwise he tells me that he also has hypertension and urinary incontinence. Obviously that is not playing any role in his wounds currently based on what we are seeing today. 02/10/2019 on evaluation today patient appears to be doing quite well with regard to his wounds. He is not showing a lot of signs of improvement yet but he still has a lot of eschar at this point. He still awaiting the vascular evaluation at this time as well. Fortunately there is no signs of active infection. No fevers, chills, nausea, vomiting, or diarrhea. 02/17/2019 on evaluation today patient appears to be doing quite well with regard to his wound. This seems to be greatly improved compared to last visit I do believe that the cefdinir is doing well for him. Fortunately there is no signs of active infection at this time. No fever chills noted no fevers, chills, nausea,  vomiting, or diarrhea. 03/03/2019 on evaluation today patient appears to be doing unfortunately worse even compared to his last visit. His wounds have not progressed in a good way and unfortunately he has multiple new locations on the left foot as well that are giving him trouble. I am obviously concerned about how things are progressing at this point. I did actually have results to review for him today. This included his arterial study which revealed that his ABIs were not obtainable and subsequently his TBI's were very low in the 0.35-0.30 range bilaterally. Subsequently his x-ray also revealed that there are potential changes on the x-ray at the heel on the right that could be associated with osteomyelitis. Nonetheless I do believe that the patient does get a likely require a vascular consult as well as an MRI to further evaluate the situation. In the meantime I am also going to recommend refilling the Omnicef to continue treatment as such since that does seem to have been at least somewhat beneficial for him. 03/10/2019 upon evaluation today patient appears to be doing unfortunately a little bit worse in regard to new areas of what appear to be necrotic tissue showing up at multiple places on his lower extremity. I am very concerned about this to be honest. In fact he has an appointment next Thursday with vascular for evaluation. I really want to see if we try to get  this moved up sooner I discussed this with the patient he is in agreement with that. He is also going for his MRI today once he leaves the clinic here. Nonetheless I am concerned about his arterial status based on what we are seeing currently. 03/17/2019 on evaluation today patient appears to be doing poorly at this point in regard to his bilateral lower extremities. Unfortunately he is having new wounds show up each time I see him we have been seeing him on a weekly schedule. Subsequently he does have his arterial intervention for the  right lower extremity scheduled for this coming Monday which is the 25th. Subsequently he has a second procedure actually scheduled for 2 February which is a Tuesday of the following week. He also is scheduled to see infectious disease on January 29 which is next Friday. All this is in place and good news. With that being said he has not heard anything from [facility] at this point as far as an appointment with Dr. Amalia Hailey in order to see about evaluation with regard to the surgical intervention and debridement. Obviously I think if he has a chance that maintaining and preventing amputation it is good to be with reestablishing good blood flow, controlling the infection that is currently present, and subsequently seeing Dr. Amalia Hailey for surgical debridement of the Achilles, bursa, and calcaneus region. EDOARDO, LAFORTE ([id number]) Obviously good wound care plays a role in this but I do not believe that alone is good to be enough for this patient. I also think hyperbarics could play a role but the other key factors need to be in place first before we can even consider hyperbarics. Objective Constitutional Well-nourished and well-hydrated in no acute distress. Vitals Time Taken: 1:20 PM, Height: 68 in, Weight: 235 lbs, BMI: 35.7, Temperature: 97.7 F, Pulse: 86 bpm, Respiratory Rate: 16 breaths/min, Blood Pressure: 140/74 mmHg. Respiratory normal breathing without difficulty. Psychiatric this patient is able to make decisions and demonstrates good insight into disease process. Alert and Oriented x 3. pleasant and cooperative. General Notes: Upon inspection today patient's wounds currently appear to be doing equally bad if not in some cases worse compared to previous evaluation. He also has several new areas that were established today specially on the left foot that were not previously documented. Unfortunately I feel like he is headed in the wrong direction at this point we will try to get things  under control. Integumentary (Hair, Skin) Wound #1 status is Open. Original cause of wound was Gradually Appeared. The wound is located on the Right Calcaneus. The wound measures 4.5cm length x 5cm width x 0.1cm depth; 17.671cm^2 area and 1.767cm^3 volume. Wound #10 status is Open. Original cause of wound was Gradually Appeared. The wound is located on the Right,Dorsal Foot. The wound measures 1.9cm length x 1cm width x 0.1cm depth; 1.492cm^2 area and 0.149cm^3 volume. There is no tunneling or undermining noted. There is a none present amount of drainage noted. The wound margin is distinct with the outline attached to the wound base. There is no granulation within the wound bed. There is a large (67-100%) amount of necrotic tissue within the wound bed including Eschar. Wound #11 status is Open. Original cause of wound was Gradually Appeared. The wound is located on the Left Calcaneus. The wound measures 3cm length x 2cm width x 0.1cm depth; 4.712cm^2 area and 0.471cm^3 volume. There is a none present amount of drainage noted. The wound margin is fibrotic, thickened scar. There is no granulation  within the wound bed. There is a large (67-100%) amount of necrotic tissue within the wound bed including Eschar. Wound #12 status is Open. Original cause of wound was Gradually Appeared. The wound is located on the Left Metatarsal head fifth. The wound measures 1cm length x 0.6cm width x 0.1cm depth; 0.471cm^2 area and 0.047cm^3 volume. There is a none present amount of drainage noted. The wound margin is flat and intact. There is no granulation within the wound bed. There is a large (67-100%) amount of necrotic tissue within the wound bed including Eschar. Wound #2 status is Open. Original cause of wound was Gradually Appeared. The wound is located on the Right Toe Fourth. The wound measures 0.2cm length x 0.4cm width x 0.2cm depth; 0.063cm^2 area and 0.013cm^3 volume. Wound #3 status is Open. Original  cause of wound was Pressure Injury. The wound is located on the Right Toe Fifth. The wound measures 0.8cm length x 1cm width x 0.1cm depth; 0.628cm^2 area and 0.063cm^3 volume. MAHESH, SIZEMORE ([id number]) Wound #4 status is Open. Original cause of wound was Gradually Appeared. The wound is located on the Left,Lateral Toe Third. The wound measures 0.3cm length x 0.4cm width x 0.4cm depth; 0.094cm^2 area and 0.038cm^3 volume. Wound #5 status is Open. Original cause of wound was Gradually Appeared. The wound is located on the Left,Medial Toe Fourth. The wound measures 0.5cm length x 0.6cm width x 0.3cm depth; 0.236cm^2 area and 0.071cm^3 volume. Wound #6 status is Open. Original cause of wound was Gradually Appeared. The wound is located on the Left,Lateral Toe Fourth. The wound measures 0.4cm length x 0.5cm width x 0.3cm depth; 0.157cm^2 area and 0.047cm^3 volume. Wound #7 status is Open. Original cause of wound was Gradually Appeared. The wound is located on the Left,Medial Toe Fifth. The wound measures 0.9cm length x 2.5cm width x 0.1cm depth; 1.767cm^2 area and 0.177cm^3 volume. Wound #8 status is Open. Original cause of wound was Gradually Appeared. The wound is located on the Right Achilles. The wound measures 10cm length x 4.5cm width x 0.7cm depth; 35.343cm^2 area and 24.74cm^3 volume. There is Fat Layer (Subcutaneous Tissue) Exposed exposed. There is no tunneling or undermining noted. There is a medium amount of serous drainage noted. The wound margin is thickened. There is no granulation within the wound bed. There is a large (67-100%) amount of necrotic tissue within the wound bed including Eschar and Adherent Slough. Wound #9 status is Open. Original cause of wound was Gradually Appeared. The wound is located on the Right,Lateral Foot. The wound measures 1.7cm length x 1.5cm width x 0.1cm depth; 2.003cm^2 area and 0.2cm^3 volume. There is no tunneling or undermining noted. There is a  none present amount of drainage noted. The wound margin is flat and intact. There is no granulation within the wound bed. There is a large (67-100%) amount of necrotic tissue within the wound bed including Eschar. 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 - in office Cleanse wound with mild soap and water Wound #10 Right,Dorsal Foot: Clean wound with Normal Saline. -  in office Cleanse wound with mild soap and water Wound #11 Left Calcaneus: Clean wound with Normal Saline. - in office LANCELOT, ALYEA ([id number]) Cleanse wound with mild soap and water Wound #12 Left Metatarsal head fifth: Clean wound with Normal Saline. - in office Cleanse wound with mild soap and water Wound #2 Right Toe Fourth: Clean wound with Normal Saline. - in office Cleanse wound with mild soap and water Wound #3 Right Toe Fifth: Clean wound with Normal Saline. - in office Cleanse wound with mild soap and water Wound #4 Left,Lateral Toe Third: Clean wound with Normal Saline. - in office Cleanse wound with mild soap and water Wound #5 Left,Medial Toe Fourth: Clean wound with Normal Saline. - in office Cleanse wound with mild soap and water Wound #6 Left,Lateral Toe Fourth: Clean wound with Normal Saline. - in office Cleanse wound with mild soap and water Wound #7 Left,Medial Toe Fifth: Clean wound with Normal Saline. - in office Cleanse wound with mild  soap and water Wound #8 Right Achilles: Clean wound with Normal Saline. - in office Cleanse wound with mild soap and water Wound #9 Right,Lateral Foot: Clean wound with Normal Saline. - in office Cleanse wound with mild soap and water Primary Wound Dressing: Wound #1 Right Calcaneus: Other: - betadine Wound #10 Right,Dorsal Foot: Other: - betadine Wound #11 Left Calcaneus: Other: - betadine Wound #12 Left Metatarsal head fifth: Other: - betadine Wound #2 Right Toe Fourth: Other: - betadine Wound #3 Right Toe Fifth: Other: - betadine Wound #4 Left,Lateral Toe Third: Silver Alginate Wound #5 Left,Medial Toe Fourth: Silver Alginate Wound #6 Left,Lateral Toe Fourth: Silver Alginate Wound #7 Left,Medial Toe Fifth: Silver Alginate Wound #8 Right Achilles: Iodoflex Wound #9 Right,Lateral Foot: Other: - betadine Secondary Dressing: Wound #1 Right Calcaneus: Conform/Kerlix Other - heel cup Wound #10 Right,Dorsal Foot: Conform/Kerlix Other - heel cup DAELIN, HASTE ([id number]) Wound #11 Left Calcaneus: Conform/Kerlix Other - heel cup Wound #12 Left Metatarsal head fifth: Conform/Kerlix Other - heel cup Wound #2 Right Toe Fourth: Conform/Kerlix Other - heel cup Wound #3 Right Toe Fifth: Conform/Kerlix Other - heel cup Wound #4 Left,Lateral Toe Third: Conform/Kerlix Other - heel cup Wound #5 Left,Medial Toe Fourth: Conform/Kerlix Other - heel cup Wound #6 Left,Lateral Toe Fourth: Conform/Kerlix Other - heel cup Wound #7 Left,Medial Toe Fifth: Conform/Kerlix Other - heel cup Wound #8 Right Achilles: Conform/Kerlix Other - heel cup Wound #9 Right,Lateral Foot: Conform/Kerlix Other - heel cup Dressing Change Frequency: Wound #1 Right Calcaneus: Change Dressing Tuesday and Thursday. - Tuesday by [facility] Wound #10 Right,Dorsal Foot: Change Dressing Tuesday and Thursday. - Tuesday by [facility] Wound #11 Left Calcaneus: Change Dressing Tuesday and Thursday. - Tuesday  by [facility] Wound #12 Left Metatarsal head fifth: Change Dressing Tuesday and Thursday. - Tuesday by [facility] Wound #2 Right Toe Fourth: Change Dressing Tuesday and Thursday. - Tuesday by [facility] Wound #3 Right Toe Fifth: Change Dressing Tuesday and Thursday. - Tuesday by [facility] Wound #4 Left,Lateral Toe Third: Change Dressing Tuesday and Thursday. - Tuesday by [facility] Wound #5 Left,Medial Toe Fourth: Change Dressing Tuesday and Thursday. - Tuesday by [facility] Wound #6 Left,Lateral Toe Fourth: Change Dressing Tuesday and Thursday. - Tuesday by [facility] Wound #7 Left,Medial Toe Fifth: Change Dressing Tuesday and Thursday. - Tuesday by [facility] Wound #8 Right Achilles: Change Dressing Tuesday and Thursday. - Tuesday by [facility] Wound #9 Right,Lateral Foot: Change Dressing Tuesday and Thursday. - Tuesday by [facility] Follow-up Appointments: Wound #1 Right Calcaneus: Return Appointment in  1 week. Wound #10 Right,Dorsal Foot: Return Appointment in 1 week. RONIEL, HALLORAN ([id number]) Wound #11 Left Calcaneus: Return Appointment in 1 week. Wound #12 Left Metatarsal head fifth: Return Appointment in 1 week. Wound #2 Right Toe Fourth: Return Appointment in 1 week. Wound #3 Right Toe Fifth: Return Appointment in 1 week. Wound #4 Left,Lateral Toe Third: Return Appointment in 1 week. Wound #5 Left,Medial Toe Fourth: Return Appointment in 1 week. Wound #6 Left,Lateral Toe Fourth: Return Appointment in 1 week. Wound #7 Left,Medial Toe Fifth: Return Appointment in 1 week. Wound #8 Right Achilles: Return Appointment in 1 week. Wound #9 Right,Lateral Foot: Return Appointment in 1 week. Home Health: Wound #1 Right Calcaneus: Continue Home Health Visits - [place] Nurse may visit PRN to address patient s wound care needs. FACE TO FACE ENCOUNTER: MEDICARE and MEDICAID PATIENTS: I certify that this patient is under my care and that I had a face-to-face encounter that meets the physician face-to-face encounter requirements with  this patient on this date. The encounter with the patient was in whole or in part for the following MEDICAL CONDITION: (primary reason for [place]) MEDICAL NECESSITY: I certify, that based on my findings, NURSING services are a medically necessary home health service. HOME BOUND STATUS: I certify that my clinical findings support that this patient is homebound (i.e., Due to illness or injury, pt requires aid of supportive devices such as crutches, cane, wheelchairs, walkers, the use of special transportation or the assistance of another person to leave their place of residence. There is a normal inability to leave the home and doing so requires considerable and taxing effort. Other absences are for medical reasons / religious services and are infrequent or of short duration when for other reasons). If current dressing causes regression in wound condition, may D/C ordered dressing product/s and apply Normal Saline Moist Dressing daily until next [place] / Other MD appointment. [place] of regression in wound condition at [phone]. Please direct any NON-WOUND related issues/requests for orders to patient's Primary Care Physician Wound #10 Right,Dorsal Foot: [place] Visits - [place] Nurse may visit PRN to address patient s wound care needs. FACE TO FACE ENCOUNTER: MEDICARE and MEDICAID PATIENTS: I certify that this patient is under my care and that I had a face-to-face encounter that meets the physician face-to-face encounter requirements with this patient on this date. The encounter with the patient was in whole or in part for the following MEDICAL CONDITION: (primary reason for [place]) MEDICAL NECESSITY: I certify, that based on my findings, NURSING services are a medically necessary home health service. HOME BOUND STATUS: I certify that my clinical findings support that this patient is homebound (i.e., Due to illness  or injury, pt requires aid of supportive devices such as crutches, cane, wheelchairs, walkers, the use of special transportation or the assistance of another person to leave their place of residence. There is a normal inability to leave the home and doing so requires considerable and taxing effort. Other absences are for medical reasons / religious services and are infrequent or of short duration when for other reasons). If current dressing causes regression in wound condition, may D/C ordered dressing product/s and apply Normal Saline Moist Dressing daily until next [place] / Other MD appointment. [place] of regression in wound condition at [phone]. Please direct any NON-WOUND related issues/requests for orders to patient's Primary Care Physician Wound #11 Left Calcaneus: Continue  Home Health Visits - [place] Nurse may visit PRN to address patient s wound care needs. FACE TO FACE ENCOUNTER: MEDICARE and MEDICAID PATIENTS: I certify that this patient is under my care and that I had a face-to-face encounter that meets the physician face-to-face encounter requirements with this patient on this date. The encounter with the patient was in whole or in part for the following MEDICAL CONDITION: (primary reason for [place]) MEDICAL NECESSITY: I certify, that based on my findings, NURSING services are a medically necessary home HERBERTH, DEHARO ([id number]) health service. HOME BOUND STATUS: I certify that my clinical findings support that this patient is homebound (i.e., Due to illness or injury, pt requires aid of supportive devices such as crutches, cane, wheelchairs, walkers, the use of special transportation or the assistance of another person to leave their place of residence. There is a normal inability to leave the home and doing so requires considerable and taxing effort. Other absences are for medical reasons / religious services and are  infrequent or of short duration when for other reasons). If current dressing causes regression in wound condition, may D/C ordered dressing product/s and apply Normal Saline Moist Dressing daily until next [place] / Other MD appointment. [place] of regression in wound condition at [phone]. Please direct any NON-WOUND related issues/requests for orders to patient's Primary Care Physician Wound #12 Left Metatarsal head fifth: [place] Nurse may visit PRN to address patient s wound care needs. FACE TO FACE ENCOUNTER: MEDICARE and MEDICAID PATIENTS: I certify that this patient is under my care and that I had a face-to-face encounter that meets the physician face-to-face encounter requirements with this patient on this date. The encounter with the patient was in whole or in part for the following MEDICAL CONDITION: (primary reason for [place]) MEDICAL NECESSITY: I certify, that based on my findings, NURSING services are a medically necessary home health service. HOME BOUND STATUS: I certify that my clinical findings support that this patient is homebound (i.e., Due to illness or injury, pt requires aid of supportive devices such as crutches, cane, wheelchairs, walkers, the use of special transportation or the assistance of another person to leave their place of residence. There is a normal inability to leave the home and doing so requires considerable and taxing effort. Other absences are for medical reasons / religious services and are infrequent or of short duration when for other reasons). If current dressing causes regression in wound condition, may D/C ordered dressing product/s and apply Normal Saline Moist Dressing daily until next [place] / Other MD appointment. [place] of regression in wound condition at [phone]. Please direct any NON-WOUND related issues/requests for  orders to patient's Primary Care Physician Wound #2 Right Toe Fourth: [place] Visits - [place] Nurse may visit PRN to address patient s wound care needs. FACE TO FACE ENCOUNTER: MEDICARE and MEDICAID PATIENTS: I certify that this patient is under my care and that I had a face-to-face encounter that meets the physician face-to-face encounter requirements with this patient on this date. The encounter with the patient was in whole or in part for the following MEDICAL CONDITION: (primary reason for [place]) MEDICAL NECESSITY: I certify, that based on my findings, NURSING services are a medically necessary home health service. HOME BOUND STATUS: I certify that my clinical findings support that this patient is homebound (i.e., Due to illness  or injury, pt requires aid of supportive devices such as crutches, cane, wheelchairs, walkers, the use of special transportation or the assistance of another person to leave their place of residence. There is a normal inability to leave the home and doing so requires considerable and taxing effort. Other absences are for medical reasons / religious services and are infrequent or of short duration when for other reasons). If current dressing causes regression in wound condition, may D/C ordered dressing product/s and apply Normal Saline Moist Dressing daily until next [place] / Other MD appointment. [place] of regression in wound condition at [phone]. Please direct any NON-WOUND related issues/requests for orders to patient's Primary Care Physician Wound #3 Right Toe Fifth: [place] Visits - [place] Nurse may visit PRN to address patient s wound care needs. FACE TO FACE ENCOUNTER: MEDICARE and MEDICAID PATIENTS: I certify that this patient is under my care and that I had a face-to-face encounter that meets the physician face-to-face encounter requirements with this  patient on this date. The encounter with the patient was in whole or in part for the following MEDICAL CONDITION: (primary reason for [place]) MEDICAL NECESSITY: I certify, that based on my findings, NURSING services are a medically necessary home health service. HOME BOUND STATUS: I certify that my clinical findings support that this patient is homebound (i.e., Due to illness or injury, pt requires aid of supportive devices such as crutches, cane, wheelchairs, walkers, the use of special transportation or the assistance of another person to leave their place of residence. There is a normal inability to leave the home and doing so requires considerable and taxing effort. Other absences are for medical reasons / religious services and are infrequent or of short duration when for other reasons). If current dressing causes regression in wound condition, may D/C ordered dressing product/s and apply Normal Saline Moist Dressing daily until next [place] / Other MD appointment. [place] of regression in wound condition at [phone]. Please direct any NON-WOUND related issues/requests for orders to patient's Primary Care Physician Wound #4 Left,Lateral Toe Third: [place] Visits - [place] Nurse may visit PRN to address patient s wound care needs. ARMANI, BRAR ([id number]) FACE TO FACE ENCOUNTER: MEDICARE and MEDICAID PATIENTS: I certify that this patient is under my care and that I had a face-to-face encounter that meets the physician face-to-face encounter requirements with this patient on this date. The encounter with the patient was in whole or in part for the following MEDICAL CONDITION: (primary reason for [place]) MEDICAL NECESSITY: I certify, that based on my findings, NURSING services are a medically necessary home health service. HOME BOUND STATUS: I certify that my clinical findings support that this patient is  homebound (i.e., Due to illness or injury, pt requires aid of supportive devices such as crutches, cane, wheelchairs, walkers, the use of special transportation or the assistance of another person to leave their place of residence. There is a normal inability to leave the home and doing so requires considerable and taxing effort. Other absences are for medical reasons / religious services and are infrequent or of short duration when for other reasons). If current dressing causes regression in wound condition, may D/C ordered dressing product/s and apply Normal Saline Moist Dressing daily until next [place] / Other MD appointment. [place] of regression in wound condition at [phone]. Please direct any NON-WOUND related issues/requests  for orders to patient's Primary Care Physician Wound #5 Left,Medial Toe Fourth: [place] Visits - [place] Nurse may visit PRN to address patient s wound care needs. FACE TO FACE ENCOUNTER: MEDICARE and MEDICAID PATIENTS: I certify that this patient is under my care and that I had a face-to-face encounter that meets the physician face-to-face encounter requirements with this patient on this date. The encounter with the patient was in whole or in part for the following MEDICAL CONDITION: (primary reason for [place]) MEDICAL NECESSITY: I certify, that based on my findings, NURSING services are a medically necessary home health service. HOME BOUND STATUS: I certify that my clinical findings support that this patient is homebound (i.e., Due to illness or injury, pt requires aid of supportive devices such as crutches, cane, wheelchairs, walkers, the use of special transportation or the assistance of another person to leave their place of residence. There is a normal inability to leave the home and doing so requires considerable and taxing effort. Other absences are for medical reasons / religious services  and are infrequent or of short duration when for other reasons). If current dressing causes regression in wound condition, may D/C ordered dressing product/s and apply Normal Saline Moist Dressing daily until next [place] / Other MD appointment. [place] of regression in wound condition at [phone]. Please direct any NON-WOUND related issues/requests for orders to patient's Primary Care Physician Wound #6 Left,Lateral Toe Fourth: [place] Visits - [place] Nurse may visit PRN to address patient s wound care needs. FACE TO FACE ENCOUNTER: MEDICARE and MEDICAID PATIENTS: I certify that this patient is under my care and that I had a face-to-face encounter that meets the physician face-to-face encounter requirements with this patient on this date. The encounter with the patient was in whole or in part for the following MEDICAL CONDITION: (primary reason for [place]) MEDICAL NECESSITY: I certify, that based on my findings, NURSING services are a medically necessary home health service. HOME BOUND STATUS: I certify that my clinical findings support that this patient is homebound (i.e., Due to illness or injury, pt requires aid of supportive devices such as crutches, cane, wheelchairs, walkers, the use of special transportation or the assistance of another person to leave their place of residence. There is a normal inability to leave the home and doing so requires considerable and taxing effort. Other absences are for medical reasons / religious services and are infrequent or of short duration when for other reasons). If current dressing causes regression in wound condition, may D/C ordered dressing product/s and apply Normal Saline Moist Dressing daily until next [place] / Other MD appointment. [place] of regression in wound condition at [phone]. Please direct any NON-WOUND related issues/requests  for orders to patient's Primary Care Physician Wound #7 Left,Medial Toe Fifth: [place] Visits - [place] Nurse may visit PRN to address patient s wound care needs. FACE TO FACE ENCOUNTER: MEDICARE and MEDICAID PATIENTS: I certify that this patient is under my care and that I had a face-to-face encounter that meets the physician face-to-face encounter requirements with this patient on this date. The encounter with the patient was in whole or in part for the following MEDICAL CONDITION: (primary reason for [place]) MEDICAL NECESSITY: I certify, that based on my findings, NURSING services are a medically necessary home health service. HOME BOUND STATUS: I certify that my clinical findings support that  this patient is homebound (i.e., Due to illness or injury, pt requires aid of supportive devices such as crutches, cane, wheelchairs, walkers, the use of special transportation or the assistance of another person to leave their place of residence. There is a normal inability to leave the home and doing so requires considerable and taxing effort. Other absences are for medical reasons / religious services and are infrequent or of short duration when for other reasons). If current dressing causes regression in wound condition, may D/C ordered dressing product/s and apply Normal Saline Moist Dressing daily until next [place] / Other MD appointment. [place] of regression in wound condition at [phone]. FREELAND, PRACHT ([id number]) Please direct any NON-WOUND related issues/requests for orders to patient's Primary Care Physician Wound #8 Right Achilles: [place] Visits - [place] Nurse may visit PRN to address patient s wound care needs. FACE TO FACE ENCOUNTER: MEDICARE and MEDICAID PATIENTS: I certify that this patient is under my care and that I had a face-to-face encounter that meets the physician  face-to-face encounter requirements with this patient on this date. The encounter with the patient was in whole or in part for the following MEDICAL CONDITION: (primary reason for [place]) MEDICAL NECESSITY: I certify, that based on my findings, NURSING services are a medically necessary home health service. HOME BOUND STATUS: I certify that my clinical findings support that this patient is homebound (i.e., Due to illness or injury, pt requires aid of supportive devices such as crutches, cane, wheelchairs, walkers, the use of special transportation or the assistance of another person to leave their place of residence. There is a normal inability to leave the home and doing so requires considerable and taxing effort. Other absences are for medical reasons / religious services and are infrequent or of short duration when for other reasons). If current dressing causes regression in wound condition, may D/C ordered dressing product/s and apply Normal Saline Moist Dressing daily until next [place] / Other MD appointment. [place] of regression in wound condition at [phone]. Please direct any NON-WOUND related issues/requests for orders to patient's Primary Care Physician Wound #9 Right,Lateral Foot: [place] Visits - [place] Nurse may visit PRN to address patient s wound care needs. FACE TO FACE ENCOUNTER: MEDICARE and MEDICAID PATIENTS: I certify that this patient is under my care and that I had a face-to-face encounter that meets the physician face-to-face encounter requirements with this patient on this date. The encounter with the patient was in whole or in part for the following MEDICAL CONDITION: (primary reason for [place]) MEDICAL NECESSITY: I certify, that based on my findings, NURSING services are a medically necessary home health service. HOME BOUND STATUS: I certify that my clinical findings support that this  patient is homebound (i.e., Due to illness or injury, pt requires aid of supportive devices such as crutches, cane, wheelchairs, walkers, the use of special transportation or the assistance of another person to leave their place of residence. There is a normal inability to leave the home and doing so requires considerable and taxing effort. Other absences are for medical reasons / religious services and are infrequent or of short duration when for other reasons). If current dressing causes regression in wound condition, may D/C ordered dressing product/s and apply Normal Saline Moist Dressing daily until next [place] / Other MD appointment. [place] of regression in wound condition at [phone].  Please direct any NON-WOUND related issues/requests for orders to patient's Primary Care Physician The following medication(s) was prescribed: cefdinir oral 300 mg capsule 1 1 capsule oral taken 2 times a day for 7 days until follow-up with Infectious disease starting 03/17/2019 1. My suggestion at this time is good to be that we go ahead and initiate treatment with a continuation of the wound care measures again I do not believe the dressings are the issue here I think blood flow is the main complicating factor at this point. Obviously if we get his blood flow better things should start to improve to some degree. He also is going to require surgical debridement however. Please see above for the specific wound care measures for each wound. 2. With regard to his vascular status he is good to be having vascular procedures performed on March 21, 2019 on the right lower extremity March 29, 2019 on the left lower extremity and he will be seeing infectious disease on January 29 as well which is excellent news. 3. With regard to the surgical evaluation at [facility] will follow up with him to see if we can see about getting this scheduled in the interim as soon as  possible. Obviously he cannot contemplate surgical intervention until the blood flow is better established. Obviously that is the primary goal right now and then infectious disease and surgical intervention followed. We will see patient back for reevaluation in 1 week here in the clinic. If anything worsens or changes patient will contact our office for additional recommendations. Electronic Signature(s) Signed: 03/17/2019 1:59:12 PM By: Worthy Keeler PA-C Entered By: Worthy Keeler on 03/17/2019 13:59:12 Strycharz, TURHAN CHILL ([id number]) MARCQUIS, RIDLON ([id number]) -------------------------------------------------------------------------------- SuperBill Details Patient Name: DARIOUS, REHMAN. Date of Service: 03/17/2019 Medical Record Number: [id number] Patient Account Number: [id number] Date of Birth/Sex: [birth year] (86 y.o. M) Treating RN: Army Melia Primary Care Provider: Caryl Pina Other Clinician: Referring Provider: Caryl Pina Treating Provider/Extender: Melburn Hake, Merari Pion Weeks in Treatment: 6 Diagnosis Coding ICD-10 Codes Code Description L89.610 Pressure ulcer of right heel, unstageable L89.890 Pressure ulcer of other site, unstageable E11.622 Type 2 diabetes mellitus with other skin ulcer L97.522 Non-pressure chronic ulcer of other part of left foot with fat layer exposed L97.512 Non-pressure chronic ulcer of other part of right foot with fat layer exposed I73.89 Other specified peripheral vascular diseases I25.10 Atherosclerotic heart disease of native coronary artery without angina pectoris I10 Essential (primary) hypertension N39.498 Other specified urinary incontinence Physician Procedures CPT4 Code: [id number] Description: 99214 - WC PHYS LEVEL 4 - EST PT ICD-10 Diagnosis Description L89.610 Pressure ulcer of right heel, unstageable L89.890 Pressure ulcer of other site, unstageable E11.622 Type 2 diabetes mellitus with other skin ulcer L97.522 Non-pressure   chronic ulcer of other part of left foot with fat Modifier: layer exposed Quantity: 1 Electronic Signature(s) Signed: 03/17/2019 1:59:32 PM By: Worthy Keeler PA-C Entered By: Worthy Keeler on 03/17/2019 13:59:31

## 2019-03-17 NOTE — Telephone Encounter (Signed)
I made the team aware

## 2019-03-17 NOTE — Progress Notes (Addendum)
[name] ([id number]) Visit Report for 03/17/2019 Arrival Information Details Patient Name: [name], [name]. Date of Service: 03/17/2019 1:00 PM Medical Record Number: [id number] Patient Account Number: [id number] Date of Birth/Sex: [birth year] (86 y.o. M) Treating RN: Cornell Barman Primary Care Shae Hinnenkamp: Caryl Pina Other Clinician: Referring Zionna Homewood: Caryl Pina Treating Shantera Monts/Extender: Melburn Hake, [name]: 6 Visit Information History Since Last Visit Added or deleted any medications: No Patient Arrived: Wheel Chair Any new allergies or adverse reactions: No Arrival Time: 12:56 Had a fall or experienced change in No Accompanied By: son activities of daily living that may affect Transfer Assistance: None risk of falls: Patient Identification Verified: Yes Signs or symptoms of abuse/neglect since last visito No Secondary Verification Process Completed: Yes Hospitalized since last visit: No Implantable device outside of the clinic excluding No cellular tissue based products placed in the center since last visit: Pain Present Now: No Electronic Signature(s) Signed: 03/17/2019 5:20:48 PM By: Gretta Cool, BSN, RN, CWS, Kim RN, BSN Entered By: Gretta Cool, BSN, RN, CWS, Kim on 03/17/2019 12:57:34 [name] ([id number]) -------------------------------------------------------------------------------- Clinic Level of Care Assessment Details Patient Name: [name], [name]. Date of Service: 03/17/2019 1:00 PM Medical Record Number: [id number] Patient Account Number: [id number] Date of Birth/Sex: 12-07-1930 (86 y.o. M) Treating RN: Army Melia Primary Care Brack Shaddock: Caryl Pina Other Clinician: Referring Tushar Enns: Caryl Pina Treating Tayona Sarnowski/Extender: Melburn Hake, [name]: 6 Clinic Level of Care Assessment Items TOOL 4 Quantity Score []  - Use when only an EandM is performed on FOLLOW-UP visit 0 ASSESSMENTS - Nursing Assessment /  Reassessment X - Reassessment of Co-morbidities (includes updates in patient status) 1 10 X- 1 5 Reassessment of Adherence to Treatment Plan ASSESSMENTS - Wound and Skin Assessment / Reassessment []  - Simple Wound Assessment / Reassessment - one wound 0 X- 12 5 Complex Wound Assessment / Reassessment - multiple wounds []  - 0 Dermatologic / Skin Assessment (not related to wound area) ASSESSMENTS - Focused Assessment []  - Circumferential Edema Measurements - multi extremities 0 []  - 0 Nutritional Assessment / Counseling / Intervention []  - 0 Lower Extremity Assessment (monofilament, tuning fork, pulses) []  - 0 Peripheral Arterial Disease Assessment (using hand held doppler) ASSESSMENTS - Ostomy and/or Continence Assessment and Care []  - Incontinence Assessment and Management 0 []  - 0 Ostomy Care Assessment and Management (repouching, etc.) PROCESS - Coordination of Care X - Simple Patient / Family Education for ongoing care 1 15 []  - 0 Complex (extensive) Patient / Family Education for ongoing care X- 1 10 Staff obtains Programmer, systems, Records, Test Results / Process Orders []  - 0 Staff telephones HHA, Nursing Homes / Clarify orders / etc []  - 0 Routine Transfer to another Facility (non-emergent condition) []  - 0 Routine Hospital Admission (non-emergent condition) []  - 0 New Admissions / Biomedical engineer / Ordering NPWT, Apligraf, etc. []  - 0 Emergency Hospital Admission (emergent condition) X- 1 10 Simple Discharge Coordination [name] ([id number]) []  - 0 Complex (extensive) Discharge Coordination PROCESS - Special Needs []  - Pediatric / Minor Patient Management 0 []  - 0 Isolation Patient Management []  - 0 Hearing / Language / Visual special needs []  - 0 Assessment of Community assistance (transportation, D/C planning, etc.) []  - 0 Additional assistance / Altered mentation []  - 0 Support Surface(s) Assessment (bed, cushion, seat, etc.) INTERVENTIONS -  Wound Cleansing / Measurement []  - Simple Wound Cleansing - one wound 0 X- 12 5 Complex Wound Cleansing - multiple wounds X- 1 5 Wound Imaging (  photographs - any number of wounds) []  - 0 Wound Tracing (instead of photographs) []  - 0 Simple Wound Measurement - one wound X- 12 5 Complex Wound Measurement - multiple wounds INTERVENTIONS - Wound Dressings []  - Small Wound Dressing one or multiple wounds 0 X- 12 15 Medium Wound Dressing one or multiple wounds []  - 0 Large Wound Dressing one or multiple wounds []  - 0 Application of Medications - topical []  - 0 Application of Medications - injection INTERVENTIONS - Miscellaneous []  - External ear exam 0 []  - 0 Specimen Collection (cultures, biopsies, blood, body fluids, etc.) []  - 0 Specimen(s) / Culture(s) sent or taken to Lab for analysis []  - 0 Patient Transfer (multiple staff / Civil Service fast streamer / Similar devices) []  - 0 Simple Staple / Suture removal (25 or less) []  - 0 Complex Staple / Suture removal (26 or more) []  - 0 Hypo / Hyperglycemic Management (close monitor of Blood Glucose) []  - 0 Ankle / Brachial Index (ABI) - do not check if billed separately X- 1 5 Vital Signs [name], [name] ([id number]) Has the patient been seen at the hospital within the last three years: Yes Total Score: 420 Level Of Care: New/Established - Level 5 Electronic Signature(s) Signed: 03/17/2019 4:17:07 PM By: Army Melia Entered By: Army Melia on 03/17/2019 13:51:19 [name] ([id number]) -------------------------------------------------------------------------------- Encounter Discharge Information Details Patient Name: [name], [name]. Date of Service: 03/17/2019 1:00 PM Medical Record Number: [id number] Patient Account Number: [id number] Date of Birth/Sex: [birth year] (86 y.o. M) Treating RN: Army Melia Primary Care Saraann Enneking: Caryl Pina Other Clinician: Referring Viridiana Spaid: Caryl Pina Treating Correen Bubolz/Extender:  Melburn Hake, [name]: 6 Encounter Discharge Information Items Discharge Condition: Stable Ambulatory Status: Wheelchair Discharge Destination: Home Transportation: Private Auto Accompanied By: family Schedule Follow-up Appointment: Yes Clinical Summary of Care: Electronic Signature(s) Signed: 03/17/2019 4:17:07 PM By: Army Melia Entered By: Army Melia on 03/17/2019 13:52:31 Saavedra, Marca Ho ([id number]) -------------------------------------------------------------------------------- Lower Extremity Assessment Details Patient Name: [name], [name]. Date of Service: 03/17/2019 1:00 PM Medical Record Number: [id number] Patient Account Number: [id number] Date of Birth/Sex: [birth year] (86 y.o. M) Treating RN: Cornell Barman Primary Care Reeder Brisby: Caryl Pina Other Clinician: Referring Michaela Shankel: Caryl Pina Treating Tzippy Testerman/Extender: Melburn Hake, [name]: 6 Edema Assessment Assessed: [Left: No] [Right: No] Edema: [Left: No] [Right: No] Vascular Assessment Pulses: Dorsalis Pedis Palpable: [Left:No] [Right:No] Posterior Tibial Palpable: [Left:No] [Right:No] Notes Intervention Monday. Electronic Signature(s) Signed: 03/17/2019 5:20:48 PM By: Gretta Cool, BSN, RN, CWS, Kim RN, BSN Entered By: Gretta Cool, BSN, RN, CWS, Kim on 03/17/2019 13:30:19 EIDAN, MUELLNER ([id number]) -------------------------------------------------------------------------------- Multi Wound Chart Details Patient Name: [name], [name]. Date of Service: 03/17/2019 1:00 PM Medical Record Number: [id number] Patient Account Number: [id number] Date of Birth/Sex: [birth year] (86 y.o. M) Treating RN: Army Melia Primary Care Romyn Boswell: Caryl Pina Other Clinician: Referring Katrinka Herbison: Caryl Pina Treating Sierra Bissonette/Extender: Melburn Hake, [name]: 6 Vital Signs Height(in): 6 Pulse(bpm): 9 Weight(lbs): 235 Blood Pressure(mmHg): 140/74 Body Mass Index(BMI):  36 Temperature(F): 97.7 Respiratory Rate 16 (breaths/min): Photos: [1:No Photos] [10:No Photos] [11:No Photos] Wound Location: [1:Right Calcaneus] [10:Right Foot - Dorsal] [11:Left Calcaneus] Wounding Event: [1:Gradually Appeared] [10:Gradually Appeared] [11:Gradually Appeared] Primary Etiology: [1:Pressure Ulcer] [10:Arterial Insufficiency Ulcer] [11:Arterial Insufficiency Ulcer] Comorbid History: [1:N/A] [10:Cataracts, Hypertension, Myocardial Infarction, Type II Diabetes, Osteoarthritis, Neuropathy] [11:Cataracts, Hypertension, Myocardial Infarction, Type II Diabetes, Osteoarthritis, Neuropathy] Date Acquired: [1:12/05/2018] [10:02/08/2019] [11:02/08/2019] Weeks of Treatment: [1:6] [10:0] [11:0] Wound Status: [1:Open] [10:Open] [11:Open] Measurements L x W x D [1:4.5x5x0.1] [  10:1.9x1x0.1] [11:3x2x0.1] (cm) Area (cm) : [1:17.671] [10:1.492] [11:4.712] Volume (cm) : [1:1.767] [10:0.149] [11:0.471] % Reduction in Area: [1:-151.10%] [10:0.00%] [11:N/A] % Reduction in Volume: [1:49.80%] [10:0.00%] [11:N/A] Classification: [1:Unstageable/Unclassified] [10:Unclassifiable] [11:Unclassifiable] Exudate Amount: [1:N/A] [10:None Present] [11:None Present] Exudate Type: [1:N/A] [10:N/A] [11:N/A] Exudate Color: [1:N/A] [10:N/A] [11:N/A] Wound Margin: [1:N/A] [10:Distinct, outline attached] [11:Fibrotic scar, thickened scar] Granulation Amount: [1:N/A] [10:None Present (0%)] [11:None Present (0%)] Necrotic Amount: [1:N/A] [10:Large (67-100%)] [11:Large (67-100%)] Necrotic Tissue: [1:N/A N/A] [10:Eschar None] [11:Eschar N/A] Wound Number: 12 2 3  Photos: No Photos No Photos No Photos Wound Location: Left Metatarsal head fifth Right Toe Fourth Right Toe Fifth Wounding Event: Gradually Appeared Gradually Appeared Pressure Injury Primary Etiology: Arterial Insufficiency Ulcer Pressure Ulcer Pressure Ulcer Comorbid History: Cataracts, Hypertension, N/A N/A Myocardial Infarction, Type II Diabetes,  Osteoarthritis, Neuropathy Date Acquired: 02/08/2019 09/05/2018 09/05/2018 Jacqualyn Posey ([id number]) Weeks of Treatment: 0 6 6 Wound Status: Open Open Open Measurements L x W x D 1x0.6x0.1 0.2x0.4x0.2 0.8x1x0.1 (cm) Area (cm) : 0.471 0.063 0.628 Volume (cm) : 0.047 0.013 0.063 % Reduction in Area: N/A 33.00% -100.00% % Reduction in Volume: N/A -44.40% -103.20% Classification: Unclassifiable Unstageable/Unclassified Unstageable/Unclassified Exudate Amount: None Present N/A N/A Exudate Type: N/A N/A N/A Exudate Color: N/A N/A N/A Wound Margin: Flat and Intact N/A N/A Granulation Amount: None Present (0%) N/A N/A Necrotic Amount: Large (67-100%) N/A N/A Necrotic Tissue: Eschar N/A N/A Exposed Structures: N/A N/A N/A Epithelialization: None N/A N/A Wound Number: 4 5 6  Photos: No Photos No Photos No Photos Wound Location: Left, Lateral Toe Third Left, Medial Toe Fourth Left, Lateral Toe Fourth Wounding Event: Gradually Appeared Gradually Appeared Gradually Appeared Primary Etiology: Diabetic Wound/Ulcer of the Diabetic Wound/Ulcer of the Diabetic Wound/Ulcer of the Lower Extremity Lower Extremity Lower Extremity Comorbid History: N/A N/A N/A Date Acquired: 03/03/2019 03/03/2019 03/03/2019 Weeks of Treatment: 2 2 2  Wound Status: Open Open Open Measurements L x W x D 0.3x0.4x0.4 0.5x0.6x0.3 0.4x0.5x0.3 (cm) Area (cm) : 0.094 0.236 0.157 Volume (cm) : 0.038 0.071 0.047 % Reduction in Area: 0.00% -43.00% 87.20% % Reduction in Volume: -322.20% -343.70% 80.80% Classification: Grade 1 Grade 1 Grade 1 Exudate Amount: N/A N/A N/A Exudate Type: N/A N/A N/A Exudate Color: N/A N/A N/A Wound Margin: N/A N/A N/A Granulation Amount: N/A N/A N/A Necrotic Amount: N/A N/A N/A Necrotic Tissue: N/A N/A N/A Exposed Structures: N/A N/A N/A Epithelialization: N/A N/A N/A Wound Number: [id number] 9  Photos: No Photos No Photos Wound Location: Left, Medial Toe Fifth Right Achilles Right Foot -  Lateral OLVIN, ROHR ([id number]) Wounding Event: Gradually Appeared Gradually Appeared Gradually Appeared Primary Etiology: Diabetic Wound/Ulcer of the Arterial Insufficiency Ulcer Arterial Insufficiency Ulcer Lower Extremity Comorbid History: N/A Cataracts, Hypertension, Cataracts, Hypertension, Myocardial Infarction, Type II Myocardial Infarction, Type II Diabetes, Osteoarthritis, Diabetes, Osteoarthritis, Neuropathy Neuropathy Date Acquired: 03/03/2019 02/08/2019 02/08/2019 Weeks of Treatment: 2 0 0 Wound Status: Open Open Open Measurements L x W x D 0.9x2.5x0.1 10x4.5x0.7 1.7x1.5x0.1 (cm) Area (cm) : 1.767 35.343 2.003 Volume (cm) : 0.177 24.74 0.2 % Reduction in Area: -70.40% 0.00% N/A % Reduction in Volume: -70.20% 0.00% N/A Classification: Grade 1 Full Thickness Without Unclassifiable Exposed Support Structures Exudate Amount: N/A Medium None Present Exudate Type: N/A Serous N/A Exudate Color: N/A amber N/A Wound Margin: N/A Thickened Flat and Intact Granulation Amount: N/A None Present (0%) None Present (0%) Necrotic Amount: N/A Large (67-100%) Large (67-100%) Necrotic Tissue: N/A Eschar, Adherent Slough Eschar Exposed Structures: N/A Fat Layer (Subcutaneous Fascia: No Tissue) Exposed: Yes  Fat Layer (Subcutaneous Tissue) Exposed: No Tendon: No Muscle: No Joint: No Bone: No Epithelialization: N/A None None Treatment Notes Electronic Signature(s) Signed: 03/17/2019 4:17:07 PM By: Army Melia Entered By: Army Melia on 03/17/2019 13:42:26 Brosious, Marca Ho ([id number]) -------------------------------------------------------------------------------- Multi-Disciplinary Care Plan Details Patient Name: [name], [name]. Date of Service: 03/17/2019 1:00 PM Medical Record Number: [id number] Patient Account Number: [id number] Date of Birth/Sex: [birth year] (86 y.o. M) Treating RN: Army Melia Primary Care Deztinee Lohmeyer: Caryl Pina Other Clinician: Referring  Elisse Pennick: Caryl Pina Treating Savahna Casados/Extender: Melburn Hake, [name]: 6 Active Inactive Orientation to the Wound Care Program Nursing Diagnoses: Knowledge deficit related to the wound healing center program Goals: Patient/caregiver will verbalize understanding of the [facility] Date Initiated: 02/03/2019 Target Resolution Date: 03/04/2019 Goal Status: Active Interventions: Provide education on orientation to the wound center Notes: Pressure Nursing Diagnoses: Knowledge deficit related to causes and risk factors for pressure ulcer development Goals: Patient/caregiver will verbalize risk factors for pressure ulcer development Date Initiated: 02/03/2019 Target Resolution Date: 03/04/2019 Goal Status: Active Interventions: Assess: immobility, friction, shearing, incontinence upon admission and as needed Notes: Wound/Skin Impairment Nursing Diagnoses: Impaired tissue integrity Goals: Ulcer/skin breakdown will have a volume reduction of 30% by week 4 Date Initiated: 02/03/2019 Target Resolution Date: 03/04/2019 Goal Status: Active Interventions: Assess ulceration(s) every visit HASAAN, RADDE ([id number]) Notes: Electronic Signature(s) Signed: 03/17/2019 4:17:07 PM By: Army Melia Entered By: Army Melia on 03/17/2019 13:41:19 Pohlmann, Marca Ho ([id number]) -------------------------------------------------------------------------------- Pain Assessment Details Patient Name: KENWOOD, ROSIAK. Date of Service: 03/17/2019 1:00 PM Medical Record Number: [id number] Patient Account Number: [id number] Date of Birth/Sex: [birth year] (86 y.o. M) Treating RN: Cornell Barman Primary Care Kayle Correa: Caryl Pina Other Clinician: Referring Haidy Kackley: Caryl Pina Treating Anavi Branscum/Extender: Melburn Hake, [name]: 6 Active Problems Location of Pain Severity and Description of Pain Patient Has Paino Yes Site Locations Pain  Management and Medication Current Pain Management: Goals for Pain Management Shoulder and ankle Electronic Signature(s) Signed: 03/17/2019 5:20:48 PM By: Gretta Cool, BSN, RN, CWS, Kim RN, BSN Entered By: Gretta Cool, BSN, RN, CWS, Kim on 03/17/2019 12:57:50 Umana, Marca Ho ([id number]) -------------------------------------------------------------------------------- Patient/Caregiver Education Details Patient Name: UNNAMED, ZEIEN. Date of Service: 03/17/2019 1:00 PM Medical Record Number: [id number] Patient Account Number: [id number] Date of Birth/Gender: 1930-06-17 (86 y.o. M) Treating RN: Army Melia Primary Care Physician: Caryl Pina Other Clinician: Referring Physician: Caryl Pina Treating Physician/Extender: Sharalyn Ink in Treatment: 6 Education Assessment Education Provided To: Patient Education Topics Provided Wound/Skin Impairment: Handouts: Caring for Your Ulcer Methods: Demonstration, Explain/Verbal Responses: State content correctly Electronic Signature(s) Signed: 03/17/2019 4:17:07 PM By: Army Melia Entered By: Army Melia on 03/17/2019 13:51:33 Truett, Marca Ho ([id number]) -------------------------------------------------------------------------------- Wound Assessment Details Patient Name: [name], [name]. Date of Service: 03/17/2019 1:00 PM Medical Record Number: [id number] Patient Account Number: [id number] Date of Birth/Sex: 11-Jan-1931 (86 y.o. M) Treating RN: Cornell Barman Primary Care Donice Alperin: Caryl Pina Other Clinician: Referring Maddox Bratcher: Caryl Pina Treating Felicie Kocher/Extender: Melburn Hake, [name]: 6 Wound Status Wound Number: 1 Primary Etiology: Pressure Ulcer Wound Location: Right Calcaneus Wound Status: Open Wounding Event: Gradually Appeared Date Acquired: 12/05/2018 Weeks Of Treatment: 6 Clustered Wound: No Wound Measurements Length: (cm) 4.5 Width: (cm) 5 Depth: (cm) 0.1 Area: (cm)  17.671 Volume: (cm) 1.767 % Reduction in Area: -151.1% % Reduction in Volume: 49.8% Wound Description Classification: Unstageable/Unclassified Treatment Notes Wound #1 (Right Calcaneus) Notes Iodoflex on right achillies Silver cell to left toe wounds, betadine to all other  wounds Electronic Signature(s) Signed: 03/17/2019 5:20:48 PM By: Gretta Cool, BSN, RN, CWS, Kim RN, BSN Entered By: Gretta Cool, BSN, RN, CWS, Kim on 03/17/2019 13:18:51 Schneller, Marca Ho ([id number]) -------------------------------------------------------------------------------- Wound Assessment Details Patient Name: [name], [name]. Date of Service: 03/17/2019 1:00 PM Medical Record Number: [id number] Patient Account Number: [id number] Date of Birth/Sex: [birth year] (86 y.o. M) Treating RN: Cornell Barman Primary Care Shateka Petrea: Caryl Pina Other Clinician: Referring Matei Magnone: Caryl Pina Treating Alaia Lordi/Extender: Melburn Hake, [name]: 6 Wound Status Wound Number: 10 Primary Arterial Insufficiency Ulcer Etiology: Wound Location: Right Foot - Dorsal Wound Open Wounding Event: Gradually Appeared Status: Date Acquired: 02/08/2019 Comorbid Cataracts, Hypertension, Myocardial Infarction, Weeks Of Treatment: 0 History: Type II Diabetes, Osteoarthritis, Neuropathy Clustered Wound: No Wound Measurements Length: (cm) 1.9 Width: (cm) 1 Depth: (cm) 0.1 Area: (cm) 1.492 Volume: (cm) 0.149 % Reduction in Area: 0% % Reduction in Volume: 0% Epithelialization: None Tunneling: No Undermining: No Wound Description Classification: Unclassifiable Wound Margin: Distinct, outline attached Exudate Amount: None Present Foul Odor After Cleansing: No Slough/Fibrino No Wound Bed Granulation Amount: None Present (0%) Exposed Structure Necrotic Amount: Large (67-100%) Fascia Exposed: No Necrotic Quality: Eschar Fat Layer (Subcutaneous Tissue) Exposed: No Tendon Exposed: No Muscle Exposed: No Joint  Exposed: No Bone Exposed: No Treatment Notes Wound #10 (Right, Dorsal Foot) Notes Iodoflex on right achillies and left toe wounds, betadine to all other wounds Electronic Signature(s) Signed: 03/17/2019 5:20:48 PM By: Gretta Cool, BSN, RN, CWS, Kim RN, BSN Entered By: Gretta Cool, BSN, RN, CWS, Kim on 03/17/2019 13:27:07 Eastep, Marca Ho ([id number]) -------------------------------------------------------------------------------- Wound Assessment Details Patient Name: [name], [name]. Date of Service: 03/17/2019 1:00 PM Medical Record Number: [id number] Patient Account Number: [id number] Date of Birth/Sex: Jan 17, 1931 (86 y.o. M) Treating RN: Cornell Barman Primary Care Easten Maceachern: Caryl Pina Other Clinician: Referring Jermery Caratachea: Caryl Pina Treating Rether Rison/Extender: Melburn Hake, [name]: 6 Wound Status Wound Number: 11 Primary Arterial Insufficiency Ulcer Etiology: Wound Location: Left Calcaneus Wound Open Wounding Event: Gradually Appeared Status: Date Acquired: 02/08/2019 Comorbid Cataracts, Hypertension, Myocardial Infarction, Weeks Of Treatment: 0 History: Type II Diabetes, Osteoarthritis, Neuropathy Clustered Wound: No Wound Measurements Length: (cm) Width: (cm) Depth: (cm) Area: (cm) Volume: (cm) 3 % Reduction in Area: 2 % Reduction in Volume: 0.1 4.712 0.471 Wound Description Classification: Unclassifiable Wound Margin: Fibrotic scar, thickened scar Exudate Amount: None Present Foul Odor After Cleansing: No Slough/Fibrino No Wound Bed Granulation Amount: None Present (0%) Exposed Structure Necrotic Amount: Large (67-100%) Fascia Exposed: No Necrotic Quality: Eschar Fat Layer (Subcutaneous Tissue) Exposed: No Tendon Exposed: No Muscle Exposed: No Joint Exposed: No Bone Exposed: No Treatment Notes Wound #11 (Left Calcaneus) Notes Iodoflex on right achillies and left toe wounds, betadine to all other wounds Electronic Signature(s) Signed:  03/17/2019 5:20:48 PM By: Gretta Cool, BSN, RN, CWS, Kim RN, BSN Entered By: Gretta Cool, BSN, RN, CWS, Kim on 03/17/2019 13:28:04 Jacqualyn Posey ([id number]) -------------------------------------------------------------------------------- Wound Assessment Details Patient Name: [name], [name]. Date of Service: 03/17/2019 1:00 PM Medical Record Number: [id number] Patient Account Number: [id number] Date of Birth/Sex: Apr 22, 1930 (86 y.o. M) Treating RN: Cornell Barman Primary Care Hadja Harral: Caryl Pina Other Clinician: Referring Tajia Szeliga: Caryl Pina Treating Kostas Marrow/Extender: Melburn Hake, [name]: 6 Wound Status Wound Number: 12 Primary Arterial Insufficiency Ulcer Etiology: Wound Location: Left Metatarsal head fifth Wound Open Wounding Event: Gradually Appeared Status: Date Acquired: 02/08/2019 Comorbid Cataracts, Hypertension, Myocardial Infarction, Weeks Of Treatment: 0 History: Type II Diabetes, Osteoarthritis, Neuropathy Clustered Wound: No Wound Measurements Length: (cm) 1 Width: (cm)  0.6 Depth: (cm) 0.1 Area: (cm) 0.471 Volume: (cm) 0.047 % Reduction in Area: % Reduction in Volume: Epithelialization: None Wound Description Classification: Unclassifiable Wound Margin: Flat and Intact Exudate Amount: None Present Foul Odor After Cleansing: No Slough/Fibrino No Wound Bed Granulation Amount: None Present (0%) Necrotic Amount: Large (67-100%) Necrotic Quality: Eschar Treatment Notes Wound #12 (Left Metatarsal head fifth) Notes Iodoflex on right achillies and left toe wounds, betadine to all other wounds Electronic Signature(s) Signed: 03/17/2019 5:20:48 PM By: Gretta Cool, BSN, RN, CWS, Kim RN, BSN Entered By: Gretta Cool, BSN, RN, CWS, Kim on 03/17/2019 13:29:24 Jacqualyn Posey ([id number]) -------------------------------------------------------------------------------- Wound Assessment Details Patient Name: [name], [name]. Date of Service: 03/17/2019 1:00  PM Medical Record Number: [id number] Patient Account Number: [id number] Date of Birth/Sex: 10-10-1930 (86 y.o. M) Treating RN: Cornell Barman Primary Care Jennesis Ramaswamy: Caryl Pina Other Clinician: Referring Rasheema Truluck: Caryl Pina Treating Debralee Braaksma/Extender: Melburn Hake, [name]: 6 Wound Status Wound Number: 2 Primary Etiology: Pressure Ulcer Wound Location: Right Toe Fourth Wound Status: Open Wounding Event: Gradually Appeared Date Acquired: 09/05/2018 Weeks Of Treatment: 6 Clustered Wound: No Wound Measurements Length: (cm) 0.2 Width: (cm) 0.4 Depth: (cm) 0.2 Area: (cm) 0.063 Volume: (cm) 0.013 % Reduction in Area: 33% % Reduction in Volume: -44.4% Wound Description Classification: Unstageable/Unclassified Treatment Notes Wound #2 (Right Toe Fourth) Notes Iodoflex on right achillies and left toe wounds, betadine to all other wounds Electronic Signature(s) Signed: 03/17/2019 5:20:48 PM By: Gretta Cool, BSN, RN, CWS, Kim RN, BSN Entered By: Gretta Cool, BSN, RN, CWS, Kim on 03/17/2019 13:13:54 Cerveny, Marca Ho ([id number]) -------------------------------------------------------------------------------- Wound Assessment Details Patient Name: [name], [name]. Date of Service: 03/17/2019 1:00 PM Medical Record Number: [id number] Patient Account Number: [id number] Date of Birth/Sex: [birth year] (86 y.o. M) Treating RN: Cornell Barman Primary Care Lulie Hurd: Caryl Pina Other Clinician: Referring Swayze Kozuch: Caryl Pina Treating Taraneh Metheney/Extender: Melburn Hake, [name]: 6 Wound Status Wound Number: 3 Primary Etiology: Pressure Ulcer Wound Location: Right Toe Fifth Wound Status: Open Wounding Event: Pressure Injury Date Acquired: 09/05/2018 Weeks Of Treatment: 6 Clustered Wound: No Wound Measurements Length: (cm) 0.8 Width: (cm) 1 Depth: (cm) 0.1 Area: (cm) 0.628 Volume: (cm) 0.063 % Reduction in Area: -100% % Reduction in Volume:  -103.2% Wound Description Classification: Unstageable/Unclassified Treatment Notes Wound #3 (Right Toe Fifth) Notes Iodoflex on right achillies and left toe wounds, betadine to all other wounds Electronic Signature(s) Signed: 03/17/2019 5:20:48 PM By: Gretta Cool, BSN, RN, CWS, Kim RN, BSN Entered By: Gretta Cool, BSN, RN, CWS, Kim on 03/17/2019 13:14:13 Tupy, Marca Ho ([id number]) -------------------------------------------------------------------------------- Wound Assessment Details Patient Name: [name], MROZEK. Date of Service: 03/17/2019 1:00 PM Medical Record Number: [id number] Patient Account Number: [id number] Date of Birth/Sex: [birth year] (86 y.o. M) Treating RN: Cornell Barman Primary Care Thurlow Gallaga: Caryl Pina Other Clinician: Referring Garvis Downum: Caryl Pina Treating Veryl Winemiller/Extender: Melburn Hake, [name]: 6 Wound Status Wound Number: 4 Primary Diabetic Wound/Ulcer of the Lower Etiology: Extremity Wound Location: Left, Lateral Toe Third Wound Status: Open Wounding Event: Gradually Appeared Date Acquired: 03/03/2019 Weeks Of Treatment: 2 Clustered Wound: No Wound Measurements Length: (cm) 0.3 Width: (cm) 0.4 Depth: (cm) 0.4 Area: (cm) 0.094 Volume: (cm) 0.038 % Reduction in Area: 0% % Reduction in Volume: -322.2% Wound Description Classification: Grade 1 Treatment Notes Wound #4 (Left, Lateral Toe Third) Notes Iodoflex on right achillies and left toe wounds, betadine to all other wounds Electronic Signature(s) Signed: 03/17/2019 5:20:48 PM By: Gretta Cool, BSN, RN, CWS, Kim RN, BSN Entered By: Gretta Cool, BSN, RN, CWS,  Kim on 03/17/2019 13:15:10 Bourget, Marca Ho ([id number]) -------------------------------------------------------------------------------- Wound Assessment Details Patient Name: TIBURCIO, LINDER. Date of Service: 03/17/2019 1:00 PM Medical Record Number: [id number] Patient Account Number: [id number] Date of Birth/Sex: 1930-12-25 (86 y.o.  M) Treating RN: Cornell Barman Primary Care Naksh Radi: Caryl Pina Other Clinician: Referring Mariano Doshi: Caryl Pina Treating Tyriana Helmkamp/Extender: Melburn Hake, [name]: 6 Wound Status Wound Number: 5 Primary Diabetic Wound/Ulcer of the Lower Etiology: Extremity Wound Location: Left, Medial Toe Fourth Wound Status: Open Wounding Event: Gradually Appeared Date Acquired: 03/03/2019 Weeks Of Treatment: 2 Clustered Wound: No Wound Measurements Length: (cm) 0.5 Width: (cm) 0.6 Depth: (cm) 0.3 Area: (cm) 0.236 Volume: (cm) 0.071 % Reduction in Area: -43% % Reduction in Volume: -343.7% Wound Description Classification: Grade 1 Treatment Notes Wound #5 (Left, Medial Toe Fourth) Notes Iodoflex on right achillies and left toe wounds, betadine to all other wounds Electronic Signature(s) Signed: 03/17/2019 5:20:48 PM By: Gretta Cool, BSN, RN, CWS, Kim RN, BSN Entered By: Gretta Cool, BSN, RN, CWS, Kim on 03/17/2019 13:16:12 Voit, Marca Ho ([id number]) -------------------------------------------------------------------------------- Wound Assessment Details Patient Name: WORTHY, BOSCHERT. Date of Service: 03/17/2019 1:00 PM Medical Record Number: [id number] Patient Account Number: [id number] Date of Birth/Sex: [birth year] (86 y.o. M) Treating RN: Cornell Barman Primary Care Kadarius Cuffe: Caryl Pina Other Clinician: Referring Sokha Craker: Caryl Pina Treating Kimmora Risenhoover/Extender: Melburn Hake, [name]: 6 Wound Status Wound Number: 6 Primary Diabetic Wound/Ulcer of the Lower Etiology: Extremity Wound Location: Left, Lateral Toe Fourth Wound Status: Open Wounding Event: Gradually Appeared Date Acquired: 03/03/2019 Weeks Of Treatment: 2 Clustered Wound: No Wound Measurements Length: (cm) 0.4 Width: (cm) 0.5 Depth: (cm) 0.3 Area: (cm) 0.157 Volume: (cm) 0.047 % Reduction in Area: 87.2% % Reduction in Volume: 80.8% Wound Description Classification: Grade  1 Treatment Notes Wound #6 (Left, Lateral Toe Fourth) Notes Iodoflex on right achillies and left toe wounds, betadine to all other wounds Electronic Signature(s) Signed: 03/17/2019 5:20:48 PM By: Gretta Cool, BSN, RN, CWS, Kim RN, BSN Entered By: Gretta Cool, BSN, RN, CWS, Kim on 03/17/2019 13:17:43 Sewell, Marca Ho ([id number]) -------------------------------------------------------------------------------- Wound Assessment Details Patient Name: BRODI, KARI. Date of Service: 03/17/2019 1:00 PM Medical Record Number: [id number] Patient Account Number: [id number] Date of Birth/Sex: Sep 09, 1930 (86 y.o. M) Treating RN: Cornell Barman Primary Care Maryon Kemnitz: Caryl Pina Other Clinician: Referring Bodie Abernethy: Caryl Pina Treating Dala Breault/Extender: Melburn Hake, [name]: 6 Wound Status Wound Number: 7 Primary Diabetic Wound/Ulcer of the Lower Etiology: Extremity Wound Location: Left, Medial Toe Fifth Wound Status: Open Wounding Event: Gradually Appeared Date Acquired: 03/03/2019 Weeks Of Treatment: 2 Clustered Wound: No Wound Measurements Length: (cm) 0.9 Width: (cm) 2.5 Depth: (cm) 0.1 Area: (cm) 1.767 Volume: (cm) 0.177 % Reduction in Area: -70.4% % Reduction in Volume: -70.2% Wound Description Classification: Grade 1 Treatment Notes Wound #7 (Left, Medial Toe Fifth) Notes Iodoflex on right achillies and left toe wounds, betadine to all other wounds Electronic Signature(s) Signed: 03/17/2019 5:20:48 PM By: Gretta Cool, BSN, RN, CWS, Kim RN, BSN Entered By: Gretta Cool, BSN, RN, CWS, Kim on 03/17/2019 13:18:05 Hyle, Marca Ho ([id number]) -------------------------------------------------------------------------------- Wound Assessment Details Patient Name: ASTON, LIESKE. Date of Service: 03/17/2019 1:00 PM Medical Record Number: [id number] Patient Account Number: [id number] Date of Birth/Sex: 06-13-1930 (86 y.o. M) Treating RN: Cornell Barman Primary Care Mihcael Ledee: Caryl Pina Other Clinician: Referring Vedh Ptacek: Caryl Pina Treating Icess Bertoni/Extender: Melburn Hake, [name]: 6 Wound Status Wound Number: 8 Primary Arterial Insufficiency Ulcer Etiology: Wound Location: Right Achilles Wound Open Wounding Event: Gradually  Appeared Status: Date Acquired: 02/08/2019 Comorbid Cataracts, Hypertension, Myocardial Infarction, Weeks Of Treatment: 0 History: Type II Diabetes, Osteoarthritis, Neuropathy Clustered Wound: No Photos Wound Measurements Length: (cm) 10 Width: (cm) 4.5 Depth: (cm) 0.7 Area: (cm) 35.343 Volume: (cm) 24.74 % Reduction in Area: 0% % Reduction in Volume: 0% Epithelialization: None Tunneling: No Undermining: No Wound Description Full Thickness Without Exposed Support Foul Odo Classification: Structures Slough/F Wound Margin: Thickened Exudate Medium Amount: Exudate Type: Serous Exudate Color: amber r After Cleansing: No ibrino Yes Wound Bed Granulation Amount: None Present (0%) Exposed Structure Necrotic Amount: Large (67-100%) Fat Layer (Subcutaneous Tissue) Exposed: Yes Necrotic Quality: Eschar, Adherent Slough Treatment Notes Wound #8 (Right Achilles) Notes BRONISLAUS, VERDELL ([id number]) Iodoflex on right achillies and left toe wounds, betadine to all other wounds Electronic Signature(s) Signed: 03/17/2019 5:20:48 PM By: Gretta Cool, BSN, RN, CWS, Kim RN, BSN Entered By: Gretta Cool, BSN, RN, CWS, Kim on 03/17/2019 13:24:00 Statler, Marca Ho ([id number]) -------------------------------------------------------------------------------- Wound Assessment Details Patient Name: KALAI, BACA. Date of Service: 03/17/2019 1:00 PM Medical Record Number: [id number] Patient Account Number: [id number] Date of Birth/Sex: [birth year] (86 y.o. M) Treating RN: Cornell Barman Primary Care Arvine Clayburn: Caryl Pina Other Clinician: Referring Ercel Normoyle: Caryl Pina Treating Casimer Russett/Extender: Melburn Hake, HOYT Weeks in  Treatment: 6 Wound Status Wound Number: 9 Primary Arterial Insufficiency Ulcer Etiology: Wound Location: Right Foot - Lateral Wound Open Wounding Event: Gradually Appeared Status: Date Acquired: 02/08/2019 Comorbid Cataracts, Hypertension, Myocardial Infarction, Weeks Of Treatment: 0 History: Type II Diabetes, Osteoarthritis, Neuropathy Clustered Wound: No Wound Measurements Length: (cm) 1.7 Width: (cm) 1.5 Depth: (cm) 0.1 Area: (cm) 2.003 Volume: (cm) 0.2 % Reduction in Area: % Reduction in Volume: Epithelialization: None Tunneling: No Undermining: No Wound Description Classification: Unclassifiable Wound Margin: Flat and Intact Exudate Amount: None Present Foul Odor After Cleansing: No Slough/Fibrino No Wound Bed Granulation Amount: None Present (0%) Exposed Structure Necrotic Amount: Large (67-100%) Fascia Exposed: No Necrotic Quality: Eschar Fat Layer (Subcutaneous Tissue) Exposed: No Tendon Exposed: No Muscle Exposed: No Joint Exposed: No Bone Exposed: No Treatment Notes Wound #9 (Right, Lateral Foot) Notes Iodoflex on right achillies and left toe wounds, betadine to all other wounds Electronic Signature(s) Signed: 03/17/2019 5:20:48 PM By: Gretta Cool, BSN, RN, CWS, Kim RN, BSN Entered By: Gretta Cool, BSN, RN, CWS, Kim on 03/17/2019 13:25:44 Niu, Marca Ho ([id number]) -------------------------------------------------------------------------------- Vitals Details Patient Name: Jacqualyn Posey. Date of Service: 03/17/2019 1:00 PM Medical Record Number: [id number] Patient Account Number: [id number] Date of Birth/Sex: May 21, 1930 (86 y.o. M) Treating RN: Cornell Barman Primary Care Derion Kreiter: Caryl Pina Other Clinician: Referring Zayed Griffie: Caryl Pina Treating Powell Halbert/Extender: Melburn Hake, [name]: 6 Vital Signs Time Taken: 13:20 Temperature (F): 97.7 Height (in): 68 Pulse (bpm): 86 Weight (lbs): 235 Respiratory Rate (breaths/min):  16 Body Mass Index (BMI): 35.7 Blood Pressure (mmHg): 140/74 Reference Range: 80 - 120 mg / dl Electronic Signature(s) Signed: 03/17/2019 5:20:48 PM By: Gretta Cool, BSN, RN, CWS, Kim RN, BSN Entered By: Gretta Cool, BSN, RN, CWS, Kim on 03/17/2019 13:30:58

## 2019-03-17 NOTE — Telephone Encounter (Signed)
Patient son [name]) has been made aware and also the patient son wanted to see if his father can have his COVID 53 test on today. I did inform the patient son that his father can be tested today but he will have to be quarantine until his procedure per testing site.

## 2019-03-18 ENCOUNTER — Other Ambulatory Visit: Payer: Medicare Other

## 2019-03-22 ENCOUNTER — Other Ambulatory Visit: Payer: Self-pay

## 2019-03-22 ENCOUNTER — Ambulatory Visit
Admission: RE | Admit: 2019-03-22 | Discharge: 2019-03-22 | Disposition: A | Discharge status: Home / Self Care | Payer: Medicare Other | Attending: Vascular Surgery | Admitting: Vascular Surgery

## 2019-03-22 ENCOUNTER — Other Ambulatory Visit (INDEPENDENT_AMBULATORY_CARE_PROVIDER_SITE_OTHER): Payer: Self-pay | Admitting: Nurse Practitioner

## 2019-03-22 ENCOUNTER — Ambulatory Visit: Payer: Medicare Other | Admitting: Internal Medicine

## 2019-03-22 ENCOUNTER — Encounter
Admission: RE | Disposition: A | Discharge status: Home / Self Care | Payer: Self-pay | Source: Home / Self Care | Attending: Vascular Surgery

## 2019-03-22 ENCOUNTER — Encounter: Payer: Self-pay | Admitting: Vascular Surgery

## 2019-03-22 DIAGNOSIS — L97519 Non-pressure chronic ulcer of other part of right foot with unspecified severity: Secondary | ICD-10-CM | POA: Insufficient documentation

## 2019-03-22 DIAGNOSIS — I129 Hypertensive chronic kidney disease with stage 1 through stage 4 chronic kidney disease, or unspecified chronic kidney disease: Secondary | ICD-10-CM | POA: Diagnosis not present

## 2019-03-22 DIAGNOSIS — E46 Unspecified protein-calorie malnutrition: Secondary | ICD-10-CM | POA: Insufficient documentation

## 2019-03-22 DIAGNOSIS — M1990 Unspecified osteoarthritis, unspecified site: Secondary | ICD-10-CM | POA: Diagnosis not present

## 2019-03-22 DIAGNOSIS — E1122 Type 2 diabetes mellitus with diabetic chronic kidney disease: Secondary | ICD-10-CM | POA: Diagnosis not present

## 2019-03-22 DIAGNOSIS — F419 Anxiety disorder, unspecified: Secondary | ICD-10-CM | POA: Diagnosis not present

## 2019-03-22 DIAGNOSIS — Z79899 Other long term (current) drug therapy: Secondary | ICD-10-CM | POA: Insufficient documentation

## 2019-03-22 DIAGNOSIS — I252 Old myocardial infarction: Secondary | ICD-10-CM | POA: Diagnosis not present

## 2019-03-22 DIAGNOSIS — F329 Major depressive disorder, single episode, unspecified: Secondary | ICD-10-CM | POA: Insufficient documentation

## 2019-03-22 DIAGNOSIS — I2510 Atherosclerotic heart disease of native coronary artery without angina pectoris: Secondary | ICD-10-CM | POA: Diagnosis not present

## 2019-03-22 DIAGNOSIS — I70299 Other atherosclerosis of native arteries of extremities, unspecified extremity: Secondary | ICD-10-CM

## 2019-03-22 DIAGNOSIS — Q059 Spina bifida, unspecified: Secondary | ICD-10-CM | POA: Diagnosis not present

## 2019-03-22 DIAGNOSIS — N189 Chronic kidney disease, unspecified: Secondary | ICD-10-CM | POA: Insufficient documentation

## 2019-03-22 DIAGNOSIS — I6529 Occlusion and stenosis of unspecified carotid artery: Secondary | ICD-10-CM | POA: Insufficient documentation

## 2019-03-22 DIAGNOSIS — I70249 Atherosclerosis of native arteries of left leg with ulceration of unspecified site: Secondary | ICD-10-CM | POA: Diagnosis not present

## 2019-03-22 DIAGNOSIS — L97529 Non-pressure chronic ulcer of other part of left foot with unspecified severity: Secondary | ICD-10-CM | POA: Diagnosis not present

## 2019-03-22 DIAGNOSIS — Z87891 Personal history of nicotine dependence: Secondary | ICD-10-CM | POA: Insufficient documentation

## 2019-03-22 DIAGNOSIS — I70235 Atherosclerosis of native arteries of right leg with ulceration of other part of foot: Secondary | ICD-10-CM | POA: Insufficient documentation

## 2019-03-22 DIAGNOSIS — I70245 Atherosclerosis of native arteries of left leg with ulceration of other part of foot: Secondary | ICD-10-CM | POA: Insufficient documentation

## 2019-03-22 DIAGNOSIS — I70239 Atherosclerosis of native arteries of right leg with ulceration of unspecified site: Secondary | ICD-10-CM

## 2019-03-22 DIAGNOSIS — E11621 Type 2 diabetes mellitus with foot ulcer: Secondary | ICD-10-CM | POA: Insufficient documentation

## 2019-03-22 DIAGNOSIS — Z7984 Long term (current) use of oral hypoglycemic drugs: Secondary | ICD-10-CM | POA: Diagnosis not present

## 2019-03-22 DIAGNOSIS — E785 Hyperlipidemia, unspecified: Secondary | ICD-10-CM | POA: Insufficient documentation

## 2019-03-22 DIAGNOSIS — I70223 Atherosclerosis of native arteries of extremities with rest pain, bilateral legs: Secondary | ICD-10-CM | POA: Diagnosis present

## 2019-03-22 DIAGNOSIS — F4310 Post-traumatic stress disorder, unspecified: Secondary | ICD-10-CM | POA: Diagnosis not present

## 2019-03-22 DIAGNOSIS — L97909 Non-pressure chronic ulcer of unspecified part of unspecified lower leg with unspecified severity: Secondary | ICD-10-CM

## 2019-03-22 DIAGNOSIS — E1151 Type 2 diabetes mellitus with diabetic peripheral angiopathy without gangrene: Secondary | ICD-10-CM | POA: Insufficient documentation

## 2019-03-22 HISTORY — PX: LOWER EXTREMITY ANGIOGRAPHY: CATH118251

## 2019-03-22 LAB — GLUCOSE, CAPILLARY
Glucose-Capillary: 149 mg/dL — ABNORMAL HIGH (ref 70–99)
Glucose-Capillary: 139 mg/dL — ABNORMAL HIGH (ref 70–99)

## 2019-03-22 LAB — BUN: BUN: 39 mg/dL — ABNORMAL HIGH (ref 8–23)

## 2019-03-22 LAB — CREATININE, SERUM
Creatinine, Ser: 1.79 mg/dL — ABNORMAL HIGH (ref 0.61–1.24)
GFR calc Af Amer: 39 mL/min — ABNORMAL LOW (ref >60)
GFR calc non Af Amer: 34 mL/min — ABNORMAL LOW (ref >60)

## 2019-03-22 SURGERY — LOWER EXTREMITY ANGIOGRAPHY
Anesthesia: Moderate Sedation | Site: Leg Lower | Laterality: Right

## 2019-03-22 MED ORDER — SODIUM CHLORIDE 0.9% FLUSH: 3.0000 mL | Freq: Two times a day (BID) | INTRAVENOUS | Status: DC

## 2019-03-22 MED ORDER — OXYCODONE HCL 5 MG PO TABS: 5.0000 mg | ORAL_TABLET | ORAL | Status: DC | PRN

## 2019-03-22 MED ORDER — IODIXANOL 320 MG/ML IV SOLN
INTRAVENOUS | Status: DC | PRN
  Administered 2019-03-22: 120 mL via INTRA_ARTERIAL

## 2019-03-22 MED ORDER — ACETAMINOPHEN 325 MG PO TABS: 650.0000 mg | ORAL_TABLET | ORAL | Status: DC | PRN

## 2019-03-22 MED ORDER — ONDANSETRON HCL 4 MG/2ML IJ SOLN: 4.0000 mg | Freq: Four times a day (QID) | INTRAMUSCULAR | Status: DC | PRN

## 2019-03-22 MED ORDER — CLOPIDOGREL BISULFATE 75 MG PO TABS: 75.0000 mg | ORAL_TABLET | Freq: Every day | ORAL | 4 refills | Status: AC

## 2019-03-22 MED ORDER — LABETALOL HCL 5 MG/ML IV SOLN: 10.0000 mg | INTRAVENOUS | Status: DC | PRN

## 2019-03-22 MED ORDER — CLINDAMYCIN PHOSPHATE 300 MG/50ML IV SOLN: 300.0000 mg | Freq: Once | INTRAVENOUS | Status: AC

## 2019-03-22 MED ORDER — MORPHINE SULFATE (PF) 4 MG/ML IV SOLN: 2.0000 mg | INTRAVENOUS | Status: DC | PRN

## 2019-03-22 MED ORDER — HEPARIN SODIUM (PORCINE) 1000 UNIT/ML IJ SOLN
INTRAMUSCULAR | Status: AC
  Filled 2019-03-22: qty 1

## 2019-03-22 MED ORDER — FENTANYL CITRATE (PF) 100 MCG/2ML IJ SOLN
INTRAMUSCULAR | Status: DC | PRN
  Administered 2019-03-22 (×2): 25 ug via INTRAVENOUS

## 2019-03-22 MED ORDER — CLINDAMYCIN PHOSPHATE 300 MG/50ML IV SOLN
INTRAVENOUS | Status: AC
  Administered 2019-03-22: 300 mg via INTRAVENOUS
  Filled 2019-03-22: qty 50

## 2019-03-22 MED ORDER — MIDAZOLAM HCL 5 MG/5ML IJ SOLN
INTRAMUSCULAR | Status: AC
  Filled 2019-03-22: qty 5

## 2019-03-22 MED ORDER — HYDRALAZINE HCL 20 MG/ML IJ SOLN: 5.0000 mg | INTRAMUSCULAR | Status: DC | PRN

## 2019-03-22 MED ORDER — DIPHENHYDRAMINE HCL 50 MG/ML IJ SOLN: 50.0000 mg | Freq: Once | INTRAMUSCULAR | Status: DC | PRN

## 2019-03-22 MED ORDER — MIDAZOLAM HCL 2 MG/ML PO SYRP: 8.0000 mg | ORAL_SOLUTION | Freq: Once | ORAL | Status: DC | PRN

## 2019-03-22 MED ORDER — FAMOTIDINE 20 MG PO TABS: 40.0000 mg | ORAL_TABLET | Freq: Once | ORAL | Status: DC | PRN

## 2019-03-22 MED ORDER — SODIUM CHLORIDE 0.9 % IV SOLN: 250.0000 mL | INTRAVENOUS | Status: DC | PRN

## 2019-03-22 MED ORDER — HEPARIN SODIUM (PORCINE) 1000 UNIT/ML IJ SOLN
INTRAMUSCULAR | Status: DC | PRN
  Administered 2019-03-22: 4000 [IU] via INTRAVENOUS

## 2019-03-22 MED ORDER — METHYLPREDNISOLONE SODIUM SUCC 125 MG IJ SOLR: 125.0000 mg | Freq: Once | INTRAMUSCULAR | Status: DC | PRN

## 2019-03-22 MED ORDER — SODIUM CHLORIDE 0.9% FLUSH: 3.0000 mL | INTRAVENOUS | Status: DC | PRN

## 2019-03-22 MED ORDER — MIDAZOLAM HCL 2 MG/2ML IJ SOLN
INTRAMUSCULAR | Status: DC | PRN
  Administered 2019-03-22 (×2): 1 mg via INTRAVENOUS

## 2019-03-22 MED ORDER — FENTANYL CITRATE (PF) 100 MCG/2ML IJ SOLN
INTRAMUSCULAR | Status: AC
  Filled 2019-03-22: qty 2

## 2019-03-22 MED ORDER — SODIUM CHLORIDE 0.9 % IV SOLN: INTRAVENOUS | Status: DC

## 2019-03-22 MED ORDER — HYDROMORPHONE HCL 1 MG/ML IJ SOLN: 1.0000 mg | Freq: Once | INTRAMUSCULAR | Status: DC | PRN

## 2019-03-22 SURGICAL SUPPLY — 33 items
ADPR CATH 9FR SLF ADJ SL SD (SHEATH) ×1
BALLN DORADO 7X20X80 (BALLOONS) ×2
BALLN ULTRSCOR 014 2.5X150X150 (BALLOONS) ×2
BALLN ULTRV 018 7X40X75 (BALLOONS) ×2
BALLN ULTRVRSE 2X40X150 (BALLOONS) ×2
BALLN ULTRVRSE 4X40X75C (BALLOONS) ×2
BALLOON DORADO 7X20X80 (BALLOONS) IMPLANT
BALLOON ULTRSC 014 2.5X150X150 (BALLOONS) IMPLANT
BALLOON ULTRV 018 7X40X75 (BALLOONS) IMPLANT
BALLOON ULTRVRSE 2X40X150 (BALLOONS) IMPLANT
BALLOON ULTRVRSE 4X40X75C (BALLOONS) IMPLANT
CATH BEACON 5 .035 65 KMP TIP (CATHETERS) ×1 IMPLANT
CATH PIG 70CM (CATHETERS) ×1 IMPLANT
CATH VERT 5FR 125CM (CATHETERS) ×1 IMPLANT
COVER PROBE U/S 5X48 (MISCELLANEOUS) ×1 IMPLANT
DEVICE PRESTO INFLATION (MISCELLANEOUS) ×1 IMPLANT
DEVICE STARCLOSE SE CLOSURE (Vascular Products) ×1 IMPLANT
GLIDEWIRE ADV .014X300CM (WIRE) ×1 IMPLANT
GLIDEWIRE ADV .035X260CM (WIRE) ×1 IMPLANT
NDL ENTRY 21GA 7CM ECHOTIP (NEEDLE) IMPLANT
NEEDLE ENTRY 21GA 7CM ECHOTIP (NEEDLE) ×2 IMPLANT
PACK ANGIOGRAPHY (CUSTOM PROCEDURE TRAY) ×2 IMPLANT
SET INTRO CAPELLA COAXIAL (SET/KITS/TRAYS/PACK) ×1 IMPLANT
SHEATH ANL2 6FRX45 HC (SHEATH) ×1 IMPLANT
SHEATH BRITE TIP 5FRX11 (SHEATH) ×1 IMPLANT
SHEATH FLEXOR ANSEL2 7FRX45 (SHEATH) ×1 IMPLANT
STENT VIABAHN 7X50X120 (Permanent Stent) ×2 IMPLANT
STENT VIABAHN 7X5X120 7FR (Permanent Stent) IMPLANT
SYR MEDRAD MARK 7 150ML (SYRINGE) ×1 IMPLANT
TUBING CONTRAST HIGH PRESS 72 (TUBING) ×1 IMPLANT
VALVE CHECKFLO PERFORMER (SHEATH) ×1 IMPLANT
WIRE G V18X300CM (WIRE) ×1 IMPLANT
WIRE J 3MM .035X145CM (WIRE) ×1 IMPLANT

## 2019-03-22 NOTE — H&P (Signed)
[facility] History & Physical Update  The patient was interviewed and re-examined.  The patient's previous History and Physical has been reviewed and is unchanged.  There is no change in the plan of care. We plan to proceed with the scheduled procedure.  Hortencia Pilar, MD  03/22/2019, 12:30 PM

## 2019-03-22 NOTE — Op Note (Signed)
[facility] Percutaneous Study/Intervention Procedural Note   Date of Surgery: 03/22/2019  Surgeon: Hortencia Pilar  Pre-operative Diagnosis: Atherosclerotic occlusive disease bilateral lower extremities with ulceration bilateral lower extremities  Post-operative diagnosis: Same  Procedure(s) Performed: 1. Introduction catheter into right lower extremity 3rd order catheter placement  2. Contrast injection right lower extremity for distal runoff with additional 3rd order  3. Percutaneous transluminal angioplasty to 2.5 mm right anterior tibial              4. Percutaneous transluminal angioplasty to 2.0 mm right posterior tibial  5. Percutaneous transluminal angioplasty and stent placement right SFA             6.  Star close closure left common femoral arteriotomy  Anesthesia: Conscious sedation was administered under my direct supervision by the interventional radiology RN. IV Versed plus fentanyl were utilized. Continuous ECG, pulse oximetry and blood pressure was monitored throughout the entire procedure.  Conscious sedation was for a total of 1 hour 17 minutes.  Sheath: 7 [place] Ansell left common femoral retrograde  Contrast: 120 cc  Fluoroscopy Time: 17.1 minutes  Indications: [name]. presents with increasing pain of both lower extremities.  He also has nonhealing ulcerations of both feet this suggests the patient is having limb threatening ischemia. The risks and benefits are reviewed all questions answered patient agrees to proceed.  Procedure:Edoardo Laforte. is a 86 y.o. y.o. male who was identified and appropriate procedural time out was performed. The patient was then placed supine on the table and prepped and draped in the usual sterile fashion.   Ultrasound was placed in the sterile sleeve and the left groin was evaluated the left common femoral artery was  echolucent and pulsatile indicating patency. Image was recorded for the permanent record and under real-time visualization a microneedle was inserted into the common femoral artery followed by the microwire and then the micro-sheath. A J-wire was then advanced through the micro-sheath and a 5 [place] sheath was then inserted over a J-wire. J-wire was then advanced and a 5 French pigtail catheter was positioned at the level of T12.  AP projection of the aorta was then obtained. Pigtail catheter was repositioned to above the bifurcation and a LAO view of the pelvis was obtained. Subsequently a pigtail catheter with the advantage Glidewire was used to cross the aortic bifurcation the catheter wire were advanced down into the right distal external iliac artery. Oblique view of the femoral bifurcation was then obtained and subsequently the wire was reintroduced and the wire was negotiated distal to the short segment occlusion of the SFA.    5000 units of heparin was then given and allowed to circulate.  A 4 mm x 40 mm ultra versed balloon was then advanced across the occlusion and inflated to 12 atm for approximately 1 minute.  Follow-up imaging demonstrated enough luminal gain that the pigtail catheter could then be advanced and positioned distal to this lesion in the SFA.  Distal runoff was then completed.  Diagnostic interpretation: The abdominal aorta is opacified with a bolus injection of contrast.  There is moderate atherosclerotic changes but no hemodynamically significant strictures or stenoses.  Bilateral single renal arteries are noted with normal nephrograms.  There is moderate stenosis of the left but it does not appear to be hemodynamically significant.  The right is widely patent.  The aortic bifurcation is widely patent as is the common and external iliac arteries bilaterally.  The right  common femoral profunda femoris are widely patent.  Superficial femoral artery is patent at its origin and  approximately 2 cm distal to the origin there is a short segment occlusion.  Beyond this occlusion there is diffuse calcifications but no hemodynamically significant strictures or stenoses.  The popliteal artery demonstrates moderate atherosclerotic changes but there are no hemodynamically significant strictures or focal stenoses.  The trifurcation is patent.  The anterior tibial artery demonstrates diffuse hemodynamically significant disease beginning several centimeters distal to the origin and extending down to the ankle.  There are multiple lesions that are greater than 90% within this diseased segment.  Distally it appears to be a fairly good artery and fills the dorsalis pedis and the pedal arch.  The posterior tibial demonstrates a greater than 60% stenosis at its origin.  And then there is a second focal greater than 90% stenosis down by the ankle.  Distal to that lesion it does fill the plantar arteries and contribute to the pedal arch.  The peroneal is patent throughout its course although diffusely diseased.  Based on these findings I elected to move forward with intervention as noted above heparin had already been given.  A 7 French Ansell sheath was advanced up and over the bifurcation and positioned in the right common femoral artery  0.035 advantage wire was then advanced through the pigtail catheter and attention was turned to the short segment occlusion of the superficial femoral artery.  A 7 mm x 50 mm Viabahn stent was deployed across this lesion initially is postdilated with a 6 mm balloon inflated to 18 atm and then a 7 mm x 20 mm Dorado balloon inflated to 24 atm for 30 seconds.  Follow-up imaging demonstrated less than 20% residual stenosis with smooth flow through the Viabahn stent.  Attention was then turned to the tibial disease.    Kumpe catheter and 0.014 advantage wire were then negotiated into the anterior tibial and all the way down to the dorsum of the foot.  A 2.5 mm x 150 mm  ultra score balloon was then advanced across the diseased segment 2 separate inflations were made each to 12 atm for approximately 1 minute.  Follow-up imaging by hand-injection demonstrated less than 10% residual stenosis throughout the entire length of the anterior tibial.  The Kumpe catheter was then reintroduced and the wire negotiated now into the posterior tibial where the wire catheter were advanced and magnified imaging of the distal posterior tibial lesion was performed.  A 2 mm x 40 mm Ultraverse balloon was then advanced across this lesion inflated to 16 atm for 1 minute.  The balloon was then repositioned at the ostia of the posterior tibial and inflated to 16 atm for 1 minute.  Follow-up imaging by hand-injection demonstrated less than 10% residual stenosis throughout the entire course of the posterior tibial.  After review of these images the sheath is pulled into the left external iliac oblique of the common femoral is obtained and a Star close device deployed. There no immediate Complications.  Findings:  The abdominal aorta is opacified with a bolus injection of contrast.  There is moderate atherosclerotic changes but no hemodynamically significant strictures or stenoses.  Bilateral single renal arteries are noted with normal nephrograms.  There is moderate stenosis of the left but it does not appear to be hemodynamically significant.  The right is widely patent.  The aortic bifurcation is widely patent as is the common and external iliac arteries bilaterally.  The right common  femoral profunda femoris are widely patent.  Superficial femoral artery is patent at its origin and approximately 2 cm distal to the origin there is a short segment occlusion.  Beyond this occlusion there is diffuse calcifications but no hemodynamically significant strictures or stenoses.  The popliteal artery demonstrates moderate atherosclerotic changes but there are no hemodynamically significant strictures or  focal stenoses.  The trifurcation is patent.  The anterior tibial artery demonstrates diffuse hemodynamically significant disease beginning several centimeters distal to the origin and extending down to the ankle.  There are multiple lesions that are greater than 90% within this diseased segment.  Distally it appears to be a fairly good artery and fills the dorsalis pedis and the pedal arch.  The posterior tibial demonstrates a greater than 60% stenosis at its origin.  And then there is a second focal greater than 90% stenosis down by the ankle.  Distal to that lesion it does fill the plantar arteries and contribute to the pedal arch.  The peroneal is patent throughout its course although diffusely diseased.  Following angioplasty the right anterior tibial now is now widely patent and demonstrates in-line flow with less than 10% residual stenosis.  Angioplasty of the right posterior tibial also yields an excellent result with less than 10% residual stenosis.  Angioplasty and stent placement of the right SFA occlusion yields an excellent result with less than 20% residual stenosis   Summary: Successful recanalization right lower extremity for limb salvage   Disposition: Patient was taken to the recovery room in stable condition having tolerated the procedure well.  Belenda Cruise Tirth Cothron 03/22/2019,2:16 PM

## 2019-03-23 ENCOUNTER — Encounter: Payer: Self-pay | Admitting: Cardiology

## 2019-03-24 ENCOUNTER — Telehealth (INDEPENDENT_AMBULATORY_CARE_PROVIDER_SITE_OTHER): Payer: Self-pay

## 2019-03-24 ENCOUNTER — Encounter: Payer: Medicare Other | Admitting: Physician Assistant

## 2019-03-24 ENCOUNTER — Other Ambulatory Visit: Payer: Self-pay

## 2019-03-24 ENCOUNTER — Telehealth: Payer: Self-pay

## 2019-03-24 ENCOUNTER — Other Ambulatory Visit
Admission: RE | Admit: 2019-03-24 | Discharge: 2019-03-24 | Disposition: A | Discharge status: Home / Self Care | Payer: Medicare Other | Source: Ambulatory Visit | Attending: Vascular Surgery | Admitting: Vascular Surgery

## 2019-03-24 DIAGNOSIS — Z01812 Encounter for preprocedural laboratory examination: Secondary | ICD-10-CM | POA: Insufficient documentation

## 2019-03-24 DIAGNOSIS — L89610 Pressure ulcer of right heel, unstageable: Secondary | ICD-10-CM | POA: Diagnosis not present

## 2019-03-24 DIAGNOSIS — Z20822 Contact with and (suspected) exposure to covid-19: Secondary | ICD-10-CM | POA: Diagnosis not present

## 2019-03-24 LAB — SARS CORONAVIRUS 2 (TAT 6-24 HRS): SARS Coronavirus 2: NEGATIVE

## 2019-03-24 NOTE — Telephone Encounter (Signed)
Patient's son called wanting to know if he could take the patient today and have his covid test because they will be in [place] at another appt. I attempted to explain the protocols of having the test on a certain time frame that I was given, but he felt that 24 hours shouldn't make a difference and tomorrow the patient has another appt and it would make for a long day.

## 2019-03-24 NOTE — Telephone Encounter (Signed)
COVID-19 Pre-Screening Questions:03/24/19  Do you currently have a fever (>100 F), chills or unexplained body aches? NO  Are you currently experiencing new cough, shortness of breath, sore throat, runny nose? NO .  Have you recently travelled outside the state of [place] in the last 14 days? NO .  Have you been in contact with someone that is currently pending confirmation of Covid19 testing or has been confirmed to have the Covid19 virus?  NO  **If the patient answers NO to ALL questions -  advise the patient to please call the clinic before coming to the office should any symptoms develop.     

## 2019-03-24 NOTE — Progress Notes (Signed)
Ch met with Pt and Pt's son at the wound care facility, after Pt's 12:45pm appointment. Ch visited with Pt and son about everything that is going on. Pt requested prayer for body to recover. Ch prayed with Pt.   03/24/19 1345  Clinical Encounter Type  Visited With Patient and family together  Visit Type Follow-up;Spiritual support;Social support  Referral From Other (Comment)  Consult/Referral To Chaplain  Spiritual Encounters  Spiritual Needs Prayer;Emotional  Stress Factors  Patient Stress Factors Health changes;Loss of control;Major life changes  Family Stress Factors Exhausted

## 2019-03-24 NOTE — Progress Notes (Addendum)
JDEN, WANT ([id number]) Visit Report for 03/24/2019 Chief Complaint Document Details Patient Name: [name], [name]. Date of Service: 03/24/2019 12:30 PM Medical Record Number: [id number] Patient Account Number: [id number] Date of Birth/Sex: [birth year] (86 y.o. M) Treating RN: Army Melia Primary Care Provider: Caryl Pina Other Clinician: Referring Provider: Caryl Pina Treating Provider/Extender: Melburn Hake, [name]: 7 Information Obtained from: Patient Chief Complaint Heel pressure ulcer and right 4th and 5th toe pressure ulcers along with multiple left toe ulcers Electronic Signature(s) Signed: 03/24/2019 4:53:57 PM By: Worthy Keeler PA-C Entered By: Worthy Keeler on 03/24/2019 16:53:56 [name], [name] ([id number]) -------------------------------------------------------------------------------- HPI Details Patient Name: [name], [name]. Date of Service: 03/24/2019 12:30 PM Medical Record Number: [id number] Patient Account Number: [id number] Date of Birth/Sex: [birth year] (86 y.o. M) Treating RN: Army Melia Primary Care Provider: Caryl Pina Other Clinician: Referring Provider: Caryl Pina Treating Provider/Extender: Melburn Hake, [name]: 7 History of Present Illness HPI Description: 02/03/2019 patient presents today for initial evaluation in our clinic concerning issues that he has been having with wounds on his right heel as well as the right fourth and fifth toes. The toe ulcers have been present for about 5 months. They are pretty much just dry and eschar covered at this point but appear to be stable. The heel ulcer has been present for about 2 months and is very soft the eschar is loosening and there appears to be purulent drainage as well noted at this point there is also erythema surrounding the wound bed. I am unsure if this is just inflammation or secondary to infection to be perfectly honest. With that being  said the patient has recently gotten Prevalon offloading boots and he is essentially nonambulatory at this point. He did have a coronary artery bypass graft x4 in 1989. Otherwise he tells me that he also has hypertension and urinary incontinence. Obviously that is not playing any role in his wounds currently based on what we are seeing today. 02/10/2019 on evaluation today patient appears to be doing quite well with regard to his wounds. He is not showing a lot of signs of improvement yet but he still has a lot of eschar at this point. He still awaiting the vascular evaluation at this time as well. Fortunately there is no signs of active infection. No fevers, chills, nausea, vomiting, or diarrhea. 02/17/2019 on evaluation today patient appears to be doing quite well with regard to his wound. This seems to be greatly improved compared to last visit I do believe that the cefdinir is doing well for him. Fortunately there is no signs of active infection at this time. No fever chills noted no fevers, chills, nausea, vomiting, or diarrhea. 03/03/2019 on evaluation today patient appears to be doing unfortunately worse even compared to his last visit. His wounds have not progressed in a good way and unfortunately he has multiple new locations on the left foot as well that are giving him trouble. I am obviously concerned about how things are progressing at this point. I did actually have results to review for him today. This included his arterial study which revealed that his ABIs were not obtainable and subsequently his TBI's were very low in the 0.35-0.30 range bilaterally. Subsequently his x-ray also revealed that there are potential changes on the x-ray at the heel on the right that could be associated with osteomyelitis. Nonetheless I do believe that the patient does get a likely require a vascular consult as well as  an MRI to further evaluate the situation. In the meantime I am also going to recommend  refilling the Omnicef to continue treatment as such since that does seem to have been at least somewhat beneficial for him. 03/10/2019 upon evaluation today patient appears to be doing unfortunately a little bit worse in regard to new areas of what appear to be necrotic tissue showing up at multiple places on his lower extremity. I am very concerned about this to be honest. In fact he has an appointment next Thursday with vascular for evaluation. I really want to see if we try to get this moved up sooner I discussed this with the patient he is in agreement with that. He is also going for his MRI today once he leaves the clinic here. Nonetheless I am concerned about his arterial status based on what we are seeing currently. 03/17/2019 on evaluation today patient appears to be doing poorly at this point in regard to his bilateral lower extremities. Unfortunately he is having new wounds show up each time I see him we have been seeing him on a weekly schedule. Subsequently he does have his arterial intervention for the right lower extremity scheduled for this coming Monday which is the 25th. Subsequently he has a second procedure actually scheduled for 2 February which is a Tuesday of the following week. He also is scheduled to see infectious disease on January 29 which is next Friday. All this is in place and good news. With that being said he has not heard anything from [facility] at this point as far as an appointment with Dr. Amalia Hailey in order to see about evaluation with regard to the surgical intervention and debridement. Obviously I think if he has a chance that maintaining and preventing amputation it is good to be with reestablishing good blood flow, controlling the infection that is currently present, and subsequently seeing Dr. Amalia Hailey for surgical debridement of the Achilles, bursa, and calcaneus region. Obviously good wound care plays a role in this but I do not believe that alone is good to be  enough for this patient. I also think hyperbarics could play a role but the other key factors need to be in place first before we can even consider hyperbarics. 03/24/2019 upon evaluation today patient appears to be doing roughly the same in regard to his wounds. Unfortunately he still has several areas that are just small eschar regions they are very superficial nonetheless they obviously appear to be arterial HURSHEL, BOUILLON. ([id number]) in nature. He has undergone vascular intervention in regard to his right lower extremity that was this past Monday with the vascular surgeon. He did have a stent placed in the upper portion of the arterial flow system and then subsequently 3 areas that he had balloon angioplasty distal to this. He states the surgeon was extremely happy when he told him how the procedure went with the improved flow in fact the patient's color in his foot seems to be doing significantly better in my opinion. He also tells me it feels better and is not cold all the time like it used to be as far as his perception is concerned. He actually has his procedure for the left lower extremity scheduled for this coming Tuesday. Unfortunately he still has not heard anything from podiatry. Apparently they did get a call but unfortunately when his son called back he was not able to get in touch with anyone. I did give him the information to get in touch  with him today if they will get in touch by tomorrow morning to give me a call and let me know so we can see what is going on. I know that [facility] has reached out to him trying to schedule an appointment unfortunately just sounds like there is been some phone tag going on. Electronic Signature(s) Signed: 03/24/2019 4:54:37 PM By: Worthy Keeler PA-C Entered By: Worthy Keeler on 03/24/2019 16:54:36 Basil, [name] ([id number]) -------------------------------------------------------------------------------- Physical Exam  Details Patient Name: [name], [name]. Date of Service: 03/24/2019 12:30 PM Medical Record Number: [id number] Patient Account Number: [id number] Date of Birth/Sex: [birth year] (86 y.o. M) Treating RN: Army Melia Primary Care Provider: Caryl Pina Other Clinician: Referring Provider: Caryl Pina Treating Provider/Extender: STONE III, [name]: 7 Constitutional Well-nourished and well-hydrated in no acute distress. Respiratory normal breathing without difficulty. Psychiatric this patient is able to make decisions and demonstrates good insight into disease process. Alert and Oriented x 3. pleasant and cooperative. Notes Patient's wounds currently again appear to be rather stable for the most part I do not see any signs of the infection worsening he still has a significant amount of necrotic tissue in the Achilles region on the right. This is what I referred him to podiatry for as far as trying to clear away some of the necrotic tissue and allow for more appropriate healing. I also have referred him to infectious disease he will be seeing them tomorrow. I am hoping they will initiate IV antibiotic therapy due to the extensive nature of the infection in the Achilles area. Electronic Signature(s) Signed: 03/24/2019 4:55:09 PM By: Worthy Keeler PA-C Entered By: Worthy Keeler on 03/24/2019 16:55:09 [place], [name] ([id number]) -------------------------------------------------------------------------------- Physician Orders Details Patient Name: [name], [name]. Date of Service: 03/24/2019 12:30 PM Medical Record Number: [id number] Patient Account Number: [id number] Date of Birth/Sex: 11/03/1930 (86 y.o. M) Treating RN: Army Melia Primary Care Provider: Caryl Pina Other Clinician: Referring Provider: Caryl Pina Treating Provider/Extender: Melburn Hake, [name]: 7 Verbal / Phone Orders: No Diagnosis Coding Wound Cleansing Wound  #1 Right Calcaneus o Clean wound with Normal Saline. - in office o Cleanse wound with mild soap and water Primary Wound Dressing Wound #1 Right Calcaneus o Other: - betadine Wound #10 Right,Dorsal Foot o Other: - betadine Wound #11 Left Calcaneus o Other: - betadine Wound #12 Left Metatarsal head fifth o Other: - betadine Wound #13 Right,Plantar Foot o Other: - betadine Wound #14 Right,Lateral Malleolus o Other: - betadine Wound #2 Right Toe Fourth o Other: - betadine Wound #3 Right Toe Fifth o Other: - betadine Wound #9 Right,Lateral Foot o Other: - betadine Wound #4 Left,Lateral Toe Third o Silver Alginate Wound #5 Left,Medial Toe Fourth o Silver Alginate Wound #6 Left,Lateral Toe Fourth o Silver Alginate Wound #7 Left,Medial Toe Fifth o Silver Alginate Wound #8 Right Achilles [name], [name] ([id number]) o Iodoflex Secondary Dressing Wound #1 Right Calcaneus o Dry Gauze o Conform/Kerlix o Other - heel cup Wound #10 Right,Dorsal Foot o Dry Gauze o Conform/Kerlix o Other - heel cup Wound #11 Left Calcaneus o Dry Gauze o Conform/Kerlix o Other - heel cup Wound #12 Left Metatarsal head fifth o Dry Gauze o Conform/Kerlix o Other - heel cup Wound #13 Right,Plantar Foot o Dry Gauze o Conform/Kerlix o Other - heel cup Wound #14 Right,Lateral Malleolus o Dry Gauze o Conform/Kerlix o Other - heel cup Wound #2 Right Toe Fourth o Dry Gauze o Conform/Kerlix   o Other - heel cup Wound #3 Right Toe Fifth o Dry Gauze o Conform/Kerlix o Other - heel cup Wound #4 Left,Lateral Toe Third o Dry Gauze o Conform/Kerlix o Other - heel cup Wound #5 Left,Medial Toe Fourth o Dry Gauze o Conform/Kerlix o Other - heel cup Wound #6 Left,Lateral Toe Fourth o Dry Gauze o Conform/Kerlix o Other - heel cup [name], [name] ([id number]) Wound #7 Left,Medial Toe Fifth o  Dry Gauze o Conform/Kerlix o Other - heel cup Wound #8 Right Achilles o Dry Gauze o Conform/Kerlix o Other - heel cup Wound #9 Right,Lateral Foot o Dry Gauze o Conform/Kerlix o Other - heel cup Dressing Change Frequency Wound #1 Right Calcaneus o Change Dressing Tuesday and Thursday. - Tuesday by [facility] Wound #10 Right,Dorsal Foot o Change Dressing Tuesday and Thursday. - Tuesday by [facility] Wound #11 Left Calcaneus o Change Dressing Tuesday and Thursday. - Tuesday by [facility] Wound #12 Left Metatarsal head fifth o Change Dressing Tuesday and Thursday. - Tuesday by [facility] Wound #13 Right,Plantar Foot o Change Dressing Tuesday and Thursday. - Tuesday by [facility] Wound #14 Right,Lateral Malleolus o Change Dressing Tuesday and Thursday. - Tuesday by [facility] Wound #2 Right Toe Fourth o Change Dressing Tuesday and Thursday. - Tuesday by [facility] Wound #3 Right Toe Fifth o Change Dressing Tuesday and Thursday. - Tuesday by [facility] Wound #4 Left,Lateral Toe Third o Change Dressing Tuesday and Thursday. - Tuesday by [facility] Wound #5 Left,Medial Toe Fourth o Change Dressing Tuesday and Thursday. - Tuesday by [facility] Wound #6 Left,Lateral Toe Fourth o Change Dressing Tuesday and Thursday. - Tuesday by [facility] Wound #7 Left,Medial Toe Fifth o Change Dressing Tuesday and Thursday. - Tuesday by [facility] Wound #8 Right Achilles o Change Dressing Tuesday and Thursday. - Tuesday by [facility] Wound #9 Right,Lateral Foot ERCOLE, GEORG ([id number]) o Change Dressing Tuesday and Thursday. - Tuesday by [facility] Follow-up Appointments Wound #1 Right Calcaneus o Return Appointment in 1 week. Wound #10 Right,Dorsal Foot o Return Appointment in 1 week. Wound #11 Left Calcaneus o Return Appointment in 1 week. Wound #12 Left Metatarsal head fifth o Return Appointment in 1 week. Wound #13 Right,Plantar Foot o Return Appointment in 1 week. Wound #14 Right,Lateral Malleolus o Return Appointment in 1  week. Wound #2 Right Toe Fourth o Return Appointment in 1 week. Wound #3 Right Toe Fifth o Return Appointment in 1 week. Wound #4 Left,Lateral Toe Third o Return Appointment in 1 week. Wound #5 Left,Medial Toe Fourth o Return Appointment in 1 week. Wound #6 Left,Lateral Toe Fourth o Return Appointment in 1 week. Wound #7 Left,Medial Toe Fifth o Return Appointment in 1 week. Wound #8 Right Achilles o Return Appointment in 1 week. Wound #9 Right,Lateral Foot o Return Appointment in 1 week. Home Health Wound #1 Right [place] [facility] - [place] Nurse may visit PRN to address patientos wound care needs. o FACE TO FACE ENCOUNTER: MEDICARE and MEDICAID PATIENTS: I certify that this patient is under my care and that I had a face-to-face encounter that meets the physician face-to-face encounter requirements with this patient on this date. The encounter with the patient was in whole or in part for the following MEDICAL CONDITION: (primary reason for [place]) MEDICAL NECESSITY: I certify, that based on my findings, NURSING services are a medically necessary home health service. HOME BOUND STATUS: I certify that my clinical findings support that this patient is homebound (i.e., Due to illness or injury, pt  requires aid of supportive devices such as crutches, cane, wheelchairs, walkers, the use of special transportation or the assistance of another person to leave their place of residence. There is a normal inability to leave the home [name], [name]. ([id number]) and doing so requires considerable and taxing effort. Other absences are for medical reasons / religious services and are infrequent or of short duration when for other reasons). o If current dressing causes regression in wound condition, may D/C ordered dressing product/s and apply Normal Saline Moist Dressing daily until next [place] / Other MD  appointment. [place] of regression in wound condition at [phone]. o Please direct any NON-WOUND related issues/requests for orders to patient's Primary Care Physician Wound #10 [place] Visits - [place] Nurse may visit PRN to address patientos wound care needs. o FACE TO FACE ENCOUNTER: MEDICARE and MEDICAID PATIENTS: I certify that this patient is under my care and that I had a face-to-face encounter that meets the physician face-to-face encounter requirements with this patient on this date. The encounter with the patient was in whole or in part for the following MEDICAL CONDITION: (primary reason for [place]) MEDICAL NECESSITY: I certify, that based on my findings, NURSING services are a medically necessary home health service. HOME BOUND STATUS: I certify that my clinical findings support that this patient is homebound (i.e., Due to illness or injury, pt requires aid of supportive devices such as crutches, cane, wheelchairs, walkers, the use of special transportation or the assistance of another person to leave their place of residence. There is a normal inability to leave the home and doing so requires considerable and taxing effort. Other absences are for medical reasons / religious services and are infrequent or of short duration when for other reasons). o If current dressing causes regression in wound condition, may D/C ordered dressing product/s and apply Normal Saline Moist Dressing daily until next [place] / Other MD appointment. [place] of regression in wound condition at [phone]. o Please direct any NON-WOUND related issues/requests for orders to patient's Primary Care Physician Wound #11 Left Calcaneus o [place] Visits - [place] Nurse may visit PRN to address patientos wound care needs. o FACE TO FACE ENCOUNTER:  MEDICARE and MEDICAID PATIENTS: I certify that this patient is under my care and that I had a face-to-face encounter that meets the physician face-to-face encounter requirements with this patient on this date. The encounter with the patient was in whole or in part for the following MEDICAL CONDITION: (primary reason for [place]) MEDICAL NECESSITY: I certify, that based on my findings, NURSING services are a medically necessary home health service. HOME BOUND STATUS: I certify that my clinical findings support that this patient is homebound (i.e., Due to illness or injury, pt requires aid of supportive devices such as crutches, cane, wheelchairs, walkers, the use of special transportation or the assistance of another person to leave their place of residence. There is a normal inability to leave the home and doing so requires considerable and taxing effort. Other absences are for medical reasons / religious services and are infrequent or of short duration when for other reasons). o If current dressing causes regression in wound condition, may D/C ordered dressing product/s and apply Normal Saline Moist Dressing daily until next [place] / Other MD appointment. [place] of regression in wound condition at [phone]. o Please  direct any NON-WOUND related issues/requests for orders to patient's Primary Care Physician Wound #12 Left Metatarsal head fifth o [place] Visits - [place] Nurse may visit PRN to address patientos wound care needs. o FACE TO FACE ENCOUNTER: MEDICARE and MEDICAID PATIENTS: I certify that this patient is under my care and that I had a face-to-face encounter that meets the physician face-to-face encounter requirements with this patient on this date. The encounter with the patient was in whole or in part for the following MEDICAL CONDITION: (primary reason for [place]) MEDICAL NECESSITY: I  certify, that based on my findings, NURSING services are a medically necessary home health service. HOME BOUND STATUS: I certify that my clinical findings support that this patient is homebound (i.e., Due to illness or injury, pt requires aid of supportive devices such as crutches, cane, wheelchairs, walkers, the use of special transportation or the assistance of another person to leave their place of residence. There is a normal inability to leave the home and doing so requires considerable and taxing effort. Other absences are for medical reasons / religious services and are infrequent or of short duration when for other reasons). o If current dressing causes regression in wound condition, may D/C ordered dressing product/s and apply Normal Saline Moist Dressing daily until next [place] / Other MD appointment. [place] of regression in wound condition at [phone]. NEYMAR, DOWE ([id number]) o Please direct any NON-WOUND related issues/requests for orders to patient's Primary Care Physician Wound #13 [place] Visits - [place] Nurse may visit PRN to address patientos wound care needs. o FACE TO FACE ENCOUNTER: MEDICARE and MEDICAID PATIENTS: I certify that this patient is under my care and that I had a face-to-face encounter that meets the physician face-to-face encounter requirements with this patient on this date. The encounter with the patient was in whole or in part for the following MEDICAL CONDITION: (primary reason for [place]) MEDICAL NECESSITY: I certify, that based on my findings, NURSING services are a medically necessary home health service. HOME BOUND STATUS: I certify that my clinical findings support that this patient is homebound (i.e., Due to illness or injury, pt requires aid of supportive devices such as crutches, cane, wheelchairs, walkers, the use of special  transportation or the assistance of another person to leave their place of residence. There is a normal inability to leave the home and doing so requires considerable and taxing effort. Other absences are for medical reasons / religious services and are infrequent or of short duration when for other reasons). o If current dressing causes regression in wound condition, may D/C ordered dressing product/s and apply Normal Saline Moist Dressing daily until next [place] / Other MD appointment. [place] of regression in wound condition at [phone]. o Please direct any NON-WOUND related issues/requests for orders to patient's Primary Care Physician Wound #14 [place] Vis[facility] - [place] Nurse may visit PRN to address patientos wound care needs. o FACE TO FACE ENCOUNTER: MEDICARE and MEDICAID PATIENTS: I certify that this patient is under my care and that I had a face-to-face encounter that meets the physician face-to-face encounter requirements with this patient on this date. The encounter with the patient was in whole or in part for the following MEDICAL CONDITION: (primary reason for [place]) MEDICAL NECESSITY: I certify, that based on my findings, NURSING services  are a medically necessary home health service. HOME BOUND STATUS: I certify that my clinical findings support that this patient is homebound (i.e., Due to illness or injury, pt requires aid of supportive devices such as crutches, cane, wheelchairs, walkers, the use of special transportation or the assistance of another person to leave their place of residence. There is a normal inability to leave the home and doing so requires considerable and taxing effort. Other absences are for medical reasons / religious services and are infrequent or of short duration when for other reasons). o If current dressing causes regression in wound  condition, may D/C ordered dressing product/s and apply Normal Saline Moist Dressing daily until next [place] / Other MD appointment. [place] of regression in wound condition at [phone]. o Please direct any NON-WOUND related issues/requests for orders to patient's Primary Care Physician Wound #2 Right Toe [place] [facility] - [place] Nurse may visit PRN to address patientos wound care needs. o FACE TO FACE ENCOUNTER: MEDICARE and MEDICAID PATIENTS: I certify that this patient is under my care and that I had a face-to-face encounter that meets the physician face-to-face encounter requirements with this patient on this date. The encounter with the patient was in whole or in part for the following MEDICAL CONDITION: (primary reason for [place]) MEDICAL NECESSITY: I certify, that based on my findings, NURSING services are a medically necessary home health service. HOME BOUND STATUS: I certify that my clinical findings support that this patient is homebound (i.e., Due to illness or injury, pt requires aid of supportive devices such as crutches, cane, wheelchairs, walkers, the use of special transportation or the assistance of another person to leave their place of residence. There is a normal inability to leave the home and doing so requires considerable and taxing effort. Other absences are for medical reasons / religious services and are infrequent or of short duration when for other reasons). o If current dressing causes regression in wound condition, may D/C ordered dressing product/s and apply Normal Saline Moist Dressing daily until next [place] / Other MD appointment. [place] of regression in wound condition at [phone]. o Please direct any NON-WOUND related issues/requests for orders to patient's Primary Care Physician Wound #3 Right Toe [place] Visits - RONY, RATZ ([id number]) o [place] Nurse may visit PRN to address patientos wound care needs. o FACE TO FACE ENCOUNTER: MEDICARE and MEDICAID PATIENTS: I certify that this patient is under my care and that I had a face-to-face encounter that meets the physician face-to-face encounter requirements with this patient on this date. The encounter with the patient was in whole or in part for the following MEDICAL CONDITION: (primary reason for [place]) MEDICAL NECESSITY: I certify, that based on my findings, NURSING services are a medically necessary home health service. HOME BOUND STATUS: I certify that my clinical findings support that this patient is homebound (i.e., Due to illness or injury, pt requires aid of supportive devices such as crutches, cane, wheelchairs, walkers, the use of special transportation or the assistance of another person to leave their place of residence. There is a normal inability to leave the home and doing so requires considerable and taxing effort. Other absences are for medical reasons / religious services and are infrequent or of short duration when for other reasons). o If current dressing causes regression in wound condition, may D/C ordered  dressing product/s and apply Normal Saline Moist Dressing daily until next [place] / Other MD appointment. [place] of regression in wound condition at [phone]. o Please direct any NON-WOUND related issues/requests for orders to patient's Primary Care Physician Wound #4 Left,Lateral Toe [place] Visits - [place] Nurse may visit PRN to address patientos wound care needs. o FACE TO FACE ENCOUNTER: MEDICARE and MEDICAID PATIENTS: I certify that this patient is under my care and that I had a face-to-face encounter that meets the physician face-to-face encounter requirements with this patient on this date.  The encounter with the patient was in whole or in part for the following MEDICAL CONDITION: (primary reason for [place]) MEDICAL NECESSITY: I certify, that based on my findings, NURSING services are a medically necessary home health service. HOME BOUND STATUS: I certify that my clinical findings support that this patient is homebound (i.e., Due to illness or injury, pt requires aid of supportive devices such as crutches, cane, wheelchairs, walkers, the use of special transportation or the assistance of another person to leave their place of residence. There is a normal inability to leave the home and doing so requires considerable and taxing effort. Other absences are for medical reasons / religious services and are infrequent or of short duration when for other reasons). o If current dressing causes regression in wound condition, may D/C ordered dressing product/s and apply Normal Saline Moist Dressing daily until next [place] / Other MD appointment. [place] of regression in wound condition at [phone]. o Please direct any NON-WOUND related issues/requests for orders to patient's Primary Care Physician Wound #5 Left,Medial Toe [place] Visits - [place] Nurse may visit PRN to address patientos wound care needs. o FACE TO FACE ENCOUNTER: MEDICARE and MEDICAID PATIENTS: I certify that this patient is under my care and that I had a face-to-face encounter that meets the physician face-to-face encounter requirements with this patient on this date. The encounter with the patient was in whole or in part for the following MEDICAL CONDITION: (primary reason for [place]) MEDICAL NECESSITY: I certify, that based on my findings, NURSING services are a medically necessary home health service. HOME BOUND STATUS: I certify that my clinical findings support that this patient is homebound (i.e., Due to illness or  injury, pt requires aid of supportive devices such as crutches, cane, wheelchairs, walkers, the use of special transportation or the assistance of another person to leave their place of residence. There is a normal inability to leave the home and doing so requires considerable and taxing effort. Other absences are for medical reasons / religious services and are infrequent or of short duration when for other reasons). o If current dressing causes regression in wound condition, may D/C ordered dressing product/s and apply Normal Saline Moist Dressing daily until next [place] / Other MD appointment. [place] of regression in wound condition at [phone]. o Please direct any NON-WOUND related issues/requests for orders to patient's Primary Care Physician Wound #6 Left,Lateral Toe [place] Visits - [place] Nurse may visit PRN to address patientos wound care needs. o FACE TO FACE ENCOUNTER: MEDICARE and MEDICAID PATIENTS: I certify that this patient is under my care and that I had a face-to-face encounter that meets the physician face-to-face encounter requirements with this patient on this date. The encounter with the  patient was in whole or in part for the following MEDICAL CONDITION: (primary reason for [place]) MEDICAL NECESSITY: I certify, that based on my findings, SAMARTH, OGLE ([id number]) NURSING services are a medically necessary home health service. HOME BOUND STATUS: I certify that my clinical findings support that this patient is homebound (i.e., Due to illness or injury, pt requires aid of supportive devices such as crutches, cane, wheelchairs, walkers, the use of special transportation or the assistance of another person to leave their place of residence. There is a normal inability to leave the home and doing so requires considerable and taxing effort. Other absences are for medical reasons /  religious services and are infrequent or of short duration when for other reasons). o If current dressing causes regression in wound condition, may D/C ordered dressing product/s and apply Normal Saline Moist Dressing daily until next [place] / Other MD appointment. [place] of regression in wound condition at [phone]. o Please direct any NON-WOUND related issues/requests for orders to patient's Primary Care Physician Wound #7 Left,Medial Toe [place] Visits - [place] Nurse may visit PRN to address patientos wound care needs. o FACE TO FACE ENCOUNTER: MEDICARE and MEDICAID PATIENTS: I certify that this patient is under my care and that I had a face-to-face encounter that meets the physician face-to-face encounter requirements with this patient on this date. The encounter with the patient was in whole or in part for the following MEDICAL CONDITION: (primary reason for [place]) MEDICAL NECESSITY: I certify, that based on my findings, NURSING services are a medically necessary home health service. HOME BOUND STATUS: I certify that my clinical findings support that this patient is homebound (i.e., Due to illness or injury, pt requires aid of supportive devices such as crutches, cane, wheelchairs, walkers, the use of special transportation or the assistance of another person to leave their place of residence. There is a normal inability to leave the home and doing so requires considerable and taxing effort. Other absences are for medical reasons / religious services and are infrequent or of short duration when for other reasons). o If current dressing causes regression in wound condition, may D/C ordered dressing product/s and apply Normal Saline Moist Dressing daily until next [place] / Other MD appointment. [place] of regression in wound condition at [phone]. o Please  direct any NON-WOUND related issues/requests for orders to patient's Primary Care Physician Wound #8 Right Achilles o [place] Visits - [place] Nurse may visit PRN to address patientos wound care needs. o FACE TO FACE ENCOUNTER: MEDICARE and MEDICAID PATIENTS: I certify that this patient is under my care and that I had a face-to-face encounter that meets the physician face-to-face encounter requirements with this patient on this date. The encounter with the patient was in whole or in part for the following MEDICAL CONDITION: (primary reason for [place]) MEDICAL NECESSITY: I certify, that based on my findings, NURSING services are a medically necessary home health service. HOME BOUND STATUS: I certify that my clinical findings support that this patient is homebound (i.e., Due to illness or injury, pt requires aid of supportive devices such as crutches, cane, wheelchairs, walkers, the use of special transportation or the assistance of another person to leave their place of residence. There is a normal inability to leave the home and doing so requires considerable and taxing effort. Other absences are for medical  reasons / religious services and are infrequent or of short duration when for other reasons). o If current dressing causes regression in wound condition, may D/C ordered dressing product/s and apply Normal Saline Moist Dressing daily until next [place] / Other MD appointment. [place] of regression in wound condition at [phone]. o Please direct any NON-WOUND related issues/requests for orders to patient's Primary Care Physician Wound #9 [place] Visits - [place] Nurse may visit PRN to address patientos wound care needs. o FACE TO FACE ENCOUNTER: MEDICARE and MEDICAID PATIENTS: I certify that this patient is under my care and that I had a face-to-face  encounter that meets the physician face-to-face encounter requirements with this patient on this date. The encounter with the patient was in whole or in part for the following MEDICAL CONDITION: (primary reason for [place]) MEDICAL NECESSITY: I certify, that based on my findings, NURSING services are a medically necessary home health service. HOME BOUND STATUS: I certify that my clinical findings support that this patient is homebound (i.e., Due to illness or injury, pt requires aid of supportive devices such as crutches, cane, wheelchairs, walkers, the use of special transportation or the assistance of another person to leave their place of residence. There is a normal inability to leave the home QUILLAN, WHITTER. ([id number]) and doing so requires considerable and taxing effort. Other absences are for medical reasons / religious services and are infrequent or of short duration when for other reasons). o If current dressing causes regression in wound condition, may D/C ordered dressing product/s and apply Normal Saline Moist Dressing daily until next [place] / Other MD appointment. [place] of regression in wound condition at [phone]. o Please direct any NON-WOUND related issues/requests for orders to patient's Primary Care Physician Electronic Signature(s) Signed: 03/24/2019 3:29:42 PM By: Army Melia Signed: 03/24/2019 5:03:37 PM By: Worthy Keeler PA-C Entered By: Army Melia on 03/24/2019 13:40:50 Gro[place], [name] ([id number]) -------------------------------------------------------------------------------- Problem List Details Patient Name: BENJERMAN, MOLINELLI. Date of Service: 03/24/2019 12:30 PM Medical Record Number: [id number] Patient Account Number: [id number] Date of Birth/Sex: [birth year] (86 y.o. M) Treating RN: Army Melia Primary Care Provider: Caryl Pina Other Clinician: Referring Provider: Caryl Pina Treating  Provider/Extender: Melburn Hake, [name]: 7 Active Problems ICD-10 Evaluated Encounter Code Description Active Date Today Diagnosis L89.610 Pressure ulcer of right heel, unstageable 02/03/2019 No Yes L89.890 Pressure ulcer of other site, unstageable 02/03/2019 No Yes E11.622 Type 2 diabetes mellitus with other skin ulcer 03/03/2019 No Yes L97.522 Non-pressure chronic ulcer of other part of left foot with fat 03/03/2019 No Yes layer exposed L97.512 Non-pressure chronic ulcer of other part of right foot with fat 03/03/2019 No Yes layer exposed I73.89 Other specified peripheral vascular diseases 03/03/2019 No Yes I25.10 Atherosclerotic heart disease of native coronary artery 02/03/2019 No Yes without angina pectoris I10 Essential (primary) hypertension 02/03/2019 No Yes N39.498 Other specified urinary incontinence 02/03/2019 No Yes Inactive Problems [name], [name] ([id number]) Resolved Problems Electronic Signature(s) Signed: 03/24/2019 1:38:14 PM By: Worthy Keeler PA-C Entered By: Worthy Keeler on 03/24/2019 13:38:14 [place], [name] ([id number]) -------------------------------------------------------------------------------- Progress Note Details Patient Name: [name]. Date of Service: 03/24/2019 12:30 PM Medical Record Number: [id number] Patient Account Number: [id number] Date of Birth/Sex: [birth year] (86 y.o. M) Treating RN: Army Melia Primary Care Provider: Caryl Pina Other Clinician: Referring Provider: Caryl Pina Treating Provider/Extender: STONE III, HOYT Weeks in  Treatment: 7 Subjective Chief Complaint Information obtained from Patient Heel pressure ulcer and right 4th and 5th toe pressure ulcers along with multiple left toe ulcers History of Present Illness (HPI) 02/03/2019 patient presents today for initial evaluation in our clinic concerning issues that he has been having with wounds on his right heel as well as the right fourth and  fifth toes. The toe ulcers have been present for about 5 months. They are pretty much just dry and eschar covered at this point but appear to be stable. The heel ulcer has been present for about 2 months and is very soft the eschar is loosening and there appears to be purulent drainage as well noted at this point there is also erythema surrounding the wound bed. I am unsure if this is just inflammation or secondary to infection to be perfectly honest. With that being said the patient has recently gotten Prevalon offloading boots and he is essentially nonambulatory at this point. He did have a coronary artery bypass graft x4 in 1989. Otherwise he tells me that he also has hypertension and urinary incontinence. Obviously that is not playing any role in his wounds currently based on what we are seeing today. 02/10/2019 on evaluation today patient appears to be doing quite well with regard to his wounds. He is not showing a lot of signs of improvement yet but he still has a lot of eschar at this point. He still awaiting the vascular evaluation at this time as well. Fortunately there is no signs of active infection. No fevers, chills, nausea, vomiting, or diarrhea. 02/17/2019 on evaluation today patient appears to be doing quite well with regard to his wound. This seems to be greatly improved compared to last visit I do believe that the cefdinir is doing well for him. Fortunately there is no signs of active infection at this time. No fever chills noted no fevers, chills, nausea, vomiting, or diarrhea. 03/03/2019 on evaluation today patient appears to be doing unfortunately worse even compared to his last visit. His wounds have not progressed in a good way and unfortunately he has multiple new locations on the left foot as well that are giving him trouble. I am obviously concerned about how things are progressing at this point. I did actually have results to review for him today. This included his arterial  study which revealed that his ABIs were not obtainable and subsequently his TBI's were very low in the 0.35-0.30 range bilaterally. Subsequently his x-ray also revealed that there are potential changes on the x-ray at the heel on the right that could be associated with osteomyelitis. Nonetheless I do believe that the patient does get a likely require a vascular consult as well as an MRI to further evaluate the situation. In the meantime I am also going to recommend refilling the Omnicef to continue treatment as such since that does seem to have been at least somewhat beneficial for him. 03/10/2019 upon evaluation today patient appears to be doing unfortunately a little bit worse in regard to new areas of what appear to be necrotic tissue showing up at multiple places on his lower extremity. I am very concerned about this to be honest. In fact he has an appointment next Thursday with vascular for evaluation. I really want to see if we try to get this moved up sooner I discussed this with the patient he is in agreement with that. He is also going for his MRI today once he leaves the clinic here. Nonetheless I  am concerned about his arterial status based on what we are seeing currently. 03/17/2019 on evaluation today patient appears to be doing poorly at this point in regard to his bilateral lower extremities. Unfortunately he is having new wounds show up each time I see him we have been seeing him on a weekly schedule. Subsequently he does have his arterial intervention for the right lower extremity scheduled for this coming Monday which is the 25th. Subsequently he has a second procedure actually scheduled for 2 February which is a Tuesday of the following week. He also is scheduled to see infectious disease on January 29 which is next Friday. All this is in place and good news. With that being said he has not heard anything from [facility] at this point as far as an appointment with Dr. Amalia Hailey in order  to see about evaluation with regard to the surgical intervention and debridement. Obviously I think if he has a chance that maintaining and preventing amputation it is good to be with reestablishing good blood flow, controlling the infection that is currently present, and subsequently seeing Dr. Amalia Hailey for surgical debridement of the Achilles, bursa, and calcaneus region. LOTTIE, SISKA ([id number]) Obviously good wound care plays a role in this but I do not believe that alone is good to be enough for this patient. I also think hyperbarics could play a role but the other key factors need to be in place first before we can even consider hyperbarics. 03/24/2019 upon evaluation today patient appears to be doing roughly the same in regard to his wounds. Unfortunately he still has several areas that are just small eschar regions they are very superficial nonetheless they obviously appear to be arterial in nature. He has undergone vascular intervention in regard to his right lower extremity that was this past Monday with the vascular surgeon. He did have a stent placed in the upper portion of the arterial flow system and then subsequently 3 areas that he had balloon angioplasty distal to this. He states the surgeon was extremely happy when he told him how the procedure went with the improved flow in fact the patient's color in his foot seems to be doing significantly better in my opinion. He also tells me it feels better and is not cold all the time like it used to be as far as his perception is concerned. He actually has his procedure for the left lower extremity scheduled for this coming Tuesday. Unfortunately he still has not heard anything from podiatry. Apparently they did get a call but unfortunately when his son called back he was not able to get in touch with anyone. I did give him the information to get in touch with him today if they will get in touch by tomorrow morning to give me a call and  let me know so we can see what is going on. I know that [facility] has reached out to him trying to schedule an appointment unfortunately just sounds like there is been some phone tag going on. Objective Constitutional Well-nourished and well-hydrated in no acute distress. Vitals Time Taken: 12:49 PM, Height: 68 in, Weight: 235 lbs, BMI: 35.7, Temperature: 97.5 F, Pulse: 84 bpm, Respiratory Rate: 16 breaths/min, Blood Pressure: 115/64 mmHg. Respiratory normal breathing without difficulty. Psychiatric this patient is able to make decisions and demonstrates good insight into disease process. Alert and Oriented x 3. pleasant and cooperative. General Notes: Patient's wounds currently again appear to be rather stable for the most  part I do not see any signs of the infection worsening he still has a significant amount of necrotic tissue in the Achilles region on the right. This is what I referred him to podiatry for as far as trying to clear away some of the necrotic tissue and allow for more appropriate healing. I also have referred him to infectious disease he will be seeing them tomorrow. I am hoping they will initiate IV antibiotic therapy due to the extensive nature of the infection in the Achilles area. Integumentary (Hair, Skin) Wound #1 status is Open. Original cause of wound was Gradually Appeared. The wound is located on the Right Calcaneus. The wound measures 4.4cm length x 4.5cm width x 0.1cm depth; 15.551cm^2 area and 1.555cm^3 volume. Wound #10 status is Open. Original cause of wound was Gradually Appeared. The wound is located on the Right,Dorsal Foot. The wound measures 5.5cm length x 1cm width x 0.1cm depth; 4.32cm^2 area and 0.432cm^3 volume. Wound #11 status is Open. Original cause of wound was Gradually Appeared. The wound is located on the Left Calcaneus. The wound measures 2cm length x 3cm width x 0.1cm depth; 4.712cm^2 area and 0.471cm^3 volume. Wound #12 status is  Open. Original cause of wound was Gradually Appeared. The wound is located on the Left Metatarsal head fifth. The wound measures 1.5cm length x 0.7cm width x 0.1cm depth; 0.825cm^2 area and 0.082cm^3 volume. [name], [name] ([id number]) Wound #13 status is Open. Original cause of wound was Gradually Appeared. The wound is located on the [place]. The wound measures 2cm length x 3.5cm width x 0.1cm depth; 5.498cm^2 area and 0.55cm^3 volume. There is a none present amount of drainage noted. There is no granulation within the wound bed. There is a large (67-100%) amount of necrotic tissue within the wound bed including Eschar. Wound #14 status is Open. Original cause of wound was Gradually Appeared. The wound is located on the Right,Lateral Malleolus. The wound measures 1cm length x 1cm width x 0.1cm depth; 0.785cm^2 area and 0.079cm^3 volume. The wound margin is indistinct and nonvisible. There is no granulation within the wound bed. There is a large (67-100%) amount of necrotic tissue within the wound bed including Eschar. Wound #2 status is Open. Original cause of wound was Gradually Appeared. The wound is located on the Right Toe Fourth. The wound measures 0.4cm length x 0.4cm width x 0.1cm depth; 0.126cm^2 area and 0.013cm^3 volume. Wound #3 status is Open. Original cause of wound was Pressure Injury. The wound is located on the Right Toe Fifth. The wound measures 1cm length x 0.9cm width x 0.1cm depth; 0.707cm^2 area and 0.071cm^3 volume. Wound #4 status is Open. Original cause of wound was Gradually Appeared. The wound is located on the Left,Lateral Toe Third. The wound measures 1.5cm length x 0.9cm width x 0.1cm depth; 1.06cm^2 area and 0.106cm^3 volume. Wound #5 status is Open. Original cause of wound was Gradually Appeared. The wound is located on the Left,Medial Toe Fourth. The wound measures 0.8cm length x 0.5cm width x 0.1cm depth; 0.314cm^2 area and 0.031cm^3 volume. Wound #6  status is Open. Original cause of wound was Gradually Appeared. The wound is located on the Left,Lateral Toe Fourth. The wound measures 1.5cm length x 1cm width x 0.3cm depth; 1.178cm^2 area and 0.353cm^3 volume. Wound #7 status is Open. Original cause of wound was Gradually Appeared. The wound is located on the Left,Medial Toe Fifth. The wound measures 1.2cm length x 0.9cm width x 0.1cm depth; 0.848cm^2 area and 0.085cm^3 volume.  Wound #8 status is Open. Original cause of wound was Gradually Appeared. The wound is located on the Right Achilles. The wound measures 11cm length x 4cm width x 0.7cm depth; 34.558cm^2 area and 24.19cm^3 volume. Wound #9 status is Open. Original cause of wound was Gradually Appeared. The wound is located on the Right,Lateral Foot. The wound measures 2cm length x 2.5cm width x 0.1cm depth; 3.927cm^2 area and 0.393cm^3 volume. Assessment Active Problems ICD-10 Pressure ulcer of right heel, unstageable Pressure ulcer of other site, unstageable Type 2 diabetes mellitus with other skin ulcer Non-pressure chronic ulcer of other part of left foot with fat layer exposed Non-pressure chronic ulcer of other part of right foot with fat layer exposed Other specified peripheral vascular diseases Atherosclerotic heart disease of native coronary artery without angina pectoris Essential (primary) hypertension Other specified urinary incontinence [name], [name] ([id number]) Plan Wound Cleansing: Wound #1 Right Calcaneus: Clean wound with Normal Saline. - in office Cleanse wound with mild soap and water Primary Wound Dressing: Wound #1 Right Calcaneus: Other: - betadine Wound #10 Right,Dorsal Foot: Other: - betadine Wound #11 Left Calcaneus: Other: - betadine Wound #12 Left Metatarsal head fifth: Other: - betadine Wound #13 Right,Plantar Foot: Other: - betadine Wound #14 Right,Lateral Malleolus: Other: - betadine Wound #2 Right Toe Fourth: Other: - betadine Wound  #3 Right Toe Fifth: Other: - betadine Wound #9 Right,Lateral Foot: Other: - betadine Wound #4 Left,Lateral Toe Third: [place] Wound #5 Left,Medial Toe Fourth: [place] Wound #6 Left,Lateral Toe Fourth: [place] Wound #7 Left,Medial Toe Fifth: [place] Wound #8 Right Achilles: Iodoflex Secondary Dressing: Wound #1 Right Calcaneus: Dry Gauze Conform/Kerlix Other - heel cup Wound #10 Right,Dorsal Foot: Dry Gauze Conform/Kerlix Other - heel cup Wound #11 Left Calcaneus: Dry Gauze Conform/Kerlix Other - heel cup Wound #12 Left Metatarsal head fifth: Dry Gauze Conform/Kerlix Other - heel cup Wound #13 Right,Plantar Foot: Dry Gauze Conform/Kerlix Other - heel cup Wound #14 Right,Lateral Malleolus: Dry Gauze MAXIMILLIANO, KERSH ([id number]) Conform/Kerlix Other - heel cup Wound #2 Right Toe Fourth: Dry Gauze Conform/Kerlix Other - heel cup Wound #3 Right Toe Fifth: Dry Gauze Conform/Kerlix Other - heel cup Wound #4 Left,Lateral Toe Third: Dry Gauze Conform/Kerlix Other - heel cup Wound #5 Left,Medial Toe Fourth: Dry Gauze Conform/Kerlix Other - heel cup Wound #6 Left,Lateral Toe Fourth: Dry Gauze Conform/Kerlix Other - heel cup Wound #7 Left,Medial Toe Fifth: Dry Gauze Conform/Kerlix Other - heel cup Wound #8 Right Achilles: Dry Gauze Conform/Kerlix Other - heel cup Wound #9 Right,Lateral Foot: Dry Gauze Conform/Kerlix Other - heel cup Dressing Change Frequency: Wound #1 Right Calcaneus: Change Dressing Tuesday and Thursday. - Tuesday by [facility] Wound #10 Right,Dorsal Foot: Change Dressing Tuesday and Thursday. - Tuesday by [facility] Wound #11 Left Calcaneus: Change Dressing Tuesday and Thursday. - Tuesday by [facility] Wound #12 Left Metatarsal head fifth: Change Dressing Tuesday and Thursday. - Tuesday by [facility] Wound #13 Right,Plantar Foot: Change Dressing Tuesday and Thursday. - Tuesday by [facility] Wound #14 Right,Lateral Malleolus: Change  Dressing Tuesday and Thursday. - Tuesday by [facility] Wound #2 Right Toe Fourth: Change Dressing Tuesday and Thursday. - Tuesday by [facility] Wound #3 Right Toe Fifth: Change Dressing Tuesday and Thursday. - Tuesday by [facility] Wound #4 Left,Lateral Toe Third: Change Dressing Tuesday and Thursday. - Tuesday by [facility] Wound #5 Left,Medial Toe Fourth: Change Dressing Tuesday and Thursday. - Tuesday by [facility] Wound #6 Left,Lateral Toe Fourth: Change Dressing Tuesday and Thursday. - Tuesday by [facility] Wound #7 Left,Medial Toe  Fifth: Change Dressing Tuesday and Thursday. - Tuesday by [facility] Wound #8 Right Achilles: BALIAN, SCHALLER ([id number]) Change Dressing Tuesday and Thursday. - Tuesday by [facility] Wound #9 Right,Lateral Foot: Change Dressing Tuesday and Thursday. - Tuesday by [facility] Follow-up Appointments: Wound #1 Right Calcaneus: Return Appointment in 1 week. Wound #10 Right,Dorsal Foot: Return Appointment in 1 week. Wound #11 Left Calcaneus: Return Appointment in 1 week. Wound #12 Left Metatarsal head fifth: Return Appointment in 1 week. Wound #13 Right,Plantar Foot: Return Appointment in 1 week. Wound #14 Right,Lateral Malleolus: Return Appointment in 1 week. Wound #2 Right Toe Fourth: Return Appointment in 1 week. Wound #3 Right Toe Fifth: Return Appointment in 1 week. Wound #4 Left,Lateral Toe Third: Return Appointment in 1 week. Wound #5 Left,Medial Toe Fourth: Return Appointment in 1 week. Wound #6 Left,Lateral Toe Fourth: Return Appointment in 1 week. Wound #7 Left,Medial Toe Fifth: Return Appointment in 1 week. Wound #8 Right Achilles: Return Appointment in 1 week. Wound #9 Right,Lateral Foot: Return Appointment in 1 week. Home Health: Wound #1 Right Calcaneus: Continue Home Health Visits - [place] Nurse may visit PRN to address patient s wound care needs. FACE TO FACE ENCOUNTER: MEDICARE and MEDICAID PATIENTS: I certify that this patient is under my care and that I had a face-to-face  encounter that meets the physician face-to-face encounter requirements with this patient on this date. The encounter with the patient was in whole or in part for the following MEDICAL CONDITION: (primary reason for [place]) MEDICAL NECESSITY: I certify, that based on my findings, NURSING services are a medically necessary home health service. HOME BOUND STATUS: I certify that my clinical findings support that this patient is homebound (i.e., Due to illness or injury, pt requires aid of supportive devices such as crutches, cane, wheelchairs, walkers, the use of special transportation or the assistance of another person to leave their place of residence. There is a normal inability to leave the home and doing so requires considerable and taxing effort. Other absences are for medical reasons / religious services and are infrequent or of short duration when for other reasons). If current dressing causes regression in wound condition, may D/C ordered dressing product/s and apply Normal Saline Moist Dressing daily until next [place] / Other MD appointment. [place] of regression in wound condition at [phone]. Please direct any NON-WOUND related issues/requests for orders to patient's Primary Care Physician Wound #10 Right,Dorsal Foot: [place] Visits - [place] Nurse may visit PRN to address patient s wound care needs. FACE TO FACE ENCOUNTER: MEDICARE and MEDICAID PATIENTS: I certify that this patient is under my care and that I had a face-to-face encounter that meets the physician face-to-face encounter requirements with this patient on this date. The encounter with the patient was in whole or in part for the following MEDICAL CONDITION: (primary reason for [place]) MEDICAL NECESSITY: I certify, that based on my findings, NURSING services are a medically necessary home health service. HOME BOUND STATUS: I certify that my  clinical findings support that this patient is homebound (i.e., Due to illness or injury, pt requires aid of supportive devices such as crutches, cane, wheelchairs, walkers, the use of special transportation or the assistance of another person to leave their place of residence. There is a normal inability to leave the home and doing so requires considerable and taxing effort. Other absences are for medical reasons / religious services and LAZAR, TIERCE ([id number]) are  infrequent or of short duration when for other reasons). If current dressing causes regression in wound condition, may D/C ordered dressing product/s and apply Normal Saline Moist Dressing daily until next [place] / Other MD appointment. [place] of regression in wound condition at [phone]. Please direct any NON-WOUND related issues/requests for orders to patient's Primary Care Physician Wound #11 Left Calcaneus: [place] Visits - [place] Nurse may visit PRN to address patient s wound care needs. FACE TO FACE ENCOUNTER: MEDICARE and MEDICAID PATIENTS: I certify that this patient is under my care and that I had a face-to-face encounter that meets the physician face-to-face encounter requirements with this patient on this date. The encounter with the patient was in whole or in part for the following MEDICAL CONDITION: (primary reason for [place]) MEDICAL NECESSITY: I certify, that based on my findings, NURSING services are a medically necessary home health service. HOME BOUND STATUS: I certify that my clinical findings support that this patient is homebound (i.e., Due to illness or injury, pt requires aid of supportive devices such as crutches, cane, wheelchairs, walkers, the use of special transportation or the assistance of another person to leave their place of residence. There is a normal inability to leave the home and doing so requires considerable and  taxing effort. Other absences are for medical reasons / religious services and are infrequent or of short duration when for other reasons). If current dressing causes regression in wound condition, may D/C ordered dressing product/s and apply Normal Saline Moist Dressing daily until next [place] / Other MD appointment. [place] of regression in wound condition at [phone]. Please direct any NON-WOUND related issues/requests for orders to patient's Primary Care Physician Wound #12 Left Metatarsal head fifth: [place] Nurse may visit PRN to address patient s wound care needs. FACE TO FACE ENCOUNTER: MEDICARE and MEDICAID PATIENTS: I certify that this patient is under my care and that I had a face-to-face encounter that meets the physician face-to-face encounter requirements with this patient on this date. The encounter with the patient was in whole or in part for the following MEDICAL CONDITION: (primary reason for [place]) MEDICAL NECESSITY: I certify, that based on my findings, NURSING services are a medically necessary home health service. HOME BOUND STATUS: I certify that my clinical findings support that this patient is homebound (i.e., Due to illness or injury, pt requires aid of supportive devices such as crutches, cane, wheelchairs, walkers, the use of special transportation or the assistance of another person to leave their place of residence. There is a normal inability to leave the home and doing so requires considerable and taxing effort. Other absences are for medical reasons / religious services and are infrequent or of short duration when for other reasons). If current dressing causes regression in wound condition, may D/C ordered dressing product/s and apply Normal Saline Moist Dressing daily until next [place] / Other MD appointment. [place] of regression in wound  condition at [phone]. Please direct any NON-WOUND related issues/requests for orders to patient's Primary Care Physician Wound #13 Right,Plantar Foot: [place] Visits - [place] Nurse may visit PRN to address patient s wound care needs. FACE TO FACE ENCOUNTER: MEDICARE and MEDICAID PATIENTS: I certify that this patient is under my care and that I had a face-to-face encounter that meets the physician face-to-face encounter requirements with this  patient on this date. The encounter with the patient was in whole or in part for the following MEDICAL CONDITION: (primary reason for [place]) MEDICAL NECESSITY: I certify, that based on my findings, NURSING services are a medically necessary home health service. HOME BOUND STATUS: I certify that my clinical findings support that this patient is homebound (i.e., Due to illness or injury, pt requires aid of supportive devices such as crutches, cane, wheelchairs, walkers, the use of special transportation or the assistance of another person to leave their place of residence. There is a normal inability to leave the home and doing so requires considerable and taxing effort. Other absences are for medical reasons / religious services and are infrequent or of short duration when for other reasons). If current dressing causes regression in wound condition, may D/C ordered dressing product/s and apply Normal Saline Moist Dressing daily until next [place] / Other MD appointment. [place] of regression in wound condition at [phone]. Please direct any NON-WOUND related issues/requests for orders to patient's Primary Care Physician Wound #14 Right,Lateral Malleolus: [place] Visits - [place] Nurse may visit PRN to address patient s wound care needs. FACE TO FACE ENCOUNTER: MEDICARE and MEDICAID PATIENTS: I certify that this patient is under my care and that I had a  face-to-face encounter that meets the physician face-to-face encounter requirements with this patient on this date. The encounter with the patient was in whole or in part for the following MEDICAL CONDITION: (primary reason for [place]) MEDICAL NECESSITY: I certify, that based on my findings, NURSING services are a medically necessary home [name], [name] ([id number]) health service. HOME BOUND STATUS: I certify that my clinical findings support that this patient is homebound (i.e., Due to illness or injury, pt requires aid of supportive devices such as crutches, cane, wheelchairs, walkers, the use of special transportation or the assistance of another person to leave their place of residence. There is a normal inability to leave the home and doing so requires considerable and taxing effort. Other absences are for medical reasons / religious services and are infrequent or of short duration when for other reasons). If current dressing causes regression in wound condition, may D/C ordered dressing product/s and apply Normal Saline Moist Dressing daily until next [place] / Other MD appointment. [name] of regression in wound condition at [phone]. Please direct any NON-WOUND related issues/requests for orders to patient's Primary Care Physician Wound #2 Right Toe Fourth: [place] Visits - [place] Nurse may visit PRN to address patient s wound care needs. FACE TO FACE ENCOUNTER: MEDICARE and MEDICAID PATIENTS: I certify that this patient is under my care and that I had a face-to-face encounter that meets the physician face-to-face encounter requirements with this patient on this date. The encounter with the patient was in whole or in part for the following MEDICAL CONDITION: (primary reason for [place]) MEDICAL NECESSITY: I certify, that based on my findings, NURSING services are a medically necessary home health service.  HOME BOUND STATUS: I certify that my clinical findings support that this patient is homebound (i.e., Due to illness or injury, pt requires aid of supportive devices such as crutches, cane, wheelchairs, walkers, the use of special transportation or the assistance of another person to leave their place of residence. There is a normal inability to leave the home and doing so requires considerable and taxing effort. Other absences are for medical  reasons / religious services and are infrequent or of short duration when for other reasons). If current dressing causes regression in wound condition, may D/C ordered dressing product/s and apply Normal Saline Moist Dressing daily until next [place] / Other MD appointment. [place] of regression in wound condition at [phone]. Please direct any NON-WOUND related issues/requests for orders to patient's Primary Care Physician Wound #3 Right Toe Fifth: [place] Visits - [place] Nurse may visit PRN to address patient s wound care needs. FACE TO FACE ENCOUNTER: MEDICARE and MEDICAID PATIENTS: I certify that this patient is under my care and that I had a face-to-face encounter that meets the physician face-to-face encounter requirements with this patient on this date. The encounter with the patient was in whole or in part for the following MEDICAL CONDITION: (primary reason for [place]) MEDICAL NECESSITY: I certify, that based on my findings, NURSING services are a medically necessary home health service. HOME BOUND STATUS: I certify that my clinical findings support that this patient is homebound (i.e., Due to illness or injury, pt requires aid of supportive devices such as crutches, cane, wheelchairs, walkers, the use of special transportation or the assistance of another person to leave their place of residence. There is a normal inability to leave the home and doing so requires considerable  and taxing effort. Other absences are for medical reasons / religious services and are infrequent or of short duration when for other reasons). If current dressing causes regression in wound condition, may D/C ordered dressing product/s and apply Normal Saline Moist Dressing daily until next [place] / Other MD appointment. [place] of regression in wound condition at [phone]. Please direct any NON-WOUND related issues/requests for orders to patient's Primary Care Physician Wound #4 Left,Lateral Toe Third: [place] Visits - [place] Nurse may visit PRN to address patient s wound care needs. FACE TO FACE ENCOUNTER: MEDICARE and MEDICAID PATIENTS: I certify that this patient is under my care and that I had a face-to-face encounter that meets the physician face-to-face encounter requirements with this patient on this date. The encounter with the patient was in whole or in part for the following MEDICAL CONDITION: (primary reason for [place]) MEDICAL NECESSITY: I certify, that based on my findings, NURSING services are a medically necessary home health service. HOME BOUND STATUS: I certify that my clinical findings support that this patient is homebound (i.e., Due to illness or injury, pt requires aid of supportive devices such as crutches, cane, wheelchairs, walkers, the use of special transportation or the assistance of another person to leave their place of residence. There is a normal inability to leave the home and doing so requires considerable and taxing effort. Other absences are for medical reasons / religious services and are infrequent or of short duration when for other reasons). If current dressing causes regression in wound condition, may D/C ordered dressing product/s and apply Normal Saline Moist Dressing daily until next [place] / Other MD appointment. [place] of regression in wound  condition at [phone]. Please direct any NON-WOUND related issues/requests for orders to patient's Primary Care Physician Wound #5 Left,Medial Toe Fourth: [place] Visits - [place] Nurse may visit PRN to address patient s wound care needs. DUC, CROCKET ([id number]) FACE TO FACE ENCOUNTER: MEDICARE and MEDICAID PATIENTS: I certify that this patient is under my care and that I had a  face-to-face encounter that meets the physician face-to-face encounter requirements with this patient on this date. The encounter with the patient was in whole or in part for the following MEDICAL CONDITION: (primary reason for [place]) MEDICAL NECESSITY: I certify, that based on my findings, NURSING services are a medically necessary home health service. HOME BOUND STATUS: I certify that my clinical findings support that this patient is homebound (i.e., Due to illness or injury, pt requires aid of supportive devices such as crutches, cane, wheelchairs, walkers, the use of special transportation or the assistance of another person to leave their place of residence. There is a normal inability to leave the home and doing so requires considerable and taxing effort. Other absences are for medical reasons / religious services and are infrequent or of short duration when for other reasons). If current dressing causes regression in wound condition, may D/C ordered dressing product/s and apply Normal Saline Moist Dressing daily until next [place] / Other MD appointment. [place] of regression in wound condition at [phone]. Please direct any NON-WOUND related issues/requests for orders to patient's Primary Care Physician Wound #6 Left,Lateral Toe Fourth: [place] Visits - [place] Nurse may visit PRN to address patient s wound care needs. FACE TO FACE ENCOUNTER: MEDICARE and MEDICAID PATIENTS: I certify that this patient is  under my care and that I had a face-to-face encounter that meets the physician face-to-face encounter requirements with this patient on this date. The encounter with the patient was in whole or in part for the following MEDICAL CONDITION: (primary reason for [place]) MEDICAL NECESSITY: I certify, that based on my findings, NURSING services are a medically necessary home health service. HOME BOUND STATUS: I certify that my clinical findings support that this patient is homebound (i.e., Due to illness or injury, pt requires aid of supportive devices such as crutches, cane, wheelchairs, walkers, the use of special transportation or the assistance of another person to leave their place of residence. There is a normal inability to leave the home and doing so requires considerable and taxing effort. Other absences are for medical reasons / religious services and are infrequent or of short duration when for other reasons). If current dressing causes regression in wound condition, may D/C ordered dressing product/s and apply Normal Saline Moist Dressing daily until next [place] / Other MD appointment. [place] of regression in wound condition at [phone]. Please direct any NON-WOUND related issues/requests for orders to patient's Primary Care Physician Wound #7 Left,Medial Toe Fifth: [place] Visits - [place] Nurse may visit PRN to address patient s wound care needs. FACE TO FACE ENCOUNTER: MEDICARE and MEDICAID PATIENTS: I certify that this patient is under my care and that I had a face-to-face encounter that meets the physician face-to-face encounter requirements with this patient on this date. The encounter with the patient was in whole or in part for the following MEDICAL CONDITION: (primary reason for [place]) MEDICAL NECESSITY: I certify, that based on my findings, NURSING services are a medically necessary home health  service. HOME BOUND STATUS: I certify that my clinical findings support that this patient is homebound (i.e., Due to illness or injury, pt requires aid of supportive devices such as crutches, cane, wheelchairs, walkers, the use of special transportation or the assistance of another person to leave their place of residence. There is a normal inability to leave the home and doing so requires considerable  and taxing effort. Other absences are for medical reasons / religious services and are infrequent or of short duration when for other reasons). If current dressing causes regression in wound condition, may D/C ordered dressing product/s and apply Normal Saline Moist Dressing daily until next [place] / Other MD appointment. [place] of regression in wound condition at [phone]. Please direct any NON-WOUND related issues/requests for orders to patient's Primary Care Physician Wound #8 Right Achilles: [place] Visits - [place] Nurse may visit PRN to address patient s wound care needs. FACE TO FACE ENCOUNTER: MEDICARE and MEDICAID PATIENTS: I certify that this patient is under my care and that I had a face-to-face encounter that meets the physician face-to-face encounter requirements with this patient on this date. The encounter with the patient was in whole or in part for the following MEDICAL CONDITION: (primary reason for [place]) MEDICAL NECESSITY: I certify, that based on my findings, NURSING services are a medically necessary home health service. HOME BOUND STATUS: I certify that my clinical findings support that this patient is homebound (i.e., Due to illness or injury, pt requires aid of supportive devices such as crutches, cane, wheelchairs, walkers, the use of special transportation or the assistance of another person to leave their place of residence. There is a normal inability to leave the home and doing so requires  considerable and taxing effort. Other absences are for medical reasons / religious services and are infrequent or of short duration when for other reasons). If current dressing causes regression in wound condition, may D/C ordered dressing product/s and apply Normal Saline Moist Dressing daily until next [place] / Other MD appointment. [place] of regression in wound condition at [phone]. FALLOU, HULBERT ([id number]) Please direct any NON-WOUND related issues/requests for orders to patient's Primary Care Physician Wound #9 Right,Lateral Foot: [place] Visits - [place] Nurse may visit PRN to address patient s wound care needs. FACE TO FACE ENCOUNTER: MEDICARE and MEDICAID PATIENTS: I certify that this patient is under my care and that I had a face-to-face encounter that meets the physician face-to-face encounter requirements with this patient on this date. The encounter with the patient was in whole or in part for the following MEDICAL CONDITION: (primary reason for [place]) MEDICAL NECESSITY: I certify, that based on my findings, NURSING services are a medically necessary home health service. HOME BOUND STATUS: I certify that my clinical findings support that this patient is homebound (i.e., Due to illness or injury, pt requires aid of supportive devices such as crutches, cane, wheelchairs, walkers, the use of special transportation or the assistance of another person to leave their place of residence. There is a normal inability to leave the home and doing so requires considerable and taxing effort. Other absences are for medical reasons / religious services and are infrequent or of short duration when for other reasons). If current dressing causes regression in wound condition, may D/C ordered dressing product/s and apply Normal Saline Moist Dressing daily until next [place] / Other MD appointment. [place] of regression in wound condition at [phone]. Please direct any NON-WOUND related issues/requests for orders to patient's Primary Care Physician 1. My suggestion currently is can be that we continue with Iodoflex to the Achilles region and Betadine to all other locations at this point we will put alginate between the toes to keep these nice and dry I does  seem to have been beneficial for the patient as well. 2. I would recommend as well that he continue to follow-up with vascular surgeon obviously they are doing well as far as improving his arterial flow and the right seems to be doing better now we need to work on the left so we can prevent new areas from occurring and hopefully get things moving in a better direction. 3. He also be seen infectious disease tomorrow he has medication that actually ends tomorrow that was prescribed by myself that is cefdinir. At that point I will turn over care of the antibiotic therapy to the infectious disease specialist to proceed have a feel appropriate. 4. With regard to the appointment with Dr. Amalia Hailey regarding surgical intervention for the Achilles area they are to contact the office when they leave here today and will let me know if they are unable to get in touch with them. We will see patient back for reevaluation in 1 week here in the clinic. If anything worsens or changes patient will contact our office for additional recommendations. Electronic Signature(s) Signed: 03/24/2019 4:56:19 PM By: Worthy Keeler PA-C Entered By: Worthy Keeler on 03/24/2019 16:56:19 Pion, [name] ([id number]) -------------------------------------------------------------------------------- SuperBill Details Patient Name: KOAH, CHISENHALL. Date of Service: 03/24/2019 Medical Record Number: [id number] Patient Account Number: [id number] Date of Birth/Sex: [birth year] (86 y.o. M) Treating RN: Cornell Barman Primary Care Provider: Caryl Pina Other  Clinician: Referring Provider: Caryl Pina Treating Provider/Extender: Melburn Hake, [name]: 7 Diagnosis Coding ICD-10 Codes Code Description L89.610 Pressure ulcer of right heel, unstageable L89.890 Pressure ulcer of other site, unstageable E11.622 Type 2 diabetes mellitus with other skin ulcer L97.522 Non-pressure chronic ulcer of other part of left foot with fat layer exposed L97.512 Non-pressure chronic ulcer of other part of right foot with fat layer exposed I73.89 Other specified peripheral vascular diseases I25.10 Atherosclerotic heart disease of native coronary artery without angina pectoris I10 Essential (primary) hypertension N39.498 Other specified urinary incontinence Facility Procedures CPT4 Code: 92010071 Description: 21975 - WOUND CARE VISIT-LEV 5 EST PT Modifier: Quantity: 1 Physician Procedures CPT4 Code: 8832549 Description: 99214 - WC PHYS LEVEL 4 - EST PT ICD-10 Diagnosis Description L89.610 Pressure ulcer of right heel, unstageable L89.890 Pressure ulcer of other site, unstageable E11.622 Type 2 diabetes mellitus with other skin ulcer L97.522 Non-pressure  chronic ulcer of other part of left foot with fat Modifier: layer exposed Quantity: 1 Electronic Signature(s) Signed: 03/24/2019 4:56:51 PM By: Worthy Keeler PA-C Previous Signature: 03/24/2019 3:10:59 PM Version By: Gretta Cool, BSN, RN, CWS, Kim RN, BSN Entered By: Worthy Keeler on 03/24/2019 16:56:49

## 2019-03-25 ENCOUNTER — Ambulatory Visit (INDEPENDENT_AMBULATORY_CARE_PROVIDER_SITE_OTHER): Payer: Medicare Other | Admitting: Infectious Diseases

## 2019-03-25 ENCOUNTER — Other Ambulatory Visit: Payer: Self-pay

## 2019-03-25 ENCOUNTER — Other Ambulatory Visit: Admission: RE | Admit: 2019-03-25 | Payer: Medicare Other | Source: Ambulatory Visit

## 2019-03-25 ENCOUNTER — Encounter: Payer: Self-pay | Admitting: Infectious Diseases

## 2019-03-25 DIAGNOSIS — Q057 Lumbar spina bifida without hydrocephalus: Secondary | ICD-10-CM | POA: Diagnosis not present

## 2019-03-25 DIAGNOSIS — R350 Frequency of micturition: Secondary | ICD-10-CM | POA: Diagnosis not present

## 2019-03-25 DIAGNOSIS — I7025 Atherosclerosis of native arteries of other extremities with ulceration: Secondary | ICD-10-CM | POA: Diagnosis not present

## 2019-03-25 DIAGNOSIS — E1121 Type 2 diabetes mellitus with diabetic nephropathy: Secondary | ICD-10-CM | POA: Diagnosis not present

## 2019-03-25 DIAGNOSIS — I739 Peripheral vascular disease, unspecified: Secondary | ICD-10-CM | POA: Diagnosis not present

## 2019-03-25 DIAGNOSIS — N1832 Chronic kidney disease, stage 3b: Secondary | ICD-10-CM

## 2019-03-25 DIAGNOSIS — M869 Osteomyelitis, unspecified: Secondary | ICD-10-CM | POA: Insufficient documentation

## 2019-03-25 DIAGNOSIS — M86371 Chronic multifocal osteomyelitis, right ankle and foot: Secondary | ICD-10-CM | POA: Diagnosis not present

## 2019-03-25 DIAGNOSIS — R3589 Other polyuria: Secondary | ICD-10-CM | POA: Insufficient documentation

## 2019-03-25 DIAGNOSIS — R358 Other polyuria: Secondary | ICD-10-CM | POA: Diagnosis not present

## 2019-03-25 DIAGNOSIS — E1122 Type 2 diabetes mellitus with diabetic chronic kidney disease: Secondary | ICD-10-CM

## 2019-03-25 NOTE — Assessment & Plan Note (Signed)
Appreciate recent vascular eval.

## 2019-03-25 NOTE — Assessment & Plan Note (Signed)
He thinks his last A1C was 7.9.  Knows that he needs better control.

## 2019-03-25 NOTE — Assessment & Plan Note (Signed)
Will avoid vanco in his future anbx.

## 2019-03-25 NOTE — Addendum Note (Signed)
Addended by: Ridgely Anastacio C on: 03/25/2019 11:24 AM   Modules accepted: Orders

## 2019-03-25 NOTE — Assessment & Plan Note (Signed)
He has mutliple wounds and multiple risk factors (DM, CAD, CKD, poor nutrition, prev smoker).  I re-iterated to him that he needs good diabetic control and nutrition for his best chances at healing ( he is at risk of limb loss).  I asked him to stop cefdenir Will ask podiatry if they can do bone bx off anbx next week.  Will place central line in preparation of long term anbx.  Will see him back in 2 weeks.

## 2019-03-25 NOTE — Progress Notes (Signed)
   Subjective:    Patient ID: Bobby Lamas., male    DOB: 04-Apr-1930, 86 y.o.   MRN: [id number]  HPI 86 yo M with hx of scratch on his R heel. This was roughly 4-6 weeks ago. The area has become progressively larger. He has been seen at wound center in [place]. It developed drainage from the wound. No f/c. No erythema or streaking.  On 1-26 he underwent angioplasty and stent in his R leg and is planned for L leg eval next week. He was started on cefdinir at the wound care center (? Date).    He has a hx of DM2 x 41 years (got from [facility] 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   Also hx of CABG. Had COVID in [place].   There is concern about a "bladder infection". His urine has been more cloudy.   He had plain films done on 12-24 that showed a questionable lucency. This was followed by MRI (03-10-19) that showed:  Findings consistent with osteomyelitis in the calcaneus which is worst in the posterior body at the Achilles tendon insertion. *Severe Achilles tendinopathy with a partial tear of the tendon. *There is a mild enhancement the distal fibers of the tendon worrisome for infection. *Complex fluid in the retrocalcaneal bursa worrisome for septic bursitis.Negative for septic joint. *Patchy, mild marrow edema in talus, navicular and bones of the midfoot is likely due to stress reactive change.  He has appt with Dr Amalia Hailey at [facility] next week.   Lives at home.   The past medical history, family history and social history were reviewed/updated in EPIC   Review of Systems  Constitutional: Negative for appetite change, chills, fever and unexpected weight change.  Respiratory: Negative for cough and shortness of breath.   Cardiovascular: Negative for chest pain.  Genitourinary: Negative for decreased urine volume, difficulty urinating, dysuria and urgency.  Skin: Positive for wound.       Objective:   Physical Exam Constitutional:      Appearance: Normal appearance.  HENT:      Mouth/Throat:     Mouth: Mucous membranes are moist.     Pharynx: No oropharyngeal exudate.  Eyes:     Extraocular Movements: Extraocular movements intact.     Pupils: Pupils are equal, round, and reactive to light.  Cardiovascular:     Rate and Rhythm: Normal rate and regular rhythm.  Pulmonary:     Effort: Pulmonary effort is normal.     Breath sounds: Normal breath sounds.  Abdominal:     General: Bowel sounds are normal.     Palpations: Abdomen is soft.  Musculoskeletal:        General: Normal range of motion.     Cervical back: Normal range of motion and neck supple.     Right lower leg: No edema.     Left lower leg: No edema.  Skin:      Neurological:     General: No focal deficit present.     Mental Status: He is alert and oriented to person, place, and time.  Psychiatric:        Mood and Affect: Mood normal.                 Assessment & Plan:

## 2019-03-25 NOTE — Assessment & Plan Note (Signed)
Suspect incontinence.  He would like eval for UTI, will send UCx UA Suspect he is colonized.

## 2019-03-28 ENCOUNTER — Other Ambulatory Visit (INDEPENDENT_AMBULATORY_CARE_PROVIDER_SITE_OTHER): Payer: Self-pay | Admitting: Nurse Practitioner

## 2019-03-28 ENCOUNTER — Ambulatory Visit: Payer: Medicare Other | Admitting: Family Medicine

## 2019-03-28 ENCOUNTER — Other Ambulatory Visit: Payer: Self-pay

## 2019-03-28 MED ORDER — CLINDAMYCIN PHOSPHATE 300 MG/50ML IV SOLN: 300.0000 mg | Freq: Once | INTRAVENOUS | Status: AC

## 2019-03-28 NOTE — Progress Notes (Signed)
error 

## 2019-03-29 ENCOUNTER — Other Ambulatory Visit: Payer: Self-pay

## 2019-03-29 ENCOUNTER — Ambulatory Visit
Admission: RE | Admit: 2019-03-29 | Discharge: 2019-03-29 | Disposition: A | Discharge status: Home / Self Care | Payer: Medicare Other | Attending: Vascular Surgery | Admitting: Vascular Surgery

## 2019-03-29 ENCOUNTER — Encounter
Admission: RE | Disposition: A | Discharge status: Home / Self Care | Payer: Self-pay | Source: Home / Self Care | Attending: Vascular Surgery

## 2019-03-29 ENCOUNTER — Encounter: Payer: Self-pay | Admitting: Vascular Surgery

## 2019-03-29 DIAGNOSIS — N189 Chronic kidney disease, unspecified: Secondary | ICD-10-CM | POA: Insufficient documentation

## 2019-03-29 DIAGNOSIS — I70235 Atherosclerosis of native arteries of right leg with ulceration of other part of foot: Secondary | ICD-10-CM | POA: Diagnosis not present

## 2019-03-29 DIAGNOSIS — E785 Hyperlipidemia, unspecified: Secondary | ICD-10-CM | POA: Diagnosis not present

## 2019-03-29 DIAGNOSIS — I70299 Other atherosclerosis of native arteries of extremities, unspecified extremity: Secondary | ICD-10-CM

## 2019-03-29 DIAGNOSIS — I70245 Atherosclerosis of native arteries of left leg with ulceration of other part of foot: Secondary | ICD-10-CM | POA: Insufficient documentation

## 2019-03-29 DIAGNOSIS — I252 Old myocardial infarction: Secondary | ICD-10-CM | POA: Insufficient documentation

## 2019-03-29 DIAGNOSIS — I129 Hypertensive chronic kidney disease with stage 1 through stage 4 chronic kidney disease, or unspecified chronic kidney disease: Secondary | ICD-10-CM | POA: Insufficient documentation

## 2019-03-29 DIAGNOSIS — Z882 Allergy status to sulfonamides status: Secondary | ICD-10-CM | POA: Diagnosis not present

## 2019-03-29 DIAGNOSIS — Z87891 Personal history of nicotine dependence: Secondary | ICD-10-CM | POA: Diagnosis not present

## 2019-03-29 DIAGNOSIS — I6529 Occlusion and stenosis of unspecified carotid artery: Secondary | ICD-10-CM | POA: Insufficient documentation

## 2019-03-29 DIAGNOSIS — F329 Major depressive disorder, single episode, unspecified: Secondary | ICD-10-CM | POA: Insufficient documentation

## 2019-03-29 DIAGNOSIS — E1122 Type 2 diabetes mellitus with diabetic chronic kidney disease: Secondary | ICD-10-CM | POA: Diagnosis not present

## 2019-03-29 DIAGNOSIS — E876 Hypokalemia: Secondary | ICD-10-CM | POA: Insufficient documentation

## 2019-03-29 DIAGNOSIS — L97519 Non-pressure chronic ulcer of other part of right foot with unspecified severity: Secondary | ICD-10-CM | POA: Insufficient documentation

## 2019-03-29 DIAGNOSIS — E11621 Type 2 diabetes mellitus with foot ulcer: Secondary | ICD-10-CM | POA: Insufficient documentation

## 2019-03-29 DIAGNOSIS — I70239 Atherosclerosis of native arteries of right leg with ulceration of unspecified site: Secondary | ICD-10-CM | POA: Diagnosis not present

## 2019-03-29 DIAGNOSIS — Q059 Spina bifida, unspecified: Secondary | ICD-10-CM | POA: Diagnosis not present

## 2019-03-29 DIAGNOSIS — Z886 Allergy status to analgesic agent status: Secondary | ICD-10-CM | POA: Insufficient documentation

## 2019-03-29 DIAGNOSIS — I2510 Atherosclerotic heart disease of native coronary artery without angina pectoris: Secondary | ICD-10-CM | POA: Diagnosis not present

## 2019-03-29 DIAGNOSIS — F419 Anxiety disorder, unspecified: Secondary | ICD-10-CM | POA: Diagnosis not present

## 2019-03-29 DIAGNOSIS — L97529 Non-pressure chronic ulcer of other part of left foot with unspecified severity: Secondary | ICD-10-CM | POA: Diagnosis not present

## 2019-03-29 DIAGNOSIS — I70249 Atherosclerosis of native arteries of left leg with ulceration of unspecified site: Secondary | ICD-10-CM | POA: Diagnosis not present

## 2019-03-29 DIAGNOSIS — I70223 Atherosclerosis of native arteries of extremities with rest pain, bilateral legs: Secondary | ICD-10-CM | POA: Diagnosis present

## 2019-03-29 HISTORY — PX: LOWER EXTREMITY ANGIOGRAPHY: CATH118251

## 2019-03-29 LAB — BUN: BUN: 36 mg/dL — ABNORMAL HIGH (ref 8–23)

## 2019-03-29 LAB — CREATININE, SERUM
Creatinine, Ser: 1.49 mg/dL — ABNORMAL HIGH (ref 0.61–1.24)
GFR calc Af Amer: 49 mL/min — ABNORMAL LOW (ref >60)
GFR calc non Af Amer: 42 mL/min — ABNORMAL LOW (ref >60)

## 2019-03-29 LAB — GLUCOSE, CAPILLARY
Glucose-Capillary: 130 mg/dL — ABNORMAL HIGH (ref 70–99)
Glucose-Capillary: 150 mg/dL — ABNORMAL HIGH (ref 70–99)

## 2019-03-29 SURGERY — LOWER EXTREMITY ANGIOGRAPHY
Anesthesia: Moderate Sedation | Site: Leg Lower | Laterality: Left

## 2019-03-29 MED ORDER — FAMOTIDINE 20 MG PO TABS: 40.0000 mg | ORAL_TABLET | Freq: Once | ORAL | Status: DC | PRN

## 2019-03-29 MED ORDER — HYDROMORPHONE HCL 1 MG/ML IJ SOLN: 1.0000 mg | Freq: Once | INTRAMUSCULAR | Status: DC | PRN

## 2019-03-29 MED ORDER — MIDAZOLAM HCL 2 MG/ML PO SYRP: 8.0000 mg | ORAL_SOLUTION | Freq: Once | ORAL | Status: DC | PRN

## 2019-03-29 MED ORDER — MIDAZOLAM HCL 2 MG/2ML IJ SOLN
INTRAMUSCULAR | Status: DC | PRN
  Administered 2019-03-29: 1 mg via INTRAVENOUS

## 2019-03-29 MED ORDER — FENTANYL CITRATE (PF) 100 MCG/2ML IJ SOLN
INTRAMUSCULAR | Status: DC | PRN
  Administered 2019-03-29: 50 ug via INTRAVENOUS

## 2019-03-29 MED ORDER — HEPARIN SODIUM (PORCINE) 1000 UNIT/ML IJ SOLN
INTRAMUSCULAR | Status: DC | PRN
  Administered 2019-03-29: 4000 [IU] via INTRAVENOUS

## 2019-03-29 MED ORDER — IODIXANOL 320 MG/ML IV SOLN
INTRAVENOUS | Status: DC | PRN
  Administered 2019-03-29: 70 mL via INTRA_ARTERIAL

## 2019-03-29 MED ORDER — ONDANSETRON HCL 4 MG/2ML IJ SOLN: 4.0000 mg | Freq: Four times a day (QID) | INTRAMUSCULAR | Status: DC | PRN

## 2019-03-29 MED ORDER — HEPARIN SODIUM (PORCINE) 1000 UNIT/ML IJ SOLN
INTRAMUSCULAR | Status: AC
  Filled 2019-03-29: qty 1

## 2019-03-29 MED ORDER — CLINDAMYCIN PHOSPHATE 300 MG/50ML IV SOLN
INTRAVENOUS | Status: AC
  Administered 2019-03-29: 300 mg via INTRAVENOUS
  Filled 2019-03-29: qty 50

## 2019-03-29 MED ORDER — MIDAZOLAM HCL 5 MG/5ML IJ SOLN
INTRAMUSCULAR | Status: AC
  Filled 2019-03-29: qty 5

## 2019-03-29 MED ORDER — SODIUM CHLORIDE 0.9 % IV SOLN: INTRAVENOUS | Status: DC

## 2019-03-29 MED ORDER — FENTANYL CITRATE (PF) 100 MCG/2ML IJ SOLN
INTRAMUSCULAR | Status: AC
  Filled 2019-03-29: qty 2

## 2019-03-29 MED ORDER — MORPHINE SULFATE (PF) 4 MG/ML IV SOLN: 2.0000 mg | INTRAVENOUS | Status: DC | PRN

## 2019-03-29 MED ORDER — METHYLPREDNISOLONE SODIUM SUCC 125 MG IJ SOLR: 125.0000 mg | Freq: Once | INTRAMUSCULAR | Status: DC | PRN

## 2019-03-29 MED ORDER — DIPHENHYDRAMINE HCL 50 MG/ML IJ SOLN: 50.0000 mg | Freq: Once | INTRAMUSCULAR | Status: DC | PRN

## 2019-03-29 SURGICAL SUPPLY — 30 items
BALLN LUTONIX  018 4X60X130 (BALLOONS) ×2
BALLN LUTONIX 018 4X60X130 (BALLOONS) ×1
BALLN LUTONIX DCB 5X60X130 (BALLOONS) ×3
BALLN ULTRASCORE 014 3X40X150 (BALLOONS) ×3
BALLN ULTRVRSE 1.5X40X150 (BALLOONS) ×3
BALLN ULTRVRSE 2X300X150 (BALLOONS) ×3
BALLN ULTRVRSE 3X40X150 (BALLOONS) ×3
BALLOON LUTONIX 018 4X60X130 (BALLOONS) IMPLANT
BALLOON LUTONIX DCB 5X60X130 (BALLOONS) IMPLANT
BALLOON ULTRSCRE 014 3X40X150 (BALLOONS) IMPLANT
BALLOON ULTRVRSE 1.5X40X150 (BALLOONS) IMPLANT
BALLOON ULTRVRSE 2X300X150 (BALLOONS) IMPLANT
BALLOON ULTRVRSE 3X40X150 (BALLOONS) IMPLANT
CATH BEACON 5 .035 65 RIM TIP (CATHETERS) ×2 IMPLANT
CATH PIG 70CM (CATHETERS) ×2 IMPLANT
CATH SEEKER 014X150 (CATHETERS) ×2 IMPLANT
CATH VERT 5FR 125CM (CATHETERS) ×2 IMPLANT
DEVICE PRESTO INFLATION (MISCELLANEOUS) ×2 IMPLANT
DEVICE STARCLOSE SE CLOSURE (Vascular Products) ×2 IMPLANT
GLIDEWIRE ADV .035X260CM (WIRE) ×2 IMPLANT
NDL ENTRY 21GA 7CM ECHOTIP (NEEDLE) IMPLANT
NEEDLE ENTRY 21GA 7CM ECHOTIP (NEEDLE) ×3 IMPLANT
PACK ANGIOGRAPHY (CUSTOM PROCEDURE TRAY) ×3 IMPLANT
SET INTRO CAPELLA COAXIAL (SET/KITS/TRAYS/PACK) ×2 IMPLANT
SHEATH BRITE TIP 5FRX11 (SHEATH) ×2 IMPLANT
SHEATH RAABE 6FR (SHEATH) ×2 IMPLANT
SYR MEDRAD MARK 7 150ML (SYRINGE) ×2 IMPLANT
TUBING CONTRAST HIGH PRESS 72 (TUBING) ×2 IMPLANT
WIRE J 3MM .035X145CM (WIRE) ×2 IMPLANT
WIRE RUNTHROUGH .014X300CM (WIRE) ×2 IMPLANT

## 2019-03-29 NOTE — Progress Notes (Signed)
Upon removal of the IV to the right hand a hematoma was noted. The hematoma was expressed, gauze and coban was applied to the site for pressure. Patient was instructed that the area could possibly bruise and become sore. Son and patient were educated regarding the hematoma. Will call the office with any further concerns or questions regarding this.   Mattie Marlin, RN  03/29/2019

## 2019-03-29 NOTE — Op Note (Signed)
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 Introduction catheter into left lower extremity 3rd order catheter placement  2. Contrast injection left lower extremity for distal runoff with additional 3rd order  3. Percutaneous transluminal angioplasty left distal SFA and proximal to mid popliteal to 5 mm with a Lutonix drug-eluting balloon              4. Percutaneous transluminal angioplasty left anterior tibial to 3 mm  5. Percutaneous transluminal angioplasty left posterior tibial to 2.5 mm             6.  Star close closure right common femoral arteriotomy  Anesthesia: Conscious sedation was administered under my direct supervision by the interventional radiology RN. IV Versed plus fentanyl were utilized. Continuous ECG, pulse oximetry and blood pressure was monitored throughout the entire procedure.  Conscious sedation was for a total of 1 hour 17 minutes.  Sheath: 6 [place] Raby right common femoral retrograde  Contrast: 70 cc  Fluoroscopy Time: 15.3 minutes  Indications: [name]. presents with increasing pain of the left lower extremity.  He has bilateral foot ulcers.  This suggests the patient is having limb threatening ischemia.  He has undergone successful treatment for his right lower extremity and now returns for treatment of his left lower extremity.  The risks and benefits are reviewed all questions answered patient agrees to proceed with angiography and intervention for limb salvage.  Procedure:[name]. is a 86 y.o. y.o. male who was identified and appropriate procedural time out was performed. The  patient was then placed supine on the table and prepped and draped in the usual sterile fashion.   Ultrasound was placed in the sterile sleeve and the right groin was evaluated the right common femoral artery was echolucent and pulsatile indicating patency. Image was recorded for the permanent record and under real-time visualization a microneedle was inserted into the common femoral artery followed by the microwire and then the micro-sheath. A J-wire was then advanced through the micro-sheath and a 5 [place] sheath was then inserted over a J-wire.  A rim catheter with an advantage Glidewire was used to cross the aortic bifurcation the catheter wire were advanced down into the left distal external iliac artery. Oblique view of the femoral bifurcation was then obtained and subsequently the wire was reintroduced and the pigtail catheter negotiated into the SFA representing third order catheter placement. Distal runoff was then performed.  Diagnostic interpretation: The abdominal aorta and bilateral iliac system has been imaged approximately 1 week ago and are free of hemodynamically significant stenosis.  The left common femoral and profunda femoris are widely patent.  The left SFA is patent in its proximal two thirds.  Just distal to Hunter's canal there is an area of greater than 80% stenosis associated with diffuse atherosclerotic changes extending into the proximal to mid popliteal.  Distally the popliteal is patent.  Trifurcation demonstrates extensive disease there is a subtotal occlusion of the anterior tibial just past its origin.  Distal to that it is patent down to the foot filling the pedal arch without hemodynamically significant stenosis.  The tibioperoneal trunk is patent the posterior tibial demonstrates multiple subtotal occlusions throughout its entire length.  But distally it does fill the plantar arteries.  The peroneal is patent  but fairly small and does not have robust collaterals at the  level of the ankle and so does not contribute significantly to the distal forefoot.  5000 units of heparin was then given and allowed to circulate and a 6 [place] Raby sheath was advanced up and over the bifurcation and positioned in the proximal superficial femoral artery  Advantage wire and Kumpe catheter were then negotiated down into the distal popliteal. Catheter was then advanced. Hand injection contrast demonstrated the tibial anatomy in detail.  A 5 mm x 60 mm Lutonix drug-eluting balloon was used to angioplasty the distal SFA.  The inflation was for 2 minutes at 12 atm.  A 4 mm x 60 mm Lutonix drug-eluting balloon was then used to treat the mid popliteal extending down to the origin of the anterior tibial.  Again inflation was to 12 atm for 2 minutes.  Follow-up imaging demonstrated excellent patency with less than 10% residual stenosis.  The detector was then repositioned and a run-through 0.014 wire was negotiated into the anterior tibial.  Again the greater than 90% stenosis is noted in the proximal portion of the anterior tibial. A 3 mm Ultraverse balloon was then advanced across the lesion and inflated to 14 atm for 1 minute. Follow-up imaging demonstrated patency with excellent result and less than 10% residual stenosis.  The wire and catheter combination were then negotiated down into the posterior tibial with the wire negotiated all the way to the origin of the plantar vessels.  A 2.5 mm x 30 mm Ultraverse balloon was then used to angioplasty the proximal two thirds of the anterior tibial.  Inflation was to 12 atm for 1 minute.  Distally a 2 mm x 40 mm ultra versed balloon was utilized to treat the distalmost lesion.  Inflation again was to 12 atm for approximately 1 minute.  Follow-up imaging now demonstrated a rapid flow of contrast through the posterior tibial with filling of the plantar vessels and less than 15% residual stenosis.  After review of these images the sheath is pulled into  the right external iliac oblique of the common femoral is obtained and a Star close device deployed. There no immediate Complications.  Findings:  The abdominal aorta and bilateral iliac system has been imaged approximately 1 week ago and are free of hemodynamically significant stenosis.  The left common femoral and profunda femoris are widely patent.  The left SFA is patent in its proximal two thirds.  Just distal to Hunter's canal there is an area of greater than 80% stenosis associated with diffuse atherosclerotic changes extending into the proximal to mid popliteal.  Distally the popliteal is patent.  Trifurcation demonstrates extensive disease there is a subtotal occlusion of the anterior tibial just past its origin.  Distal to that it is patent down to the foot filling the pedal arch without hemodynamically significant stenosis.  The tibioperoneal trunk is patent the posterior tibial demonstrates multiple subtotal occlusions throughout its entire length.  But distally it does fill the plantar arteries.  The peroneal is patent but fairly small and does not have robust collaterals at the level of the ankle and so does not contribute significantly to the distal forefoot.  Following angioplasty of the distal SFA and popliteal there is less than 10% residual stenosis.  Following angioplasty of the anterior tibial as well as angioplasty of the posterior tibial there is less than 10% and 15% residual stenosis respectively.  Summary: Successful recanalization left lower extremity for limb salvage  Disposition: Patient was taken to the recovery room in stable condition having tolerated the procedure well.  [name] 03/29/2019,1:17 PM

## 2019-03-29 NOTE — Discharge Instructions (Signed)
Angiogram, Care After This sheet gives you information about how to care for yourself after your procedure. Your doctor may also give you more specific instructions. If you have problems or questions, contact your doctor. Follow these instructions at home: Insertion site care  Follow instructions from your doctor about how to take care of your long, thin tube (catheter) insertion area. Make sure you: ? Wash your hands with soap and water before you change your bandage (dressing). If you cannot use soap and water, use hand sanitizer. ? Change your bandage as told by your doctor. ? Leave stitches (sutures), skin glue, or skin tape (adhesive) strips in place. They may need to stay in place for 2 weeks or longer. If tape strips get loose and curl up, you may trim the loose edges. Do not remove tape strips completely unless your doctor says it is okay.  Do not take baths, swim, or use a hot tub until your doctor says it is okay.  You may shower 24-48 hours after the procedure or as told by your doctor. ? Gently wash the area with plain soap and water. ? Pat the area dry with a clean towel. ? Do not rub the area. This may cause bleeding.  Do not apply powder or lotion to the area. Keep the area clean and dry.  Check your insertion area every day for signs of infection. Check for: ? More redness, swelling, or pain. ? Fluid or blood. ? Warmth. ? Pus or a bad smell. Activity  Rest as told by your doctor, usually for 1-2 days.  Do not lift anything that is heavier than 10 lbs. (4.5 kg) or as told by your doctor.  Do not drive for 24 hours if you were given a medicine to help you relax (sedative).  Do not drive or use heavy machinery while taking prescription pain medicine. General instructions   Go back to your normal activities as told by your doctor, usually in about a week. Ask your doctor what activities are safe for you.  If the insertion area starts to bleed, lie flat and put  pressure on the area. If the bleeding does not stop, get help right away. This is an emergency.  Drink enough fluid to keep your pee (urine) clear or pale yellow.  Take over-the-counter and prescription medicines only as told by your doctor.  Keep all follow-up visits as told by your doctor. This is important. Contact a doctor if:  You have a fever.  You have chills.  You have more redness, swelling, or pain around your insertion area.  You have fluid or blood coming from your insertion area.  The insertion area feels warm to the touch.  You have pus or a bad smell coming from your insertion area.  You have more bruising around the insertion area.  Blood collects in the tissue around the insertion area (hematoma) that may be painful to the touch. Get help right away if:  You have a lot of pain in the insertion area.  The insertion area swells very fast.  The insertion area is bleeding, and the bleeding does not stop after holding steady pressure on the area.  The area near or just beyond the insertion area becomes pale, cool, tingly, or numb. These symptoms may be an emergency. Do not wait to see if the symptoms will go away. Get medical help right away. Call your local emergency services (911 in the [place] Do not drive yourself to the hospital.   Summary  After the procedure, it is common to have bruising and tenderness at the long, thin tube insertion area.  After the procedure, it is important to rest and drink plenty of fluids.  Do not take baths, swim, or use a hot tub until your doctor says it is okay to do so. You may shower 24-48 hours after the procedure or as told by your doctor.  If the insertion area starts to bleed, lie flat and put pressure on the area. If the bleeding does not stop, get help right away. This is an emergency. This information is not intended to replace advice given to you by your health care provider. Make sure you discuss any questions you have  with your health care provider. Document Revised: 01/23/2017 Document Reviewed: 02/05/2016 Elsevier Patient Education  2020 [employer]   Femoral Site Care This sheet gives you information about how to care for yourself after your procedure. Your health care provider may also give you more specific instructions. If you have problems or questions, contact your health care provider. What can I expect after the procedure? After the procedure, it is common to have:  Bruising that usually fades within 1-2 weeks.  Tenderness at the site. Follow these instructions at home: Wound care  Follow instructions from your health care provider about how to take care of your insertion site. Make sure you: ? Wash your hands with soap and water before you change your bandage (dressing). If soap and water are not available, use hand sanitizer. ? Change your dressing as told by your health care provider. ? Leave stitches (sutures), skin glue, or adhesive strips in place. These skin closures may need to stay in place for 2 weeks or longer. If adhesive strip edges start to loosen and curl up, you may trim the loose edges. Do not remove adhesive strips completely unless your health care provider tells you to do that.  Do not take baths, swim, or use a hot tub until your health care provider approves.  You may shower 24-48 hours after the procedure or as told by your health care provider. ? Gently wash the site with plain soap and water. ? Pat the area dry with a clean towel. ? Do not rub the site. This may cause bleeding.  Do not apply powder or lotion to the site. Keep the site clean and dry.  Check your femoral site every day for signs of infection. Check for: ? Redness, swelling, or pain. ? Fluid or blood. ? Warmth. ? Pus or a bad smell. Activity  For the first 2-3 days after your procedure, or as long as directed: ? Avoid climbing stairs as much as possible. ? Do not squat.  Do not lift  anything that is heavier than 10 lb (4.5 kg), or the limit that you are told, until your health care provider says that it is safe.  Rest as directed. ? Avoid sitting for a long time without moving. Get up to take short walks every 1-2 hours.  Do not drive for 24 hours if you were given a medicine to help you relax (sedative). General instructions  Take over-the-counter and prescription medicines only as told by your health care provider.  Keep all follow-up visits as told by your health care provider. This is important. Contact a health care provider if you have:  A fever or chills.  You have redness, swelling, or pain around your insertion site. Get help right away if:  The catheter   insertion area swells very fast.  You pass out.  You suddenly start to sweat or your skin gets clammy.  The catheter insertion area is bleeding, and the bleeding does not stop when you hold steady pressure on the area.  The area near or just beyond the catheter insertion site becomes pale, cool, tingly, or numb. These symptoms may represent a serious problem that is an emergency. Do not wait to see if the symptoms will go away. Get medical help right away. Call your local emergency services (911 in the [place] Do not drive yourself to the hospital. Summary  After the procedure, it is common to have bruising that usually fades within 1-2 weeks.  Check your femoral site every day for signs of infection.  Do not lift anything that is heavier than 10 lb (4.5 kg), or the limit that you are told, until your health care provider says that it is safe. This information is not intended to replace advice given to you by your health care provider. Make sure you discuss any questions you have with your health care provider. Document Revised: 02/23/2017 Document Reviewed: 02/23/2017 Elsevier Patient Education  2020 [employer]   Moderate Conscious Sedation, Adult, Care After These instructions provide you  with information about caring for yourself after your procedure. Your health care provider may also give you more specific instructions. Your treatment has been planned according to current medical practices, but problems sometimes occur. Call your health care provider if you have any problems or questions after your procedure. What can I expect after the procedure? After your procedure, it is common:  To feel sleepy for several hours.  To feel clumsy and have poor balance for several hours.  To have poor judgment for several hours.  To vomit if you eat too soon. Follow these instructions at home: For at least 24 hours after the procedure:   Do not: ? Participate in activities where you could fall or become injured. ? Drive. ? Use heavy machinery. ? Drink alcohol. ? Take sleeping pills or medicines that cause drowsiness. ? Make important decisions or sign legal documents. ? Take care of children on your own.  Rest. Eating and drinking  Follow the diet recommended by your health care provider.  If you vomit: ? Drink water, juice, or soup when you can drink without vomiting. ? Make sure you have little or no nausea before eating solid foods. General instructions  Have a responsible adult stay with you until you are awake and alert.  Take over-the-counter and prescription medicines only as told by your health care provider.  If you smoke, do not smoke without supervision.  Keep all follow-up visits as told by your health care provider. This is important. Contact a health care provider if:  You keep feeling nauseous or you keep vomiting.  You feel light-headed.  You develop a rash.  You have a fever. Get help right away if:  You have trouble breathing. This information is not intended to replace advice given to you by your health care provider. Make sure you discuss any questions you have with your health care provider. Document Revised: 01/23/2017 Document Reviewed:  06/02/2015 Elsevier Patient Education  2020 [employer] Inc.  

## 2019-03-29 NOTE — H&P (Signed)
[facility] History & Physical Update  The patient was interviewed and re-examined.  The patient's previous History and Physical has been reviewed and is unchanged.  There is no change in the plan of care. We plan to proceed with the scheduled procedure.  Hortencia Pilar, MD  03/29/2019, 10:06 AM

## 2019-03-30 ENCOUNTER — Ambulatory Visit (INDEPENDENT_AMBULATORY_CARE_PROVIDER_SITE_OTHER): Payer: Medicare Other

## 2019-03-30 ENCOUNTER — Encounter: Payer: Self-pay | Admitting: Cardiology

## 2019-03-30 ENCOUNTER — Ambulatory Visit (INDEPENDENT_AMBULATORY_CARE_PROVIDER_SITE_OTHER): Payer: Medicare Other | Admitting: Podiatry

## 2019-03-30 ENCOUNTER — Other Ambulatory Visit: Payer: Medicare Other

## 2019-03-30 ENCOUNTER — Other Ambulatory Visit: Payer: Self-pay | Admitting: Radiology

## 2019-03-30 DIAGNOSIS — E0843 Diabetes mellitus due to underlying condition with diabetic autonomic (poly)neuropathy: Secondary | ICD-10-CM | POA: Diagnosis not present

## 2019-03-30 DIAGNOSIS — M86171 Other acute osteomyelitis, right ankle and foot: Secondary | ICD-10-CM

## 2019-03-30 DIAGNOSIS — L97512 Non-pressure chronic ulcer of other part of right foot with fat layer exposed: Secondary | ICD-10-CM

## 2019-03-30 NOTE — Patient Instructions (Signed)
Pre-Operative Instructions  Congratulations, you have decided to take an important step towards improving your quality of life.  You can be assured that the doctors and staff at [facility] will be with you every step of the way.  Here are some important things you should know:  1. Plan to be at the surgery center/hospital at least 1 (one) hour prior to your scheduled time, unless otherwise directed by the surgical center/hospital staff.  You must have a responsible adult accompany you, remain during the surgery and drive you home.  Make sure you have directions to the surgical center/hospital to ensure you arrive on time. 2. If you are having surgery at [facility] or [facility], you will need a copy of your medical history and physical form from your family physician within one month prior to the date of surgery. We will give you a form for your primary physician to complete.  3. We make every effort to accommodate the date you request for surgery.  However, there are times where surgery dates or times have to be moved.  We will contact you as soon as possible if a change in schedule is required.   4. No aspirin/ibuprofen for one week before surgery.  If you are on aspirin, any non-steroidal anti-inflammatory medications (Mobic, Aleve, Ibuprofen) should not be taken seven (7) days prior to your surgery.  You make take Tylenol for pain prior to surgery.  5. Medications - If you are taking daily heart and blood pressure medications, seizure, reflux, allergy, asthma, anxiety, pain or diabetes medications, make sure you notify the surgery center/hospital before the day of surgery so they can tell you which medications you should take or avoid the day of surgery. 6. No food or drink after midnight the night before surgery unless directed otherwise by surgical center/hospital staff. 7. No alcoholic beverages 24-hours prior to surgery.  No smoking 24-hours prior or 24-hours after  surgery. 8. Wear loose pants or shorts. They should be loose enough to fit over bandages, boots, and casts. 9. Don't wear slip-on shoes. Sneakers are preferred. 10. Bring your boot with you to the surgery center/hospital.  Also bring crutches or a walker if your physician has prescribed it for you.  If you do not have this equipment, it will be provided for you after surgery. 11. If you have not been contacted by the surgery center/hospital by the day before your surgery, call to confirm the date and time of your surgery. 12. Leave-time from work may vary depending on the type of surgery you have.  Appropriate arrangements should be made prior to surgery with your employer. 13. Prescriptions will be provided immediately following surgery by your doctor.  Fill these as soon as possible after surgery and take the medication as directed. Pain medications will not be refilled on weekends and must be approved by the doctor. 14. Remove nail polish on the operative foot and avoid getting pedicures prior to surgery. 15. Wash the night before surgery.  The night before surgery wash the foot and leg well with water and the antibacterial soap provided. Be sure to pay special attention to beneath the toenails and in between the toes.  Wash for at least three (3) minutes. Rinse thoroughly with water and dry well with a towel.  Perform this wash unless told not to do so by your physician.  Enclosed: 1 Ice pack (please put in freezer the night before surgery)   1 Hibiclens skin cleaner     Pre-op instructions  If you have any questions regarding the instructions, please do not hesitate to call our office.  [place]: [address], [place], [place] [address] -- [phone]  [place]: [address] [place], [place] [address] -- [phone]  [place]: [address], [place], [place] [address] -- [phone]   Website: [email or web address] 

## 2019-03-31 ENCOUNTER — Telehealth: Payer: Self-pay

## 2019-03-31 ENCOUNTER — Encounter: Payer: Medicare Other | Attending: Physician Assistant | Admitting: Physician Assistant

## 2019-03-31 ENCOUNTER — Other Ambulatory Visit: Payer: Self-pay

## 2019-03-31 DIAGNOSIS — I10 Essential (primary) hypertension: Secondary | ICD-10-CM | POA: Insufficient documentation

## 2019-03-31 DIAGNOSIS — E11622 Type 2 diabetes mellitus with other skin ulcer: Secondary | ICD-10-CM | POA: Diagnosis not present

## 2019-03-31 DIAGNOSIS — I7389 Other specified peripheral vascular diseases: Secondary | ICD-10-CM | POA: Diagnosis not present

## 2019-03-31 DIAGNOSIS — L89899 Pressure ulcer of other site, unspecified stage: Secondary | ICD-10-CM | POA: Diagnosis not present

## 2019-03-31 DIAGNOSIS — L97411 Non-pressure chronic ulcer of right heel and midfoot limited to breakdown of skin: Secondary | ICD-10-CM | POA: Diagnosis not present

## 2019-03-31 DIAGNOSIS — I2510 Atherosclerotic heart disease of native coronary artery without angina pectoris: Secondary | ICD-10-CM | POA: Diagnosis not present

## 2019-03-31 DIAGNOSIS — L97421 Non-pressure chronic ulcer of left heel and midfoot limited to breakdown of skin: Secondary | ICD-10-CM | POA: Diagnosis not present

## 2019-03-31 DIAGNOSIS — E11621 Type 2 diabetes mellitus with foot ulcer: Secondary | ICD-10-CM | POA: Diagnosis not present

## 2019-03-31 DIAGNOSIS — L97511 Non-pressure chronic ulcer of other part of right foot limited to breakdown of skin: Secondary | ICD-10-CM | POA: Diagnosis not present

## 2019-03-31 DIAGNOSIS — L97311 Non-pressure chronic ulcer of right ankle limited to breakdown of skin: Secondary | ICD-10-CM | POA: Insufficient documentation

## 2019-03-31 DIAGNOSIS — L97412 Non-pressure chronic ulcer of right heel and midfoot with fat layer exposed: Secondary | ICD-10-CM | POA: Diagnosis not present

## 2019-03-31 DIAGNOSIS — L97522 Non-pressure chronic ulcer of other part of left foot with fat layer exposed: Secondary | ICD-10-CM | POA: Diagnosis not present

## 2019-03-31 NOTE — Progress Notes (Addendum)
FERNIE, GRIMM ([id number]) Visit Report for 03/31/2019 Chief Complaint Document Details Patient Name: [name], [name]. Date of Service: 03/31/2019 12:30 PM Medical Record Number: [id number] Patient Account Number: [id number] Date of Birth/Sex: Jul 07, 1930 (86 y.o. M) Treating RN: Army Melia Primary Care Provider: Caryl Pina Other Clinician: Referring Provider: Caryl Pina Treating Provider/Extender: Melburn Hake, Kelechi Astarita Weeks in Treatment: 8 Information Obtained from: Patient Chief Complaint Heel pressure ulcer and right 4th and 5th toe pressure ulcers along with multiple left toe ulcers Electronic Signature(s) Signed: 03/31/2019 1:00:46 PM By: Worthy Keeler PA-C Entered By: Worthy Keeler on 03/31/2019 13:00:46 [place], [name] ([id number]) -------------------------------------------------------------------------------- HPI Details Patient Name: [name], [name]. Date of Service: 03/31/2019 12:30 PM Medical Record Number: [id number] Patient Account Number: [id number] Date of Birth/Sex: Dec 26, 1930 (86 y.o. M) Treating RN: Army Melia Primary Care Provider: Caryl Pina Other Clinician: Referring Provider: Caryl Pina Treating Provider/Extender: Melburn Hake, Adryan Druckenmiller Weeks in Treatment: 8 History of Present Illness HPI Description: 02/03/2019 patient presents today for initial evaluation in our clinic concerning issues that he has been having with wounds on his right heel as well as the right fourth and fifth toes. The toe ulcers have been present for about 5 months. They are pretty much just dry and eschar covered at this point but appear to be stable. The heel ulcer has been present for about 2 months and is very soft the eschar is loosening and there appears to be purulent drainage as well noted at this point there is also erythema surrounding the wound bed. I am unsure if this is just inflammation or secondary to infection to be perfectly honest. With that being said  the patient has recently gotten Prevalon offloading boots and he is essentially nonambulatory at this point. He did have a coronary artery bypass graft x4 in 1989. Otherwise he tells me that he also has hypertension and urinary incontinence. Obviously that is not playing any role in his wounds currently based on what we are seeing today. 02/10/2019 on evaluation today patient appears to be doing quite well with regard to his wounds. He is not showing a lot of signs of improvement yet but he still has a lot of eschar at this point. He still awaiting the vascular evaluation at this time as well. Fortunately there is no signs of active infection. No fevers, chills, nausea, vomiting, or diarrhea. 02/17/2019 on evaluation today patient appears to be doing quite well with regard to his wound. This seems to be greatly improved compared to last visit I do believe that the cefdinir is doing well for him. Fortunately there is no signs of active infection at this time. No fever chills noted no fevers, chills, nausea, vomiting, or diarrhea. 03/03/2019 on evaluation today patient appears to be doing unfortunately worse even compared to his last visit. His wounds have not progressed in a good way and unfortunately he has multiple new locations on the left foot as well that are giving him trouble. I am obviously concerned about how things are progressing at this point. I did actually have results to review for him today. This included his arterial study which revealed that his ABIs were not obtainable and subsequently his TBI's were very low in the 0.35-0.30 range bilaterally. Subsequently his x-ray also revealed that there are potential changes on the x-ray at the heel on the right that could be associated with osteomyelitis. Nonetheless I do believe that the patient does get a likely require a vascular consult as well as  an MRI to further evaluate the situation. In the meantime I am also going to recommend  refilling the Omnicef to continue treatment as such since that does seem to have been at least somewhat beneficial for him. 03/10/2019 upon evaluation today patient appears to be doing unfortunately a little bit worse in regard to new areas of what appear to be necrotic tissue showing up at multiple places on his lower extremity. I am very concerned about this to be honest. In fact he has an appointment next Thursday with vascular for evaluation. I really want to see if we try to get this moved up sooner I discussed this with the patient he is in agreement with that. He is also going for his MRI today once he leaves the clinic here. Nonetheless I am concerned about his arterial status based on what we are seeing currently. 03/17/2019 on evaluation today patient appears to be doing poorly at this point in regard to his bilateral lower extremities. Unfortunately he is having new wounds show up each time I see him we have been seeing him on a weekly schedule. Subsequently he does have his arterial intervention for the right lower extremity scheduled for this coming Monday which is the 25th. Subsequently he has a second procedure actually scheduled for 2 February which is a Tuesday of the following week. He also is scheduled to see infectious disease on January 29 which is next Friday. All this is in place and good news. With that being said he has not heard anything from [facility] at this point as far as an appointment with Dr. Amalia Hailey in order to see about evaluation with regard to the surgical intervention and debridement. Obviously I think if he has a chance that maintaining and preventing amputation it is good to be with reestablishing good blood flow, controlling the infection that is currently present, and subsequently seeing Dr. Amalia Hailey for surgical debridement of the Achilles, bursa, and calcaneus region. Obviously good wound care plays a role in this but I do not believe that alone is good to be  enough for this patient. I also think hyperbarics could play a role but the other key factors need to be in place first before we can even consider hyperbarics. 03/24/2019 upon evaluation today patient appears to be doing roughly the same in regard to his wounds. Unfortunately he still has several areas that are just small eschar regions they are very superficial nonetheless they obviously appear to be arterial XAVION, MUSCAT. ([id number]) in nature. He has undergone vascular intervention in regard to his right lower extremity that was this past Monday with the vascular surgeon. He did have a stent placed in the upper portion of the arterial flow system and then subsequently 3 areas that he had balloon angioplasty distal to this. He states the surgeon was extremely happy when he told him how the procedure went with the improved flow in fact the patient's color in his foot seems to be doing significantly better in my opinion. He also tells me it feels better and is not cold all the time like it used to be as far as his perception is concerned. He actually has his procedure for the left lower extremity scheduled for this coming Tuesday. Unfortunately he still has not heard anything from podiatry. Apparently they did get a call but unfortunately when his son called back he was not able to get in touch with anyone. I did give him the information to get in touch  with him today if they will get in touch by tomorrow morning to give me a call and let me know so we can see what is going on. I know that [facility] has reached out to him trying to schedule an appointment unfortunately just sounds like there is been some phone tag going on. 03/31/2019 upon evaluation today patient actually appears to be doing quite a bit better in my opinion with regard to his wounds on his feet. He is slowly drying up as far as a lot of the eschar areas are concerned this is good news. He also did have his second  procedure on the left lower extremity with Dr. Lucky Cowboy. He did have popliteal stenosis which post procedure was 10% residual and have been greater close to 90% stenosed prior. His anterior tibial was also around 90% stenosed with a 15% residual post angioplasty. Overall he seems to be doing much better he states he can even feel that his foot is warmer and does not seem to cause him as much trouble as far as being cool to touch and overall uncomfortable. He is very pleased with how things have gone up to this point. The patient also did see Dr. Amalia Hailey who is a local podiatrist I referred him to he is going to undergo surgery to the right Achilles area and then subsequently with the surgery should be utilizing a wound VAC as long as everything goes according to plan. Obviously our goal is to avoid amputation at this point if at all possible. Electronic Signature(s) Signed: 03/31/2019 1:31:24 PM By: Worthy Keeler PA-C Entered By: Worthy Keeler on 03/31/2019 13:31:24 [place], [name] ([id number]) -------------------------------------------------------------------------------- Physical Exam Details Patient Name: BELL, [name]. Date of Service: 03/31/2019 12:30 PM Medical Record Number: [id number] Patient Account Number: [id number] Date of Birth/Sex: 10/15/1930 (86 y.o. M) Treating RN: Army Melia Primary Care Provider: Caryl Pina Other Clinician: Referring Provider: Caryl Pina Treating Provider/Extender: STONE III, Karagan Lehr Weeks in Treatment: 8 Constitutional Well-nourished and well-hydrated in no acute distress. Respiratory normal breathing without difficulty. Psychiatric this patient is able to make decisions and demonstrates good insight into disease process. Alert and Oriented x 3. pleasant and cooperative. Notes Upon inspection patient's wound bed actually showed signs of good granulation at this time. The patient is supposed to be getting a PICC line tomorrow and then  antibiotics IV will be initiated at that point. Electronic Signature(s) Signed: 03/31/2019 1:31:58 PM By: Worthy Keeler PA-C Entered By: Worthy Keeler on 03/31/2019 13:31:58 Primo, [name] ([id number]) -------------------------------------------------------------------------------- Physician Orders Details Patient Name: JONNATAN, HANNERS. Date of Service: 03/31/2019 12:30 PM Medical Record Number: [id number] Patient Account Number: [id number] Date of Birth/Sex: June 18, 1930 (86 y.o. M) Treating RN: Army Melia Primary Care Provider: Caryl Pina Other Clinician: Referring Provider: Caryl Pina Treating Provider/Extender: Melburn Hake, Saria Haran Weeks in Treatment: 8 Verbal / Phone Orders: No Diagnosis Coding ICD-10 Coding Code Description L89.610 Pressure ulcer of right heel, unstageable L89.890 Pressure ulcer of other site, unstageable E11.622 Type 2 diabetes mellitus with other skin ulcer L97.522 Non-pressure chronic ulcer of other part of left foot with fat layer exposed L97.512 Non-pressure chronic ulcer of other part of right foot with fat layer exposed I73.89 Other specified peripheral vascular diseases I25.10 Atherosclerotic heart disease of native coronary artery without angina pectoris I10 Essential (primary) hypertension N39.498 Other specified urinary incontinence Wound Cleansing Wound #1 Right Calcaneus o Clean wound with Normal Saline. - in office o Cleanse wound with mild  soap and water Primary Wound Dressing Wound #1 Right Calcaneus o Other: - betadine Wound #10 Right,Dorsal Foot o Other: - betadine Wound #11 Left Calcaneus o Other: - betadine Wound #12 Left Metatarsal head fifth o Other: - betadine Wound #13 Right,Plantar Foot o Other: - betadine Wound #14 Right,Lateral Malleolus o Other: - betadine Wound #2 Right Toe Fourth o Other: - betadine Wound #3 Right Toe Fifth o Other: - betadine KYMARI, NUON ([id number]) Wound #9  Right,Lateral Foot o Other: - betadine Wound #4 Left,Lateral Toe Third o Silver Alginate Wound #5 Left,Medial Toe Fourth o Silver Alginate Wound #6 Left,Lateral Toe Fourth o Silver Alginate Wound #7 Left,Medial Toe Fifth o Silver Alginate Wound #8 Right Achilles o Iodoflex Secondary Dressing Wound #1 Right Calcaneus o Dry Gauze o Conform/Kerlix o Other - heel cup Wound #10 Right,Dorsal Foot o Dry Gauze o Conform/Kerlix o Other - heel cup Wound #11 Left Calcaneus o Dry Gauze o Conform/Kerlix o Other - heel cup Wound #12 Left Metatarsal head fifth o Dry Gauze o Conform/Kerlix o Other - heel cup Wound #13 Right,Plantar Foot o Dry Gauze o Conform/Kerlix o Other - heel cup Wound #14 Right,Lateral Malleolus o Dry Gauze o Conform/Kerlix o Other - heel cup Wound #2 Right Toe Fourth o Dry Gauze o Conform/Kerlix o Other - heel cup Wound #3 Right Toe Fifth o Dry Gauze DELAINE, CANTER ([id number]) o Conform/Kerlix o Other - heel cup Wound #4 Left,Lateral Toe Third o Dry Gauze o Conform/Kerlix o Other - heel cup Wound #5 Left,Medial Toe Fourth o Dry Gauze o Conform/Kerlix o Other - heel cup Wound #6 Left,Lateral Toe Fourth o Dry Gauze o Conform/Kerlix o Other - heel cup Wound #7 Left,Medial Toe Fifth o Dry Gauze o Conform/Kerlix o Other - heel cup Wound #8 Right Achilles o Dry Gauze o Conform/Kerlix o Other - heel cup Wound #9 Right,Lateral Foot o Dry Gauze o Conform/Kerlix o Other - heel cup Dressing Change Frequency Wound #1 Right Calcaneus o Change Dressing Tuesday and Thursday. - Tuesday by [facility] Wound #10 Right,Dorsal Foot o Change Dressing Tuesday and Thursday. - Tuesday by [facility] Wound #11 Left Calcaneus o Change Dressing Tuesday and Thursday. - Tuesday by [facility] Wound #12 Left Metatarsal head fifth o Change Dressing Tuesday and Thursday. - Tuesday by  [facility] Wound #13 Right,Plantar Foot o Change Dressing Tuesday and Thursday. - Tuesday by [facility] Wound #14 Right,Lateral Malleolus o Change Dressing Tuesday and Thursday. - Tuesday by [facility] Wound #2 Right Toe Fourth o Change Dressing Tuesday and Thursday. - Tuesday by [facility] Wound #3 Right Toe Fifth o Change Dressing Tuesday and Thursday. - Tuesday by [facility] Kalish, [name] ([id number]) Wound #4 Left,Lateral Toe Third o Change Dressing Tuesday and Thursday. - Tuesday by [facility] Wound #5 Left,Medial Toe Fourth o Change Dressing Tuesday and Thursday. - Tuesday by [facility] Wound #6 Left,Lateral Toe Fourth o Change Dressing Tuesday and Thursday. - Tuesday by [facility] Wound #7 Left,Medial Toe Fifth o Change Dressing Tuesday and Thursday. - Tuesday by [facility] Wound #8 Right Achilles o Change Dressing Tuesday and Thursday. - Tuesday by [facility] Wound #9 Right,Lateral Foot o Change Dressing Tuesday and Thursday. - Tuesday by [facility] Follow-up Appointments Wound #1 Right Calcaneus o Return Appointment in 2 weeks. Wound #10 Right,Dorsal Foot o Return Appointment in 2 weeks. Wound #11 Left Calcaneus o Return Appointment in 2 weeks. Wound #12 Left Metatarsal head fifth o Return Appointment in 2 weeks. Wound #13 Right,Plantar Foot o Return Appointment in 2  weeks. Wound #14 Right,Lateral Malleolus o Return Appointment in 2 weeks. Wound #2 Right Toe Fourth o Return Appointment in 2 weeks. Wound #3 Right Toe Fifth o Return Appointment in 2 weeks. Wound #4 Left,Lateral Toe Third o Return Appointment in 2 weeks. Wound #5 Left,Medial Toe Fourth o Return Appointment in 2 weeks. Wound #6 Left,Lateral Toe Fourth o Return Appointment in 2 weeks. Wound #7 Left,Medial Toe Fifth o Return Appointment in 2 weeks. Wound #8 Right Achilles o Return Appointment in 2 weeks. JENIEL, [name] ([id number]) Wound #9 Right,Lateral Foot o Return Appointment in 2 weeks. Home Health Wound #1 Right  [place] Visits - [place] Nurse may visit PRN to address patientos wound care needs. o FACE TO FACE ENCOUNTER: MEDICARE and MEDICAID PATIENTS: I certify that this patient is under my care and that I had a face-to-face encounter that meets the physician face-to-face encounter requirements with this patient on this date. The encounter with the patient was in whole or in part for the following MEDICAL CONDITION: (primary reason for [place]) MEDICAL NECESSITY: I certify, that based on my findings, NURSING services are a medically necessary home health service. HOME BOUND STATUS: I certify that my clinical findings support that this patient is homebound (i.e., Due to illness or injury, pt requires aid of supportive devices such as crutches, cane, wheelchairs, walkers, the use of special transportation or the assistance of another person to leave their place of residence. There is a normal inability to leave the home and doing so requires considerable and taxing effort. Other absences are for medical reasons / religious services and are infrequent or of short duration when for other reasons). o If current dressing causes regression in wound condition, may D/C ordered dressing product/s and apply Normal Saline Moist Dressing daily until next [place] / Other MD appointment. [place] of regression in wound condition at [phone]. o Please direct any NON-WOUND related issues/requests for orders to patient's Primary Care Physician Wound #10 [place] Visits - [place] Nurse may visit PRN to address patientos wound care needs. o FACE TO FACE ENCOUNTER: MEDICARE and MEDICAID PATIENTS: I certify that this patient is under my care and that I had a face-to-face encounter that meets the physician face-to-face encounter requirements with this patient on this date. The  encounter with the patient was in whole or in part for the following MEDICAL CONDITION: (primary reason for [place]) MEDICAL NECESSITY: I certify, that based on my findings, NURSING services are a medically necessary home health service. HOME BOUND STATUS: I certify that my clinical findings support that this patient is homebound (i.e., Due to illness or injury, pt requires aid of supportive devices such as crutches, cane, wheelchairs, walkers, the use of special transportation or the assistance of another person to leave their place of residence. There is a normal inability to leave the home and doing so requires considerable and taxing effort. Other absences are for medical reasons / religious services and are infrequent or of short duration when for other reasons). o If current dressing causes regression in wound condition, may D/C ordered dressing product/s and apply Normal Saline Moist Dressing daily until next [place] / Other MD appointment. [place] of regression in wound condition at [phone]. o Please direct any NON-WOUND related issues/requests for orders to patient's Primary Care Physician Wound #11 Left [place] Visits -  [place] Nurse may visit PRN to address patientos wound care needs. o FACE TO FACE ENCOUNTER: MEDICARE and MEDICAID PATIENTS: I certify that this patient is under my care and that I had a face-to-face encounter that meets the physician face-to-face encounter requirements with this patient on this date. The encounter with the patient was in whole or in part for the following MEDICAL CONDITION: (primary reason for [place]) MEDICAL NECESSITY: I certify, that based on my findings, NURSING services are a medically necessary home health service. HOME BOUND STATUS: I certify that my clinical findings support that this patient is homebound (i.e., Due to illness or injury, pt  requires aid of supportive devices such as crutches, cane, wheelchairs, walkers, the use of special transportation or the assistance of another person to leave their place of residence. There is a normal inability to leave the home and doing so requires considerable and taxing effort. Other absences are for medical reasons / religious services and are infrequent or of short duration when for other reasons). o If current dressing causes regression in wound condition, may D/C ordered dressing product/s and apply Normal Saline Moist Dressing daily until next [place] / Other MD appointment. [place] of regression in wound condition at [phone]. o Please direct any NON-WOUND related issues/requests for orders to patient's Primary Care Physician DRAEDYN, WEIDINGER ([id number]) Wound #12 Left Metatarsal head fifth o [place] Visits - [place] Nurse may visit PRN to address patientos wound care needs. o FACE TO FACE ENCOUNTER: MEDICARE and MEDICAID PATIENTS: I certify that this patient is under my care and that I had a face-to-face encounter that meets the physician face-to-face encounter requirements with this patient on this date. The encounter with the patient was in whole or in part for the following MEDICAL CONDITION: (primary reason for [place]) MEDICAL NECESSITY: I certify, that based on my findings, NURSING services are a medically necessary home health service. HOME BOUND STATUS: I certify that my clinical findings support that this patient is homebound (i.e., Due to illness or injury, pt requires aid of supportive devices such as crutches, cane, wheelchairs, walkers, the use of special transportation or the assistance of another person to leave their place of residence. There is a normal inability to leave the home and doing so requires considerable and taxing effort. Other absences are for medical reasons /  religious services and are infrequent or of short duration when for other reasons). o If current dressing causes regression in wound condition, may D/C ordered dressing product/s and apply Normal Saline Moist Dressing daily until next [place] / Other MD appointment. [place] of regression in wound condition at [phone]. o Please direct any NON-WOUND related issues/requests for orders to patient's Primary Care Physician Wound #13 [place] Visits - [place] Nurse may visit PRN to address patientos wound care needs. o FACE TO FACE ENCOUNTER: MEDICARE and MEDICAID PATIENTS: I certify that this patient is under my care and that I had a face-to-face encounter that meets the physician face-to-face encounter requirements with this patient on this date. The encounter with the patient was in whole or in part for the following MEDICAL CONDITION: (primary reason for [place]) MEDICAL NECESSITY: I certify, that based on my findings, NURSING services are a medically necessary home health service. HOME BOUND STATUS: I certify that my clinical findings support that this patient is homebound (  i.e., Due to illness or injury, pt requires aid of supportive devices such as crutches, cane, wheelchairs, walkers, the use of special transportation or the assistance of another person to leave their place of residence. There is a normal inability to leave the home and doing so requires considerable and taxing effort. Other absences are for medical reasons / religious services and are infrequent or of short duration when for other reasons). o If current dressing causes regression in wound condition, may D/C ordered dressing product/s and apply Normal Saline Moist Dressing daily until next [place] / Other MD appointment. [place] of regression in wound condition at [phone]. o Please  direct any NON-WOUND related issues/requests for orders to patient's Primary Care Physician Wound #14 [place] Visits - [place] Nurse may visit PRN to address patientos wound care needs. o FACE TO FACE ENCOUNTER: MEDICARE and MEDICAID PATIENTS: I certify that this patient is under my care and that I had a face-to-face encounter that meets the physician face-to-face encounter requirements with this patient on this date. The encounter with the patient was in whole or in part for the following MEDICAL CONDITION: (primary reason for [place]) MEDICAL NECESSITY: I certify, that based on my findings, NURSING services are a medically necessary home health service. HOME BOUND STATUS: I certify that my clinical findings support that this patient is homebound (i.e., Due to illness or injury, pt requires aid of supportive devices such as crutches, cane, wheelchairs, walkers, the use of special transportation or the assistance of another person to leave their place of residence. There is a normal inability to leave the home and doing so requires considerable and taxing effort. Other absences are for medical reasons / religious services and are infrequent or of short duration when for other reasons). o If current dressing causes regression in wound condition, may D/C ordered dressing product/s and apply Normal Saline Moist Dressing daily until next [place] / Other MD appointment. [place] of regression in wound condition at [phone]. o Please direct any NON-WOUND related issues/requests for orders to patient's Primary Care Physician Wound #2 Right Toe [place] Visits - [place] Nurse may visit PRN to address patientos wound care needs. JAHZIER, VILLALON ([id number]) o FACE TO FACE ENCOUNTER: MEDICARE and MEDICAID PATIENTS: I certify that this patient is under my  care and that I had a face-to-face encounter that meets the physician face-to-face encounter requirements with this patient on this date. The encounter with the patient was in whole or in part for the following MEDICAL CONDITION: (primary reason for [place]) MEDICAL NECESSITY: I certify, that based on my findings, NURSING services are a medically necessary home health service. HOME BOUND STATUS: I certify that my clinical findings support that this patient is homebound (i.e., Due to illness or injury, pt requires aid of supportive devices such as crutches, cane, wheelchairs, walkers, the use of special transportation or the assistance of another person to leave their place of residence. There is a normal inability to leave the home and doing so requires considerable and taxing effort. Other absences are for medical reasons / religious services and are infrequent or of short duration when for other reasons). o If current dressing causes regression in wound condition, may D/C ordered dressing product/s and apply Normal Saline Moist Dressing daily until next [place] / Other MD appointment. [place] of  regression in wound condition at [phone]. o Please direct any NON-WOUND related issues/requests for orders to patient's Primary Care Physician Wound #3 Right Toe [place] Visits - [place] Nurse may visit PRN to address patientos wound care needs. o FACE TO FACE ENCOUNTER: MEDICARE and MEDICAID PATIENTS: I certify that this patient is under my care and that I had a face-to-face encounter that meets the physician face-to-face encounter requirements with this patient on this date. The encounter with the patient was in whole or in part for the following MEDICAL CONDITION: (primary reason for [place]) MEDICAL NECESSITY: I certify, that based on my findings, NURSING services are a medically necessary home  health service. HOME BOUND STATUS: I certify that my clinical findings support that this patient is homebound (i.e., Due to illness or injury, pt requires aid of supportive devices such as crutches, cane, wheelchairs, walkers, the use of special transportation or the assistance of another person to leave their place of residence. There is a normal inability to leave the home and doing so requires considerable and taxing effort. Other absences are for medical reasons / religious services and are infrequent or of short duration when for other reasons). o If current dressing causes regression in wound condition, may D/C ordered dressing product/s and apply Normal Saline Moist Dressing daily until next [place] / Other MD appointment. [place] of regression in wound condition at [phone]. o Please direct any NON-WOUND related issues/requests for orders to patient's Primary Care Physician Wound #4 Left,Lateral Toe [place] Visits - [place] Nurse may visit PRN to address patientos wound care needs. o FACE TO FACE ENCOUNTER: MEDICARE and MEDICAID PATIENTS: I certify that this patient is under my care and that I had a face-to-face encounter that meets the physician face-to-face encounter requirements with this patient on this date. The encounter with the patient was in whole or in part for the following MEDICAL CONDITION: (primary reason for [place]) MEDICAL NECESSITY: I certify, that based on my findings, NURSING services are a medically necessary home health service. HOME BOUND STATUS: I certify that my clinical findings support that this patient is homebound (i.e., Due to illness or injury, pt requires aid of supportive devices such as crutches, cane, wheelchairs, walkers, the use of special transportation or the assistance of another person to leave their place of residence. There is a normal inability to leave  the home and doing so requires considerable and taxing effort. Other absences are for medical reasons / religious services and are infrequent or of short duration when for other reasons). o If current dressing causes regression in wound condition, may D/C ordered dressing product/s and apply Normal Saline Moist Dressing daily until next [place] / Other MD appointment. [place] of regression in wound condition at [phone]. o Please direct any NON-WOUND related issues/requests for orders to patient's Primary Care Physician Wound #5 Left,Medial Toe [place] Visits - [place] Nurse may visit PRN to address patientos wound care needs. o FACE TO FACE ENCOUNTER: MEDICARE and MEDICAID PATIENTS: I certify that this patient is under my care and that I had a face-to-face encounter that meets the physician face-to-face encounter requirements with this patient on this date. The encounter with the patient was in whole or in part for the following MEDICAL CONDITION: (primary reason for [place]) MEDICAL NECESSITY: I certify, that based  on my findings, NURSING services are a medically necessary home health service. HOME BOUND STATUS: I certify that my JAWANN, URBANI ([id number]) clinical findings support that this patient is homebound (i.e., Due to illness or injury, pt requires aid of supportive devices such as crutches, cane, wheelchairs, walkers, the use of special transportation or the assistance of another person to leave their place of residence. There is a normal inability to leave the home and doing so requires considerable and taxing effort. Other absences are for medical reasons / religious services and are infrequent or of short duration when for other reasons). o If current dressing causes regression in wound condition, may D/C ordered dressing product/s and apply Normal Saline Moist Dressing daily until  next [place] / Other MD appointment. [place] of regression in wound condition at [phone]. o Please direct any NON-WOUND related issues/requests for orders to patient's Primary Care Physician Wound #6 Left,Lateral Toe [place] Visits - [place] Nurse may visit PRN to address patientos wound care needs. o FACE TO FACE ENCOUNTER: MEDICARE and MEDICAID PATIENTS: I certify that this patient is under my care and that I had a face-to-face encounter that meets the physician face-to-face encounter requirements with this patient on this date. The encounter with the patient was in whole or in part for the following MEDICAL CONDITION: (primary reason for [place]) MEDICAL NECESSITY: I certify, that based on my findings, NURSING services are a medically necessary home health service. HOME BOUND STATUS: I certify that my clinical findings support that this patient is homebound (i.e., Due to illness or injury, pt requires aid of supportive devices such as crutches, cane, wheelchairs, walkers, the use of special transportation or the assistance of another person to leave their place of residence. There is a normal inability to leave the home and doing so requires considerable and taxing effort. Other absences are for medical reasons / religious services and are infrequent or of short duration when for other reasons). o If current dressing causes regression in wound condition, may D/C ordered dressing product/s and apply Normal Saline Moist Dressing daily until next [place] / Other MD appointment. [place] of regression in wound condition at [phone]. o Please direct any NON-WOUND related issues/requests for orders to patient's Primary Care Physician Wound #7 Left,Medial Toe [place] Visits - [place] Nurse may visit PRN to address patientos wound  care needs. o FACE TO FACE ENCOUNTER: MEDICARE and MEDICAID PATIENTS: I certify that this patient is under my care and that I had a face-to-face encounter that meets the physician face-to-face encounter requirements with this patient on this date. The encounter with the patient was in whole or in part for the following MEDICAL CONDITION: (primary reason for [place]) MEDICAL NECESSITY: I certify, that based on my findings, NURSING services are a medically necessary home health service. HOME BOUND STATUS: I certify that my clinical findings support that this patient is homebound (i.e., Due to illness or injury, pt requires aid of supportive devices such as crutches, cane, wheelchairs, walkers, the use of special transportation or the assistance of another person to leave their place of residence. There is a normal inability to leave the home and doing so requires considerable and taxing effort. Other absences are for medical reasons / religious services and are infrequent or of short duration when for other reasons). o If current dressing causes regression in  wound condition, may D/C ordered dressing product/s and apply Normal Saline Moist Dressing daily until next [place] / Other MD appointment. [place] of regression in wound condition at [phone]. o Please direct any NON-WOUND related issues/requests for orders to patient's Primary Care Physician Wound #8 Right Achilles o [place] Visits - [place] Nurse may visit PRN to address patientos wound care needs. o FACE TO FACE ENCOUNTER: MEDICARE and MEDICAID PATIENTS: I certify that this patient is under my care and that I had a face-to-face encounter that meets the physician face-to-face encounter requirements with this patient on this date. The encounter with the patient was in whole or in part for the following MEDICAL CONDITION: (primary reason for [place])  MEDICAL NECESSITY: I certify, that based on my findings, NURSING services are a medically necessary home health service. HOME BOUND STATUS: I certify that my clinical findings support that this patient is homebound (i.e., Due to illness or injury, pt requires aid of supportive devices such as crutches, cane, wheelchairs, walkers, the use of special transportation or the assistance of another person to leave their place of residence. There is a normal inability to leave the home and doing so requires considerable and taxing effort. Other absences are for medical reasons / religious services and are infrequent or of short duration when for other reasons). DEREKE, NEUMANN ([id number]) o If current dressing causes regression in wound condition, may D/C ordered dressing product/s and apply Normal Saline Moist Dressing daily until next [place] / Other MD appointment. [place] of regression in wound condition at [phone]. o Please direct any NON-WOUND related issues/requests for orders to patient's Primary Care Physician Wound #9 [place] Visits - [place] Nurse may visit PRN to address patientos wound care needs. o FACE TO FACE ENCOUNTER: MEDICARE and MEDICAID PATIENTS: I certify that this patient is under my care and that I had a face-to-face encounter that meets the physician face-to-face encounter requirements with this patient on this date. The encounter with the patient was in whole or in part for the following MEDICAL CONDITION: (primary reason for [place]) MEDICAL NECESSITY: I certify, that based on my findings, NURSING services are a medically necessary home health service. HOME BOUND STATUS: I certify that my clinical findings support that this patient is homebound (i.e., Due to illness or injury, pt requires aid of supportive devices such as crutches, cane, wheelchairs, walkers, the use of  special transportation or the assistance of another person to leave their place of residence. There is a normal inability to leave the home and doing so requires considerable and taxing effort. Other absences are for medical reasons / religious services and are infrequent or of short duration when for other reasons). o If current dressing causes regression in wound condition, may D/C ordered dressing product/s and apply Normal Saline Moist Dressing daily until next [place] / Other MD appointment. [place] of regression in wound condition at [phone]. o Please direct any NON-WOUND related issues/requests for orders to patient's Primary Care Physician Electronic Signature(s) Signed: 03/31/2019 4:32:43 PM By: Army Melia Signed: 03/31/2019 5:05:52 PM By: Worthy Keeler PA-C Entered By: Army Melia on 03/31/2019 13:19:10 Magowan, [name] ([id number]) -------------------------------------------------------------------------------- Problem List Details Patient Name: TERUO, STILLEY. Date of Service: 03/31/2019 12:30 PM Medical Record Number: [id number] Patient Account Number: [id number] Date of Birth/Sex: [birth year] (86 y.o. M) Treating  RN: Army Melia Primary Care Provider: Caryl Pina Other Clinician: Referring Provider: Caryl Pina Treating Provider/Extender: Melburn Hake, Kavir [place] Weeks in Treatment: 8 Active Problems ICD-10 Evaluated Encounter Code Description Active Date Today Diagnosis L89.610 Pressure ulcer of right heel, unstageable 02/03/2019 No Yes L89.890 Pressure ulcer of other site, unstageable 02/03/2019 No Yes E11.622 Type 2 diabetes mellitus with other skin ulcer 03/03/2019 No Yes L97.522 Non-pressure chronic ulcer of other part of left foot with fat 03/03/2019 No Yes layer exposed L97.512 Non-pressure chronic ulcer of other part of right foot with fat 03/03/2019 No Yes layer exposed I73.89 Other specified peripheral vascular diseases  03/03/2019 No Yes I25.10 Atherosclerotic heart disease of native coronary artery 02/03/2019 No Yes without angina pectoris I10 Essential (primary) hypertension 02/03/2019 No Yes N39.498 Other specified urinary incontinence 02/03/2019 No Yes Inactive Problems SIGFREDO, SCHREIER ([id number]) Resolved Problems Electronic Signature(s) Signed: 03/31/2019 12:59:53 PM By: Worthy Keeler PA-C Entered By: Worthy Keeler on 03/31/2019 12:59:52 Danley, [name] ([id number]) -------------------------------------------------------------------------------- Progress Note Details Patient Name: Jacqualyn Posey. Date of Service: 03/31/2019 12:30 PM Medical Record Number: [id number] Patient Account Number: [id number] Date of Birth/Sex: [birth year] (86 y.o. M) Treating RN: Army Melia Primary Care Provider: Caryl Pina Other Clinician: Referring Provider: Caryl Pina Treating Provider/Extender: Melburn Hake, Jazz Biddy Weeks in Treatment: 8 Subjective Chief Complaint Information obtained from Patient Heel pressure ulcer and right 4th and 5th toe pressure ulcers along with multiple left toe ulcers History of Present Illness (HPI) 02/03/2019 patient presents today for initial evaluation in our clinic concerning issues that he has been having with wounds on his right heel as well as the right fourth and fifth toes. The toe ulcers have been present for about 5 months. They are pretty much just dry and eschar covered at this point but appear to be stable. The heel ulcer has been present for about 2 months and is very soft the eschar is loosening and there appears to be purulent drainage as well noted at this point there is also erythema surrounding the wound bed. I am unsure if this is just inflammation or secondary to infection to be perfectly honest. With that being said the patient has recently gotten Prevalon offloading boots and he is essentially nonambulatory at this point. He did have a coronary artery  bypass graft x4 in 1989. Otherwise he tells me that he also has hypertension and urinary incontinence. Obviously that is not playing any role in his wounds currently based on what we are seeing today. 02/10/2019 on evaluation today patient appears to be doing quite well with regard to his wounds. He is not showing a lot of signs of improvement yet but he still has a lot of eschar at this point. He still awaiting the vascular evaluation at this time as well. Fortunately there is no signs of active infection. No fevers, chills, nausea, vomiting, or diarrhea. 02/17/2019 on evaluation today patient appears to be doing quite well with regard to his wound. This seems to be greatly improved compared to last visit I do believe that the cefdinir is doing well for him. Fortunately there is no signs of active infection at this time. No fever chills noted no fevers, chills, nausea, vomiting, or diarrhea. 03/03/2019 on evaluation today patient appears to be doing unfortunately worse even compared to his last visit. His wounds have not progressed in a good way and unfortunately he has multiple new locations on the left foot as well that are giving him trouble. I am obviously concerned  about how things are progressing at this point. I did actually have results to review for him today. This included his arterial study which revealed that his ABIs were not obtainable and subsequently his TBI's were very low in the 0.35-0.30 range bilaterally. Subsequently his x-ray also revealed that there are potential changes on the x-ray at the heel on the right that could be associated with osteomyelitis. Nonetheless I do believe that the patient does get a likely require a vascular consult as well as an MRI to further evaluate the situation. In the meantime I am also going to recommend refilling the Omnicef to continue treatment as such since that does seem to have been at least somewhat beneficial for him. 03/10/2019 upon  evaluation today patient appears to be doing unfortunately a little bit worse in regard to new areas of what appear to be necrotic tissue showing up at multiple places on his lower extremity. I am very concerned about this to be honest. In fact he has an appointment next Thursday with vascular for evaluation. I really want to see if we try to get this moved up sooner I discussed this with the patient he is in agreement with that. He is also going for his MRI today once he leaves the clinic here. Nonetheless I am concerned about his arterial status based on what we are seeing currently. 03/17/2019 on evaluation today patient appears to be doing poorly at this point in regard to his bilateral lower extremities. Unfortunately he is having new wounds show up each time I see him we have been seeing him on a weekly schedule. Subsequently he does have his arterial intervention for the right lower extremity scheduled for this coming Monday which is the 25th. Subsequently he has a second procedure actually scheduled for 2 February which is a Tuesday of the following week. He also is scheduled to see infectious disease on January 29 which is next Friday. All this is in place and good news. With that being said he has not heard anything from [facility] at this point as far as an appointment with Dr. Amalia Hailey in order to see about evaluation with regard to the surgical intervention and debridement. Obviously I think if he has a chance that maintaining and preventing amputation it is good to be with reestablishing good blood flow, controlling the infection that is currently present, and subsequently seeing Dr. Amalia Hailey for surgical debridement of the Achilles, bursa, and calcaneus region. MATTIX, IMHOF ([id number]) Obviously good wound care plays a role in this but I do not believe that alone is good to be enough for this patient. I also think hyperbarics could play a role but the other key factors need to be in  place first before we can even consider hyperbarics. 03/24/2019 upon evaluation today patient appears to be doing roughly the same in regard to his wounds. Unfortunately he still has several areas that are just small eschar regions they are very superficial nonetheless they obviously appear to be arterial in nature. He has undergone vascular intervention in regard to his right lower extremity that was this past Monday with the vascular surgeon. He did have a stent placed in the upper portion of the arterial flow system and then subsequently 3 areas that he had balloon angioplasty distal to this. He states the surgeon was extremely happy when he told him how the procedure went with the improved flow in fact the patient's color in his foot seems to be doing significantly better  in my opinion. He also tells me it feels better and is not cold all the time like it used to be as far as his perception is concerned. He actually has his procedure for the left lower extremity scheduled for this coming Tuesday. Unfortunately he still has not heard anything from podiatry. Apparently they did get a call but unfortunately when his son called back he was not able to get in touch with anyone. I did give him the information to get in touch with him today if they will get in touch by tomorrow morning to give me a call and let me know so we can see what is going on. I know that [facility] has reached out to him trying to schedule an appointment unfortunately just sounds like there is been some phone tag going on. 03/31/2019 upon evaluation today patient actually appears to be doing quite a bit better in my opinion with regard to his wounds on his feet. He is slowly drying up as far as a lot of the eschar areas are concerned this is good news. He also did have his second procedure on the left lower extremity with Dr. Lucky Cowboy. 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 His anterior tibial was also around 90% stenosed with a 15% residual post angioplasty. Overall he seems to be doing much better he states he can even feel that his foot is warmer and does not seem to cause him as much trouble as far as being cool to touch and overall uncomfortable. He is very pleased with how things have gone up to this point. The patient also did see Dr. Amalia Hailey who is a local podiatrist I referred him to he is going to undergo surgery to the right Achilles area and then subsequently with the surgery should be utilizing a wound VAC as long as everything goes according to plan. Obviously our goal is to avoid amputation at this point if at all possible. Objective Constitutional Well-nourished and well-hydrated in no acute distress. Vitals Time Taken: 12:40 PM, Height: 68 in, Weight: 235 lbs, BMI: 35.7, Temperature: 97.7 F, Pulse: 70 bpm, Respiratory Rate: 16 breaths/min, Blood Pressure: 114/60 mmHg. Respiratory normal breathing without difficulty. Psychiatric this patient is able to make decisions and demonstrates good insight into disease process. Alert and Oriented x 3. pleasant and cooperative. General Notes: Upon inspection patient's wound bed actually showed signs of good granulation at this time. The patient is supposed to be getting a PICC line tomorrow and then antibiotics IV will be initiated at that point. Integumentary (Hair, Skin) Wound #1 status is Open. Original cause of wound was Gradually Appeared. The wound is located on the Right Calcaneus. The wound measures 4.3cm length x 4.5cm width x 0.1cm depth; 15.197cm^2 area and 1.52cm^3 volume. The wound is limited to skin breakdown. There is no tunneling or undermining noted. There is a none present amount of drainage noted. There is no granulation within the wound bed. There is a large (67-100%) amount of necrotic tissue within the wound bed HITESH, FOUCHE. ([id number]) including  Eschar. Wound #10 status is Open. Original cause of wound was Gradually Appeared. The wound is located on the Right,Dorsal Foot. The wound measures 6cm length x 0.9cm width x 0.1cm depth; 4.241cm^2 area and 0.424cm^3 volume. The wound is limited to skin breakdown. There is no tunneling or undermining noted. There is a none present amount of drainage  noted. There is no granulation within the wound bed. There is a large (67-100%) amount of necrotic tissue within the wound bed including Eschar. Wound #11 status is Open. Original cause of wound was Gradually Appeared. The wound is located on the Left Calcaneus. The wound measures 2.01cm length x 3cm width x 0.1cm depth; 4.736cm^2 area and 0.474cm^3 volume. The wound is limited to skin breakdown. There is no tunneling or undermining noted. There is a none present amount of drainage noted. There is no granulation within the wound bed. There is a large (67-100%) amount of necrotic tissue within the wound bed including Eschar. Wound #12 status is Open. Original cause of wound was Gradually Appeared. The wound is located on the Left Metatarsal head fifth. The wound measures 1.7cm length x 0.8cm width x 0.1cm depth; 1.068cm^2 area and 0.107cm^3 volume. The wound is limited to skin breakdown. There is no tunneling or undermining noted. There is a none present amount of drainage noted. There is no granulation within the wound bed. There is a large (67-100%) amount of necrotic tissue within the wound bed including Eschar. Wound #13 status is Open. Original cause of wound was Gradually Appeared. The wound is located on the [place]. The wound measures 2.2cm length x 2.9cm width x 0.1cm depth; 5.011cm^2 area and 0.501cm^3 volume. The wound is limited to skin breakdown. There is no tunneling or undermining noted. There is a none present amount of drainage noted. There is no granulation within the wound bed. There is a large (67-100%) amount of necrotic  tissue within the wound bed including Eschar. Wound #14 status is Open. Original cause of wound was Gradually Appeared. The wound is located on the Right,Lateral Malleolus. The wound measures 0.8cm length x 1.4cm width x 0.1cm depth; 0.88cm^2 area and 0.088cm^3 volume. The wound is limited to skin breakdown. There is no tunneling or undermining noted. There is a none present amount of drainage noted. The wound margin is indistinct and nonvisible. There is no granulation within the wound bed. There is a large (67- 100%) amount of necrotic tissue within the wound bed including Eschar. Wound #2 status is Open. Original cause of wound was Gradually Appeared. The wound is located on the Right Toe Fourth. The wound measures 0.1cm length x 0.2cm width x 0.1cm depth; 0.016cm^2 area and 0.002cm^3 volume. The wound is limited to skin breakdown. There is no tunneling or undermining noted. There is a none present amount of drainage noted. There is no granulation within the wound bed. There is a large (67-100%) amount of necrotic tissue within the wound bed including Eschar. Wound #3 status is Open. Original cause of wound was Pressure Injury. The wound is located on the Right Toe Fifth. The wound measures 0.7cm length x 0.8cm width x 0.1cm depth; 0.44cm^2 area and 0.044cm^3 volume. The wound is limited to skin breakdown. There is no tunneling or undermining noted. There is a none present amount of drainage noted. There is no granulation within the wound bed. There is a large (67-100%) amount of necrotic tissue within the wound bed including Eschar. Wound #4 status is Open. Original cause of wound was Gradually Appeared. The wound is located on the Left,Lateral Toe Third. The wound measures 1.5cm length x 0.9cm width x 0.1cm depth; 1.06cm^2 area and 0.106cm^3 volume. The wound is limited to skin breakdown. There is no tunneling or undermining noted. There is a none present amount of drainage noted. There is  no granulation within the wound bed. There is  a large (67-100%) amount of necrotic tissue within the wound bed including Eschar. Wound #5 status is Open. Original cause of wound was Gradually Appeared. The wound is located on the Left,Medial Toe Fourth. The wound measures 0.4cm length x 0.3cm width x 0.1cm depth; 0.094cm^2 area and 0.009cm^3 volume. There is Fat Layer (Subcutaneous Tissue) Exposed exposed. There is no tunneling or undermining noted. There is a medium amount of serous drainage noted. The wound margin is flat and intact. There is medium (34-66%) pink granulation within the wound bed. There is a medium (34-66%) amount of necrotic tissue within the wound bed including Adherent Slough. Wound #6 status is Open. Original cause of wound was Gradually Appeared. The wound is located on the Left,Lateral Toe Fourth. The wound measures 1.2cm length x 1cm width x 0.3cm depth; 0.942cm^2 area and 0.283cm^3 volume. There is Fat Layer (Subcutaneous Tissue) Exposed exposed. There is no tunneling or undermining noted. There is a medium amount of serous drainage noted. The wound margin is flat and intact. There is no granulation within the wound bed. There is a large MAXIMUS, HOFFERT. ([phone]) (67-100%) amount of necrotic tissue within the wound bed including Eschar and Adherent Slough. Wound #7 status is Open. Original cause of wound was Gradually Appeared. The wound is located on the Left,Medial Toe Fifth. The wound measures 1.7cm length x 0.8cm width x 0.1cm depth; 1.068cm^2 area and 0.107cm^3 volume. The wound is limited to skin breakdown. There is no tunneling or undermining noted. There is a none present amount of drainage noted. The wound margin is flat and intact. There is no granulation within the wound bed. There is a large (67-100%) amount of necrotic tissue within the wound bed including Eschar. Wound #8 status is Open. Original cause of wound was Gradually Appeared. The wound is located  on the Right Achilles. The wound measures 7.1cm length x 4.1cm width x 0.7cm depth; 22.863cm^2 area and 16.004cm^3 volume. There is Fat Layer (Subcutaneous Tissue) Exposed exposed. There is no tunneling or undermining noted. There is a medium amount of purulent drainage noted. The wound margin is flat and intact. There is no granulation within the wound bed. There is a large (67-100%) amount of necrotic tissue within the wound bed including Eschar and Adherent Slough. Wound #9 status is Open. Original cause of wound was Gradually Appeared. The wound is located on the Right,Lateral Foot. The wound measures 1.6cm length x 1.4cm width x 0.1cm depth; 1.759cm^2 area and 0.176cm^3 volume. The wound is limited to skin breakdown. There is no tunneling or undermining noted. There is a none present amount of drainage noted. There is no granulation within the wound bed. There is a large (67-100%) amount of necrotic tissue within the wound bed including Eschar. 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 - in office Cleanse wound with mild soap and water Primary Wound Dressing: Wound #1 Right Calcaneus: Other: - betadine Wound #10 Right,Dorsal Foot: Other: - betadine Wound #11 Left Calcaneus: Other: - betadine Wound #12 Left Metatarsal head fifth: Other: - betadine Nasser, Melbert  J. ([id number]) Wound #13 Right,Plantar Foot: Other: - betadine Wound  #14 Right,Lateral Malleolus: Other: - betadine Wound #2 Right Toe Fourth: Other: - betadine Wound #3 Right Toe Fifth: Other: - betadine Wound #9 Right,Lateral Foot: Other: - betadine Wound #4 Left,Lateral Toe Third: [place] Wound #5 Left,Medial Toe Fourth: [place] Wound #6 Left,Lateral Toe Fourth: [place] Wound #7 Left,Medial Toe Fifth: [place] Wound #8 Right Achilles: Iodoflex Secondary Dressing: Wound #1 Right Calcaneus: Dry Gauze Conform/Kerlix Other - heel cup Wound #10 Right,Dorsal Foot: Dry Gauze Conform/Kerlix Other - heel cup Wound #11 Left Calcaneus: Dry Gauze Conform/Kerlix Other - heel cup Wound #12 Left Metatarsal head fifth: Dry Gauze Conform/Kerlix Other - heel cup Wound #13 Right,Plantar Foot: Dry Gauze Conform/Kerlix Other - heel cup Wound #14 Right,Lateral Malleolus: Dry Gauze Conform/Kerlix Other - heel cup Wound #2 Right Toe Fourth: Dry Gauze Conform/Kerlix Other - heel cup Wound #3 Right Toe Fifth: Dry Gauze Conform/Kerlix Other - heel cup Wound #4 Left,Lateral Toe Third: Dry Gauze Conform/Kerlix Other - heel cup Wound #5 Left,Medial Toe Fourth: Dry Gauze Conform/Kerlix ITAMAR, MCGOWAN ([id number]) Other - heel cup Wound #6 Left,Lateral Toe Fourth: Dry Gauze Conform/Kerlix Other - heel cup Wound #7 Left,Medial Toe Fifth: Dry Gauze Conform/Kerlix Other - heel cup Wound #8 Right Achilles: Dry Gauze Conform/Kerlix Other - heel cup Wound #9 Right,Lateral Foot: Dry Gauze Conform/Kerlix Other - heel cup Dressing Change Frequency: Wound #1 Right Calcaneus: Change Dressing Tuesday and Thursday. - Tuesday by [facility] Wound #10 Right,Dorsal Foot: Change Dressing Tuesday and Thursday. - Tuesday by [facility] Wound #11 Left Calcaneus: Change Dressing Tuesday and Thursday. - Tuesday by [facility] Wound #12 Left Metatarsal head fifth: Change Dressing Tuesday and Thursday. - Tuesday by [facility] Wound #13 Right,Plantar  Foot: Change Dressing Tuesday and Thursday. - Tuesday by [facility] Wound #14 Right,Lateral Malleolus: Change Dressing Tuesday and Thursday. - Tuesday by [facility] Wound #2 Right Toe Fourth: Change Dressing Tuesday and Thursday. - Tuesday by [facility] Wound #3 Right Toe Fifth: Change Dressing Tuesday and Thursday. - Tuesday by [facility] Wound #4 Left,Lateral Toe Third: Change Dressing Tuesday and Thursday. - Tuesday by [facility] Wound #5 Left,Medial Toe Fourth: Change Dressing Tuesday and Thursday. - Tuesday by [facility] Wound #6 Left,Lateral Toe Fourth: Change Dressing Tuesday and Thursday. - Tuesday by [facility] Wound #7 Left,Medial Toe Fifth: Change Dressing Tuesday and Thursday. - Tuesday by [facility] Wound #8 Right Achilles: Change Dressing Tuesday and Thursday. - Tuesday by [facility] Wound #9 Right,Lateral Foot: Change Dressing Tuesday and Thursday. - Tuesday by [facility] Follow-up Appointments: Wound #1 Right Calcaneus: Return Appointment in 2 weeks. Wound #10 Right,Dorsal Foot: Return Appointment in 2 weeks. Wound #11 Left Calcaneus: Return Appointment in 2 weeks. Wound #12 Left Metatarsal head fifth: Return Appointment in 2 weeks. Wound #13 Right,Plantar Foot: Return Appointment in 2 weeks. Wound #14 Right,Lateral Malleolus: Return Appointment in 2 weeks. Wound #2 Right Toe Fourth: KYSEAN, SWEET ([id number]) Return Appointment in 2 weeks. Wound #3 Right Toe Fifth: Return Appointment in 2 weeks. Wound #4 Left,Lateral Toe Third: Return Appointment in 2 weeks. Wound #5 Left,Medial Toe Fourth: Return Appointment in 2 weeks. Wound #6 Left,Lateral Toe Fourth: Return Appointment in 2 weeks. Wound #7 Left,Medial Toe Fifth: Return Appointment in 2 weeks. Wound #8 Right Achilles: Return Appointment in 2 weeks. Wound #9 Right,Lateral Foot: Return Appointment in 2 weeks. Home Health: Wound #1 Right Calcaneus: Continue Home Health Visits - [place] Nurse may visit PRN to address patient s wound care needs.  FACE TO FACE  ENCOUNTER: MEDICARE and MEDICAID PATIENTS: I certify that this patient is under my care and that I had a face-to-face encounter that meets the physician face-to-face encounter requirements with this patient on this date. The encounter with the patient was in whole or in part for the following MEDICAL CONDITION: (primary reason for [place]) MEDICAL NECESSITY: I certify, that based on my findings, NURSING services are a medically necessary home health service. HOME BOUND STATUS: I certify that my clinical findings support that this patient is homebound (i.e., Due to illness or injury, pt requires aid of supportive devices such as crutches, cane, wheelchairs, walkers, the use of special transportation or the assistance of another person to leave their place of residence. There is a normal inability to leave the home and doing so requires considerable and taxing effort. Other absences are for medical reasons / religious services and are infrequent or of short duration when for other reasons). If current dressing causes regression in wound condition, may D/C ordered dressing product/s and apply Normal Saline Moist Dressing daily until next [place] / Other MD appointment. [place] of regression in wound condition at [phone]. Please direct any NON-WOUND related issues/requests for orders to patient's Primary Care Physician Wound #10 Right,Dorsal Foot: [place] Visits - [place] Nurse may visit PRN to address patient s wound care needs. FACE TO FACE ENCOUNTER: MEDICARE and MEDICAID PATIENTS: I certify that this patient is under my care and that I had a face-to-face encounter that meets the physician face-to-face encounter requirements with this patient on this date. The encounter with the patient was in whole or in part for the following MEDICAL CONDITION: (primary reason for [place]) MEDICAL NECESSITY: I certify, that based on  my findings, NURSING services are a medically necessary home health service. HOME BOUND STATUS: I certify that my clinical findings support that this patient is homebound (i.e., Due to illness or injury, pt requires aid of supportive devices such as crutches, cane, wheelchairs, walkers, the use of special transportation or the assistance of another person to leave their place of residence. There is a normal inability to leave the home and doing so requires considerable and taxing effort. Other absences are for medical reasons / religious services and are infrequent or of short duration when for other reasons). If current dressing causes regression in wound condition, may D/C ordered dressing product/s and apply Normal Saline Moist Dressing daily until next [place] / Other MD appointment. [place] of regression in wound condition at [phone]. Please direct any NON-WOUND related issues/requests for orders to patient's Primary Care Physician Wound #11 Left Calcaneus: [place] Visits - [place] Nurse may visit PRN to address patient s wound care needs. FACE TO FACE ENCOUNTER: MEDICARE and MEDICAID PATIENTS: I certify that this patient is under my care and that I had a face-to-face encounter that meets the physician face-to-face encounter requirements with this patient on this date. The encounter with the patient was in whole or in part for the following MEDICAL CONDITION: (primary reason for [place]) MEDICAL NECESSITY: I certify, that based on my findings, NURSING services are a medically necessary home health service. HOME BOUND STATUS: I certify that my clinical findings support that this patient is homebound (i.e., Due to illness or injury, pt requires aid of supportive devices such as crutches, cane, wheelchairs, walkers, the use of special transportation or the assistance of another person  to leave their place of residence. There  is a normal inability to leave the home and doing so requires considerable and taxing effort. Other absences are for medical reasons / religious services and are infrequent or of short duration when for other reasons). NORMAND, DAMRON ([id number]) If current dressing causes regression in wound condition, may D/C ordered dressing product/s and apply Normal Saline Moist Dressing daily until next [place] / Other MD appointment. [place] of regression in wound condition at [phone]. Please direct any NON-WOUND related issues/requests for orders to patient's Primary Care Physician Wound #12 Left Metatarsal head fifth: [place] Nurse may visit PRN to address patient s wound care needs. FACE TO FACE ENCOUNTER: MEDICARE and MEDICAID PATIENTS: I certify that this patient is under my care and that I had a face-to-face encounter that meets the physician face-to-face encounter requirements with this patient on this date. The encounter with the patient was in whole or in part for the following MEDICAL CONDITION: (primary reason for [place]) MEDICAL NECESSITY: I certify, that based on my findings, NURSING services are a medically necessary home health service. HOME BOUND STATUS: I certify that my clinical findings support that this patient is homebound (i.e., Due to illness or injury, pt requires aid of supportive devices such as crutches, cane, wheelchairs, walkers, the use of special transportation or the assistance of another person to leave their place of residence. There is a normal inability to leave the home and doing so requires considerable and taxing effort. Other absences are for medical reasons / religious services and are infrequent or of short duration when for other reasons). If current dressing causes regression in wound condition, may D/C ordered dressing product/s and apply Normal Saline Moist Dressing  daily until next [place] / Other MD appointment. [place] of regression in wound condition at [phone]. Please direct any NON-WOUND related issues/requests for orders to patient's Primary Care Physician Wound #13 Right,Plantar Foot: [place] Visits - [place] Nurse may visit PRN to address patient s wound care needs. FACE TO FACE ENCOUNTER: MEDICARE and MEDICAID PATIENTS: I certify that this patient is under my care and that I had a face-to-face encounter that meets the physician face-to-face encounter requirements with this patient on this date. The encounter with the patient was in whole or in part for the following MEDICAL CONDITION: (primary reason for [place]) MEDICAL NECESSITY: I certify, that based on my findings, NURSING services are a medically necessary home health service. HOME BOUND STATUS: I certify that my clinical findings support that this patient is homebound (i.e., Due to illness or injury, pt requires aid of supportive devices such as crutches, cane, wheelchairs, walkers, the use of special transportation or the assistance of another person to leave their place of residence. There is a normal inability to leave the home and doing so requires considerable and taxing effort. Other absences are for medical reasons / religious services and are infrequent or of short duration when for other reasons). If current dressing causes regression in wound condition, may D/C ordered dressing product/s and apply Normal Saline Moist Dressing daily until next [place] / Other MD appointment. [place] of regression in wound condition at [phone]. Please direct any NON-WOUND related issues/requests for orders to patient's Primary Care Physician Wound #14 Right,Lateral Malleolus: [place] Visits - [place] Nurse may visit PRN to address  patient s wound care needs. FACE TO  FACE ENCOUNTER: MEDICARE and MEDICAID PATIENTS: I certify that this patient is under my care and that I had a face-to-face encounter that meets the physician face-to-face encounter requirements with this patient on this date. The encounter with the patient was in whole or in part for the following MEDICAL CONDITION: (primary reason for [place]) MEDICAL NECESSITY: I certify, that based on my findings, NURSING services are a medically necessary home health service. HOME BOUND STATUS: I certify that my clinical findings support that this patient is homebound (i.e., Due to illness or injury, pt requires aid of supportive devices such as crutches, cane, wheelchairs, walkers, the use of special transportation or the assistance of another person to leave their place of residence. There is a normal inability to leave the home and doing so requires considerable and taxing effort. Other absences are for medical reasons / religious services and are infrequent or of short duration when for other reasons). If current dressing causes regression in wound condition, may D/C ordered dressing product/s and apply Normal Saline Moist Dressing daily until next [place] / Other MD appointment. [place] of regression in wound condition at [phone]. Please direct any NON-WOUND related issues/requests for orders to patient's Primary Care Physician Wound #2 Right Toe Fourth: [place] Visits - [place] Nurse may visit PRN to address patient s wound care needs. FACE TO FACE ENCOUNTER: MEDICARE and MEDICAID PATIENTS: I certify that this patient is under my care and that I had a face-to-face encounter that meets the physician face-to-face encounter requirements with this patient on this date. The encounter with the patient was in whole or in part for the following MEDICAL CONDITION: (primary reason for [place]) MEDICAL NECESSITY: I certify, that based  on my findings, NURSING services are a medically necessary home health service. HOME BOUND STATUS: I certify that my clinical findings support that this patient is homebound (i.e., Due to VERLYN, LAMBERT ([id number]) illness or injury, pt requires aid of supportive devices such as crutches, cane, wheelchairs, walkers, the use of special transportation or the assistance of another person to leave their place of residence. There is a normal inability to leave the home and doing so requires considerable and taxing effort. Other absences are for medical reasons / religious services and are infrequent or of short duration when for other reasons). If current dressing causes regression in wound condition, may D/C ordered dressing product/s and apply Normal Saline Moist Dressing daily until next [place] / Other MD appointment. [place] of regression in wound condition at [phone]. Please direct any NON-WOUND related issues/requests for orders to patient's Primary Care Physician Wound #3 Right Toe Fifth: [place] Visits - [place] Nurse may visit PRN to address patient s wound care needs. FACE TO FACE ENCOUNTER: MEDICARE and MEDICAID PATIENTS: I certify that this patient is under my care and that I had a face-to-face encounter that meets the physician face-to-face encounter requirements with this patient on this date. The encounter with the patient was in whole or in part for the following MEDICAL CONDITION: (primary reason for [place]) MEDICAL NECESSITY: I certify, that based on my findings, NURSING services are a medically necessary home health service. HOME BOUND STATUS: I certify that my clinical findings support that this patient is homebound (i.e., Due to illness or injury, pt requires aid of supportive devices such as crutches, cane, wheelchairs, walkers,  the use of special transportation or the assistance of another person to  leave their place of residence. There is a normal inability to leave the home and doing so requires considerable and taxing effort. Other absences are for medical reasons / religious services and are infrequent or of short duration when for other reasons). If current dressing causes regression in wound condition, may D/C ordered dressing product/s and apply Normal Saline Moist Dressing daily until next [employer] / Other MD appointment. [place] of regression in wound condition at [phone]. Please direct any NON-WOUND related issues/requests for orders to patient's Primary Care Physician Wound #4 Left,Lateral Toe Third: [place] Visits - [place] Nurse may visit PRN to address patient s wound care needs. FACE TO FACE ENCOUNTER: MEDICARE and MEDICAID PATIENTS: I certify that this patient is under my care and that I had a face-to-face encounter that meets the physician face-to-face encounter requirements with this patient on this date. The encounter with the patient was in whole or in part for the following MEDICAL CONDITION: (primary reason for [place]) MEDICAL NECESSITY: I certify, that based on my findings, NURSING services are a medically necessary home health service. HOME BOUND STATUS: I certify that my clinical findings support that this patient is homebound (i.e., Due to illness or injury, pt requires aid of supportive devices such as crutches, cane, wheelchairs, walkers, the use of special transportation or the assistance of another person to leave their place of residence. There is a normal inability to leave the home and doing so requires considerable and taxing effort. Other absences are for medical reasons / religious services and are infrequent or of short duration when for other reasons). If current dressing causes regression in wound condition, may D/C ordered dressing product/s and apply Normal Saline Moist Dressing  daily until next [place] / Other MD appointment. [place] of regression in wound condition at [phone]. Please direct any NON-WOUND related issues/requests for orders to patient's Primary Care Physician Wound #5 Left,Medial Toe Fourth: [place] [facility] - [place] Nurse may visit PRN to address patient s wound care needs. FACE TO FACE ENCOUNTER: MEDICARE and MEDICAID PATIENTS: I certify that this patient is under my care and that I had a face-to-face encounter that meets the physician face-to-face encounter requirements with this patient on this date. The encounter with the patient was in whole or in part for the following MEDICAL CONDITION: (primary reason for [place]) MEDICAL NECESSITY: I certify, that based on my findings, NURSING services are a medically necessary home health service. HOME BOUND STATUS: I certify that my clinical findings support that this patient is homebound (i.e., Due to illness or injury, pt requires aid of supportive devices such as crutches, cane, wheelchairs, walkers, the use of special transportation or the assistance of another person to leave their place of residence. There is a normal inability to leave the home and doing so requires considerable and taxing effort. Other absences are for medical reasons / religious services and are infrequent or of short duration when for other reasons). If current dressing causes regression in wound condition, may D/C ordered dressing product/s and apply Normal Saline Moist Dressing daily until next [place] / Other MD appointment. [place] of regression in wound condition at [phone]. Please direct any NON-WOUND related issues/requests for orders to patient's Primary Care Physician Wound #6 Left,Lateral Toe Fourth: [place] [facility] - [facility]  [facility] Nurse may visit PRN to address patient s wound care needs. FACE  TO FACE ENCOUNTER: MEDICARE and MEDICAID PATIENTS: I certify that this patient is under my care and that I BRENTEN, JANNEY ([id number]) had a face-to-face encounter that meets the physician face-to-face encounter requirements with this patient on this date. The encounter with the patient was in whole or in part for the following MEDICAL CONDITION: (primary reason for [place]) MEDICAL NECESSITY: I certify, that based on my findings, NURSING services are a medically necessary home health service. HOME BOUND STATUS: I certify that my clinical findings support that this patient is homebound (i.e., Due to illness or injury, pt requires aid of supportive devices such as crutches, cane, wheelchairs, walkers, the use of special transportation or the assistance of another person to leave their place of residence. There is a normal inability to leave the home and doing so requires considerable and taxing effort. Other absences are for medical reasons / religious services and are infrequent or of short duration when for other reasons). If current dressing causes regression in wound condition, may D/C ordered dressing product/s and apply Normal Saline Moist Dressing daily until next [place] / Other MD appointment. [place] of regression in wound condition at [phone]. Please direct any NON-WOUND related issues/requests for orders to patient's Primary Care Physician Wound #7 Left,Medial Toe Fifth: [place] [facility] - [place] Nurse may visit PRN to address patient s wound care needs. FACE TO FACE ENCOUNTER: MEDICARE and MEDICAID PATIENTS: I certify that this patient is under my care and that I had a face-to-face encounter that meets the physician face-to-face encounter requirements with this patient on this date. The encounter with the patient was in whole or in part for the following MEDICAL CONDITION: (primary reason for [place])  MEDICAL NECESSITY: I certify, that based on my findings, NURSING services are a medically necessary home health service. HOME BOUND STATUS: I certify that my clinical findings support that this patient is homebound (i.e., Due to illness or injury, pt requires aid of supportive devices such as crutches, cane, wheelchairs, walkers, the use of special transportation or the assistance of another person to leave their place of residence. There is a normal inability to leave the home and doing so requires considerable and taxing effort. Other absences are for medical reasons / religious services and are infrequent or of short duration when for other reasons). If current dressing causes regression in wound condition, may D/C ordered dressing product/s and apply Normal Saline Moist Dressing daily until next [place] / Other MD appointment. [place] of regression in wound condition at [phone]. Please direct any NON-WOUND related issues/requests for orders to patient's Primary Care Physician Wound #8 Right Achilles: [place] [facility] - [place] Nurse may visit PRN to address patient s wound care needs. FACE TO FACE ENCOUNTER: MEDICARE and MEDICAID PATIENTS: I certify that this patient is under my care and that I had a face-to-face encounter that meets the physician face-to-face encounter requirements with this patient on this date. The encounter with the patient was in whole or in part for the following MEDICAL CONDITION: (primary reason for [place]) MEDICAL NECESSITY: I certify, that based on my findings, NURSING services are a medically necessary home health service. HOME BOUND STATUS: I certify that my clinical findings support that this patient is homebound (i.e., Due to illness or injury, pt requires aid of supportive  devices such as crutches, cane, wheelchairs, walkers, the use of special transportation or the assistance of another  person to leave their place of residence. There is a normal inability to leave the home and doing so requires considerable and taxing effort. Other absences are for medical reasons / religious services and are infrequent or of short duration when for other reasons). If current dressing causes regression in wound condition, may D/C ordered dressing product/s and apply Normal Saline Moist Dressing daily until next [place] / Other MD appointment. [place] of regression in wound condition at [phone]. Please direct any NON-WOUND related issues/requests for orders to patient's Primary Care Physician Wound #9 Right,Lateral Foot: [place] [facility] - [place] Nurse may visit PRN to address patient s wound care needs. FACE TO FACE ENCOUNTER: MEDICARE and MEDICAID PATIENTS: I certify that this patient is under my care and that I had a face-to-face encounter that meets the physician face-to-face encounter requirements with this patient on this date. The encounter with the patient was in whole or in part for the following MEDICAL CONDITION: (primary reason for [place]) MEDICAL NECESSITY: I certify, that based on my findings, NURSING services are a medically necessary home health service. HOME BOUND STATUS: I certify that my clinical findings support that this patient is homebound (i.e., Due to illness or injury, pt requires aid of supportive devices such as crutches, cane, wheelchairs, walkers, the use of special transportation or the assistance of another person to leave their place of residence. There is a normal inability to leave the home and doing so requires considerable and taxing effort. Other absences are for medical reasons / religious services and are infrequent or of short duration when for other reasons). If current dressing causes regression in wound condition, may D/C ordered dressing product/s and apply Normal Saline Moist  Dressing daily until next [place] / Other MD appointment. [place] of regression in wound condition at [phone]. Please direct any NON-WOUND related issues/requests for orders to patient's Primary Care Physician ALBARAA, SWINGLE ([id number]) 1. At this point I would recommend that we continue with Betadine to most wound locations we will utilize silver alginate between the third and fourth toe on the left and we will use Iodoflex for the Achilles region. 2. We will secure everything with ABD pads and Kerlix, 3. I am also can recommend that we will see him in 2 weeks simply due to the fact that he will be having surgery next Friday who has to be quarantined prior and we will proceed from there. He will be seeing Dr. Amalia Hailey for surgical debridement of the Achilles region and then subsequently placement of a wound VAC along with a skin sub-I'm not sure which when he is planning to use. 4. I do recommend continued offloading I think that still important for the patient at this time as well. 5. She will also have a PICC line placed tomorrow and IV antibiotics initiated by Dr. Amalia Hailey at that point as well. We will see patient back for reevaluation in 1 week here in the clinic. If anything worsens or changes patient will contact our office for additional recommendations. Electronic Signature(s) Signed: 03/31/2019 1:33:16 PM By: Worthy Keeler PA-C Entered By: Worthy Keeler on 03/31/2019 13:33:15 [place], [name] ([id number]) -------------------------------------------------------------------------------- SuperBill Details Patient Name: JAHLIL, [name]. Date of Service: 03/31/2019 Medical Record Number: [id number] Patient Account Number: [id number] Date of Birth/Sex: 03/27/1930 (86 y.o. M)  Treating RN: Army Melia Primary Care Provider: Caryl Pina Other Clinician: Referring Provider: Caryl Pina Treating Provider/Extender: Melburn Hake, Taisa Deloria Weeks in  Treatment: 8 Diagnosis Coding ICD-10 Codes Code Description L89.610 Pressure ulcer of right heel, unstageable L89.890 Pressure ulcer of other site, unstageable E11.622 Type 2 diabetes mellitus with other skin ulcer L97.522 Non-pressure chronic ulcer of other part of left foot with fat layer exposed L97.512 Non-pressure chronic ulcer of other part of right foot with fat layer exposed I73.89 Other specified peripheral vascular diseases I25.10 Atherosclerotic heart disease of native coronary artery without angina pectoris I10 Essential (primary) hypertension N39.498 Other specified urinary incontinence Physician Procedures CPT4 Code: [id number] Description: 99214 - WC PHYS LEVEL 4 - EST PT ICD-10 Diagnosis Description L89.610 Pressure ulcer of right heel, unstageable L89.890 Pressure ulcer of other site, unstageable E11.622 Type 2 diabetes mellitus with other skin ulcer L97.522 Non-pressure  chronic ulcer of other part of left foot with fat Modifier: layer exposed Quantity: 1 Electronic Signature(s) Signed: 03/31/2019 1:33:32 PM By: Worthy Keeler PA-C Entered By: Worthy Keeler on 03/31/2019 13:33:30

## 2019-03-31 NOTE — Progress Notes (Signed)
Ch met with son of pt. Pt shared concerns of taking care of elderly parents. Ch provided mi   03/31/19 1240  Clinical Encounter Type  Visited With Family  Visit Type Follow-up  Referral From Family  Consult/Referral To Chaplain  nistry of presence.

## 2019-03-31 NOTE — Progress Notes (Signed)
ADIR, SCHICKER ([id number]) Visit Report for 03/31/2019 Arrival Information Details Patient Name: [name], [name]. Date of Service: 03/31/2019 12:30 PM Medical Record Number: [id number] Patient Account Number: [id number] Date of Birth/Sex: [birth year] (86 y.o. M) Treating RN: Army Melia Primary Care Rachael Zapanta: Caryl Pina Other Clinician: Referring Malvina Schadler: Caryl Pina Treating Enolia Koepke/Extender: Melburn Hake, [name]: 8 Visit Information History Since Last Visit Added or deleted any medications: No Patient Arrived: Wheel Chair Any new allergies or adverse reactions: No Arrival Time: 12:42 Had a fall or experienced change in No Accompanied By: son activities of daily living that may affect Transfer Assistance: None risk of falls: Patient Has Alerts: Yes Signs or symptoms of abuse/neglect since last visito No Patient Alerts: Patient on Blood Thinner Hospitalized since last visit: No Implantable device outside of the clinic excluding No cellular tissue based products placed in the center since last visit: Has Dressing in Place as Prescribed: Yes Pain Present Now: No Electronic Signature(s) Signed: 03/31/2019 4:28:06 PM By: Lorine Bears RCP, RRT, CHT Entered By: Lorine Bears on 03/31/2019 12:43:18 Lant, [name] ([id number]) -------------------------------------------------------------------------------- Clinic Level of Care Assessment Details Patient Name: [name], [name]. Date of Service: 03/31/2019 12:30 PM Medical Record Number: [id number] Patient Account Number: [id number] Date of Birth/Sex: December 12, 1930 (86 y.o. M) Treating RN: Army Melia Primary Care Abriel Geesey: Caryl Pina Other Clinician: Referring Beyonce Sawatzky: Caryl Pina Treating Remberto Lienhard/Extender: Melburn Hake, [name]: 8 Clinic Level of Care Assessment Items TOOL 4 Quantity Score []  - Use when only an EandM is performed on FOLLOW-UP visit  0 ASSESSMENTS - Nursing Assessment / Reassessment X - Reassessment of Co-morbidities (includes updates in patient status) 1 10 X- 1 5 Reassessment of Adherence to Treatment Plan ASSESSMENTS - Wound and Skin Assessment / Reassessment []  - Simple Wound Assessment / Reassessment - one wound 0 X- 14 5 Complex Wound Assessment / Reassessment - multiple wounds []  - 0 Dermatologic / Skin Assessment (not related to wound area) ASSESSMENTS - Focused Assessment []  - Circumferential Edema Measurements - multi extremities 0 []  - 0 Nutritional Assessment / Counseling / Intervention []  - 0 Lower Extremity Assessment (monofilament, tuning fork, pulses) []  - 0 Peripheral Arterial Disease Assessment (using hand held doppler) ASSESSMENTS - Ostomy and/or Continence Assessment and Care []  - Incontinence Assessment and Management 0 []  - 0 Ostomy Care Assessment and Management (repouching, etc.) PROCESS - Coordination of Care X - Simple Patient / Family Education for ongoing care 1 15 []  - 0 Complex (extensive) Patient / Family Education for ongoing care []  - 0 Staff obtains Programmer, systems, Records, Test Results / Process Orders []  - 0 Staff telephones HHA, Nursing Homes / Clarify orders / etc []  - 0 Routine Transfer to another Facility (non-emergent condition) []  - 0 Routine Hospital Admission (non-emergent condition) []  - 0 New Admissions / Biomedical engineer / Ordering NPWT, Apligraf, etc. []  - 0 Emergency Hospital Admission (emergent condition) X- 1 10 Simple Discharge Coordination LALO, TROMP ([id number]) []  - 0 Complex (extensive) Discharge Coordination PROCESS - Special Needs []  - Pediatric / Minor Patient Management 0 []  - 0 Isolation Patient Management []  - 0 Hearing / Language / Visual special needs []  - 0 Assessment of Community assistance (transportation, D/C planning, etc.) []  - 0 Additional assistance / Altered mentation []  - 0 Support Surface(s) Assessment (bed,  cushion, seat, etc.) INTERVENTIONS - Wound Cleansing / Measurement []  - Simple Wound Cleansing - one wound 0 X- 14 5 Complex Wound Cleansing - multiple wounds  X- 1 5 Wound Imaging (photographs - any number of wounds) []  - 0 Wound Tracing (instead of photographs) []  - 0 Simple Wound Measurement - one wound X- 14 5 Complex Wound Measurement - multiple wounds INTERVENTIONS - Wound Dressings []  - Small Wound Dressing one or multiple wounds 0 X- 14 15 Medium Wound Dressing one or multiple wounds []  - 0 Large Wound Dressing one or multiple wounds []  - 0 Application of Medications - topical []  - 0 Application of Medications - injection INTERVENTIONS - Miscellaneous []  - External ear exam 0 []  - 0 Specimen Collection (cultures, biopsies, blood, body fluids, etc.) []  - 0 Specimen(s) / Culture(s) sent or taken to Lab for analysis []  - 0 Patient Transfer (multiple staff / Civil Service fast streamer / Similar devices) []  - 0 Simple Staple / Suture removal (25 or less) []  - 0 Complex Staple / Suture removal (26 or more) []  - 0 Hypo / Hyperglycemic Management (close monitor of Blood Glucose) []  - 0 Ankle / Brachial Index (ABI) - do not check if billed separately X- 1 5 Vital Signs Fratus, [name]. ([id number]) Has the patient been seen at the hospital within the last three years: Yes Total Score: 470 Level Of Care: New/Established - Level 5 Electronic Signature(s) Signed: 03/31/2019 4:32:43 PM By: Army Melia Entered By: Army Melia on 03/31/2019 13:20:14 Bouton, [name] ([id number]) -------------------------------------------------------------------------------- Encounter Discharge Information Details Patient Name: [name], [name]. Date of Service: 03/31/2019 12:30 PM Medical Record Number: [id number] Patient Account Number: [id number] Date of Birth/Sex: [birth year] (86 y.o. M) Treating RN: Army Melia Primary Care Sharae Zappulla: Caryl Pina Other Clinician: Referring Shatori Bertucci: Caryl Pina Treating Karessa Onorato/Extender: Melburn Hake, [name]: 8 Encounter Discharge Information Items Discharge Condition: Stable Ambulatory Status: Wheelchair Discharge Destination: Home Transportation: Private Auto Accompanied By: family Schedule Follow-up Appointment: Yes Clinical Summary of Care: Electronic Signature(s) Signed: 03/31/2019 4:32:43 PM By: Army Melia Entered By: Army Melia on 03/31/2019 13:22:28 Filippi, [name] ([id number]) -------------------------------------------------------------------------------- Lower Extremity Assessment Details Patient Name: [name], [name]. Date of Service: 03/31/2019 12:30 PM Medical Record Number: [id number] Patient Account Number: [id number] Date of Birth/Sex: 14-Jan-1931 (86 y.o. M) Treating RN: Montey Hora Primary Care Jaivian Battaglini: Caryl Pina Other Clinician: Referring Brysyn Brandenberger: Caryl Pina Treating Mathilde Mcwherter/Extender: STONE III, [name]: 8 Edema Assessment Assessed: [Left: No] [Right: No] Edema: [Left: No] [Right: No] Vascular Assessment Pulses: Dorsalis Pedis Palpable: [Left:Yes] [Right:Yes] Posterior Tibial Palpable: [Left:Yes] [Right:Yes] Electronic Signature(s) Signed: 03/31/2019 4:59:12 PM By: Montey Hora Entered By: Montey Hora on 03/31/2019 13:04:16 Buerkle, [name] ([id number]) -------------------------------------------------------------------------------- Multi Wound Chart Details Patient Name: [name], [name]. Date of Service: 03/31/2019 12:30 PM Medical Record Number: [id number] Patient Account Number: [id number] Date of Birth/Sex: Oct 09, 1930 (86 y.o. M) Treating RN: Army Melia Primary Care Chanay Nugent: Caryl Pina Other Clinician: Referring Ross Hefferan: Caryl Pina Treating Santi Troung/Extender: Melburn Hake, [name]: 8 Vital Signs Height(in): 68 Pulse(bpm): 70 Weight(lbs): 235 Blood Pressure(mmHg): 114/60 Body Mass Index(BMI):  36 Temperature(F): 97.7 Respiratory Rate 16 (breaths/min): Photos: Wound Location: Right Calcaneus Right Foot - Dorsal Left Calcaneus Wounding Event: Gradually Appeared Gradually Appeared Gradually Appeared Primary Etiology: Pressure Ulcer Arterial Insufficiency Ulcer Arterial Insufficiency Ulcer Comorbid History: Cataracts, Hypertension, Cataracts, Hypertension, Cataracts, Hypertension, Myocardial Infarction, Type II Myocardial Infarction, Type II Myocardial Infarction, Type II Diabetes, Osteoarthritis, Diabetes, Osteoarthritis, Diabetes, Osteoarthritis, Neuropathy Neuropathy Neuropathy Date Acquired: 12/05/2018 02/08/2019 02/08/2019 Weeks of Treatment: 8 2 2  Wound Status: Open Open Open Measurements L x W x D 4.3x4.5x0.1 6x0.9x0.1 2.01x3x0.1 (cm) Area (  cm) : 15.197 4.241 4.736 Volume (cm) : 1.52 0.424 0.474 % Reduction in Area: -116.00% -184.20% -0.50% % Reduction in Volume: 56.80% -184.60% -0.60% Classification: Unstageable/Unclassified Unclassifiable Unclassifiable Exudate Amount: None Present None Present None Present Exudate Type: N/A N/A N/A Exudate Color: N/A N/A N/A Wound Margin: N/A N/A N/A Granulation Amount: None Present (0%) None Present (0%) None Present (0%) Granulation Quality: N/A N/A N/A Necrotic Amount: Large (67-100%) Large (67-100%) Large (67-100%) Necrotic Tissue: Eschar Eschar Eschar Exposed Structures: Fascia: No Fascia: No Fascia: No Fat Layer (Subcutaneous Fat Layer (Subcutaneous Fat Layer (Subcutaneous Tissue) Exposed: No Tissue) Exposed: No Tissue) Exposed: No Tendon: No Tendon: No Tendon: No [name], [name] ([id number]) Muscle: No Muscle: No Muscle: No Joint: No Joint: No Joint: No Bone: No Bone: No Bone: No Limited to Skin Breakdown Limited to Skin Breakdown Limited to Skin Breakdown Epithelialization: None None None Wound Number: [id number]  Photos: Wound Location: Left Metatarsal head fifth Right Foot - Plantar Right Malleolus  - Lateral Wounding Event: Gradually Appeared Gradually Appeared Gradually Appeared Primary Etiology: Arterial Insufficiency Ulcer Arterial Insufficiency Ulcer Arterial Insufficiency Ulcer Comorbid History: Cataracts, Hypertension, Cataracts, Hypertension, Cataracts, Hypertension, Myocardial Infarction, Type II Myocardial Infarction, Type II Myocardial Infarction, Type II Diabetes, Osteoarthritis, Diabetes, Osteoarthritis, Diabetes, Osteoarthritis, Neuropathy Neuropathy Neuropathy Date Acquired: 02/08/2019 03/21/2019 03/21/2019 Weeks of Treatment: 2 1 1  Wound Status: Open Open Open Measurements L x W x D 1.7x0.8x0.1 2.2x2.9x0.1 0.8x1.4x0.1 (cm) Area (cm) : 1.068 5.011 0.88 Volume (cm) : 0.107 0.501 0.088 % Reduction in Area: -126.80% 8.90% -12.10% % Reduction in Volume: -127.70% 8.90% -11.40% Classification: Unclassifiable Unclassifiable Unclassifiable Exudate Amount: None Present None Present None Present Exudate Type: N/A N/A N/A Exudate Color: N/A N/A N/A Wound Margin: N/A N/A Indistinct, nonvisible Granulation Amount: None Present (0%) None Present (0%) None Present (0%) Granulation Quality: N/A N/A N/A Necrotic Amount: Large (67-100%) Large (67-100%) Large (67-100%) Necrotic Tissue: Eschar Eschar Eschar Exposed Structures: Fascia: No Fascia: No Fascia: No Fat Layer (Subcutaneous Fat Layer (Subcutaneous Fat Layer (Subcutaneous Tissue) Exposed: No Tissue) Exposed: No Tissue) Exposed: No Tendon: No Tendon: No Tendon: No Muscle: No Muscle: No Muscle: No Joint: No Joint: No Joint: No Bone: No Bone: No Bone: No Limited to Skin Breakdown Limited to Skin Breakdown Limited to Skin Breakdown Epithelialization: None None None Wound Number: 2 3 4  Photos: [name], [name] ([id number]) Wound Location: Right Toe Fourth Right Toe Fifth Left Toe Third - Lateral Wounding Event: Gradually Appeared Pressure Injury Gradually Appeared Primary Etiology: Pressure Ulcer Pressure Ulcer  Diabetic Wound/Ulcer of the Lower Extremity Comorbid History: Cataracts, Hypertension, Cataracts, Hypertension, Cataracts, Hypertension, Myocardial Infarction, Type II Myocardial Infarction, Type II Myocardial Infarction, Type II Diabetes, Osteoarthritis, Diabetes, Osteoarthritis, Diabetes, Osteoarthritis, Neuropathy Neuropathy Neuropathy Date Acquired: 09/05/2018 09/05/2018 03/03/2019 Weeks of Treatment: 8 8 4  Wound Status: Open Open Open Measurements L x W x D 0.1x0.2x0.1 0.7x0.8x0.1 1.5x0.9x0.1 (cm) Area (cm) : 0.016 0.44 1.06 Volume (cm) : 0.002 0.044 0.106 % Reduction in Area: 83.00% -40.10% -1027.70% % Reduction in Volume: 77.80% -41.90% -1077.80% Classification: Unstageable/Unclassified Unstageable/Unclassified Grade 1 Exudate Amount: None Present None Present None Present Exudate Type: N/A N/A N/A Exudate Color: N/A N/A N/A Wound Margin: N/A N/A N/A Granulation Amount: None Present (0%) None Present (0%) None Present (0%) Granulation Quality: N/A N/A N/A Necrotic Amount: Large (67-100%) Large (67-100%) Large (67-100%) Necrotic Tissue: Eschar Eschar Eschar Exposed Structures: Fascia: No Fascia: No Fascia: No Fat Layer (Subcutaneous Fat Layer (Subcutaneous Fat Layer (Subcutaneous Tissue) Exposed: No Tissue) Exposed: No  Tissue) Exposed: No Tendon: No Tendon: No Tendon: No Muscle: No Muscle: No Muscle: No Joint: No Joint: No Joint: No Bone: No Bone: No Bone: No Limited to Skin Breakdown Limited to Skin Breakdown Limited to Skin Breakdown Epithelialization: None None None Wound Number: 5 6 7  Photos: Wound Location: Left, Medial Toe Fourth Left Toe Fourth - Lateral Left Toe Fifth - Medial Wounding Event: Gradually Appeared Gradually Appeared Gradually Appeared Primary Etiology: Diabetic Wound/Ulcer of the Diabetic Wound/Ulcer of the Diabetic Wound/Ulcer of the Lower Extremity Lower Extremity Lower Extremity Comorbid History: Cataracts, Hypertension, Cataracts,  Hypertension, Cataracts, Hypertension, Myocardial Infarction, Type II Myocardial Infarction, Type II Myocardial Infarction, Type II Diabetes, Osteoarthritis, Diabetes, Osteoarthritis, Diabetes, Osteoarthritis, Neuropathy Neuropathy Neuropathy Date Acquired: 03/03/2019 03/03/2019 03/03/2019 Weeks of Treatment: 4 4 4  [name], [name] ([id number]) Wound Status: Open Open Open Measurements L x W x D 0.4x0.3x0.1 1.2x1x0.3 1.7x0.8x0.1 (cm) Area (cm) : 0.094 0.942 1.068 Volume (cm) : 0.009 0.283 0.107 % Reduction in Area: 43.00% 23.10% -3.00% % Reduction in Volume: 43.80% -15.50% -2.90% Classification: Grade 1 Grade 1 Grade 1 Exudate Amount: Medium Medium None Present Exudate Type: Serous Serous N/A Exudate Color: amber amber N/A Wound Margin: Flat and Intact Flat and Intact Flat and Intact Granulation Amount: Medium (34-66%) None Present (0%) None Present (0%) Granulation Quality: Pink N/A N/A Necrotic Amount: Medium (34-66%) Large (67-100%) Large (67-100%) Necrotic Tissue: Adherent Slough Eschar, Adherent Slough Eschar Exposed Structures: Fat Layer (Subcutaneous Fat Layer (Subcutaneous Fascia: No Tissue) Exposed: Yes Tissue) Exposed: Yes Fat Layer (Subcutaneous Fascia: No Fascia: No Tissue) Exposed: No Tendon: No Tendon: No Tendon: No Muscle: No Muscle: No Muscle: No Joint: No Joint: No Joint: No Bone: No Bone: No Bone: No Limited to Skin Breakdown Epithelialization: None None None Wound Number: [id number] N/A Photos: N/A Wound Location: Right Achilles Right Foot - Lateral N/A Wounding Event: Gradually Appeared Gradually Appeared N/A Primary Etiology: Arterial Insufficiency Ulcer Arterial Insufficiency Ulcer N/A Comorbid History: Cataracts, Hypertension, Cataracts, Hypertension, N/A Myocardial Infarction, Type II Myocardial Infarction, Type II Diabetes, Osteoarthritis, Diabetes, Osteoarthritis, Neuropathy Neuropathy Date Acquired: 02/08/2019 02/08/2019 N/A Weeks of Treatment: 2  2 N/A Wound Status: Open Open N/A Measurements L x W x D 7.1x4.1x0.7 1.6x1.4x0.1 N/A (cm) Area (cm) : 22.863 1.759 N/A Volume (cm) : 16.004 0.176 N/A % Reduction in Area: 35.30% 12.20% N/A % Reduction in Volume: 35.30% 12.00% N/A Classification: Full Thickness Without Unclassifiable N/A Exposed Support Structures Exudate Amount: Medium None Present N/A Exudate Type: Purulent N/A N/A Exudate Color: yellow, brown, green N/A N/A Wound Margin: Flat and Intact N/A N/A [name], [name] ([id number]) Granulation Amount: None Present (0%) None Present (0%) N/A Granulation Quality: N/A N/A N/A Necrotic Amount: Large (67-100%) Large (67-100%) N/A Necrotic Tissue: Eschar, Adherent Slough Eschar N/A Exposed Structures: Fat Layer (Subcutaneous Fascia: No N/A Tissue) Exposed: Yes Fat Layer (Subcutaneous Fascia: No Tissue) Exposed: No Tendon: No Tendon: No Muscle: No Muscle: No Joint: No Joint: No Bone: No Bone: No Limited to Skin Breakdown Epithelialization: None None N/A Treatment Notes Electronic Signature(s) Signed: 03/31/2019 4:32:43 PM By: Army Melia Entered By: Army Melia on 03/31/2019 13:13:35 Bauder, [name] ([id number]) -------------------------------------------------------------------------------- Multi-Disciplinary Care Plan Details Patient Name: NIVAAN, DICENZO. Date of Service: 03/31/2019 12:30 PM Medical Record Number: [id number] Patient Account Number: [id number] Date of Birth/Sex: 04/16/1930 (86 y.o. M) Treating RN: Army Melia Primary Care Anylah Scheib: Caryl Pina Other Clinician: Referring Atha Muradyan: Caryl Pina Treating Tianne Plott/Extender: STONE III, [name]: 8 Active Inactive Pressure Nursing Diagnoses: Knowledge  deficit related to causes and risk factors for pressure ulcer development Goals: Patient/caregiver will verbalize risk factors for pressure ulcer development Date Initiated: 02/03/2019 Target Resolution Date:  03/04/2019 Goal Status: Active Interventions: Assess: immobility, friction, shearing, incontinence upon admission and as needed Notes: Wound/Skin Impairment Nursing Diagnoses: Impaired tissue integrity Goals: Ulcer/skin breakdown will have a volume reduction of 30% by week 4 Date Initiated: 02/03/2019 Target Resolution Date: 03/04/2019 Goal Status: Active Interventions: Assess ulceration(s) every visit Notes: Electronic Signature(s) Signed: 03/31/2019 4:32:43 PM By: Army Melia Entered By: Army Melia on 03/31/2019 13:13:16 Alejos, [name] ([id number]) -------------------------------------------------------------------------------- Pain Assessment Details Patient Name: [name]. Date of Service: 03/31/2019 12:30 PM Medical Record Number: [id number] Patient Account Number: [id number] Date of Birth/Sex: 1930/08/28 (86 y.o. M) Treating RN: Army Melia Primary Care Steele Ledonne: Caryl Pina Other Clinician: Referring Nacole Fluhr: Caryl Pina Treating Yasmina Chico/Extender: Melburn Hake, [name]: 8 Active Problems Location of Pain Severity and Description of Pain Patient Has Paino No Site Locations Pain Management and Medication Current Pain Management: Electronic Signature(s) Signed: 03/31/2019 4:28:06 PM By: Paulla Fore, RRT, CHT Signed: 03/31/2019 4:32:43 PM By: Army Melia Entered By: Lorine Bears on 03/31/2019 12:43:24 Lobosco, [name] ([id number]) -------------------------------------------------------------------------------- Patient/Caregiver Education Details Patient Name: YORDAN, MARTINDALE. Date of Service: 03/31/2019 12:30 PM Medical Record Number: [id number] Patient Account Number: [id number] Date of Birth/Gender: 1931/02/24 (86 y.o. M) Treating RN: Army Melia Primary Care Physician: Caryl Pina Other Clinician: Referring Physician: Caryl Pina Treating Physician/Extender: Sharalyn Ink in  Treatment: 8 Education Assessment Education Provided To: Patient Education Topics Provided Wound/Skin Impairment: Handouts: Caring for Your Ulcer Methods: Demonstration, Explain/Verbal Responses: State content correctly Electronic Signature(s) Signed: 03/31/2019 4:32:43 PM By: Army Melia Entered By: Army Melia on 03/31/2019 13:20:31 Viveiros, [name] ([id number]) -------------------------------------------------------------------------------- Wound Assessment Details Patient Name: [name], [name]. Date of Service: 03/31/2019 12:30 PM Medical Record Number: [id number] Patient Account Number: [id number] Date of Birth/Sex: 1930-06-21 (86 y.o. M) Treating RN: Montey Hora Primary Care Monta Police: Caryl Pina Other Clinician: Referring Kinsley Holderman: Caryl Pina Treating Moussa Wiegand/Extender: Melburn Hake, [name]: 8 Wound Status Wound Number: 1 Primary Pressure Ulcer Etiology: Wound Location: Right Calcaneus Wound Open Wounding Event: Gradually Appeared Status: Date Acquired: 12/05/2018 Comorbid Cataracts, Hypertension, Myocardial Infarction, Weeks Of Treatment: 8 History: Type II Diabetes, Osteoarthritis, Neuropathy Clustered Wound: No Photos Photo Uploaded By: Montey Hora on 03/31/2019 13:06:43 Wound Measurements Length: (cm) 4.3 Width: (cm) 4.5 Depth: (cm) 0.1 Area: (cm) 15.197 Volume: (cm) 1.52 % Reduction in Area: -116% % Reduction in Volume: 56.8% Epithelialization: None Tunneling: No Undermining: No Wound Description Classification: Unstageable/Unclassified Exudate Amount: None Present Foul Odor After Cleansing: No Slough/Fibrino No Wound Bed Granulation Amount: None Present (0%) Exposed Structure Necrotic Amount: Large (67-100%) Fascia Exposed: No Necrotic Quality: Eschar Fat Layer (Subcutaneous Tissue) Exposed: No Tendon Exposed: No Muscle Exposed: No Joint Exposed: No Bone Exposed: No Limited to Skin Breakdown Treatment  Notes Wound #1 (Right Calcaneus) RORIK, VESPA. ([id number]) Notes betadine, gauze, conform Electronic Signature(s) Signed: 03/31/2019 4:59:12 PM By: Montey Hora Entered By: Montey Hora on 03/31/2019 12:55:33 Staiger, [name] ([id number]) -------------------------------------------------------------------------------- Wound Assessment Details Patient Name: [name]. Date of Service: 03/31/2019 12:30 PM Medical Record Number: [id number] Patient Account Number: [id number] Date of Birth/Sex: 05/31/1930 (86 y.o. M) Treating RN: Montey Hora Primary Care Evert Wenrich: Caryl Pina Other Clinician: Referring Mayvis Agudelo: Caryl Pina Treating Janele Lague/Extender: STONE III, [name]: 8 Wound Status Wound Number: 10 Primary Arterial Insufficiency Ulcer Etiology: Wound  Location: Right Foot - Dorsal Wound Open Wounding Event: Gradually Appeared Status: Date Acquired: 02/08/2019 Comorbid Cataracts, Hypertension, Myocardial Infarction, Weeks Of Treatment: 2 History: Type II Diabetes, Osteoarthritis, Neuropathy Clustered Wound: No Photos Photo Uploaded By: Montey Hora on 03/31/2019 13:06:44 Wound Measurements Length: (cm) 6 Width: (cm) 0.9 Depth: (cm) 0.1 Area: (cm) 4.241 Volume: (cm) 0.424 % Reduction in Area: -184.2% % Reduction in Volume: -184.6% Epithelialization: None Tunneling: No Undermining: No Wound Description Classification: Unclassifiable Exudate Amount: None Present Foul Odor After Cleansing: No Slough/Fibrino No Wound Bed Granulation Amount: None Present (0%) Exposed Structure Necrotic Amount: Large (67-100%) Fascia Exposed: No Necrotic Quality: Eschar Fat Layer (Subcutaneous Tissue) Exposed: No Tendon Exposed: No Muscle Exposed: No Joint Exposed: No Bone Exposed: No Limited to Skin Breakdown Treatment Notes Wound #10 (Right, Dorsal Foot) Rettig, Alvie [name]. ([id number]) Notes betadine, gauze, conform Electronic  Signature(s) Signed: 03/31/2019 4:59:12 PM By: Montey Hora Entered By: Montey Hora on 03/31/2019 12:55:56 Henney, [name] ([id number]) -------------------------------------------------------------------------------- Wound Assessment Details Patient Name: [name]. Date of Service: 03/31/2019 12:30 PM Medical Record Number: [id number] Patient Account Number: [id number] Date of Birth/Sex: 16-Feb-1931 (86 y.o. M) Treating RN: Montey Hora Primary Care Farheen Pfahler: Caryl Pina Other Clinician: Referring Aaryana Betke: Caryl Pina Treating Ilya Neely/Extender: Melburn Hake, [name]: 8 Wound Status Wound Number: 11 Primary Arterial Insufficiency Ulcer Etiology: Wound Location: Left Calcaneus Wound Open Wounding Event: Gradually Appeared Status: Date Acquired: 02/08/2019 Comorbid Cataracts, Hypertension, Myocardial Infarction, Weeks Of Treatment: 2 History: Type II Diabetes, Osteoarthritis, Neuropathy Clustered Wound: No Photos Photo Uploaded By: Montey Hora on 03/31/2019 13:07:08 Wound Measurements Length: (cm) 2.01 Width: (cm) 3 Depth: (cm) 0.1 Area: (cm) 4.736 Volume: (cm) 0.474 % Reduction in Area: -0.5% % Reduction in Volume: -0.6% Epithelialization: None Tunneling: No Undermining: No Wound Description Classification: Unclassifiable Foul O Exudate Amount: None Present Slough dor After Cleansing: No /Fibrino No Wound Bed Granulation Amount: None Present (0%) Exposed Structure Necrotic Amount: Large (67-100%) Fascia Exposed: No Necrotic Quality: Eschar Fat Layer (Subcutaneous Tissue) Exposed: No Tendon Exposed: No Muscle Exposed: No Joint Exposed: No Bone Exposed: No Limited to Skin Breakdown Treatment Notes Wound #11 (Left Calcaneus) Reveles, Kanan [name]. ([id number]) Notes betadine, gauze, conform Electronic Signature(s) Signed: 03/31/2019 4:59:12 PM By: Montey Hora Entered By: Montey Hora on 03/31/2019 12:56:26 Senger, [name] ([id number]) -------------------------------------------------------------------------------- Wound Assessment Details Patient Name: [name]. Date of Service: 03/31/2019 12:30 PM Medical Record Number: [id number] Patient Account Number: [id number] Date of Birth/Sex: 20-Mar-1930 (86 y.o. M) Treating RN: Montey Hora Primary Care Naevia Unterreiner: Caryl Pina Other Clinician: Referring April Colter: Caryl Pina Treating Yoshiye Kraft/Extender: Melburn Hake, [name]: 8 Wound Status Wound Number: 12 Primary Arterial Insufficiency Ulcer Etiology: Wound Location: Left Metatarsal head fifth Wound Open Wounding Event: Gradually Appeared Status: Date Acquired: 02/08/2019 Comorbid Cataracts, Hypertension, Myocardial Infarction, Weeks Of Treatment: 2 History: Type II Diabetes, Osteoarthritis, Neuropathy Clustered Wound: No Photos Photo Uploaded By: Montey Hora on 03/31/2019 13:07:09 Wound Measurements Length: (cm) 1.7 Width: (cm) 0.8 Depth: (cm) 0.1 Area: (cm) 1.068 Volume: (cm) 0.107 % Reduction in Area: -126.8% % Reduction in Volume: -127.7% Epithelialization: None Tunneling: No Undermining: No Wound Description Classification: Unclassifiable Exudate Amount: None Present Foul Odor After Cleansing: No Slough/Fibrino No Wound Bed Granulation Amount: None Present (0%) Exposed Structure Necrotic Amount: Large (67-100%) Fascia Exposed: No Necrotic Quality: Eschar Fat Layer (Subcutaneous Tissue) Exposed: No Tendon Exposed: No Muscle Exposed: No Joint Exposed: No Bone Exposed: No Limited to Skin Breakdown Treatment Notes Wound #12 (Left Metatarsal  head fifth) JAESHAUN, RIVA ([id number]) Notes betadine, gauze, conform Electronic Signature(s) Signed: 03/31/2019 4:59:12 PM By: Montey Hora Entered By: Montey Hora on 03/31/2019 12:56:49 Auxier, [name]  ([id number]) -------------------------------------------------------------------------------- Wound Assessment Details Patient Name: YVES, FODOR. Date of Service: 03/31/2019 12:30 PM Medical Record Number: [id number] Patient Account Number: [id number] Date of Birth/Sex: 30-May-1930 (86 y.o. M) Treating RN: Montey Hora Primary Care Mena Simonis: Caryl Pina Other Clinician: Referring Rob Mciver: Caryl Pina Treating Eliane Hammersmith/Extender: Melburn Hake, [name]: 8 Wound Status Wound Number: 15 Primary Arterial Insufficiency Ulcer Etiology: Wound Location: Right Foot - Plantar Wound Open Wounding Event: Gradually Appeared Status: Date Acquired: 03/21/2019 Comorbid Cataracts, Hypertension, Myocardial Infarction, Weeks Of Treatment: 1 History: Type II Diabetes, Osteoarthritis, Neuropathy Clustered Wound: No Photos Photo Uploaded By: Montey Hora on 03/31/2019 13:07:33 Wound Measurements Length: (cm) 2.2 Width: (cm) 2.9 Depth: (cm) 0.1 Area: (cm) 5.011 Volume: (cm) 0.501 % Reduction in Area: 8.9% % Reduction in Volume: 8.9% Epithelialization: None Tunneling: No Undermining: No Wound Description Classification: Unclassifiable Exudate Amount: None Present Foul Odor After Cleansing: No Slough/Fibrino No Wound Bed Granulation Amount: None Present (0%) Exposed Structure Necrotic Amount: Large (67-100%) Fascia Exposed: No Necrotic Quality: Eschar Fat Layer (Subcutaneous Tissue) Exposed: No Tendon Exposed: No Muscle Exposed: No Joint Exposed: No Bone Exposed: No Limited to Skin Breakdown Treatment Notes Wound #13 (Right, Plantar Foot) Harcum, Leonell [name]. ([id number]) Notes betadine, gauze, conform Electronic Signature(s) Signed: 03/31/2019 4:59:12 PM By: Montey Hora Entered By: Montey Hora on 03/31/2019 12:57:11 Withem, [name] ([id number]) -------------------------------------------------------------------------------- Wound Assessment  Details Patient Name: [name]. Date of Service: 03/31/2019 12:30 PM Medical Record Number: [id number] Patient Account Number: [id number] Date of Birth/Sex: 10/11/1930 (86 y.o. M) Treating RN: Montey Hora Primary Care Mahmoud Blazejewski: Caryl Pina Other Clinician: Referring [place] Inthavong: Caryl Pina Treating Chenell Lozon/Extender: Melburn Hake, [name]: 8 Wound Status Wound Number: 14 Primary Arterial Insufficiency Ulcer Etiology: Wound Location: Right Malleolus - Lateral Wound Open Wounding Event: Gradually Appeared Status: Date Acquired: 03/21/2019 Comorbid Cataracts, Hypertension, Myocardial Infarction, Weeks Of Treatment: 1 History: Type II Diabetes, Osteoarthritis, Neuropathy Clustered Wound: No Photos Photo Uploaded By: Montey Hora on 03/31/2019 13:07:34 Wound Measurements Length: (cm) 0.8 Width: (cm) 1.4 Depth: (cm) 0.1 Area: (cm) 0.88 Volume: (cm) 0.088 % Reduction in Area: -12.1% % Reduction in Volume: -11.4% Epithelialization: None Tunneling: No Undermining: No Wound Description Classification: Unclassifiable Foul O Wound Margin: Indistinct, nonvisible Slough Exudate Amount: None Present dor After Cleansing: No /Fibrino No Wound Bed Granulation Amount: None Present (0%) Exposed Structure Necrotic Amount: Large (67-100%) Fascia Exposed: No Necrotic Quality: Eschar Fat Layer (Subcutaneous Tissue) Exposed: No Tendon Exposed: No Muscle Exposed: No Joint Exposed: No Bone Exposed: No Limited to Skin Breakdown Treatment Notes MELQUISEDEC, JOURNEY ([id number]) Wound #14 (Right, Lateral Malleolus) Notes betadine, gauze, conform Electronic Signature(s) Signed: 03/31/2019 4:59:12 PM By: Montey Hora Entered By: Montey Hora on 03/31/2019 12:57:31 Bekele, [name] ([id number]) -------------------------------------------------------------------------------- Wound Assessment Details Patient Name: [name]. Date of Service: 03/31/2019  12:30 PM Medical Record Number: [id number] Patient Account Number: [id number] Date of Birth/Sex: 12/28/1930 (86 y.o. M) Treating RN: Montey Hora Primary Care Shacola Schussler: Caryl Pina Other Clinician: Referring Madilyn Cephas: Caryl Pina Treating Zeynep Fantroy/Extender: STONE III, [name]: 8 Wound Status Wound Number: 2 Primary Pressure Ulcer Etiology: Wound Location: Right Toe Fourth Wound Open Wounding Event: Gradually Appeared Status: Date Acquired: 09/05/2018 Comorbid Cataracts, Hypertension, Myocardial Infarction, Weeks Of Treatment: 8 History: Type II Diabetes, Osteoarthritis, Neuropathy Clustered Wound: No Photos Photo Uploaded  By: Montey Hora on 03/31/2019 13:07:58 Wound Measurements Length: (cm) 0.1 Width: (cm) 0.2 Depth: (cm) 0.1 Area: (cm) 0.016 Volume: (cm) 0.002 % Reduction in Area: 83% % Reduction in Volume: 77.8% Epithelialization: None Tunneling: No Undermining: No Wound Description Classification: Unstageable/Unclassified Exudate Amount: None Present Foul Odor After Cleansing: No Slough/Fibrino No Wound Bed Granulation Amount: None Present (0%) Exposed Structure Necrotic Amount: Large (67-100%) Fascia Exposed: No Necrotic Quality: Eschar Fat Layer (Subcutaneous Tissue) Exposed: No Tendon Exposed: No Muscle Exposed: No Joint Exposed: No Bone Exposed: No Limited to Skin Breakdown Treatment Notes Wound #2 (Right Toe Fourth) EZRAEL, SAM ([id number]) Notes betadine, gauze, conform Electronic Signature(s) Signed: 03/31/2019 4:59:12 PM By: Montey Hora Entered By: Montey Hora on 03/31/2019 12:59:33 Younge, [name] ([id number]) -------------------------------------------------------------------------------- Wound Assessment Details Patient Name: [name]. Date of Service: 03/31/2019 12:30 PM Medical Record Number: [id number] Patient Account Number: [id number] Date of Birth/Sex: 1931-01-18 (86 y.o. M) Treating RN:  Montey Hora Primary Care Jahvier Aldea: Caryl Pina Other Clinician: Referring Ronalda Walpole: Caryl Pina Treating Colden Samaras/Extender: Melburn Hake, [name]: 8 Wound Status Wound Number: 3 Primary Pressure Ulcer Etiology: Wound Location: Right Toe Fifth Wound Open Wounding Event: Pressure Injury Status: Date Acquired: 09/05/2018 Comorbid Cataracts, Hypertension, Myocardial Infarction, Weeks Of Treatment: 8 History: Type II Diabetes, Osteoarthritis, Neuropathy Clustered Wound: No Photos Photo Uploaded By: Montey Hora on 03/31/2019 13:07:58 Wound Measurements Length: (cm) 0.7 Width: (cm) 0.8 Depth: (cm) 0.1 Area: (cm) 0.44 Volume: (cm) 0.044 % Reduction in Area: -40.1% % Reduction in Volume: -41.9% Epithelialization: None Tunneling: No Undermining: No Wound Description Classification: Unstageable/Unclassified Exudate Amount: None Present Foul Odor After Cleansing: No Slough/Fibrino No Wound Bed Granulation Amount: None Present (0%) Exposed Structure Necrotic Amount: Large (67-100%) Fascia Exposed: No Necrotic Quality: Eschar Fat Layer (Subcutaneous Tissue) Exposed: No Tendon Exposed: No Muscle Exposed: No Joint Exposed: No Bone Exposed: No Limited to Skin Breakdown Treatment Notes Wound #3 (Right Toe Fifth) JACARIE, PATE ([id number]) Notes betadine, gauze, conform Electronic Signature(s) Signed: 03/31/2019 4:59:12 PM By: Montey Hora Entered By: Montey Hora on 03/31/2019 12:59:56 Rohman, [name] ([id number]) -------------------------------------------------------------------------------- Wound Assessment Details Patient Name: [name]. Date of Service: 03/31/2019 12:30 PM Medical Record Number: [id number] Patient Account Number: [id number] Date of Birth/Sex: [birth year] (86 y.o. M) Treating RN: Montey Hora Primary Care Willmer Fellers: Caryl Pina Other Clinician: Referring Aarika Moon: Caryl Pina Treating  Sameera Betton/Extender: STONE III, [name]: 8 Wound Status Wound Number: 4 Primary Diabetic Wound/Ulcer of the Lower Extremity Etiology: Wound Location: Left Toe Third - Lateral Wound Open Wounding Event: Gradually Appeared Status: Date Acquired: 03/03/2019 Comorbid Cataracts, Hypertension, Myocardial Infarction, Weeks Of Treatment: 4 History: Type II Diabetes, Osteoarthritis, Neuropathy Clustered Wound: No Photos Photo Uploaded By: Montey Hora on 03/31/2019 13:08:27 Wound Measurements Length: (cm) 1.5 Width: (cm) 0.9 Depth: (cm) 0.1 Area: (cm) 1.06 Volume: (cm) 0.106 % Reduction in Area: -1027.7% % Reduction in Volume: -1077.8% Epithelialization: None Tunneling: No Undermining: No Wound Description Classification: Grade 1 Exudate Amount: None Present Foul Odor After Cleansing: No Slough/Fibrino No Wound Bed Granulation Amount: None Present (0%) Exposed Structure Necrotic Amount: Large (67-100%) Fascia Exposed: No Necrotic Quality: Eschar Fat Layer (Subcutaneous Tissue) Exposed: No Tendon Exposed: No Muscle Exposed: No Joint Exposed: No Bone Exposed: No Limited to Skin Breakdown Treatment Notes Wound #4 (Left, Lateral Toe Third) KAZ, AULD ([id number]) Notes silver ag, Conform Electronic Signature(s) Signed: 03/31/2019 4:59:12 PM By: Montey Hora Entered By: Montey Hora on 03/31/2019 13:00:23 Paster, [name] ([id number]) -------------------------------------------------------------------------------- Wound  Assessment Details Patient Name: DANISH, RUFFINS. Date of Service: 03/31/2019 12:30 PM Medical Record Number: [id number] Patient Account Number: [id number] Date of Birth/Sex: October 27, 1930 (86 y.o. M) Treating RN: Montey Hora Primary Care Deah Ottaway: Caryl Pina Other Clinician: Referring Jinna Weinman: Caryl Pina Treating Cola Highfill/Extender: Melburn Hake, [name]: 8 Wound Status Wound Number: 5 Primary Diabetic  Wound/Ulcer of the Lower Extremity Etiology: Wound Location: Left, Medial Toe Fourth Wound Open Wounding Event: Gradually Appeared Status: Date Acquired: 03/03/2019 Comorbid Cataracts, Hypertension, Myocardial Infarction, Weeks Of Treatment: 4 History: Type II Diabetes, Osteoarthritis, Neuropathy Clustered Wound: No Photos Photo Uploaded By: Montey Hora on 03/31/2019 13:08:28 Wound Measurements Length: (cm) 0.4 Width: (cm) 0.3 Depth: (cm) 0.1 Area: (cm) 0.094 Volume: (cm) 0.009 % Reduction in Area: 43% % Reduction in Volume: 43.8% Epithelialization: None Tunneling: No Undermining: No Wound Description Classification: Grade 1 Wound Margin: Flat and Intact Exudate Amount: Medium Exudate Type: Serous Exudate Color: amber Foul Odor After Cleansing: No Slough/Fibrino Yes Wound Bed Granulation Amount: Medium (34-66%) Exposed Structure Granulation Quality: Pink Fascia Exposed: No Necrotic Amount: Medium (34-66%) Fat Layer (Subcutaneous Tissue) Exposed: Yes Necrotic Quality: Adherent Slough Tendon Exposed: No Muscle Exposed: No Joint Exposed: No Bone Exposed: No Treatment Notes BERTRAN, ZEIMET ([id number]) Wound #5 (Left, Medial Toe Fourth) Notes silver ag, Conform Electronic Signature(s) Signed: 03/31/2019 4:59:12 PM By: Montey Hora Entered By: Montey Hora on 03/31/2019 13:01:06 Bradstreet, [name] ([id number]) -------------------------------------------------------------------------------- Wound Assessment Details Patient Name: [name]. Date of Service: 03/31/2019 12:30 PM Medical Record Number: [id number] Patient Account Number: [id number] Date of Birth/Sex: 1931/02/17 (86 y.o. M) Treating RN: Montey Hora Primary Care Nehan Flaum: Caryl Pina Other Clinician: Referring Jarelyn Bambach: Caryl Pina Treating Tonie Vizcarrondo/Extender: Melburn Hake, [name]: 8 Wound Status Wound Number: 6 Primary Diabetic Wound/Ulcer of the Lower  Extremity Etiology: Wound Location: Left Toe Fourth - Lateral Wound Open Wounding Event: Gradually Appeared Status: Date Acquired: 03/03/2019 Comorbid Cataracts, Hypertension, Myocardial Infarction, Weeks Of Treatment: 4 History: Type II Diabetes, Osteoarthritis, Neuropathy Clustered Wound: No Photos Photo Uploaded By: Montey Hora on 03/31/2019 13:09:00 Wound Measurements Length: (cm) 1.2 Width: (cm) 1 Depth: (cm) 0.3 Area: (cm) 0.942 Volume: (cm) 0.283 % Reduction in Area: 23.1% % Reduction in Volume: -15.5% Epithelialization: None Tunneling: No Undermining: No Wound Description Classification: Grade 1 Wound Margin: Flat and Intact Exudate Amount: Medium Exudate Type: Serous Exudate Color: amber Foul Odor After Cleansing: No Slough/Fibrino Yes Wound Bed Granulation Amount: None Present (0%) Exposed Structure Necrotic Amount: Large (67-100%) Fascia Exposed: No Necrotic Quality: Eschar, Adherent Slough Fat Layer (Subcutaneous Tissue) Exposed: Yes Tendon Exposed: No Muscle Exposed: No Joint Exposed: No Bone Exposed: No Treatment Notes DAE, HIGHLEY ([id number]) Wound #6 (Left, Lateral Toe Fourth) Notes silver ag, Conform Electronic Signature(s) Signed: 03/31/2019 4:59:12 PM By: Montey Hora Entered By: Montey Hora on 03/31/2019 13:01:52 Elsen, [name] ([id number]) -------------------------------------------------------------------------------- Wound Assessment Details Patient Name: [name]. Date of Service: 03/31/2019 12:30 PM Medical Record Number: [id number] Patient Account Number: [id number] Date of Birth/Sex: 07-08-1930 (86 y.o. M) Treating RN: Montey Hora Primary Care Roddy Bellamy: Caryl Pina Other Clinician: Referring Sherrise Liberto: Caryl Pina Treating Kathyann Spaugh/Extender: STONE III, [name]: 8 Wound Status Wound Number: 7 Primary Diabetic Wound/Ulcer of the Lower Extremity Etiology: Wound Location: Left Toe  Fifth - Medial Wound Open Wounding Event: Gradually Appeared Status: Date Acquired: 03/03/2019 Comorbid Cataracts, Hypertension, Myocardial Infarction, Weeks Of Treatment: 4 History: Type II Diabetes, Osteoarthritis, Neuropathy Clustered Wound: No Photos Photo Uploaded By: Montey Hora on  03/31/2019 13:09:01 Wound Measurements Length: (cm) 1.7 Width: (cm) 0.8 Depth: (cm) 0.1 Area: (cm) 1.068 Volume: (cm) 0.107 % Reduction in Area: -3% % Reduction in Volume: -2.9% Epithelialization: None Tunneling: No Undermining: No Wound Description Classification: Grade 1 Foul O Wound Margin: Flat and Intact Slough Exudate Amount: None Present dor After Cleansing: No /Fibrino No Wound Bed Granulation Amount: None Present (0%) Exposed Structure Necrotic Amount: Large (67-100%) Fascia Exposed: No Necrotic Quality: Eschar Fat Layer (Subcutaneous Tissue) Exposed: No Tendon Exposed: No Muscle Exposed: No Joint Exposed: No Bone Exposed: No Limited to Skin Breakdown Treatment Notes SAMULE, LIFE ([id number]) Wound #7 (Left, Medial Toe Fifth) Notes silver ag, Conform Electronic Signature(s) Signed: 03/31/2019 4:59:12 PM By: Montey Hora Entered By: Montey Hora on 03/31/2019 13:02:52 Kaspar, [name] ([id number]) -------------------------------------------------------------------------------- Wound Assessment Details Patient Name: [name]. Date of Service: 03/31/2019 12:30 PM Medical Record Number: [id number] Patient Account Number: [id number] Date of Birth/Sex: 14-Sep-1930 (86 y.o. M) Treating RN: Montey Hora Primary Care Aja Bolander: Caryl Pina Other Clinician: Referring Izabellah Dadisman: Caryl Pina Treating Allison Silva/Extender: Melburn Hake, [name]: 8 Wound Status Wound Number: 8 Primary Arterial Insufficiency Ulcer Etiology: Wound Location: Right Achilles Wound Open Wounding Event: Gradually Appeared Status: Date Acquired:  02/08/2019 Comorbid Cataracts, Hypertension, Myocardial Infarction, Weeks Of Treatment: 2 History: Type II Diabetes, Osteoarthritis, Neuropathy Clustered Wound: No Photos Photo Uploaded By: Montey Hora on 03/31/2019 13:09:29 Wound Measurements Length: (cm) 7.1 Width: (cm) 4.1 Depth: (cm) 0.7 Area: (cm) 22.863 Volume: (cm) 16.004 % Reduction in Area: 35.3% % Reduction in Volume: 35.3% Epithelialization: None Tunneling: No Undermining: No Wound Description Full Thickness Without Exposed Support Classification: Structures Wound Margin: Flat and Intact Exudate Medium Amount: Exudate Type: Purulent Exudate Color: yellow, brown, green Foul Odor After Cleansing: No Slough/Fibrino Yes Wound Bed Granulation Amount: None Present (0%) Exposed Structure Necrotic Amount: Large (67-100%) Fascia Exposed: No Necrotic Quality: Eschar, Adherent Slough Fat Layer (Subcutaneous Tissue) Exposed: Yes Tendon Exposed: No Muscle Exposed: No Joint Exposed: No Bone Exposed: No Stencel, Woodruff [name]. ([id number]) Treatment Notes Wound #8 (Right Achilles) Notes iodoflex, ABD, conform heel cup bilateral Electronic Signature(s) Signed: 03/31/2019 4:59:12 PM By: Montey Hora Entered By: Montey Hora on 03/31/2019 13:03:24 Quiett, [name] ([id number]) -------------------------------------------------------------------------------- Wound Assessment Details Patient Name: [name]. Date of Service: 03/31/2019 12:30 PM Medical Record Number: [id number] Patient Account Number: [id number] Date of Birth/Sex: [birth year] (86 y.o. M) Treating RN: Montey Hora Primary Care Giannah Zavadil: Caryl Pina Other Clinician: Referring Laetitia [name]: Caryl Pina Treating Shondell Fabel/Extender: Melburn Hake, [name]: 8 Wound Status Wound Number: 9 Primary Arterial Insufficiency Ulcer Etiology: Wound Location: Right Foot - Lateral Wound Open Wounding Event: Gradually  Appeared Status: Date Acquired: 02/08/2019 Comorbid Cataracts, Hypertension, Myocardial Infarction, Weeks Of Treatment: 2 History: Type II Diabetes, Osteoarthritis, Neuropathy Clustered Wound: No Photos Photo Uploaded By: Montey Hora on 03/31/2019 13:09:30 Wound Measurements Length: (cm) 1.6 Width: (cm) 1.4 Depth: (cm) 0.1 Area: (cm) 1.759 Volume: (cm) 0.176 % Reduction in Area: 12.2% % Reduction in Volume: 12% Epithelialization: None Tunneling: No Undermining: No Wound Description Classification: Unclassifiable Foul O Exudate Amount: None Present Slough dor After Cleansing: No /Fibrino No Wound Bed Granulation Amount: None Present (0%) Exposed Structure Necrotic Amount: Large (67-100%) Fascia Exposed: No Necrotic Quality: Eschar Fat Layer (Subcutaneous Tissue) Exposed: No Tendon Exposed: No Muscle Exposed: No Joint Exposed: No Bone Exposed: No Limited to Skin Breakdown Treatment Notes Wound #9 (Right, Lateral Foot) Claus, Traycen [name]. ([id number]) Notes betadine, gauze, conform Electronic Signature(s) Signed: 03/31/2019  4:59:12 PM By: Montey Hora Entered By: Montey Hora on 03/31/2019 13:04:01 Berch, [name] ([id number]) -------------------------------------------------------------------------------- Vitals Details Patient Name: [name]. Date of Service: 03/31/2019 12:30 PM Medical Record Number: [id number] Patient Account Number: [id number] Date of Birth/Sex: 1930/12/07 (86 y.o. M) Treating RN: Army Melia Primary Care River Mckercher: Caryl Pina Other Clinician: Referring Perrion Diesel: Caryl Pina Treating Aliahna Statzer/Extender: Melburn Hake, [name]: 8 Vital Signs Time Taken: 12:40 Temperature (F): 97.7 Height (in): 68 Pulse (bpm): 70 Weight (lbs): 235 Respiratory Rate (breaths/min): 16 Body Mass Index (BMI): 35.7 Blood Pressure (mmHg): 114/60 Reference Range: 80 - 120 mg / dl Electronic Signature(s) Signed: 03/31/2019  4:28:06 PM By: Lorine Bears RCP, RRT, CHT Entered By: Lorine Bears on 03/31/2019 12:44:01

## 2019-03-31 NOTE — Telephone Encounter (Signed)
Contacted [facility] to clarify previous orders. Patient had bone biopsy completed 2/3; awaiting results before ordering antibiotics. Spoke with Sharyn Lull to confirm they received documentation and to determine whether or not maintenance care could be provided until antibiotic orders are placed. Sharyn Lull stated they would begin the process in order to provide those services. Central line to be placed 2/5 @ 10:30am.  Carlean Purl, RN

## 2019-04-01 ENCOUNTER — Ambulatory Visit (HOSPITAL_COMMUNITY)
Admission: RE | Admit: 2019-04-01 | Discharge: 2019-04-01 | Disposition: A | Discharge status: Home / Self Care | Payer: Medicare Other | Source: Ambulatory Visit | Attending: Infectious Diseases | Admitting: Infectious Diseases

## 2019-04-01 ENCOUNTER — Other Ambulatory Visit: Payer: Medicare Other

## 2019-04-01 ENCOUNTER — Other Ambulatory Visit: Payer: Self-pay | Admitting: Infectious Diseases

## 2019-04-01 DIAGNOSIS — I739 Peripheral vascular disease, unspecified: Secondary | ICD-10-CM | POA: Diagnosis not present

## 2019-04-01 DIAGNOSIS — I129 Hypertensive chronic kidney disease with stage 1 through stage 4 chronic kidney disease, or unspecified chronic kidney disease: Secondary | ICD-10-CM | POA: Insufficient documentation

## 2019-04-01 DIAGNOSIS — E1121 Type 2 diabetes mellitus with diabetic nephropathy: Secondary | ICD-10-CM | POA: Insufficient documentation

## 2019-04-01 DIAGNOSIS — E1122 Type 2 diabetes mellitus with diabetic chronic kidney disease: Secondary | ICD-10-CM

## 2019-04-01 DIAGNOSIS — I2510 Atherosclerotic heart disease of native coronary artery without angina pectoris: Secondary | ICD-10-CM | POA: Diagnosis not present

## 2019-04-01 DIAGNOSIS — M86371 Chronic multifocal osteomyelitis, right ankle and foot: Secondary | ICD-10-CM

## 2019-04-01 DIAGNOSIS — N1832 Chronic kidney disease, stage 3b: Secondary | ICD-10-CM | POA: Insufficient documentation

## 2019-04-01 HISTORY — PX: IR US GUIDE VASC ACCESS RIGHT: IMG2390

## 2019-04-01 HISTORY — PX: IR FLUORO GUIDE CV LINE RIGHT: IMG2283

## 2019-04-01 MED ORDER — HEPARIN SOD (PORK) LOCK FLUSH 100 UNIT/ML IV SOLN
INTRAVENOUS | Status: AC
  Filled 2019-04-01: qty 5

## 2019-04-01 MED ORDER — LIDOCAINE HCL 1 % IJ SOLN
INTRAMUSCULAR | Status: AC
  Filled 2019-04-01: qty 20

## 2019-04-01 MED ORDER — LIDOCAINE HCL (PF) 1 % IJ SOLN
INTRAMUSCULAR | Status: AC | PRN
  Administered 2019-04-01: 10 mL

## 2019-04-01 NOTE — Progress Notes (Signed)
   Patient Status: [facility] - Out-pt  Assessment and Plan: Patient in need of venous access.  Patient with non-healing wounds in need of IV antibiotics.  Referred to IR for central venous access.  He does have chronic kidney disease stage III.  Plan for tunneled central venous catheter placement.   ______________________________________________________________________   History of Present Illness: [name]. is a 86 y.o. male with history of CAD, chronic renal insufficiency, HTN, PTSD, right foot ulcers with poor healing who is in need of venous access for long-term antibiotics.   Allergies and medications reviewed.   Review of Systems: A 12 point ROS discussed and pertinent positives are indicated in the HPI above.  All other systems are negative.  Review of Systems  Constitutional: Negative for fatigue and fever.  Respiratory: Negative for cough and shortness of breath.   Cardiovascular: Negative for chest pain.  Gastrointestinal: Negative for abdominal pain.  Genitourinary: Negative for dysuria.  Musculoskeletal: Negative for back pain.  Neurological: Negative for weakness.  Psychiatric/Behavioral: Negative for behavioral problems and confusion.    Vital Signs: There were no vitals taken for this visit.  Physical Exam Vitals and nursing note reviewed.  Constitutional:      General: He is not in acute distress.    Appearance: Normal appearance. He is not ill-appearing.  HENT:     Mouth/Throat:     Mouth: Mucous membranes are moist.     Pharynx: Oropharynx is clear.  Musculoskeletal:        General: No swelling. Normal range of motion.     Cervical back: Normal range of motion and neck supple.  Skin:    General: Skin is warm and dry.  Neurological:     General: No focal deficit present.     Mental Status: He is alert and oriented to person, place, and time. Mental status is at baseline.  Psychiatric:        Mood and Affect: Mood normal.        Behavior:  Behavior normal.        Thought Content: Thought content normal.        Judgment: Judgment normal.      Imaging reviewed.   Labs:  COAGS: No results for input(s): INR, APTT in the last 8760 hours.  BMP: Recent Labs    03/10/19 1459 03/22/19 1138 03/29/19 1005  BUN  --  39* 36*  CREATININE 1.70* 1.79* 1.49*  GFRNONAA  --  34* 42*  GFRAA  --  39* 49*       Electronically Signed: Docia Barrier, PA 04/01/2019, 1:01 PM   I spent a total of 15 minutes in face to face in clinical consultation, greater than 50% of which was counseling/coordinating care for venous access.

## 2019-04-01 NOTE — Procedures (Signed)
Interventional Radiology Procedure Note  Procedure: Tunneled CVC  Complications: None  Estimated Blood Loss: < 10 mL  Findings: Right IJ SL tunneled Power Line placed. Cut to 20 cm. Tip at SVC/RA junction.  Venetia Night. Kathlene Cote, M.D Pager:  [phone]

## 2019-04-01 NOTE — Telephone Encounter (Signed)
Faxed updated orders to [facility]; home health RN + PICC care orders included. [facility] confirmed that home health will be visiting patient's home tomorrow 2/6.   Clarke Amburn Lorita Officer, RN

## 2019-04-02 LAB — URINALYSIS, ROUTINE W REFLEX MICROSCOPIC
Bacteria, UA: NONE SEEN /HPF
Bilirubin Urine: NEGATIVE
Glucose, UA: NEGATIVE
Hyaline Cast: NONE SEEN /LPF
Ketones, ur: NEGATIVE
Nitrite: NEGATIVE
RBC / HPF: NONE SEEN /HPF (ref 0–2)
Specific Gravity, Urine: 1.017 (ref 1.001–1.03)
Squamous Epithelial / HPF: NONE SEEN /HPF (ref <=5)
WBC, UA: >=60 /HPF — AB (ref 0–5)
pH: 5.5 (ref 5.0–8.0)

## 2019-04-02 LAB — URINE CULTURE
MICRO NUMBER:: 10122252
SPECIMEN QUALITY:: ADEQUATE

## 2019-04-04 ENCOUNTER — Other Ambulatory Visit: Payer: Self-pay

## 2019-04-04 ENCOUNTER — Telehealth: Payer: Self-pay

## 2019-04-04 ENCOUNTER — Ambulatory Visit (INDEPENDENT_AMBULATORY_CARE_PROVIDER_SITE_OTHER): Payer: Medicare Other

## 2019-04-04 DIAGNOSIS — I5032 Chronic diastolic (congestive) heart failure: Secondary | ICD-10-CM

## 2019-04-04 DIAGNOSIS — Z951 Presence of aortocoronary bypass graft: Secondary | ICD-10-CM

## 2019-04-04 DIAGNOSIS — K5900 Constipation, unspecified: Secondary | ICD-10-CM

## 2019-04-04 DIAGNOSIS — L97419 Non-pressure chronic ulcer of right heel and midfoot with unspecified severity: Secondary | ICD-10-CM | POA: Diagnosis not present

## 2019-04-04 DIAGNOSIS — L97529 Non-pressure chronic ulcer of other part of left foot with unspecified severity: Secondary | ICD-10-CM

## 2019-04-04 DIAGNOSIS — Z7901 Long term (current) use of anticoagulants: Secondary | ICD-10-CM

## 2019-04-04 DIAGNOSIS — Z7984 Long term (current) use of oral hypoglycemic drugs: Secondary | ICD-10-CM

## 2019-04-04 DIAGNOSIS — E11621 Type 2 diabetes mellitus with foot ulcer: Secondary | ICD-10-CM

## 2019-04-04 DIAGNOSIS — Z87891 Personal history of nicotine dependence: Secondary | ICD-10-CM

## 2019-04-04 DIAGNOSIS — E1140 Type 2 diabetes mellitus with diabetic neuropathy, unspecified: Secondary | ICD-10-CM

## 2019-04-04 DIAGNOSIS — F4312 Post-traumatic stress disorder, chronic: Secondary | ICD-10-CM

## 2019-04-04 DIAGNOSIS — I2510 Atherosclerotic heart disease of native coronary artery without angina pectoris: Secondary | ICD-10-CM

## 2019-04-04 DIAGNOSIS — N182 Chronic kidney disease, stage 2 (mild): Secondary | ICD-10-CM

## 2019-04-04 DIAGNOSIS — E785 Hyperlipidemia, unspecified: Secondary | ICD-10-CM

## 2019-04-04 DIAGNOSIS — Z8619 Personal history of other infectious and parasitic diseases: Secondary | ICD-10-CM

## 2019-04-04 DIAGNOSIS — K219 Gastro-esophageal reflux disease without esophagitis: Secondary | ICD-10-CM

## 2019-04-04 DIAGNOSIS — H3530 Unspecified macular degeneration: Secondary | ICD-10-CM

## 2019-04-04 DIAGNOSIS — Q059 Spina bifida, unspecified: Secondary | ICD-10-CM

## 2019-04-04 DIAGNOSIS — Z9981 Dependence on supplemental oxygen: Secondary | ICD-10-CM

## 2019-04-04 DIAGNOSIS — F419 Anxiety disorder, unspecified: Secondary | ICD-10-CM

## 2019-04-04 DIAGNOSIS — L97519 Non-pressure chronic ulcer of other part of right foot with unspecified severity: Secondary | ICD-10-CM

## 2019-04-04 DIAGNOSIS — I130 Hypertensive heart and chronic kidney disease with heart failure and stage 1 through stage 4 chronic kidney disease, or unspecified chronic kidney disease: Secondary | ICD-10-CM

## 2019-04-04 DIAGNOSIS — H409 Unspecified glaucoma: Secondary | ICD-10-CM

## 2019-04-04 DIAGNOSIS — E1122 Type 2 diabetes mellitus with diabetic chronic kidney disease: Secondary | ICD-10-CM

## 2019-04-04 DIAGNOSIS — F329 Major depressive disorder, single episode, unspecified: Secondary | ICD-10-CM

## 2019-04-04 NOTE — Telephone Encounter (Signed)
Patient's son requested RN call back due to questions/concerns about central line maintenance. Son stated that after speaking to home health nurse he learned that they would not be coming daily to manage his dad's access. Wasn't aware that they do not come by the home daily and expressed concern about care being inconsistent. Spoke with [facility] who confirmed that a home health nurse will be provided weekly for labs and to change the dressing. The [facility] RN would be providing training to the family on how to manage the line daily to ensure its properly maintained until medication is started.   Called son back and reiterated the [facility] plan. Also asked if the patient has had his bone biopsy yet. Son is unsure about the biopsy. Antibiotic orders are pending biopsy results.   Avon Molock Lorita Officer, RN

## 2019-04-04 NOTE — Progress Notes (Signed)
HPI: 86 y.o. male presenting today as a new patient PMHx of DM, CAD, CKD presenting for evaluation of multiple wounds to the right lower extremity.  He is currently being managed by [facility] wound care center and has been evaluated by infectious disease, Dr. Bobby Rumpf, who is planning on PICC line placement for long-term IV antibiotics.  Patient presents today for discussion of surgical debridement with bone biopsy to help guide in antibiotic therapy.  Wounds have been present for several months now.  Past Medical History:  Diagnosis Date  . Agent orange exposure 1966 or 1971  . Anemia   . Anxiety   . Arthritis    "hands and back" (01/20/2013)  . CAD (coronary artery disease)    native vessel  . Carotid artery disease (HCC)    nonobstructive  . Cataract   . Cecal diverticulitis 2008   drained  . Cellulitis, gluteal    bilateral for the past 6 months/notes 01/20/2013  . Chronic lower back pain   . Chronic renal insufficiency   . Depression   . Diabetes mellitus type II   . Exertional shortness of breath   . HTN (hypertension)   . Hyperlipidemia   . Hypokalemia   . Malnutrition ([place])    protein-calorie  . Myocardial infarction [facility])    "silent; before OHS" (01/20/2013)  . PTSD (post-traumatic stress disorder)   . Spina bifida ([place])   . Urinary incontinence      Physical Exam: General: The patient is alert and oriented x3 in no acute distress.  Dermatology: Skin is warm, dry and supple bilateral lower extremities.   Wound #1 noted noted to the right plantar heel with an overlying eschar.  Minimal serous drainage noted.  Measures approximately 4.0x5.0cm (LxW).  Unstageable depth due to the overlying eschar.   Wound #2 noted to the Achilles tendon right lower extremity measuring approximately 6 x 3cm (LxW).  There is Achilles tendon exposed.  Again there is minimal serous drainage noted.  Overlying eschar.  No malodor noted.  Vascular: Palpable pedal pulses bilaterally.   Moderate edema noted to the right lower extremity.  This appears to have improved since being on antibiotics. Capillary refill within normal limits.  Neurological: Epicritic and protective threshold diminished bilaterally.   Musculoskeletal Exam: Range of motion within normal limits to all pedal and ankle joints bilateral. Muscle strength 5/5 in all groups bilateral.   Radiographic Exam:  Radiolucency noted to the posterior calcaneus that is concerning for osteomyelitis.  Vascular calcifications noted.  Modest degenerative changes noted consistent with the patient's age for generalized arthritic changes. No fracture/dislocation/boney destruction.  Soft tissue wounds noted consistent with clinical findings  Assessment: 1.  Ulcer right Achilles tendon 2.  Ulcer right plantar heel   Plan of Care:  1. Patient evaluated. .  2.  Today we are going to arrange surgical debridement of the ulcerations with possible wound VAC application.  Biopsy of the posterior aspect of the calcaneus will also be performed to help guide in long-term IV antibiotics 3.  All possible complications and details of the procedure were explained.  No guarantees were expressed or implied.  The patient and spouse today both agree and understand that surgical debridement needs to be performed in order to increase the patient's chance of limb salvage and wound healing. 4.  Authorization for surgery was initiated today.  Surgery will consist of excisional debridement of ulcers right heel and Achilles tendon.  Bone biopsy right posterior heel.  Possible  application of negative pressure wound VAC.  5.  Surgical be scheduled as soon as possible at [facility].  Return to clinic 1 week postop 6.  Continue management with infectious disease, Dr. Bobby Rumpf 7.  After surgical debridement and postoperative follow-up we may go ahead and refer the patient back to wound care for wound care management.   Edrick Kins,  DPM [facility]  Dr. Edrick Kins, DPM    [address], [place] [address]                Office [phone]  Fax [phone]

## 2019-04-05 ENCOUNTER — Other Ambulatory Visit: Payer: Self-pay | Admitting: Radiology

## 2019-04-05 ENCOUNTER — Telehealth: Payer: Self-pay

## 2019-04-05 ENCOUNTER — Other Ambulatory Visit: Payer: Self-pay | Admitting: *Deleted

## 2019-04-05 ENCOUNTER — Encounter (HOSPITAL_BASED_OUTPATIENT_CLINIC_OR_DEPARTMENT_OTHER): Payer: Self-pay | Admitting: Podiatry

## 2019-04-05 DIAGNOSIS — Z01818 Encounter for other preprocedural examination: Secondary | ICD-10-CM

## 2019-04-05 NOTE — Telephone Encounter (Signed)
CALLED [name] WITH [employer]. ALSO FAXED FORM AND CLINICALS TO HER FOR WOUND VAC.

## 2019-04-05 NOTE — Progress Notes (Signed)
Patient's chart including [facility] notes in Care everywhere from admission for covid 11-06-18 thru Oct 1-20 with Dr Lanetta Inch, [place] for [facility].

## 2019-04-06 ENCOUNTER — Ambulatory Visit (INDEPENDENT_AMBULATORY_CARE_PROVIDER_SITE_OTHER): Payer: Medicare Other | Admitting: Family Medicine

## 2019-04-06 ENCOUNTER — Other Ambulatory Visit: Payer: Self-pay

## 2019-04-06 ENCOUNTER — Encounter: Payer: Self-pay | Admitting: Family Medicine

## 2019-04-06 ENCOUNTER — Encounter (HOSPITAL_COMMUNITY)
Admission: RE | Admit: 2019-04-06 | Discharge: 2019-04-06 | Disposition: A | Discharge status: Home / Self Care | Payer: Medicare Other | Source: Ambulatory Visit | Attending: Podiatry | Admitting: Podiatry

## 2019-04-06 ENCOUNTER — Other Ambulatory Visit (HOSPITAL_COMMUNITY)
Admission: RE | Admit: 2019-04-06 | Discharge: 2019-04-06 | Disposition: A | Discharge status: Home / Self Care | Payer: Medicare Other | Source: Ambulatory Visit | Attending: Podiatry | Admitting: Podiatry

## 2019-04-06 VITALS — BP 114/70 | HR 85 | Temp 97.8°F | Ht 68.0 in | Wt 230.0 lb

## 2019-04-06 DIAGNOSIS — E1122 Type 2 diabetes mellitus with diabetic chronic kidney disease: Secondary | ICD-10-CM | POA: Diagnosis not present

## 2019-04-06 DIAGNOSIS — M86371 Chronic multifocal osteomyelitis, right ankle and foot: Secondary | ICD-10-CM

## 2019-04-06 DIAGNOSIS — Z20822 Contact with and (suspected) exposure to covid-19: Secondary | ICD-10-CM | POA: Diagnosis not present

## 2019-04-06 DIAGNOSIS — Z01818 Encounter for other preprocedural examination: Secondary | ICD-10-CM | POA: Diagnosis not present

## 2019-04-06 DIAGNOSIS — N183 Chronic kidney disease, stage 3 unspecified: Secondary | ICD-10-CM

## 2019-04-06 DIAGNOSIS — I2510 Atherosclerotic heart disease of native coronary artery without angina pectoris: Secondary | ICD-10-CM

## 2019-04-06 DIAGNOSIS — I10 Essential (primary) hypertension: Secondary | ICD-10-CM

## 2019-04-06 DIAGNOSIS — I7025 Atherosclerosis of native arteries of other extremities with ulceration: Secondary | ICD-10-CM | POA: Diagnosis not present

## 2019-04-06 DIAGNOSIS — Q056 Thoracic spina bifida without hydrocephalus: Secondary | ICD-10-CM

## 2019-04-06 LAB — BASIC METABOLIC PANEL
Anion gap: 9 (ref 5–15)
BUN: 44 mg/dL — ABNORMAL HIGH (ref 8–23)
CO2: 23 mmol/L (ref 22–32)
Calcium: 9.2 mg/dL (ref 8.9–10.3)
Chloride: 106 mmol/L (ref 98–111)
Creatinine, Ser: 1.63 mg/dL — ABNORMAL HIGH (ref 0.61–1.24)
GFR calc Af Amer: 44 mL/min — ABNORMAL LOW (ref >60)
GFR calc non Af Amer: 38 mL/min — ABNORMAL LOW (ref >60)
Glucose, Bld: 162 mg/dL — ABNORMAL HIGH (ref 70–99)
Potassium: 4.2 mmol/L (ref 3.5–5.1)
Sodium: 138 mmol/L (ref 135–145)

## 2019-04-06 LAB — SARS CORONAVIRUS 2 (TAT 6-24 HRS): SARS Coronavirus 2: NEGATIVE

## 2019-04-06 LAB — BAYER DCA HB A1C WAIVED: HB A1C (BAYER DCA - WAIVED): 5.7 % (ref <7.0)

## 2019-04-06 NOTE — Progress Notes (Signed)
Subjective:  Patient ID: Bobby Lamas., male    DOB: 10-05-1930  Age: 86 y.o. MRN: [id number]  CC: Surgical Clearance   HPI Bobby Gatling. presents for surgical clearance for expiration of the right Achilles area which is suspected of having osteomyelitis.  The patient is wheelchair-bound due to infantile spina bifida.  He lived with a quite well most of his adult life but became weaker and weaker starting about 15 years ago.  About 5 years ago he ended up having to spend most of his time in the wheelchair.  He has been followed for the heel lesion by orthopedics.  He is in today for surgical clearance requested by them.  He is a known diabetic.  He is not checking his sugars regularly.  He takes Plavix due to history of cardiovascular disease.  Additionally he takes 81 mg aspirin daily.  Other medications are reviewed and listed below.  Depression screen [facility] 2/9 04/06/2019 03/25/2019 07/08/2018  Decreased Interest 0 0 0  Down, Depressed, Hopeless 0 0 0  PHQ - 2 Score 0 0 0  Some recent data might be hidden    History [name] has a past medical history of Agent orange exposure (1966 or 1971), Anemia, Anxiety, Arthritis, CAD (coronary artery disease), Carotid artery disease ([place]), Cataract, Cecal diverticulitis (2008), Cellulitis, gluteal, Chronic lower back pain, Chronic renal insufficiency, Depression, Diabetes mellitus type II, Exertional shortness of breath, HTN (hypertension), Hyperlipidemia, Hypokalemia, Malnutrition ([place]), Myocardial infarction ([place]), PTSD (post-traumatic stress disorder), Spina bifida ([place]), and Urinary incontinence.   He has a past surgical history that includes Spine surgery (11/24/1930); Knee surgery (Left, 1964); Coronary artery bypass graft (1998); Hemorrhoid banding (~ 10/2012); Carpal tunnel release (Right, 1980's); Cardiac catheterization (1998); Cystectomy (2000's); Cataract extraction w/ intraocular lens  implant, bilateral (Bilateral, ~ 2010); Eye surgery;  Prostate surgery; Strabismus surgery (Bilateral, 03/29/2015); Lower Extremity Angiography (Right, 03/22/2019); Lower Extremity Angiography (Left, 03/29/2019); IR Fluoro Guide CV Line Right (04/01/2019); and IR US Guide Vasc Access Right (04/01/2019).   His family history includes Cancer in his sister and sister; Heart attack in his father and mother; Heart disease in his father and mother.He reports that he quit smoking about 38 years ago. His smoking use included cigarettes. He started smoking about 70 years ago. He has a 40.00 pack-year smoking history. He has quit using smokeless tobacco.  His smokeless tobacco use included chew. He reports that he does not drink alcohol or use drugs.    ROS Review of Systems  Constitutional: Negative.   HENT: Negative.   Eyes: Negative for visual disturbance.  Respiratory: Negative for cough and shortness of breath.   Cardiovascular: Negative for chest pain and leg swelling.  Gastrointestinal: Negative for abdominal pain, diarrhea, nausea and vomiting.  Genitourinary: Negative for difficulty urinating.  Musculoskeletal: Positive for arthralgias and myalgias.  Skin: Negative for rash.  Neurological: Negative for headaches.  Psychiatric/Behavioral: Negative for sleep disturbance.    Objective:  BP 114/70   Pulse 85   Temp 97.8 F (36.6 C) (Temporal)   Ht 5\' 8"  (1.727 m)   Wt 230 lb (104.3 kg)   BMI 34.97 kg/m   BP Readings from Last 3 Encounters:  04/06/19 114/70  03/29/19 116/63  03/25/19 (!) 142/81    Wt Readings from Last 3 Encounters:  04/06/19 230 lb (104.3 kg)  03/29/19 230 lb (104.3 kg)  03/31/18 238 lb (108 kg)     Physical Exam Vitals reviewed.  Constitutional:  Appearance: He is well-developed.  HENT:     Head: Normocephalic and atraumatic.     Right Ear: External ear normal.     Left Ear: External ear normal.     Mouth/Throat:     Pharynx: No oropharyngeal exudate or posterior oropharyngeal erythema.  Eyes:     Pupils:  Pupils are equal, round, and reactive to light.  Cardiovascular:     Rate and Rhythm: Normal rate and regular rhythm.     Heart sounds: No murmur.  Pulmonary:     Effort: No respiratory distress.     Breath sounds: No wheezing.  Musculoskeletal:        General: Deformity present.     Cervical back: Normal range of motion and neck supple.  Neurological:     Mental Status: He is alert and oriented to person, place, and time.       Assessment & Plan:   [name] was seen today for surgical clearance.  Diagnoses and all orders for this visit:  Type 2 diabetes mellitus with stage 3 chronic kidney disease, without long-term current use of insulin, unspecified whether stage 3a or 3b CKD (HCC) -     Bayer DCA Hb A1c Waived  Pre-op exam -     Bayer DCA Hb A1c Waived  Chronic multifocal osteomyelitis of right foot ([place])  Spina bifida of thoracolumbar region, unspecified hydrocephalus presence ([place])  Atherosclerosis of native coronary artery of native heart without angina pectoris  Essential hypertension   Patient seen today for clearance for a surgical procedure.  This is a debridement of the right heel and Achilles region by orthopedics.  He should do well with this procedure.  However, anesthesia will have to assess risk of spinal or epidural anesthesia if they choose that as an option for him due to his history of spina bifida.  His coronary artery disease seems to be very stable.  He should continue taking his metoprolol.  However he will need to stop his aspirin and Plavix immediately.  Ideally that should be stopped for 5 days before the procedure.  I will leave it up to the orthopedist to determine what level of bleeding they could and anticipate during this procedure.  If it tends to be one that has a significant amount of bleeding then they may want to delay it until he can be off of this medicine for 5 days.  His EKG is pending.  That was to be done at [facility] after  this visit.  He underwent an A1c today in my office it was 6.6 which means that he is well controlled for his diabetes and that should not present an obstacle to this procedure or to his recovery.    I am having [name]. [name]. maintain his aspirin, Co Q-10, NON FORMULARY, Multiple Vitamins-Minerals (PRESERVISION AREDS 2 PO), nitroGLYCERIN, polyethylene glycol powder, Janumet, ezetimibe-simvastatin, buPROPion, guaiFENesin, sodium chloride, metoprolol tartrate, glipiZIDE, nystatin cream, Iodoflex, and clopidogrel.  Allergies as of 04/06/2019      Reactions   Sulfonamide Derivatives    REACTION: pruitis patient cant remember its been so long    Aspirin Rash   In high doses   Nitrofurantoin Rash   Penicillins Rash   Did it involve swelling of the face/tongue/throat, SOB, or low BP? No Did it involve sudden or severe rash/hives, skin peeling, or any reaction on the inside of your mouth or nose? No Did you need to seek medical attention at a hospital or doctor's  office? No When did it last happen?Teenager If all above answers are "NO", may proceed with cephalosporin use.   Sulfa Antibiotics Rash      Medication List       Accurate as of April 06, 2019  6:15 PM. If you have any questions, ask your nurse or doctor.        aspirin 81 MG tablet Take 81 mg by mouth daily.   buPROPion 300 MG 24 hr tablet Commonly known as: WELLBUTRIN XL Take 1 tablet (300 mg total) by mouth daily.   clopidogrel 75 MG tablet Commonly known as: Plavix Take 1 tablet (75 mg total) by mouth daily.   Co Q-10 100 MG Caps Take 1 capsule by mouth 2 (two) times daily.   ezetimibe-simvastatin 10-20 MG tablet Commonly known as: VYTORIN Take 1 tablet by mouth daily.   glipiZIDE 10 MG tablet Commonly known as: GLUCOTROL Take 10 mg by mouth daily.   guaiFENesin 600 MG 12 hr tablet Commonly known as: Mucinex Take 1 tablet (600 mg total) by mouth 2 (two) times daily.   Iodoflex 0.9 %  Pads Generic drug: Cadexomer Iodine Apply 1 application topically See admin instructions. Twice a week heel   Janumet 50-500 MG tablet Generic drug: sitaGLIPtin-metformin Take 1 tablet by mouth daily. with food   metoprolol tartrate 25 MG tablet Commonly known as: LOPRESSOR Take 0.5 tablets (12.5 mg total) by mouth 2 (two) times daily.   nitroGLYCERIN 0.4 MG SL tablet Commonly known as: NITROSTAT Place 1 tablet (0.4 mg total) under the tongue every 5 (five) minutes as needed.   NON FORMULARY Take 1 tablet by mouth daily as needed (Pain). cran-actin 100mg  1 tablet daily   nystatin cream Commonly known as: MYCOSTATIN Apply 1 application topically daily as needed (foot sore).   polyethylene glycol powder 17 GM/SCOOP powder Commonly known as: GLYCOLAX/MIRALAX Take 17 g by mouth 2 (two) times daily as needed.   PRESERVISION AREDS 2 PO Take 1 capsule by mouth 2 (two) times daily.   sodium chloride 0.65 % Soln nasal spray Commonly known as: OCEAN Place 1 spray into both nostrils as needed for congestion.        Follow-up: No follow-ups on file.  Claretta Fraise, M.D.

## 2019-04-07 ENCOUNTER — Ambulatory Visit: Payer: Medicare Other | Admitting: Infectious Diseases

## 2019-04-07 ENCOUNTER — Ambulatory Visit (INDEPENDENT_AMBULATORY_CARE_PROVIDER_SITE_OTHER): Payer: Medicare Other | Admitting: Infectious Diseases

## 2019-04-07 ENCOUNTER — Ambulatory Visit: Payer: Medicare Other | Admitting: Physician Assistant

## 2019-04-07 ENCOUNTER — Encounter: Payer: Self-pay | Admitting: Infectious Diseases

## 2019-04-07 DIAGNOSIS — M86371 Chronic multifocal osteomyelitis, right ankle and foot: Secondary | ICD-10-CM | POA: Diagnosis not present

## 2019-04-07 DIAGNOSIS — E1121 Type 2 diabetes mellitus with diabetic nephropathy: Secondary | ICD-10-CM

## 2019-04-07 DIAGNOSIS — E1122 Type 2 diabetes mellitus with diabetic chronic kidney disease: Secondary | ICD-10-CM

## 2019-04-07 DIAGNOSIS — N1832 Chronic kidney disease, stage 3b: Secondary | ICD-10-CM

## 2019-04-07 DIAGNOSIS — I7025 Atherosclerosis of native arteries of other extremities with ulceration: Secondary | ICD-10-CM | POA: Diagnosis not present

## 2019-04-07 NOTE — Assessment & Plan Note (Addendum)
He will have bone bx tomorrow.  Start anbx at home 2-15 or 2-16.  Will follow up after Cx of bone.  With his multiple eschars, will be difficulty to keep his extremity.  Has WOC f/u.

## 2019-04-07 NOTE — Progress Notes (Signed)
Clearance and chart reviewed with Dr. Sabra Heck. Ok to proceed with surgery as scheduled. H&P in office visit from Dr. Livia Snellen. Left message for Delydia at Dr. Amalia Hailey office to make sure he has seen Dr. Livia Snellen' recommendations in regards to anticoagulants that the patient just stopped yesterday.

## 2019-04-07 NOTE — Assessment & Plan Note (Addendum)
States his fsg have been erratic- Up to 180, low 41 (this AM).  Son working on getting diabetic nurse.

## 2019-04-07 NOTE — Progress Notes (Signed)
   Subjective:    Patient ID: [name]., male    DOB: 1930-08-09, 86 y.o.   MRN: [id number]  HPI 86 yo M with hx of scratch on his R heel. This was roughly 4-6 weeks ago. The area has become progressively larger. He has been seen at wound center in [place]. It developed drainage from the wound. No f/c. No erythema or streaking.  On 1-26 he underwent angioplasty and stent in his R leg and is planned for L leg eval next week. He was started on cefdinir at the wound care center (? Date).    He has a hx of DM2 x 41 years (got from "agent orange"). 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   Also hx of CABG. Had COVID in [place].    He had plain films done on 12-24 that showed a questionable lucency. This was followed by MRI (03-10-19) that showed:  Findings consistent with osteomyelitis in the calcaneus which is worst in the posterior body at the Achilles tendon insertion. *Severe Achilles tendinopathy with a partial tear of the tendon. *There is a mild enhancement the distal fibers of the tendon worrisome for infection. *Complex fluid in the retrocalcaneal bursa worrisome for septic bursitis.Negative for septic joint. *Patchy, mild marrow edema in talus, navicular and bones of the midfoot is likely due to stress reactive change.  He will have bone bx with Dr Amalia Hailey at [facility].   He thinks that maybe his foot has been healing better per one of his home nurses.   Review of Systems     Objective:   Physical Exam Vitals reviewed.  Musculoskeletal:       Feet:           Assessment & Plan:

## 2019-04-08 ENCOUNTER — Encounter (HOSPITAL_BASED_OUTPATIENT_CLINIC_OR_DEPARTMENT_OTHER): Payer: Self-pay | Admitting: Podiatry

## 2019-04-08 ENCOUNTER — Ambulatory Visit (HOSPITAL_BASED_OUTPATIENT_CLINIC_OR_DEPARTMENT_OTHER): Payer: Medicare Other | Admitting: Anesthesiology

## 2019-04-08 ENCOUNTER — Ambulatory Visit (HOSPITAL_BASED_OUTPATIENT_CLINIC_OR_DEPARTMENT_OTHER)
Admission: RE | Admit: 2019-04-08 | Discharge: 2019-04-08 | Disposition: A | Discharge status: Home / Self Care | Payer: Medicare Other | Attending: Podiatry | Admitting: Podiatry

## 2019-04-08 ENCOUNTER — Other Ambulatory Visit: Payer: Self-pay

## 2019-04-08 ENCOUNTER — Encounter (HOSPITAL_BASED_OUTPATIENT_CLINIC_OR_DEPARTMENT_OTHER)
Admission: RE | Disposition: A | Discharge status: Home / Self Care | Payer: Self-pay | Source: Home / Self Care | Attending: Podiatry

## 2019-04-08 DIAGNOSIS — Z7984 Long term (current) use of oral hypoglycemic drugs: Secondary | ICD-10-CM | POA: Insufficient documentation

## 2019-04-08 DIAGNOSIS — I129 Hypertensive chronic kidney disease with stage 1 through stage 4 chronic kidney disease, or unspecified chronic kidney disease: Secondary | ICD-10-CM | POA: Diagnosis not present

## 2019-04-08 DIAGNOSIS — Z951 Presence of aortocoronary bypass graft: Secondary | ICD-10-CM | POA: Insufficient documentation

## 2019-04-08 DIAGNOSIS — E1169 Type 2 diabetes mellitus with other specified complication: Secondary | ICD-10-CM | POA: Insufficient documentation

## 2019-04-08 DIAGNOSIS — E1122 Type 2 diabetes mellitus with diabetic chronic kidney disease: Secondary | ICD-10-CM | POA: Insufficient documentation

## 2019-04-08 DIAGNOSIS — Z87891 Personal history of nicotine dependence: Secondary | ICD-10-CM | POA: Diagnosis not present

## 2019-04-08 DIAGNOSIS — L97318 Non-pressure chronic ulcer of right ankle with other specified severity: Secondary | ICD-10-CM | POA: Diagnosis not present

## 2019-04-08 DIAGNOSIS — I2510 Atherosclerotic heart disease of native coronary artery without angina pectoris: Secondary | ICD-10-CM | POA: Insufficient documentation

## 2019-04-08 DIAGNOSIS — F419 Anxiety disorder, unspecified: Secondary | ICD-10-CM | POA: Insufficient documentation

## 2019-04-08 DIAGNOSIS — Z77098 Contact with and (suspected) exposure to other hazardous, chiefly nonmedicinal, chemicals: Secondary | ICD-10-CM | POA: Insufficient documentation

## 2019-04-08 DIAGNOSIS — I739 Peripheral vascular disease, unspecified: Secondary | ICD-10-CM | POA: Diagnosis not present

## 2019-04-08 DIAGNOSIS — Z7902 Long term (current) use of antithrombotics/antiplatelets: Secondary | ICD-10-CM | POA: Diagnosis not present

## 2019-04-08 DIAGNOSIS — Z7982 Long term (current) use of aspirin: Secondary | ICD-10-CM | POA: Insufficient documentation

## 2019-04-08 DIAGNOSIS — E1151 Type 2 diabetes mellitus with diabetic peripheral angiopathy without gangrene: Secondary | ICD-10-CM | POA: Diagnosis not present

## 2019-04-08 DIAGNOSIS — E11622 Type 2 diabetes mellitus with other skin ulcer: Secondary | ICD-10-CM | POA: Insufficient documentation

## 2019-04-08 DIAGNOSIS — N183 Chronic kidney disease, stage 3 unspecified: Secondary | ICD-10-CM | POA: Insufficient documentation

## 2019-04-08 DIAGNOSIS — E11621 Type 2 diabetes mellitus with foot ulcer: Secondary | ICD-10-CM | POA: Diagnosis not present

## 2019-04-08 DIAGNOSIS — L97519 Non-pressure chronic ulcer of other part of right foot with unspecified severity: Secondary | ICD-10-CM | POA: Diagnosis not present

## 2019-04-08 DIAGNOSIS — I252 Old myocardial infarction: Secondary | ICD-10-CM | POA: Diagnosis not present

## 2019-04-08 DIAGNOSIS — F329 Major depressive disorder, single episode, unspecified: Secondary | ICD-10-CM | POA: Diagnosis not present

## 2019-04-08 DIAGNOSIS — Q051 Thoracic spina bifida with hydrocephalus: Secondary | ICD-10-CM | POA: Insufficient documentation

## 2019-04-08 DIAGNOSIS — Z993 Dependence on wheelchair: Secondary | ICD-10-CM | POA: Diagnosis not present

## 2019-04-08 DIAGNOSIS — M86171 Other acute osteomyelitis, right ankle and foot: Secondary | ICD-10-CM | POA: Diagnosis not present

## 2019-04-08 DIAGNOSIS — M86371 Chronic multifocal osteomyelitis, right ankle and foot: Secondary | ICD-10-CM | POA: Insufficient documentation

## 2019-04-08 DIAGNOSIS — E785 Hyperlipidemia, unspecified: Secondary | ICD-10-CM | POA: Diagnosis not present

## 2019-04-08 DIAGNOSIS — L97512 Non-pressure chronic ulcer of other part of right foot with fat layer exposed: Secondary | ICD-10-CM | POA: Diagnosis not present

## 2019-04-08 HISTORY — PX: WOUND DEBRIDEMENT: SHX247

## 2019-04-08 HISTORY — PX: BONE BIOPSY: SHX375

## 2019-04-08 HISTORY — PX: APPLICATION OF WOUND VAC: SHX5189

## 2019-04-08 LAB — GLUCOSE, CAPILLARY
Glucose-Capillary: 109 mg/dL — ABNORMAL HIGH (ref 70–99)
Glucose-Capillary: 117 mg/dL — ABNORMAL HIGH (ref 70–99)

## 2019-04-08 SURGERY — DEBRIDEMENT, WOUND
Anesthesia: General | Site: Foot | Laterality: Right

## 2019-04-08 MED ORDER — ONDANSETRON HCL 4 MG/2ML IJ SOLN
INTRAMUSCULAR | Status: DC | PRN
  Administered 2019-04-08: 4 mg via INTRAVENOUS

## 2019-04-08 MED ORDER — MICROFIBRILLAR COLL HEMOSTAT EX PADS
MEDICATED_PAD | CUTANEOUS | Status: DC | PRN
  Administered 2019-04-08: 2 via TOPICAL

## 2019-04-08 MED ORDER — SODIUM CHLORIDE 0.9 % IV SOLN: INTRAVENOUS | Status: DC | PRN

## 2019-04-08 MED ORDER — PHENYLEPHRINE HCL (PRESSORS) 10 MG/ML IV SOLN
INTRAVENOUS | Status: DC | PRN
  Administered 2019-04-08: 80 ug via INTRAVENOUS

## 2019-04-08 MED ORDER — LIDOCAINE HCL 2 % IJ SOLN
INTRAMUSCULAR | Status: AC
  Filled 2019-04-08: qty 20

## 2019-04-08 MED ORDER — MIDAZOLAM HCL 2 MG/2ML IJ SOLN: 1.0000 mg | INTRAMUSCULAR | Status: DC | PRN

## 2019-04-08 MED ORDER — FENTANYL CITRATE (PF) 100 MCG/2ML IJ SOLN
INTRAMUSCULAR | Status: AC
  Filled 2019-04-08: qty 2

## 2019-04-08 MED ORDER — OXYCODONE-ACETAMINOPHEN 5-325 MG PO TABS: 1.0000 | ORAL_TABLET | Freq: Four times a day (QID) | ORAL | 0 refills | Status: DC | PRN

## 2019-04-08 MED ORDER — FENTANYL CITRATE (PF) 100 MCG/2ML IJ SOLN
50.0000 ug | INTRAMUSCULAR | Status: DC | PRN
  Administered 2019-04-08: 25 ug via INTRAVENOUS

## 2019-04-08 MED ORDER — POVIDONE-IODINE 7.5 % EX SOLN: Freq: Once | CUTANEOUS | Status: AC

## 2019-04-08 MED ORDER — ROCURONIUM BROMIDE 100 MG/10ML IV SOLN
INTRAVENOUS | Status: DC | PRN
  Administered 2019-04-08: 50 mg via INTRAVENOUS

## 2019-04-08 MED ORDER — LIDOCAINE 2% (20 MG/ML) 5 ML SYRINGE
INTRAMUSCULAR | Status: AC
  Filled 2019-04-08: qty 5

## 2019-04-08 MED ORDER — SUGAMMADEX SODIUM 200 MG/2ML IV SOLN
INTRAVENOUS | Status: DC | PRN
  Administered 2019-04-08: 200 mg via INTRAVENOUS

## 2019-04-08 MED ORDER — LACTATED RINGERS IV SOLN: INTRAVENOUS | Status: DC

## 2019-04-08 MED ORDER — LIDOCAINE HCL (CARDIAC) PF 100 MG/5ML IV SOSY
PREFILLED_SYRINGE | INTRAVENOUS | Status: DC | PRN
  Administered 2019-04-08: 80 mg via INTRAVENOUS

## 2019-04-08 MED ORDER — PROPOFOL 10 MG/ML IV BOLUS
INTRAVENOUS | Status: DC | PRN
  Administered 2019-04-08: 70 mg via INTRAVENOUS

## 2019-04-08 MED ORDER — BUPIVACAINE HCL (PF) 0.5 % IJ SOLN
INTRAMUSCULAR | Status: DC | PRN
  Administered 2019-04-08: 20 mL

## 2019-04-08 MED ORDER — PHENYLEPHRINE HCL-NACL 10-0.9 MG/250ML-% IV SOLN
INTRAVENOUS | Status: DC | PRN
  Administered 2019-04-08: 40 ug/min via INTRAVENOUS

## 2019-04-08 SURGICAL SUPPLY — 60 items
APL PRP STRL LF DISP 70% ISPRP (MISCELLANEOUS) ×1
BLADE SURG 15 STRL LF DISP TIS (BLADE) ×2 IMPLANT
BLADE SURG 15 STRL SS (BLADE) ×6
BNDG CMPR 9X4 STRL LF SNTH (GAUZE/BANDAGES/DRESSINGS) ×1
BNDG ELASTIC 3X5.8 VLCR STR LF (GAUZE/BANDAGES/DRESSINGS) ×2 IMPLANT
BNDG ELASTIC 4X5.8 VLCR STR LF (GAUZE/BANDAGES/DRESSINGS) IMPLANT
BNDG ESMARK 4X9 LF (GAUZE/BANDAGES/DRESSINGS) ×3 IMPLANT
BNDG GAUZE ELAST 4 BULKY (GAUZE/BANDAGES/DRESSINGS) ×5 IMPLANT
CHLORAPREP W/TINT 26 (MISCELLANEOUS) ×3 IMPLANT
COVER BACK TABLE 60X90IN (DRAPES) ×3 IMPLANT
COVER WAND RF STERILE (DRAPES) IMPLANT
CUFF TOURN SGL QUICK 18X4 (TOURNIQUET CUFF) ×2 IMPLANT
CUFF TOURN SGL QUICK 24 (TOURNIQUET CUFF)
CUFF TRNQT CYL 24X4X16.5-23 (TOURNIQUET CUFF) IMPLANT
DRAPE EXTREMITY T 121X128X90 (DISPOSABLE) ×3 IMPLANT
DRAPE IMP U-DRAPE 54X76 (DRAPES) ×3 IMPLANT
DRAPE SURG 17X23 STRL (DRAPES) ×2 IMPLANT
DRAPE U-SHAPE 47X51 STRL (DRAPES) ×3 IMPLANT
ELECT REM PT RETURN 9FT ADLT (ELECTROSURGICAL) ×3
ELECTRODE REM PT RTRN 9FT ADLT (ELECTROSURGICAL) ×1 IMPLANT
EXT HOSE W/PLC CONNECTION (MISCELLANEOUS) ×3
EXTENSION HOSE W/PLC CONNECTON (MISCELLANEOUS) IMPLANT
GAUZE 4X4 16PLY RFD (DISPOSABLE) IMPLANT
GAUZE SPONGE 4X4 12PLY STRL (GAUZE/BANDAGES/DRESSINGS) ×3 IMPLANT
GAUZE SPONGE 4X4 12PLY STRL LF (GAUZE/BANDAGES/DRESSINGS) ×2 IMPLANT
GAUZE XEROFORM 1X8 LF (GAUZE/BANDAGES/DRESSINGS) ×3 IMPLANT
GAUZE XEROFORM 5X9 LF (GAUZE/BANDAGES/DRESSINGS) ×2 IMPLANT
GLOVE BIO SURGEON STRL SZ8 (GLOVE) ×2 IMPLANT
GLOVE BIOGEL PI IND STRL 8 (GLOVE) IMPLANT
GLOVE BIOGEL PI INDICATOR 8 (GLOVE) ×2
GOWN STRL REUS W/ TWL LRG LVL3 (GOWN DISPOSABLE) ×1 IMPLANT
GOWN STRL REUS W/ TWL XL LVL3 (GOWN DISPOSABLE) ×1 IMPLANT
GOWN STRL REUS W/TWL LRG LVL3 (GOWN DISPOSABLE) ×3
GOWN STRL REUS W/TWL XL LVL3 (GOWN DISPOSABLE) ×3
NDL BIOPSY JAMSHIDI 8X6 (NEEDLE) IMPLANT
NDL HYPO 25X1 1.5 SAFETY (NEEDLE) ×1 IMPLANT
NDL SAFETY ECLIPSE 18X1.5 (NEEDLE) IMPLANT
NEEDLE BIOPSY JAMSHIDI 8X6 (NEEDLE) ×3 IMPLANT
NEEDLE HYPO 18GX1.5 SHARP (NEEDLE) ×3
NEEDLE HYPO 25X1 1.5 SAFETY (NEEDLE) ×3 IMPLANT
NS IRRIG 1000ML POUR BTL (IV SOLUTION) ×2 IMPLANT
PACK BASIN DAY SURGERY FS (CUSTOM PROCEDURE TRAY) ×3 IMPLANT
PADDING CAST ABS 4INX4YD NS (CAST SUPPLIES) ×4
PADDING CAST ABS COTTON 4X4 ST (CAST SUPPLIES) ×2 IMPLANT
PENCIL SMOKE EVACUATOR (MISCELLANEOUS) ×3 IMPLANT
SHEET MEDIUM DRAPE 40X70 STRL (DRAPES) ×2 IMPLANT
SPLINT FIBERGLASS 4X30 (CAST SUPPLIES) ×3 IMPLANT
SPONGE LAP 18X18 RF (DISPOSABLE) ×6 IMPLANT
STAPLER VISISTAT 35W (STAPLE) IMPLANT
STOCKINETTE 6  STRL (DRAPES) ×2
STOCKINETTE 6 STRL (DRAPES) ×1 IMPLANT
SUT MNCRL AB 3-0 PS2 18 (SUTURE) IMPLANT
SUT MNCRL AB 4-0 PS2 18 (SUTURE) IMPLANT
SUT MON AB 5-0 PS2 18 (SUTURE) IMPLANT
SUT PROLENE 4 0 PS 2 18 (SUTURE) IMPLANT
SUT VIC AB 3-0 PS2 18 (SUTURE) IMPLANT
SUT VIC AB 4-0 P2 18 (SUTURE) IMPLANT
SYR BULB 3OZ (MISCELLANEOUS) ×3 IMPLANT
SYR CONTROL 10ML LL (SYRINGE) ×2 IMPLANT
UNDERPAD 30X36 HEAVY ABSORB (UNDERPADS AND DIAPERS) ×3 IMPLANT

## 2019-04-08 NOTE — Progress Notes (Signed)
MD notified of whether to continue to plavix and aspirin. MD called son and told him to resume tomorrow am. Vaughan Basta, Marchelle Folks

## 2019-04-08 NOTE — Anesthesia Procedure Notes (Signed)
Procedure Name: Intubation Date/Time: 04/08/2019 3:12 PM Performed by: Maryella Shivers, CRNA Pre-anesthesia Checklist: Patient identified, Emergency Drugs available, Suction available and Patient being monitored Patient Re-evaluated:Patient Re-evaluated prior to induction Oxygen Delivery Method: Circle system utilized Preoxygenation: Pre-oxygenation with 100% oxygen Induction Type: IV induction Ventilation: Mask ventilation without difficulty Laryngoscope Size: Mac and 3 Grade View: Grade I Tube type: Oral Tube size: 7.0 mm Number of attempts: 1 Airway Equipment and Method: Stylet and Oral airway Placement Confirmation: ETT inserted through vocal cords under direct vision,  positive ETCO2 and breath sounds checked- equal and bilateral Secured at: 21 cm Tube secured with: Tape Dental Injury: Teeth and Oropharynx as per pre-operative assessment

## 2019-04-08 NOTE — OR Nursing (Signed)
Wound to left forearm wrapped with ace bandage, kerlix, xeroform to wound.

## 2019-04-08 NOTE — OR Nursing (Signed)
Pt has an area on left forearm that the skin has broken. Dr. Amalia Hailey put a xeroform gauze to wound and wrapped with kerlix.

## 2019-04-08 NOTE — Discharge Instructions (Signed)
Discharge Instructions After Orthopedic Procedures:  *You may feel tired and weak following your procedure. It is recommended that you limit physical activity for the next 24 hours and rest at home for the remainder of today and tomorrow. *No strenuous activity should be started without your doctor's permission.  Elevate the extremity that you had surgery on to a level above your heart. This should continue for 48 hours or as instructed by your doctor.  If you had hand, arm or shoulder surgery you should move your fingers frequently unless otherwise instructed by your doctor.  If you had foot, knee or leg surgery you should wiggle your toes frequently unless otherwise instructed by your doctor.  Follow your doctor's exact instructions for activity at home. Use your home equipment as instructed. (Crutches, hard shoes, slings etc.)  Limit your activity as instructed by your doctor.  Report to your doctor should any of the following occur: 1. Extreme swelling of your fingers or toes. 2. Inability to wiggle your fingers or toes. 3. Coldness, pale or bluish color in your fingers or toes. 4. Loss of sensation, numbness or tingling of your fingers or toes. 5. Unusual smell or odor from under your dressing or cast. 6. Excessive bleeding or drainage from the surgical site. 7. Pain not relieved by medication your doctor has prescribed for you. 8. Cast or dressing too tight (do not get your dressing or cast wet or put anything under          your dressing or cast.)  *Do not change your dressing unless instructed by your doctor or discharge nurse. Then follow exact instructions.  *Follow labeled instructions for any medications that your doctor may have prescribed for you. *Should any questions or complications develop following your procedure, PLEASE CONTACT YOUR DOCTOR.      Post Anesthesia Home Care Instructions  Activity: Get plenty of rest for the remainder of the day. A responsible  individual must stay with you for 24 hours following the procedure.  For the next 24 hours, DO NOT: -Drive a car -Paediatric nurse -Drink alcoholic beverages -Take any medication unless instructed by your physician -Make any legal decisions or sign important papers.  Meals: Start with liquid foods such as gelatin or soup. Progress to regular foods as tolerated. Avoid greasy, spicy, heavy foods. If nausea and/or vomiting occur, drink only clear liquids until the nausea and/or vomiting subsides. Call your physician if vomiting continues.  Special Instructions/Symptoms: Your throat may feel dry or sore from the anesthesia or the breathing tube placed in your throat during surgery. If this causes discomfort, gargle with warm salt water. The discomfort should disappear within 24 hours.  If you had a scopolamine patch placed behind your ear for the management of post- operative nausea and/or vomiting:  1. The medication in the patch is effective for 72 hours, after which it should be removed.  Wrap patch in a tissue and discard in the trash. Wash hands thoroughly with soap and water. 2. You may remove the patch earlier than 72 hours if you experience unpleasant side effects which may include dry mouth, dizziness or visual disturbances. 3. Avoid touching the patch. Wash your hands with soap and water after contact with the patch.

## 2019-04-08 NOTE — Transfer of Care (Signed)
Immediate Anesthesia Transfer of Care Note  Patient: [name].  Procedure(s) Performed: DEBRIDEMENT WOUND (Right Foot) APPLICATION OF WOUND VAC (Right Foot) BONE BIOPSY (Right Foot)  Patient Location: PACU  Anesthesia Type:General  Level of Consciousness: awake, alert  and oriented  Airway & Oxygen Therapy: Patient Spontanous Breathing and Patient connected to face mask oxygen  Post-op Assessment: Report given to RN and Post -op Vital signs reviewed and stable  Post vital signs: Reviewed and stable  Last Vitals:  Vitals Value Taken Time  BP 142/75 04/08/19 1630  Temp    Pulse 82 04/08/19 1632  Resp 15 04/08/19 1632  SpO2 100 % 04/08/19 1632  Vitals shown include unvalidated device data.  Last Pain:  Vitals:   04/08/19 1150  TempSrc: Oral  PainSc: 3       Patients Stated Pain Goal: 3 (37/09/64 3838)  Complications: No apparent anesthesia complications

## 2019-04-08 NOTE — Brief Op Note (Signed)
04/08/2019  5:14 PM  PATIENT:  [name].  86 y.o. male  PRE-OPERATIVE DIAGNOSIS:  ACUTE OSTEOMYLITIS OF RIGHT FOOT  POST-OPERATIVE DIAGNOSIS:  ACUTE OSTEOMYLITIS OF RIGHT FOOT  PROCEDURE:  Procedure(s): DEBRIDEMENT WOUND (Right) APPLICATION OF WOUND VAC (Right) BONE BIOPSY (Right)  SURGEON:  Surgeon(s) and Role:    Edrick Kins, DPM - Primary  PHYSICIAN ASSISTANT:   ASSISTANTS: none   ANESTHESIA:   general  EBL:  250 mL   BLOOD ADMINISTERED:none  DRAINS: none   LOCAL MEDICATIONS USED:  MARCAINE     SPECIMEN:  Source of Specimen:  Bone Biopsy Calcaneus RT  DISPOSITION OF SPECIMEN:  PATHOLOGY  COUNTS:  YES  TOURNIQUET:  * Missing tourniquet times found for documented tourniquets in log: [id number] * Total Tourniquet Time Documented: Calf (Right) - 41 minutes Total: Calf (Right) - 41 minutes   DICTATION: .Viviann Spare Dictation  PLAN OF CARE: Discharge to home after PACU  PATIENT DISPOSITION:  PACU - hemodynamically stable.   Delay start of Pharmacological VTE agent (>24hrs) due to surgical blood loss or risk of bleeding: not applicable  Edrick Kins, DPM [facility]  Dr. Edrick Kins, DPM    [address], [place] [address]                Office [phone]  Fax [phone]

## 2019-04-08 NOTE — Anesthesia Preprocedure Evaluation (Signed)
Anesthesia Evaluation  Patient identified by MRN, date of birth, ID band Patient awake    Reviewed: Allergy & Precautions, NPO status , Patient's Chart, lab work & pertinent test results  Airway Mallampati: II  TM Distance: >3 FB Neck ROM: Full    Dental no notable dental hx.    Pulmonary neg pulmonary ROS, former smoker,    Pulmonary exam normal breath sounds clear to auscultation       Cardiovascular hypertension, + CAD, + Past MI and + Peripheral Vascular Disease  Normal cardiovascular exam Rhythm:Regular Rate:Normal     Neuro/Psych negative neurological ROS  negative psych ROS   GI/Hepatic negative GI ROS, Neg liver ROS,   Endo/Other  diabetes  Renal/GU Renal InsufficiencyRenal disease  negative genitourinary   Musculoskeletal negative musculoskeletal ROS (+)   Abdominal   Peds negative pediatric ROS (+)  Hematology negative hematology ROS (+)   Anesthesia Other Findings   Reproductive/Obstetrics negative OB ROS                             Anesthesia Physical Anesthesia Plan  ASA: III  Anesthesia Plan: General   Post-op Pain Management:    Induction: Intravenous  PONV Risk Score and Plan: 2 and Ondansetron, Dexamethasone and Treatment may vary due to age or medical condition  Airway Management Planned: LMA  Additional Equipment:   Intra-op Plan:   Post-operative Plan: Extubation in OR  Informed Consent: I have reviewed the patients History and Physical, chart, labs and discussed the procedure including the risks, benefits and alternatives for the proposed anesthesia with the patient or authorized representative who has indicated his/her understanding and acceptance.     Dental advisory given  Plan Discussed with: CRNA and Surgeon  Anesthesia Plan Comments:         Anesthesia Quick Evaluation

## 2019-04-11 ENCOUNTER — Ambulatory Visit (INDEPENDENT_AMBULATORY_CARE_PROVIDER_SITE_OTHER): Payer: Medicare Other

## 2019-04-11 ENCOUNTER — Encounter: Payer: Self-pay | Admitting: *Deleted

## 2019-04-11 ENCOUNTER — Ambulatory Visit (INDEPENDENT_AMBULATORY_CARE_PROVIDER_SITE_OTHER): Payer: Medicare Other | Admitting: Podiatry

## 2019-04-11 ENCOUNTER — Telehealth: Payer: Self-pay | Admitting: Podiatry

## 2019-04-11 ENCOUNTER — Other Ambulatory Visit: Payer: Self-pay

## 2019-04-11 DIAGNOSIS — M86171 Other acute osteomyelitis, right ankle and foot: Secondary | ICD-10-CM

## 2019-04-11 DIAGNOSIS — L97512 Non-pressure chronic ulcer of other part of right foot with fat layer exposed: Secondary | ICD-10-CM

## 2019-04-11 LAB — SURGICAL PATHOLOGY

## 2019-04-11 NOTE — Telephone Encounter (Signed)
[name] son called to the office at 6:52 PM on Monday evening.  He said that his father who is wearing a wound VAC hit it against the fridge and significant fluid/blood was evident in the bandage.  I checked with Dr. Amalia Hailey who said to rebandage this foot and expect the nurse to come out tomorrow to work on the wound VAC .  I contacted [name]  the son and told him to elevate the foot , rebandage the foot and wait for the nurse to come tomorrow.   Patient to call back if needed.   Gardiner Barefoot DPM.

## 2019-04-11 NOTE — Anesthesia Postprocedure Evaluation (Signed)
Anesthesia Post Note  Patient: [name].  Procedure(s) Performed: DEBRIDEMENT WOUND (Right Foot) APPLICATION OF WOUND VAC (Right Foot) BONE BIOPSY (Right Foot)     Patient location during evaluation: PACU Anesthesia Type: General Level of consciousness: awake and alert Pain management: pain level controlled Vital Signs Assessment: post-procedure vital signs reviewed and stable Respiratory status: spontaneous breathing, nonlabored ventilation, respiratory function stable and patient connected to nasal cannula oxygen Cardiovascular status: blood pressure returned to baseline and stable Postop Assessment: no apparent nausea or vomiting Anesthetic complications: no    Last Vitals:  Vitals:   04/08/19 1700 04/08/19 1730  BP: 116/66   Pulse: 66   Resp: (!) 25   Temp:    SpO2: 98% 100%    Last Pain:  Vitals:   04/08/19 1730  TempSrc:   PainSc: 0-No pain                 Yamira Papa S

## 2019-04-12 ENCOUNTER — Telehealth: Payer: Self-pay

## 2019-04-12 ENCOUNTER — Telehealth: Payer: Self-pay | Admitting: *Deleted

## 2019-04-12 NOTE — Telephone Encounter (Signed)
Called [facility] today and spoke with Yvette Rack RN, and I verbally gave the orders over the phone per Dr Lilyan Punt vac needs to be changed 2 x a week for 6 weeks and the patient has the wound vac and the supplies and stated to call the office if any concerns or questions. [name]

## 2019-04-12 NOTE — Telephone Encounter (Signed)
Cherly- from [facility] called stating they had received orders for PICC line to flush daily, but they need verbal orders to change the PICC line dressing WEEKLY. Please advice

## 2019-04-13 ENCOUNTER — Encounter: Payer: Medicare Other | Admitting: Podiatry

## 2019-04-13 ENCOUNTER — Telehealth: Payer: Self-pay

## 2019-04-13 NOTE — Telephone Encounter (Signed)
Received call today from [name] regarding updates on IV medication. States patient has had picc line in for a week now, but does not have medication. Patient is flushing picc.  Will forward message to MD to advise on IV medication orders. Last update; medication is pending on cultures. [name]

## 2019-04-14 ENCOUNTER — Ambulatory Visit (INDEPENDENT_AMBULATORY_CARE_PROVIDER_SITE_OTHER): Payer: Medicare Other | Admitting: Vascular Surgery

## 2019-04-14 ENCOUNTER — Encounter (INDEPENDENT_AMBULATORY_CARE_PROVIDER_SITE_OTHER): Payer: Medicare Other

## 2019-04-14 ENCOUNTER — Ambulatory Visit: Payer: Medicare Other | Admitting: Physician Assistant

## 2019-04-14 NOTE — Telephone Encounter (Signed)
He needs to be on dapto (pharmacy protocol) and cefepime 2g q12h IVPB.  Needs weekly CBC, CMP, CK His bone bx was not sent for Cx, was only sent for path.  This is the disconnect in his care.  Thanks

## 2019-04-15 ENCOUNTER — Encounter: Payer: Medicare Other | Admitting: Podiatry

## 2019-04-15 ENCOUNTER — Other Ambulatory Visit (INDEPENDENT_AMBULATORY_CARE_PROVIDER_SITE_OTHER): Payer: Self-pay | Admitting: Vascular Surgery

## 2019-04-15 ENCOUNTER — Telehealth: Payer: Self-pay | Admitting: Family Medicine

## 2019-04-15 DIAGNOSIS — I70239 Atherosclerosis of native arteries of right leg with ulceration of unspecified site: Secondary | ICD-10-CM

## 2019-04-15 DIAGNOSIS — I70249 Atherosclerosis of native arteries of left leg with ulceration of unspecified site: Secondary | ICD-10-CM

## 2019-04-15 DIAGNOSIS — Z95820 Peripheral vascular angioplasty status with implants and grafts: Secondary | ICD-10-CM

## 2019-04-15 NOTE — Telephone Encounter (Signed)
[facility] called to update that copay for Dapto is $257.77.  Will forward message to pharmacy and [place], [place]

## 2019-04-15 NOTE — Progress Notes (Signed)
LEROI, HAQUE ([id number]) Visit Report for 03/24/2019 Arrival Information Details Patient Name: [name], [name]. Date of Service: 03/24/2019 12:30 PM Medical Record Number: [id number] Patient Account Number: [id number] Date of Birth/Sex: 1930-06-07 (86 y.o. M) Treating RN: Cornell Barman Primary Care Ruairi Stutsman: Caryl Pina Other Clinician: Referring Rosella Crandell: Caryl Pina Treating Davyn Elsasser/Extender: Melburn Hake, [name]: 7 Visit Information History Since Last Visit Added or deleted any medications: Yes Patient Arrived: Wheel Chair Pain Present Now: No Arrival Time: 12:44 Accompanied By: Son Transfer Assistance: None Patient Identification Verified: Yes Secondary Verification Process Yes Completed: Patient Has Alerts: Yes Patient Alerts: Patient on Blood Thinner Notes Patient remains in chair Electronic Signature(s) Signed: 04/15/2019 11:50:22 AM By: Gretta Cool, BSN, RN, CWS, Kim RN, BSN Entered By: Gretta Cool, BSN, RN, CWS, Kim on 03/24/2019 12:48:57 Raj, [name] ([id number]) -------------------------------------------------------------------------------- Clinic Level of Care Assessment Details Patient Name: [name]. Date of Service: 03/24/2019 12:30 PM Medical Record Number: [id number] Patient Account Number: [id number] Date of Birth/Sex: [birth year] (86 y.o. M) Treating RN: Army Melia Primary Care Nakota Ackert: Caryl Pina Other Clinician: Referring Marijayne Rauth: Caryl Pina Treating Meldrick Buttery/Extender: Melburn Hake, [name]: 7 Clinic Level of Care Assessment Items TOOL 4 Quantity Score []  - Use when only an EandM is performed on FOLLOW-UP visit 0 ASSESSMENTS - Nursing Assessment / Reassessment X - Reassessment of Co-morbidities (includes updates in patient status) 1 10 X- 1 5 Reassessment of Adherence to Treatment Plan ASSESSMENTS - Wound and Skin Assessment / Reassessment []  - Simple Wound Assessment / Reassessment - one wound  0 X- 14 5 Complex Wound Assessment / Reassessment - multiple wounds []  - 0 Dermatologic / Skin Assessment (not related to wound area) ASSESSMENTS - Focused Assessment []  - Circumferential Edema Measurements - multi extremities 0 []  - 0 Nutritional Assessment / Counseling / Intervention []  - 0 Lower Extremity Assessment (monofilament, tuning fork, pulses) []  - 0 Peripheral Arterial Disease Assessment (using hand held doppler) ASSESSMENTS - Ostomy and/or Continence Assessment and Care []  - Incontinence Assessment and Management 0 []  - 0 Ostomy Care Assessment and Management (repouching, etc.) PROCESS - Coordination of Care X - Simple Patient / Family Education for ongoing care 1 15 []  - 0 Complex (extensive) Patient / Family Education for ongoing care []  - 0 Staff obtains Programmer, systems, Records, Test Results / Process Orders []  - 0 Staff telephones HHA, Nursing Homes / Clarify orders / etc []  - 0 Routine Transfer to another Facility (non-emergent condition) []  - 0 Routine Hospital Admission (non-emergent condition) []  - 0 New Admissions / Biomedical engineer / Ordering NPWT, Apligraf, etc. []  - 0 Emergency Hospital Admission (emergent condition) X- 1 10 Simple Discharge Coordination ZYRON, DEELEY ([id number]) []  - 0 Complex (extensive) Discharge Coordination PROCESS - Special Needs []  - Pediatric / Minor Patient Management 0 []  - 0 Isolation Patient Management []  - 0 Hearing / Language / Visual special needs []  - 0 Assessment of Community assistance (transportation, D/C planning, etc.) []  - 0 Additional assistance / Altered mentation []  - 0 Support Surface(s) Assessment (bed, cushion, seat, etc.) INTERVENTIONS - Wound Cleansing / Measurement []  - Simple Wound Cleansing - one wound 0 X- 14 5 Complex Wound Cleansing - multiple wounds X- 1 5 Wound Imaging (photographs - any number of wounds) []  - 0 Wound Tracing (instead of photographs) []  - 0 Simple Wound  Measurement - one wound X- 14 5 Complex Wound Measurement - multiple wounds INTERVENTIONS - Wound Dressings []  - Small Wound Dressing one or  multiple wounds 0 X- 14 15 Medium Wound Dressing one or multiple wounds []  - 0 Large Wound Dressing one or multiple wounds []  - 0 Application of Medications - topical []  - 0 Application of Medications - injection INTERVENTIONS - Miscellaneous []  - External ear exam 0 []  - 0 Specimen Collection (cultures, biopsies, blood, body fluids, etc.) []  - 0 Specimen(s) / Culture(s) sent or taken to Lab for analysis []  - 0 Patient Transfer (multiple staff / Civil Service fast streamer / Similar devices) []  - 0 Simple Staple / Suture removal (25 or less) []  - 0 Complex Staple / Suture removal (26 or more) []  - 0 Hypo / Hyperglycemic Management (close monitor of Blood Glucose) []  - 0 Ankle / Brachial Index (ABI) - do not check if billed separately X- 1 5 Vital Signs Neyhart, TAREZ BOWNS. ([id number]) Has the patient been seen at the hospital within the last three years: Yes Total Score: 470 Level Of Care: New/Established - Level 5 Electronic Signature(s) Signed: 03/24/2019 3:29:42 PM By: Army Melia Entered By: Army Melia on 03/24/2019 13:42:06 Chavero, [name] ([id number]) -------------------------------------------------------------------------------- Encounter Discharge Information Details Patient Name: [name]. Date of Service: 03/24/2019 12:30 PM Medical Record Number: [id number] Patient Account Number: [id number] Date of Birth/Sex: February 06, 1931 (86 y.o. M) Treating RN: Army Melia Primary Care Carlon Davidson: Caryl Pina Other Clinician: Referring Analisia Kingsford: Caryl Pina Treating Arianis Bowditch/Extender: Melburn Hake, [name]: 7 Encounter Discharge Information Items Discharge Condition: Stable Ambulatory Status: Wheelchair Discharge Destination: Home Transportation: Private Auto Accompanied By: family Schedule Follow-up Appointment:  Yes Clinical Summary of Care: Electronic Signature(s) Signed: 03/24/2019 3:29:42 PM By: Army Melia Entered By: Army Melia on 03/24/2019 13:43:15 Maita, [name] ([id number]) -------------------------------------------------------------------------------- Lower Extremity Assessment Details Patient Name: CASEY, FYE. Date of Service: 03/24/2019 12:30 PM Medical Record Number: [id number] Patient Account Number: [id number] Date of Birth/Sex: 1930/12/08 (86 y.o. M) Treating RN: Cornell Barman Primary Care Josha Weekley: Caryl Pina Other Clinician: Referring Yukari Flax: Caryl Pina Treating Jaquila Santelli/Extender: Melburn Hake, [name]: 7 Notes Doppler: could hear on marked areas on right, not on left. Intervention on Tuesday for left leg. Electronic Signature(s) Signed: 04/15/2019 11:50:22 AM By: Gretta Cool, BSN, RN, CWS, Kim RN, BSN Entered By: Gretta Cool, BSN, RN, CWS, Kim on 03/24/2019 13:18:44 Vicknair, [name] ([id number]) -------------------------------------------------------------------------------- Multi Wound Chart Details Patient Name: RIMAS, GILHAM. Date of Service: 03/24/2019 12:30 PM Medical Record Number: [id number] Patient Account Number: [id number] Date of Birth/Sex: 1930/09/30 (86 y.o. M) Treating RN: Army Melia Primary Care Jerritt Cardoza: Caryl Pina Other Clinician: Referring Lekia Nier: Caryl Pina Treating Rashidah Belleville/Extender: Melburn Hake, [name]: 7 Vital Signs Height(in): 68 Pulse(bpm): 84 Weight(lbs): 235 Blood Pressure(mmHg): 115/64 Body Mass Index(BMI): 36 Temperature(F): 97.5 Respiratory Rate 16 (breaths/min): Photos: Wound Location: Right Calcaneus Right, Dorsal Foot Left Calcaneus Wounding Event: Gradually Appeared Gradually Appeared Gradually Appeared Primary Etiology: Pressure Ulcer Arterial Insufficiency Ulcer Arterial Insufficiency Ulcer Comorbid History: N/A N/A N/A Date Acquired: 12/05/2018 02/08/2019  02/08/2019 Weeks of Treatment: 7 1 1  Wound Status: Open Open Open Measurements L x W x D 4.4x4.5x0.1 5.5x1x0.1 2x3x0.1 (cm) Area (cm) : 15.551 4.32 4.712 Volume (cm) : 1.555 0.432 0.471 % Reduction in Area: -121.00% -189.50% 0.00% % Reduction in Volume: 55.80% -189.90% 0.00% Classification: Unstageable/Unclassified Unclassifiable Unclassifiable Exudate Amount: N/A N/A N/A Wound Margin: N/A N/A N/A Granulation Amount: N/A N/A N/A Necrotic Amount: N/A N/A N/A Necrotic Tissue: N/A N/A N/A Epithelialization: N/A N/A N/A Wound Number: [id number]  PhotosCADARIUS, NEVARES ([id number]) Wound Location: Left Metatarsal  head fifth Right Foot - Plantar Right Malleolus - Lateral Wounding Event: Gradually Appeared Gradually Appeared Gradually Appeared Primary Etiology: Arterial Insufficiency Ulcer Arterial Insufficiency Ulcer Arterial Insufficiency Ulcer Comorbid History: N/A Cataracts, Hypertension, Cataracts, Hypertension, Myocardial Infarction, Type II Myocardial Infarction, Type II Diabetes, Osteoarthritis, Diabetes, Osteoarthritis, Neuropathy Neuropathy Date Acquired: 02/08/2019 03/21/2019 03/21/2019 Weeks of Treatment: 1 0 0 Wound Status: Open Open Open Measurements L x W x D 1.5x0.7x0.1 2x3.5x0.1 1x1x0.1 (cm) Area (cm) : 0.825 5.498 0.785 Volume (cm) : 0.082 0.55 0.079 % Reduction in Area: -75.20% 0.00% 0.00% % Reduction in Volume: -74.50% 0.00% 0.00% Classification: Unclassifiable Unclassifiable Unclassifiable Exudate Amount: N/A None Present N/A Wound Margin: N/A N/A Indistinct, nonvisible Granulation Amount: N/A None Present (0%) None Present (0%) Necrotic Amount: N/A Large (67-100%) Large (67-100%) Necrotic Tissue: N/A Eschar Eschar Epithelialization: N/A N/A None Wound Number: 2 3 4  Photos: Wound Location: Right Toe Fourth Right Toe Fifth Left, Lateral Toe Third Wounding Event: Gradually Appeared Pressure Injury Gradually Appeared Primary Etiology: Pressure Ulcer Pressure  Ulcer Diabetic Wound/Ulcer of the Lower Extremity Comorbid History: N/A N/A N/A Date Acquired: 09/05/2018 09/05/2018 03/03/2019 Weeks of Treatment: 7 7 3  Wound Status: Open Open Open Measurements L x W x D 0.4x0.4x0.1 1x0.9x0.1 1.5x0.9x0.1 (cm) Area (cm) : 0.126 0.707 1.06 Volume (cm) : 0.013 0.071 0.106 % Reduction in Area: -34.00% -125.20% -1027.70% % Reduction in Volume: -44.40% -129.00% -1077.80% Classification: Unstageable/Unclassified Unstageable/Unclassified Grade 1 Exudate Amount: N/A N/A N/A Wound Margin: N/A N/A N/A Granulation Amount: N/A N/A N/A Necrotic Amount: N/A N/A N/A Necrotic Tissue: N/A N/A N/A Epithelialization: N/A N/A N/A Wound Number: [id number] 6 7  WILFRED, DAYRIT ([id number]) Photos: Wound Location: Left, Medial Toe Fourth Left, Lateral Toe Fourth Left, Medial Toe Fifth Wounding Event: Gradually Appeared Gradually Appeared Gradually Appeared Primary Etiology: Diabetic Wound/Ulcer of the Diabetic Wound/Ulcer of the Diabetic Wound/Ulcer of the Lower Extremity Lower Extremity Lower Extremity Comorbid History: N/A N/A N/A Date Acquired: 03/03/2019 03/03/2019 03/03/2019 Weeks of Treatment: 3 3 3  Wound Status: Open Open Open Measurements L x W x D 0.8x0.5x0.1 1.5x1x0.3 1.2x0.9x0.1 (cm) Area (cm) : 0.314 1.178 0.848 Volume (cm) : 0.031 0.353 0.085 % Reduction in Area: -90.30% 3.80% 18.20% % Reduction in Volume: -93.70% -44.10% 18.30% Classification: Grade 1 Grade 1 Grade 1 Exudate Amount: N/A N/A N/A Wound Margin: N/A N/A N/A Granulation Amount: N/A N/A N/A Necrotic Amount: N/A N/A N/A Necrotic Tissue: N/A N/A N/A Epithelialization: N/A N/A N/A Wound Number: [id number] N/A Photos: N/A Wound Location: Right Achilles Right, Lateral Foot N/A Wounding Event: Gradually Appeared Gradually Appeared N/A Primary Etiology: Arterial Insufficiency Ulcer Arterial Insufficiency Ulcer N/A Comorbid History: N/A N/A N/A Date Acquired: 02/08/2019 02/08/2019 N/A Weeks of Treatment: 1 1  N/A Wound Status: Open Open N/A Measurements L x W x D 11x4x0.7 2x2.5x0.1 N/A (cm) Area (cm) : 34.558 3.927 N/A Volume (cm) : 24.19 0.393 N/A % Reduction in Area: 2.20% -96.10% N/A % Reduction in Volume: 2.20% -96.50% N/A Classification: Full Thickness Without Unclassifiable N/A Exposed Support Structures Exudate Amount: N/A N/A N/A Wound Margin: N/A N/A N/A CHRISTOPHOR, EICK ([id number]) Granulation Amount: N/A N/A N/A Necrotic Amount: N/A N/A N/A Necrotic Tissue: N/A N/A N/A Epithelialization: N/A N/A N/A Treatment Notes Electronic Signature(s) Signed: 03/24/2019 3:29:42 PM By: Army Melia Entered By: Army Melia on 03/24/2019 13:32:51 Ghee, [name] ([id number]) -------------------------------------------------------------------------------- [place] Details Patient Name: BURNICE, OESTREICHER. Date of Service: 03/24/2019 12:30 PM Medical Record Number: [id number] Patient Account Number: [id number] Date of Birth/Sex: 28-May-1930 (86  y.o. M) Treating RN: Army Melia Primary Care Disa Riedlinger: Caryl Pina Other Clinician: Referring Manasa Spease: Caryl Pina Treating Traniya Prichett/Extender: Melburn Hake, [name]: 7 Active Inactive Orientation to the Wound Care Program Nursing Diagnoses: Knowledge deficit related to the wound healing center program Goals: Patient/caregiver will verbalize understanding of the [facility] Program Date Initiated: 02/03/2019 Target Resolution Date: 03/04/2019 Goal Status: Active Interventions: Provide education on orientation to the wound center Notes: Pressure Nursing Diagnoses: Knowledge deficit related to causes and risk factors for pressure ulcer development Goals: Patient/caregiver will verbalize risk factors for pressure ulcer development Date Initiated: 02/03/2019 Target Resolution Date: 03/04/2019 Goal Status: Active Interventions: Assess: immobility, friction, shearing, incontinence upon  admission and as needed Notes: Wound/Skin Impairment Nursing Diagnoses: Impaired tissue integrity Goals: Ulcer/skin breakdown will have a volume reduction of 30% by week 4 Date Initiated: 02/03/2019 Target Resolution Date: 03/04/2019 Goal Status: Active Interventions: Assess ulceration(s) every visit LOYED, WILMES ([id number]) Notes: Electronic Signature(s) Signed: 03/24/2019 3:29:42 PM By: Army Melia Entered By: Army Melia on 03/24/2019 13:32:37 Wiles, [name] ([id number]) -------------------------------------------------------------------------------- Pain Assessment Details Patient Name: JOHNTE, PORTNOY. Date of Service: 03/24/2019 12:30 PM Medical Record Number: [id number] Patient Account Number: [id number] Date of Birth/Sex: 05-17-1930 (86 y.o. M) Treating RN: Cornell Barman Primary Care Lileigh Fahringer: Caryl Pina Other Clinician: Referring Keirstan Iannello: Caryl Pina Treating Amaan Meyer/Extender: Melburn Hake, [name]: 7 Active Problems Location of Pain Severity and Description of Pain Patient Has Paino No Site Locations Pain Management and Medication Current Pain Management: Notes Patient denies pain at this time. Electronic Signature(s) Signed: 04/15/2019 11:50:22 AM By: Gretta Cool, BSN, RN, CWS, Kim RN, BSN Entered By: Gretta Cool, BSN, RN, CWS, Kim on 03/24/2019 12:48:42 Jacqualyn Posey ([id number]) -------------------------------------------------------------------------------- Patient/Caregiver Education Details Patient Name: AREG, BIALAS. Date of Service: 03/24/2019 12:30 PM Medical Record Number: [id number] Patient Account Number: [id number] Date of Birth/Gender: 10-Aug-1930 (86 y.o. M) Treating RN: Army Melia Primary Care Physician: Caryl Pina Other Clinician: Referring Physician: Caryl Pina Treating Physician/Extender: Sharalyn Ink in Treatment: 7 Education Assessment Education Provided To: Patient Education Topics  Provided Wound/Skin Impairment: Handouts: Caring for Your Ulcer Methods: Demonstration, Explain/Verbal Responses: State content correctly Electronic Signature(s) Signed: 03/24/2019 3:29:42 PM By: Army Melia Entered By: Army Melia on 03/24/2019 13:42:19 Burkard, [name] ([id number]) -------------------------------------------------------------------------------- Wound Assessment Details Patient Name: LAIN, TETTERTON. Date of Service: 03/24/2019 12:30 PM Medical Record Number: [id number] Patient Account Number: [id number] Date of Birth/Sex: 05-23-1930 (86 y.o. M) Treating RN: Cornell Barman Primary Care Celedonio Sortino: Caryl Pina Other Clinician: Referring Gomer France: Caryl Pina Treating Lorali Khamis/Extender: Melburn Hake, [name]: 7 Wound Status Wound Number: 1 Primary Etiology: Pressure Ulcer Wound Location: Right Calcaneus Wound Status: Open Wounding Event: Gradually Appeared Date Acquired: 12/05/2018 Weeks Of Treatment: 7 Clustered Wound: No Photos Photo Uploaded By: Gretta Cool, BSN, RN, CWS, Kim on 03/24/2019 13:06:18 Wound Measurements Length: (cm) 4.4 Width: (cm) 4.5 Depth: (cm) 0.1 Area: (cm) 15.551 Volume: (cm) 1.555 % Reduction in Area: -121% % Reduction in Volume: 55.8% Wound Description Classification: Unstageable/Unclassified Electronic Signature(s) Signed: 04/15/2019 11:50:22 AM By: Gretta Cool, BSN, RN, CWS, Kim RN, BSN Entered By: Gretta Cool, BSN, RN, CWS, Kim on 03/24/2019 12:59:00 Rumberger, [name] ([id number]) -------------------------------------------------------------------------------- Wound Assessment Details Patient Name: BHAVESH, VAZQUEZ. Date of Service: 03/24/2019 12:30 PM Medical Record Number: [id number] Patient Account Number: [id number] Date of Birth/Sex: [birth year] (86 y.o. M) Treating RN: Cornell Barman Primary Care Mi Balla: Caryl Pina Other Clinician: Referring Eliza Grissinger: Caryl Pina Treating Shakeerah Gradel/Extender: Melburn Hake,  HOYT  Weeks in Treatment: 7 Wound Status Wound Number: 10 Primary Etiology: Arterial Insufficiency Ulcer Wound Location: Right, Dorsal Foot Wound Status: Open Wounding Event: Gradually Appeared Date Acquired: 02/08/2019 Weeks Of Treatment: 1 Clustered Wound: No Photos Photo Uploaded By: Gretta Cool, BSN, RN, CWS, Kim on 03/24/2019 13:06:19 Wound Measurements Length: (cm) 5.5 Width: (cm) 1 Depth: (cm) 0.1 Area: (cm) 4.32 Volume: (cm) 0.432 % Reduction in Area: -189.5% % Reduction in Volume: -189.9% Wound Description Classification: Unclassifiable Electronic Signature(s) Signed: 04/15/2019 11:50:22 AM By: Gretta Cool, BSN, RN, CWS, Kim RN, BSN Entered By: Gretta Cool, BSN, RN, CWS, Kim on 03/24/2019 12:59:00 Zappulla, [name] ([id number]) -------------------------------------------------------------------------------- Wound Assessment Details Patient Name: BARTT, GONZAGA. Date of Service: 03/24/2019 12:30 PM Medical Record Number: [id number] Patient Account Number: [id number] Date of Birth/Sex: 05-05-1930 (86 y.o. M) Treating RN: Cornell Barman Primary Care Sharice Harriss: Caryl Pina Other Clinician: Referring Kaira Stringfield: Caryl Pina Treating Kimmerly Lora/Extender: Melburn Hake, [name]: 7 Wound Status Wound Number: 11 Primary Etiology: Arterial Insufficiency Ulcer Wound Location: Left Calcaneus Wound Status: Open Wounding Event: Gradually Appeared Date Acquired: 02/08/2019 Weeks Of Treatment: 1 Clustered Wound: No Photos Photo Uploaded By: Gretta Cool, BSN, RN, CWS, Kim on 03/24/2019 13:07:07 Wound Measurements Length: (cm) 2 % Reduct Width: (cm) 3 % Reduct Depth: (cm) 0.1 Area: (cm) 4.712 Volume: (cm) 0.471 ion in Area: 0% ion in Volume: 0% Wound Description Classification: Unclassifiable Electronic Signature(s) Signed: 04/15/2019 11:50:22 AM By: Gretta Cool, BSN, RN, CWS, Kim RN, BSN Entered By: Gretta Cool, BSN, RN, CWS, Kim on 03/24/2019 13:00:08 Jacqualyn Posey  ([id number]) -------------------------------------------------------------------------------- Wound Assessment Details Patient Name: WELLS, MABE. Date of Service: 03/24/2019 12:30 PM Medical Record Number: [id number] Patient Account Number: [id number] Date of Birth/Sex: 1930-05-21 (86 y.o. M) Treating RN: Cornell Barman Primary Care Darriona Dehaas: Caryl Pina Other Clinician: Referring Neely Cecena: Caryl Pina Treating Priya Matsen/Extender: Melburn Hake, [name]: 7 Wound Status Wound Number: 12 Primary Etiology: Arterial Insufficiency Ulcer Wound Location: Left Metatarsal head fifth Wound Status: Open Wounding Event: Gradually Appeared Date Acquired: 02/08/2019 Weeks Of Treatment: 1 Clustered Wound: No Photos Photo Uploaded By: Gretta Cool, BSN, RN, CWS, Kim on 03/24/2019 13:07:09 Wound Measurements Length: (cm) 1.5 Width: (cm) 0.7 Depth: (cm) 0.1 Area: (cm) 0.825 Volume: (cm) 0.082 % Reduction in Area: -75.2% % Reduction in Volume: -74.5% Wound Description Classification: Unclassifiable Electronic Signature(s) Signed: 04/15/2019 11:50:22 AM By: Gretta Cool, BSN, RN, CWS, Kim RN, BSN Entered By: Gretta Cool, BSN, RN, CWS, Kim on 03/24/2019 13:00:09 Fariss, [name] ([id number]) -------------------------------------------------------------------------------- Wound Assessment Details Patient Name: DARIN, ARNDT. Date of Service: 03/24/2019 12:30 PM Medical Record Number: [id number] Patient Account Number: [id number] Date of Birth/Sex: [birth year] (86 y.o. M) Treating RN: Cornell Barman Primary Care Niguel Moure: Caryl Pina Other Clinician: Referring Fahd Galea: Caryl Pina Treating Odella Appelhans/Extender: Melburn Hake, [name]: 7 Wound Status Wound Number: 25 Primary Arterial Insufficiency Ulcer Etiology: Wound Location: Right Foot - Plantar Wound Open Wounding Event: Gradually Appeared Status: Date Acquired: 03/21/2019 Comorbid Cataracts, Hypertension,  Myocardial Infarction, Weeks Of Treatment: 0 History: Type II Diabetes, Osteoarthritis, Neuropathy Clustered Wound: No Photos Wound Measurements Length: (cm) 2 Width: (cm) 3.5 Depth: (cm) 0.1 Area: (cm) 5.498 Volume: (cm) 0.55 % Reduction in Area: 0% % Reduction in Volume: 0% Wound Description Classification: Unclassifiable Exudate Amount: None Present Wound Bed Granulation Amount: None Present (0%) Necrotic Amount: Large (67-100%) Necrotic Quality: Eschar Electronic Signature(s) Signed: 04/15/2019 11:50:22 AM By: Gretta Cool, BSN, RN, CWS, Kim RN, BSN Entered By: Gretta Cool, BSN, RN, CWS, Kim on 03/24/2019 13:14:15 Kil, [name]. (  [id number]) -------------------------------------------------------------------------------- Wound Assessment Details Patient Name: COLBY, [name]. Date of Service: 03/24/2019 12:30 PM Medical Record Number: [id number] Patient Account Number: [id number] Date of Birth/Sex: 1930/11/14 (86 y.o. M) Treating RN: Cornell Barman Primary Care Garan Frappier: Caryl Pina Other Clinician: Referring Arlando Leisinger: Caryl Pina Treating Ryaan Vanwagoner/Extender: Melburn Hake, [name]: 7 Wound Status Wound Number: 14 Primary Arterial Insufficiency Ulcer Etiology: Wound Location: Right Malleolus - Lateral Wound Open Wounding Event: Gradually Appeared Status: Date Acquired: 03/21/2019 Comorbid Cataracts, Hypertension, Myocardial Infarction, Weeks Of Treatment: 0 History: Type II Diabetes, Osteoarthritis, Neuropathy Clustered Wound: No Photos Wound Measurements Length: (cm) 1 Width: (cm) 1 Depth: (cm) 0.1 Area: (cm) 0.785 Volume: (cm) 0.079 % Reduction in Area: 0% % Reduction in Volume: 0% Epithelialization: None Wound Description Classification: Unclassifiable Wound Margin: Indistinct, nonvisible Wound Bed Granulation Amount: None Present (0%) Necrotic Amount: Large (67-100%) Necrotic Quality: Eschar Electronic Signature(s) Signed: 04/15/2019  11:50:22 AM By: Gretta Cool, BSN, RN, CWS, Kim RN, BSN Entered By: Gretta Cool, BSN, RN, CWS, Kim on 03/24/2019 13:14:44 Bilton, [name] ([id number]) -------------------------------------------------------------------------------- Wound Assessment Details Patient Name: GEORDAN, XU. Date of Service: 03/24/2019 12:30 PM Medical Record Number: [id number] Patient Account Number: [id number] Date of Birth/Sex: 1930-04-23 (86 y.o. M) Treating RN: Cornell Barman Primary Care Jaequan Propes: Caryl Pina Other Clinician: Referring Arles Rumbold: Caryl Pina Treating Jeanet Lupe/Extender: Melburn Hake, [name]: 7 Wound Status Wound Number: 2 Primary Etiology: Pressure Ulcer Wound Location: Right Toe Fourth Wound Status: Open Wounding Event: Gradually Appeared Date Acquired: 09/05/2018 Weeks Of Treatment: 7 Clustered Wound: No Photos Photo Uploaded By: Gretta Cool, BSN, RN, CWS, Kim on 03/24/2019 13:07:46 Wound Measurements Length: (cm) 0.4 Width: (cm) 0.4 Depth: (cm) 0.1 Area: (cm) 0.126 Volume: (cm) 0.013 % Reduction in Area: -34% % Reduction in Volume: -44.4% Wound Description Classification: Unstageable/Unclassified Electronic Signature(s) Signed: 04/15/2019 11:50:22 AM By: Gretta Cool, BSN, RN, CWS, Kim RN, BSN Entered By: Gretta Cool, BSN, RN, CWS, Kim on 03/24/2019 13:00:47 Trombetta, [name] ([id number]) -------------------------------------------------------------------------------- Wound Assessment Details Patient Name: BORDEN, THUNE. Date of Service: 03/24/2019 12:30 PM Medical Record Number: [id number] Patient Account Number: [id number] Date of Birth/Sex: 06-14-1930 (86 y.o. M) Treating RN: Cornell Barman Primary Care Iya Hamed: Caryl Pina Other Clinician: Referring Karyl Sharrar: Caryl Pina Treating Emile Ringgenberg/Extender: Melburn Hake, [name]: 7 Wound Status Wound Number: 3 Primary Etiology: Pressure Ulcer Wound Location: Right Toe Fifth Wound Status: Open Wounding  Event: Pressure Injury Date Acquired: 09/05/2018 Weeks Of Treatment: 7 Clustered Wound: No Photos Photo Uploaded By: Gretta Cool, BSN, RN, CWS, Kim on 03/24/2019 13:07:47 Wound Measurements Length: (cm) 1 Width: (cm) 0.9 Depth: (cm) 0.1 Area: (cm) 0.707 Volume: (cm) 0.071 % Reduction in Area: -125.2% % Reduction in Volume: -129% Wound Description Classification: Unstageable/Unclassified Electronic Signature(s) Signed: 04/15/2019 11:50:22 AM By: Gretta Cool, BSN, RN, CWS, Kim RN, BSN Entered By: Gretta Cool, BSN, RN, CWS, Kim on 03/24/2019 13:00:47 Bonnette, [name] ([id number]) -------------------------------------------------------------------------------- Wound Assessment Details Patient Name: ELLERY, MERONEY. Date of Service: 03/24/2019 12:30 PM Medical Record Number: [id number] Patient Account Number: [id number] Date of Birth/Sex: 27-Dec-1930 (86 y.o. M) Treating RN: Cornell Barman Primary Care Daryan Buell: Caryl Pina Other Clinician: Referring Carlye Panameno: Caryl Pina Treating Tyshan Enderle/Extender: STONE III, [name]: 7 Wound Status Wound Number: 4 Primary Diabetic Wound/Ulcer of the Lower Etiology: Extremity Wound Location: Left, Lateral Toe Third Wound Status: Open Wounding Event: Gradually Appeared Date Acquired: 03/03/2019 Weeks Of Treatment: 3 Clustered Wound: No Photos Photo Uploaded By: Gretta Cool, BSN, RN, CWS, Kim on 03/24/2019 13:08:21 Wound Measurements Length: (cm) 1.5  Width: (cm) 0.9 Depth: (cm) 0.1 Area: (cm) 1.06 Volume: (cm) 0.106 % Reduction in Area: -1027.7% % Reduction in Volume: -1077.8% Wound Description Classification: Grade 1 Electronic Signature(s) Signed: 04/15/2019 11:50:22 AM By: Gretta Cool, BSN, RN, CWS, Kim RN, BSN Entered By: Gretta Cool, BSN, RN, CWS, Kim on 03/24/2019 13:02:14 Fissel, [name] ([id number]) -------------------------------------------------------------------------------- Wound Assessment Details Patient Name: CLAIBORNE, STROBLE. Date of Service: 03/24/2019 12:30 PM Medical Record Number: [id number] Patient Account Number: [id number] Date of Birth/Sex: 06/07/1930 (86 y.o. M) Treating RN: Cornell Barman Primary Care Tekoa Hamor: Caryl Pina Other Clinician: Referring Amilcar Reever: Caryl Pina Treating Rhia Blatchford/Extender: Melburn Hake, [name]: 7 Wound Status Wound Number: 5 Primary Diabetic Wound/Ulcer of the Lower Etiology: Extremity Wound Location: Left, Medial Toe Fourth Wound Status: Open Wounding Event: Gradually Appeared Date Acquired: 03/03/2019 Weeks Of Treatment: 3 Clustered Wound: No Photos Photo Uploaded By: Gretta Cool, BSN, RN, CWS, Kim on 03/24/2019 13:08:22 Wound Measurements Length: (cm) 0.8 Width: (cm) 0.5 Depth: (cm) 0.1 Area: (cm) 0.314 Volume: (cm) 0.031 % Reduction in Area: -90.3% % Reduction in Volume: -93.7% Wound Description Classification: Grade 1 Electronic Signature(s) Signed: 04/15/2019 11:50:22 AM By: Gretta Cool, BSN, RN, CWS, Kim RN, BSN Entered By: Gretta Cool, BSN, RN, CWS, Kim on 03/24/2019 13:02:14 Steinkamp, [name] ([id number]) -------------------------------------------------------------------------------- Wound Assessment Details Patient Name: SEAVER, MACHIA. Date of Service: 03/24/2019 12:30 PM Medical Record Number: [id number] Patient Account Number: [id number] Date of Birth/Sex: [birth year] (86 y.o. M) Treating RN: Cornell Barman Primary Care Patra Gherardi: Caryl Pina Other Clinician: Referring Cougar Imel: Caryl Pina Treating Lue Dubuque/Extender: Melburn Hake, [name]: 7 Wound Status Wound Number: 6 Primary Diabetic Wound/Ulcer of the Lower Etiology: Extremity Wound Location: Left, Lateral Toe Fourth Wound Status: Open Wounding Event: Gradually Appeared Date Acquired: 03/03/2019 Weeks Of Treatment: 3 Clustered Wound: No Photos Photo Uploaded By: Gretta Cool, BSN, RN, CWS, Kim on 03/24/2019 13:08:54 Wound Measurements Length: (cm) 1.5 Width: (cm)  1 Depth: (cm) 0.3 Area: (cm) 1.178 Volume: (cm) 0.353 % Reduction in Area: 3.8% % Reduction in Volume: -44.1% Wound Description Classification: Grade 1 Electronic Signature(s) Signed: 04/15/2019 11:50:22 AM By: Gretta Cool, BSN, RN, CWS, Kim RN, BSN Entered By: Gretta Cool, BSN, RN, CWS, Kim on 03/24/2019 13:02:52 Lorenzo, [name] ([id number]) -------------------------------------------------------------------------------- Wound Assessment Details Patient Name: JEX, STRAUSBAUGH. Date of Service: 03/24/2019 12:30 PM Medical Record Number: [id number] Patient Account Number: [id number] Date of Birth/Sex: 12/14/1930 (86 y.o. M) Treating RN: Cornell Barman Primary Care Monchel Pollitt: Caryl Pina Other Clinician: Referring Nadene Witherspoon: Caryl Pina Treating Alphonse Asbridge/Extender: Melburn Hake, [name]: 7 Wound Status Wound Number: 7 Primary Diabetic Wound/Ulcer of the Lower Etiology: Extremity Wound Location: Left, Medial Toe Fifth Wound Status: Open Wounding Event: Gradually Appeared Date Acquired: 03/03/2019 Weeks Of Treatment: 3 Clustered Wound: No Photos Photo Uploaded By: Gretta Cool, BSN, RN, CWS, Kim on 03/24/2019 13:08:55 Wound Measurements Length: (cm) 1.2 Width: (cm) 0.9 Depth: (cm) 0.1 Area: (cm) 0.848 Volume: (cm) 0.085 % Reduction in Area: 18.2% % Reduction in Volume: 18.3% Wound Description Classification: Grade 1 Electronic Signature(s) Signed: 04/15/2019 11:50:22 AM By: Gretta Cool, BSN, RN, CWS, Kim RN, BSN Entered By: Gretta Cool, BSN, RN, CWS, Kim on 03/24/2019 13:03:25 Fassler, [name] ([id number]) -------------------------------------------------------------------------------- Wound Assessment Details Patient Name: YANDRIEL, BOENING. Date of Service: 03/24/2019 12:30 PM Medical Record Number: [id number] Patient Account Number: [id number] Date of Birth/Sex: 10/07/1930 (86 y.o. M) Treating RN: Cornell Barman Primary Care Cecille Mcclusky: Caryl Pina Other Clinician: Referring  Nolton Denis: Caryl Pina Treating Nealy Karapetian/Extender: STONE III, [name]: 7 Wound Status  Wound Number: 8 Primary Etiology: Arterial Insufficiency Ulcer Wound Location: Right Achilles Wound Status: Open Wounding Event: Gradually Appeared Date Acquired: 02/08/2019 Weeks Of Treatment: 1 Clustered Wound: No Photos Photo Uploaded By: Gretta Cool, BSN, RN, CWS, Kim on 03/24/2019 13:09:24 Wound Measurements Length: (cm) 11 Width: (cm) 4 Depth: (cm) 0.7 Area: (cm) 34.558 Volume: (cm) 24.19 % Reduction in Area: 2.2% % Reduction in Volume: 2.2% Wound Description Full Thickness Without Exposed Support Classification: Structures Electronic Signature(s) Signed: 04/15/2019 11:50:22 AM By: Gretta Cool, BSN, RN, CWS, Kim RN, BSN Entered By: Gretta Cool, BSN, RN, CWS, Kim on 03/24/2019 13:04:43 Argyle, [name] ([id number]) -------------------------------------------------------------------------------- Wound Assessment Details Patient Name: MAHMOOD, BOEHRINGER. Date of Service: 03/24/2019 12:30 PM Medical Record Number: [id number] Patient Account Number: [id number] Date of Birth/Sex: [birth year] (86 y.o. M) Treating RN: Cornell Barman Primary Care Cordarro Spinnato: Caryl Pina Other Clinician: Referring Tynasia Mccaul: Caryl Pina Treating Verita Kuroda/Extender: Melburn Hake, [name]: 7 Wound Status Wound Number: 9 Primary Etiology: Arterial Insufficiency Ulcer Wound Location: Right, Lateral Foot Wound Status: Open Wounding Event: Gradually Appeared Date Acquired: 02/08/2019 Weeks Of Treatment: 1 Clustered Wound: No Photos Photo Uploaded By: Gretta Cool, BSN, RN, CWS, Kim on 03/24/2019 13:09:25 Wound Measurements Length: (cm) 2 Width: (cm) 2.5 Depth: (cm) 0.1 Area: (cm) 3.927 Volume: (cm) 0.393 % Reduction in Area: -96.1% % Reduction in Volume: -96.5% Wound Description Classification: Unclassifiable Electronic Signature(s) Signed: 04/15/2019 11:50:22 AM By: Gretta Cool, BSN, RN, CWS,  Kim RN, BSN Entered By: Gretta Cool, BSN, RN, CWS, Kim on 03/24/2019 13:05:10 Warn, [name] ([id number]) -------------------------------------------------------------------------------- Vitals Details Patient Name: GLENDEL, JAGGERS. Date of Service: 03/24/2019 12:30 PM Medical Record Number: [id number] Patient Account Number: [id number] Date of Birth/Sex: 1930/11/18 (86 y.o. M) Treating RN: Cornell Barman Primary Care Rickey Farrier: Caryl Pina Other Clinician: Referring Serenity Fortner: Caryl Pina Treating Aliannah Holstrom/Extender: Melburn Hake, [name]: 7 Vital Signs Time Taken: 12:49 Temperature (F): 97.5 Height (in): 68 Pulse (bpm): 84 Weight (lbs): 235 Respiratory Rate (breaths/min): 16 Body Mass Index (BMI): 35.7 Blood Pressure (mmHg): 115/64 Reference Range: 80 - 120 mg / dl Electronic Signature(s) Signed: 04/15/2019 11:50:22 AM By: Gretta Cool, BSN, RN, CWS, Kim RN, BSN Entered By: Gretta Cool, BSN, RN, CWS, Kim on 03/24/2019 12:49:22

## 2019-04-15 NOTE — Telephone Encounter (Signed)
Go ahead and do a letter stating that the patient does not need oxygen any more and they can come pickup the supplies. Caryl Pina, MD [facility] 04/15/2019, 12:47 PM

## 2019-04-15 NOTE — Telephone Encounter (Signed)
Pharmacy is needing clarification on orders. Will need to know if dapto is In kg or mg. Also duration for antibiotics.  Will need to confirm with patient if he has been on antidotic or in similar class; If not will need first dose at short stay. [place]

## 2019-04-15 NOTE — Telephone Encounter (Signed)
Received follow up call from [facility]. Patient will need first dose of IV meds at short stay. Will need written orders. Posey

## 2019-04-15 NOTE — Telephone Encounter (Signed)
Won't be able to write paper orders til Monday.  Has this been addressed beyond this?

## 2019-04-15 NOTE — Telephone Encounter (Signed)
Letter ready for pick up. Faxed to [facility] specialty  [phone]

## 2019-04-16 NOTE — Progress Notes (Signed)
   Subjective:  Patient presents today status post excisional debridement with application of wound VAC and bone biopsy right. DOS: 04/08/2019.  Patient states that he is been doing very well.  The pain is been on and off but is relieved with the pain medication.  He has been elevating his foot.  The negative pressure wound VAC is intact with no leaks identified.  No complications or new complaints at this time  Past Medical History:  Diagnosis Date  . Agent orange exposure 1966 or 1971  . Anemia   . Anxiety   . Arthritis    "hands and back" (01/20/2013)  . CAD (coronary artery disease)    native vessel  . Carotid artery disease (HCC)    nonobstructive  . Cataract   . Cecal diverticulitis 2008   drained  . Cellulitis, gluteal    bilateral for the past 6 months/notes 01/20/2013  . Chronic lower back pain   . Chronic renal insufficiency   . Depression   . Diabetes mellitus type II   . Exertional shortness of breath   . HTN (hypertension)   . Hyperlipidemia   . Hypokalemia   . Malnutrition ([place])    protein-calorie  . Myocardial infarction [facility])    "silent; before OHS" (01/20/2013)  . PTSD (post-traumatic stress disorder)   . Spina bifida ([place])   . Urinary incontinence       Objective/Physical Exam Neurovascular status intact.  Wound VAC left intact today.  No active bleeding noted.  Radiographic Exam:  Osteolysis noted posterior tubercle of the calcaneus.  Consistent with OM.  Vascular clips noted along the anterior aspect of the ankle.  Generalized osseous demineralization consistent with osteoporosis  Assessment: 1. s/p debridement of posterior heel and Achilles ulcer with application of negative pressure wound VAC and bone biopsy right. DOS: 04/08/2019   Plan of Care:  1. Patient was evaluated. X-rays reviewed 2.  Today the negative pressure wound VAC was left intact.  The patient did not have supplies with him today for a wound VAC change 3.  Order placed for home  health wound VAC dressing changes 2 times per week via [facility] 4.  Patient has follow-up with [facility] wound care center on Thursday, 04/14/2019.,  Hand management and care of the wounds over to the wound care center 5.  Continue management of OM via infectious disease 4.  Return to clinic in 4 weeks for follow-up   Edrick Kins, DPM [facility]  Dr. Edrick Kins, [address]                                        [place], [place] [address]                Office [phone]  Fax [phone]

## 2019-04-18 ENCOUNTER — Encounter (INDEPENDENT_AMBULATORY_CARE_PROVIDER_SITE_OTHER): Payer: Medicare Other

## 2019-04-18 ENCOUNTER — Ambulatory Visit (INDEPENDENT_AMBULATORY_CARE_PROVIDER_SITE_OTHER): Payer: Medicare Other | Admitting: Vascular Surgery

## 2019-04-18 ENCOUNTER — Telehealth: Payer: Self-pay

## 2019-04-18 NOTE — Telephone Encounter (Signed)
Received call back from [facility] at [facility]. Ceftaroline will cost patient $160.11 per day. Duration is unknown; will reach out to Dr. Johnnye Sima to confirm, and call family to discuss plan.  [name] Lorita Officer, RN

## 2019-04-18 NOTE — Telephone Encounter (Signed)
Received call from [facility] at [facility] for order confirmation. Orders received were for daptomycin and ceftriaxone. Daily cost for patient would be roughly $257. Spoke with Dr. Johnnye Sima for alternatives due to cost. Mal Amabile and asked for her to check cost for ceftaroline 600mg  Q12H, and cancel orders for ceftriaxone and daptomycin. Awaiting call back with costs. If lower, will fax new orders.   Braidon Chermak Lorita Officer, RN

## 2019-04-18 NOTE — Telephone Encounter (Signed)
Faxed Short Stay and [facility] provider orders for antibiotics per Dr. Johnnye Sima.   Harli Engelken Lorita Officer, RN

## 2019-04-18 NOTE — Telephone Encounter (Signed)
He needs 6 weeks of therapy.  If that is the co-pay, lets do the ceftriaxone (1 g ivpb qday) and dalbavancin (pharmacy to dose).  thanks

## 2019-04-19 NOTE — Telephone Encounter (Addendum)
If we do the ceftriaxone and daptomycin it will cost him $257 a day vs $160 a day for the ceftaroline. Just want to double check so I can confirm orders with [facility]. Thanks!  Khalen Styer Lorita Officer, RN

## 2019-04-19 NOTE — Telephone Encounter (Signed)
How about dalbavancin once weekly, ceftriaxone daily?

## 2019-04-19 NOTE — Telephone Encounter (Signed)
Received call today from Short Stay requesting patient call to schedule appointment.  [place]

## 2019-04-19 NOTE — Telephone Encounter (Signed)
[name] from [facility] called office in regards to patient's cost for Dalbavancin. States that patient's out of pocket expense for Dalbavancin is $450.89 for one dose. Rocephin is $47.55 per week. Would like to know if Md would like to have patient have Dalbavancin infusion at short stay and have rocephin at home to help with cost. Will forward message to MD to advise. Flasher

## 2019-04-19 NOTE — Telephone Encounter (Signed)
Called [facility] to see costs associated. Will let you know once I hear back.   Nason Conradt Lorita Officer, RN

## 2019-04-20 ENCOUNTER — Ambulatory Visit: Payer: Medicare Other | Admitting: Infectious Diseases

## 2019-04-20 ENCOUNTER — Telehealth: Payer: Self-pay | Admitting: *Deleted

## 2019-04-20 ENCOUNTER — Encounter: Payer: Medicare Other | Admitting: Podiatry

## 2019-04-20 ENCOUNTER — Telehealth: Payer: Self-pay | Admitting: Pharmacist

## 2019-04-20 MED ORDER — DALVANCE 500 MG IV SOLR: 500.0000 mg | INTRAVENOUS | Status: DC

## 2019-04-20 NOTE — Telephone Encounter (Signed)
Sent in patient assistance application for Dalbavancin. Patient will need 1000 mg x 1 then 500 mg once weekly infusions x 6 weeks. His copay is $450 each week. Will update Dorie, RN and Dr. Johnnye Sima when decision for assistance has been decided.

## 2019-04-20 NOTE — Telephone Encounter (Signed)
Let's do the once weekly dalbavancin (at short stay) and daily ceftriaxone Thanks jeff

## 2019-04-20 NOTE — Telephone Encounter (Signed)
-----   Message from Edrick Kins, DPM sent at 04/12/2019  8:19 AM EST ----- Regarding: Home health nurse wound vac changes Please order home health nurse wound vac changes 2x/week x 6 weeks via [facility]. The nurse is actually going out today to do the wound vac change. She just needs an official order. Patient already has the wound vac and dressing supplies as well.  Dx: DFU right achilles ulcer.   Thanks, Dr. Amalia Hailey

## 2019-04-20 NOTE — Telephone Encounter (Signed)
Completed by Cranford Mon, CMA.

## 2019-04-21 NOTE — Telephone Encounter (Signed)
Can you help me know this patient is on anbx? thanks

## 2019-04-21 NOTE — Telephone Encounter (Signed)
All orders faxed to [facility] + short stay. Informed both that we're currently waiting to hear about patient assistance for dalba.   Vickee Mormino Lorita Officer, RN

## 2019-04-22 ENCOUNTER — Ambulatory Visit: Payer: Medicare Other | Admitting: Physician Assistant

## 2019-04-22 ENCOUNTER — Telehealth: Payer: Self-pay

## 2019-04-22 ENCOUNTER — Encounter: Payer: Medicare Other | Admitting: Podiatry

## 2019-04-22 NOTE — Telephone Encounter (Signed)
Received call from [place] that patient has been hospitalized at the [facility]. Stanton Kidney is unsure what he is being treated for but states that he will likely be hospitalized for another week, per his son. Stanton Kidney was in process of getting his antibiotics started this weekend when she received call. Will notify provider.   Wesly Whisenant Lorita Officer, RN

## 2019-04-22 NOTE — Telephone Encounter (Signed)
Thank you :)

## 2019-04-27 ENCOUNTER — Telehealth: Payer: Self-pay | Admitting: Family Medicine

## 2019-04-27 NOTE — Telephone Encounter (Signed)
[place] program referred me to the [place] Patient Assistance Program. A new application was sent to them for processing. The representative said we might need income documents for further processing but the updated timeline for a response was 1-2 business days.   [place] Program 8-5pm CST (p) [phone] ([phone]

## 2019-04-27 NOTE — Chronic Care Management (AMB) (Signed)
  Chronic Care Management   Outreach Note  04/27/2019 Name: [name]. MRN: [id number] DOB: [birth year]  [name]. is a 86 y.o. year old male who is a primary care patient of Dettinger, Fransisca Kaufmann, MD. I reached out to [name]. by phone today in response to a referral sent by Mr. Bobby Posey Jr.'s health plan.     A telephone outreach was attempted today patient was busy and states he will call back. The patient was referred to the case management team for assistance with care management and care coordination.   Follow Up Plan: The care management team will reach out to the patient again over the next 7 days.  If patient returns call to provider office, please advise to call [place] at [phone].  Noreene Larsson, [place], [place], [place] [address] Direct Dial: [phone] [email or web address] Website: [email or web address]

## 2019-04-28 ENCOUNTER — Telehealth: Payer: Self-pay | Admitting: Family Medicine

## 2019-04-28 NOTE — Chronic Care Management (AMB) (Signed)
  Chronic Care Management   Outreach Note  04/28/2019 Name: [name]. MRN: [id number] DOB: [birth year]  [name]. is a 86 y.o. year old male who is a primary care patient of Dettinger, Fransisca Kaufmann, MD. I reached out to [name]. by phone today in response to a referral sent by Mr. Jacqualyn Posey Jr.'s health plan.     A telephone outreach was attempted today spoke with patient, patient stated that someone passed away and would need to call back at a later time.  . The patient was referred to the case management team for assistance with care management and care coordination.   Follow Up Plan: The care management team will reach out to the patient again over the next 7 days.  If patient returns call to provider office, please advise to call [place] at [phone].  Noreene Larsson, [place], [place], [place] [address] Direct Dial: [phone] [email or web address] Website: [email or web address]

## 2019-04-29 NOTE — Telephone Encounter (Signed)
[facility] Patient Advocate Encounter  Application for Dalvance has been approved.    Patient ID # [id number] Effective dates: 04/28/2019 through 02/24/2020 Medication will be shipped directly to the office  [facility] will continue to follow if any further assistance needed.  Reorder of medication outside of the requested 7 vials can be submitted to:  [place] Patient Assistance [place] [address] [place] [address] [phone] 8:00am-5:00pm [phone]

## 2019-04-29 NOTE — Telephone Encounter (Signed)
[place], he is approved finally for dalbavancin patient assistance. I will let you know when the medication arrives to clinic and we can set him up at short stay.

## 2019-05-02 NOTE — Telephone Encounter (Signed)
Last I heard when I spoke with [facility] he was in the [place] hospital. I haven't had a chance to check in and see how he's doing/follow up. I'll keep you and [place] posted.   Bryttany Tortorelli Lorita Officer, RN

## 2019-05-02 NOTE — Telephone Encounter (Signed)
Thanks

## 2019-05-04 ENCOUNTER — Encounter: Payer: Medicare Other | Admitting: Podiatry

## 2019-05-04 NOTE — Telephone Encounter (Signed)
Dalvance has arrived to clinic. Located in pharmacy office.

## 2019-05-06 ENCOUNTER — Encounter: Payer: Medicare Other | Admitting: Podiatry

## 2019-05-10 NOTE — Op Note (Signed)
OPERATIVE REPORT Patient name: [name]. MRN: [id number] DOB: 07-27-1930  DOS:  04/08/2019  Preop Dx: Osteomyelitis right calcaneus.  Ulcer right foot and ankle-multiple. Postop Dx: same  Procedure:  1.  Excisional debridement of wounds, multiple, right foot and ankle 2.  Bone biopsy posterior tubercle of the calcaneus right 3.  Application of negative pressure wound VAC right  Surgeon: Edrick Kins DPM  Anesthesia: 50-50 mixture of 2% lidocaine plain with 0.5% Marcaine plain totaling 59mL infiltrated in the patient's RT lower extremity  Hemostasis: Calf tourniquet without esmarch exsanguination   EBL: 250 mL Materials: None Injectables: None Pathology: Bone biopsy posterior calcaneus for gross and microscopic exam  Condition: The patient tolerated the procedure and anesthesia well. No complications noted or reported   Justification for procedure: The patient is a 86 y.o. male who presents today for surgical correction of chronic ulcers to the posterior aspect of the ankle and lateral aspect of the foot.  Also diagnosed with OM based on MRI posterior calcaneus. All conservative modalities of been unsuccessful in providing any sort of satisfactory alleviation of symptoms with the patient. The patient was told benefits as well as possible side effects of the surgery. The patient consented for surgical correction. The patient consent form was reviewed. All patient questions were answered. No guarantees were expressed or implied. The patient and the surgeon boson the patient consent form with the witness present and placed in the patient's chart.   Procedure in Detail: The patient was brought to the operating room, placed in the operating table in the prone position at which time an aseptic scrub and drape were performed about the patient's respective lower extremity after anesthesia was induced as described above. Attention was then directed to the surgical area where  procedure number one commenced.  Procedure #1: Excisional debridement of wounds, multiple, right foot and ankle including deep fascial layers After preparation of the leg, the wounds to the posterior ankle and lateral aspect of the foot were identified where medically necessary excisional debridement including deep fascial layers was performed using a combination of a surgical #15 blade and tissue nipper.  Excisional debridement of all the necrotic nonviable tissue down to healthier bleeding viable tissue was performed with post debridement measurement same as pre-. Posteriorly to the ankle the ulcer had exposed Achilles tendon and deep muscle and fascial layers.  Posterior ankle ulcer measured approximately 15 cm in length x6 cm in width x1.0 cm in depth.  Another ulcer was identified to the rear foot lateral aspect measuring approximately 2.5 x 2.5 x 0.5 cm.  To the noted ulcerations, there was exposed tendon to the posterior ankle ulcer.  No malodor noted.  There is a moderate amount of slough fibrin and necrotic tissue noted within the ulceration sites.  Periwound integrity was intact. The ulcerations were then copiously irrigated with normal saline and care was taken to ensure there were no active bleeding vessels within the ulceration sites after debridement.  Procedure #2: Bone biopsy posterior tubercle calcaneus right After excisional debridement and copious irrigation of the wounds additional careful dissection was made to the posterior tubercle of the calcaneus just enough to perform a bone biopsy core using a Jamshidi needle.  The bone biopsy was obtained from the posterior tubercle of the calcaneus and placed in sterile specimen container and sent to pathology for gross and microscopic exam.  Procedure #3: Application of negative pressure wound VAC right After excisional debridement of the large open wounds  and bone biopsy, KCI negative pressure wound VAC was applied to the posterior ankle  wound.  Wound VAC was applied in the standard manner and connected to the vacuum system for home use.  Dry sterile compressive dressings were then applied overlying the wound VAC dressing.  The tourniquet which was used for hemostasis was deflated. All normal neurovascular responses including pink color and warmth returned all the digits of patient's lower extremity.  No leaking was identified with the wound VAC.  At the time of discharge the wound VAC was functioning appropriately and maintaining the negative pressure.  The patient was then transferred from the operating room to the recovery room having tolerated the procedure and anesthesia well. All vital signs are stable. After a brief stay in the recovery room the patient was discharged with adequate prescriptions for analgesia. Verbal as well as written instructions were provided for the patient regarding wound care. The patient is to keep the dressings clean dry and intact until they are to follow surgeon Dr. Daylene Katayama in the office upon discharge.   Edrick Kins, DPM [facility]  Dr. Edrick Kins, DPM    [address], [place] [address]                Office [phone]  Fax [phone]

## 2019-05-11 ENCOUNTER — Ambulatory Visit: Payer: Medicare Other | Admitting: Podiatry

## 2019-05-11 ENCOUNTER — Encounter: Payer: Medicare Other | Admitting: Podiatry

## 2019-05-13 NOTE — Chronic Care Management (AMB) (Signed)
  Chronic Care Management   Note  05/13/2019 Name: [name]. MRN: [id number] DOB: 1930/02/29  [place]. is a 86 y.o. year old male who is a primary care patient of Dettinger, Fransisca Kaufmann, MD. I reached out to [name]. by phone today in response to a referral sent by Mr. Jacqualyn Posey Jr.'s health plan.     Mr. Leticia Mcdiarmid Hendricks Milo was given information about Chronic Care Management services today including:  1. CCM service includes personalized support from designated clinical staff supervised by his physician, including individualized plan of care and coordination with other care providers 2. 24/7 contact phone numbers for assistance for urgent and routine care needs. 3. Service will only be billed when office clinical staff spend 20 minutes or more in a month to coordinate care. 4. Only one practitioner may furnish and bill the service in a calendar month. 5. The patient may stop CCM services at any time (effective at the end of the month) by phone call to the office staff. 6. The patient will be responsible for cost sharing (co-pay) of up to 20% of the service fee (after annual deductible is met).  Patient's son Hendricks Milo did not agree to enrollment in care management services and does not wish to consider at this time.Patient Has been placed at a nursing facility.  Follow up plan: The patient's Son  has been provided with contact information for the care management team and has been advised to call with any health related questions or concerns.   Noreene Larsson, [place], [place], [place] [address] Direct Dial: [phone] [email or web address]

## 2019-05-19 ENCOUNTER — Ambulatory Visit: Payer: Medicare Other | Admitting: Family Medicine

## 2019-05-21 ENCOUNTER — Other Ambulatory Visit
Admission: RE | Admit: 2019-05-21 | Discharge: 2019-05-21 | Disposition: A | Discharge status: Home / Self Care | Payer: Medicare Other | Source: Ambulatory Visit | Attending: Family Medicine | Admitting: Family Medicine

## 2019-05-21 DIAGNOSIS — R0989 Other specified symptoms and signs involving the circulatory and respiratory systems: Secondary | ICD-10-CM | POA: Diagnosis not present

## 2019-05-21 DIAGNOSIS — R05 Cough: Secondary | ICD-10-CM | POA: Insufficient documentation

## 2019-05-21 LAB — BASIC METABOLIC PANEL
Anion gap: 9 (ref 5–15)
BUN: 19 mg/dL (ref 8–23)
CO2: 27 mmol/L (ref 22–32)
Calcium: 8.7 mg/dL — ABNORMAL LOW (ref 8.9–10.3)
Chloride: 102 mmol/L (ref 98–111)
Creatinine, Ser: 1.41 mg/dL — ABNORMAL HIGH (ref 0.61–1.24)
GFR calc Af Amer: 52 mL/min — ABNORMAL LOW (ref >60)
GFR calc non Af Amer: 45 mL/min — ABNORMAL LOW (ref >60)
Glucose, Bld: 67 mg/dL — ABNORMAL LOW (ref 70–99)
Potassium: 4.8 mmol/L (ref 3.5–5.1)
Sodium: 138 mmol/L (ref 135–145)

## 2019-05-21 LAB — CBC WITH DIFFERENTIAL/PLATELET
Abs Immature Granulocytes: 0.06 10*3/uL (ref 0.00–0.07)
Basophils Absolute: 0.1 10*3/uL (ref 0.0–0.1)
Basophils Relative: 0 %
Eosinophils Absolute: 0.3 10*3/uL (ref 0.0–0.5)
Eosinophils Relative: 2 %
HCT: 33.9 % — ABNORMAL LOW (ref 39.0–52.0)
Hemoglobin: 10.5 g/dL — ABNORMAL LOW (ref 13.0–17.0)
Immature Granulocytes: 1 %
Lymphocytes Relative: 26 %
Lymphs Abs: 3.2 10*3/uL (ref 0.7–4.0)
MCH: 28.5 pg (ref 26.0–34.0)
MCHC: 31 g/dL (ref 30.0–36.0)
MCV: 92.1 fL (ref 80.0–100.0)
Monocytes Absolute: 1.1 10*3/uL — ABNORMAL HIGH (ref 0.1–1.0)
Monocytes Relative: 9 %
Neutro Abs: 7.4 10*3/uL (ref 1.7–7.7)
Neutrophils Relative %: 62 %
Platelets: 264 10*3/uL (ref 150–400)
RBC: 3.68 MIL/uL — ABNORMAL LOW (ref 4.22–5.81)
RDW: 16.7 % — ABNORMAL HIGH (ref 11.5–15.5)
WBC: 12 10*3/uL — ABNORMAL HIGH (ref 4.0–10.5)
nRBC: 0 % (ref 0.0–0.2)

## 2020-02-29 DIAGNOSIS — K5909 Other constipation: Secondary | ICD-10-CM | POA: Diagnosis not present

## 2020-02-29 DIAGNOSIS — R059 Cough, unspecified: Secondary | ICD-10-CM | POA: Diagnosis not present

## 2020-02-29 DIAGNOSIS — Z03818 Encounter for observation for suspected exposure to other biological agents ruled out: Secondary | ICD-10-CM | POA: Diagnosis not present

## 2020-02-29 DIAGNOSIS — J069 Acute upper respiratory infection, unspecified: Secondary | ICD-10-CM | POA: Diagnosis not present

## 2020-03-02 ENCOUNTER — Other Ambulatory Visit: Payer: Self-pay

## 2020-03-02 ENCOUNTER — Emergency Department: Payer: Medicare Other

## 2020-03-02 ENCOUNTER — Inpatient Hospital Stay
Admission: EM | Admit: 2020-03-02 | Discharge: 2020-03-06 | DRG: 871 | Disposition: A | Discharge status: Skilled Nursing Facility | Payer: Medicare Other | Source: Skilled Nursing Facility | Attending: Internal Medicine | Admitting: Internal Medicine

## 2020-03-02 DIAGNOSIS — E1121 Type 2 diabetes mellitus with diabetic nephropathy: Secondary | ICD-10-CM | POA: Diagnosis present

## 2020-03-02 DIAGNOSIS — I252 Old myocardial infarction: Secondary | ICD-10-CM

## 2020-03-02 DIAGNOSIS — N12 Tubulo-interstitial nephritis, not specified as acute or chronic: Secondary | ICD-10-CM | POA: Diagnosis not present

## 2020-03-02 DIAGNOSIS — Z89611 Acquired absence of right leg above knee: Secondary | ICD-10-CM | POA: Diagnosis not present

## 2020-03-02 DIAGNOSIS — N1832 Chronic kidney disease, stage 3b: Secondary | ICD-10-CM | POA: Insufficient documentation

## 2020-03-02 DIAGNOSIS — R531 Weakness: Secondary | ICD-10-CM | POA: Diagnosis not present

## 2020-03-02 DIAGNOSIS — A419 Sepsis, unspecified organism: Principal | ICD-10-CM | POA: Diagnosis present

## 2020-03-02 DIAGNOSIS — Z9842 Cataract extraction status, left eye: Secondary | ICD-10-CM | POA: Diagnosis not present

## 2020-03-02 DIAGNOSIS — I739 Peripheral vascular disease, unspecified: Secondary | ICD-10-CM | POA: Diagnosis present

## 2020-03-02 DIAGNOSIS — Q059 Spina bifida, unspecified: Secondary | ICD-10-CM | POA: Diagnosis not present

## 2020-03-02 DIAGNOSIS — K5900 Constipation, unspecified: Secondary | ICD-10-CM | POA: Diagnosis present

## 2020-03-02 DIAGNOSIS — Z7982 Long term (current) use of aspirin: Secondary | ICD-10-CM

## 2020-03-02 DIAGNOSIS — I129 Hypertensive chronic kidney disease with stage 1 through stage 4 chronic kidney disease, or unspecified chronic kidney disease: Secondary | ICD-10-CM | POA: Diagnosis present

## 2020-03-02 DIAGNOSIS — Z7984 Long term (current) use of oral hypoglycemic drugs: Secondary | ICD-10-CM

## 2020-03-02 DIAGNOSIS — L89154 Pressure ulcer of sacral region, stage 4: Secondary | ICD-10-CM | POA: Diagnosis present

## 2020-03-02 DIAGNOSIS — E785 Hyperlipidemia, unspecified: Secondary | ICD-10-CM | POA: Diagnosis present

## 2020-03-02 DIAGNOSIS — E1122 Type 2 diabetes mellitus with diabetic chronic kidney disease: Secondary | ICD-10-CM | POA: Diagnosis present

## 2020-03-02 DIAGNOSIS — R1110 Vomiting, unspecified: Secondary | ICD-10-CM | POA: Diagnosis not present

## 2020-03-02 DIAGNOSIS — Z8249 Family history of ischemic heart disease and other diseases of the circulatory system: Secondary | ICD-10-CM

## 2020-03-02 DIAGNOSIS — I2510 Atherosclerotic heart disease of native coronary artery without angina pectoris: Secondary | ICD-10-CM | POA: Diagnosis present

## 2020-03-02 DIAGNOSIS — Z882 Allergy status to sulfonamides status: Secondary | ICD-10-CM

## 2020-03-02 DIAGNOSIS — R0902 Hypoxemia: Secondary | ICD-10-CM | POA: Diagnosis not present

## 2020-03-02 DIAGNOSIS — R509 Fever, unspecified: Secondary | ICD-10-CM | POA: Diagnosis not present

## 2020-03-02 DIAGNOSIS — R059 Cough, unspecified: Secondary | ICD-10-CM | POA: Diagnosis not present

## 2020-03-02 DIAGNOSIS — I10 Essential (primary) hypertension: Secondary | ICD-10-CM | POA: Diagnosis not present

## 2020-03-02 DIAGNOSIS — R279 Unspecified lack of coordination: Secondary | ICD-10-CM | POA: Diagnosis not present

## 2020-03-02 DIAGNOSIS — N10 Acute tubulo-interstitial nephritis: Secondary | ICD-10-CM | POA: Diagnosis present

## 2020-03-02 DIAGNOSIS — L89621 Pressure ulcer of left heel, stage 1: Secondary | ICD-10-CM | POA: Diagnosis present

## 2020-03-02 DIAGNOSIS — E1140 Type 2 diabetes mellitus with diabetic neuropathy, unspecified: Secondary | ICD-10-CM | POA: Diagnosis present

## 2020-03-02 DIAGNOSIS — R0602 Shortness of breath: Secondary | ICD-10-CM | POA: Diagnosis not present

## 2020-03-02 DIAGNOSIS — R070 Pain in throat: Secondary | ICD-10-CM | POA: Diagnosis not present

## 2020-03-02 DIAGNOSIS — R109 Unspecified abdominal pain: Secondary | ICD-10-CM | POA: Diagnosis not present

## 2020-03-02 DIAGNOSIS — N1831 Chronic kidney disease, stage 3a: Secondary | ICD-10-CM

## 2020-03-02 DIAGNOSIS — N183 Chronic kidney disease, stage 3 unspecified: Secondary | ICD-10-CM | POA: Insufficient documentation

## 2020-03-02 DIAGNOSIS — R112 Nausea with vomiting, unspecified: Secondary | ICD-10-CM | POA: Diagnosis not present

## 2020-03-02 DIAGNOSIS — Z880 Allergy status to penicillin: Secondary | ICD-10-CM

## 2020-03-02 DIAGNOSIS — Z9841 Cataract extraction status, right eye: Secondary | ICD-10-CM | POA: Diagnosis not present

## 2020-03-02 DIAGNOSIS — Z87891 Personal history of nicotine dependence: Secondary | ICD-10-CM | POA: Diagnosis not present

## 2020-03-02 DIAGNOSIS — J189 Pneumonia, unspecified organism: Secondary | ICD-10-CM | POA: Diagnosis present

## 2020-03-02 DIAGNOSIS — Z993 Dependence on wheelchair: Secondary | ICD-10-CM

## 2020-03-02 DIAGNOSIS — E1151 Type 2 diabetes mellitus with diabetic peripheral angiopathy without gangrene: Secondary | ICD-10-CM | POA: Diagnosis present

## 2020-03-02 DIAGNOSIS — Z79899 Other long term (current) drug therapy: Secondary | ICD-10-CM

## 2020-03-02 DIAGNOSIS — Z951 Presence of aortocoronary bypass graft: Secondary | ICD-10-CM

## 2020-03-02 DIAGNOSIS — Z20822 Contact with and (suspected) exposure to covid-19: Secondary | ICD-10-CM | POA: Diagnosis present

## 2020-03-02 DIAGNOSIS — Z743 Need for continuous supervision: Secondary | ICD-10-CM | POA: Diagnosis not present

## 2020-03-02 DIAGNOSIS — Z906 Acquired absence of other parts of urinary tract: Secondary | ICD-10-CM

## 2020-03-02 LAB — COMPREHENSIVE METABOLIC PANEL
ALT: 10 U/L (ref 0–44)
AST: 14 U/L — ABNORMAL LOW (ref 15–41)
Albumin: 3.5 g/dL (ref 3.5–5.0)
Alkaline Phosphatase: 76 U/L (ref 38–126)
Anion gap: 12 (ref 5–15)
BUN: 34 mg/dL — ABNORMAL HIGH (ref 8–23)
CO2: 22 mmol/L (ref 22–32)
Calcium: 8.9 mg/dL (ref 8.9–10.3)
Chloride: 100 mmol/L (ref 98–111)
Creatinine, Ser: 1.58 mg/dL — ABNORMAL HIGH (ref 0.61–1.24)
GFR, Estimated: 42 mL/min — ABNORMAL LOW (ref >60)
Glucose, Bld: 242 mg/dL — ABNORMAL HIGH (ref 70–99)
Potassium: 4.7 mmol/L (ref 3.5–5.1)
Sodium: 134 mmol/L — ABNORMAL LOW (ref 135–145)
Total Bilirubin: 0.9 mg/dL (ref 0.3–1.2)
Total Protein: 6.9 g/dL (ref 6.5–8.1)

## 2020-03-02 LAB — URINALYSIS, COMPLETE (UACMP) WITH MICROSCOPIC
Bilirubin Urine: NEGATIVE
Glucose, UA: NEGATIVE mg/dL
Ketones, ur: NEGATIVE mg/dL
Nitrite: NEGATIVE
Protein, ur: 100 mg/dL — AB
Specific Gravity, Urine: 1.011 (ref 1.005–1.030)
pH: 5 (ref 5.0–8.0)

## 2020-03-02 LAB — CBC WITH DIFFERENTIAL/PLATELET
Abs Immature Granulocytes: 0.12 10*3/uL — ABNORMAL HIGH (ref 0.00–0.07)
Basophils Absolute: 0.1 10*3/uL (ref 0.0–0.1)
Basophils Relative: 0 %
Eosinophils Absolute: 0 10*3/uL (ref 0.0–0.5)
Eosinophils Relative: 0 %
HCT: 36.5 % — ABNORMAL LOW (ref 39.0–52.0)
Hemoglobin: 11.7 g/dL — ABNORMAL LOW (ref 13.0–17.0)
Immature Granulocytes: 1 %
Lymphocytes Relative: 4 %
Lymphs Abs: 0.9 10*3/uL (ref 0.7–4.0)
MCH: 29.3 pg (ref 26.0–34.0)
MCHC: 32.1 g/dL (ref 30.0–36.0)
MCV: 91.5 fL (ref 80.0–100.0)
Monocytes Absolute: 1.9 10*3/uL — ABNORMAL HIGH (ref 0.1–1.0)
Monocytes Relative: 10 %
Neutro Abs: 16.8 10*3/uL — ABNORMAL HIGH (ref 1.7–7.7)
Neutrophils Relative %: 85 %
Platelets: 230 10*3/uL (ref 150–400)
RBC: 3.99 MIL/uL — ABNORMAL LOW (ref 4.22–5.81)
RDW: 14.1 % (ref 11.5–15.5)
WBC: 19.8 10*3/uL — ABNORMAL HIGH (ref 4.0–10.5)
nRBC: 0 % (ref 0.0–0.2)

## 2020-03-02 LAB — RESP PANEL BY RT-PCR (FLU A&B, COVID) ARPGX2
Influenza A by PCR: NEGATIVE
Influenza B by PCR: NEGATIVE
SARS Coronavirus 2 by RT PCR: NEGATIVE

## 2020-03-02 LAB — CBG MONITORING, ED: Glucose-Capillary: 152 mg/dL — ABNORMAL HIGH (ref 70–99)

## 2020-03-02 LAB — LACTIC ACID, PLASMA: Lactic Acid, Venous: 1.2 mmol/L (ref 0.5–1.9)

## 2020-03-02 MED ORDER — BISACODYL 10 MG RE SUPP
10.0000 mg | Freq: Once | RECTAL | Status: AC
  Administered 2020-03-02: 10 mg via RECTAL
  Filled 2020-03-02: qty 1

## 2020-03-02 MED ORDER — SODIUM CHLORIDE 0.9 % IV SOLN
2.0000 g | Freq: Once | INTRAVENOUS | Status: AC
  Administered 2020-03-02: 2 g via INTRAVENOUS
  Filled 2020-03-02: qty 20

## 2020-03-02 MED ORDER — ACETAMINOPHEN 325 MG PO TABS
650.0000 mg | ORAL_TABLET | Freq: Once | ORAL | Status: AC
  Administered 2020-03-02: 650 mg via ORAL
  Filled 2020-03-02: qty 2

## 2020-03-02 MED ORDER — ACETAMINOPHEN 650 MG RE SUPP: 650.0000 mg | Freq: Four times a day (QID) | RECTAL | Status: DC | PRN

## 2020-03-02 MED ORDER — INSULIN ASPART 100 UNIT/ML ~~LOC~~ SOLN
0.0000 [IU] | Freq: Every day | SUBCUTANEOUS | Status: DC
  Filled 2020-03-02: qty 1

## 2020-03-02 MED ORDER — ONDANSETRON HCL 4 MG/2ML IJ SOLN
4.0000 mg | Freq: Four times a day (QID) | INTRAMUSCULAR | Status: DC | PRN
  Administered 2020-03-03: 4 mg via INTRAVENOUS
  Filled 2020-03-02: qty 2

## 2020-03-02 MED ORDER — HYDROCODONE-ACETAMINOPHEN 5-325 MG PO TABS: 1.0000 | ORAL_TABLET | ORAL | Status: DC | PRN

## 2020-03-02 MED ORDER — ENOXAPARIN SODIUM 40 MG/0.4ML ~~LOC~~ SOLN
40.0000 mg | SUBCUTANEOUS | Status: DC
  Administered 2020-03-03 – 2020-03-06 (×4): 40 mg via SUBCUTANEOUS
  Filled 2020-03-02 (×4): qty 0.4

## 2020-03-02 MED ORDER — IOHEXOL 9 MG/ML PO SOLN
500.0000 mL | ORAL | Status: AC
  Administered 2020-03-02: 500 mL via ORAL

## 2020-03-02 MED ORDER — SODIUM CHLORIDE 0.9 % IV SOLN
1.0000 g | INTRAVENOUS | Status: DC
  Administered 2020-03-03: 1 g via INTRAVENOUS
  Filled 2020-03-02: qty 1
  Filled 2020-03-02: qty 10

## 2020-03-02 MED ORDER — LACTATED RINGERS IV BOLUS
1000.0000 mL | Freq: Once | INTRAVENOUS | Status: AC
  Administered 2020-03-02: 1000 mL via INTRAVENOUS

## 2020-03-02 MED ORDER — METRONIDAZOLE IN NACL 5-0.79 MG/ML-% IV SOLN
500.0000 mg | Freq: Once | INTRAVENOUS | Status: AC
  Administered 2020-03-02: 500 mg via INTRAVENOUS
  Filled 2020-03-02: qty 100

## 2020-03-02 MED ORDER — POLYETHYLENE GLYCOL 3350 17 G PO PACK
34.0000 g | PACK | Freq: Once | ORAL | Status: AC
  Administered 2020-03-02: 34 g via ORAL
  Filled 2020-03-02: qty 2

## 2020-03-02 MED ORDER — ACETAMINOPHEN 325 MG PO TABS
650.0000 mg | ORAL_TABLET | Freq: Four times a day (QID) | ORAL | Status: DC | PRN
  Administered 2020-03-03 (×2): 650 mg via ORAL
  Filled 2020-03-02 (×2): qty 2

## 2020-03-02 MED ORDER — ONDANSETRON HCL 4 MG PO TABS: 4.0000 mg | ORAL_TABLET | Freq: Four times a day (QID) | ORAL | Status: DC | PRN

## 2020-03-02 MED ORDER — INSULIN ASPART 100 UNIT/ML ~~LOC~~ SOLN
0.0000 [IU] | Freq: Three times a day (TID) | SUBCUTANEOUS | Status: DC
  Administered 2020-03-03: 3 [IU] via SUBCUTANEOUS
  Administered 2020-03-03: 2 [IU] via SUBCUTANEOUS
  Administered 2020-03-04: 3 [IU] via SUBCUTANEOUS
  Administered 2020-03-05 – 2020-03-06 (×3): 2 [IU] via SUBCUTANEOUS
  Administered 2020-03-06: 3 [IU] via SUBCUTANEOUS
  Filled 2020-03-02 (×6): qty 1

## 2020-03-02 MED ORDER — LACTATED RINGERS IV SOLN: INTRAVENOUS | Status: AC

## 2020-03-02 MED ORDER — IOHEXOL 300 MG/ML  SOLN
75.0000 mL | Freq: Once | INTRAMUSCULAR | Status: AC | PRN
  Administered 2020-03-02: 75 mL via INTRAVENOUS

## 2020-03-02 MED ORDER — LACTATED RINGERS IV BOLUS (SEPSIS)
1000.0000 mL | Freq: Once | INTRAVENOUS | Status: AC
  Administered 2020-03-02: 1000 mL via INTRAVENOUS

## 2020-03-02 NOTE — ED Provider Notes (Signed)
[facility] Emergency Department Provider Note ____________________________________________   Event Date/Time   First MD Initiated Contact with Patient 03/02/20 1807     (approximate)  I have reviewed the triage vital signs and the nursing notes.  HISTORY  Chief Complaint Shortness of Breath and Fever   HPI [name]. is a 87 y.o. malewho presents to the ED for evaluation of shortness of breath and fever  Chart review indicates history of DM, HLD, CKD CAD. History of spina bifida.  S/p right AKA. Patient resides at [facility] in the skilled nursing section.  He does not ambulate.  Incontinent of urine and stool at baseline.  Patient presents with his son to the ED for evaluation of 2 weeks of congestion, cough and with addition of emesis today.  Patient reports his congestion and cough have been improving at his facility with the administration of Mucinex, and son reports that he sounds better now than he has a week ago.  Tested negative for Covid multiple times at his facility.  Patient ports today he developed recurrent emesis of nonbloody nonbilious emesis with associated abdominal discomfort and constipation.  Indicates that he cannot recall his last bowel movement.  Denies history of abdominal surgeries.  Past Medical History:  Diagnosis Date  . Agent orange exposure 1966 or 1971  . Anemia   . Anxiety   . Arthritis    "hands and back" (01/20/2013)  . CAD (coronary artery disease)    native vessel  . Carotid artery disease (HCC)    nonobstructive  . Cataract   . Cecal diverticulitis 2008   drained  . Cellulitis, gluteal    bilateral for the past 6 months/notes 01/20/2013  . Chronic lower back pain   . Chronic renal insufficiency   . Depression   . Diabetes mellitus type II   . Exertional shortness of breath   . HTN (hypertension)   . Hyperlipidemia   . Hypokalemia   . Malnutrition ([place])    protein-calorie  . Myocardial  infarction [facility])    "silent; before OHS" (01/20/2013)  . PTSD (post-traumatic stress disorder)   . Spina bifida ([place])   . Urinary incontinence     Patient Active Problem List   Diagnosis Date Noted  . PVD (peripheral vascular disease) ([place]) 03/25/2019  . Osteomyelitis of right foot ([place]) 03/25/2019  . Frequency of urination and polyuria 03/25/2019  . Atherosclerosis of native arteries of the extremities with ulceration ([place]) 03/14/2019  . COVID-19 virus infection 03/14/2019  . Sacral pressure sore 11/25/2018  . Arterial hemorrhage 11/18/2018  . Hypoglycemia associated with type 2 diabetes mellitus ([place]) 11/18/2018  . General weakness 09/22/2016  . Bilateral carpal tunnel syndrome 09/22/2016  . Leukocytosis 07/30/2015  . Urinary incontinence 12/13/2012  . Chronic constipation 11/29/2012  . Metabolic syndrome 29/19/1660  . Spina bifida ([place]) 11/18/2012  . CAD, NATIVE VESSEL 03/08/2009  . Type 2 diabetes mellitus with diabetic nephropathy ([place]) 10/29/2008  . Type 2 diabetes mellitus with stage 3 chronic kidney disease, without long-term current use of insulin ([place]) 10/29/2008  . PTSD 10/29/2008  . Essential hypertension 10/29/2008    Past Surgical History:  Procedure Laterality Date  . APPLICATION OF WOUND VAC Right 04/08/2019   Procedure: APPLICATION OF WOUND VAC;  Surgeon: Edrick Kins, DPM;  Location: [place];  Service: Podiatry;  Laterality: Right;  . BONE BIOPSY Right 04/08/2019   Procedure: BONE BIOPSY;  Surgeon: Edrick Kins, DPM;  Location: [place];  Service: Podiatry;  Laterality: Right;  . CARDIAC CATHETERIZATION  1998   "couple before my OHS" (01/20/2013)  . CARPAL TUNNEL RELEASE Right 1980's  . CATARACT EXTRACTION W/ INTRAOCULAR LENS  IMPLANT, BILATERAL Bilateral ~ 2010  . CORONARY ARTERY BYPASS GRAFT  1998   "CABG X4" (01/20/2013)  . CYSTECTOMY  2000's   "cytal cyst on my intestines; probed then drained it; hospitalized for  13 days; NPO" (01/20/2013)  . EYE SURGERY    . HEMORRHOID BANDING  ~ 10/2012  . IR FLUORO GUIDE CV LINE RIGHT  04/01/2019  . IR US GUIDE VASC ACCESS RIGHT  04/01/2019  . KNEE SURGERY Left 1964   "exploratory; sewed it up w/wire" (01/20/2013)  . LOWER EXTREMITY ANGIOGRAPHY Right 03/22/2019   Procedure: LOWER EXTREMITY ANGIOGRAPHY;  Surgeon: Katha Cabal, MD;  Location: [place] [facility];  Service: Cardiovascular;  Laterality: Right;  . LOWER EXTREMITY ANGIOGRAPHY Left 03/29/2019   Procedure: LOWER EXTREMITY ANGIOGRAPHY;  Surgeon: Katha Cabal, MD;  Location: [place] [facility];  Service: Cardiovascular;  Laterality: Left;  . PROSTATE SURGERY    . SPINE SURGERY  11/24/1932   "Spina bifida surgery"  . STRABISMUS SURGERY Bilateral 03/29/2015   Procedure: REPAIR STRABISMUS BILATERAL;  Surgeon: Lamonte Sakai, MD;  Location: [place];  Service: Ophthalmology;  Laterality: Bilateral;  . WOUND DEBRIDEMENT Right 04/08/2019   Procedure: DEBRIDEMENT WOUND;  Surgeon: Edrick Kins, DPM;  Location: [place];  Service: Podiatry;  Laterality: Right;    Prior to Admission medications   Medication Sig Start Date End Date Taking? Authorizing Provider  aspirin 81 MG tablet Take 81 mg by mouth daily.      [provider]  buPROPion (WELLBUTRIN XL) 300 MG 24 hr tablet Take 1 tablet (300 mg total) by mouth daily. 10/20/18   Dettinger, Fransisca Kaufmann, MD  Cadexomer Iodine (IODOFLEX) 0.9 % PADS Apply 1 application topically See admin instructions. Twice a week heel    [provider]  clopidogrel (PLAVIX) 75 MG tablet Take 1 tablet (75 mg total) by mouth daily. 03/23/19   Schnier, Dolores Lory, MD  Coenzyme Q10 (CO Q-10) 100 MG CAPS Take 1 capsule by mouth 2 (two) times daily.     [provider]  dalbavancin (DALVANCE) 500 MG SOLR Inject 500 mg into the vein as directed. 04/20/19   Kuppelweiser, Cassie L, RPH-CPP  ezetimibe-simvastatin (VYTORIN) 10-20 MG  tablet Take 1 tablet by mouth daily. 10/20/18   Dettinger, Fransisca Kaufmann, MD  glipiZIDE (GLUCOTROL) 10 MG tablet Take 10 mg by mouth daily.    [provider]  guaiFENesin (MUCINEX) 600 MG 12 hr tablet Take 1 tablet (600 mg total) by mouth 2 (two) times daily. 11/02/18   Loman [place], FNP  metoprolol tartrate (LOPRESSOR) 25 MG tablet Take 0.5 tablets (12.5 mg total) by mouth 2 (two) times daily. 12/15/18   Dettinger, Fransisca Kaufmann, MD  Multiple Vitamins-Minerals (PRESERVISION AREDS 2 PO) Take 1 capsule by mouth 2 (two) times daily.    [provider]  nitroGLYCERIN (NITROSTAT) 0.4 MG SL tablet Place 1 tablet (0.4 mg total) under the tongue every 5 (five) minutes as needed. 07/06/13   Chipper Herb, MD  NON FORMULARY Take 1 tablet by mouth daily as needed (Pain). cran-actin 100mg  1 tablet daily    [provider]  nystatin cream (MYCOSTATIN) Apply 1 application topically daily as needed (foot sore).    [provider]  oxyCODONE-acetaminophen (PERCOCET) 5-325 MG tablet Take 1 tablet by mouth every 6 (six) hours as needed for severe pain. 04/08/19   Edrick Kins, DPM  polyethylene glycol powder (GLYCOLAX/MIRALAX) powder Take 17 g by mouth 2 (two) times daily as needed. 12/13/15   Chipper Herb, MD  sitaGLIPtin-metformin (JANUMET) 50-500 MG tablet Take 1 tablet by mouth daily. with food 10/20/18   Dettinger, Fransisca Kaufmann, MD  sodium chloride (OCEAN) 0.65 % SOLN nasal spray Place 1 spray into both nostrils as needed for congestion. 12/07/18   Dettinger, Fransisca Kaufmann, MD    Allergies Sulfonamide derivatives, Aspirin, Nitrofurantoin, Penicillins, and Sulfa antibiotics  Family History  Problem Relation Age of Onset  . Heart disease Mother   . Heart attack Mother   . Heart disease Father   . Heart attack Father   . Cancer Sister        breast  . Cancer Sister        lung and ovarian    Social History Social History   Tobacco Use  . Smoking status: Former Smoker     Packs/day: 2.00    Years: 20.00    Pack years: 40.00    Types: Cigarettes    Start date: 11/20/1948    Quit date: 08/19/1980    Years since quitting: 39.5  . Smokeless tobacco: Former Systems developer    Types: Chew  . Tobacco comment: 01/20/2013 "quit chewing 20 yr ago"  Vaping Use  . Vaping Use: Never used  Substance Use Topics  . Alcohol use: No    Comment: 01/20/2013 "quit drinking 08/19/1980"  . Drug use: No    Review of Systems  Constitutional: Positive for fever/chills Eyes: No visual changes. ENT: No sore throat.  Positive for upper respiratory congestion, improving. Cardiovascular: Denies chest pain. Respiratory: Positive for shortness of breath and cough, improving. Gastrointestinal:   No diarrhea.  Positive for abdominal pain, nausea, emesis and constipation. Genitourinary: Negative for dysuria.  Positive for incontinence of urine Musculoskeletal: Negative for back pain. Skin: Negative for rash. Neurological: Negative for headaches, focal weakness or numbness.  ____________________________________________   PHYSICAL EXAM:  VITAL SIGNS: Vitals:   03/02/20 1955 03/02/20 2000  BP:  112/60  Pulse:  80  Resp:  (!) 28  Temp:    SpO2: 94% 93%     Constitutional: Alert and oriented. Well appearing and in no acute distress.  Pleasant and conversational in full sentences. Eyes: Conjunctivae are normal. PERRL. EOMI. Head: Atraumatic. Nose: No congestion/rhinnorhea. Mouth/Throat: Mucous membranes are moist.  Oropharynx non-erythematous. Neck: No stridor. No cervical spine tenderness to palpation. Cardiovascular: Normal rate, regular rhythm. Grossly normal heart sounds.  Good peripheral circulation. Respiratory: Normal respiratory effort.  No retractions. Lungs CTAB. Gastrointestinal: Soft , nondistended.  Diffuse tenderness, primarily to the lower quadrants, voluntary guarding is present.  No peritonitis appreciated.  Patient reports mild bilateral CVA tenderness upon  palpation. Musculoskeletal: No lower extremity tenderness nor edema.  No joint effusions. No signs of acute trauma. Well-healed right-sided AKA. Wounds to the left heel, left lateral foot and left forearm without erythema, induration, purulence or fluctuance. Neurologic:  Normal speech and language. No gross focal neurologic deficits are appreciated. Skin:  Skin is warm, dry and intact. No rash noted. Psychiatric: Mood and affect are normal. Speech and behavior are normal. ____________________________________________   LABS (all labs ordered are listed, but only abnormal results are displayed)  Labs Reviewed  COMPREHENSIVE METABOLIC PANEL - Abnormal; Notable for the following components:  Result Value   Sodium 134 (*)    Glucose, Bld 242 (*)    BUN 34 (*)    Creatinine, Ser 1.58 (*)    AST 14 (*)    GFR, Estimated 42 (*)    All other components within normal limits  CBC WITH DIFFERENTIAL/PLATELET - Abnormal; Notable for the following components:   WBC 19.8 (*)    RBC 3.99 (*)    Hemoglobin 11.7 (*)    HCT 36.5 (*)    Neutro Abs 16.8 (*)    Monocytes Absolute 1.9 (*)    Abs Immature Granulocytes 0.12 (*)    All other components within normal limits  URINALYSIS, COMPLETE (UACMP) WITH MICROSCOPIC - Abnormal; Notable for the following components:   Color, Urine YELLOW (*)    APPearance HAZY (*)    Hgb urine dipstick MODERATE (*)    Protein, ur 100 (*)    Leukocytes,Ua LARGE (*)    Bacteria, UA RARE (*)    All other components within normal limits  CULTURE, BLOOD (SINGLE)  RESP PANEL BY RT-PCR (FLU A&B, COVID) ARPGX2  URINE CULTURE  LACTIC ACID, PLASMA  LACTIC ACID, PLASMA   ____________________________________________  12 Lead EKG  Sinus rhythm, rate of 88 bpm. Normal axis. Prolonged QTC of 572. No evidence of acute ischemia. ____________________________________________  RADIOLOGY  ED MD interpretation: 2 view CXR reviewed by me without evidence of acute  cardiopulmonary pathology.  Official radiology report(s): DG Chest 2 View  Result Date: 03/02/2020 CLINICAL DATA:  Dry cough for 10 days.  Short of breath. EXAM: CHEST - 2 VIEW COMPARISON:  11/11/2018 FINDINGS: Stable changes from prior cardiac surgery. Cardiac silhouette is normal in size. No mediastinal or hilar masses. No evidence of adenopathy. Clear lungs.  No pleural effusion or pneumothorax. Skeletal structures are intact. IMPRESSION: No acute cardiopulmonary disease. Electronically Signed   By: Lajean Manes M.D.   On: 03/02/2020 15:14   CT ABDOMEN PELVIS W CONTRAST  Result Date: 03/02/2020 CLINICAL DATA:  Abdominal pain with nausea and vomiting. Bowel obstruction suspected. History of bowel obstruction. Patient reports left hip pain. EXAM: CT ABDOMEN AND PELVIS WITH CONTRAST TECHNIQUE: Multidetector CT imaging of the abdomen and pelvis was performed using the standard protocol following bolus administration of intravenous contrast. CONTRAST:  46mL OMNIPAQUE IOHEXOL 300 MG/ML  SOLN COMPARISON:  Remote CT 11/09/2006 FINDINGS: Lower chest: Bilateral lower lobe bronchiectasis and atelectasis. Coronary artery calcifications. Mild cardiomegaly. Motion artifact limits detailed assessment. Hepatobiliary: Motion artifact through the liver. Punctate parenchymal calcifications typical of prior granulomatous disease. No evidence of focal lesion allowing for motion. Mild gallbladder distention without calcified gallstone or pericholecystic inflammation. There is no biliary dilatation. Pancreas: No ductal dilatation or inflammation. Spleen: Normal in size without focal abnormality. Adrenals/Urinary Tract: No adrenal nodule. Symmetric bilateral perinephric edema. Mild dilatation of the renal pelvis ease and ureters, with mild left ureteral thickening and enhancement. No frank hydronephrosis. 2.7 cm cyst in the mid left kidney. Additional cortical hypodensity in the lower left kidney is too small to characterize.  The urinary bladder is diffusely irregular, trabeculated, with irregular wall thickening. Area of low-density in the thickened right bladder dome is nonspecific. There is mild perivesicular fat stranding. Stomach/Bowel: Unremarkable stomach. Normal positioning of the duodenum and ligament of Treitz. There is no bowel obstruction, administered enteric contrast reaches the colon. No small bowel inflammation or wall thickening. High-riding cecum in the right mid abdomen. Normal terminal ileum. Appendix is not confidently visualized. No evidence of appendicitis. Moderate  volume of colonic stool with colonic tortuosity. Mild distal colonic diverticulosis. No diverticulitis. No colonic inflammation. Vascular/Lymphatic: Aortic and branch atherosclerosis. No acute vascular findings. Retroaortic left renal vein. Patent portal vein. No enlarged lymph nodes in the abdomen or pelvis. Reproductive: Possible prostatectomy. Other: Tiny fat containing umbilical hernia. No free air, free fluid, or intra-abdominal fluid collection. Musculoskeletal: Bones are diffusely under mineralized. Multilevel degenerative change throughout the spine. Fatty atrophy of the right ileus psoas and bilateral gluteal musculature, progressed from prior. No acute osseous abnormalities. IMPRESSION: 1. No bowel obstruction. Moderate volume of colonic stool with colonic tortuosity, suggesting constipation. Mild distal colonic diverticulosis without diverticulitis. 2. Diffusely irregular, trabeculated urinary bladder with irregular wall thickening and mild perivesicular fat stranding. Findings are suspicious for cystitis, patient has listed history of cystectomy, wall thickening may be in part chronic. Area of low-density in the thickened right bladder dome is nonspecific, but may represent focal infection. Mild left ureteral thickening and enhancement suspicious for ascending urinary tract infection. 3. Mild gallbladder distention without calcified  gallstone or pericholecystic inflammation. 4. Bilateral lower lobe bronchiectasis and atelectasis in the included lung bases. Aortic Atherosclerosis (ICD10-I70.0). Electronically Signed   By: Keith Rake M.D.   On: 03/02/2020 21:28    ____________________________________________   PROCEDURES and INTERVENTIONS  Procedure(s) performed (including Critical Care):  Procedures  Medications  lactated ringers infusion (has no administration in time range)  iohexol (OMNIPAQUE) 9 MG/ML oral solution 500 mL (500 mLs Oral Contrast Given 03/02/20 1855)  acetaminophen (TYLENOL) tablet 650 mg (650 mg Oral Given 03/02/20 1424)  lactated ringers bolus 1,000 mL (1,000 mLs Intravenous New Bag/Given 03/02/20 1957)  lactated ringers bolus 1,000 mL (1,000 mLs Intravenous New Bag/Given 03/02/20 1944)  cefTRIAXone (ROCEPHIN) 2 g in sodium chloride 0.9 % 100 mL IVPB (0 g Intravenous Stopped 03/02/20 1939)  metroNIDAZOLE (FLAGYL) IVPB 500 mg (500 mg Intravenous New Bag/Given 03/02/20 1955)  iohexol (OMNIPAQUE) 300 MG/ML solution 75 mL (75 mLs Intravenous Contrast Given 03/02/20 2049)    ____________________________________________   MDM / ED COURSE   Bedbound 87 year old man presents to the ED with fever and emesis, most attributable to pyelonephritis with evidence of sepsis, requiring medical admission. Patient febrile and tachycardic in triage, resolving after IVF antipyretics, vitals otherwise normal on room air. Exam with lower abdominal tenderness to palpation and bilateral CVA tenderness to palpation without peritoneal features. No respiratory distress and patient is conversational full sentences. Blood work with leukocytosis, but no lactic acidosis. Urine with some infectious features. Considering his tenderness and constipation, CT imaging performed and does not demonstrate SBO, but does demonstrate evidence of UTI and pyelonephritis. We will send his urine for culture, treat with Rocephin and admit to hospitalist  medicine for further work-up and management.   Clinical Course as of 03/02/20 2146  Fri Mar 02, 2020  2050 Patient headed to CT now. [DS]  2109 CT abdomen/pelvis reviewed by me with bladder wall thickening and perinephric stranding consistent pyelonephritis.  No SBO. [DS]    Clinical Course User Index [DS] Vladimir Crofts, MD    ____________________________________________   FINAL CLINICAL IMPRESSION(S) / ED DIAGNOSES  Final diagnoses:  Pyelonephritis  Sepsis without acute organ dysfunction, due to unspecified organism [facility])     ED Discharge Orders    None       Emilyrose Darrah   Note:  This document was prepared using Dragon voice recognition software and may include unintentional dictation errors.   Vladimir Crofts, MD 03/02/20 [phone]

## 2020-03-02 NOTE — H&P (Incomplete)
History and Physical    [name]. UOH:[id number] DOB: 03/06/1930 DOA: 03/02/2020  PCP: Dettinger, Fransisca Kaufmann, MD   Patient coming from: [facility]  I have personally briefly reviewed patient's old medical records in [place]  Chief Complaint: Shortness of breath and fever  HPI: [name]. is a 87 y.o. male with medical history significant for spina bifida, right AKA and wheelchair-bound, DM, CKD 3A, CAD, who presents to the emergency room with a complaint of fever and shortness of breath for the past 2 weeks, tested negative for COVID a couple times.  Of late he is started vomiting, nonbloody nonbilious and has been constipated. ED Course: On arrival he was febrile at 100.4, tachycardic at 106 with BP soft at 116/56 and tachypneic at 24 with O2 sat 96% on room air.  Blood work revealed WBC of 20,000 with hemoglobin 11.7.  CMP showed creatinine of 1.58 which is his baseline for his CKD.  Urinalysis showed pyuria.  Lactic acid 1.2. EKG as reviewed by me : Normal sinus rhythm at 88 with no acute ST-T wave changes Imaging: Chest x-ray showed clear lungs.  CT abdomen and pelvic showed no bowel obstruction but showed findings suspicious for cystitis, possible ascending UTI  Review of Systems: As per HPI otherwise all other systems on review of systems negative. ***   Past Medical History:  Diagnosis Date  . Agent orange exposure 1966 or 1971  . Anemia   . Anxiety   . Arthritis    "hands and back" (01/20/2013)  . CAD (coronary artery disease)    native vessel  . Carotid artery disease (HCC)    nonobstructive  . Cataract   . Cecal diverticulitis 2008   drained  . Cellulitis, gluteal    bilateral for the past 6 months/notes 01/20/2013  . Chronic lower back pain   . Chronic renal insufficiency   . Depression   . Diabetes mellitus type II   . Exertional shortness of breath   . HTN (hypertension)   . Hyperlipidemia   . Hypokalemia   . Malnutrition ([place])     protein-calorie  . Myocardial infarction [facility])    "silent; before OHS" (01/20/2013)  . PTSD (post-traumatic stress disorder)   . Spina bifida ([place])   . Urinary incontinence     Past Surgical History:  Procedure Laterality Date  . APPLICATION OF WOUND VAC Right 04/08/2019   Procedure: APPLICATION OF WOUND VAC;  Surgeon: Edrick Kins, DPM;  Location: [place];  Service: Podiatry;  Laterality: Right;  . BONE BIOPSY Right 04/08/2019   Procedure: BONE BIOPSY;  Surgeon: Edrick Kins, DPM;  Location: [place];  Service: Podiatry;  Laterality: Right;  . CARDIAC CATHETERIZATION  1998   "couple before my OHS" (01/20/2013)  . CARPAL TUNNEL RELEASE Right 1980's  . CATARACT EXTRACTION W/ INTRAOCULAR LENS  IMPLANT, BILATERAL Bilateral ~ 2010  . CORONARY ARTERY BYPASS GRAFT  1998   "CABG X4" (01/20/2013)  . CYSTECTOMY  2000's   "cytal cyst on my intestines; probed then drained it; hospitalized for 13 days; NPO" (01/20/2013)  . EYE SURGERY    . HEMORRHOID BANDING  ~ 10/2012  . IR FLUORO GUIDE CV LINE RIGHT  04/01/2019  . IR US GUIDE VASC ACCESS RIGHT  04/01/2019  . KNEE SURGERY Left 1964   "exploratory; sewed it up w/wire" (01/20/2013)  . LOWER EXTREMITY ANGIOGRAPHY Right 03/22/2019   Procedure: LOWER EXTREMITY ANGIOGRAPHY;  Surgeon:  Schnier, Dolores Lory, MD;  Location: [place] [facility];  Service: Cardiovascular;  Laterality: Right;  . LOWER EXTREMITY ANGIOGRAPHY Left 03/29/2019   Procedure: LOWER EXTREMITY ANGIOGRAPHY;  Surgeon: Katha Cabal, MD;  Location: [place] [facility];  Service: Cardiovascular;  Laterality: Left;  . PROSTATE SURGERY    . SPINE SURGERY  11/24/1932   "Spina bifida surgery"  . STRABISMUS SURGERY Bilateral 03/29/2015   Procedure: REPAIR STRABISMUS BILATERAL;  Surgeon: Lamonte Sakai, MD;  Location: [place];  Service: Ophthalmology;  Laterality: Bilateral;  . WOUND DEBRIDEMENT Right 04/08/2019   Procedure: DEBRIDEMENT  WOUND;  Surgeon: Edrick Kins, DPM;  Location: [place];  Service: Podiatry;  Laterality: Right;     reports that he quit smoking about 39 years ago. His smoking use included cigarettes. He started smoking about 71 years ago. He has a 40.00 pack-year smoking history. He has quit using smokeless tobacco.  His smokeless tobacco use included chew. He reports that he does not drink alcohol and does not use drugs.  Allergies  Allergen Reactions  . Sulfonamide Derivatives     REACTION: pruitis patient cant remember its been so long    . Aspirin Rash    In high doses  . Nitrofurantoin Rash  . Penicillins Rash    Did it involve swelling of the face/tongue/throat, SOB, or low BP? No Did it involve sudden or severe rash/hives, skin peeling, or any reaction on the inside of your mouth or nose? No Did you need to seek medical attention at a hospital or doctor's office? No When did it last happen?Teenager If all above answers are "NO", may proceed with cephalosporin use.  . Sulfa Antibiotics Rash    Family History  Problem Relation Age of Onset  . Heart disease Mother   . Heart attack Mother   . Heart disease Father   . Heart attack Father   . Cancer Sister        breast  . Cancer Sister        lung and ovarian   ***   Prior to Admission medications   Medication Sig Start Date End Date Taking? Authorizing Provider  aspirin 81 MG tablet Take 81 mg by mouth daily.      [provider]  buPROPion (WELLBUTRIN XL) 300 MG 24 hr tablet Take 1 tablet (300 mg total) by mouth daily. 10/20/18   Dettinger, Fransisca Kaufmann, MD  Cadexomer Iodine (IODOFLEX) 0.9 % PADS Apply 1 application topically See admin instructions. Twice a week heel    [provider]  clopidogrel (PLAVIX) 75 MG tablet Take 1 tablet (75 mg total) by mouth daily. 03/23/19   Schnier, Dolores Lory, MD  Coenzyme Q10 (CO Q-10) 100 MG CAPS Take 1 capsule by mouth 2 (two) times daily.     [provider]  dalbavancin (DALVANCE) 500 MG SOLR Inject 500 mg into the vein as directed. 04/20/19   Kuppelweiser, Cassie L, RPH-CPP  ezetimibe-simvastatin (VYTORIN) 10-20 MG tablet Take 1 tablet by mouth daily. 10/20/18   Dettinger, Fransisca Kaufmann, MD  glipiZIDE (GLUCOTROL) 10 MG tablet Take 10 mg by mouth daily.    [provider]  guaiFENesin (MUCINEX) 600 MG 12 hr tablet Take 1 tablet (600 mg total) by mouth 2 (two) times daily. 11/02/18   Loman Brooklyn, FNP  metoprolol tartrate (LOPRESSOR) 25 MG tablet Take 0.5 tablets (12.5 mg total) by mouth 2 (two) times daily. 12/15/18   Dettinger, Fransisca Kaufmann,  MD  Multiple Vitamins-Minerals (PRESERVISION AREDS 2 PO) Take 1 capsule by mouth 2 (two) times daily.    [provider]  nitroGLYCERIN (NITROSTAT) 0.4 MG SL tablet Place 1 tablet (0.4 mg total) under the tongue every 5 (five) minutes as needed. 07/06/13   Chipper Herb, MD  NON FORMULARY Take 1 tablet by mouth daily as needed (Pain). cran-actin 100mg  1 tablet daily    [provider]  nystatin cream (MYCOSTATIN) Apply 1 application topically daily as needed (foot sore).    [provider]  oxyCODONE-acetaminophen (PERCOCET) 5-325 MG tablet Take 1 tablet by mouth every 6 (six) hours as needed for severe pain. 04/08/19   Edrick Kins, DPM  polyethylene glycol powder (GLYCOLAX/MIRALAX) powder Take 17 g by mouth 2 (two) times daily as needed. 12/13/15   Chipper Herb, MD  sitaGLIPtin-metformin (JANUMET) 50-500 MG tablet Take 1 tablet by mouth daily. with food 10/20/18   Dettinger, Fransisca Kaufmann, MD  sodium chloride (OCEAN) 0.65 % SOLN nasal spray Place 1 spray into both nostrils as needed for congestion. 12/07/18   Dettinger, Fransisca Kaufmann, MD    Physical Exam: Vitals:   03/02/20 1930 03/02/20 1945 03/02/20 1955 03/02/20 2000  BP: 116/67 122/64  112/60  Pulse: 81 84  80  Resp: (!) 24   (!) 28  Temp:      TempSrc:      SpO2:   94% 93%     Vitals:   03/02/20 1930 03/02/20  1945 03/02/20 1955 03/02/20 2000  BP: 116/67 122/64  112/60  Pulse: 81 84  80  Resp: (!) 24   (!) 28  Temp:      TempSrc:      SpO2:   94% 93%      Constitutional: Alert*** and oriented x 3*** . Not in any apparent distress*** HEENT:      Head: Normocephalic and atraumatic.         Eyes: PERLA, EOMI, Conjunctivae are normal. Sclera is non-icteric.       Mouth/Throat: Mucous membranes are moist.       Neck: Supple with no signs of meningismus. Cardiovascular: Regular rate and rhythm***. No murmurs, gallops, or rubs. 2+ symmetrical distal pulses are present . No JVD. No ***LE edema Respiratory: Respiratory effort normal*** .Lungs sounds clear*** bilaterally. No*** wheezes, crackles, or rhonchi.  Gastrointestinal: Soft, non tender***, and non distended with positive bowel sounds.  Genitourinary: No CVA tenderness. Musculoskeletal: Nontender with normal range of motion in all extremities***. No cyanosis, or erythema of extremities. Neurologic:  Face is symmetric. Moving all extremities. No gross focal neurologic deficits ***. Skin: Skin is warm, dry.  No rash or ulcers*** Psychiatric: Mood and affect are normal***    Labs on Admission: I have personally reviewed following labs and imaging studies  CBC: Recent Labs  Lab 03/02/20 1414  WBC 19.8*  NEUTROABS 16.8*  HGB 11.7*  HCT 36.5*  MCV 91.5  PLT 169   Basic Metabolic Panel: Recent Labs  Lab 03/02/20 1414  NA 134*  K 4.7  CL 100  CO2 22  GLUCOSE 242*  BUN 34*  CREATININE 1.58*  CALCIUM 8.9   GFR: CrCl cannot be calculated (Unknown ideal weight.). Liver Function Tests: Recent Labs  Lab 03/02/20 1414  AST 14*  ALT 10  ALKPHOS 76  BILITOT 0.9  PROT 6.9  ALBUMIN 3.5   No results for input(s): LIPASE, AMYLASE in the last 168 hours. No results for input(s): AMMONIA in the last  168 hours. Coagulation Profile: No results for input(s): INR, PROTIME in the last 168 hours. Cardiac Enzymes: No results for  input(s): CKTOTAL, CKMB, CKMBINDEX, TROPONINI in the last 168 hours. BNP (last 3 results) No results for input(s): PROBNP in the last 8760 hours. HbA1C: No results for input(s): HGBA1C in the last 72 hours. CBG: No results for input(s): GLUCAP in the last 168 hours. Lipid Profile: No results for input(s): CHOL, HDL, LDLCALC, TRIG, CHOLHDL, LDLDIRECT in the last 72 hours. Thyroid Function Tests: No results for input(s): TSH, T4TOTAL, FREET4, T3FREE, THYROIDAB in the last 72 hours. Anemia Panel: No results for input(s): VITAMINB12, FOLATE, FERRITIN, TIBC, IRON, RETICCTPCT in the last 72 hours. Urine analysis:    Component Value Date/Time   COLORURINE YELLOW (A) 03/02/2020 1916   APPEARANCEUR HAZY (A) 03/02/2020 1916   APPEARANCEUR Clear 03/31/2018 1134   LABSPEC 1.011 03/02/2020 1916   PHURINE 5.0 03/02/2020 1916   GLUCOSEU NEGATIVE 03/02/2020 1916   HGBUR MODERATE (A) 03/02/2020 1916   BILIRUBINUR NEGATIVE 03/02/2020 1916   BILIRUBINUR Negative 03/31/2018 [place] 03/02/2020 1916   PROTEINUR 100 (A) 03/02/2020 1916   UROBILINOGEN negative 11/16/2014 1132   UROBILINOGEN 0.2 01/19/2013 1547   NITRITE NEGATIVE 03/02/2020 1916   LEUKOCYTESUR LARGE (A) 03/02/2020 1916    Radiological Exams on Admission: DG Chest 2 View  Result Date: 03/02/2020 CLINICAL DATA:  Dry cough for 10 days.  Short of breath. EXAM: CHEST - 2 VIEW COMPARISON:  11/11/2018 FINDINGS: Stable changes from prior cardiac surgery. Cardiac silhouette is normal in size. No mediastinal or hilar masses. No evidence of adenopathy. Clear lungs.  No pleural effusion or pneumothorax. Skeletal structures are intact. IMPRESSION: No acute cardiopulmonary disease. Electronically Signed   By: Lajean Manes M.D.   On: 03/02/2020 15:14   CT ABDOMEN PELVIS W CONTRAST  Result Date: 03/02/2020 CLINICAL DATA:  Abdominal pain with nausea and vomiting. Bowel obstruction suspected. History of bowel obstruction. Patient  reports left hip pain. EXAM: CT ABDOMEN AND PELVIS WITH CONTRAST TECHNIQUE: Multidetector CT imaging of the abdomen and pelvis was performed using the standard protocol following bolus administration of intravenous contrast. CONTRAST:  21mL OMNIPAQUE IOHEXOL 300 MG/ML  SOLN COMPARISON:  Remote CT 11/09/2006 FINDINGS: Lower chest: Bilateral lower lobe bronchiectasis and atelectasis. Coronary artery calcifications. Mild cardiomegaly. Motion artifact limits detailed assessment. Hepatobiliary: Motion artifact through the liver. Punctate parenchymal calcifications typical of prior granulomatous disease. No evidence of focal lesion allowing for motion. Mild gallbladder distention without calcified gallstone or pericholecystic inflammation. There is no biliary dilatation. Pancreas: No ductal dilatation or inflammation. Spleen: Normal in size without focal abnormality. Adrenals/Urinary Tract: No adrenal nodule. Symmetric bilateral perinephric edema. Mild dilatation of the renal pelvis ease and ureters, with mild left ureteral thickening and enhancement. No frank hydronephrosis. 2.7 cm cyst in the mid left kidney. Additional cortical hypodensity in the lower left kidney is too small to characterize. The urinary bladder is diffusely irregular, trabeculated, with irregular wall thickening. Area of low-density in the thickened right bladder dome is nonspecific. There is mild perivesicular fat stranding. Stomach/Bowel: Unremarkable stomach. Normal positioning of the duodenum and ligament of Treitz. There is no bowel obstruction, administered enteric contrast reaches the colon. No small bowel inflammation or wall thickening. High-riding cecum in the right mid abdomen. Normal terminal ileum. Appendix is not confidently visualized. No evidence of appendicitis. Moderate volume of colonic stool with colonic tortuosity. Mild distal colonic diverticulosis. No diverticulitis. No colonic inflammation. Vascular/Lymphatic: Aortic and  branch  atherosclerosis. No acute vascular findings. Retroaortic left renal vein. Patent portal vein. No enlarged lymph nodes in the abdomen or pelvis. Reproductive: Possible prostatectomy. Other: Tiny fat containing umbilical hernia. No free air, free fluid, or intra-abdominal fluid collection. Musculoskeletal: Bones are diffusely under mineralized. Multilevel degenerative change throughout the spine. Fatty atrophy of the right ileus psoas and bilateral gluteal musculature, progressed from prior. No acute osseous abnormalities. IMPRESSION: 1. No bowel obstruction. Moderate volume of colonic stool with colonic tortuosity, suggesting constipation. Mild distal colonic diverticulosis without diverticulitis. 2. Diffusely irregular, trabeculated urinary bladder with irregular wall thickening and mild perivesicular fat stranding. Findings are suspicious for cystitis, patient has listed history of cystectomy, wall thickening may be in part chronic. Area of low-density in the thickened right bladder dome is nonspecific, but may represent focal infection. Mild left ureteral thickening and enhancement suspicious for ascending urinary tract infection. 3. Mild gallbladder distention without calcified gallstone or pericholecystic inflammation. 4. Bilateral lower lobe bronchiectasis and atelectasis in the included lung bases. Aortic Atherosclerosis (ICD10-I70.0). Electronically Signed   By: Keith Rake M.D.   On: 03/02/2020 21:28     Assessment/Plan 87 year old male with history of spina bifida, right AKA  DM, CKD 3A, CAD, who presents to the emergency room with a complaint of fever and shortness of breath for the past 2 weeks, tested negative for COVID a couple times.  Of late he is started vomiting, nonbloody nonbilious and has been constipated.  COVID-negative, CT abdomen suspicious for ascending UTI    Acute pyelonephritis   Sepsis ([place]) - Sepsis criteria, fever, tachycardia and tachypnea, hypotension,  leukocytosis of 20,000 with normal lactic acid.  Abnormal urinalysis with CT abdomen and pelvis showing ascending UTI - Sepsis fluids - IV hydration - Follow cultures    Type 2 diabetes mellitus with diabetic nephropathy and PVD (HCC) - Sliding scale insulin coverage    Essential hypertension - Hold home antihypertensives due to borderline blood pressure on arrival    CAD (coronary artery disease) - Stable.  No complaints of chest pain, EKG nonacute - Continue Plavix and aspirin, metoprolol, Vytorin and sublingual nitroglycerin    Spina bifida ([place])   S/P AKA (above knee amputation), right (HCC) - Increased assistance with transfers  PVD (peripheral vascular disease) ([place]) - On Plavix and aspirin    Chronic kidney disease, stage 3a ([place]) - Renal function at baseline.    DVT prophylaxis: Lovenox  Code Status: full code  Family Communication:  none  Disposition Plan: Back to previous home environment Consults called: none  Status:At the time of admission, it appears that the appropriate admission status for this patient is INPATIENT. This is judged to be reasonable and necessary in order to provide the required intensity of service to ensure the patient's safety given the presenting symptoms, physical exam findings, and initial radiographic and laboratory data in the context of their  Comorbid conditions.   Patient requires inpatient status due to high intensity of service, high risk for further deterioration and high frequency of surveillance required.   I certify that at the point of admission it is my clinical judgment that the patient will require inpatient hospital care spanning beyond [place] MD [facility]     03/02/2020, 10:18 PM

## 2020-03-02 NOTE — H&P (Signed)
History and Physical    [name]. LPF:[id number] DOB: 07/30/1930 DOA: 03/02/2020  PCP: Dettinger, Fransisca Kaufmann, MD   Patient coming from: [facility]  I have personally briefly reviewed patient's old medical records in [place]  Chief Complaint: Shortness of breath and fever  HPI: [name]. is a 87 y.o. male with medical history significant for spina bifida, right AKA and wheelchair-bound, DM, CKD 3A, CAD, who presents to the emergency room with a complaint of fever and shortness of breath for the past 2 weeks, tested negative for COVID a couple times.  Of late he is started vomiting, nonbloody nonbilious and has been constipated. ED Course: On arrival he was febrile at 100.4, tachycardic at 106 with BP soft at 116/56 and tachypneic at 24 with O2 sat 96% on room air.  Blood work revealed WBC of 20,000 with hemoglobin 11.7.  CMP showed creatinine of 1.58 which is his baseline for his CKD.  Urinalysis showed pyuria.  Lactic acid 1.2. EKG as reviewed by me : Normal sinus rhythm at 88 with no acute ST-T wave changes Imaging: Chest x-ray showed clear lungs.  CT abdomen and pelvic showed no bowel obstruction but showed findings suspicious for cystitis, possible ascending UTI  Review of Systems: As per HPI otherwise all other systems on review of systems negative.    Past Medical History:  Diagnosis Date  . Agent orange exposure 1966 or 1971  . Anemia   . Anxiety   . Arthritis    "hands and back" (01/20/2013)  . CAD (coronary artery disease)    native vessel  . Carotid artery disease (HCC)    nonobstructive  . Cataract   . Cecal diverticulitis 2008   drained  . Cellulitis, gluteal    bilateral for the past 6 months/notes 01/20/2013  . Chronic lower back pain   . Chronic renal insufficiency   . Depression   . Diabetes mellitus type II   . Exertional shortness of breath   . HTN (hypertension)   . Hyperlipidemia   . Hypokalemia   . Malnutrition ([place])     protein-calorie  . Myocardial infarction [facility])    "silent; before OHS" (01/20/2013)  . PTSD (post-traumatic stress disorder)   . Spina bifida ([place])   . Urinary incontinence     Past Surgical History:  Procedure Laterality Date  . APPLICATION OF WOUND VAC Right 04/08/2019   Procedure: APPLICATION OF WOUND VAC;  Surgeon: Edrick Kins, DPM;  Location: [place];  Service: Podiatry;  Laterality: Right;  . BONE BIOPSY Right 04/08/2019   Procedure: BONE BIOPSY;  Surgeon: Edrick Kins, DPM;  Location: [place];  Service: Podiatry;  Laterality: Right;  . CARDIAC CATHETERIZATION  1998   "couple before my OHS" (01/20/2013)  . CARPAL TUNNEL RELEASE Right 1980's  . CATARACT EXTRACTION W/ INTRAOCULAR LENS  IMPLANT, BILATERAL Bilateral ~ 2010  . CORONARY ARTERY BYPASS GRAFT  1998   "CABG X4" (01/20/2013)  . CYSTECTOMY  2000's   "cytal cyst on my intestines; probed then drained it; hospitalized for 13 days; NPO" (01/20/2013)  . EYE SURGERY    . HEMORRHOID BANDING  ~ 10/2012  . IR FLUORO GUIDE CV LINE RIGHT  04/01/2019  . IR US GUIDE VASC ACCESS RIGHT  04/01/2019  . KNEE SURGERY Left 1964   "exploratory; sewed it up w/wire" (01/20/2013)  . LOWER EXTREMITY ANGIOGRAPHY Right 03/22/2019   Procedure: LOWER EXTREMITY ANGIOGRAPHY;  Surgeon:  Schnier, Dolores Lory, MD;  Location: [place] [facility];  Service: Cardiovascular;  Laterality: Right;  . LOWER EXTREMITY ANGIOGRAPHY Left 03/29/2019   Procedure: LOWER EXTREMITY ANGIOGRAPHY;  Surgeon: Katha Cabal, MD;  Location: [place] [facility];  Service: Cardiovascular;  Laterality: Left;  . PROSTATE SURGERY    . SPINE SURGERY  11/24/1932   "Spina bifida surgery"  . STRABISMUS SURGERY Bilateral 03/29/2015   Procedure: REPAIR STRABISMUS BILATERAL;  Surgeon: Lamonte Sakai, MD;  Location: [place];  Service: Ophthalmology;  Laterality: Bilateral;  . WOUND DEBRIDEMENT Right 04/08/2019   Procedure: DEBRIDEMENT WOUND;   Surgeon: Edrick Kins, DPM;  Location: [place];  Service: Podiatry;  Laterality: Right;     reports that he quit smoking about 39 years ago. His smoking use included cigarettes. He started smoking about 71 years ago. He has a 40.00 pack-year smoking history. He has quit using smokeless tobacco.  His smokeless tobacco use included chew. He reports that he does not drink alcohol and does not use drugs.  Allergies  Allergen Reactions  . Sulfonamide Derivatives     REACTION: pruitis patient cant remember its been so long    . Aspirin Rash    In high doses  . Nitrofurantoin Rash  . Penicillins Rash    Did it involve swelling of the face/tongue/throat, SOB, or low BP? No Did it involve sudden or severe rash/hives, skin peeling, or any reaction on the inside of your mouth or nose? No Did you need to seek medical attention at a hospital or doctor's office? No When did it last happen?Teenager If all above answers are "NO", may proceed with cephalosporin use.  . Sulfa Antibiotics Rash    Family History  Problem Relation Age of Onset  . Heart disease Mother   . Heart attack Mother   . Heart disease Father   . Heart attack Father   . Cancer Sister        breast  . Cancer Sister        lung and ovarian     Prior to Admission medications   Medication Sig Start Date End Date Taking? Authorizing Provider  aspirin 81 MG tablet Take 81 mg by mouth daily.      [provider]  buPROPion (WELLBUTRIN XL) 300 MG 24 hr tablet Take 1 tablet (300 mg total) by mouth daily. 10/20/18   Dettinger, Fransisca Kaufmann, MD  Cadexomer Iodine (IODOFLEX) 0.9 % PADS Apply 1 application topically See admin instructions. Twice a week heel    [provider]  clopidogrel (PLAVIX) 75 MG tablet Take 1 tablet (75 mg total) by mouth daily. 03/23/19   Schnier, Dolores Lory, MD  Coenzyme Q10 (CO Q-10) 100 MG CAPS Take 1 capsule by mouth 2 (two) times daily.     [provider]   dalbavancin (DALVANCE) 500 MG SOLR Inject 500 mg into the vein as directed. 04/20/19   Kuppelweiser, Cassie L, RPH-CPP  ezetimibe-simvastatin (VYTORIN) 10-20 MG tablet Take 1 tablet by mouth daily. 10/20/18   Dettinger, Fransisca Kaufmann, MD  glipiZIDE (GLUCOTROL) 10 MG tablet Take 10 mg by mouth daily.    [provider]  guaiFENesin (MUCINEX) 600 MG 12 hr tablet Take 1 tablet (600 mg total) by mouth 2 (two) times daily. 11/02/18   Loman Brooklyn, FNP  metoprolol tartrate (LOPRESSOR) 25 MG tablet Take 0.5 tablets (12.5 mg total) by mouth 2 (two) times daily. 12/15/18   Dettinger, Fransisca Kaufmann, MD  Multiple Vitamins-Minerals (PRESERVISION AREDS 2 PO) Take 1 capsule by mouth 2 (two) times daily.    [provider]  nitroGLYCERIN (NITROSTAT) 0.4 MG SL tablet Place 1 tablet (0.4 mg total) under the tongue every 5 (five) minutes as needed. 07/06/13   Chipper Herb, MD  NON FORMULARY Take 1 tablet by mouth daily as needed (Pain). cran-actin 100mg  1 tablet daily    [provider]  nystatin cream (MYCOSTATIN) Apply 1 application topically daily as needed (foot sore).    [provider]  oxyCODONE-acetaminophen (PERCOCET) 5-325 MG tablet Take 1 tablet by mouth every 6 (six) hours as needed for severe pain. 04/08/19   Edrick Kins, DPM  polyethylene glycol powder (GLYCOLAX/MIRALAX) powder Take 17 g by mouth 2 (two) times daily as needed. 12/13/15   Chipper Herb, MD  sitaGLIPtin-metformin (JANUMET) 50-500 MG tablet Take 1 tablet by mouth daily. with food 10/20/18   Dettinger, Fransisca Kaufmann, MD  sodium chloride (OCEAN) 0.65 % SOLN nasal spray Place 1 spray into both nostrils as needed for congestion. 12/07/18   Dettinger, Fransisca Kaufmann, MD    Physical Exam: Vitals:   03/02/20 1930 03/02/20 1945 03/02/20 1955 03/02/20 2000  BP: 116/67 122/64  112/60  Pulse: 81 84  80  Resp: (!) 24   (!) 28  Temp:      TempSrc:      SpO2:   94% 93%     Vitals:   03/02/20 1930 03/02/20 1945 03/02/20  1955 03/02/20 2000  BP: 116/67 122/64  112/60  Pulse: 81 84  80  Resp: (!) 24   (!) 28  Temp:      TempSrc:      SpO2:   94% 93%      Constitutional: Alert and oriented x 3 . Not in any apparent distress HEENT:      Head: Normocephalic and atraumatic.         Eyes: PERLA, EOMI, Conjunctivae are normal. Sclera is non-icteric.       Mouth/Throat: Mucous membranes are moist.       Neck: Supple with no signs of meningismus. Cardiovascular: Regular rate and rhythm. No murmurs, gallops, or rubs. 2+ symmetrical distal pulses are present . No JVD. No LE edema Respiratory: Respiratory effort normal .Lungs sounds clear bilaterally. No wheezes, crackles, or rhonchi.  Gastrointestinal:  Diffuse tenderness and non distended with positive bowel sounds.  Genitourinary: No CVA tenderness. Musculoskeletal:  Right AKA nontender with normal range of motion in all extremities. No cyanosis, or erythema of extremities. Neurologic:  Face is symmetric. Moving all extremities.right AKA skin:  Wounds left foot left forearm Psychiatric: Mood and affect are normal    Labs on Admission: I have personally reviewed following labs and imaging studies  CBC: Recent Labs  Lab 03/02/20 1414  WBC 19.8*  NEUTROABS 16.8*  HGB 11.7*  HCT 36.5*  MCV 91.5  PLT 676   Basic Metabolic Panel: Recent Labs  Lab 03/02/20 1414  NA 134*  K 4.7  CL 100  CO2 22  GLUCOSE 242*  BUN 34*  CREATININE 1.58*  CALCIUM 8.9   GFR: CrCl cannot be calculated (Unknown ideal weight.). Liver Function Tests: Recent Labs  Lab 03/02/20 1414  AST 14*  ALT 10  ALKPHOS 76  BILITOT 0.9  PROT 6.9  ALBUMIN 3.5   No results for input(s): LIPASE, AMYLASE in the last 168 hours. No results for input(s): AMMONIA in the last 168 hours. Coagulation Profile: No results for  input(s): INR, PROTIME in the last 168 hours. Cardiac Enzymes: No results for input(s): CKTOTAL, CKMB, CKMBINDEX, TROPONINI in the last 168 hours. BNP (last 3  results) No results for input(s): PROBNP in the last 8760 hours. HbA1C: No results for input(s): HGBA1C in the last 72 hours. CBG: No results for input(s): GLUCAP in the last 168 hours. Lipid Profile: No results for input(s): CHOL, HDL, LDLCALC, TRIG, CHOLHDL, LDLDIRECT in the last 72 hours. Thyroid Function Tests: No results for input(s): TSH, T4TOTAL, FREET4, T3FREE, THYROIDAB in the last 72 hours. Anemia Panel: No results for input(s): VITAMINB12, FOLATE, FERRITIN, TIBC, IRON, RETICCTPCT in the last 72 hours. Urine analysis:    Component Value Date/Time   COLORURINE YELLOW (A) 03/02/2020 1916   APPEARANCEUR HAZY (A) 03/02/2020 1916   APPEARANCEUR Clear 03/31/2018 1134   LABSPEC 1.011 03/02/2020 1916   PHURINE 5.0 03/02/2020 1916   GLUCOSEU NEGATIVE 03/02/2020 1916   HGBUR MODERATE (A) 03/02/2020 1916   BILIRUBINUR NEGATIVE 03/02/2020 1916   BILIRUBINUR Negative 03/31/2018 [place] 03/02/2020 1916   PROTEINUR 100 (A) 03/02/2020 1916   UROBILINOGEN negative 11/16/2014 1132   UROBILINOGEN 0.2 01/19/2013 1547   NITRITE NEGATIVE 03/02/2020 1916   LEUKOCYTESUR LARGE (A) 03/02/2020 1916    Radiological Exams on Admission: DG Chest 2 View  Result Date: 03/02/2020 CLINICAL DATA:  Dry cough for 10 days.  Short of breath. EXAM: CHEST - 2 VIEW COMPARISON:  11/11/2018 FINDINGS: Stable changes from prior cardiac surgery. Cardiac silhouette is normal in size. No mediastinal or hilar masses. No evidence of adenopathy. Clear lungs.  No pleural effusion or pneumothorax. Skeletal structures are intact. IMPRESSION: No acute cardiopulmonary disease. Electronically Signed   By: Lajean Manes M.D.   On: 03/02/2020 15:14   CT ABDOMEN PELVIS W CONTRAST  Result Date: 03/02/2020 CLINICAL DATA:  Abdominal pain with nausea and vomiting. Bowel obstruction suspected. History of bowel obstruction. Patient reports left hip pain. EXAM: CT ABDOMEN AND PELVIS WITH CONTRAST TECHNIQUE:  Multidetector CT imaging of the abdomen and pelvis was performed using the standard protocol following bolus administration of intravenous contrast. CONTRAST:  61mL OMNIPAQUE IOHEXOL 300 MG/ML  SOLN COMPARISON:  Remote CT 11/09/2006 FINDINGS: Lower chest: Bilateral lower lobe bronchiectasis and atelectasis. Coronary artery calcifications. Mild cardiomegaly. Motion artifact limits detailed assessment. Hepatobiliary: Motion artifact through the liver. Punctate parenchymal calcifications typical of prior granulomatous disease. No evidence of focal lesion allowing for motion. Mild gallbladder distention without calcified gallstone or pericholecystic inflammation. There is no biliary dilatation. Pancreas: No ductal dilatation or inflammation. Spleen: Normal in size without focal abnormality. Adrenals/Urinary Tract: No adrenal nodule. Symmetric bilateral perinephric edema. Mild dilatation of the renal pelvis ease and ureters, with mild left ureteral thickening and enhancement. No frank hydronephrosis. 2.7 cm cyst in the mid left kidney. Additional cortical hypodensity in the lower left kidney is too small to characterize. The urinary bladder is diffusely irregular, trabeculated, with irregular wall thickening. Area of low-density in the thickened right bladder dome is nonspecific. There is mild perivesicular fat stranding. Stomach/Bowel: Unremarkable stomach. Normal positioning of the duodenum and ligament of Treitz. There is no bowel obstruction, administered enteric contrast reaches the colon. No small bowel inflammation or wall thickening. High-riding cecum in the right mid abdomen. Normal terminal ileum. Appendix is not confidently visualized. No evidence of appendicitis. Moderate volume of colonic stool with colonic tortuosity. Mild distal colonic diverticulosis. No diverticulitis. No colonic inflammation. Vascular/Lymphatic: Aortic and branch atherosclerosis. No acute vascular findings. Retroaortic left renal  vein.  Patent portal vein. No enlarged lymph nodes in the abdomen or pelvis. Reproductive: Possible prostatectomy. Other: Tiny fat containing umbilical hernia. No free air, free fluid, or intra-abdominal fluid collection. Musculoskeletal: Bones are diffusely under mineralized. Multilevel degenerative change throughout the spine. Fatty atrophy of the right ileus psoas and bilateral gluteal musculature, progressed from prior. No acute osseous abnormalities. IMPRESSION: 1. No bowel obstruction. Moderate volume of colonic stool with colonic tortuosity, suggesting constipation. Mild distal colonic diverticulosis without diverticulitis. 2. Diffusely irregular, trabeculated urinary bladder with irregular wall thickening and mild perivesicular fat stranding. Findings are suspicious for cystitis, patient has listed history of cystectomy, wall thickening may be in part chronic. Area of low-density in the thickened right bladder dome is nonspecific, but may represent focal infection. Mild left ureteral thickening and enhancement suspicious for ascending urinary tract infection. 3. Mild gallbladder distention without calcified gallstone or pericholecystic inflammation. 4. Bilateral lower lobe bronchiectasis and atelectasis in the included lung bases. Aortic Atherosclerosis (ICD10-I70.0). Electronically Signed   By: Keith Rake M.D.   On: 03/02/2020 21:28     Assessment/Plan 87 year old male with history of spina bifida, right AKA  DM, CKD 3A, CAD, who presents to the emergency room with a complaint of fever and shortness of breath for the past 2 weeks, tested negative for COVID a couple times.  Of late he is started vomiting, nonbloody nonbilious and has been constipated.  COVID-negative, CT abdomen suspicious for ascending UTI    Acute pyelonephritis   Sepsis ([place]) - Sepsis criteria, fever, tachycardia and tachypnea, hypotension, leukocytosis of 20,000 with normal lactic acid.  Abnormal urinalysis with CT abdomen and  pelvis showing ascending UTI - Sepsis fluids - IV hydration - Follow cultures    Type 2 diabetes mellitus with diabetic nephropathy and PVD (HCC) - Sliding scale insulin coverage    Essential hypertension - Hold home antihypertensives due to borderline blood pressure on arrival    CAD (coronary artery disease) - Stable.  No complaints of chest pain, EKG nonacute - Continue Plavix and aspirin, metoprolol, Vytorin and sublingual nitroglycerin    Spina bifida ([place])   S/P AKA (above knee amputation), right (HCC) - Increased assistance with transfers  PVD (peripheral vascular disease) ([place]) - On Plavix and aspirin    Chronic kidney disease, stage 3a ([place]) - Renal function at baseline.    DVT prophylaxis: Lovenox  Code Status: full code  Family Communication:  none  Disposition Plan: Back to previous home environment Consults called: none  Status:At the time of admission, it appears that the appropriate admission status for this patient is INPATIENT. This is judged to be reasonable and necessary in order to provide the required intensity of service to ensure the patient's safety given the presenting symptoms, physical exam findings, and initial radiographic and laboratory data in the context of their  Comorbid conditions.   Patient requires inpatient status due to high intensity of service, high risk for further deterioration and high frequency of surveillance required.   I certify that at the point of admission it is my clinical judgment that the patient will require inpatient hospital care spanning beyond [place] MD [facility]     03/02/2020, 10:18 PM

## 2020-03-02 NOTE — ED Triage Notes (Signed)
First Nurse Note:  Arrives via [facility] from [facility] for evaluation for possible pneumonia.  Per report, patient with 7-10 day illness, fever and productive cough for green sputum.  VS wnl  SpO2 95% on RA.  Has had multiple COVID tests at SNF, all negative.  Awake and alert.  No AOB/ DOE.  NAD

## 2020-03-02 NOTE — Consult Note (Signed)
CODE SEPSIS - PHARMACY COMMUNICATION  **Broad Spectrum Antibiotics should be administered within 1 hour of Sepsis diagnosis**  Time Code Sepsis Called/Page Received: 1835  Antibiotics Ordered: Rocephin 2gm, Flagyl 500mg  IV  Time of 1st antibiotic administration: Rocephin given @1907   Additional action taken by pharmacy: No further action indicated  If necessary, Name of Provider/Nurse Contacted: Pamala Hurry, RN    Berta Minor ,PharmD Clinical Pharmacist  03/02/2020  7:15 PM

## 2020-03-02 NOTE — Progress Notes (Deleted)
CODE SEPSIS - PHARMACY COMMUNICATION  **Broad Spectrum Antibiotics should be administered within 1 hour of Sepsis diagnosis**  Time Code Sepsis Called/Page Received:  1/7 @ 1836   Antibiotics Ordered: Ceftriaxone, metronidazole   Time of 1st antibiotic administration: Ceftriaxone 2 gm IV X 1 on 1/7 @ 1907  Additional action taken by pharmacy:   If necessary, Name of Provider/Nurse Contacted:     Hassel Uphoff D ,PharmD Clinical Pharmacist  03/02/2020  7:16 PM

## 2020-03-02 NOTE — ED Triage Notes (Signed)
Pt was tested neg for flu and covid. Had labs drawn earlier in the week per son.. started having N/V today, has a hx of bowel obstruction in the past.

## 2020-03-02 NOTE — Progress Notes (Signed)
Pt being followed by [facility] for Code Sepsis protocol.

## 2020-03-02 NOTE — ED Notes (Signed)
MD at bedside. 

## 2020-03-03 ENCOUNTER — Inpatient Hospital Stay: Payer: Medicare Other

## 2020-03-03 DIAGNOSIS — I739 Peripheral vascular disease, unspecified: Secondary | ICD-10-CM | POA: Diagnosis not present

## 2020-03-03 DIAGNOSIS — A419 Sepsis, unspecified organism: Principal | ICD-10-CM

## 2020-03-03 DIAGNOSIS — J189 Pneumonia, unspecified organism: Secondary | ICD-10-CM | POA: Diagnosis present

## 2020-03-03 DIAGNOSIS — I10 Essential (primary) hypertension: Secondary | ICD-10-CM

## 2020-03-03 DIAGNOSIS — N12 Tubulo-interstitial nephritis, not specified as acute or chronic: Secondary | ICD-10-CM | POA: Diagnosis not present

## 2020-03-03 DIAGNOSIS — N10 Acute tubulo-interstitial nephritis: Secondary | ICD-10-CM

## 2020-03-03 LAB — CBC
HCT: 32 % — ABNORMAL LOW (ref 39.0–52.0)
HCT: 29.6 % — ABNORMAL LOW (ref 39.0–52.0)
Hemoglobin: 10.2 g/dL — ABNORMAL LOW (ref 13.0–17.0)
Hemoglobin: 9.4 g/dL — ABNORMAL LOW (ref 13.0–17.0)
MCH: 29.6 pg (ref 26.0–34.0)
MCH: 29.3 pg (ref 26.0–34.0)
MCHC: 31.9 g/dL (ref 30.0–36.0)
MCHC: 31.8 g/dL (ref 30.0–36.0)
MCV: 92.8 fL (ref 80.0–100.0)
MCV: 92.2 fL (ref 80.0–100.0)
Platelets: 200 10*3/uL (ref 150–400)
Platelets: 178 10*3/uL (ref 150–400)
RBC: 3.45 MIL/uL — ABNORMAL LOW (ref 4.22–5.81)
RBC: 3.21 MIL/uL — ABNORMAL LOW (ref 4.22–5.81)
RDW: 14.1 % (ref 11.5–15.5)
RDW: 14.2 % (ref 11.5–15.5)
WBC: 16.7 10*3/uL — ABNORMAL HIGH (ref 4.0–10.5)
WBC: 14.4 10*3/uL — ABNORMAL HIGH (ref 4.0–10.5)
nRBC: 0 % (ref 0.0–0.2)
nRBC: 0 % (ref 0.0–0.2)

## 2020-03-03 LAB — BASIC METABOLIC PANEL
Anion gap: 7 (ref 5–15)
BUN: 37 mg/dL — ABNORMAL HIGH (ref 8–23)
CO2: 25 mmol/L (ref 22–32)
Calcium: 8.1 mg/dL — ABNORMAL LOW (ref 8.9–10.3)
Chloride: 103 mmol/L (ref 98–111)
Creatinine, Ser: 1.52 mg/dL — ABNORMAL HIGH (ref 0.61–1.24)
GFR, Estimated: 44 mL/min — ABNORMAL LOW (ref >60)
Glucose, Bld: 147 mg/dL — ABNORMAL HIGH (ref 70–99)
Potassium: 4.9 mmol/L (ref 3.5–5.1)
Sodium: 135 mmol/L (ref 135–145)

## 2020-03-03 LAB — BLOOD CULTURE ID PANEL (REFLEXED) - BCID2

## 2020-03-03 LAB — GLUCOSE, CAPILLARY
Glucose-Capillary: 104 mg/dL — ABNORMAL HIGH (ref 70–99)
Glucose-Capillary: 195 mg/dL — ABNORMAL HIGH (ref 70–99)
Glucose-Capillary: 123 mg/dL — ABNORMAL HIGH (ref 70–99)
Glucose-Capillary: 107 mg/dL — ABNORMAL HIGH (ref 70–99)

## 2020-03-03 LAB — CREATININE, SERUM
Creatinine, Ser: 1.54 mg/dL — ABNORMAL HIGH (ref 0.61–1.24)
GFR, Estimated: 43 mL/min — ABNORMAL LOW (ref >60)

## 2020-03-03 LAB — MRSA PCR SCREENING: MRSA by PCR: NEGATIVE

## 2020-03-03 LAB — PROTIME-INR
INR: 1.1 (ref 0.8–1.2)
Prothrombin Time: 14.1 seconds (ref 11.4–15.2)

## 2020-03-03 LAB — HEMOGLOBIN A1C
Hgb A1c MFr Bld: 6.5 % — ABNORMAL HIGH (ref 4.8–5.6)
Mean Plasma Glucose: 139.85 mg/dL

## 2020-03-03 LAB — CORTISOL-AM, BLOOD: Cortisol - AM: 11.8 ug/dL (ref 6.7–22.6)

## 2020-03-03 LAB — PROCALCITONIN: Procalcitonin: 1.02 ng/mL

## 2020-03-03 MED ORDER — SENNA 8.6 MG PO TABS
2.0000 | ORAL_TABLET | Freq: Two times a day (BID) | ORAL | Status: DC
  Administered 2020-03-03 – 2020-03-04 (×2): 17.2 mg via ORAL
  Filled 2020-03-03 (×3): qty 2

## 2020-03-03 MED ORDER — GLYCERIN-HYPROMELLOSE-PEG 400 0.2-0.2-1 % OP SOLN: 1.0000 [drp] | Freq: Three times a day (TID) | OPHTHALMIC | Status: DC

## 2020-03-03 MED ORDER — DOCUSATE SODIUM 100 MG PO CAPS
100.0000 mg | ORAL_CAPSULE | Freq: Every day | ORAL | Status: DC
  Administered 2020-03-03: 100 mg via ORAL
  Filled 2020-03-03 (×2): qty 1

## 2020-03-03 MED ORDER — METOPROLOL TARTRATE 25 MG PO TABS
12.5000 mg | ORAL_TABLET | Freq: Two times a day (BID) | ORAL | Status: DC
  Administered 2020-03-04 – 2020-03-06 (×5): 12.5 mg via ORAL
  Filled 2020-03-03 (×7): qty 1

## 2020-03-03 MED ORDER — SIMVASTATIN 20 MG PO TABS
20.0000 mg | ORAL_TABLET | Freq: Every day | ORAL | Status: DC
  Administered 2020-03-03 – 2020-03-05 (×3): 20 mg via ORAL
  Filled 2020-03-03 (×4): qty 1

## 2020-03-03 MED ORDER — EZETIMIBE 10 MG PO TABS
10.0000 mg | ORAL_TABLET | Freq: Every day | ORAL | Status: DC
  Administered 2020-03-03 – 2020-03-05 (×3): 10 mg via ORAL
  Filled 2020-03-03 (×4): qty 1

## 2020-03-03 MED ORDER — CLOPIDOGREL BISULFATE 75 MG PO TABS
75.0000 mg | ORAL_TABLET | Freq: Every day | ORAL | Status: DC
  Administered 2020-03-03 – 2020-03-06 (×4): 75 mg via ORAL
  Filled 2020-03-03 (×4): qty 1

## 2020-03-03 MED ORDER — SODIUM CHLORIDE 0.9 % IV SOLN
500.0000 mg | INTRAVENOUS | Status: DC
  Administered 2020-03-03 – 2020-03-05 (×3): 500 mg via INTRAVENOUS
  Filled 2020-03-03 (×4): qty 500

## 2020-03-03 MED ORDER — ASPIRIN 81 MG PO CHEW
81.0000 mg | CHEWABLE_TABLET | Freq: Every day | ORAL | Status: DC
Start: 2020-03-03 — End: 2020-03-06
  Administered 2020-03-03 – 2020-03-06 (×4): 81 mg via ORAL
  Filled 2020-03-03 (×4): qty 1

## 2020-03-03 MED ORDER — MELATONIN 5 MG PO TABS
5.0000 mg | ORAL_TABLET | Freq: Every day | ORAL | Status: DC
  Administered 2020-03-04 – 2020-03-05 (×2): 5 mg via ORAL
  Filled 2020-03-03 (×3): qty 1

## 2020-03-03 MED ORDER — NYSTATIN 100000 UNIT/GM EX POWD
Freq: Two times a day (BID) | CUTANEOUS | Status: DC
  Filled 2020-03-03: qty 15

## 2020-03-03 MED ORDER — BUPROPION HCL ER (XL) 150 MG PO TB24
150.0000 mg | ORAL_TABLET | Freq: Every day | ORAL | Status: DC
  Administered 2020-03-04 – 2020-03-06 (×3): 150 mg via ORAL
  Filled 2020-03-03 (×3): qty 1

## 2020-03-03 MED ORDER — PANTOPRAZOLE SODIUM 40 MG PO TBEC
40.0000 mg | DELAYED_RELEASE_TABLET | Freq: Every day | ORAL | Status: DC
  Administered 2020-03-04 – 2020-03-06 (×3): 40 mg via ORAL
  Filled 2020-03-03 (×3): qty 1

## 2020-03-03 MED ORDER — POLYVINYL ALCOHOL 1.4 % OP SOLN
1.0000 [drp] | OPHTHALMIC | Status: DC | PRN
  Filled 2020-03-03: qty 15

## 2020-03-03 MED ORDER — GUAIFENESIN-DM 100-10 MG/5ML PO SYRP
5.0000 mL | ORAL_SOLUTION | ORAL | Status: DC | PRN
  Administered 2020-03-03 (×2): 5 mL via ORAL
  Filled 2020-03-03 (×2): qty 5

## 2020-03-03 NOTE — Progress Notes (Signed)
[place] attempted to visit pt. twice this evening in response to OR for prayer; pt. initially having bedding changed and then asleep.  CH remains available as needed and will pass referral to AM chaplain.

## 2020-03-03 NOTE — TOC Progression Note (Signed)
Transition of Care (TOC) - Progression Note    Patient Details  Name: [name]. MRN: [id number] Date of Birth: [birth year]  Transition of Care [facility]) CM/SW Contact  Izola Price, RN Phone Number: 03/03/2020, 12:11 PM  Clinical Narrative:    03/03/20 new [facility] consult for SNF placement. ED notes indicate patient arrived via EMS from [facility]. Code Sepsis 03/02/20. TOC will continue to monitor for medical stability/discharge. Simmie Davies RN CM        Expected Discharge Plan and Services                                                 Social Determinants of Health (SDOH) Interventions    Readmission Risk Interventions No flowsheet data found.

## 2020-03-03 NOTE — Care Plan (Signed)
Patient has noted stage 2 almost 3 pressure sore to sacrum. Red, bleeding, excoriated. Half dollar size in 1 spot then dime size in another, present upon admission. Pt genitals at his scrotum is red and raw.   Areas cleansed with soap and secura applied. Foam dressing to sacrum, MD [place] made aware.

## 2020-03-03 NOTE — Progress Notes (Addendum)
.  rxbcid PHARMACY - PHYSICIAN COMMUNICATION CRITICAL VALUE ALERT - BLOOD CULTURE IDENTIFICATION (BCID)  Bobby Hazen. is an 87 y.o. male who presented to [facility] on 03/02/2020 with a chief complaint of sepsis  Assessment:  (include suspected source if known) Positive blood culture from suspected UTI source; Mech A&C noted  Name of physician (or Provider) Contacted: Jennye Boroughs  Current antibiotics: Rocephin 2gms IV daily  Changes to prescribed antibiotics recommended:  Patient is on recommended antibiotics - No changes needed     Results for orders placed or performed during the hospital encounter of 03/02/20  Blood Culture ID Panel (Reflexed) (Collected: 03/02/2020  7:16 PM)  Result Value Ref Range   Enterococcus faecalis NOT DETECTED NOT DETECTED   Enterococcus Faecium NOT DETECTED NOT DETECTED   Listeria monocytogenes NOT DETECTED NOT DETECTED   Staphylococcus species DETECTED (A) NOT DETECTED   Staphylococcus aureus (BCID) NOT DETECTED NOT DETECTED   Staphylococcus epidermidis DETECTED (A) NOT DETECTED   Staphylococcus lugdunensis NOT DETECTED NOT DETECTED   Streptococcus species NOT DETECTED NOT DETECTED   Streptococcus agalactiae NOT DETECTED NOT DETECTED   Streptococcus pneumoniae NOT DETECTED NOT DETECTED   Streptococcus pyogenes NOT DETECTED NOT DETECTED   A.calcoaceticus-baumannii NOT DETECTED NOT DETECTED   Bacteroides fragilis NOT DETECTED NOT DETECTED   Enterobacterales NOT DETECTED NOT DETECTED   Enterobacter cloacae complex NOT DETECTED NOT DETECTED   Escherichia coli NOT DETECTED NOT DETECTED   Klebsiella aerogenes NOT DETECTED NOT DETECTED   Klebsiella oxytoca NOT DETECTED NOT DETECTED   Klebsiella pneumoniae NOT DETECTED NOT DETECTED   Proteus species NOT DETECTED NOT DETECTED   Salmonella species NOT DETECTED NOT DETECTED   Serratia marcescens NOT DETECTED NOT DETECTED   Haemophilus influenzae NOT DETECTED NOT DETECTED   Neisseria meningitidis NOT  DETECTED NOT DETECTED   Pseudomonas aeruginosa NOT DETECTED NOT DETECTED   Stenotrophomonas maltophilia NOT DETECTED NOT DETECTED   Candida albicans NOT DETECTED NOT DETECTED   Candida auris NOT DETECTED NOT DETECTED   Candida glabrata NOT DETECTED NOT DETECTED   Candida krusei NOT DETECTED NOT DETECTED   Candida parapsilosis NOT DETECTED NOT DETECTED   Candida tropicalis NOT DETECTED NOT DETECTED   Cryptococcus neoformans/gattii NOT DETECTED NOT DETECTED   Methicillin resistance mecA/C DETECTED (A) NOT DETECTED    [name] 03/03/2020  3:12 PM

## 2020-03-03 NOTE — Progress Notes (Addendum)
Progress Note    [name].  ELF:[id number] DOB: 1930-03-02  DOA: 03/02/2020 PCP: Dettinger, Fransisca Kaufmann, MD      Brief Narrative:    Medical records reviewed and are as summarized below:  Bobby Kamara. is a 87 y.o. male       Assessment/Plan:   Principal Problem:   Sepsis ([place]) Active Problems:   Pyelonephritis   Bilateral pneumonia   Type 2 diabetes mellitus with diabetic nephropathy ([place])   Essential hypertension   CAD (coronary artery disease)   Spina bifida ([place])   PVD (peripheral vascular disease) ([place])   Chronic kidney disease, stage 3a ([place])   Acute pyelonephritis   S/P AKA (above knee amputation), right (HCC)   Sepsis secondary to acute left pyelonephritis and community-acquired pneumonia, leukocytosis: Chest x-ray did not show any acute abnormality.  However, CT scan of the chest obtained today showed findings concerning for acute bilateral upper lobe pneumonia superimposed on chronic interstitial lung disease/fibrosis.  Continue empiric IV antibiotics.  Follow-up cultures.  MRSA by PCR was negative.  Staph epidermidis bacteremia present in 1 out of 4 bottles: This is likely a contaminant.  Type II DM with diabetic neuropathy: Humalog as needed for hyperglycemia.  CAD, PVD status post right AKA, spina bifida: Continue simvastatin, aspirin and Plavix.  Hypertension: Continue metoprolol  CKD stage IIIb: Creatinine is stable.  Stage III sacral decubitus ulcer: Consult wound care nurse.  Continue local wound care.    Body mass index is 23.4 kg/m.    Pressure Injury 03/03/20 Foot Anterior;Left (Active)  03/03/20 0400  Location: Foot  Location Orientation: Anterior;Left  Staging:   Wound Description (Comments):   Present on Admission:      Pressure Injury 03/03/20 Sacrum (Active)  03/03/20 0400  Location: Sacrum  Location Orientation:   Staging:   Wound Description (Comments):   Present on Admission:      Pressure Injury  03/03/20 Buttocks (Active)  03/03/20 0400  Location: Buttocks  Location Orientation:   Staging:   Wound Description (Comments):   Present on Admission:           Diet Order            Diet heart healthy/carb modified Room service appropriate? Yes; Fluid consistency: Thin  Diet effective now                      Medications:   . aspirin  81 mg Oral Daily  . [START ON 03/04/2020] buPROPion  150 mg Oral Daily  . clopidogrel  75 mg Oral Daily  . docusate sodium  100 mg Oral QHS  . enoxaparin (LOVENOX) injection  40 mg Subcutaneous Q24H  . ezetimibe  10 mg Oral QHS  . Glycerin-Hypromellose-PEG 400  1 drop Ophthalmic TID  . insulin aspart  0-15 Units Subcutaneous TID WC  . insulin aspart  0-5 Units Subcutaneous QHS  . melatonin  5 mg Oral QHS  . metoprolol tartrate  12.5 mg Oral BID  . nystatin   Topical BID  . [START ON 03/04/2020] pantoprazole  40 mg Oral Daily  . senna  2 tablet Oral BID  . simvastatin  20 mg Oral QHS   Continuous Infusions: . azithromycin    . cefTRIAXone (ROCEPHIN)  IV       Anti-infectives (From admission, onward)   Start     Dose/Rate Route Frequency Ordered Stop   03/03/20 1930  cefTRIAXone (ROCEPHIN) 1 g in  sodium chloride 0.9 % 100 mL IVPB        1 g 200 mL/hr over 30 Minutes Intravenous Every 24 hours 03/02/20 2212     03/03/20 1800  azithromycin (ZITHROMAX) 500 mg in sodium chloride 0.9 % 250 mL IVPB        500 mg 250 mL/hr over 60 Minutes Intravenous Every 24 hours 03/03/20 1553     03/02/20 1845  cefTRIAXone (ROCEPHIN) 2 g in sodium chloride 0.9 % 100 mL IVPB        2 g 200 mL/hr over 30 Minutes Intravenous  Once 03/02/20 1836 03/02/20 1939   03/02/20 1845  metroNIDAZOLE (FLAGYL) IVPB 500 mg        500 mg 100 mL/hr over 60 Minutes Intravenous  Once 03/02/20 1836 03/02/20 2055             Family Communication/Anticipated D/C date and plan/Code Status   DVT prophylaxis: enoxaparin (LOVENOX) injection 40 mg Start:  03/03/20 0600     Code Status: Full Code  Family Communication: None Disposition Plan:    Status is: Inpatient  Remains inpatient appropriate because:IV treatments appropriate due to intensity of illness or inability to take PO   Dispo: The patient is from: SNF              Anticipated d/c is to: SNF              Anticipated d/c date is: 2 days              Patient currently is not medically stable to d/c.           Subjective:   C/o cough and left flank pain  Objective:    Vitals:   03/03/20 0400 03/03/20 0449 03/03/20 0843 03/03/20 1231  BP:  (!) 95/48 (!) 117/56 (!) 114/53  Pulse:  80 81 76  Resp:  20 18 20   Temp:  97.9 F (36.6 C) 98.5 F (36.9 C) 98.4 F (36.9 C)  TempSrc:  Oral Oral Oral  SpO2:  99% 100% 99%  Weight: 69.8 kg     Height: 5\' 8"  (1.727 m)      No data found.   Intake/Output Summary (Last 24 hours) at 03/03/2020 1619 Last data filed at 03/03/2020 1414 Gross per 24 hour  Intake 3598.83 ml  Output 400 ml  Net 3198.83 ml   Filed Weights   03/03/20 0400  Weight: 69.8 kg    Exam:  GEN: NAD SKIN: Warm and dry EYES: No pallor or icterus ENT: MMM CV: RRR PULM: CTA B ABD: soft, ND, suprapubic tenderness, +BS CNS: AAO x 3, non focal EXT: Right right AKA.  No edema or tenderness  GU: Left CVA tenderness    Data Reviewed:   I have personally reviewed following labs and imaging studies:  Labs: Labs show the following:   Basic Metabolic Panel: Recent Labs  Lab 03/02/20 1414 03/02/20 2219 03/03/20 0423  NA 134*  --  135  K 4.7  --  4.9  CL 100  --  103  CO2 22  --  25  GLUCOSE 242*  --  147*  BUN 34*  --  37*  CREATININE 1.58* 1.54* 1.52*  CALCIUM 8.9  --  8.1*   GFR Estimated Creatinine Clearance: 33.1 mL/min (A) (by C-G formula based on SCr of 1.52 mg/dL (H)). Liver Function Tests: Recent Labs  Lab 03/02/20 1414  AST 14*  ALT 10  ALKPHOS 76  BILITOT 0.9  PROT 6.9  ALBUMIN 3.5   No results for input(s):  LIPASE, AMYLASE in the last 168 hours. No results for input(s): AMMONIA in the last 168 hours. Coagulation profile Recent Labs  Lab 03/03/20 0423  INR 1.1    CBC: Recent Labs  Lab 03/02/20 1414 03/02/20 2219 03/03/20 0423  WBC 19.8* 16.7* 14.4*  NEUTROABS 16.8*  --   --   HGB 11.7* 10.2* 9.4*  HCT 36.5* 32.0* 29.6*  MCV 91.5 92.8 92.2  PLT 230 200 178   Cardiac Enzymes: No results for input(s): CKTOTAL, CKMB, CKMBINDEX, TROPONINI in the last 168 hours. BNP (last 3 results) No results for input(s): PROBNP in the last 8760 hours. CBG: Recent Labs  Lab 03/02/20 2233 03/03/20 0754 03/03/20 1223  GLUCAP 152* 104* 195*   D-Dimer: No results for input(s): DDIMER in the last 72 hours. Hgb A1c: Recent Labs    03/02/20 2219  HGBA1C 6.5*   Lipid Profile: No results for input(s): CHOL, HDL, LDLCALC, TRIG, CHOLHDL, LDLDIRECT in the last 72 hours. Thyroid function studies: No results for input(s): TSH, T4TOTAL, T3FREE, THYROIDAB in the last 72 hours.  Invalid input(s): FREET3 Anemia work up: No results for input(s): VITAMINB12, FOLATE, FERRITIN, TIBC, IRON, RETICCTPCT in the last 72 hours. Sepsis Labs: Recent Labs  Lab 03/02/20 1414 03/02/20 2219 03/03/20 0423  PROCALCITON  --   --  1.02  WBC 19.8* 16.7* 14.4*  LATICACIDVEN 1.2  --   --     Microbiology Recent Results (from the past 240 hour(s))  Culture, blood (single)     Status: None (Preliminary result)   Collection Time: 03/02/20  7:16 PM   Specimen: BLOOD  Result Value Ref Range Status   Specimen Description BLOOD BLOOD RIGHT FOREARM  Final   Special Requests   Final    BOTTLES DRAWN AEROBIC AND ANAEROBIC Blood Culture adequate volume   Culture  Setup Time   Final    Organism ID to follow GRAM POSITIVE COCCI ANAEROBIC BOTTLE ONLY CRITICAL RESULT CALLED TO, READ BACK BY AND VERIFIED WITH: SUSAN WATSON @1455  03/03/20 MJU Performed at [facility], [address] [place], [place] [address]     Culture GRAM POSITIVE COCCI  Final   Report Status PENDING  Incomplete  Blood Culture ID Panel (Reflexed)     Status: Abnormal   Collection Time: 03/02/20  7:16 PM  Result Value Ref Range Status   Enterococcus faecalis NOT DETECTED NOT DETECTED Final   Enterococcus Faecium NOT DETECTED NOT DETECTED Final   Listeria monocytogenes NOT DETECTED NOT DETECTED Final   Staphylococcus species DETECTED (A) NOT DETECTED Final    Comment: CRITICAL RESULT CALLED TO, READ BACK BY AND VERIFIED WITH: SUSAN WATSON @1455  03/03/20 MJU    Staphylococcus aureus (BCID) NOT DETECTED NOT DETECTED Final   Staphylococcus epidermidis DETECTED (A) NOT DETECTED Final    Comment: Methicillin (oxacillin) resistant coagulase negative staphylococcus. Possible blood culture contaminant (unless isolated from more than one blood culture draw or clinical case suggests pathogenicity). No antibiotic treatment is indicated for blood  culture contaminants. CRITICAL RESULT CALLED TO, READ BACK BY AND VERIFIED WITH: SUSAN WATSON @1455  03/03/20 MJU    Staphylococcus lugdunensis NOT DETECTED NOT DETECTED Final   Streptococcus species NOT DETECTED NOT DETECTED Final   Streptococcus agalactiae NOT DETECTED NOT DETECTED Final   Streptococcus pneumoniae NOT DETECTED NOT DETECTED Final   Streptococcus pyogenes NOT DETECTED NOT DETECTED Final   A.calcoaceticus-baumannii NOT DETECTED NOT DETECTED Final   Bacteroides fragilis  NOT DETECTED NOT DETECTED Final   Enterobacterales NOT DETECTED NOT DETECTED Final   Enterobacter cloacae complex NOT DETECTED NOT DETECTED Final   Escherichia coli NOT DETECTED NOT DETECTED Final   Klebsiella aerogenes NOT DETECTED NOT DETECTED Final   Klebsiella oxytoca NOT DETECTED NOT DETECTED Final   Klebsiella pneumoniae NOT DETECTED NOT DETECTED Final   Proteus species NOT DETECTED NOT DETECTED Final   Salmonella species NOT DETECTED NOT DETECTED Final   Serratia marcescens NOT DETECTED NOT DETECTED Final    Haemophilus influenzae NOT DETECTED NOT DETECTED Final   Neisseria meningitidis NOT DETECTED NOT DETECTED Final   Pseudomonas aeruginosa NOT DETECTED NOT DETECTED Final   Stenotrophomonas maltophilia NOT DETECTED NOT DETECTED Final   Candida albicans NOT DETECTED NOT DETECTED Final   Candida auris NOT DETECTED NOT DETECTED Final   Candida glabrata NOT DETECTED NOT DETECTED Final   Candida krusei NOT DETECTED NOT DETECTED Final   Candida parapsilosis NOT DETECTED NOT DETECTED Final   Candida tropicalis NOT DETECTED NOT DETECTED Final   Cryptococcus neoformans/gattii NOT DETECTED NOT DETECTED Final   Methicillin resistance mecA/C DETECTED (A) NOT DETECTED Final    Comment: CRITICAL RESULT CALLED TO, READ BACK BY AND VERIFIED WITH: SUSAN WATSON @1455  03/03/20 MJU Performed at [facility], [address] [place], [place] [address]   Resp Panel by RT-PCR (Flu A&B, Covid) Nasopharyngeal Swab     Status: None   Collection Time: 03/02/20  9:42 PM   Specimen: Nasopharyngeal Swab; Nasopharyngeal(NP) swabs in vial transport medium  Result Value Ref Range Status   SARS Coronavirus 2 by RT PCR NEGATIVE NEGATIVE Final    Comment: (NOTE) SARS-CoV-2 target nucleic acids are NOT DETECTED.  The SARS-CoV-2 RNA is generally detectable in upper respiratory specimens during the acute phase of infection. The lowest concentration of SARS-CoV-2 viral copies this assay can detect is 138 copies/mL. A negative result does not preclude SARS-Cov-2 infection and should not be used as the sole basis for treatment or other patient management decisions. A negative result may occur with  improper specimen collection/handling, submission of specimen other than nasopharyngeal swab, presence of viral mutation(s) within the areas targeted by this assay, and inadequate number of viral copies(<138 copies/mL). A negative result must be combined with clinical observations, patient history, and  epidemiological information. The expected result is Negative.  Fact Sheet for Patients:  EntrepreneurPulse.com.au  Fact Sheet for Healthcare Providers:  IncredibleEmployment.be  This test is no t yet approved or cleared by the [place] FDA and  has been authorized for detection and/or diagnosis of SARS-CoV-2 by FDA under an Emergency Use Authorization (EUA). This EUA will remain  in effect (meaning this test can be used) for the duration of the COVID-19 declaration under Section 564(b)(1) of the Act, 21 U.S.C.section 360bbb-3(b)(1), unless the authorization is terminated  or revoked sooner.       Influenza A by PCR NEGATIVE NEGATIVE Final   Influenza B by PCR NEGATIVE NEGATIVE Final    Comment: (NOTE) The Xpert Xpress SARS-CoV-2/FLU/RSV plus assay is intended as an aid in the diagnosis of influenza from Nasopharyngeal swab specimens and should not be used as a sole basis for treatment. Nasal washings and aspirates are unacceptable for Xpert Xpress SARS-CoV-2/FLU/RSV testing.  Fact Sheet for Patients: EntrepreneurPulse.com.au  Fact Sheet for Healthcare Providers: IncredibleEmployment.be  This test is not yet approved or cleared by the [place] FDA and has been authorized for detection and/or diagnosis of SARS-CoV-2 by FDA under an Emergency  Use Authorization (EUA). This EUA will remain in effect (meaning this test can be used) for the duration of the COVID-19 declaration under Section 564(b)(1) of the Act, 21 U.S.C. section 360bbb-3(b)(1), unless the authorization is terminated or revoked.  Performed at [facility], [address] [place], [place] [address]   MRSA PCR Screening     Status: None   Collection Time: 03/03/20  7:00 AM   Specimen: Nasal Mucosa; Nasopharyngeal  Result Value Ref Range Status   MRSA by PCR NEGATIVE NEGATIVE Final    Comment:        The GeneXpert MRSA  Assay (FDA approved for NASAL specimens only), is one component of a comprehensive MRSA colonization surveillance program. It is not intended to diagnose MRSA infection nor to guide or monitor treatment for MRSA infections. Performed at [facility], [address], [place], [place] [address]     Procedures and diagnostic studies:  DG Chest 2 View  Result Date: 03/02/2020 CLINICAL DATA:  Dry cough for 10 days.  Short of breath. EXAM: CHEST - 2 VIEW COMPARISON:  11/11/2018 FINDINGS: Stable changes from prior cardiac surgery. Cardiac silhouette is normal in size. No mediastinal or hilar masses. No evidence of adenopathy. Clear lungs.  No pleural effusion or pneumothorax. Skeletal structures are intact. IMPRESSION: No acute cardiopulmonary disease. Electronically Signed   By: Lajean Manes M.D.   On: 03/02/2020 15:14   CT CHEST WO CONTRAST  Result Date: 03/03/2020 CLINICAL DATA:  Worsening cough. Pneumonia, effusion or abscess suspected. EXAM: CT CHEST WITHOUT CONTRAST TECHNIQUE: Multidetector CT imaging of the chest was performed following the standard protocol without IV contrast. COMPARISON:  Chest CT dated 11/02/2006. FINDINGS: Cardiovascular: Aortic atherosclerosis. No thoracic aortic aneurysm. No pericardial effusion. Extensive coronary artery calcifications. Status post surgical changes of CABG. Mediastinum/Nodes: No mass or enlarged lymph nodes are seen within the mediastinum. Esophagus is unremarkable. Trachea is unremarkable. Lungs/Pleura: There is bilateral peripheral interlobular septal thickening, basilar predominant, likely chronic, with associated traction bronchiectasis suggesting chronic interstitial lung disease, most likely NSIP (nonspecific interstitial pneumonia) versus chronic hypersensitivity pneumonitis based on appearance. Associated chronic scarring/pleural thickening at the lung bases. Subtle ground-glass opacities within the LEFT upper lobe and RIGHT upper lobe,  suspicious for superimposed pneumonia. No pleural effusion or pneumothorax. Upper Abdomen: Limited images of the upper abdomen are unremarkable. Musculoskeletal: Degenerative spondylosis of the slightly kyphotic thoracic spine, mild to moderate in degree. No acute appearing osseous abnormality. IMPRESSION: 1. Subtle ground-glass opacities within the LEFT upper lobe and RIGHT upper lobe, suspicious for pneumonia superimposed on chronic interstitial lung disease/fibrosis. Differential includes atypical pneumonias such as viral or fungal, interstitial pneumonias, and respiratory bronchiolitis. COVID-19 pneumonia can have this appearance. 2. Additional chronic/incidental findings detailed above. Aortic Atherosclerosis (ICD10-I70.0). Electronically Signed   By: Franki Cabot M.D.   On: 03/03/2020 12:52   CT ABDOMEN PELVIS W CONTRAST  Result Date: 03/02/2020 CLINICAL DATA:  Abdominal pain with nausea and vomiting. Bowel obstruction suspected. History of bowel obstruction. Patient reports left hip pain. EXAM: CT ABDOMEN AND PELVIS WITH CONTRAST TECHNIQUE: Multidetector CT imaging of the abdomen and pelvis was performed using the standard protocol following bolus administration of intravenous contrast. CONTRAST:  31mL OMNIPAQUE IOHEXOL 300 MG/ML  SOLN COMPARISON:  Remote CT 11/09/2006 FINDINGS: Lower chest: Bilateral lower lobe bronchiectasis and atelectasis. Coronary artery calcifications. Mild cardiomegaly. Motion artifact limits detailed assessment. Hepatobiliary: Motion artifact through the liver. Punctate parenchymal calcifications typical of prior granulomatous disease. No evidence of focal lesion allowing  for motion. Mild gallbladder distention without calcified gallstone or pericholecystic inflammation. There is no biliary dilatation. Pancreas: No ductal dilatation or inflammation. Spleen: Normal in size without focal abnormality. Adrenals/Urinary Tract: No adrenal nodule. Symmetric bilateral perinephric edema.  Mild dilatation of the renal pelvis ease and ureters, with mild left ureteral thickening and enhancement. No frank hydronephrosis. 2.7 cm cyst in the mid left kidney. Additional cortical hypodensity in the lower left kidney is too small to characterize. The urinary bladder is diffusely irregular, trabeculated, with irregular wall thickening. Area of low-density in the thickened right bladder dome is nonspecific. There is mild perivesicular fat stranding. Stomach/Bowel: Unremarkable stomach. Normal positioning of the duodenum and ligament of Treitz. There is no bowel obstruction, administered enteric contrast reaches the colon. No small bowel inflammation or wall thickening. High-riding cecum in the right mid abdomen. Normal terminal ileum. Appendix is not confidently visualized. No evidence of appendicitis. Moderate volume of colonic stool with colonic tortuosity. Mild distal colonic diverticulosis. No diverticulitis. No colonic inflammation. Vascular/Lymphatic: Aortic and branch atherosclerosis. No acute vascular findings. Retroaortic left renal vein. Patent portal vein. No enlarged lymph nodes in the abdomen or pelvis. Reproductive: Possible prostatectomy. Other: Tiny fat containing umbilical hernia. No free air, free fluid, or intra-abdominal fluid collection. Musculoskeletal: Bones are diffusely under mineralized. Multilevel degenerative change throughout the spine. Fatty atrophy of the right ileus psoas and bilateral gluteal musculature, progressed from prior. No acute osseous abnormalities. IMPRESSION: 1. No bowel obstruction. Moderate volume of colonic stool with colonic tortuosity, suggesting constipation. Mild distal colonic diverticulosis without diverticulitis. 2. Diffusely irregular, trabeculated urinary bladder with irregular wall thickening and mild perivesicular fat stranding. Findings are suspicious for cystitis, patient has listed history of cystectomy, wall thickening may be in part chronic. Area  of low-density in the thickened right bladder dome is nonspecific, but may represent focal infection. Mild left ureteral thickening and enhancement suspicious for ascending urinary tract infection. 3. Mild gallbladder distention without calcified gallstone or pericholecystic inflammation. 4. Bilateral lower lobe bronchiectasis and atelectasis in the included lung bases. Aortic Atherosclerosis (ICD10-I70.0). Electronically Signed   By: Keith Rake M.D.   On: 03/02/2020 21:28               LOS: 1 day   [name]  [facility]   Pager on [email or web address]. If 7PM-7AM, please contact night-coverage at [email or web address]     03/03/2020, 4:19 PM

## 2020-03-03 NOTE — Progress Notes (Addendum)
   03/03/20 1659  Assess: MEWS Score  Temp (!) 101.5 F (38.6 C)  BP (!) 107/58  Pulse Rate 90  Resp (!) 22  SpO2 98 %  Assess: MEWS Score  MEWS Temp 2  MEWS Systolic 0  MEWS Pulse 0  MEWS RR 1  MEWS LOC 0  MEWS Score 3  MEWS Score Color Yellow  Assess: if the MEWS score is Yellow or Red  Were vital signs taken at a resting state? Yes  Focused Assessment No change from prior assessment  Early Detection of Sepsis Score *See Row Information* Medium  MEWS guidelines implemented *See Row Information* No, previously yellow, continue vital signs every 4 hours  Treat  MEWS Interventions Administered prn meds/treatments  Pain Scale 0-10  Pain Score 0  Take Vital Signs  Increase Vital Sign Frequency  Yellow: Q 2hr X 2 then Q 4hr X 2, if remains yellow, continue Q 4hrs  Escalate  MEWS: Escalate Yellow: discuss with charge nurse/RN and consider discussing with provider and RRT  Notify: Charge Nurse/RN  Name of Charge Nurse/RN Notified Tamara, RN  Date Charge Nurse/RN Notified 03/03/20  Time Charge Nurse/RN Notified 1703  Notify: Provider  Provider Name/Title Jennye Boroughs  Date Provider Notified 03/03/20  Time Provider Notified 1703  Notification Type Page  Notification Reason Change in status  Document  Patient Outcome Stabilized after interventions  Progress note created (see row info) Yes  Pt noted to have yellow MEWS at this time. Pt denies distress, chills, pain. Fever noted upon VS taken.  Tylenol given. Pt has achieved 1079ml of incentive spirometer every 1 hour this shift x 10 reps. Zmax started per order. Awaiting MD Ayiku response to message about medium risk yellow MEWS score.   Update: MD replied at 1812 and ordered blood cultures.   Update: temp 99.3 axillary as patient is eating post tylenol.

## 2020-03-03 NOTE — ED Notes (Signed)
Pt observed to have chest congestion and frequent coughing -pt a&ox4 reporting also feeling nauseous and c/o chronic L hip pain rated 5/10 described as throbbing - pt requests Tylenol for pain and will receive Zofran first for nausea.  Placed on 2L O2 via [place] due O2 desat at rest to 90-91%- Pt now 98% on 2L O2 via [place]

## 2020-03-03 NOTE — Treatment Plan (Signed)
Son called and updated at this time.

## 2020-03-04 DIAGNOSIS — N10 Acute tubulo-interstitial nephritis: Secondary | ICD-10-CM | POA: Diagnosis not present

## 2020-03-04 DIAGNOSIS — J189 Pneumonia, unspecified organism: Secondary | ICD-10-CM | POA: Diagnosis not present

## 2020-03-04 DIAGNOSIS — I10 Essential (primary) hypertension: Secondary | ICD-10-CM | POA: Diagnosis not present

## 2020-03-04 DIAGNOSIS — A419 Sepsis, unspecified organism: Secondary | ICD-10-CM | POA: Diagnosis not present

## 2020-03-04 LAB — URINE CULTURE

## 2020-03-04 LAB — BASIC METABOLIC PANEL
Anion gap: 6 (ref 5–15)
BUN: 27 mg/dL — ABNORMAL HIGH (ref 8–23)
CO2: 25 mmol/L (ref 22–32)
Calcium: 7.8 mg/dL — ABNORMAL LOW (ref 8.9–10.3)
Chloride: 103 mmol/L (ref 98–111)
Creatinine, Ser: 1.33 mg/dL — ABNORMAL HIGH (ref 0.61–1.24)
GFR, Estimated: 52 mL/min — ABNORMAL LOW (ref >60)
Glucose, Bld: 100 mg/dL — ABNORMAL HIGH (ref 70–99)
Potassium: 3.8 mmol/L (ref 3.5–5.1)
Sodium: 134 mmol/L — ABNORMAL LOW (ref 135–145)

## 2020-03-04 LAB — GLUCOSE, CAPILLARY
Glucose-Capillary: 90 mg/dL (ref 70–99)
Glucose-Capillary: 146 mg/dL — ABNORMAL HIGH (ref 70–99)
Glucose-Capillary: 169 mg/dL — ABNORMAL HIGH (ref 70–99)
Glucose-Capillary: 149 mg/dL — ABNORMAL HIGH (ref 70–99)

## 2020-03-04 LAB — CBC WITH DIFFERENTIAL/PLATELET
Abs Immature Granulocytes: 0.03 10*3/uL (ref 0.00–0.07)
Basophils Absolute: 0 10*3/uL (ref 0.0–0.1)
Basophils Relative: 0 %
Eosinophils Absolute: 0.2 10*3/uL (ref 0.0–0.5)
Eosinophils Relative: 2 %
HCT: 27.8 % — ABNORMAL LOW (ref 39.0–52.0)
Hemoglobin: 9 g/dL — ABNORMAL LOW (ref 13.0–17.0)
Immature Granulocytes: 0 %
Lymphocytes Relative: 19 %
Lymphs Abs: 1.9 10*3/uL (ref 0.7–4.0)
MCH: 29.8 pg (ref 26.0–34.0)
MCHC: 32.4 g/dL (ref 30.0–36.0)
MCV: 92.1 fL (ref 80.0–100.0)
Monocytes Absolute: 1.2 10*3/uL — ABNORMAL HIGH (ref 0.1–1.0)
Monocytes Relative: 12 %
Neutro Abs: 6.6 10*3/uL (ref 1.7–7.7)
Neutrophils Relative %: 67 %
Platelets: 160 10*3/uL (ref 150–400)
RBC: 3.02 MIL/uL — ABNORMAL LOW (ref 4.22–5.81)
RDW: 14.1 % (ref 11.5–15.5)
WBC: 10 10*3/uL (ref 4.0–10.5)
nRBC: 0 % (ref 0.0–0.2)

## 2020-03-04 MED ORDER — SODIUM CHLORIDE 0.9 % IV SOLN
2.0000 g | INTRAVENOUS | Status: DC
  Administered 2020-03-04 – 2020-03-05 (×2): 2 g via INTRAVENOUS
  Filled 2020-03-04: qty 20
  Filled 2020-03-04: qty 2
  Filled 2020-03-04: qty 20

## 2020-03-04 NOTE — Consult Note (Signed)
[facility] Nurse Consult Note: Reason for Consult:Sacral Stage 4 pressure injury. Wounding occurred approximately 8 years ago and currently is nearly healed.  Due to the nature and type of tissue that deposits into healed full thickness defects to achieve scarring and closure, the wound will always be considered a Stage 4.  Wound type:Pressure Pressure Injury POA: Yes Measurement:6cm x 10cm area of scattered partial thickness friction injuries over previously closed scar tissue. No bleeding, no drainage. Pink, moist wound beds. Wound bed:As noted above Drainage (amount, consistency, odor) As noted above Periwound: Intact with evidence of previous  Dressing procedure/placement/frequency: Patient turns and repositions himself, but may require cueing while in the hospital. Due to a RLE amputation, the left heel is at increased risk for PI.  I have provided guidance for wound care using an antimicrobial nonadherent as a wound contact layer and topping with a silicone foam dressing. A pressure redistribution heel boot is provided for the left foot and a pressure redistribution chair pad is provided for post acute care use.  Thank you for inviting me to consult on this nice gentleman.  [place] nursing team will not follow, but will remain available to this patient, the nursing and medical teams.  Please re-consult if needed.  Maudie Flakes, MSN, RN, [place], [facility]  Pager# [phone]

## 2020-03-04 NOTE — Progress Notes (Addendum)
   03/04/20 1330  Clinical Encounter Type  Visited With Patient  Visit Type Initial  Referral From Nurse  Consult/Referral To Chaplain  Chaplain responded to an OR. Pt shared many of his life events. 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 He talked about his time in the [employer]. It was a pleasure talking with him. His lunch had been sitting on his table for more than 20 minutes, therefore chaplain ended the visited. Before leaving chaplain asked if she could pray with him and he said yes. While chaplain was charting Pt's nurse asked if she had an audible Bible. Chaplain said no, but that she could add the Bible appl to Pt's phone and there is an audible selection in the appl. This was an awesome suggestion and after chaplain load the appl on his phone, his daughter was going to make sure he could access the audible version.

## 2020-03-04 NOTE — Progress Notes (Addendum)
Progress Note    [name].  IDP:[id number] DOB: 1930-03-01  DOA: 03/02/2020 PCP: Dettinger, Fransisca Kaufmann, MD      Brief Narrative:    Medical records reviewed and are as summarized below:  [name]. is a 87 y.o. male       Assessment/Plan:   Principal Problem:   Sepsis ([place]) Active Problems:   Pyelonephritis   Bilateral pneumonia   Type 2 diabetes mellitus with diabetic nephropathy ([place])   Essential hypertension   CAD (coronary artery disease)   Spina bifida ([place])   PVD (peripheral vascular disease) ([place])   Chronic kidney disease, stage 3a ([place])   Acute pyelonephritis   S/P AKA (above knee amputation), right (HCC)   Fever, sepsis secondary to acute left pyelonephritis and community-acquired pneumonia, leukocytosis: Chest x-ray did not show any acute abnormality.  However, CT scan of the chest showed findings concerning for acute bilateral upper lobe pneumonia superimposed on chronic interstitial lung disease/fibrosis.  Continue empiric IV antibiotics.  Follow-up cultures.  MRSA by PCR was negative.  Staph epidermidis bacteremia present in 1 out of 4 bottles: This is likely a contaminant.  Blood culture has been repeated.  Type II DM with diabetic neuropathy: Humalog as needed for hyperglycemia.  CAD, PVD status post right AKA, spina bifida: Continue simvastatin, aspirin and Plavix.  Hypertension: Continue metoprolol  CKD stage IIIb: Creatinine is stable.  Diarrhea: This may be due to antibiotics.  Check stool for C. difficile toxin.  Stage III sacral decubitus ulcer, stage I left heel decubitus ulcer: Allergies represents an infection.  Continue local wound care.  Consult wound care nurse.      Body mass index is 23.4 kg/m.    Pressure Injury 03/03/20 Foot Anterior;Left (Active)  03/03/20 0400  Location: Foot  Location Orientation: Anterior;Left  Staging:   Wound Description (Comments):   Present on Admission:      Pressure  Injury 03/03/20 Sacrum (Active)  03/03/20 0400  Location: Sacrum  Location Orientation:   Staging:   Wound Description (Comments):   Present on Admission:      Pressure Injury 03/03/20 Buttocks (Active)  03/03/20 0400  Location: Buttocks  Location Orientation:   Staging:   Wound Description (Comments):   Present on Admission:           Diet Order            Diet heart healthy/carb modified Room service appropriate? Yes; Fluid consistency: Thin  Diet effective now                      Medications:   . aspirin  81 mg Oral Daily  . buPROPion  150 mg Oral Daily  . clopidogrel  75 mg Oral Daily  . docusate sodium  100 mg Oral QHS  . enoxaparin (LOVENOX) injection  40 mg Subcutaneous Q24H  . ezetimibe  10 mg Oral QHS  . insulin aspart  0-15 Units Subcutaneous TID WC  . insulin aspart  0-5 Units Subcutaneous QHS  . melatonin  5 mg Oral QHS  . metoprolol tartrate  12.5 mg Oral BID  . nystatin   Topical BID  . pantoprazole  40 mg Oral Daily  . senna  2 tablet Oral BID  . simvastatin  20 mg Oral QHS   Continuous Infusions: . azithromycin Stopped (03/03/20 1756)  . cefTRIAXone (ROCEPHIN)  IV       Anti-infectives (From admission, onward)   Start  Dose/Rate Route Frequency Ordered Stop   03/04/20 2000  cefTRIAXone (ROCEPHIN) 2 g in sodium chloride 0.9 % 100 mL IVPB        2 g 200 mL/hr over 30 Minutes Intravenous Every 24 hours 03/04/20 0735     03/03/20 1930  cefTRIAXone (ROCEPHIN) 1 g in sodium chloride 0.9 % 100 mL IVPB  Status:  Discontinued        1 g 200 mL/hr over 30 Minutes Intravenous Every 24 hours 03/02/20 2212 03/04/20 0735   03/03/20 1800  azithromycin (ZITHROMAX) 500 mg in sodium chloride 0.9 % 250 mL IVPB        500 mg 250 mL/hr over 60 Minutes Intravenous Every 24 hours 03/03/20 1553     03/02/20 1845  cefTRIAXone (ROCEPHIN) 2 g in sodium chloride 0.9 % 100 mL IVPB        2 g 200 mL/hr over 30 Minutes Intravenous  Once 03/02/20 1836  03/02/20 1939   03/02/20 1845  metroNIDAZOLE (FLAGYL) IVPB 500 mg        500 mg 100 mL/hr over 60 Minutes Intravenous  Once 03/02/20 1836 03/02/20 2055             Family Communication/Anticipated D/C date and plan/Code Status   DVT prophylaxis: enoxaparin (LOVENOX) injection 40 mg Start: 03/03/20 0600     Code Status: Full Code  Family Communication: None Disposition Plan:    Status is: Inpatient  Remains inpatient appropriate because:IV treatments appropriate due to intensity of illness or inability to take PO   Dispo: The patient is from: SNF              Anticipated d/c is to: SNF              Anticipated d/c date is: 2 days              Patient currently is not medically stable to d/c.           Subjective:   He still has a cough, lower abdominal pain and pain in the left flank.  He is having watery stools.  No vomiting, shortness of breath or chest pain.  Objective:    Vitals:   03/03/20 2305 03/04/20 0449 03/04/20 0808 03/04/20 1257  BP: 103/61 (!) 122/49 (!) 116/50 (!) 122/56  Pulse: 69 82 81 72  Resp:  20 18 18   Temp: 98.3 F (36.8 C) 98 F (36.7 C) 98.7 F (37.1 C) 99 F (37.2 C)  TempSrc: Oral Oral Oral Oral  SpO2: 99% 100% 99% 100%  Weight:      Height:       No data found.   Intake/Output Summary (Last 24 hours) at 03/04/2020 1502 Last data filed at 03/04/2020 0900 Gross per 24 hour  Intake 2020.04 ml  Output 1000 ml  Net 1020.04 ml   Filed Weights   03/03/20 0400  Weight: 69.8 kg    Exam:  GEN: NAD SKIN: Stage III sacral decubitus ulcer.  Stage I left heel decubitus ulcer EYES: No pallor or icterus. ENT: MMM CV: RRR PULM: CTA B ABD: soft, ND, no suprapubic tenderness, +BS CNS: AAO x 3, non focal EXT: Right AKA.  No edema or tenderness. GU: Mild left CVA tenderness.       Data Reviewed:   I have personally reviewed following labs and imaging studies:  Labs: Labs show the following:   Basic Metabolic  Panel: Recent Labs  Lab 03/02/20 1414 03/02/20 2219 03/03/20 0423 03/04/20  0347  NA 134*  --  135 134*  K 4.7  --  4.9 3.8  CL 100  --  103 103  CO2 22  --  25 25  GLUCOSE 242*  --  147* 100*  BUN 34*  --  37* 27*  CREATININE 1.58* 1.54* 1.52* 1.33*  CALCIUM 8.9  --  8.1* 7.8*   GFR Estimated Creatinine Clearance: 37.9 mL/min (A) (by C-G formula based on SCr of 1.33 mg/dL (H)). Liver Function Tests: Recent Labs  Lab 03/02/20 1414  AST 14*  ALT 10  ALKPHOS 76  BILITOT 0.9  PROT 6.9  ALBUMIN 3.5   No results for input(s): LIPASE, AMYLASE in the last 168 hours. No results for input(s): AMMONIA in the last 168 hours. Coagulation profile Recent Labs  Lab 03/03/20 0423  INR 1.1    CBC: Recent Labs  Lab 03/02/20 1414 03/02/20 2219 03/03/20 0423 03/04/20 0347  WBC 19.8* 16.7* 14.4* 10.0  NEUTROABS 16.8*  --   --  6.6  HGB 11.7* 10.2* 9.4* 9.0*  HCT 36.5* 32.0* 29.6* 27.8*  MCV 91.5 92.8 92.2 92.1  PLT 230 200 178 160   Cardiac Enzymes: No results for input(s): CKTOTAL, CKMB, CKMBINDEX, TROPONINI in the last 168 hours. BNP (last 3 results) No results for input(s): PROBNP in the last 8760 hours. CBG: Recent Labs  Lab 03/03/20 1223 03/03/20 1659 03/03/20 2104 03/04/20 0753 03/04/20 1128  GLUCAP 195* 123* 107* 90 146*   D-Dimer: No results for input(s): DDIMER in the last 72 hours. Hgb A1c: Recent Labs    03/02/20 2219  HGBA1C 6.5*   Lipid Profile: No results for input(s): CHOL, HDL, LDLCALC, TRIG, CHOLHDL, LDLDIRECT in the last 72 hours. Thyroid function studies: No results for input(s): TSH, T4TOTAL, T3FREE, THYROIDAB in the last 72 hours.  Invalid input(s): FREET3 Anemia work up: No results for input(s): VITAMINB12, FOLATE, FERRITIN, TIBC, IRON, RETICCTPCT in the last 72 hours. Sepsis Labs: Recent Labs  Lab 03/02/20 1414 03/02/20 2219 03/03/20 0423 03/04/20 0347  PROCALCITON  --   --  1.02  --   WBC 19.8* 16.7* 14.4* 10.0  LATICACIDVEN  1.2  --   --   --     Microbiology Recent Results (from the past 240 hour(s))  Culture, blood (single)     Status: None (Preliminary result)   Collection Time: 03/02/20  7:16 PM   Specimen: BLOOD  Result Value Ref Range Status   Specimen Description   Final    BLOOD BLOOD RIGHT FOREARM Performed at [facility], [address], [place], [place] [address]    Special Requests   Final    BOTTLES DRAWN AEROBIC AND ANAEROBIC Blood Culture adequate volume Performed at [facility], [address] [place], [place] [address]    Culture  Setup Time   Final    Organism ID to follow [place] CRITICAL RESULT CALLED TO, READ BACK BY AND VERIFIED WITH: [place] @1455  03/03/20 MJU Performed at [facility], [address] [place], [place] [address]    Culture   Final    Lonell Grandchild POSITIVE COCCI TOO YOUNG TO READ Performed at [facility], [place] [address], [place], [place] [address]    Report Status PENDING  Incomplete  Urine Culture     Status: Abnormal   Collection Time: 03/02/20  7:16 PM   Specimen: Urine, Random  Result Value Ref Range Status   Specimen Description  Final    URINE, RANDOM Performed at [facility], [address] [place], [place] [address]    Special Requests   Final    NONE Performed at [facility], [address] [place], [place] [address]    Culture MULTIPLE SPECIES PRESENT, SUGGEST RECOLLECTION (A)  Final   Report Status 03/04/2020 FINAL  Final  Blood Culture ID Panel (Reflexed)     Status: Abnormal   Collection Time: 03/02/20  7:16 PM  Result Value Ref Range Status   Enterococcus faecalis NOT DETECTED NOT DETECTED Final   Enterococcus Faecium NOT DETECTED NOT DETECTED Final   Listeria monocytogenes NOT DETECTED NOT DETECTED Final   Staphylococcus species DETECTED (A) NOT DETECTED Final    Comment: CRITICAL RESULT CALLED TO, READ BACK BY AND VERIFIED WITH: SUSAN  WATSON @1455  03/03/20 MJU    Staphylococcus aureus (BCID) NOT DETECTED NOT DETECTED Final   Staphylococcus epidermidis DETECTED (A) NOT DETECTED Final    Comment: Methicillin (oxacillin) resistant coagulase negative staphylococcus. Possible blood culture contaminant (unless isolated from more than one blood culture draw or clinical case suggests pathogenicity). No antibiotic treatment is indicated for blood  culture contaminants. CRITICAL RESULT CALLED TO, READ BACK BY AND VERIFIED WITH: [place] @1455  03/03/20 MJU    Staphylococcus lugdunensis NOT DETECTED NOT DETECTED Final   Streptococcus species NOT DETECTED NOT DETECTED Final   Streptococcus agalactiae NOT DETECTED NOT DETECTED Final   Streptococcus pneumoniae NOT DETECTED NOT DETECTED Final   Streptococcus pyogenes NOT DETECTED NOT DETECTED Final   A.calcoaceticus-baumannii NOT DETECTED NOT DETECTED Final   Bacteroides fragilis NOT DETECTED NOT DETECTED Final   Enterobacterales NOT DETECTED NOT DETECTED Final   Enterobacter cloacae complex NOT DETECTED NOT DETECTED Final   Escherichia coli NOT DETECTED NOT DETECTED Final   Klebsiella aerogenes NOT DETECTED NOT DETECTED Final   Klebsiella oxytoca NOT DETECTED NOT DETECTED Final   Klebsiella pneumoniae NOT DETECTED NOT DETECTED Final   Proteus species NOT DETECTED NOT DETECTED Final   Salmonella species NOT DETECTED NOT DETECTED Final   Serratia marcescens NOT DETECTED NOT DETECTED Final   Haemophilus influenzae NOT DETECTED NOT DETECTED Final   Neisseria meningitidis NOT DETECTED NOT DETECTED Final   Pseudomonas aeruginosa NOT DETECTED NOT DETECTED Final   Stenotrophomonas maltophilia NOT DETECTED NOT DETECTED Final   Candida albicans NOT DETECTED NOT DETECTED Final   Candida auris NOT DETECTED NOT DETECTED Final   Candida glabrata NOT DETECTED NOT DETECTED Final   Candida krusei NOT DETECTED NOT DETECTED Final   Candida parapsilosis NOT DETECTED NOT DETECTED Final   Candida  tropicalis NOT DETECTED NOT DETECTED Final   Cryptococcus neoformans/gattii NOT DETECTED NOT DETECTED Final   Methicillin resistance mecA/C DETECTED (A) NOT DETECTED Final    Comment: CRITICAL RESULT CALLED TO, READ BACK BY AND VERIFIED WITH: SUSAN WATSON @1455  03/03/20 MJU Performed at [facility], [address] [place], [place] [address]   Resp Panel by RT-PCR (Flu A&B, Covid) Nasopharyngeal Swab     Status: None   Collection Time: 03/02/20  9:42 PM   Specimen: Nasopharyngeal Swab; Nasopharyngeal(NP) swabs in vial transport medium  Result Value Ref Range Status   SARS Coronavirus 2 by RT PCR NEGATIVE NEGATIVE Final    Comment: (NOTE) SARS-CoV-2 target nucleic acids are NOT DETECTED.  The SARS-CoV-2 RNA is generally detectable in upper respiratory specimens during the acute phase of infection. The lowest concentration of SARS-CoV-2 viral copies this assay can detect is 138 copies/mL. A negative  result does not preclude SARS-Cov-2 infection and should not be used as the sole basis for treatment or other patient management decisions. A negative result may occur with  improper specimen collection/handling, submission of specimen other than nasopharyngeal swab, presence of viral mutation(s) within the areas targeted by this assay, and inadequate number of viral copies(<138 copies/mL). A negative result must be combined with clinical observations, patient history, and epidemiological information. The expected result is Negative.  Fact Sheet for Patients:  EntrepreneurPulse.com.au  Fact Sheet for Healthcare Providers:  IncredibleEmployment.be  This test is no t yet approved or cleared by the [place] FDA and  has been authorized for detection and/or diagnosis of SARS-CoV-2 by FDA under an Emergency Use Authorization (EUA). This EUA will remain  in effect (meaning this test can be used) for the duration of the COVID-19 declaration  under Section 564(b)(1) of the Act, 21 U.S.C.section 360bbb-3(b)(1), unless the authorization is terminated  or revoked sooner.       Influenza A by PCR NEGATIVE NEGATIVE Final   Influenza B by PCR NEGATIVE NEGATIVE Final    Comment: (NOTE) The Xpert Xpress SARS-CoV-2/FLU/RSV plus assay is intended as an aid in the diagnosis of influenza from Nasopharyngeal swab specimens and should not be used as a sole basis for treatment. Nasal washings and aspirates are unacceptable for Xpert Xpress SARS-CoV-2/FLU/RSV testing.  Fact Sheet for Patients: EntrepreneurPulse.com.au  Fact Sheet for Healthcare Providers: IncredibleEmployment.be  This test is not yet approved or cleared by the [place] FDA and has been authorized for detection and/or diagnosis of SARS-CoV-2 by FDA under an Emergency Use Authorization (EUA). This EUA will remain in effect (meaning this test can be used) for the duration of the COVID-19 declaration under Section 564(b)(1) of the Act, 21 U.S.C. section 360bbb-3(b)(1), unless the authorization is terminated or revoked.  Performed at [facility], [address] [place], [place] [address]   MRSA PCR Screening     Status: None   Collection Time: 03/03/20  7:00 AM   Specimen: Nasal Mucosa; Nasopharyngeal  Result Value Ref Range Status   MRSA by PCR NEGATIVE NEGATIVE Final    Comment:        The GeneXpert MRSA Assay (FDA approved for NASAL specimens only), is one component of a comprehensive MRSA colonization surveillance program. It is not intended to diagnose MRSA infection nor to guide or monitor treatment for MRSA infections. Performed at [facility], [address] [place], [place] [address]   CULTURE, BLOOD (ROUTINE X 2) w Reflex to ID Panel     Status: None (Preliminary result)   Collection Time: 03/03/20  6:33 PM   Specimen: BLOOD  Result Value Ref Range Status   Specimen Description BLOOD  BLOOD RIGHT WRIST  Final   Special Requests   Final    BOTTLES DRAWN AEROBIC AND ANAEROBIC Blood Culture results may not be optimal due to an inadequate volume of blood received in culture bottles   Culture   Final    NO GROWTH < 12 HOURS Performed at [facility], [address] [place], [place] [address]    Report Status PENDING  Incomplete  CULTURE, BLOOD (ROUTINE X 2) w Reflex to ID Panel     Status: None (Preliminary result)   Collection Time: 03/03/20  6:33 PM   Specimen: BLOOD  Result Value Ref Range Status   Specimen Description BLOOD BLOOD RIGHT HAND  Final   Special Requests   Final    BOTTLES  DRAWN AEROBIC AND ANAEROBIC Blood Culture adequate volume   Culture   Final    NO GROWTH < 12 HOURS Performed at [facility], [address] [place], [place] [address]    Report Status PENDING  Incomplete    Procedures and diagnostic studies:  DG Chest 2 View  Result Date: 03/02/2020 CLINICAL DATA:  Dry cough for 10 days.  Short of breath. EXAM: CHEST - 2 VIEW COMPARISON:  11/11/2018 FINDINGS: Stable changes from prior cardiac surgery. Cardiac silhouette is normal in size. No mediastinal or hilar masses. No evidence of adenopathy. Clear lungs.  No pleural effusion or pneumothorax. Skeletal structures are intact. IMPRESSION: No acute cardiopulmonary disease. Electronically Signed   By: Lajean Manes M.D.   On: 03/02/2020 15:14   CT CHEST WO CONTRAST  Result Date: 03/03/2020 CLINICAL DATA:  Worsening cough. Pneumonia, effusion or abscess suspected. EXAM: CT CHEST WITHOUT CONTRAST TECHNIQUE: Multidetector CT imaging of the chest was performed following the standard protocol without IV contrast. COMPARISON:  Chest CT dated 11/02/2006. FINDINGS: Cardiovascular: Aortic atherosclerosis. No thoracic aortic aneurysm. No pericardial effusion. Extensive coronary artery calcifications. Status post surgical changes of CABG. Mediastinum/Nodes: No mass or enlarged lymph nodes are  seen within the mediastinum. Esophagus is unremarkable. Trachea is unremarkable. Lungs/Pleura: There is bilateral peripheral interlobular septal thickening, basilar predominant, likely chronic, with associated traction bronchiectasis suggesting chronic interstitial lung disease, most likely NSIP (nonspecific interstitial pneumonia) versus chronic hypersensitivity pneumonitis based on appearance. Associated chronic scarring/pleural thickening at the lung bases. Subtle ground-glass opacities within the LEFT upper lobe and RIGHT upper lobe, suspicious for superimposed pneumonia. No pleural effusion or pneumothorax. Upper Abdomen: Limited images of the upper abdomen are unremarkable. Musculoskeletal: Degenerative spondylosis of the slightly kyphotic thoracic spine, mild to moderate in degree. No acute appearing osseous abnormality. IMPRESSION: 1. Subtle ground-glass opacities within the LEFT upper lobe and RIGHT upper lobe, suspicious for pneumonia superimposed on chronic interstitial lung disease/fibrosis. Differential includes atypical pneumonias such as viral or fungal, interstitial pneumonias, and respiratory bronchiolitis. COVID-19 pneumonia can have this appearance. 2. Additional chronic/incidental findings detailed above. Aortic Atherosclerosis (ICD10-I70.0). Electronically Signed   By: Franki Cabot M.D.   On: 03/03/2020 12:52   CT ABDOMEN PELVIS W CONTRAST  Result Date: 03/02/2020 CLINICAL DATA:  Abdominal pain with nausea and vomiting. Bowel obstruction suspected. History of bowel obstruction. Patient reports left hip pain. EXAM: CT ABDOMEN AND PELVIS WITH CONTRAST TECHNIQUE: Multidetector CT imaging of the abdomen and pelvis was performed using the standard protocol following bolus administration of intravenous contrast. CONTRAST:  26mL OMNIPAQUE IOHEXOL 300 MG/ML  SOLN COMPARISON:  Remote CT 11/09/2006 FINDINGS: Lower chest: Bilateral lower lobe bronchiectasis and atelectasis. Coronary artery  calcifications. Mild cardiomegaly. Motion artifact limits detailed assessment. Hepatobiliary: Motion artifact through the liver. Punctate parenchymal calcifications typical of prior granulomatous disease. No evidence of focal lesion allowing for motion. Mild gallbladder distention without calcified gallstone or pericholecystic inflammation. There is no biliary dilatation. Pancreas: No ductal dilatation or inflammation. Spleen: Normal in size without focal abnormality. Adrenals/Urinary Tract: No adrenal nodule. Symmetric bilateral perinephric edema. Mild dilatation of the renal pelvis ease and ureters, with mild left ureteral thickening and enhancement. No frank hydronephrosis. 2.7 cm cyst in the mid left kidney. Additional cortical hypodensity in the lower left kidney is too small to characterize. The urinary bladder is diffusely irregular, trabeculated, with irregular wall thickening. Area of low-density in the thickened right bladder dome is nonspecific. There is mild perivesicular fat stranding. Stomach/Bowel: Unremarkable stomach. Normal  positioning of the duodenum and ligament of Treitz. There is no bowel obstruction, administered enteric contrast reaches the colon. No small bowel inflammation or wall thickening. High-riding cecum in the right mid abdomen. Normal terminal ileum. Appendix is not confidently visualized. No evidence of appendicitis. Moderate volume of colonic stool with colonic tortuosity. Mild distal colonic diverticulosis. No diverticulitis. No colonic inflammation. Vascular/Lymphatic: Aortic and branch atherosclerosis. No acute vascular findings. Retroaortic left renal vein. Patent portal vein. No enlarged lymph nodes in the abdomen or pelvis. Reproductive: Possible prostatectomy. Other: Tiny fat containing umbilical hernia. No free air, free fluid, or intra-abdominal fluid collection. Musculoskeletal: Bones are diffusely under mineralized. Multilevel degenerative change throughout the spine.  Fatty atrophy of the right ileus psoas and bilateral gluteal musculature, progressed from prior. No acute osseous abnormalities. IMPRESSION: 1. No bowel obstruction. Moderate volume of colonic stool with colonic tortuosity, suggesting constipation. Mild distal colonic diverticulosis without diverticulitis. 2. Diffusely irregular, trabeculated urinary bladder with irregular wall thickening and mild perivesicular fat stranding. Findings are suspicious for cystitis, patient has listed history of cystectomy, wall thickening may be in part chronic. Area of low-density in the thickened right bladder dome is nonspecific, but may represent focal infection. Mild left ureteral thickening and enhancement suspicious for ascending urinary tract infection. 3. Mild gallbladder distention without calcified gallstone or pericholecystic inflammation. 4. Bilateral lower lobe bronchiectasis and atelectasis in the included lung bases. Aortic Atherosclerosis (ICD10-I70.0). Electronically Signed   By: Keith Rake M.D.   On: 03/02/2020 21:28               LOS: 2 days   Naftuli Dalsanto  [facility]   Pager on [email or web address]. If 7PM-7AM, please contact night-coverage at [email or web address]     03/04/2020, 3:02 PM

## 2020-03-05 DIAGNOSIS — N10 Acute tubulo-interstitial nephritis: Secondary | ICD-10-CM | POA: Diagnosis not present

## 2020-03-05 DIAGNOSIS — E1121 Type 2 diabetes mellitus with diabetic nephropathy: Secondary | ICD-10-CM

## 2020-03-05 DIAGNOSIS — J189 Pneumonia, unspecified organism: Secondary | ICD-10-CM | POA: Diagnosis not present

## 2020-03-05 DIAGNOSIS — I739 Peripheral vascular disease, unspecified: Secondary | ICD-10-CM | POA: Diagnosis not present

## 2020-03-05 DIAGNOSIS — A419 Sepsis, unspecified organism: Secondary | ICD-10-CM | POA: Diagnosis not present

## 2020-03-05 LAB — BASIC METABOLIC PANEL
Anion gap: 9 (ref 5–15)
BUN: 22 mg/dL (ref 8–23)
CO2: 25 mmol/L (ref 22–32)
Calcium: 7.4 mg/dL — ABNORMAL LOW (ref 8.9–10.3)
Chloride: 101 mmol/L (ref 98–111)
Creatinine, Ser: 1.23 mg/dL (ref 0.61–1.24)
GFR, Estimated: 57 mL/min — ABNORMAL LOW (ref >60)
Glucose, Bld: 113 mg/dL — ABNORMAL HIGH (ref 70–99)
Potassium: 3.2 mmol/L — ABNORMAL LOW (ref 3.5–5.1)
Sodium: 135 mmol/L (ref 135–145)

## 2020-03-05 LAB — CULTURE, BLOOD (SINGLE): Special Requests: ADEQUATE

## 2020-03-05 LAB — CBC WITH DIFFERENTIAL/PLATELET
Abs Immature Granulocytes: 0.02 10*3/uL (ref 0.00–0.07)
Basophils Absolute: 0 10*3/uL (ref 0.0–0.1)
Basophils Relative: 0 %
Eosinophils Absolute: 0.2 10*3/uL (ref 0.0–0.5)
Eosinophils Relative: 2 %
HCT: 27.9 % — ABNORMAL LOW (ref 39.0–52.0)
Hemoglobin: 8.9 g/dL — ABNORMAL LOW (ref 13.0–17.0)
Immature Granulocytes: 0 %
Lymphocytes Relative: 32 %
Lymphs Abs: 2.2 10*3/uL (ref 0.7–4.0)
MCH: 29.3 pg (ref 26.0–34.0)
MCHC: 31.9 g/dL (ref 30.0–36.0)
MCV: 91.8 fL (ref 80.0–100.0)
Monocytes Absolute: 0.9 10*3/uL (ref 0.1–1.0)
Monocytes Relative: 13 %
Neutro Abs: 3.7 10*3/uL (ref 1.7–7.7)
Neutrophils Relative %: 53 %
Platelets: 171 10*3/uL (ref 150–400)
RBC: 3.04 MIL/uL — ABNORMAL LOW (ref 4.22–5.81)
RDW: 14 % (ref 11.5–15.5)
WBC: 6.9 10*3/uL (ref 4.0–10.5)
nRBC: 0 % (ref 0.0–0.2)

## 2020-03-05 LAB — GLUCOSE, CAPILLARY
Glucose-Capillary: 122 mg/dL — ABNORMAL HIGH (ref 70–99)
Glucose-Capillary: 121 mg/dL — ABNORMAL HIGH (ref 70–99)
Glucose-Capillary: 119 mg/dL — ABNORMAL HIGH (ref 70–99)
Glucose-Capillary: 197 mg/dL — ABNORMAL HIGH (ref 70–99)

## 2020-03-05 LAB — PHOSPHORUS: Phosphorus: 2.5 mg/dL (ref 2.5–4.6)

## 2020-03-05 LAB — MAGNESIUM: Magnesium: 2 mg/dL (ref 1.7–2.4)

## 2020-03-05 MED ORDER — POTASSIUM CHLORIDE CRYS ER 20 MEQ PO TBCR
40.0000 meq | EXTENDED_RELEASE_TABLET | ORAL | Status: AC
  Administered 2020-03-05 (×2): 40 meq via ORAL
  Filled 2020-03-05 (×2): qty 2

## 2020-03-05 MED ORDER — POTASSIUM CHLORIDE CRYS ER 20 MEQ PO TBCR: 40.0000 meq | EXTENDED_RELEASE_TABLET | Freq: Once | ORAL | Status: DC

## 2020-03-05 NOTE — Progress Notes (Signed)
Progress Note    [name].  PXT:[id number] DOB: 01/04/1931  DOA: 03/02/2020 PCP: Dettinger, Fransisca Kaufmann, MD      Brief Narrative:    Medical records reviewed and are as summarized below:  Bobby Yanko. is a 87 y.o. male       Assessment/Plan:   Principal Problem:   Sepsis ([place]) Active Problems:   Pyelonephritis   Bilateral pneumonia   Type 2 diabetes mellitus with diabetic nephropathy ([place])   Essential hypertension   CAD (coronary artery disease)   Spina bifida ([place])   PVD (peripheral vascular disease) ([place])   Chronic kidney disease, stage 3a ([place])   Acute pyelonephritis   S/P AKA (above knee amputation), right (HCC)   Fever, sepsis secondary to acute left pyelonephritis and community-acquired pneumonia, leukocytosis: Chest x-ray did not show any acute abnormality.  However, CT scan of the chest showed findings concerning for acute bilateral upper lobe pneumonia superimposed on chronic interstitial lung disease/fibrosis. Continue empiric IV antibiotics. MRSA PCR was negative.   Staph epidermidis and staph hominis bacteremia present in 1 out of 4 bottles: These are likely contaminants. Blood culture repeated on 03/03/2020 has not shown any growth thus far. Follow-up blood cultures.  Type II DM with diabetic neuropathy: Humalog as needed for hyperglycemia.  CAD, PVD status post right AKA, spina bifida: Continue simvastatin, aspirin and Plavix.  Hypertension: Continue metoprolol  CKD stage IIIb: Creatinine is stable.  Diarrhea: Improved. .  Stage IV sacral decubitus ulcer, stage I left heel decubitus ulcer: Allergies represents an infection.  Continue local wound care.  Consult wound care nurse.    Overgrown right thumbnail with suspected fungal infection: Recommended outpatient follow-up with dermatologist for finger nail scrapings and further management.    Body mass index is 23.4 kg/m.    Pressure Injury 03/03/20 Foot Anterior;Left (Active)   03/03/20 0400  Location: Foot  Location Orientation: Anterior;Left  Staging:   Wound Description (Comments):   Present on Admission:      Pressure Injury 03/03/20 Sacrum (Active)  03/03/20 0400  Location: Sacrum  Location Orientation:   Staging:   Wound Description (Comments):   Present on Admission:      Pressure Injury 03/03/20 Buttocks (Active)  03/03/20 0400  Location: Buttocks  Location Orientation:   Staging:   Wound Description (Comments):   Present on Admission:           Diet Order            Diet heart healthy/carb modified Room service appropriate? Yes; Fluid consistency: Thin  Diet effective now                      Medications:   . aspirin  81 mg Oral Daily  . buPROPion  150 mg Oral Daily  . clopidogrel  75 mg Oral Daily  . enoxaparin (LOVENOX) injection  40 mg Subcutaneous Q24H  . ezetimibe  10 mg Oral QHS  . insulin aspart  0-15 Units Subcutaneous TID WC  . insulin aspart  0-5 Units Subcutaneous QHS  . melatonin  5 mg Oral QHS  . metoprolol tartrate  12.5 mg Oral BID  . nystatin   Topical BID  . pantoprazole  40 mg Oral Daily  . simvastatin  20 mg Oral QHS   Continuous Infusions: . azithromycin 500 mg (03/04/20 1822)  . cefTRIAXone (ROCEPHIN)  IV 2 g (03/04/20 2100)     Anti-infectives (From admission, onward)  Start     Dose/Rate Route Frequency Ordered Stop   03/04/20 2000  cefTRIAXone (ROCEPHIN) 2 g in sodium chloride 0.9 % 100 mL IVPB        2 g 200 mL/hr over 30 Minutes Intravenous Every 24 hours 03/04/20 0735     03/03/20 1930  cefTRIAXone (ROCEPHIN) 1 g in sodium chloride 0.9 % 100 mL IVPB  Status:  Discontinued        1 g 200 mL/hr over 30 Minutes Intravenous Every 24 hours 03/02/20 2212 03/04/20 0735   03/03/20 1800  azithromycin (ZITHROMAX) 500 mg in sodium chloride 0.9 % 250 mL IVPB        500 mg 250 mL/hr over 60 Minutes Intravenous Every 24 hours 03/03/20 1553     03/02/20 1845  cefTRIAXone (ROCEPHIN) 2 g in  sodium chloride 0.9 % 100 mL IVPB        2 g 200 mL/hr over 30 Minutes Intravenous  Once 03/02/20 1836 03/02/20 1939   03/02/20 1845  metroNIDAZOLE (FLAGYL) IVPB 500 mg        500 mg 100 mL/hr over 60 Minutes Intravenous  Once 03/02/20 1836 03/02/20 2055             Family Communication/Anticipated D/C date and plan/Code Status   DVT prophylaxis: enoxaparin (LOVENOX) injection 40 mg Start: 03/03/20 0600     Code Status: Full Code  Family Communication: None Disposition Plan:    Status is: Inpatient  Remains inpatient appropriate because:IV treatments appropriate due to intensity of illness or inability to take PO   Dispo: The patient is from: SNF              Anticipated d/c is to: SNF              Anticipated d/c date is: 2 days              Patient currently is not medically stable to d/c.           Subjective:   He still has a cough, lower abdominal pain and pain in the left flank.  He is having watery stools.  No vomiting, shortness of breath or chest pain.  Objective:    Vitals:   03/05/20 0425 03/05/20 0807 03/05/20 1153 03/05/20 1556  BP: (!) 118/58 (!) 129/49 137/60 (!) 113/45  Pulse: 62 (!) 57 (!) 59 60  Resp: 18 18 20 20   Temp: 97.7 F (36.5 C) 97.7 F (36.5 C) 97.7 F (36.5 C) 97.6 F (36.4 C)  TempSrc: Oral Oral Oral Oral  SpO2: 99% 94% 100% 95%  Weight:      Height:       No data found.   Intake/Output Summary (Last 24 hours) at 03/05/2020 1607 Last data filed at 03/05/2020 1403 Gross per 24 hour  Intake --  Output 1750 ml  Net -1750 ml   Filed Weights   03/03/20 0400  Weight: 69.8 kg    Exam:    GEN: NAD SKIN: Stage IV sacral decubitus ulcer. Stage I left heel decubitus ulcer EYES: No pallor or icterus ENT: MMM CV: RRR PULM: CTA B ABD: soft, ND, NT, +BS CNS: AAO x 3, non focal EXT: Right AKA. No edema or tenderness. Overgrown right thumb nail with what looks like a fungal infection of the nail. GU: Left CVA  tenderness is improving.        Data Reviewed:   I have personally reviewed following labs and imaging studies:  Labs:  Labs show the following:   Basic Metabolic Panel: Recent Labs  Lab 03/02/20 1414 03/02/20 2219 03/03/20 0423 03/04/20 0347 03/05/20 0431  NA 134*  --  135 134* 135  K 4.7  --  4.9 3.8 3.2*  CL 100  --  103 103 101  CO2 22  --  25 25 25   GLUCOSE 242*  --  147* 100* 113*  BUN 34*  --  37* 27* 22  CREATININE 1.58* 1.54* 1.52* 1.33* 1.23  CALCIUM 8.9  --  8.1* 7.8* 7.4*  MG  --   --   --   --  2.0  PHOS  --   --   --   --  2.5   GFR Estimated Creatinine Clearance: 40.9 mL/min (by C-G formula based on SCr of 1.23 mg/dL). Liver Function Tests: Recent Labs  Lab 03/02/20 1414  AST 14*  ALT 10  ALKPHOS 76  BILITOT 0.9  PROT 6.9  ALBUMIN 3.5   No results for input(s): LIPASE, AMYLASE in the last 168 hours. No results for input(s): AMMONIA in the last 168 hours. Coagulation profile Recent Labs  Lab 03/03/20 0423  INR 1.1    CBC: Recent Labs  Lab 03/02/20 1414 03/02/20 2219 03/03/20 0423 03/04/20 0347 03/05/20 0431  WBC 19.8* 16.7* 14.4* 10.0 6.9  NEUTROABS 16.8*  --   --  6.6 3.7  HGB 11.7* 10.2* 9.4* 9.0* 8.9*  HCT 36.5* 32.0* 29.6* 27.8* 27.9*  MCV 91.5 92.8 92.2 92.1 91.8  PLT 230 200 178 160 171   Cardiac Enzymes: No results for input(s): CKTOTAL, CKMB, CKMBINDEX, TROPONINI in the last 168 hours. BNP (last 3 results) No results for input(s): PROBNP in the last 8760 hours. CBG: Recent Labs  Lab 03/04/20 1128 03/04/20 1705 03/04/20 2100 03/05/20 0804 03/05/20 1150  GLUCAP 146* 169* 149* 122* 121*   D-Dimer: No results for input(s): DDIMER in the last 72 hours. Hgb A1c: Recent Labs    03/02/20 2219  HGBA1C 6.5*   Lipid Profile: No results for input(s): CHOL, HDL, LDLCALC, TRIG, CHOLHDL, LDLDIRECT in the last 72 hours. Thyroid function studies: No results for input(s): TSH, T4TOTAL, T3FREE, THYROIDAB in the last 72  hours.  Invalid input(s): FREET3 Anemia work up: No results for input(s): VITAMINB12, FOLATE, FERRITIN, TIBC, IRON, RETICCTPCT in the last 72 hours. Sepsis Labs: Recent Labs  Lab 03/02/20 1414 03/02/20 2219 03/03/20 0423 03/04/20 0347 03/05/20 0431  PROCALCITON  --   --  1.02  --   --   WBC 19.8* 16.7* 14.4* 10.0 6.9  LATICACIDVEN 1.2  --   --   --   --     Microbiology Recent Results (from the past 240 hour(s))  Culture, blood (single)     Status: Abnormal   Collection Time: 03/02/20  7:16 PM   Specimen: BLOOD  Result Value Ref Range Status   Specimen Description BLOOD BLOOD RIGHT FOREARM  Final   Special Requests   Final    BOTTLES DRAWN AEROBIC AND ANAEROBIC Blood Culture adequate volume   Culture  Setup Time   Final    Organism ID to follow [place] CRITICAL RESULT CALLED TO, READ BACK BY AND VERIFIED WITH: [place] @1455  03/03/20 MJU    Culture (A)  Final    STAPHYLOCOCCUS EPIDERMIDIS STAPHYLOCOCCUS HOMINIS THE SIGNIFICANCE OF ISOLATING THIS ORGANISM FROM A SINGLE SET OF BLOOD CULTURES WHEN MULTIPLE SETS ARE DRAWN IS UNCERTAIN. PLEASE NOTIFY THE MICROBIOLOGY DEPARTMENT WITHIN ONE  WEEK IF SPECIATION AND SENSITIVITIES ARE REQUIRED. ORG 2 CRITICAL RESULT CALLED TO, READ BACK BY AND VERIFIED WITH: Lavone Neri CBULA [phone] FCP    Report Status 03/05/2020 FINAL  Final  Urine Culture     Status: Abnormal   Collection Time: 03/02/20  7:16 PM   Specimen: Urine, Random  Result Value Ref Range Status   Specimen Description   Final    URINE, RANDOM Performed at [facility], [address] [place], [place] [address]    Special Requests   Final    NONE Performed at [facility], [address] [place], [place] [address]    Culture MULTIPLE SPECIES PRESENT, SUGGEST RECOLLECTION (A)  Final   Report Status 03/04/2020 FINAL  Final  Blood Culture ID Panel (Reflexed)     Status: Abnormal   Collection Time:  03/02/20  7:16 PM  Result Value Ref Range Status   Enterococcus faecalis NOT DETECTED NOT DETECTED Final   Enterococcus Faecium NOT DETECTED NOT DETECTED Final   Listeria monocytogenes NOT DETECTED NOT DETECTED Final   Staphylococcus species DETECTED (A) NOT DETECTED Final    Comment: CRITICAL RESULT CALLED TO, READ BACK BY AND VERIFIED WITH: SUSAN WATSON @1455  03/03/20 MJU    Staphylococcus aureus (BCID) NOT DETECTED NOT DETECTED Final   Staphylococcus epidermidis DETECTED (A) NOT DETECTED Final    Comment: Methicillin (oxacillin) resistant coagulase negative staphylococcus. Possible blood culture contaminant (unless isolated from more than one blood culture draw or clinical case suggests pathogenicity). No antibiotic treatment is indicated for blood  culture contaminants. CRITICAL RESULT CALLED TO, READ BACK BY AND VERIFIED WITH: [place] @1455  03/03/20 MJU    Staphylococcus lugdunensis NOT DETECTED NOT DETECTED Final   Streptococcus species NOT DETECTED NOT DETECTED Final   Streptococcus agalactiae NOT DETECTED NOT DETECTED Final   Streptococcus pneumoniae NOT DETECTED NOT DETECTED Final   Streptococcus pyogenes NOT DETECTED NOT DETECTED Final   A.calcoaceticus-baumannii NOT DETECTED NOT DETECTED Final   Bacteroides fragilis NOT DETECTED NOT DETECTED Final   Enterobacterales NOT DETECTED NOT DETECTED Final   Enterobacter cloacae complex NOT DETECTED NOT DETECTED Final   Escherichia coli NOT DETECTED NOT DETECTED Final   Klebsiella aerogenes NOT DETECTED NOT DETECTED Final   Klebsiella oxytoca NOT DETECTED NOT DETECTED Final   Klebsiella pneumoniae NOT DETECTED NOT DETECTED Final   Proteus species NOT DETECTED NOT DETECTED Final   Salmonella species NOT DETECTED NOT DETECTED Final   Serratia marcescens NOT DETECTED NOT DETECTED Final   Haemophilus influenzae NOT DETECTED NOT DETECTED Final   Neisseria meningitidis NOT DETECTED NOT DETECTED Final   Pseudomonas aeruginosa NOT  DETECTED NOT DETECTED Final   Stenotrophomonas maltophilia NOT DETECTED NOT DETECTED Final   Candida albicans NOT DETECTED NOT DETECTED Final   Candida auris NOT DETECTED NOT DETECTED Final   Candida glabrata NOT DETECTED NOT DETECTED Final   Candida krusei NOT DETECTED NOT DETECTED Final   Candida parapsilosis NOT DETECTED NOT DETECTED Final   Candida tropicalis NOT DETECTED NOT DETECTED Final   Cryptococcus neoformans/gattii NOT DETECTED NOT DETECTED Final   Methicillin resistance mecA/C DETECTED (A) NOT DETECTED Final    Comment: CRITICAL RESULT CALLED TO, READ BACK BY AND VERIFIED WITH: SUSAN WATSON @1455  03/03/20 MJU Performed at [facility], [address] [place], [place] [address]   Resp Panel by RT-PCR (Flu A&B, Covid) Nasopharyngeal Swab     Status: None   Collection Time: 03/02/20  9:42 PM   Specimen: Nasopharyngeal Swab;  Nasopharyngeal(NP) swabs in vial transport medium  Result Value Ref Range Status   SARS Coronavirus 2 by RT PCR NEGATIVE NEGATIVE Final    Comment: (NOTE) SARS-CoV-2 target nucleic acids are NOT DETECTED.  The SARS-CoV-2 RNA is generally detectable in upper respiratory specimens during the acute phase of infection. The lowest concentration of SARS-CoV-2 viral copies this assay can detect is 138 copies/mL. A negative result does not preclude SARS-Cov-2 infection and should not be used as the sole basis for treatment or other patient management decisions. A negative result may occur with  improper specimen collection/handling, submission of specimen other than nasopharyngeal swab, presence of viral mutation(s) within the areas targeted by this assay, and inadequate number of viral copies(<138 copies/mL). A negative result must be combined with clinical observations, patient history, and epidemiological information. The expected result is Negative.  Fact Sheet for Patients:  EntrepreneurPulse.com.au  Fact Sheet for Healthcare  Providers:  IncredibleEmployment.be  This test is no t yet approved or cleared by the [place] FDA and  has been authorized for detection and/or diagnosis of SARS-CoV-2 by FDA under an Emergency Use Authorization (EUA). This EUA will remain  in effect (meaning this test can be used) for the duration of the COVID-19 declaration under Section 564(b)(1) of the Act, 21 U.S.C.section 360bbb-3(b)(1), unless the authorization is terminated  or revoked sooner.       Influenza A by PCR NEGATIVE NEGATIVE Final   Influenza B by PCR NEGATIVE NEGATIVE Final    Comment: (NOTE) The Xpert Xpress SARS-CoV-2/FLU/RSV plus assay is intended as an aid in the diagnosis of influenza from Nasopharyngeal swab specimens and should not be used as a sole basis for treatment. Nasal washings and aspirates are unacceptable for Xpert Xpress SARS-CoV-2/FLU/RSV testing.  Fact Sheet for Patients: EntrepreneurPulse.com.au  Fact Sheet for Healthcare Providers: IncredibleEmployment.be  This test is not yet approved or cleared by the [place] FDA and has been authorized for detection and/or diagnosis of SARS-CoV-2 by FDA under an Emergency Use Authorization (EUA). This EUA will remain in effect (meaning this test can be used) for the duration of the COVID-19 declaration under Section 564(b)(1) of the Act, 21 U.S.C. section 360bbb-3(b)(1), unless the authorization is terminated or revoked.  Performed at [facility], [address] [place], [place] [address]   MRSA PCR Screening     Status: None   Collection Time: 03/03/20  7:00 AM   Specimen: Nasal Mucosa; Nasopharyngeal  Result Value Ref Range Status   MRSA by PCR NEGATIVE NEGATIVE Final    Comment:        The GeneXpert MRSA Assay (FDA approved for NASAL specimens only), is one component of a comprehensive MRSA colonization surveillance program. It is not intended to diagnose  MRSA infection nor to guide or monitor treatment for MRSA infections. Performed at [facility], [address] [place], [place] [address]   CULTURE, BLOOD (ROUTINE X 2) w Reflex to ID Panel     Status: None (Preliminary result)   Collection Time: 03/03/20  6:33 PM   Specimen: BLOOD  Result Value Ref Range Status   Specimen Description BLOOD BLOOD RIGHT WRIST  Final   Special Requests   Final    BOTTLES DRAWN AEROBIC AND ANAEROBIC Blood Culture results may not be optimal due to an inadequate volume of blood received in culture bottles   Culture   Final    NO GROWTH 2 DAYS Performed at [facility], [address] [place], [place]  [address]    Report Status PENDING  Incomplete  CULTURE, BLOOD (ROUTINE X 2) w Reflex to ID Panel     Status: None (Preliminary result)   Collection Time: 03/03/20  6:33 PM   Specimen: BLOOD  Result Value Ref Range Status   Specimen Description BLOOD BLOOD RIGHT HAND  Final   Special Requests   Final    BOTTLES DRAWN AEROBIC AND ANAEROBIC Blood Culture adequate volume   Culture   Final    NO GROWTH 2 DAYS Performed at [facility], [address], [place], [place] [address]    Report Status PENDING  Incomplete    Procedures and diagnostic studies:  No results found.             LOS: 3 days   Chou Busler  [facility]   Pager on [email or web address]. If 7PM-7AM, please contact night-coverage at [email or web address]     03/05/2020, 4:07 PM

## 2020-03-05 NOTE — Plan of Care (Signed)
Pt currently on RA. Vitals stable. Safety measures in place. Will continue to monitor.    Problem: Education: Goal: Knowledge of General Education information will improve Description: Including pain rating scale, medication(s)/side effects and non-pharmacologic comfort measures Outcome: Progressing   Problem: Health Behavior/Discharge Planning: Goal: Ability to manage health-related needs will improve Outcome: Progressing   Problem: Clinical Measurements: Goal: Ability to maintain clinical measurements within normal limits will improve Outcome: Progressing Goal: Will remain free from infection Outcome: Progressing Goal: Diagnostic test results will improve Outcome: Progressing Goal: Respiratory complications will improve Outcome: Progressing Goal: Cardiovascular complication will be avoided Outcome: Progressing   Problem: Activity: Goal: Risk for activity intolerance will decrease Outcome: Progressing   Problem: Nutrition: Goal: Adequate nutrition will be maintained Outcome: Progressing   Problem: Coping: Goal: Level of anxiety will decrease Outcome: Progressing   Problem: Elimination: Goal: Will not experience complications related to bowel motility Outcome: Progressing Goal: Will not experience complications related to urinary retention Outcome: Progressing   Problem: Pain Managment: Goal: General experience of comfort will improve Outcome: Progressing   Problem: Safety: Goal: Ability to remain free from injury will improve Outcome: Progressing   Problem: Skin Integrity: Goal: Risk for impaired skin integrity will decrease Outcome: Progressing

## 2020-03-06 DIAGNOSIS — N10 Acute tubulo-interstitial nephritis: Secondary | ICD-10-CM | POA: Diagnosis not present

## 2020-03-06 DIAGNOSIS — I10 Essential (primary) hypertension: Secondary | ICD-10-CM | POA: Diagnosis not present

## 2020-03-06 DIAGNOSIS — A419 Sepsis, unspecified organism: Secondary | ICD-10-CM | POA: Diagnosis not present

## 2020-03-06 DIAGNOSIS — Z89611 Acquired absence of right leg above knee: Secondary | ICD-10-CM

## 2020-03-06 DIAGNOSIS — J189 Pneumonia, unspecified organism: Secondary | ICD-10-CM | POA: Diagnosis not present

## 2020-03-06 LAB — RESP PANEL BY RT-PCR (FLU A&B, COVID) ARPGX2
Influenza A by PCR: NEGATIVE
Influenza B by PCR: NEGATIVE
SARS Coronavirus 2 by RT PCR: NEGATIVE

## 2020-03-06 LAB — CBC WITH DIFFERENTIAL/PLATELET
Abs Immature Granulocytes: 0.06 10*3/uL (ref 0.00–0.07)
Basophils Absolute: 0 10*3/uL (ref 0.0–0.1)
Basophils Relative: 0 %
Eosinophils Absolute: 0.2 10*3/uL (ref 0.0–0.5)
Eosinophils Relative: 3 %
HCT: 31.5 % — ABNORMAL LOW (ref 39.0–52.0)
Hemoglobin: 10.1 g/dL — ABNORMAL LOW (ref 13.0–17.0)
Immature Granulocytes: 1 %
Lymphocytes Relative: 29 %
Lymphs Abs: 2.5 10*3/uL (ref 0.7–4.0)
MCH: 29.4 pg (ref 26.0–34.0)
MCHC: 32.1 g/dL (ref 30.0–36.0)
MCV: 91.8 fL (ref 80.0–100.0)
Monocytes Absolute: 0.9 10*3/uL (ref 0.1–1.0)
Monocytes Relative: 11 %
Neutro Abs: 5 10*3/uL (ref 1.7–7.7)
Neutrophils Relative %: 56 %
Platelets: 203 10*3/uL (ref 150–400)
RBC: 3.43 MIL/uL — ABNORMAL LOW (ref 4.22–5.81)
RDW: 13.9 % (ref 11.5–15.5)
Smear Review: NORMAL
WBC: 8.7 10*3/uL (ref 4.0–10.5)
nRBC: 0 % (ref 0.0–0.2)

## 2020-03-06 LAB — BASIC METABOLIC PANEL
Anion gap: 9 (ref 5–15)
BUN: 16 mg/dL (ref 8–23)
CO2: 24 mmol/L (ref 22–32)
Calcium: 7.9 mg/dL — ABNORMAL LOW (ref 8.9–10.3)
Chloride: 104 mmol/L (ref 98–111)
Creatinine, Ser: 1.02 mg/dL (ref 0.61–1.24)
GFR, Estimated: >60 mL/min (ref >60)
Glucose, Bld: 131 mg/dL — ABNORMAL HIGH (ref 70–99)
Potassium: 3.7 mmol/L (ref 3.5–5.1)
Sodium: 137 mmol/L (ref 135–145)

## 2020-03-06 LAB — GLUCOSE, CAPILLARY
Glucose-Capillary: 124 mg/dL — ABNORMAL HIGH (ref 70–99)
Glucose-Capillary: 179 mg/dL — ABNORMAL HIGH (ref 70–99)
Glucose-Capillary: 116 mg/dL — ABNORMAL HIGH (ref 70–99)

## 2020-03-06 LAB — MAGNESIUM: Magnesium: 1.9 mg/dL (ref 1.7–2.4)

## 2020-03-06 MED ORDER — CEFPODOXIME PROXETIL 200 MG PO TABS: 200.0000 mg | ORAL_TABLET | Freq: Two times a day (BID) | ORAL | 0 refills | Status: AC

## 2020-03-06 MED ORDER — NYSTATIN 100000 UNIT/GM EX POWD: Freq: Two times a day (BID) | CUTANEOUS | 0 refills | Status: AC

## 2020-03-06 NOTE — TOC Initial Note (Incomplete)
Transition of Care (TOC) - Initial/Assessment Note    Patient Details  Name: [name]. MRN: [id number] Date of Birth: August 08, 1930  Transition of Care [facility]) CM/SW Contact:    Beverly Sessions, RN Phone Number: 03/06/2020, 2:51 PM  Clinical Narrative:                  Confirmed with son that patient is from [place]. Has been there for about a year Son wishes for patient to return at discharge  Per Hilda Blades at [facility] patient can return today DC info sent in the [place] [facility] RN to call report EMS packet printed **repeat covid test negative **ems transport called Expected Discharge Plan: [place] Barriers to Discharge: No Barriers Identified   Patient Goals and CMS Choice        Expected Discharge Plan and Services Expected Discharge Plan: [place] arrangements for the past 2 months: [place] Expected Discharge Date: 03/06/20                                    Prior Living Arrangements/Services Living arrangements for the past 2 months: [place] Lives with:: Facility Resident                   Activities of Daily Living Home Assistive Devices/Equipment: Wheelchair (power chair) ADL Screening (condition at time of admission) Patient's cognitive ability adequate to safely complete daily activities?: Yes Is the patient deaf or have difficulty hearing?: Yes Does the patient have difficulty seeing, even when wearing glasses/contacts?: Yes Does the patient have difficulty concentrating, remembering, or making decisions?: No Patient able to express need for assistance with ADLs?: Yes Does the patient have difficulty dressing or bathing?: Yes Independently performs ADLs?: No Communication: Independent Dressing (OT): Needs assistance Is this a change from baseline?: Pre-admission baseline Grooming: Needs assistance Is this a change from baseline?: Pre-admission  baseline Feeding: Dependent Is this a change from baseline?: Pre-admission baseline Bathing: Needs assistance Is this a change from baseline?: Pre-admission baseline Toileting: Dependent Is this a change from baseline?: Pre-admission baseline In/Out Bed: Dependent Is this a change from baseline?: Pre-admission baseline Walks in Home: Dependent Does the patient have difficulty walking or climbing stairs?: Yes Weakness of Legs: Left Weakness of Arms/Hands: Both  Permission Sought/Granted                  Emotional Assessment              Admission diagnosis:  Pyelonephritis [N12] Sepsis without acute organ dysfunction, due to unspecified organism [facility]) [A41.9] Patient Active Problem List   Diagnosis Date Noted  . Bilateral pneumonia 03/03/2020  . CKD (chronic kidney disease), stage III ([place]) 03/02/2020  . Acute pyelonephritis 03/02/2020  . S/P AKA (above knee amputation), right ([place]) 03/02/2020  . Sepsis ([place]) 03/02/2020  . Pyelonephritis 03/02/2020  . PVD (peripheral vascular disease) ([place]) 03/25/2019  . Osteomyelitis of right foot ([place]) 03/25/2019  . Frequency of urination and polyuria 03/25/2019  . Atherosclerosis of native arteries of the extremities with ulceration ([place]) 03/14/2019  . COVID-19 virus infection 03/14/2019  . Sacral pressure sore 11/25/2018  . Arterial hemorrhage 11/18/2018  . Hypoglycemia associated with type 2 diabetes mellitus ([place]) 11/18/2018  . General weakness 09/22/2016  . Bilateral carpal tunnel syndrome 09/22/2016  . Leukocytosis 07/30/2015  .  Urinary incontinence 12/13/2012  . Chronic constipation 11/29/2012  . Metabolic syndrome 93/24/1991  . Spina bifida ([place]) 11/18/2012  . CAD (coronary artery disease) 03/08/2009  . Type 2 diabetes mellitus with diabetic nephropathy ([place]) 10/29/2008  . Type 2 diabetes mellitus with stage 3 chronic kidney disease, without long-term current use of insulin ([place]) 10/29/2008  . PTSD 10/29/2008  .  Essential hypertension 10/29/2008   PCP:  Dettinger, Fransisca Kaufmann, MD Pharmacy:   [facility], [place] [address] [place] [address] Phone: [phone] Fax: [phone]  [facility] [place], [place] [address] [place] [address] Phone: [phone] Fax: [place], [place] [address] [place] [address] Phone: [phone] Fax: [phone]     Social Determinants of Health (SDOH) Interventions    Readmission Risk Interventions No flowsheet data found.

## 2020-03-06 NOTE — Care Management Important Message (Signed)
Important Message  Patient Details  Name: [name]. MRN: [id number] Date of Birth: 1931-02-20   Medicare Important Message Given:  Yes     Dannette Barbara 03/06/2020, 10:56 AM

## 2020-03-06 NOTE — Plan of Care (Signed)

## 2020-03-06 NOTE — Treatment Plan (Addendum)
Report called to Caryl Pina, LPN at [facility] at this time. While calling report, was told by facility and asked to break the news that the patients room mate died while he was admitted. Called chaplain services at this time.

## 2020-03-06 NOTE — Care Plan (Signed)
Pt off unit in stable condition.

## 2020-03-06 NOTE — Progress Notes (Addendum)
[facility] received PG from nurse Joelene Millin to accompany her as she relayed to patient his roommate had passed away since he has been in hospital. I took this request. Nurse was at bedside upon arrival. This gentleman was the most pleasant, he shared so much in conversation with him talking about his, faith, his wife who passed away, his son, his conversion experience. He shared his extremely strong faith and how that has proven to be his best decision in life. There were two significant times [name] became emotional; when we told him of his friend and room mate's passing and as he remembered his wife. This was such a pleasant visit. This joint visit with his amazing nurse Joelene Millin, I believe will help him tremendously as today is his discharge back to the nursing facility.

## 2020-03-06 NOTE — Discharge Summary (Addendum)
Physician Discharge Summary  [name]. HUD:[id number] DOB: 1930-08-02 DOA: 03/02/2020  PCP: Dettinger, Fransisca Kaufmann, MD  Admit date: 03/02/2020 Discharge date: 03/06/2020  Discharge disposition: Skilled nursing facility   Recommendations for Outpatient Follow-Up:   Follow up with dermatologist to evaluate overgrown right thumb nail with suspected fungal infection.   Discharge Diagnosis:   Principal Problem:   Sepsis ([place]) Active Problems:   Pyelonephritis   Bilateral pneumonia   Type 2 diabetes mellitus with diabetic nephropathy (HCC)   Essential hypertension   CAD (coronary artery disease)   Spina bifida ([place])   PVD (peripheral vascular disease) ([place])   CKD (chronic kidney disease), stage III (HCC)   Acute pyelonephritis   S/P AKA (above knee amputation), right ([place])    Discharge Condition: Stable.  Diet recommendation:  Diet Order            Diet - low sodium heart healthy           Diet heart healthy/carb modified Room service appropriate? Yes; Fluid consistency: Thin  Diet effective now                   Code Status: Full Code     Hospital Course:   [name] is an 87 year old man with medical history significant for spina bifida, PVD status post right AKA, wheelchair-bound, type II DM, CKD stage IIIb, CAD, stage IV sacral decubitus ulcer.  He was brought to the hospital because of fever, shortness of breath and cough.  He also complained of vomiting and constipation.  He had tested negative for COVID 19 infection prior to admission.  He was febrile and tachycardic in the emergency room and he had leukocytosis of 20,000.  Urinalysis was suggestive of infection and because of complaints of left flank pain, patient was thought to have acute left pyelonephritis.  Chest x-ray was unremarkable but CT chest showed findings concerning for bilateral upper lobe pneumonia.  He was septic on admission.  He was treated with empiric IV antibiotics.  His  condition has improved.  Urine culture showed multiple species.  It is not clear whether patient truly had a urinary tract infection or not.  He said left flank pain resolved after he was able to move his bowels and he attributed it to constipation.  1 out of 4 blood culture bottles showed staph hominis and staph epidermidis.  These bacteria were thought to be contaminants.  Repeat blood culture did not show any growth.  He has CKD and GFR has been fluctuating.  I suspect he has CKD stage IIIb.  His condition has improved and he is stable stable for discharge to SNF today.  He has overgrown right thumbnail with suspected fungal infection.  Outpatient follow-up with dermatologist is recommended for fingernail scrapings and further management.     Discharge Exam:    Vitals:   03/05/20 2307 03/06/20 0210 03/06/20 0800 03/06/20 1136  BP: (!) 141/70 138/64 (!) 141/57 (!) 129/54  Pulse: 65 65 74 (!) 58  Resp: 16 18 20 14   Temp: (!) 97.5 F (36.4 C) 97.6 F (36.4 C) 99 F (37.2 C) 97.9 F (36.6 C)  TempSrc: Oral Oral Oral Oral  SpO2: 100% 99% 95% 98%  Weight:      Height:         GEN: NAD SKIN: Stage IV sacral decubitus ulcer.  Stage I left heel decubitus ulcer EYES: No pallor or icterus ENT: MMM CV: RRR PULM: CTA B ABD:  soft, ND, NT, +BS CNS: AAO x 3, non focal EXT: No edema or tenderness.  Right AKA.  Overgrown right thumbnail with what looks like a fungal infection of the nail. GU: No CVA tenderness   The results of significant diagnostics from this hospitalization (including imaging, microbiology, ancillary and laboratory) are listed below for reference.     Procedures and Diagnostic Studies:   DG Chest 2 View  Result Date: 03/02/2020 CLINICAL DATA:  Dry cough for 10 days.  Short of breath. EXAM: CHEST - 2 VIEW COMPARISON:  11/11/2018 FINDINGS: Stable changes from prior cardiac surgery. Cardiac silhouette is normal in size. No mediastinal or hilar masses. No evidence of  adenopathy. Clear lungs.  No pleural effusion or pneumothorax. Skeletal structures are intact. IMPRESSION: No acute cardiopulmonary disease. Electronically Signed   By: Lajean Manes M.D.   On: 03/02/2020 15:14   CT CHEST WO CONTRAST  Result Date: 03/03/2020 CLINICAL DATA:  Worsening cough. Pneumonia, effusion or abscess suspected. EXAM: CT CHEST WITHOUT CONTRAST TECHNIQUE: Multidetector CT imaging of the chest was performed following the standard protocol without IV contrast. COMPARISON:  Chest CT dated 11/02/2006. FINDINGS: Cardiovascular: Aortic atherosclerosis. No thoracic aortic aneurysm. No pericardial effusion. Extensive coronary artery calcifications. Status post surgical changes of CABG. Mediastinum/Nodes: No mass or enlarged lymph nodes are seen within the mediastinum. Esophagus is unremarkable. Trachea is unremarkable. Lungs/Pleura: There is bilateral peripheral interlobular septal thickening, basilar predominant, likely chronic, with associated traction bronchiectasis suggesting chronic interstitial lung disease, most likely NSIP (nonspecific interstitial pneumonia) versus chronic hypersensitivity pneumonitis based on appearance. Associated chronic scarring/pleural thickening at the lung bases. Subtle ground-glass opacities within the LEFT upper lobe and RIGHT upper lobe, suspicious for superimposed pneumonia. No pleural effusion or pneumothorax. Upper Abdomen: Limited images of the upper abdomen are unremarkable. Musculoskeletal: Degenerative spondylosis of the slightly kyphotic thoracic spine, mild to moderate in degree. No acute appearing osseous abnormality. IMPRESSION: 1. Subtle ground-glass opacities within the LEFT upper lobe and RIGHT upper lobe, suspicious for pneumonia superimposed on chronic interstitial lung disease/fibrosis. Differential includes atypical pneumonias such as viral or fungal, interstitial pneumonias, and respiratory bronchiolitis. COVID-19 pneumonia can have this  appearance. 2. Additional chronic/incidental findings detailed above. Aortic Atherosclerosis (ICD10-I70.0). Electronically Signed   By: Franki Cabot M.D.   On: 03/03/2020 12:52   CT ABDOMEN PELVIS W CONTRAST  Result Date: 03/02/2020 CLINICAL DATA:  Abdominal pain with nausea and vomiting. Bowel obstruction suspected. History of bowel obstruction. Patient reports left hip pain. EXAM: CT ABDOMEN AND PELVIS WITH CONTRAST TECHNIQUE: Multidetector CT imaging of the abdomen and pelvis was performed using the standard protocol following bolus administration of intravenous contrast. CONTRAST:  88mL OMNIPAQUE IOHEXOL 300 MG/ML  SOLN COMPARISON:  Remote CT 11/09/2006 FINDINGS: Lower chest: Bilateral lower lobe bronchiectasis and atelectasis. Coronary artery calcifications. Mild cardiomegaly. Motion artifact limits detailed assessment. Hepatobiliary: Motion artifact through the liver. Punctate parenchymal calcifications typical of prior granulomatous disease. No evidence of focal lesion allowing for motion. Mild gallbladder distention without calcified gallstone or pericholecystic inflammation. There is no biliary dilatation. Pancreas: No ductal dilatation or inflammation. Spleen: Normal in size without focal abnormality. Adrenals/Urinary Tract: No adrenal nodule. Symmetric bilateral perinephric edema. Mild dilatation of the renal pelvis ease and ureters, with mild left ureteral thickening and enhancement. No frank hydronephrosis. 2.7 cm cyst in the mid left kidney. Additional cortical hypodensity in the lower left kidney is too small to characterize. The urinary bladder is diffusely irregular, trabeculated, with irregular wall thickening. Area of low-density in  the thickened right bladder dome is nonspecific. There is mild perivesicular fat stranding. Stomach/Bowel: Unremarkable stomach. Normal positioning of the duodenum and ligament of Treitz. There is no bowel obstruction, administered enteric contrast reaches the  colon. No small bowel inflammation or wall thickening. High-riding cecum in the right mid abdomen. Normal terminal ileum. Appendix is not confidently visualized. No evidence of appendicitis. Moderate volume of colonic stool with colonic tortuosity. Mild distal colonic diverticulosis. No diverticulitis. No colonic inflammation. Vascular/Lymphatic: Aortic and branch atherosclerosis. No acute vascular findings. Retroaortic left renal vein. Patent portal vein. No enlarged lymph nodes in the abdomen or pelvis. Reproductive: Possible prostatectomy. Other: Tiny fat containing umbilical hernia. No free air, free fluid, or intra-abdominal fluid collection. Musculoskeletal: Bones are diffusely under mineralized. Multilevel degenerative change throughout the spine. Fatty atrophy of the right ileus psoas and bilateral gluteal musculature, progressed from prior. No acute osseous abnormalities. IMPRESSION: 1. No bowel obstruction. Moderate volume of colonic stool with colonic tortuosity, suggesting constipation. Mild distal colonic diverticulosis without diverticulitis. 2. Diffusely irregular, trabeculated urinary bladder with irregular wall thickening and mild perivesicular fat stranding. Findings are suspicious for cystitis, patient has listed history of cystectomy, wall thickening may be in part chronic. Area of low-density in the thickened right bladder dome is nonspecific, but may represent focal infection. Mild left ureteral thickening and enhancement suspicious for ascending urinary tract infection. 3. Mild gallbladder distention without calcified gallstone or pericholecystic inflammation. 4. Bilateral lower lobe bronchiectasis and atelectasis in the included lung bases. Aortic Atherosclerosis (ICD10-I70.0). Electronically Signed   By: Keith Rake M.D.   On: 03/02/2020 21:28     Labs:   Basic Metabolic Panel: Recent Labs  Lab 03/02/20 1414 03/02/20 2219 03/03/20 0423 03/04/20 0347 03/05/20 0431  03/06/20 0607  NA 134*  --  135 134* 135 137  K 4.7  --  4.9 3.8 3.2* 3.7  CL 100  --  103 103 101 104  CO2 22  --  25 25 25 24   GLUCOSE 242*  --  147* 100* 113* 131*  BUN 34*  --  37* 27* 22 16  CREATININE 1.58* 1.54* 1.52* 1.33* 1.23 1.02  CALCIUM 8.9  --  8.1* 7.8* 7.4* 7.9*  MG  --   --   --   --  2.0 1.9  PHOS  --   --   --   --  2.5  --    GFR Estimated Creatinine Clearance: 49.4 mL/min (by C-G formula based on SCr of 1.02 mg/dL). Liver Function Tests: Recent Labs  Lab 03/02/20 1414  AST 14*  ALT 10  ALKPHOS 76  BILITOT 0.9  PROT 6.9  ALBUMIN 3.5   No results for input(s): LIPASE, AMYLASE in the last 168 hours. No results for input(s): AMMONIA in the last 168 hours. Coagulation profile Recent Labs  Lab 03/03/20 0423  INR 1.1    CBC: Recent Labs  Lab 03/02/20 1414 03/02/20 2219 03/03/20 0423 03/04/20 0347 03/05/20 0431 03/06/20 0607  WBC 19.8* 16.7* 14.4* 10.0 6.9 8.7  NEUTROABS 16.8*  --   --  6.6 3.7 5.0  HGB 11.7* 10.2* 9.4* 9.0* 8.9* 10.1*  HCT 36.5* 32.0* 29.6* 27.8* 27.9* 31.5*  MCV 91.5 92.8 92.2 92.1 91.8 91.8  PLT 230 200 178 160 171 203   Cardiac Enzymes: No results for input(s): CKTOTAL, CKMB, CKMBINDEX, TROPONINI in the last 168 hours. BNP: Invalid input(s): POCBNP CBG: Recent Labs  Lab 03/05/20 1150 03/05/20 1642 03/05/20 2048 03/06/20 0807 03/06/20 1132  GLUCAP 121* 119* 197* 124* 179*   D-Dimer No results for input(s): DDIMER in the last 72 hours. Hgb A1c No results for input(s): HGBA1C in the last 72 hours. Lipid Profile No results for input(s): CHOL, HDL, LDLCALC, TRIG, CHOLHDL, LDLDIRECT in the last 72 hours. Thyroid function studies No results for input(s): TSH, T4TOTAL, T3FREE, THYROIDAB in the last 72 hours.  Invalid input(s): FREET3 Anemia work up No results for input(s): VITAMINB12, FOLATE, FERRITIN, TIBC, IRON, RETICCTPCT in the last 72 hours. Microbiology Recent Results (from the past 240 hour(s))  Culture,  blood (single)     Status: Abnormal   Collection Time: 03/02/20  7:16 PM   Specimen: BLOOD  Result Value Ref Range Status   Specimen Description BLOOD BLOOD RIGHT FOREARM  Final   Special Requests   Final    BOTTLES DRAWN AEROBIC AND ANAEROBIC Blood Culture adequate volume   Culture  Setup Time   Final    Organism ID to follow GRAM POSITIVE COCCI ANAEROBIC BOTTLE ONLY CRITICAL RESULT CALLED TO, READ BACK BY AND VERIFIED WITH: [place] @1455  03/03/20 MJU    Culture (A)  Final    STAPHYLOCOCCUS EPIDERMIDIS STAPHYLOCOCCUS HOMINIS THE SIGNIFICANCE OF ISOLATING THIS ORGANISM FROM A SINGLE SET OF BLOOD CULTURES WHEN MULTIPLE SETS ARE DRAWN IS UNCERTAIN. PLEASE NOTIFY THE MICROBIOLOGY DEPARTMENT WITHIN ONE WEEK IF SPECIATION AND SENSITIVITIES ARE REQUIRED. ORG 2 CRITICAL RESULT CALLED TO, READ BACK BY AND VERIFIED WITH: Lavone Neri [id number] FCP    Report Status 03/05/2020 FINAL  Final  Urine Culture     Status: Abnormal   Collection Time: 03/02/20  7:16 PM   Specimen: Urine, Random  Result Value Ref Range Status   Specimen Description   Final    URINE, RANDOM Performed at [facility], [address], [place], [place] [address]    Special Requests   Final    NONE Performed at [facility], [address] [place], [place] [address]    Culture MULTIPLE SPECIES PRESENT, SUGGEST RECOLLECTION (A)  Final   Report Status 03/04/2020 FINAL  Final  Blood Culture ID Panel (Reflexed)     Status: Abnormal   Collection Time: 03/02/20  7:16 PM  Result Value Ref Range Status   Enterococcus faecalis NOT DETECTED NOT DETECTED Final   Enterococcus Faecium NOT DETECTED NOT DETECTED Final   Listeria monocytogenes NOT DETECTED NOT DETECTED Final   Staphylococcus species DETECTED (A) NOT DETECTED Final    Comment: CRITICAL RESULT CALLED TO, READ BACK BY AND VERIFIED WITH: SUSAN WATSON @1455  03/03/20 MJU    Staphylococcus aureus (BCID) NOT DETECTED NOT DETECTED Final    Staphylococcus epidermidis DETECTED (A) NOT DETECTED Final    Comment: Methicillin (oxacillin) resistant coagulase negative staphylococcus. Possible blood culture contaminant (unless isolated from more than one blood culture draw or clinical case suggests pathogenicity). No antibiotic treatment is indicated for blood  culture contaminants. CRITICAL RESULT CALLED TO, READ BACK BY AND VERIFIED WITH: SUSAN WATSON @1455  03/03/20 MJU    Staphylococcus lugdunensis NOT DETECTED NOT DETECTED Final   Streptococcus species NOT DETECTED NOT DETECTED Final   Streptococcus agalactiae NOT DETECTED NOT DETECTED Final   Streptococcus pneumoniae NOT DETECTED NOT DETECTED Final   Streptococcus pyogenes NOT DETECTED NOT DETECTED Final   A.calcoaceticus-baumannii NOT DETECTED NOT DETECTED Final   Bacteroides fragilis NOT DETECTED NOT DETECTED Final   Enterobacterales NOT DETECTED NOT DETECTED Final   Enterobacter cloacae complex NOT DETECTED NOT DETECTED Final   Escherichia coli  NOT DETECTED NOT DETECTED Final   Klebsiella aerogenes NOT DETECTED NOT DETECTED Final   Klebsiella oxytoca NOT DETECTED NOT DETECTED Final   Klebsiella pneumoniae NOT DETECTED NOT DETECTED Final   Proteus species NOT DETECTED NOT DETECTED Final   Salmonella species NOT DETECTED NOT DETECTED Final   Serratia marcescens NOT DETECTED NOT DETECTED Final   Haemophilus influenzae NOT DETECTED NOT DETECTED Final   Neisseria meningitidis NOT DETECTED NOT DETECTED Final   Pseudomonas aeruginosa NOT DETECTED NOT DETECTED Final   Stenotrophomonas maltophilia NOT DETECTED NOT DETECTED Final   Candida albicans NOT DETECTED NOT DETECTED Final   Candida auris NOT DETECTED NOT DETECTED Final   Candida glabrata NOT DETECTED NOT DETECTED Final   Candida krusei NOT DETECTED NOT DETECTED Final   Candida parapsilosis NOT DETECTED NOT DETECTED Final   Candida tropicalis NOT DETECTED NOT DETECTED Final   Cryptococcus neoformans/gattii NOT DETECTED NOT  DETECTED Final   Methicillin resistance mecA/C DETECTED (A) NOT DETECTED Final    Comment: CRITICAL RESULT CALLED TO, READ BACK BY AND VERIFIED WITH: SUSAN WATSON @1455  03/03/20 MJU Performed at [facility], [address] [place], [place] [address]   Resp Panel by RT-PCR (Flu A&B, Covid) Nasopharyngeal Swab     Status: None   Collection Time: 03/02/20  9:42 PM   Specimen: Nasopharyngeal Swab; Nasopharyngeal(NP) swabs in vial transport medium  Result Value Ref Range Status   SARS Coronavirus 2 by RT PCR NEGATIVE NEGATIVE Final    Comment: (NOTE) SARS-CoV-2 target nucleic acids are NOT DETECTED.  The SARS-CoV-2 RNA is generally detectable in upper respiratory specimens during the acute phase of infection. The lowest concentration of SARS-CoV-2 viral copies this assay can detect is 138 copies/mL. A negative result does not preclude SARS-Cov-2 infection and should not be used as the sole basis for treatment or other patient management decisions. A negative result may occur with  improper specimen collection/handling, submission of specimen other than nasopharyngeal swab, presence of viral mutation(s) within the areas targeted by this assay, and inadequate number of viral copies(<138 copies/mL). A negative result must be combined with clinical observations, patient history, and epidemiological information. The expected result is Negative.  Fact Sheet for Patients:  EntrepreneurPulse.com.au  Fact Sheet for Healthcare Providers:  IncredibleEmployment.be  This test is no t yet approved or cleared by the [place] FDA and  has been authorized for detection and/or diagnosis of SARS-CoV-2 by FDA under an Emergency Use Authorization (EUA). This EUA will remain  in effect (meaning this test can be used) for the duration of the COVID-19 declaration under Section 564(b)(1) of the Act, 21 U.S.C.section 360bbb-3(b)(1), unless the authorization is  terminated  or revoked sooner.       Influenza A by PCR NEGATIVE NEGATIVE Final   Influenza B by PCR NEGATIVE NEGATIVE Final    Comment: (NOTE) The Xpert Xpress SARS-CoV-2/FLU/RSV plus assay is intended as an aid in the diagnosis of influenza from Nasopharyngeal swab specimens and should not be used as a sole basis for treatment. Nasal washings and aspirates are unacceptable for Xpert Xpress SARS-CoV-2/FLU/RSV testing.  Fact Sheet for Patients: EntrepreneurPulse.com.au  Fact Sheet for Healthcare Providers: IncredibleEmployment.be  This test is not yet approved or cleared by the [place] FDA and has been authorized for detection and/or diagnosis of SARS-CoV-2 by FDA under an Emergency Use Authorization (EUA). This EUA will remain in effect (meaning this test can be used) for the duration of the COVID-19 declaration under Section 564(b)(1) of the  Act, 21 U.S.C. section 360bbb-3(b)(1), unless the authorization is terminated or revoked.  Performed at [facility], [address] [place], [place] [address]   MRSA PCR Screening     Status: None   Collection Time: 03/03/20  7:00 AM   Specimen: Nasal Mucosa; Nasopharyngeal  Result Value Ref Range Status   MRSA by PCR NEGATIVE NEGATIVE Final    Comment:        The GeneXpert MRSA Assay (FDA approved for NASAL specimens only), is one component of a comprehensive MRSA colonization surveillance program. It is not intended to diagnose MRSA infection nor to guide or monitor treatment for MRSA infections. Performed at [facility], [address] [place], [place] [address]   CULTURE, BLOOD (ROUTINE X 2) w Reflex to ID Panel     Status: None (Preliminary result)   Collection Time: 03/03/20  6:33 PM   Specimen: BLOOD  Result Value Ref Range Status   Specimen Description BLOOD BLOOD RIGHT WRIST  Final   Special Requests   Final    BOTTLES DRAWN AEROBIC AND ANAEROBIC  Blood Culture results may not be optimal due to an inadequate volume of blood received in culture bottles   Culture   Final    NO GROWTH 3 DAYS Performed at [facility], [address], [place], [place] [address]    Report Status PENDING  Incomplete  CULTURE, BLOOD (ROUTINE X 2) w Reflex to ID Panel     Status: None (Preliminary result)   Collection Time: 03/03/20  6:33 PM   Specimen: BLOOD  Result Value Ref Range Status   Specimen Description BLOOD BLOOD RIGHT HAND  Final   Special Requests   Final    BOTTLES DRAWN AEROBIC AND ANAEROBIC Blood Culture adequate volume   Culture   Final    NO GROWTH 3 DAYS Performed at [facility], [address] [place], [place] [address]    Report Status PENDING  Incomplete     Discharge Instructions:   Discharge Instructions    Diet - low sodium heart healthy   Complete by: As directed    Discharge wound care:   Complete by: As directed    Cleanse with soap and water, rinse and pat gently dry. Cover with folded piece of xeroform gauze Kellie Simmering # 294), top with dry gauze. Cover with silicone foam. Change xeroform daily. May reuse silicone foam and change every 3 days and PRN soiling or rolling of dressing edges. Encourage turning and repositioning from side to side and minimizing time in the supine position.  Frequent turning is strongly recommended.  A pressure redistribution heel boot for the left foot is recommended.   Follow-up with physician at the nursing home within 3 days of discharge   Increase activity slowly   Complete by: As directed      Allergies as of 03/06/2020      Reactions   Sulfonamide Derivatives    REACTION: pruitis patient cant remember its been so long    Aspirin Rash   In high doses   Nitrofurantoin Rash   Penicillins Rash   Did it involve swelling of the face/tongue/throat, SOB, or low BP? No Did it involve sudden or severe rash/hives, skin peeling, or any reaction on the inside of your  mouth or nose? No Did you need to seek medical attention at a hospital or doctor's office? No When did it last happen?Teenager If all above answers are "NO", may proceed with cephalosporin use.  Sulfa Antibiotics Rash      Medication List    STOP taking these medications   Co Q-10 100 MG Caps   Dalvance 500 MG Solr Generic drug: dalbavancin   ezetimibe-simvastatin 10-20 MG tablet Commonly known as: VYTORIN   glipiZIDE 10 MG tablet Commonly known as: GLUCOTROL   guaiFENesin 600 MG 12 hr tablet Commonly known as: Mucinex   nitroGLYCERIN 0.4 MG SL tablet Commonly known as: NITROSTAT   NON FORMULARY   nystatin cream Commonly known as: MYCOSTATIN Replaced by: nystatin powder   oxyCODONE-acetaminophen 5-325 MG tablet Commonly known as: Percocet   polyethylene glycol powder 17 GM/SCOOP powder Commonly known as: GLYCOLAX/MIRALAX   sodium chloride 0.65 % Soln nasal spray Commonly known as: OCEAN     TAKE these medications   12 Hour Nasal Decongestant 0.05 % nasal spray Generic drug: oxymetazoline Place 2 sprays into both nostrils 2 (two) times daily. For 3 days   Artificial Tears 0.2-0.2-1 % Soln Generic drug: Glycerin-Hypromellose-PEG 400 Apply to eye 3 (three) times daily. Instill 2 drops into each eye 3 times daily.   aspirin 81 MG tablet Take 81 mg by mouth daily.   buPROPion 150 MG 24 hr tablet Commonly known as: WELLBUTRIN XL Take 150 mg by mouth daily.   cefpodoxime 200 MG tablet Commonly known as: VANTIN Take 1 tablet (200 mg total) by mouth 2 (two) times daily for 3 days.   clopidogrel 75 MG tablet Commonly known as: Plavix Take 1 tablet (75 mg total) by mouth daily.   docusate sodium 100 MG capsule Commonly known as: COLACE Take 100 mg by mouth at bedtime.   ezetimibe 10 MG tablet Commonly known as: ZETIA Take 10 mg by mouth at bedtime.   Generlac 10 GM/15ML Soln Generic drug: lactulose (encephalopathy) Take 30 mLs by mouth every  Monday, Wednesday, and Friday.   Iodoflex 0.9 % Pads Generic drug: Cadexomer Iodine Apply 1 application topically See admin instructions. Twice a week heel   Janumet 50-500 MG tablet Generic drug: sitaGLIPtin-metformin Take 1 tablet by mouth daily. with food   melatonin 5 MG Tabs Take 5 mg by mouth at bedtime.   metoprolol tartrate 25 MG tablet Commonly known as: LOPRESSOR Take 0.5 tablets (12.5 mg total) by mouth 2 (two) times daily.   Mucinex DM 30-600 MG Tb12 Take 1 tablet by mouth 2 (two) times daily. For 5 days   nystatin powder Commonly known as: MYCOSTATIN/NYSTOP Apply topically 2 (two) times daily. Apply to groin and perineum twice a day Replaces: nystatin cream   omeprazole 20 MG capsule Commonly known as: PRILOSEC Take 20 mg by mouth daily.   PRESERVISION AREDS 2 PO Take 1 capsule by mouth 2 (two) times daily.   senna 8.6 MG tablet Commonly known as: SENOKOT Take 2 tablets by mouth 2 (two) times daily.   simvastatin 20 MG tablet Commonly known as: ZOCOR Take 20 mg by mouth at bedtime.            Discharge Care Instructions  (From admission, onward)         Start     Ordered   03/06/20 0000  Discharge wound care:       Comments: Cleanse with soap and water, rinse and pat gently dry. Cover with folded piece of xeroform gauze Kellie Simmering # [id number]), top with dry gauze. Cover with silicone foam. Change xeroform daily. May reuse silicone foam and change every 3 days and PRN soiling or rolling of dressing edges. Encourage turning  and repositioning from side to side and minimizing time in the supine position.  Frequent turning is strongly recommended.  A pressure redistribution heel boot for the left foot is recommended.   Follow-up with physician at the nursing home within 3 days of discharge   03/06/20 1156          Follow-up Information    Dermatology, [place]. Schedule an appointment as soon as possible for a visit in 1 week(s).   Contact  information: [place] [address] [phone]                Time coordinating discharge: 34 minutes  Signed:  Jennye Boroughs  [facility] 03/06/2020, 12:01 PM   Pager on [email or web address]. If 7PM-7AM, please contact night-coverage at [email or web address]

## 2020-03-07 DIAGNOSIS — J189 Pneumonia, unspecified organism: Secondary | ICD-10-CM | POA: Diagnosis not present

## 2020-03-07 DIAGNOSIS — N10 Acute tubulo-interstitial nephritis: Secondary | ICD-10-CM | POA: Diagnosis not present

## 2020-03-08 LAB — CULTURE, BLOOD (ROUTINE X 2)
Culture: NO GROWTH
Culture: NO GROWTH
Special Requests: ADEQUATE

## 2020-03-13 DIAGNOSIS — L89154 Pressure ulcer of sacral region, stage 4: Secondary | ICD-10-CM | POA: Diagnosis not present

## 2020-03-13 DIAGNOSIS — K592 Neurogenic bowel, not elsewhere classified: Secondary | ICD-10-CM | POA: Diagnosis not present

## 2020-03-13 DIAGNOSIS — J189 Pneumonia, unspecified organism: Secondary | ICD-10-CM | POA: Diagnosis not present

## 2020-03-13 DIAGNOSIS — E118 Type 2 diabetes mellitus with unspecified complications: Secondary | ICD-10-CM | POA: Diagnosis not present

## 2020-03-13 DIAGNOSIS — Z89611 Acquired absence of right leg above knee: Secondary | ICD-10-CM | POA: Diagnosis not present

## 2020-03-13 DIAGNOSIS — N1832 Chronic kidney disease, stage 3b: Secondary | ICD-10-CM | POA: Diagnosis not present

## 2020-03-13 DIAGNOSIS — L89621 Pressure ulcer of left heel, stage 1: Secondary | ICD-10-CM | POA: Diagnosis not present

## 2020-03-13 DIAGNOSIS — M6281 Muscle weakness (generalized): Secondary | ICD-10-CM | POA: Diagnosis not present

## 2020-03-26 ENCOUNTER — Telehealth: Payer: Self-pay

## 2020-03-26 NOTE — Telephone Encounter (Signed)
Faxed shot record

## 2020-04-30 DIAGNOSIS — R112 Nausea with vomiting, unspecified: Secondary | ICD-10-CM | POA: Diagnosis not present

## 2020-04-30 DIAGNOSIS — K5904 Chronic idiopathic constipation: Secondary | ICD-10-CM | POA: Diagnosis not present

## 2020-04-30 DIAGNOSIS — R1084 Generalized abdominal pain: Secondary | ICD-10-CM | POA: Diagnosis not present

## 2020-08-10 DIAGNOSIS — N1832 Chronic kidney disease, stage 3b: Secondary | ICD-10-CM | POA: Diagnosis not present

## 2020-08-10 DIAGNOSIS — K5904 Chronic idiopathic constipation: Secondary | ICD-10-CM | POA: Diagnosis not present

## 2020-08-10 DIAGNOSIS — E118 Type 2 diabetes mellitus with unspecified complications: Secondary | ICD-10-CM | POA: Diagnosis not present

## 2020-08-10 DIAGNOSIS — K219 Gastro-esophageal reflux disease without esophagitis: Secondary | ICD-10-CM | POA: Diagnosis not present

## 2020-08-20 DIAGNOSIS — Z89611 Acquired absence of right leg above knee: Secondary | ICD-10-CM | POA: Diagnosis not present

## 2020-08-20 DIAGNOSIS — N1832 Chronic kidney disease, stage 3b: Secondary | ICD-10-CM | POA: Diagnosis not present

## 2020-08-20 DIAGNOSIS — E118 Type 2 diabetes mellitus with unspecified complications: Secondary | ICD-10-CM | POA: Diagnosis not present

## 2020-08-22 ENCOUNTER — Other Ambulatory Visit: Payer: Self-pay

## 2020-08-22 ENCOUNTER — Emergency Department
Admission: EM | Admit: 2020-08-22 | Discharge: 2020-08-23 | Disposition: A | Discharge status: Home / Self Care | Payer: Medicare Other | Attending: Emergency Medicine | Admitting: Emergency Medicine

## 2020-08-22 ENCOUNTER — Encounter: Payer: Self-pay | Admitting: Emergency Medicine

## 2020-08-22 DIAGNOSIS — N183 Chronic kidney disease, stage 3 unspecified: Secondary | ICD-10-CM | POA: Diagnosis not present

## 2020-08-22 DIAGNOSIS — Z7982 Long term (current) use of aspirin: Secondary | ICD-10-CM | POA: Diagnosis not present

## 2020-08-22 DIAGNOSIS — I2510 Atherosclerotic heart disease of native coronary artery without angina pectoris: Secondary | ICD-10-CM | POA: Insufficient documentation

## 2020-08-22 DIAGNOSIS — I129 Hypertensive chronic kidney disease with stage 1 through stage 4 chronic kidney disease, or unspecified chronic kidney disease: Secondary | ICD-10-CM | POA: Insufficient documentation

## 2020-08-22 DIAGNOSIS — N3091 Cystitis, unspecified with hematuria: Secondary | ICD-10-CM | POA: Insufficient documentation

## 2020-08-22 DIAGNOSIS — Z79899 Other long term (current) drug therapy: Secondary | ICD-10-CM | POA: Diagnosis not present

## 2020-08-22 DIAGNOSIS — E1122 Type 2 diabetes mellitus with diabetic chronic kidney disease: Secondary | ICD-10-CM | POA: Insufficient documentation

## 2020-08-22 DIAGNOSIS — R52 Pain, unspecified: Secondary | ICD-10-CM | POA: Diagnosis not present

## 2020-08-22 DIAGNOSIS — Z7984 Long term (current) use of oral hypoglycemic drugs: Secondary | ICD-10-CM | POA: Diagnosis not present

## 2020-08-22 DIAGNOSIS — Z8616 Personal history of COVID-19: Secondary | ICD-10-CM | POA: Insufficient documentation

## 2020-08-22 DIAGNOSIS — Z87891 Personal history of nicotine dependence: Secondary | ICD-10-CM | POA: Diagnosis not present

## 2020-08-22 DIAGNOSIS — R58 Hemorrhage, not elsewhere classified: Secondary | ICD-10-CM | POA: Diagnosis not present

## 2020-08-22 DIAGNOSIS — R319 Hematuria, unspecified: Secondary | ICD-10-CM | POA: Diagnosis present

## 2020-08-22 DIAGNOSIS — I10 Essential (primary) hypertension: Secondary | ICD-10-CM | POA: Diagnosis not present

## 2020-08-22 DIAGNOSIS — R1084 Generalized abdominal pain: Secondary | ICD-10-CM | POA: Diagnosis not present

## 2020-08-22 DIAGNOSIS — Z7902 Long term (current) use of antithrombotics/antiplatelets: Secondary | ICD-10-CM | POA: Diagnosis not present

## 2020-08-22 LAB — CBC WITH DIFFERENTIAL/PLATELET
Abs Immature Granulocytes: 0.06 10*3/uL (ref 0.00–0.07)
Basophils Absolute: 0.1 10*3/uL (ref 0.0–0.1)
Basophils Relative: 1 %
Eosinophils Absolute: 0.2 10*3/uL (ref 0.0–0.5)
Eosinophils Relative: 2 %
HCT: 34.6 % — ABNORMAL LOW (ref 39.0–52.0)
Hemoglobin: 10.9 g/dL — ABNORMAL LOW (ref 13.0–17.0)
Immature Granulocytes: 1 %
Lymphocytes Relative: 23 %
Lymphs Abs: 3 10*3/uL (ref 0.7–4.0)
MCH: 28.7 pg (ref 26.0–34.0)
MCHC: 31.5 g/dL (ref 30.0–36.0)
MCV: 91.1 fL (ref 80.0–100.0)
Monocytes Absolute: 1.2 10*3/uL — ABNORMAL HIGH (ref 0.1–1.0)
Monocytes Relative: 9 %
Neutro Abs: 8.4 10*3/uL — ABNORMAL HIGH (ref 1.7–7.7)
Neutrophils Relative %: 64 %
Platelets: 220 10*3/uL (ref 150–400)
RBC: 3.8 MIL/uL — ABNORMAL LOW (ref 4.22–5.81)
RDW: 15.6 % — ABNORMAL HIGH (ref 11.5–15.5)
WBC: 13 10*3/uL — ABNORMAL HIGH (ref 4.0–10.5)
nRBC: 0 % (ref 0.0–0.2)

## 2020-08-22 LAB — PROTIME-INR
INR: 1.1 (ref 0.8–1.2)
Prothrombin Time: 13.8 seconds (ref 11.4–15.2)

## 2020-08-22 LAB — URINALYSIS, COMPLETE (UACMP) WITH MICROSCOPIC
Bacteria, UA: NONE SEEN
RBC / HPF: >50 RBC/hpf — ABNORMAL HIGH (ref 0–5)
Specific Gravity, Urine: 1.012 (ref 1.005–1.030)
Squamous Epithelial / HPF: NONE SEEN (ref 0–5)
WBC, UA: >50 WBC/hpf — ABNORMAL HIGH (ref 0–5)

## 2020-08-22 LAB — BASIC METABOLIC PANEL
Anion gap: 6 (ref 5–15)
BUN: 40 mg/dL — ABNORMAL HIGH (ref 8–23)
CO2: 22 mmol/L (ref 22–32)
Calcium: 8.6 mg/dL — ABNORMAL LOW (ref 8.9–10.3)
Chloride: 110 mmol/L (ref 98–111)
Creatinine, Ser: 1.92 mg/dL — ABNORMAL HIGH (ref 0.61–1.24)
GFR, Estimated: 33 mL/min — ABNORMAL LOW (ref >60)
Glucose, Bld: 130 mg/dL — ABNORMAL HIGH (ref 70–99)
Potassium: 5 mmol/L (ref 3.5–5.1)
Sodium: 138 mmol/L (ref 135–145)

## 2020-08-22 MED ORDER — SODIUM CHLORIDE 0.9 % IV BOLUS
1000.0000 mL | Freq: Once | INTRAVENOUS | Status: AC
  Administered 2020-08-23: 1000 mL via INTRAVENOUS

## 2020-08-22 MED ORDER — SODIUM CHLORIDE 0.9 % IV SOLN
1.0000 g | Freq: Once | INTRAVENOUS | Status: AC
  Administered 2020-08-23: 1 g via INTRAVENOUS
  Filled 2020-08-22: qty 10

## 2020-08-22 NOTE — ED Triage Notes (Addendum)
Pt arrives via ems from [facility]. Ems reports pt bleeding from penis. Saturated 2 adult briefs and unknown when bleeding started. Denies pain. Staff reported bright red blood. LLQ pain with palpation. Ems states A&O x 4   Cbg 157 Bp 148/63 Hr 70 99% RA  20 g IV left forearm placed by ems

## 2020-08-23 DIAGNOSIS — M2550 Pain in unspecified joint: Secondary | ICD-10-CM | POA: Diagnosis not present

## 2020-08-23 DIAGNOSIS — Z7401 Bed confinement status: Secondary | ICD-10-CM | POA: Diagnosis not present

## 2020-08-23 DIAGNOSIS — I959 Hypotension, unspecified: Secondary | ICD-10-CM | POA: Diagnosis not present

## 2020-08-23 DIAGNOSIS — N3091 Cystitis, unspecified with hematuria: Secondary | ICD-10-CM | POA: Diagnosis not present

## 2020-08-23 DIAGNOSIS — R5381 Other malaise: Secondary | ICD-10-CM | POA: Diagnosis not present

## 2020-08-23 MED ORDER — CEFPODOXIME PROXETIL 200 MG PO TABS: 200.0000 mg | ORAL_TABLET | Freq: Two times a day (BID) | ORAL | 0 refills | Status: DC

## 2020-08-23 NOTE — ED Notes (Signed)
Bladder scan shows 220ml of urine in bladder.

## 2020-08-23 NOTE — ED Provider Notes (Signed)
[facility] Emergency Department Provider Note  ____________________________________________  Time seen: Approximately 12:26 AM  I have reviewed the triage vital signs and the nursing notes.   HISTORY  Chief Complaint Hematuria   HPI [name]. is a 87 y.o. male with a past history of CAD, diabetes, hypertension who was brought to the ED for evaluation of gross hematuria.  Patient reports feeling his usual self, no acute symptoms but when nursing home staff went to change his diaper this evening, they noted that it was soaked with blood.  Patient denies fever or weakness.  Does report a history of UTI about 6 months ago.  He has had persistent gross hematuria throughout the evening.  No aggravating or alleviating factors.    Past Medical History:  Diagnosis Date   Agent orange exposure 1966 or 1971   Anemia    Anxiety    Arthritis    "hands and back" (01/20/2013)   CAD (coronary artery disease)    native vessel   Carotid artery disease (HCC)    nonobstructive   Cataract    Cecal diverticulitis 2008   drained   Cellulitis, gluteal    bilateral for the past 6 months/notes 01/20/2013   Chronic lower back pain    Chronic renal insufficiency    Depression    Diabetes mellitus type II    Exertional shortness of breath    HTN (hypertension)    Hyperlipidemia    Hypokalemia    Malnutrition (HCC)    protein-calorie   Myocardial infarction ([place])    "silent; before OHS" (01/20/2013)   PTSD (post-traumatic stress disorder)    Spina bifida ([place])    Urinary incontinence      Patient Active Problem List   Diagnosis Date Noted   Bilateral pneumonia 03/03/2020   CKD (chronic kidney disease), stage III ([place]) 03/02/2020   Acute pyelonephritis 03/02/2020   S/P AKA (above knee amputation), right ([place]) 03/02/2020   Sepsis ([place]) 03/02/2020   Pyelonephritis 03/02/2020   PVD (peripheral vascular disease) ([place]) 03/25/2019   Osteomyelitis of right  foot ([place]) 03/25/2019   Frequency of urination and polyuria 03/25/2019   Atherosclerosis of native arteries of the extremities with ulceration ([place]) 03/14/2019   COVID-19 virus infection 03/14/2019   Sacral pressure sore 11/25/2018   Arterial hemorrhage 11/18/2018   Hypoglycemia associated with type 2 diabetes mellitus ([place]) 11/18/2018   General weakness 09/22/2016   Bilateral carpal tunnel syndrome 09/22/2016   Leukocytosis 07/30/2015   Urinary incontinence 12/13/2012   Chronic constipation 23/55/7322   Metabolic syndrome 02/54/2706   Spina bifida ([place]) 11/18/2012   CAD (coronary artery disease) 03/08/2009   Type 2 diabetes mellitus with diabetic nephropathy ([place]) 10/29/2008   Type 2 diabetes mellitus with stage 3 chronic kidney disease, without long-term current use of insulin ([place]) 10/29/2008   PTSD 10/29/2008   Essential hypertension 10/29/2008     Past Surgical History:  Procedure Laterality Date   APPLICATION OF WOUND VAC Right 04/08/2019   Procedure: APPLICATION OF WOUND VAC;  Surgeon: Edrick Kins, DPM;  Location: [place];  Service: Podiatry;  Laterality: Right;   BONE BIOPSY Right 04/08/2019   Procedure: BONE BIOPSY;  Surgeon: Edrick Kins, DPM;  Location: [place];  Service: Podiatry;  Laterality: Right;   CARDIAC CATHETERIZATION  1998   "couple before my OHS" (01/20/2013)   CARPAL TUNNEL RELEASE Right 1980's   CATARACT EXTRACTION W/ INTRAOCULAR LENS  IMPLANT, BILATERAL Bilateral ~  2010   CORONARY ARTERY BYPASS GRAFT  1998   "CABG X4" (01/20/2013)   CYSTECTOMY  2000's   "cytal cyst on my intestines; probed then drained it; hospitalized for 13 days; NPO" (01/20/2013)   EYE SURGERY     HEMORRHOID BANDING  ~ 10/2012   IR FLUORO GUIDE CV LINE RIGHT  04/01/2019   IR US GUIDE VASC ACCESS RIGHT  04/01/2019   KNEE SURGERY Left 1964   "exploratory; sewed it up w/wire" (01/20/2013)   LOWER EXTREMITY ANGIOGRAPHY Right 03/22/2019   Procedure:  LOWER EXTREMITY ANGIOGRAPHY;  Surgeon: Katha Cabal, MD;  Location: [place] [facility];  Service: Cardiovascular;  Laterality: Right;   LOWER EXTREMITY ANGIOGRAPHY Left 03/29/2019   Procedure: LOWER EXTREMITY ANGIOGRAPHY;  Surgeon: Katha Cabal, MD;  Location: [place] [facility];  Service: Cardiovascular;  Laterality: Left;   PROSTATE SURGERY     SPINE SURGERY  11/24/1932   "Spina bifida surgery"   STRABISMUS SURGERY Bilateral 03/29/2015   Procedure: REPAIR STRABISMUS BILATERAL;  Surgeon: Lamonte Sakai, MD;  Location: [place];  Service: Ophthalmology;  Laterality: Bilateral;   WOUND DEBRIDEMENT Right 04/08/2019   Procedure: DEBRIDEMENT WOUND;  Surgeon: Edrick Kins, DPM;  Location: [name];  Service: Podiatry;  Laterality: Right;     Prior to Admission medications   Medication Sig Start Date End Date Taking? Authorizing Provider  cefpodoxime (VANTIN) 200 MG tablet Take 1 tablet (200 mg total) by mouth 2 (two) times daily. 08/23/20  Yes Ward, Kristen N, DO  12 HOUR NASAL DECONGESTANT 0.05 % nasal spray Place 2 sprays into both nostrils 2 (two) times daily. For 3 days 02/29/20   [provider]  ARTIFICIAL TEARS 0.2-0.2-1 % SOLN Apply to eye 3 (three) times daily. Instill 2 drops into each eye 3 times daily. 02/20/20   [provider]  aspirin 81 MG tablet Take 81 mg by mouth daily.    [provider]  buPROPion (WELLBUTRIN XL) 150 MG 24 hr tablet Take 150 mg by mouth daily.    [provider]  Cadexomer Iodine (IODOFLEX) 0.9 % PADS Apply 1 application topically See admin instructions. Twice a week heel    [provider]  clopidogrel (PLAVIX) 75 MG tablet Take 1 tablet (75 mg total) by mouth daily. 03/23/19   Schnier, Dolores Lory, MD  Dextromethorphan-guaiFENesin (MUCINEX DM) 30-600 MG TB12 Take 1 tablet by mouth 2 (two) times daily. For 5 days 02/29/20   [provider]  docusate sodium (COLACE) 100 MG  capsule Take 100 mg by mouth at bedtime.    [provider]  ezetimibe (ZETIA) 10 MG tablet Take 10 mg by mouth at bedtime.    [provider]  [facility] 10 GM/15ML SOLN Take 30 mLs by mouth every Monday, Wednesday, and Friday. 01/10/20   [provider]  melatonin 5 MG TABS Take 5 mg by mouth at bedtime.    [provider]  metoprolol tartrate (LOPRESSOR) 25 MG tablet Take 0.5 tablets (12.5 mg total) by mouth 2 (two) times daily. 12/15/18   Dettinger, Fransisca Kaufmann, MD  Multiple Vitamins-Minerals (PRESERVISION AREDS 2 PO) Take 1 capsule by mouth 2 (two) times daily.    [provider]  nystatin (MYCOSTATIN/NYSTOP) powder Apply topically 2 (two) times daily. Apply to groin and perineum twice a day 03/06/20   Jennye Boroughs, MD  omeprazole (PRILOSEC) 20 MG capsule Take 20 mg by mouth daily. 12/22/19   [provider]  senna (SENOKOT) 8.6 MG tablet Take 2 tablets by mouth 2 (two) times daily.    [provider]  simvastatin (ZOCOR) 20 MG tablet Take 20 mg by mouth at bedtime.    [provider]  sitaGLIPtin-metformin (JANUMET) 50-500 MG tablet Take 1 tablet by mouth daily. with food 10/20/18   Dettinger, Fransisca Kaufmann, MD     Allergies Sulfonamide derivatives, Aspirin, Nitrofurantoin, Penicillins, and Sulfa antibiotics   Family History  Problem Relation Age of Onset   Heart disease Mother    Heart attack Mother    Heart disease Father    Heart attack Father    Cancer Sister        breast   Cancer Sister        lung and ovarian    Social History Social History   Tobacco Use   Smoking status: Former    Packs/day: 2.00    Years: 20.00    Pack years: 40.00    Types: Cigarettes    Start date: 11/20/1948    Quit date: 08/19/1980    Years since quitting: 40.0   Smokeless tobacco: Former    Types: Chew   Tobacco comments:    01/20/2013 "quit chewing 20 yr ago"  Vaping Use   Vaping Use: Never used  Substance Use Topics    Alcohol use: No    Comment: 01/20/2013 "quit drinking 08/19/1980"   Drug use: No    Review of Systems  Constitutional:   No fever or chills.  ENT:   No sore throat. No rhinorrhea. Cardiovascular:   No chest pain or syncope. Respiratory:   No dyspnea or cough. Gastrointestinal:   Negative for abdominal pain, vomiting and diarrhea.  Musculoskeletal:   Negative for focal pain or swelling All other systems reviewed and are negative except as documented above in ROS and HPI.  ____________________________________________   PHYSICAL EXAM:  VITAL SIGNS: ED Triage Vitals [08/22/20 1730]  Enc Vitals Group     BP (!) 142/53     Pulse Rate 65     Resp 18     Temp 98.1 F (36.7 C)     Temp Source Oral     SpO2 93 %     Weight 160 lb (72.6 kg)     Height 5\' 8"  (1.727 m)     Head Circumference      Peak Flow      Pain Score 0     Pain Loc      Pain Edu?      Excl. in [place]?     Vital signs reviewed, nursing assessments reviewed.   Constitutional:   Alert and oriented. Non-toxic appearance.  Tolerating p.o. Eyes:   Conjunctivae are normal. EOMI. PERRL. ENT      Head:   Normocephalic and atraumatic.      Nose:   Normal.      Mouth/Throat:   Normal, moist mucosa.      Neck:   No meningismus. Full ROM. Hematological/Lymphatic/Immunilogical:   No cervical lymphadenopathy. Cardiovascular:   RRR. Symmetric bilateral radial and DP pulses.  No murmurs. Cap refill less than 2 seconds. Respiratory:   Normal respiratory effort without tachypnea/retractions. Breath sounds are clear and equal bilaterally. No wheezes/rales/rhonchi. Gastrointestinal:   Soft with mild suprapubic tenderness. Non distended. There is no CVA tenderness.  No rebound, rigidity, or guarding.  . Genitourinary:   Uncircumcised.200 mL on bladder scan.  Spontaneous passage of thin watery red urine into his diaper Musculoskeletal:  Normal range of motion in all extremities. No joint effusions.  No lower extremity tenderness.   No edema. Neurologic:   Normal speech and language.  Motor grossly intact. No acute focal neurologic deficits are appreciated.  Skin:    Skin is warm, dry and intact. No rash noted.  No petechiae, purpura, or bullae.  ____________________________________________    LABS (pertinent positives/negatives) (all labs ordered are listed, but only abnormal results are displayed) Labs Reviewed  CBC WITH DIFFERENTIAL/PLATELET - Abnormal; Notable for the following components:      Result Value   WBC 13.0 (*)    RBC 3.80 (*)    Hemoglobin 10.9 (*)    HCT 34.6 (*)    RDW 15.6 (*)    Neutro Abs 8.4 (*)    Monocytes Absolute 1.2 (*)    All other components within normal limits  BASIC METABOLIC PANEL - Abnormal; Notable for the following components:   Glucose, Bld 130 (*)    BUN 40 (*)    Creatinine, Ser 1.92 (*)    Calcium 8.6 (*)    GFR, Estimated 33 (*)    All other components within normal limits  URINALYSIS, COMPLETE (UACMP) WITH MICROSCOPIC - Abnormal; Notable for the following components:   Color, Urine RED (*)    APPearance TURBID (*)    Glucose, UA   (*)    Value: TEST NOT REPORTED DUE TO COLOR INTERFERENCE OF URINE PIGMENT   Hgb urine dipstick   (*)    Value: TEST NOT REPORTED DUE TO COLOR INTERFERENCE OF URINE PIGMENT   Bilirubin Urine   (*)    Value: TEST NOT REPORTED DUE TO COLOR INTERFERENCE OF URINE PIGMENT   Ketones, ur   (*)    Value: TEST NOT REPORTED DUE TO COLOR INTERFERENCE OF URINE PIGMENT   Protein, ur   (*)    Value: TEST NOT REPORTED DUE TO COLOR INTERFERENCE OF URINE PIGMENT   Nitrite   (*)    Value: TEST NOT REPORTED DUE TO COLOR INTERFERENCE OF URINE PIGMENT   Leukocytes,Ua   (*)    Value: TEST NOT REPORTED DUE TO COLOR INTERFERENCE OF URINE PIGMENT   RBC / HPF >50 (*)    WBC, UA >50 (*)    All other components within normal limits  URINE CULTURE  PROTIME-INR    ____________________________________________   EKG    ____________________________________________    RADIOLOGY  No results found.  ____________________________________________   PROCEDURES Procedures  ____________________________________________    CLINICAL IMPRESSION / ASSESSMENT AND PLAN / ED COURSE  Medications ordered in the ED: Medications  cefTRIAXone (ROCEPHIN) 1 g in sodium chloride 0.9 % 100 mL IVPB (1 g Intravenous New Bag/Given 08/23/20 0001)  sodium chloride 0.9 % bolus 1,000 mL (1,000 mLs Intravenous New Bag/Given 08/23/20 0000)    Pertinent labs & imaging results that were available during my care of the patient were reviewed by me and considered in my medical decision making (see chart for details).  Bobby Chacko. was evaluated in Emergency Department on 08/23/2020 for the symptoms described in the history of present illness. He was evaluated in the context of the global COVID-19 pandemic, which necessitated consideration that the patient might be at risk for infection with the SARS-CoV-2 virus that causes COVID-19. Institutional protocols and algorithms that pertain to the evaluation of patients at risk for COVID-19 are in a state of rapid change based on information released by regulatory bodies including the CDC and federal and  state organizations. These policies and algorithms were followed during the patient's care in the ED.     Clinical Course as of 08/23/20 0026  Wed Aug 22, 2020  2256 Patient presents with hematuria.  Urine output appears thin and watery but grossly bloody.  No clots.  Vital signs unremarkable, no pain.  Exam and urinalysis consistent with cystitis.  Creatinine elevated.  No pain to suggest obstructing stone.  Not septic.  Offered admission, patient feels comfortable returning to [facility].  I will give him IV fluids for hydration, a dose of Rocephin and prescribed antibiotics. [PS]    Clinical Course User Index [PS]  Carrie Mew, MD   Usual return precautions discussed.   ____________________________________________   FINAL CLINICAL IMPRESSION(S) / ED DIAGNOSES    Final diagnoses:  Hemorrhagic cystitis     ED Discharge Orders          Ordered    cefpodoxime (VANTIN) 200 MG tablet  2 times daily        08/23/20 0022            Portions of this note were generated with dragon dictation software. Dictation errors may occur despite best attempts at proofreading.   Carrie Mew, MD 08/23/20 [phone]

## 2020-08-23 NOTE — ED Notes (Signed)
Patient discharged to home per MD order. Patient in stable condition, and deemed medically cleared by ED provider for discharge. Discharge instructions reviewed with patient/family using "Teach Back"; verbalized understanding of medication education and administration, and information about follow-up care. Denies further concerns. Awaiting EMS transport back to facility.

## 2020-08-24 LAB — URINE CULTURE: Culture: <10000 — AB

## 2020-08-30 DIAGNOSIS — N3091 Cystitis, unspecified with hematuria: Secondary | ICD-10-CM | POA: Diagnosis not present

## 2020-09-03 DIAGNOSIS — M6281 Muscle weakness (generalized): Secondary | ICD-10-CM | POA: Diagnosis not present

## 2020-09-03 DIAGNOSIS — N1832 Chronic kidney disease, stage 3b: Secondary | ICD-10-CM | POA: Diagnosis not present

## 2020-09-03 DIAGNOSIS — Z89611 Acquired absence of right leg above knee: Secondary | ICD-10-CM | POA: Diagnosis not present

## 2020-09-18 DIAGNOSIS — M25511 Pain in right shoulder: Secondary | ICD-10-CM | POA: Diagnosis not present

## 2020-09-18 DIAGNOSIS — R54 Age-related physical debility: Secondary | ICD-10-CM | POA: Diagnosis not present

## 2020-09-24 DIAGNOSIS — M6281 Muscle weakness (generalized): Secondary | ICD-10-CM | POA: Diagnosis not present

## 2020-09-24 DIAGNOSIS — Z89611 Acquired absence of right leg above knee: Secondary | ICD-10-CM | POA: Diagnosis not present

## 2020-09-24 DIAGNOSIS — N1832 Chronic kidney disease, stage 3b: Secondary | ICD-10-CM | POA: Diagnosis not present

## 2020-09-24 DIAGNOSIS — E118 Type 2 diabetes mellitus with unspecified complications: Secondary | ICD-10-CM | POA: Diagnosis not present

## 2020-10-29 DIAGNOSIS — E118 Type 2 diabetes mellitus with unspecified complications: Secondary | ICD-10-CM | POA: Diagnosis not present

## 2020-10-29 DIAGNOSIS — Z89611 Acquired absence of right leg above knee: Secondary | ICD-10-CM | POA: Diagnosis not present

## 2020-10-29 DIAGNOSIS — G894 Chronic pain syndrome: Secondary | ICD-10-CM | POA: Diagnosis not present

## 2020-10-29 DIAGNOSIS — M6281 Muscle weakness (generalized): Secondary | ICD-10-CM | POA: Diagnosis not present

## 2020-12-13 DIAGNOSIS — D638 Anemia in other chronic diseases classified elsewhere: Secondary | ICD-10-CM | POA: Diagnosis not present

## 2020-12-13 DIAGNOSIS — Q059 Spina bifida, unspecified: Secondary | ICD-10-CM | POA: Diagnosis not present

## 2020-12-13 DIAGNOSIS — D72829 Elevated white blood cell count, unspecified: Secondary | ICD-10-CM | POA: Diagnosis not present

## 2021-01-01 DIAGNOSIS — L853 Xerosis cutis: Secondary | ICD-10-CM | POA: Diagnosis not present

## 2021-01-01 DIAGNOSIS — R5381 Other malaise: Secondary | ICD-10-CM | POA: Diagnosis not present

## 2021-01-28 DIAGNOSIS — Q059 Spina bifida, unspecified: Secondary | ICD-10-CM | POA: Diagnosis not present

## 2021-01-28 DIAGNOSIS — R1111 Vomiting without nausea: Secondary | ICD-10-CM | POA: Diagnosis not present

## 2021-01-28 DIAGNOSIS — R768 Other specified abnormal immunological findings in serum: Secondary | ICD-10-CM | POA: Diagnosis not present

## 2021-01-28 DIAGNOSIS — R1110 Vomiting, unspecified: Secondary | ICD-10-CM | POA: Diagnosis not present

## 2021-01-28 DIAGNOSIS — R059 Cough, unspecified: Secondary | ICD-10-CM | POA: Diagnosis not present

## 2021-01-29 DIAGNOSIS — I10 Essential (primary) hypertension: Secondary | ICD-10-CM | POA: Diagnosis not present

## 2021-01-29 DIAGNOSIS — E785 Hyperlipidemia, unspecified: Secondary | ICD-10-CM | POA: Diagnosis not present

## 2021-01-29 DIAGNOSIS — R1110 Vomiting, unspecified: Secondary | ICD-10-CM | POA: Diagnosis not present

## 2021-02-06 DIAGNOSIS — R278 Other lack of coordination: Secondary | ICD-10-CM | POA: Diagnosis not present

## 2021-02-06 DIAGNOSIS — R2681 Unsteadiness on feet: Secondary | ICD-10-CM | POA: Diagnosis not present

## 2021-02-06 DIAGNOSIS — M6281 Muscle weakness (generalized): Secondary | ICD-10-CM | POA: Diagnosis not present

## 2021-02-19 DIAGNOSIS — D500 Iron deficiency anemia secondary to blood loss (chronic): Secondary | ICD-10-CM | POA: Diagnosis not present

## 2021-02-19 DIAGNOSIS — E785 Hyperlipidemia, unspecified: Secondary | ICD-10-CM | POA: Diagnosis not present

## 2021-02-19 DIAGNOSIS — G629 Polyneuropathy, unspecified: Secondary | ICD-10-CM | POA: Diagnosis not present

## 2021-02-19 DIAGNOSIS — I120 Hypertensive chronic kidney disease with stage 5 chronic kidney disease or end stage renal disease: Secondary | ICD-10-CM | POA: Diagnosis not present

## 2021-04-02 IMAGING — US IR FLUORO GUIDE CV LINE*R*
1 series · 2 of 2 positions shown · non-contrast
Comparison: none

CLINICAL DATA: Osteomyelitis of the right calcaneus and need for IV
antibiotic therapy. The patient requires a tunneled central venous
catheter as he is not a candidate for an arm PICC line due to
underlying chronic kidney disease with potential need for future
dialysis access.

[Series 1: ir fluoro guide cv line*right* · 2 of 2 slices shown]
[im 1/2]
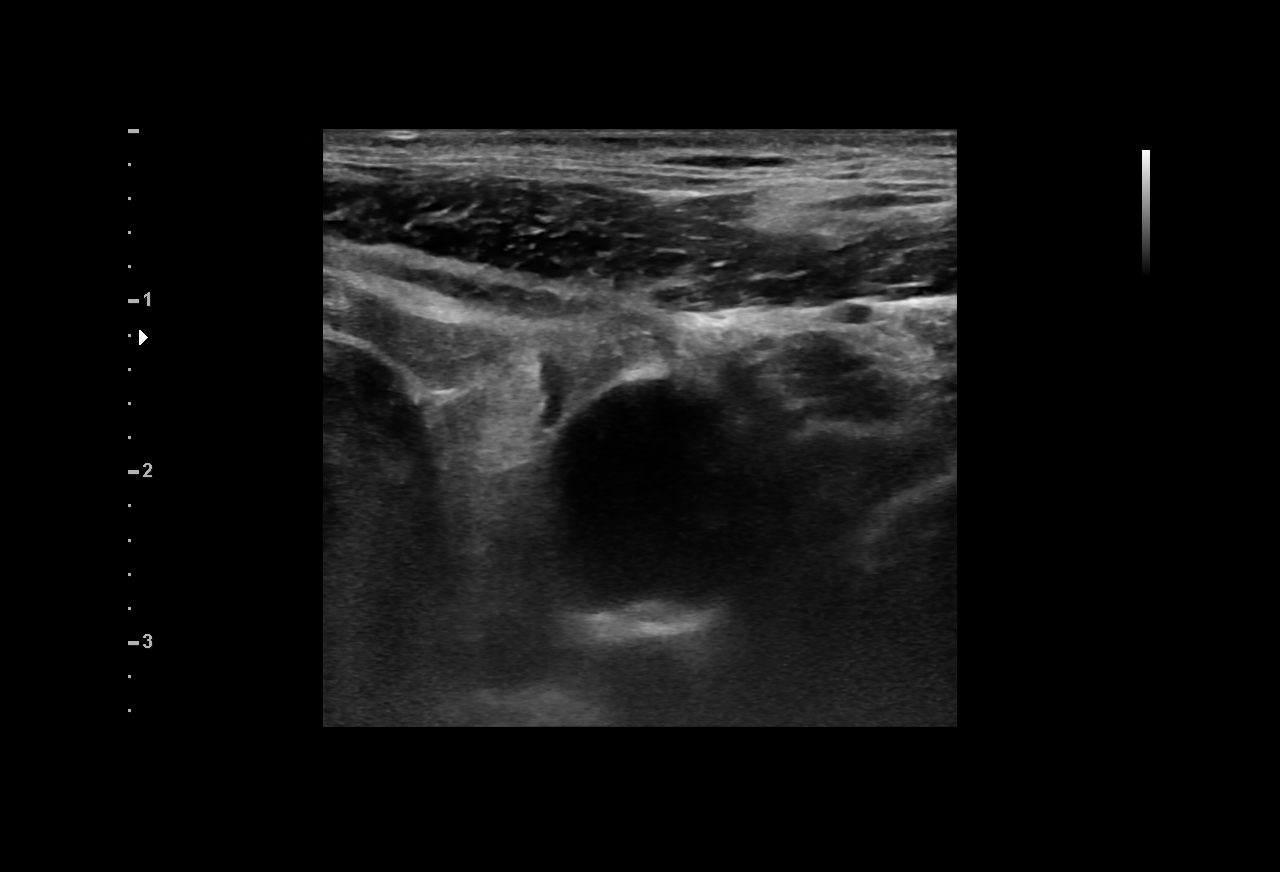
[im 2/2]
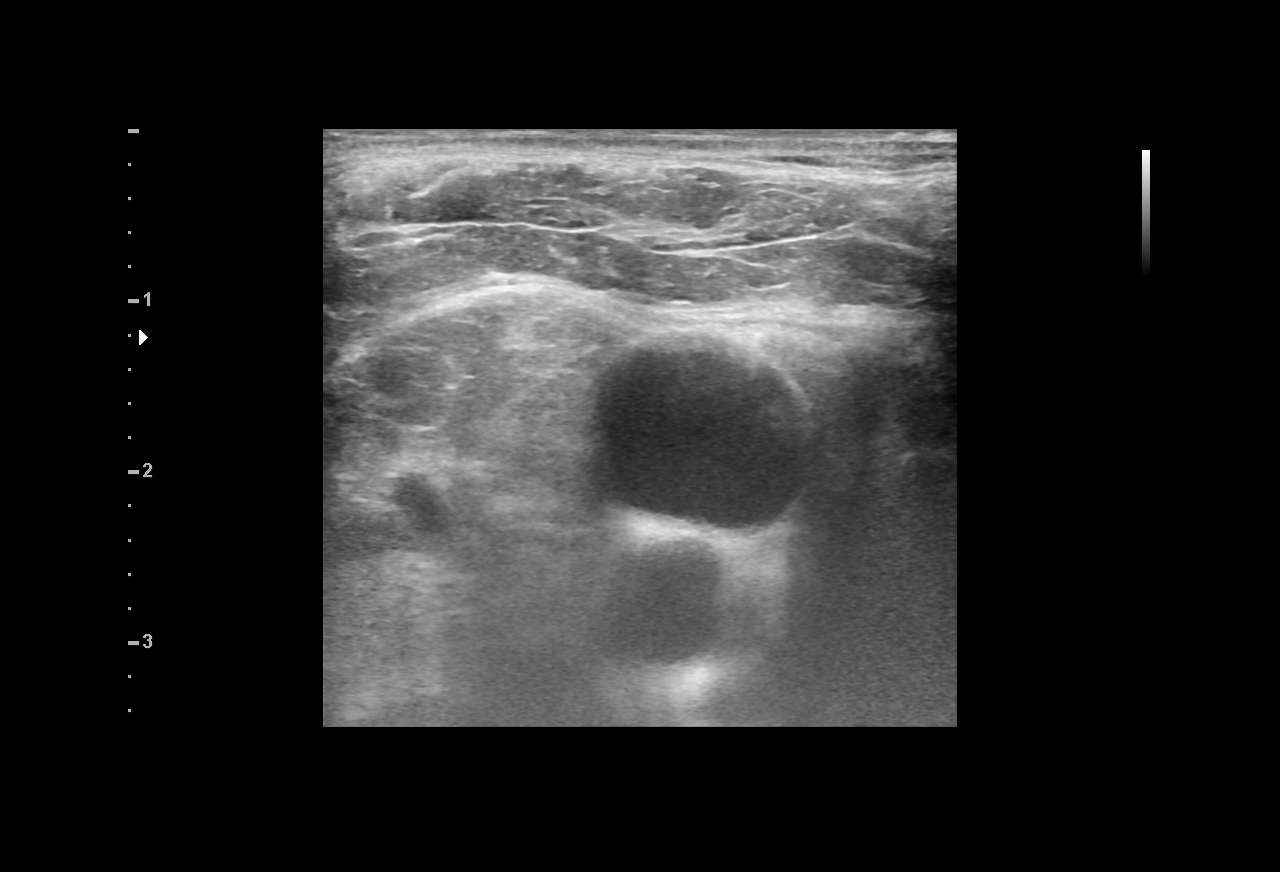

[2 of 2 positions shown; findings below may reference images not displayed]

EXAM:
TUNNELED CENTRAL VENOUS CATHETER PLACEMENT WITH ULTRASOUND AND
FLUOROSCOPIC GUIDANCE

ANESTHESIA/SEDATION:
None

MEDICATIONS:
None

FLUOROSCOPY TIME:  2 minutes and 42 seconds.  13.7 mGy.

PROCEDURE:
The procedure, risks, benefits, and alternatives were explained to
the patient. Questions regarding the procedure were encouraged and
answered. The patient understands and consents to the procedure. A
timeout was performed prior to initiating the procedure.

The right neck and chest were prepped with chlorhexidine in a
sterile fashion, and a sterile drape was applied covering the
operative field. Maximum barrier sterile technique with sterile
gowns and gloves were used for the procedure. Local anesthesia was
provided with 1% lidocaine.

After creating a small venotomy incision, a 21 gauge needle was
advanced into the right internal jugular vein under direct,
real-time ultrasound guidance. Ultrasound image documentation was
performed. After securing guidewire access, a 5 French peel-away
sheath was placed. A wire was kinked to measure an appropriate
length of catheter.

A 5 French, single-lumen tunneled power line was chosen for
placement. This was tunneled in a retrograde fashion from the chest
wall to the venotomy incision and cut to 20 cm based on guidewire
estimate.

The catheter was then placed through the sheath and the sheath
removed. Final catheter positioning was confirmed and documented
with a fluoroscopic spot image. The catheter was aspirated and
flushed with saline.

The venotomy incision was closed with subcuticular 4-0 Vicryl.
Dermabond was applied to the incision. The catheter exit site was
secured with 0-Prolene retention sutures.

COMPLICATIONS:
None.  No pneumothorax.
FINDINGS: After catheter placement, the tip lies in the lower SVC. The
catheter aspirates normally and is ready for immediate use.
IMPRESSION: Placement of tunneled central venous catheter via the right internal
jugular vein. The catheter tip lies in the lower SVC. The catheter
is ready for immediate use.

## 2021-07-26 ENCOUNTER — Emergency Department
Admission: EM | Admit: 2021-07-26 | Discharge: 2021-07-27 | Disposition: A | Discharge status: Home / Self Care | Payer: Medicare Other | Attending: Emergency Medicine | Admitting: Emergency Medicine

## 2021-07-26 ENCOUNTER — Encounter: Payer: Self-pay | Admitting: Emergency Medicine

## 2021-07-26 ENCOUNTER — Emergency Department: Payer: Medicare Other

## 2021-07-26 DIAGNOSIS — R140 Abdominal distension (gaseous): Secondary | ICD-10-CM

## 2021-07-26 DIAGNOSIS — N3000 Acute cystitis without hematuria: Secondary | ICD-10-CM

## 2021-07-26 DIAGNOSIS — R109 Unspecified abdominal pain: Secondary | ICD-10-CM | POA: Diagnosis present

## 2021-07-26 DIAGNOSIS — I10 Essential (primary) hypertension: Secondary | ICD-10-CM | POA: Insufficient documentation

## 2021-07-26 DIAGNOSIS — K5900 Constipation, unspecified: Secondary | ICD-10-CM | POA: Insufficient documentation

## 2021-07-26 DIAGNOSIS — I2510 Atherosclerotic heart disease of native coronary artery without angina pectoris: Secondary | ICD-10-CM | POA: Insufficient documentation

## 2021-07-26 DIAGNOSIS — E119 Type 2 diabetes mellitus without complications: Secondary | ICD-10-CM | POA: Insufficient documentation

## 2021-07-26 DIAGNOSIS — Z79899 Other long term (current) drug therapy: Secondary | ICD-10-CM | POA: Diagnosis not present

## 2021-07-26 LAB — COMPREHENSIVE METABOLIC PANEL
ALT: 9 U/L (ref 0–44)
AST: 14 U/L — ABNORMAL LOW (ref 15–41)
Albumin: 3.2 g/dL — ABNORMAL LOW (ref 3.5–5.0)
Alkaline Phosphatase: 70 U/L (ref 38–126)
Anion gap: 3 — ABNORMAL LOW (ref 5–15)
BUN: 47 mg/dL — ABNORMAL HIGH (ref 8–23)
CO2: 25 mmol/L (ref 22–32)
Calcium: 8.2 mg/dL — ABNORMAL LOW (ref 8.9–10.3)
Chloride: 112 mmol/L — ABNORMAL HIGH (ref 98–111)
Creatinine, Ser: 1.89 mg/dL — ABNORMAL HIGH (ref 0.61–1.24)
GFR, Estimated: 34 mL/min — ABNORMAL LOW (ref >60)
Glucose, Bld: 194 mg/dL — ABNORMAL HIGH (ref 70–99)
Potassium: 4.6 mmol/L (ref 3.5–5.1)
Sodium: 140 mmol/L (ref 135–145)
Total Bilirubin: 0.5 mg/dL (ref 0.3–1.2)
Total Protein: 5.9 g/dL — ABNORMAL LOW (ref 6.5–8.1)

## 2021-07-26 LAB — CBC
HCT: 33.1 % — ABNORMAL LOW (ref 39.0–52.0)
Hemoglobin: 10.1 g/dL — ABNORMAL LOW (ref 13.0–17.0)
MCH: 28.7 pg (ref 26.0–34.0)
MCHC: 30.5 g/dL (ref 30.0–36.0)
MCV: 94 fL (ref 80.0–100.0)
Platelets: 171 10*3/uL (ref 150–400)
RBC: 3.52 MIL/uL — ABNORMAL LOW (ref 4.22–5.81)
RDW: 14.7 % (ref 11.5–15.5)
WBC: 9.7 10*3/uL (ref 4.0–10.5)
nRBC: 0 % (ref 0.0–0.2)

## 2021-07-26 LAB — URINALYSIS, COMPLETE (UACMP) WITH MICROSCOPIC
Bilirubin Urine: NEGATIVE
Glucose, UA: NEGATIVE mg/dL
Hgb urine dipstick: NEGATIVE
Ketones, ur: NEGATIVE mg/dL
Nitrite: NEGATIVE
Protein, ur: 30 mg/dL — AB
Specific Gravity, Urine: 1.013 (ref 1.005–1.030)
Squamous Epithelial / HPF: NONE SEEN (ref 0–5)
WBC, UA: >50 WBC/hpf — ABNORMAL HIGH (ref 0–5)
pH: 5 (ref 5.0–8.0)

## 2021-07-26 LAB — LIPASE, BLOOD: Lipase: 29 U/L (ref 11–51)

## 2021-07-26 MED ORDER — POLYETHYLENE GLYCOL 3350 17 GM/SCOOP PO POWD: 17.0000 g | Freq: Every day | ORAL | 0 refills | Status: AC

## 2021-07-26 MED ORDER — SODIUM CHLORIDE 0.9 % IV SOLN
1.0000 g | Freq: Once | INTRAVENOUS | Status: AC
  Administered 2021-07-27: 1 g via INTRAVENOUS
  Filled 2021-07-26: qty 10

## 2021-07-26 MED ORDER — SIMETHICONE 80 MG PO CHEW: 80.0000 mg | CHEWABLE_TABLET | Freq: Four times a day (QID) | ORAL | 0 refills | Status: DC | PRN

## 2021-07-26 NOTE — ED Provider Notes (Signed)
[facility] Provider Note    Event Date/Time   First MD Initiated Contact with Patient 07/26/21 2106     (approximate)  History   Chief Complaint: Bloated  HPI  [name]. is a 88 y.o. male with a past medical history of anxiety, CAD, diabetes, hypertension, hyperlipidemia, presents to the emergency department for abdominal bloating.  According to the patient he is coming from his nursing facility for significant abdominal bloating.  Patient is not sure when his last bowel movement was but believes it was approximately 4 days ago.  Denies any nausea or vomiting.  Patient had an x-ray performed at his nursing facility that was concerning for ileus versus SBO and the patient was referred to the emergency department for further evaluation.  Patient denies any abdominal pain.  No fever.  Does state significant bloating.  Physical Exam   Triage Vital Signs: ED Triage Vitals  Enc Vitals Group     BP 07/26/21 2104 125/64     Pulse Rate 07/26/21 2104 75     Resp 07/26/21 2104 20     Temp 07/26/21 2104 98.3 F (36.8 C)     Temp Source 07/26/21 2104 Oral     SpO2 07/26/21 2104 100 %     Weight 07/26/21 2105 225 lb 5 oz (102.2 kg)     Height 07/26/21 2105 '5\' 8"'$  (1.727 m)     Head Circumference --      Peak Flow --      Pain Score 07/26/21 2104 0     Pain Loc --      Pain Edu? --      Excl. in [place]? --     Most recent vital signs: Vitals:   07/26/21 2104  BP: 125/64  Pulse: 75  Resp: 20  Temp: 98.3 F (36.8 C)  SpO2: 100%    General: Awake, no distress.  CV:  Good peripheral perfusion.  Regular rate and rhythm  Resp:  Normal effort.  Equal breath sounds bilaterally.  Abd:  Moderate distention with tympanic percussion throughout.  Nontender to palpation.    ED Results / Procedures / Treatments   RADIOLOGY  I have reviewed the CT imaging patient appears to have fairly distended sigmoid colon as well as constipation. Radiology has  interpreted the CT showing possible urethral thickening concerning for possible urinary infection.  Irregular urinary bladder, 1.4 x 0.7 nodular density in the left lower lobe recommend repeat chest CT or PET/CT for follow-up.  MEDICATIONS ORDERED IN ED: Medications - No data to display   IMPRESSION / MDM / [place] / ED COURSE  I reviewed the triage vital signs and the nursing notes.  Patient's presentation is most consistent with acute presentation with potential threat to life or bodily function.  Patient presents emergency department referred from his nursing facility for abdominal bloating concern for possible bowel obstruction.  Patient denies any abdominal pain but does have significant abdominal bloating, tympanic percussion throughout.  We will check labs obtain CT imaging and continue to closely monitor.  Patient agreeable to plan of care.  Patient is in no distress.  Differential would include gas, SBO, ileus, constipation/impaction.  Labs and CT pending.  CT scan shows gaseous distention of the sigmoid and on my review appears to have constipation but no sign of bowel obstruction per radiology.  We will start the patient on MiraLAX as well as simethicone.  Patient does have 1.4 cm left lower lobe  node which I have discussed with the patient for PCP follow-up for repeat CT imaging.  Given the bladder thickening we will obtain a urine sample, we will also refer to urology.  Urine sample is pending.  Patient care signed out to oncoming provider.  FINAL CLINICAL IMPRESSION(S) / ED DIAGNOSES   Abdominal distention Constipation   Note:  This document was prepared using Dragon voice recognition software and may include unintentional dictation errors.   Harvest Dark, MD 07/26/21 2252

## 2021-07-26 NOTE — ED Provider Notes (Signed)
-----------------------------------------   11:04 PM on 07/26/2021 -----------------------------------------  Blood pressure 130/64, pulse 60, temperature 98.3 F (36.8 C), temperature source Oral, resp. rate 19, height '5\' 8"'$  (1.727 m), weight 102.2 kg, SpO2 99 %.  Assuming care from Dr. Kerman Passey.  In short, [name]. is a 88 y.o. male with a chief complaint of Bloated .  Refer to the original H&P for additional details.  The current plan of care is to follow-up UA results.  ----------------------------------------- 1:54 AM on 07/27/2021 ----------------------------------------- Urinalysis is concerning for infection and we will send urine for culture.  Patient was given initial dose of IV Rocephin and is appropriate for outpatient management with urology follow-up.  Patient and family counseled to return to the ED for new or worsening symptoms, patient agrees with plan.    Blake Divine, MD 07/27/21 [phone]

## 2021-07-26 NOTE — ED Notes (Signed)
Pt to CT

## 2021-07-26 NOTE — ED Notes (Signed)
Pt back from CT

## 2021-07-26 NOTE — ED Triage Notes (Addendum)
Pt presents from [facility] for abdominal bloating for the last 2 days. Pt had an XR at his facility which showed "Ileus type pattern favored" and his PCP recommended he come to the ED for bowel obstruction rule out. Pt has no complaints at this time. Unknown when his last BM was ~4 days ago.

## 2021-07-26 NOTE — Discharge Instructions (Signed)
Please take your prescribed medications as written until you begin having multiple bowel movements.  You may still use these medications as needed for constipation or bloating.    As we discussed your CT scan does show a 1.4 cm nodule in your left lower lung.  Please follow-up with your doctor for repeat CT imaging in 3 to 6 months for recheck/reevaluation to rule out a cancerous process.  Also your bladder appears somewhat thickened and irregular on CT imaging which could be a chronic condition however we recommend you follow-up with urology for evaluation.  Please call the number provided to arrange a follow-up appointment within the next week or so.

## 2021-07-27 DIAGNOSIS — K5900 Constipation, unspecified: Secondary | ICD-10-CM | POA: Diagnosis not present

## 2021-07-27 MED ORDER — CEPHALEXIN 500 MG PO CAPS: 500.0000 mg | ORAL_CAPSULE | Freq: Four times a day (QID) | ORAL | 0 refills | Status: AC

## 2021-07-27 NOTE — ED Notes (Signed)
[facility] called to transport pt to [facility]

## 2021-07-27 NOTE — ED Notes (Signed)
This RN made multiple (8) attempts to call [facility] to give report on the patient without success. EMS made aware of this Rns attempts.

## 2021-07-31 LAB — URINE CULTURE: Culture: >=100000 — AB

## 2021-08-01 NOTE — Progress Notes (Signed)
ED Antimicrobial Stewardship Positive Culture Follow Up   [name]. is an 88 y.o. male who presented to [facility] on 07/26/2021 with a chief complaint of  Chief Complaint  Patient presents with   Bloated    Recent Results (from the past 720 hour(s))  Urine Culture     Status: Abnormal   Collection Time: 07/26/21 11:11 PM   Specimen: Urine, Clean Catch  Result Value Ref Range Status   Specimen Description   Final    URINE, CLEAN CATCH Performed at [facility], [address] [place], [place] [address]    Special Requests   Final    NONE Performed at [facility], [address], [place], [place] [address]    Culture (A)  Final    >=100,000 COLONIES/mL PROTEUS MIRABILIS 70,000 COLONIES/mL STAPHYLOCOCCUS HAEMOLYTICUS    Report Status 07/31/2021 FINAL  Final   Organism ID, Bacteria PROTEUS MIRABILIS (A)  Final   Organism ID, Bacteria STAPHYLOCOCCUS HAEMOLYTICUS (A)  Final      Susceptibility   Proteus mirabilis - MIC*    AMPICILLIN >=32 RESISTANT Resistant     CEFAZOLIN >=64 RESISTANT Resistant     CEFEPIME 16 RESISTANT Resistant     CEFTRIAXONE 4 RESISTANT Resistant     CIPROFLOXACIN >=4 RESISTANT Resistant     GENTAMICIN >=16 RESISTANT Resistant     IMIPENEM 2 SENSITIVE Sensitive     NITROFURANTOIN 128 RESISTANT Resistant     TRIMETH/SULFA >=320 RESISTANT Resistant     AMPICILLIN/SULBACTAM >=32 RESISTANT Resistant     PIP/TAZO <=4 SENSITIVE Sensitive     * >=100,000 COLONIES/mL PROTEUS MIRABILIS   Staphylococcus haemolyticus - MIC*    CIPROFLOXACIN <=0.5 SENSITIVE Sensitive     GENTAMICIN <=0.5 SENSITIVE Sensitive     NITROFURANTOIN <=16 SENSITIVE Sensitive     OXACILLIN <=0.25 SENSITIVE Sensitive     TETRACYCLINE <=1 SENSITIVE Sensitive     VANCOMYCIN <=0.5 SENSITIVE Sensitive     TRIMETH/SULFA <=10 SENSITIVE Sensitive     CLINDAMYCIN <=0.25 SENSITIVE Sensitive     RIFAMPIN <=0.5 SENSITIVE Sensitive     Inducible Clindamycin NEGATIVE  Sensitive     * 70,000 COLONIES/mL STAPHYLOCOCCUS HAEMOLYTICUS    '[x]'$  Patient sent from [facility] with bloating and concern for obstruction. No obstruction seen on CT.  Patient did not have any urinary complaints.  Spoke with EDP who saw patient on 6/2 when he presented to discuss culture.  Plan given to call facility to see how he is doing and possibly recheck urine culture.  Called [facility] and spoke to caregiver.  He stated NP saw patient today and prescribed nitrofurantoin.  I stated it was resistant (He also had copy of culture report).  I explained we recommended monitoring off antibiotic unless urinary symptoms or systemic signs of infection.  He planned to discuss with NP.   New antibiotic prescription: NA  ED Provider: Dr Robby Sermon, PharmD, BCPS, [place] [facility] Cell: [phone] 08/01/2021 1:05 PM

## 2021-08-15 ENCOUNTER — Encounter: Payer: Self-pay | Admitting: Infectious Diseases

## 2021-09-28 ENCOUNTER — Emergency Department
Admission: EM | Admit: 2021-09-28 | Discharge: 2021-09-28 | Disposition: A | Discharge status: Skilled Nursing Facility | Payer: Medicare Other | Attending: Emergency Medicine | Admitting: Emergency Medicine

## 2021-09-28 ENCOUNTER — Emergency Department: Payer: Medicare Other

## 2021-09-28 DIAGNOSIS — N3000 Acute cystitis without hematuria: Secondary | ICD-10-CM | POA: Insufficient documentation

## 2021-09-28 DIAGNOSIS — R109 Unspecified abdominal pain: Secondary | ICD-10-CM

## 2021-09-28 DIAGNOSIS — R052 Subacute cough: Secondary | ICD-10-CM | POA: Diagnosis not present

## 2021-09-28 DIAGNOSIS — R1013 Epigastric pain: Secondary | ICD-10-CM | POA: Diagnosis present

## 2021-09-28 LAB — CBC WITH DIFFERENTIAL/PLATELET
Abs Immature Granulocytes: 0.05 10*3/uL (ref 0.00–0.07)
Basophils Absolute: 0 10*3/uL (ref 0.0–0.1)
Basophils Relative: 0 %
Eosinophils Absolute: 0.2 10*3/uL (ref 0.0–0.5)
Eosinophils Relative: 2 %
HCT: 34.1 % — ABNORMAL LOW (ref 39.0–52.0)
Hemoglobin: 10.6 g/dL — ABNORMAL LOW (ref 13.0–17.0)
Immature Granulocytes: 1 %
Lymphocytes Relative: 24 %
Lymphs Abs: 2.3 10*3/uL (ref 0.7–4.0)
MCH: 28.9 pg (ref 26.0–34.0)
MCHC: 31.1 g/dL (ref 30.0–36.0)
MCV: 92.9 fL (ref 80.0–100.0)
Monocytes Absolute: 0.9 10*3/uL (ref 0.1–1.0)
Monocytes Relative: 10 %
Neutro Abs: 6 10*3/uL (ref 1.7–7.7)
Neutrophils Relative %: 63 %
Platelets: 206 10*3/uL (ref 150–400)
RBC: 3.67 MIL/uL — ABNORMAL LOW (ref 4.22–5.81)
RDW: 14.3 % (ref 11.5–15.5)
WBC: 9.5 10*3/uL (ref 4.0–10.5)
nRBC: 0 % (ref 0.0–0.2)

## 2021-09-28 LAB — COMPREHENSIVE METABOLIC PANEL
ALT: 11 U/L (ref 0–44)
AST: 16 U/L (ref 15–41)
Albumin: 3 g/dL — ABNORMAL LOW (ref 3.5–5.0)
Alkaline Phosphatase: 87 U/L (ref 38–126)
Anion gap: 3 — ABNORMAL LOW (ref 5–15)
BUN: 43 mg/dL — ABNORMAL HIGH (ref 8–23)
CO2: 25 mmol/L (ref 22–32)
Calcium: 8.2 mg/dL — ABNORMAL LOW (ref 8.9–10.3)
Chloride: 112 mmol/L — ABNORMAL HIGH (ref 98–111)
Creatinine, Ser: 1.89 mg/dL — ABNORMAL HIGH (ref 0.61–1.24)
GFR, Estimated: 34 mL/min — ABNORMAL LOW (ref >60)
Glucose, Bld: 162 mg/dL — ABNORMAL HIGH (ref 70–99)
Potassium: 4.3 mmol/L (ref 3.5–5.1)
Sodium: 140 mmol/L (ref 135–145)
Total Bilirubin: 0.5 mg/dL (ref 0.3–1.2)
Total Protein: 5.8 g/dL — ABNORMAL LOW (ref 6.5–8.1)

## 2021-09-28 LAB — URINALYSIS, ROUTINE W REFLEX MICROSCOPIC
Bilirubin Urine: NEGATIVE
Glucose, UA: NEGATIVE mg/dL
Ketones, ur: NEGATIVE mg/dL
Nitrite: NEGATIVE
Protein, ur: 30 mg/dL — AB
RBC / HPF: >50 RBC/hpf — ABNORMAL HIGH (ref 0–5)
Specific Gravity, Urine: 1.025 (ref 1.005–1.030)
Squamous Epithelial / HPF: NONE SEEN (ref 0–5)
WBC, UA: >50 WBC/hpf — ABNORMAL HIGH (ref 0–5)
pH: 5 (ref 5.0–8.0)

## 2021-09-28 LAB — LIPASE, BLOOD: Lipase: 30 U/L (ref 11–51)

## 2021-09-28 LAB — TROPONIN I (HIGH SENSITIVITY)
Troponin I (High Sensitivity): 7 ng/L (ref <18)
Troponin I (High Sensitivity): 8 ng/L (ref <18)

## 2021-09-28 MED ORDER — LIDOCAINE 5 % EX PTCH
1.0000 | MEDICATED_PATCH | Freq: Once | CUTANEOUS | Status: DC
  Administered 2021-09-28: 1 via TRANSDERMAL
  Filled 2021-09-28: qty 1

## 2021-09-28 MED ORDER — IOHEXOL 300 MG/ML  SOLN
75.0000 mL | Freq: Once | INTRAMUSCULAR | Status: AC | PRN
  Administered 2021-09-28: 75 mL via INTRAVENOUS

## 2021-09-28 MED ORDER — MORPHINE SULFATE (PF) 4 MG/ML IV SOLN
4.0000 mg | Freq: Once | INTRAVENOUS | Status: AC
  Administered 2021-09-28: 4 mg via INTRAVENOUS
  Filled 2021-09-28 (×2): qty 1

## 2021-09-28 MED ORDER — SODIUM CHLORIDE 0.9 % IV SOLN
1.0000 g | Freq: Once | INTRAVENOUS | Status: AC
  Administered 2021-09-28: 1 g via INTRAVENOUS
  Filled 2021-09-28: qty 10

## 2021-09-28 MED ORDER — LACTATED RINGERS IV BOLUS
1000.0000 mL | Freq: Once | INTRAVENOUS | Status: AC
  Administered 2021-09-28: 1000 mL via INTRAVENOUS

## 2021-09-28 MED ORDER — CEFDINIR 300 MG PO CAPS: 300.0000 mg | ORAL_CAPSULE | Freq: Two times a day (BID) | ORAL | 0 refills | Status: AC

## 2021-09-28 NOTE — ED Provider Notes (Signed)
[facility] Provider Note    Event Date/Time   First MD Initiated Contact with Patient 09/28/21 [phone]     (approximate)   History   Abdominal Pain (Felt a "POP" while coughing in his abd)   HPI  [name]. is a 88 y.o. male who presents to the ED for evaluation of Abdominal Pain (Felt a "POP" while coughing in his abd)   Patient presents to the ED for the ration of epigastric discomfort and a popping sensation after a coughing fit that occurred earlier today.  He resides at [facility] and reports he is currently being treated with doxycycline for pneumonia.  Doxycycline to start in the past 1 to 2 days, and his paperwork from his facility confirms this.  He reports no worsening of his respiratory status, chest pain or syncopal episodes.  Denies any hemoptysis.  Reports around 11:30 PM, just a couple hours prior to arrival, he develops a popping epigastric sensation and sharp pain to his epigastrium after a coughing fit.  No emesis, diarrhea, syncope or dizziness associated with this.  Physical Exam   Triage Vital Signs: ED Triage Vitals [09/28/21 0045]  Enc Vitals Group     BP 98/60     Pulse Rate 89     Resp 20     Temp 98.5 F (36.9 C)     Temp Source Oral     SpO2 94 %     Weight 224 lb 13.9 oz (102 kg)     Height '5\' 8"'$  (1.727 m)     Head Circumference      Peak Flow      Pain Score 6     Pain Loc      Pain Edu?      Excl. in [place]?     Most recent vital signs: Vitals:   09/28/21 0430 09/28/21 0500  BP: 132/60 (!) 144/62  Pulse: 68 69  Resp:  (!) 25  Temp:    SpO2: 99% 100%    General: Awake, no distress.  CV:  Good peripheral perfusion.  Resp:  Normal effort.  Abd:  No distention.  Epigastric discomfort with palpation without overlying skin changes or signs of trauma. Suprapubic TTP is also present. No peritoneal features throughout.  MSK:  No deformity noted.  No tenderness or skin changes to bilateral rib cage  palpation Neuro:  No focal deficits appreciated. Other:     ED Results / Procedures / Treatments   Labs (all labs ordered are listed, but only abnormal results are displayed) Labs Reviewed  CBC WITH DIFFERENTIAL/PLATELET - Abnormal; Notable for the following components:      Result Value   RBC 3.67 (*)    Hemoglobin 10.6 (*)    HCT 34.1 (*)    All other components within normal limits  COMPREHENSIVE METABOLIC PANEL - Abnormal; Notable for the following components:   Chloride 112 (*)    Glucose, Bld 162 (*)    BUN 43 (*)    Creatinine, Ser 1.89 (*)    Calcium 8.2 (*)    Total Protein 5.8 (*)    Albumin 3.0 (*)    GFR, Estimated 34 (*)    Anion gap 3 (*)    All other components within normal limits  URINALYSIS, ROUTINE W REFLEX MICROSCOPIC - Abnormal; Notable for the following components:   Color, Urine YELLOW (*)    APPearance CLOUDY (*)    Hgb urine dipstick LARGE (*)  Protein, ur 30 (*)    Leukocytes,Ua LARGE (*)    RBC / HPF >50 (*)    WBC, UA >50 (*)    Bacteria, UA RARE (*)    All other components within normal limits  URINE CULTURE  LIPASE, BLOOD  TROPONIN I (HIGH SENSITIVITY)  TROPONIN I (HIGH SENSITIVITY)    EKG Very poor quality EKG with tremulous baseline demonstrates a narrow complex rhythm with a rate of about 90 bpm.  No clear STEMI.  RADIOLOGY CXR interpreted by me without evidence of acute cardiopulmonary pathology. CT abdomen/pelvis interpreted by me with bladder wall thickening  Official radiology report(s): DG Chest Portable 1 View  Result Date: 09/28/2021 CLINICAL DATA:  Cough, epigastric pain EXAM: PORTABLE CHEST 1 VIEW COMPARISON:  03/02/2020 FINDINGS: Lung volumes are small, but are symmetric. Opacity seen at the left lung base laterally represents prominent epicardial fat better seen on concurrently performed CT examination of the abdomen and pelvis. No focal consolidation. No pneumothorax or pleural effusion. Coronary artery bypass grafting  has been performed. Cardiac size is within normal limits when accounting for poor pulmonary insufflation. Pulmonary vascularity is normal. No acute bone abnormality. IMPRESSION: Pulmonary hypoinflation. Electronically Signed   By: Fidela Salisbury M.D.   On: 09/28/2021 04:37   CT ABDOMEN PELVIS W CONTRAST  Result Date: 09/28/2021 CLINICAL DATA:  Epigastric abdominal pain EXAM: CT ABDOMEN AND PELVIS WITH CONTRAST TECHNIQUE: Multidetector CT imaging of the abdomen and pelvis was performed using the standard protocol following bolus administration of intravenous contrast. RADIATION DOSE REDUCTION: This exam was performed according to the departmental dose-optimization program which includes automated exposure control, adjustment of the mA and/or kV according to patient size and/or use of iterative reconstruction technique. CONTRAST:  83m OMNIPAQUE IOHEXOL 300 MG/ML  SOLN COMPARISON:  None Available. FINDINGS: Lower chest: Mild bibasilar bronchiectasis. No superimposed focal pulmonary infiltrate. Coronary artery bypass grafting has been performed. Global cardiac size within normal limits. Hepatobiliary: No focal liver abnormality is seen. No gallstones, gallbladder wall thickening, or biliary dilatation. Pancreas: Unremarkable Spleen: Unremarkable Adrenals/Urinary Tract: The adrenal glands are unremarkable. The kidneys are normal in position. There is moderate asymmetric left renal cortical atrophy, stable since prior examination. 2.8 cm exophytic low-attenuation lesion arises from the interpolar region of the left kidney and, while technically indeterminate on this examination, appears smaller than on remote prior examination of 12/21/2006 and is safely considered benign, likely representing a partially involuted cyst. No further follow-up is recommended for this lesion. No hydronephrosis. No intrarenal or ureteral calculi. Marked bladder wall thickening is again identified suggesting changes of a chronic bladder  outlet obstruction and/or superimposed diffuse infectious or inflammatory cystitis. The bladder is not distended. Stomach/Bowel: The stomach, small bowel, and large bowel are unremarkable. Appendix absent. No free intraperitoneal gas or fluid. Vascular/Lymphatic: Extensive aortoiliac atherosclerotic calcification. No aortic aneurysm. No pathologic adenopathy within the abdomen and pelvis. Reproductive: Prostate is unremarkable. Other: Tiny fat containing umbilical hernia.  Rectum unremarkable. Musculoskeletal: No acute bone abnormality. No lytic or blastic bone lesions. Degenerative changes are seen within the lumbar spine. L4-5 cauda fusion noted. Fatty atrophy of the a skeletal musculature is noted involving the retroperitoneum and gluteal musculature bilaterally. IMPRESSION: 1. No acute intra-abdominal pathology identified. No definite radiographic explanation for the patient's reported symptoms. 2. Marked bladder wall thickening suggesting changes of chronic bladder outlet obstruction and/or diffuse infectious or inflammatory cystitis. Correlation with urinalysis and urine culture may be helpful. 3. Moderate asymmetric left renal cortical atrophy, stable since prior  examination. 4.  Aortic Atherosclerosis (ICD10-I70.0). Electronically Signed   By: Fidela Salisbury M.D.   On: 09/28/2021 03:04    PROCEDURES and INTERVENTIONS:  .1-3 Lead EKG Interpretation  Performed by: Vladimir Crofts, MD Authorized by: Vladimir Crofts, MD     Interpretation: normal     ECG rate:  72   ECG rate assessment: normal     Rhythm: sinus rhythm     Ectopy: none     Conduction: normal     Medications  cefTRIAXone (ROCEPHIN) 1 g in sodium chloride 0.9 % 100 mL IVPB (1 g Intravenous New Bag/Given 09/28/21 0613)  lidocaine (LIDODERM) 5 % 1 patch (1 patch Transdermal Patch Applied 09/28/21 0614)  morphine (PF) 4 MG/ML injection 4 mg (4 mg Intravenous Given 09/28/21 0243)  iohexol (OMNIPAQUE) 300 MG/ML solution 75 mL (75 mLs Intravenous  Contrast Given 09/28/21 0218)  lactated ringers bolus 1,000 mL (0 mLs Intravenous Stopped 09/28/21 0526)     IMPRESSION / MDM / ASSESSMENT AND PLAN / ED COURSE  I reviewed the triage vital signs and the nursing notes.  Differential diagnosis includes, but is not limited to, pneumothorax, pancreatitis, sepsis, gallstone  {Patient presents with symptoms of an acute illness or injury that is potentially life-threatening.  88 year old male being treated for pneumonia with doxycycline presents to the ED with an episode of epigastric pain after a coughing fit, found have evidence of acute cystitis requiring adjustments of his antibiotics, but ultimately suitable for return to his facility and outpatient management.  Has some mild epigastric tenderness that does improve the lidocaine patch, as well as some suprapubic tenderness.  No peritoneal features.  No signs of sepsis.  Blood work with no leukocytosis.  CKD around baseline is noted on his metabolic panel.  Lipase and 2 troponins are negative.  Catheterized urine with large leukocytes and quite concerning for infection, will be sent for culture we will transition his antibiotic from doxycycline to a third-generation cephalosporin.  I considered observation admission for this patient, but I believe he would be suitable for return to his facility.  We will discharge with a week of cefdinir and recommend they stop doxycycline.  Clinical Course as of 09/28/21 6314  Sat Sep 28, 2021  0400 Reassessed.  Patient ports feeling a bit better.  We discussed CT results and my recommendation for catheterized urine and he is agreeable. [DS]  0608 Update the patient of signs of cystitis requiring adjustments of his antibiotics. [DS]    Clinical Course User Index [DS] Vladimir Crofts, MD     FINAL CLINICAL IMPRESSION(S) / ED DIAGNOSES   Final diagnoses:  Abdominal pain, unspecified abdominal location  Acute cystitis without hematuria  Subacute cough     Rx /  DC Orders   ED Discharge Orders          Ordered    cefdinir (OMNICEF) 300 MG capsule  2 times daily        09/28/21 0600             Note:  This document was prepared using Dragon voice recognition software and may include unintentional dictation errors.   Vladimir Crofts, MD 09/28/21 [phone]

## 2021-09-28 NOTE — ED Notes (Signed)
ED Provider at bedside. 

## 2021-09-28 NOTE — ED Notes (Signed)
Called [facility] for transport back to [facility]

## 2021-09-28 NOTE — ED Notes (Signed)
Report called to [facility]. Spoke with [employer].

## 2021-09-28 NOTE — ED Triage Notes (Signed)
Pt has been receiving tx for PNA and sts while he was coughing he felt a "pop" in his abd.

## 2021-09-28 NOTE — Discharge Instructions (Signed)
[name] has signs of a UTI/bladder infection. I would recommend stopping his doxycycline and switching his antibiotics to cefdinir/Omnicef - this would be better at covering both a pneumonia and UTI at the same time.   Continue all of his other medications.   Return to the ED with any other concerns or worsening symptoms.

## 2021-09-28 NOTE — ED Notes (Signed)
Pt unable to sign signing pad. Report and DC instructions givne to RN at [facility].

## 2021-09-30 LAB — URINE CULTURE: Culture: >=100000 — AB

## 2021-10-01 ENCOUNTER — Ambulatory Visit (INDEPENDENT_AMBULATORY_CARE_PROVIDER_SITE_OTHER): Payer: Medicare Other | Admitting: Pulmonary Disease

## 2021-10-01 ENCOUNTER — Encounter: Payer: Self-pay | Admitting: Pulmonary Disease

## 2021-10-01 VITALS — BP 126/74 | HR 77 | Temp 97.7°F | Ht 68.0 in | Wt 211.0 lb

## 2021-10-01 DIAGNOSIS — Z89611 Acquired absence of right leg above knee: Secondary | ICD-10-CM

## 2021-10-01 DIAGNOSIS — N183 Chronic kidney disease, stage 3 unspecified: Secondary | ICD-10-CM | POA: Diagnosis not present

## 2021-10-01 DIAGNOSIS — J479 Bronchiectasis, uncomplicated: Secondary | ICD-10-CM | POA: Diagnosis not present

## 2021-10-01 DIAGNOSIS — J45901 Unspecified asthma with (acute) exacerbation: Secondary | ICD-10-CM | POA: Diagnosis not present

## 2021-10-01 MED ORDER — TRELEGY ELLIPTA 100-62.5-25 MCG/ACT IN AEPB
1.0000 | INHALATION_SPRAY | Freq: Every day | RESPIRATORY_TRACT | 11 refills | Status: AC
Start: 2021-10-01 — End: ?

## 2021-10-01 MED ORDER — ALBUTEROL SULFATE HFA 108 (90 BASE) MCG/ACT IN AERS: 2.0000 | INHALATION_SPRAY | Freq: Four times a day (QID) | RESPIRATORY_TRACT | 6 refills | Status: AC | PRN

## 2021-10-01 MED ORDER — TRELEGY ELLIPTA 100-62.5-25 MCG/ACT IN AEPB: 1.0000 | INHALATION_SPRAY | Freq: Every day | RESPIRATORY_TRACT | 0 refills | Status: DC

## 2021-10-01 NOTE — Patient Instructions (Signed)
You have an exacerbation of asthmatic bronchitis  We are giving you an inhaler called Trelegy Ellipta this is 1 puff daily.  Make sure you rinse your mouth well after you use it.  You will get another inhaler called Ventolin or albuterol, this will be 2 puffs up to 4 times a day as needed for cough, wheezing or shortness of breath.  This is only a rescue inhaler and again only as needed.  We will see him in follow-up in 3 months time call sooner should any new problems arise.

## 2021-10-01 NOTE — Progress Notes (Signed)
Subjective:    Patient ID: [name]., male    DOB: 17-Jan-1931, 88 y.o.   MRN: [id number] Patient Care Team: [facility], [facility] as PCP - General (General Practice) Kathie Rhodes, MD (Inactive) as Consulting Physician (Urology) Larey Dresser, MD as Consulting Physician (Cardiology) Steffanie Rainwater, [place] as Consulting Physician (Podiatry) Ralene Muskrat as Physician Assistant (Chiropractic Medicine) Norma Fredrickson, MD as Consulting Physician (Psychiatry) Fran Lowes, MD (Inactive) as Consulting Physician (Nephrology) Mikey Bussing, DDS (Dentistry) Ricard Dillon, MD as Consulting Physician (Internal Medicine) Marygrace Drought, MD as Consulting Physician (Ophthalmology) Harriett Sine, MD as Consulting Physician (Dermatology) Jerline Pain, MD as Consulting Physician (Cardiology)  Chief Complaint  Patient presents with   pulmonary consult    CT 2021. Prod cough with white sputum and wheezing.    HPI Patient is an 88 year old former smoker (40 pack years total) who presents for evaluation of a "spot in his lung".  The patient has mild dementia, he lives in a skilled nursing facility and is brought in by caretaker.  He does not know anything about a "spot on the lung" on my review of his records it appears that he had a CT chest in January 2022 that shows some groundglass opacities during an episode of pneumonia.  A most recent CT of the abdomen and pelvis showed no groundglass abnormalities on the lower lobes but he did have some bronchiectatic changes.  Over the last 3 weeks the patient has been noted to have a congested cough according to the caretaker.  States that he is "not bothered by it" but has had sputum production.  There is no documented fevers, chills or sweats.  A recent chest x-ray performed on 5 August shows that there is no consolidation or masses.  He did have some poor inspiratory effort.  Patient has not had any chest pain.  The patient does  endorse smoking in the past 2 packs of cigarettes per day for 20 years quit about 42 years ago.  He served 26 years in the [employer] and served during the [place] era and did not see combat.  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  No other history can be gleaned.  Review of Systems A 10 point review of systems was performed and it is as noted above otherwise negative.  Past Medical History:  Diagnosis Date   Agent orange exposure 1966 or 1971   Anemia    Anxiety    Arthritis    "hands and back" (01/20/2013)   CAD (coronary artery disease)    native vessel   Carotid artery disease (HCC)    nonobstructive   Cataract    Cecal diverticulitis 2008   drained   Cellulitis, gluteal    bilateral for the past 6 months/notes 01/20/2013   Chronic lower back pain    Chronic renal insufficiency    Depression    Diabetes mellitus type II    Exertional shortness of breath    HTN (hypertension)    Hyperlipidemia    Hypokalemia    Malnutrition (HCC)    protein-calorie   Myocardial infarction ([place])    "silent; before OHS" (01/20/2013)   PTSD (post-traumatic stress disorder)    Spina bifida ([place])    Urinary incontinence    Past Surgical History:  Procedure Laterality Date   APPLICATION OF WOUND VAC Right 04/08/2019   Procedure: APPLICATION OF WOUND VAC;  Surgeon: Edrick Kins, DPM;  Location: [facility];  Service:  Podiatry;  Laterality: Right;   BONE BIOPSY Right 04/08/2019   Procedure: BONE BIOPSY;  Surgeon: Edrick Kins, DPM;  Location: [facility];  Service: Podiatry;  Laterality: Right;   CARDIAC CATHETERIZATION  1998   "couple before my OHS" (01/20/2013)   CARPAL TUNNEL RELEASE Right 1980's   CATARACT EXTRACTION W/ INTRAOCULAR LENS  IMPLANT, BILATERAL Bilateral ~ 2010   [place]   "CABG X4" (01/20/2013)   CYSTECTOMY  2000's   "cytal cyst on my intestines; probed then drained it; hospitalized for 13 days; NPO" (01/20/2013)    EYE SURGERY     HEMORRHOID BANDING  ~ 10/2012   IR FLUORO GUIDE CV LINE RIGHT  04/01/2019   IR US GUIDE VASC ACCESS RIGHT  04/01/2019   KNEE SURGERY Left 1964   "exploratory; sewed it up w/wire" (01/20/2013)   LOWER EXTREMITY ANGIOGRAPHY Right 03/22/2019   Procedure: LOWER EXTREMITY ANGIOGRAPHY;  Surgeon: Katha Cabal, MD;  Location: [facility];  Service: Cardiovascular;  Laterality: Right;   LOWER EXTREMITY ANGIOGRAPHY Left 03/29/2019   Procedure: LOWER EXTREMITY ANGIOGRAPHY;  Surgeon: Katha Cabal, MD;  Location: [facility];  Service: Cardiovascular;  Laterality: Left;   PROSTATE SURGERY     SPINE SURGERY  11/24/1932   "Spina bifida surgery"   STRABISMUS SURGERY Bilateral 03/29/2015   Procedure: REPAIR STRABISMUS BILATERAL;  Surgeon: Lamonte Sakai, MD;  Location: [facility];  Service: Ophthalmology;  Laterality: Bilateral;   WOUND DEBRIDEMENT Right 04/08/2019   Procedure: DEBRIDEMENT WOUND;  Surgeon: Edrick Kins, DPM;  Location: [facility];  Service: Podiatry;  Laterality: Right;   Patient Active Problem List   Diagnosis Date Noted   Bilateral pneumonia 03/03/2020   CKD (chronic kidney disease), stage III ([place]) 03/02/2020   Acute pyelonephritis 03/02/2020   S/P AKA (above knee amputation), right ([place]) 03/02/2020   Sepsis ([place]) 03/02/2020   Pyelonephritis 03/02/2020   PVD (peripheral vascular disease) ([place]) 03/25/2019   Osteomyelitis of right foot ([place]) 03/25/2019   Frequency of urination and polyuria 03/25/2019   Atherosclerosis of native arteries of the extremities with ulceration ([place]) 03/14/2019   COVID-19 virus infection 03/14/2019   Sacral pressure sore 11/25/2018   Arterial hemorrhage 11/18/2018   Hypoglycemia associated with type 2 diabetes mellitus ([place]) 11/18/2018   General weakness 09/22/2016   Bilateral carpal tunnel syndrome 09/22/2016   Leukocytosis 07/30/2015   Urinary incontinence 12/13/2012   Chronic  constipation [id number]   Metabolic syndrome [id number]   Spina bifida ([place]) 11/18/2012   CAD (coronary artery disease) 03/08/2009   Type 2 diabetes mellitus with diabetic nephropathy ([place]) 10/29/2008   Type 2 diabetes mellitus with stage 3 chronic kidney disease, without long-term current use of insulin ([place]) 10/29/2008   PTSD 10/29/2008   Essential hypertension 10/29/2008   Family History  Problem Relation Age of Onset   Heart disease Mother    Heart attack Mother    Heart disease Father    Heart attack Father    Cancer Sister        breast   Cancer Sister        lung and ovarian   Allergies  Allergen Reactions   Sulfonamide Derivatives     REACTION: pruitis patient cant remember its been so long     Aspirin Rash    In high doses   Nitrofurantoin Rash   Penicillins Rash    Did it involve swelling of the face/tongue/throat, SOB,  or low BP? No Did it involve sudden or severe rash/hives, skin peeling, or any reaction on the inside of your mouth or nose? No Did you need to seek medical attention at a hospital or doctor's office? No When did it last happen?      Teenager If all above answers are "NO", may proceed with cephalosporin use.   Sulfa Antibiotics Rash   Current Meds  Medication Sig   12 HOUR NASAL DECONGESTANT 0.05 % nasal spray Place 2 sprays into both nostrils 2 (two) times daily. For 3 days   ARTIFICIAL TEARS 0.2-0.2-1 % SOLN Apply to eye 3 (three) times daily. Instill 2 drops into each eye 3 times daily.   aspirin 81 MG tablet Take 81 mg by mouth daily.   buPROPion (WELLBUTRIN XL) 150 MG 24 hr tablet Take 150 mg by mouth daily.   Cadexomer Iodine (IODOFLEX) 0.9 % PADS Apply 1 application topically See admin instructions. Twice a week heel   cefdinir (OMNICEF) 300 MG capsule Take 1 capsule (300 mg total) by mouth 2 (two) times daily for 7 days.   cefpodoxime (VANTIN) 200 MG tablet Take 1 tablet (200 mg total) by mouth 2 (two) times daily.   clopidogrel (PLAVIX)  75 MG tablet Take 1 tablet (75 mg total) by mouth daily.   Dextromethorphan-guaiFENesin (MUCINEX DM) 30-600 MG TB12 Take 1 tablet by mouth 2 (two) times daily. For 5 days   docusate sodium (COLACE) 100 MG capsule Take 100 mg by mouth at bedtime.   ezetimibe (ZETIA) 10 MG tablet Take 10 mg by mouth at bedtime.   GENERLAC 10 GM/15ML SOLN Take 30 mLs by mouth every Monday, Wednesday, and Friday.   melatonin 5 MG TABS Take 5 mg by mouth at bedtime.   metoprolol tartrate (LOPRESSOR) 25 MG tablet Take 0.5 tablets (12.5 mg total) by mouth 2 (two) times daily.   Multiple Vitamins-Minerals (PRESERVISION AREDS 2 PO) Take 1 capsule by mouth 2 (two) times daily.   nystatin (MYCOSTATIN/NYSTOP) powder Apply topically 2 (two) times daily. Apply to groin and perineum twice a day   omeprazole (PRILOSEC) 20 MG capsule Take 20 mg by mouth daily.   polyethylene glycol powder (GLYCOLAX/MIRALAX) 17 GM/SCOOP powder Take 17 g by mouth daily.   senna (SENOKOT) 8.6 MG tablet Take 2 tablets by mouth 2 (two) times daily.   simethicone (GAS-X) 80 MG chewable tablet Chew 1 tablet (80 mg total) by mouth 4 (four) times daily as needed for flatulence.   simvastatin (ZOCOR) 20 MG tablet Take 20 mg by mouth at bedtime.   sitaGLIPtin-metformin (JANUMET) 50-500 MG tablet Take 1 tablet by mouth daily. with food   Immunization History  Administered Date(s) Administered   Influenza, High Dose Seasonal PF 12/13/2015, 01/23/2017   Influenza,inj,Quad PF,6+ Mos 11/18/2012, 12/23/2013, 01/31/2015, 11/25/2018   Influenza-Unspecified 11/10/2018, 12/13/2019   Pneumococcal Conjugate-13 03/08/2013   Pneumococcal Polysaccharide-23 11/16/2014, 11/25/2018   Td 11/26/2010   Tdap 11/26/2010   Zoster Recombinat (Shingrix) 08/18/2019, 10/28/2019       Objective:   Physical Exam BP 126/74 (BP Location: Right Arm, Cuff Size: Large)   Pulse 77   Temp 97.7 F (36.5 C) (Temporal)   Ht 5' 8"$  (1.727 m)   Wt 211 lb (95.7 kg) Comment: unable to  weight  SpO2 95%   BMI 32.08 kg/m  GENERAL: Obese elderly gentleman, presents in wheelchair.  Nonambulatory due to AKA on the right.  He is awake, alert, oriented x3.  No conversational dyspnea. HEAD: Normocephalic,  atraumatic.  EYES: Pupils equal, round, reactive to light.  No scleral icterus.  MOUTH: Normal wear of teeth, oral mucosa moist.  No thrush. NECK: Supple. No thyromegaly. Trachea midline. No JVD.  No adenopathy. PULMONARY: Good air entry bilaterally.  Rhonchi and wheezes throughout. CARDIOVASCULAR: S1 and S2. Regular rate and rhythm.  ABDOMEN: Obese, otherwise benign. MUSCULOSKELETAL: AKA amputation, right, no clubbing, no edema.  Osteoarthritis changes on the hands. NEUROLOGIC: Grossly nonfocal. SKIN: Intact,warm,dry. PSYCH: Jovial mood, normal behavior.       Assessment & Plan:     ICD-10-CM   1. Asthmatic bronchitis with acute exacerbation, unspecified asthma severity, unspecified whether persistent  J45.901    Patient has an exacerbation of asthmatic bronchitis Will treat with Trelegy Ellipta 100,1 puff daily Albuterol as needed No infectious component    2. Bronchiectasis without complication ([place])  [id number]    Management as above Pulmonary hygiene    3. Stage 3 chronic kidney disease, unspecified whether stage 3a or 3b CKD (HCC)  N18.30    Issue adds complexity to his management Dose medications accordingly    4. S/P AKA (above knee amputation), right ([place])  Z89.611      Meds ordered this encounter  Medications   Fluticasone-Umeclidin-Vilant (TRELEGY ELLIPTA) 100-62.5-25 MCG/ACT AEPB    Sig: Inhale 1 puff into the lungs daily.    Dispense:  14 each    Refill:  0    Order Specific Question:   Lot Number?    Answer:   Jiles Crocker    Order Specific Question:   Expiration Date?    Answer:   02/25/2023    Order Specific Question:   Manufacturer?    Answer:   [employer] [12]    Order Specific Question:   Quantity    Answer:   1   Fluticasone-Umeclidin-Vilant  (TRELEGY ELLIPTA) 100-62.5-25 MCG/ACT AEPB    Sig: Inhale 1 puff into the lungs daily.    Dispense:  60 each    Refill:  11   albuterol (VENTOLIN HFA) 108 (90 Base) MCG/ACT inhaler    Sig: Inhale 2 puffs into the lungs every 6 (six) hours as needed for wheezing or shortness of breath.    Dispense:  18 g    Refill:  6   See the patient in follow-up in 3 months time call sooner should any new problems arise.  Renold Don, MD Advanced Bronchoscopy PCCM [facility]    *This note was dictated using voice recognition software/Dragon.  Despite best efforts to proofread, errors can occur which can change the meaning. Any transcriptional errors that result from this process are unintentional and may not be fully corrected at the time of dictation.

## 2021-12-23 ENCOUNTER — Encounter (INDEPENDENT_AMBULATORY_CARE_PROVIDER_SITE_OTHER): Payer: Self-pay

## 2022-01-02 ENCOUNTER — Encounter: Payer: Self-pay | Admitting: Pulmonary Disease

## 2022-01-02 ENCOUNTER — Ambulatory Visit (INDEPENDENT_AMBULATORY_CARE_PROVIDER_SITE_OTHER): Payer: Medicare Other | Admitting: Pulmonary Disease

## 2022-01-02 VITALS — BP 120/78 | HR 76 | Temp 97.8°F | Ht 68.0 in | Wt 211.0 lb

## 2022-01-02 DIAGNOSIS — J45901 Unspecified asthma with (acute) exacerbation: Secondary | ICD-10-CM

## 2022-01-02 DIAGNOSIS — J479 Bronchiectasis, uncomplicated: Secondary | ICD-10-CM | POA: Diagnosis not present

## 2022-01-02 NOTE — Patient Instructions (Signed)
Continue using Trelegy 100, 1 inhalation daily.  Continue using as needed albuterol.  We will see you in follow-up in 4 months time call sooner should any new problems arise.

## 2022-01-02 NOTE — Progress Notes (Addendum)
Subjective:    Patient ID: Bobby Gunning., male    DOB: October 29, 1930, 88 y.o.   MRN: [id number] Patient Care Team: [facility] as PCP - General (General Practice) Laurey Morale, MD as Consulting Physician (Cardiology) Adam Phenix, DPM as Consulting Physician (Podiatry) Moody Bruins as Physician Assistant (Chiropractic Medicine) Archer Asa, MD as Consulting Physician (Psychiatry) Filbert Berthold, DDS (Dentistry) Janet Berlin, MD as Consulting Physician (Ophthalmology) Aris Lot, MD as Consulting Physician (Dermatology) Jake Bathe, MD as Consulting Physician (Cardiology) Salena Saner, MD as Consulting Physician (Pulmonary Disease)  Chief Complaint  Patient presents with   Follow-up    No SOB. Occasional wheezing. Cough with sputum.    HPI Patient is an 88 year old former smoker (40 pack years total) who presents for follow-up on his initial evaluation here on 01 October 2021.  At that time he thought he had a "spot in his lung".  The patient has mild dementia, he lives in a skilled nursing facility and is brought in by caretaker.  HE had a CT chest in January 2022 that shows some groundglass opacities during an episode of pneumonia.  A CT of the abdomen and pelvis on 5 August showed no groundglass abnormalities on the lower lobes but he did have some bronchiectatic changes.  Chest x-ray performed that day showed no evidence of infiltrates in his lungs just decreased lung volumes due to poor inspiratory effort.  Wearing his initial visit here it was postulated he had asthmatic bronchitis and was given a trial of Trelegy Ellipta.  He has been doing well with this medication according to the caretaker who brings him today.  Sputum production has decreased since he has been on the Trelegy.  There are no documented fevers, chills or sweats.  Patient does not endorse any other symptomatology.  As noted he has mild cognitive impairment (early dementia)  and appears to minimize symptoms.  It appears that the area that was believed to be a "spot in his lung" and described as a lung mass is due to a prominent epicardial fat.  This however is not a lung mass and does not require further intervention.  Review of Systems A 10 point review of systems was performed and it is as noted above otherwise negative.  Patient Active Problem List   Diagnosis Date Noted   COPD with acute exacerbation (HCC) 02/18/2022   Influenza A 02/17/2022   Bilateral pneumonia 03/03/2020   Stage 3b chronic kidney disease (CKD) (HCC) 03/02/2020   Acute pyelonephritis 03/02/2020   S/P AKA (above knee amputation), right (HCC) 03/02/2020   Sepsis (HCC) 03/02/2020   Pyelonephritis 03/02/2020   PVD (peripheral vascular disease) (HCC) 03/25/2019   Osteomyelitis of right foot (HCC) 03/25/2019   Frequency of urination and polyuria 03/25/2019   Atherosclerosis of native arteries of the extremities with ulceration (HCC) 03/14/2019   COVID-19 virus infection 03/14/2019   Sacral pressure sore 11/25/2018   Arterial hemorrhage 11/18/2018   Hypoglycemia associated with type 2 diabetes mellitus (HCC) 11/18/2018   General weakness 09/22/2016   Bilateral carpal tunnel syndrome 09/22/2016   Leukocytosis 07/30/2015   Urinary incontinence 12/13/2012   Chronic constipation 11/29/2012   Metabolic syndrome 11/29/2012   Spina bifida (HCC) 11/18/2012   CAD (coronary artery disease) 03/08/2009   Type 2 diabetes mellitus with diabetic nephropathy (HCC) 10/29/2008   Type 2 diabetes mellitus with stage 3 chronic kidney disease, without long-term current use of insulin (HCC) 10/29/2008   PTSD 10/29/2008  Essential hypertension 10/29/2008   Social History   Tobacco Use   Smoking status: Former    Packs/day: 2.00    Years: 20.00    Additional pack years: 0.00    Total pack years: 40.00    Types: Cigarettes    Start date: 11/20/1948    Quit date: 08/19/1980    Years since quitting:  41.9   Smokeless tobacco: Former    Types: Chew   Tobacco comments:    01/20/2013 "quit chewing 20 yr ago"  Substance Use Topics   Alcohol use: No    Comment: 01/20/2013 "quit drinking 08/19/1980"   Allergies  Allergen Reactions   Sulfonamide Derivatives     REACTION: pruitis patient cant remember its been so long     Aspirin Rash    In high doses   Nitrofurantoin Rash   Penicillins Rash    Did it involve swelling of the face/tongue/throat, SOB, or low BP? No Did it involve sudden or severe rash/hives, skin peeling, or any reaction on the inside of your mouth or nose? No Did you need to seek medical attention at a hospital or doctor's office? No When did it last happen?      Teenager If all above answers are "NO", may proceed with cephalosporin use.   Sulfa Antibiotics Rash   Current Meds  Medication Sig   12 HOUR NASAL DECONGESTANT 0.05 % nasal spray Place 2 sprays into both nostrils 2 (two) times daily. For 3 days   albuterol (VENTOLIN HFA) 108 (90 Base) MCG/ACT inhaler Inhale 2 puffs into the lungs every 6 (six) hours as needed for wheezing or shortness of breath.   ARTIFICIAL TEARS 0.2-0.2-1 % SOLN Apply to eye 3 (three) times daily. Instill 2 drops into each eye 3 times daily.   aspirin 81 MG tablet Take 81 mg by mouth daily.   buPROPion (WELLBUTRIN XL) 150 MG 24 hr tablet Take 150 mg by mouth daily.   Cadexomer Iodine (IODOFLEX) 0.9 % PADS Apply 1 application topically See admin instructions. Twice a week heel (Patient not taking: Reported on 02/17/2022)   clopidogrel (PLAVIX) 75 MG tablet Take 1 tablet (75 mg total) by mouth daily.   Dextromethorphan-guaiFENesin (MUCINEX DM) 30-600 MG TB12 Take 1 tablet by mouth 2 (two) times daily. For 5 days   docusate sodium (COLACE) 100 MG capsule Take 100 mg by mouth at bedtime.   ezetimibe (ZETIA) 10 MG tablet Take 10 mg by mouth at bedtime.   Fluticasone-Umeclidin-Vilant (TRELEGY ELLIPTA) 100-62.5-25 MCG/ACT AEPB Inhale 1 puff into  the lungs daily.   ipratropium-albuterol (DUONEB) 0.5-2.5 (3) MG/3ML SOLN 1 NEBULIZER SOLUTION ( ) BY ORAL INHALATION EVERY 4 TO 6 HOURS AS NEEDED   melatonin 5 MG TABS Take 5 mg by mouth at bedtime.   metoprolol tartrate (LOPRESSOR) 25 MG tablet Take 0.5 tablets (12.5 mg total) by mouth 2 (two) times daily.   Multiple Vitamins-Minerals (PRESERVISION AREDS 2 PO) Take 1 capsule by mouth 2 (two) times daily.   polyethylene glycol powder (GLYCOLAX/MIRALAX) 17 GM/SCOOP powder Take 17 g by mouth daily.   pregabalin (LYRICA) 25 MG capsule Take 25 mg by mouth as needed. (Patient not taking: Reported on 02/17/2022)   pregabalin (LYRICA) 50 MG capsule Take 50 mg by mouth 2 (two) times daily.   senna (SENOKOT) 8.6 MG tablet Take 2 tablets by mouth 2 (two) times daily.   simethicone (GAS-X) 80 MG chewable tablet Chew 1 tablet (80 mg total) by mouth 4 (four) times daily as  needed for flatulence.   simvastatin (ZOCOR) 20 MG tablet Take 20 mg by mouth at bedtime.   sitaGLIPtin (JANUVIA) 50 MG tablet Take 50 mg by mouth daily.   [DISCONTINUED] Fluticasone-Umeclidin-Vilant (TRELEGY ELLIPTA) 100-62.5-25 MCG/ACT AEPB Inhale 1 puff into the lungs daily.   Immunization History  Administered Date(s) Administered   Influenza, High Dose Seasonal PF 12/13/2015, 01/23/2017   Influenza,inj,Quad PF,6+ Mos 11/18/2012, 12/23/2013, 01/31/2015, 11/25/2018   Influenza-Unspecified 11/10/2018, 12/13/2019   Pneumococcal Conjugate-13 03/08/2013   Pneumococcal Polysaccharide-23 11/16/2014, 11/25/2018   Td 11/26/2010   Tdap 11/26/2010   Zoster Recombinat (Shingrix) 08/18/2019, 10/28/2019       Objective:   Physical Exam BP 120/78 (BP Location: Left Arm, Cuff Size: Normal)   Pulse 76   Temp 97.8 F (36.6 C)   Ht 5\' 8"  (1.727 m)   Wt 211 lb (95.7 kg) Comment: Last recorded weight. in a wheelchair  SpO2 100%   BMI 32.08 kg/m   SpO2: 100 % O2 Device: None (Room air)  GENERAL: Obese elderly gentleman, presents in  wheelchair. Nonambulatory due to AKA on the right. He is awake, alert, conversant. No conversational dyspnea. HEAD: Normocephalic, atraumatic.  EYES: Pupils equal, round, reactive to light. No scleral icterus.  MOUTH: Normal wear of teeth, oral mucosa moist. No thrush. NECK: Supple. No thyromegaly. Trachea midline. No JVD. No adenopathy. PULMONARY: Good air entry bilaterally.  Coarse, otherwise no adventitious sounds. CARDIOVASCULAR: S1 and S2. Regular rate and rhythm.  ABDOMEN: Obese, otherwise benign. MUSCULOSKELETAL: AKA amputation, right, no clubbing, no edema. Osteoarthritis changes on the hands. NEUROLOGIC: Grossly nonfocal.  Does have memory l fair meant. SKIN: Intact,warm,dry. PSYCH: Jovial mood, normal behavior.       Assessment & Plan:     ICD-10-CM   1. Asthmatic bronchitis with acute exacerbation, unspecified asthma severity, unspecified whether persistent  J45.901    Continue Trelegy 100, 1 inhalation daily Continue as needed albuterol    2. Bronchiectasis without complication (HCC)  J47.9    No evidence of decompensation No difficulty managing secretions     Overall the patient appears to be doing well.  He is to continue Trelegy as prescribed.  Will see him in follow-up in 4 months time he is to contact us prior to that time should any new difficulties arise.  Gailen Shelter, MD Advanced Bronchoscopy PCCM [facility]    *This note was dictated using voice recognition software/Dragon.  Despite best efforts to proofread, errors can occur which can change the meaning. Any transcriptional errors that result from this process are unintentional and may not be fully corrected at the time of dictation.

## 2022-02-17 ENCOUNTER — Other Ambulatory Visit: Payer: Self-pay

## 2022-02-17 ENCOUNTER — Emergency Department: Payer: Medicare Other

## 2022-02-17 ENCOUNTER — Inpatient Hospital Stay
Admission: EM | Admit: 2022-02-17 | Discharge: 2022-02-19 | DRG: 190 | Disposition: A | Discharge status: Skilled Nursing Facility | Payer: Medicare Other | Source: Skilled Nursing Facility | Attending: Internal Medicine | Admitting: Internal Medicine

## 2022-02-17 ENCOUNTER — Encounter: Payer: Self-pay | Admitting: Internal Medicine

## 2022-02-17 DIAGNOSIS — Z8616 Personal history of COVID-19: Secondary | ICD-10-CM

## 2022-02-17 DIAGNOSIS — Z882 Allergy status to sulfonamides status: Secondary | ICD-10-CM

## 2022-02-17 DIAGNOSIS — I2510 Atherosclerotic heart disease of native coronary artery without angina pectoris: Secondary | ICD-10-CM | POA: Diagnosis present

## 2022-02-17 DIAGNOSIS — Z7902 Long term (current) use of antithrombotics/antiplatelets: Secondary | ICD-10-CM

## 2022-02-17 DIAGNOSIS — Z7982 Long term (current) use of aspirin: Secondary | ICD-10-CM

## 2022-02-17 DIAGNOSIS — Z9841 Cataract extraction status, right eye: Secondary | ICD-10-CM

## 2022-02-17 DIAGNOSIS — Z87891 Personal history of nicotine dependence: Secondary | ICD-10-CM

## 2022-02-17 DIAGNOSIS — K5909 Other constipation: Secondary | ICD-10-CM | POA: Diagnosis present

## 2022-02-17 DIAGNOSIS — E860 Dehydration: Secondary | ICD-10-CM | POA: Diagnosis present

## 2022-02-17 DIAGNOSIS — Z880 Allergy status to penicillin: Secondary | ICD-10-CM

## 2022-02-17 DIAGNOSIS — Z89611 Acquired absence of right leg above knee: Secondary | ICD-10-CM

## 2022-02-17 DIAGNOSIS — Z961 Presence of intraocular lens: Secondary | ICD-10-CM | POA: Diagnosis present

## 2022-02-17 DIAGNOSIS — Q059 Spina bifida, unspecified: Secondary | ICD-10-CM

## 2022-02-17 DIAGNOSIS — E785 Hyperlipidemia, unspecified: Secondary | ICD-10-CM | POA: Diagnosis present

## 2022-02-17 DIAGNOSIS — J449 Chronic obstructive pulmonary disease, unspecified: Secondary | ICD-10-CM | POA: Diagnosis present

## 2022-02-17 DIAGNOSIS — Z951 Presence of aortocoronary bypass graft: Secondary | ICD-10-CM

## 2022-02-17 DIAGNOSIS — E1122 Type 2 diabetes mellitus with diabetic chronic kidney disease: Secondary | ICD-10-CM | POA: Diagnosis present

## 2022-02-17 DIAGNOSIS — Z9842 Cataract extraction status, left eye: Secondary | ICD-10-CM

## 2022-02-17 DIAGNOSIS — N183 Chronic kidney disease, stage 3 unspecified: Secondary | ICD-10-CM | POA: Diagnosis present

## 2022-02-17 DIAGNOSIS — J101 Influenza due to other identified influenza virus with other respiratory manifestations: Secondary | ICD-10-CM | POA: Diagnosis present

## 2022-02-17 DIAGNOSIS — I10 Essential (primary) hypertension: Secondary | ICD-10-CM | POA: Diagnosis present

## 2022-02-17 DIAGNOSIS — F4310 Post-traumatic stress disorder, unspecified: Secondary | ICD-10-CM | POA: Diagnosis present

## 2022-02-17 DIAGNOSIS — K5904 Chronic idiopathic constipation: Secondary | ICD-10-CM | POA: Diagnosis present

## 2022-02-17 DIAGNOSIS — G4700 Insomnia, unspecified: Secondary | ICD-10-CM | POA: Diagnosis present

## 2022-02-17 DIAGNOSIS — J441 Chronic obstructive pulmonary disease with (acute) exacerbation: Secondary | ICD-10-CM | POA: Diagnosis not present

## 2022-02-17 DIAGNOSIS — E1169 Type 2 diabetes mellitus with other specified complication: Secondary | ICD-10-CM | POA: Diagnosis present

## 2022-02-17 DIAGNOSIS — Z79899 Other long term (current) drug therapy: Secondary | ICD-10-CM

## 2022-02-17 DIAGNOSIS — F32A Depression, unspecified: Secondary | ICD-10-CM | POA: Diagnosis present

## 2022-02-17 DIAGNOSIS — Z86718 Personal history of other venous thrombosis and embolism: Secondary | ICD-10-CM

## 2022-02-17 DIAGNOSIS — I252 Old myocardial infarction: Secondary | ICD-10-CM

## 2022-02-17 DIAGNOSIS — N1832 Chronic kidney disease, stage 3b: Secondary | ICD-10-CM | POA: Diagnosis present

## 2022-02-17 DIAGNOSIS — K219 Gastro-esophageal reflux disease without esophagitis: Secondary | ICD-10-CM | POA: Diagnosis present

## 2022-02-17 DIAGNOSIS — E1151 Type 2 diabetes mellitus with diabetic peripheral angiopathy without gangrene: Secondary | ICD-10-CM | POA: Diagnosis present

## 2022-02-17 DIAGNOSIS — J9601 Acute respiratory failure with hypoxia: Secondary | ICD-10-CM | POA: Diagnosis present

## 2022-02-17 DIAGNOSIS — Z888 Allergy status to other drugs, medicaments and biological substances status: Secondary | ICD-10-CM

## 2022-02-17 DIAGNOSIS — Z993 Dependence on wheelchair: Secondary | ICD-10-CM

## 2022-02-17 DIAGNOSIS — N179 Acute kidney failure, unspecified: Secondary | ICD-10-CM | POA: Diagnosis present

## 2022-02-17 DIAGNOSIS — Z1152 Encounter for screening for COVID-19: Secondary | ICD-10-CM

## 2022-02-17 DIAGNOSIS — Z66 Do not resuscitate: Secondary | ICD-10-CM | POA: Diagnosis present

## 2022-02-17 DIAGNOSIS — I129 Hypertensive chronic kidney disease with stage 1 through stage 4 chronic kidney disease, or unspecified chronic kidney disease: Secondary | ICD-10-CM | POA: Diagnosis present

## 2022-02-17 DIAGNOSIS — Z8249 Family history of ischemic heart disease and other diseases of the circulatory system: Secondary | ICD-10-CM

## 2022-02-17 DIAGNOSIS — F419 Anxiety disorder, unspecified: Secondary | ICD-10-CM | POA: Diagnosis present

## 2022-02-17 LAB — CBC WITH DIFFERENTIAL/PLATELET
Abs Immature Granulocytes: 0.05 10*3/uL (ref 0.00–0.07)
Basophils Absolute: 0 10*3/uL (ref 0.0–0.1)
Basophils Relative: 0 %
Eosinophils Absolute: 0 10*3/uL (ref 0.0–0.5)
Eosinophils Relative: 0 %
HCT: 33.3 % — ABNORMAL LOW (ref 39.0–52.0)
Hemoglobin: 10.3 g/dL — ABNORMAL LOW (ref 13.0–17.0)
Immature Granulocytes: 1 %
Lymphocytes Relative: 14 %
Lymphs Abs: 1.4 10*3/uL (ref 0.7–4.0)
MCH: 29.4 pg (ref 26.0–34.0)
MCHC: 30.9 g/dL (ref 30.0–36.0)
MCV: 95.1 fL (ref 80.0–100.0)
Monocytes Absolute: 0.6 10*3/uL (ref 0.1–1.0)
Monocytes Relative: 7 %
Neutro Abs: 7.8 10*3/uL — ABNORMAL HIGH (ref 1.7–7.7)
Neutrophils Relative %: 78 %
Platelets: 194 10*3/uL (ref 150–400)
RBC: 3.5 MIL/uL — ABNORMAL LOW (ref 4.22–5.81)
RDW: 14.8 % (ref 11.5–15.5)
WBC: 9.9 10*3/uL (ref 4.0–10.5)
nRBC: 0 % (ref 0.0–0.2)

## 2022-02-17 LAB — COMPREHENSIVE METABOLIC PANEL
ALT: 9 U/L (ref 0–44)
AST: 15 U/L (ref 15–41)
Albumin: 2.9 g/dL — ABNORMAL LOW (ref 3.5–5.0)
Alkaline Phosphatase: 66 U/L (ref 38–126)
Anion gap: 6 (ref 5–15)
BUN: 34 mg/dL — ABNORMAL HIGH (ref 8–23)
CO2: 23 mmol/L (ref 22–32)
Calcium: 8 mg/dL — ABNORMAL LOW (ref 8.9–10.3)
Chloride: 110 mmol/L (ref 98–111)
Creatinine, Ser: 2.15 mg/dL — ABNORMAL HIGH (ref 0.61–1.24)
GFR, Estimated: 29 mL/min — ABNORMAL LOW (ref >60)
Glucose, Bld: 252 mg/dL — ABNORMAL HIGH (ref 70–99)
Potassium: 4.3 mmol/L (ref 3.5–5.1)
Sodium: 139 mmol/L (ref 135–145)
Total Bilirubin: 0.5 mg/dL (ref 0.3–1.2)
Total Protein: 6.1 g/dL — ABNORMAL LOW (ref 6.5–8.1)

## 2022-02-17 LAB — RESP PANEL BY RT-PCR (RSV, FLU A&B, COVID)  RVPGX2
Influenza A by PCR: POSITIVE — AB
Influenza B by PCR: NEGATIVE
Resp Syncytial Virus by PCR: NEGATIVE
SARS Coronavirus 2 by RT PCR: NEGATIVE

## 2022-02-17 LAB — BRAIN NATRIURETIC PEPTIDE: B Natriuretic Peptide: 81 pg/mL (ref 0.0–100.0)

## 2022-02-17 LAB — TROPONIN I (HIGH SENSITIVITY): Troponin I (High Sensitivity): 8 ng/L (ref <18)

## 2022-02-17 LAB — PROCALCITONIN: Procalcitonin: <0.1 ng/mL

## 2022-02-17 LAB — PHOSPHORUS: Phosphorus: 3.2 mg/dL (ref 2.5–4.6)

## 2022-02-17 LAB — MAGNESIUM: Magnesium: 2.2 mg/dL (ref 1.7–2.4)

## 2022-02-17 MED ORDER — ONDANSETRON HCL 4 MG PO TABS: 4.0000 mg | ORAL_TABLET | Freq: Four times a day (QID) | ORAL | Status: DC | PRN

## 2022-02-17 MED ORDER — IPRATROPIUM-ALBUTEROL 0.5-2.5 (3) MG/3ML IN SOLN
3.0000 mL | Freq: Once | RESPIRATORY_TRACT | Status: AC
  Administered 2022-02-17: 3 mL via RESPIRATORY_TRACT
  Filled 2022-02-17: qty 3

## 2022-02-17 MED ORDER — HEPARIN SODIUM (PORCINE) 5000 UNIT/ML IJ SOLN
5000.0000 [IU] | Freq: Three times a day (TID) | INTRAMUSCULAR | Status: DC
  Administered 2022-02-17 – 2022-02-19 (×4): 5000 [IU] via SUBCUTANEOUS
  Filled 2022-02-17 (×4): qty 1

## 2022-02-17 MED ORDER — IPRATROPIUM-ALBUTEROL 0.5-2.5 (3) MG/3ML IN SOLN
3.0000 mL | Freq: Three times a day (TID) | RESPIRATORY_TRACT | Status: AC
  Administered 2022-02-17 – 2022-02-18 (×4): 3 mL via RESPIRATORY_TRACT
  Filled 2022-02-17 (×4): qty 3

## 2022-02-17 MED ORDER — SENNOSIDES-DOCUSATE SODIUM 8.6-50 MG PO TABS: 1.0000 | ORAL_TABLET | Freq: Every evening | ORAL | Status: DC | PRN

## 2022-02-17 MED ORDER — ONDANSETRON HCL 4 MG/2ML IJ SOLN: 4.0000 mg | Freq: Four times a day (QID) | INTRAMUSCULAR | Status: DC | PRN

## 2022-02-17 MED ORDER — ACETAMINOPHEN 325 MG PO TABS: 650.0000 mg | ORAL_TABLET | Freq: Four times a day (QID) | ORAL | Status: DC | PRN

## 2022-02-17 MED ORDER — ACETAMINOPHEN 650 MG RE SUPP: 650.0000 mg | Freq: Four times a day (QID) | RECTAL | Status: DC | PRN

## 2022-02-17 MED ORDER — OSELTAMIVIR PHOSPHATE 30 MG PO CAPS
30.0000 mg | ORAL_CAPSULE | Freq: Once | ORAL | Status: AC
  Administered 2022-02-17: 30 mg via ORAL
  Filled 2022-02-17: qty 1

## 2022-02-17 NOTE — ED Triage Notes (Signed)
Pt stated that he began having SOB starting Friday. Today staff found with increased WOB and low O2 Sats. EMS gave Albuterol, Duoned, 125 Solumedrol and 500 cc Nacl, with improvement.

## 2022-02-17 NOTE — H&P (Signed)
History and Physical   [name]. OHY:[id number] DOB: 1930-08-22 DOA: 02/17/2022  PCP: [facility] [place] (Confirm with patient/family/NH records and if not entered, this has to be entered at [facility] point of entry) Outpatient Specialists: *** Actor and name if known) Patient coming from: ***  I have personally briefly reviewed patient's old medical records in [place].  Chief Concern: ***  HPI: [name] is a 88 year old male with history of former tobacco use, hypertension, hyperlipidemia, CAD status post four-vessel CABG in 1989, PAD status post right femoral stent and ballooning bilateral lower extremity at [facility] health on 03/22/2019, CKD stage III, spina bifida, with wheel chair bound state, prior COVID 19 infection, GERD, history of DVT, PTSD, insomnia, depression, history of right foot osteomyelitis status post right AKA in 04/19/2019, the emergency department for chief concerns of shortness of breath.  Initial vitals in the emergency department showed temperature 98.7, respiration rate of 19, heart rate of 97, blood pressure 155/58, SpO2 98% on room air.  Serum sodium is 139, potassium 4.3, chloride of 110, bicarb of 23, BUN of 34, serum creatinine of 2.15, EGFR 29, nonfasting blood glucose 252, WBC 9.9, hemoglobin 10.3, platelets of 194.  BNP is 81.  High sensitive troponin was 8.  Patient tested positive for influenza A.  ED treatment: DuoNebs x 2, Tamiflu was ordered. ----------  [name] to self, age, current lcoation, year  Sob + couhg, productive of whtie sputum. Since Friday.     Social history: He lives in a nursing home. He is a former tobacco user, quitting 40 years ago. He denies etoh and recreational drug use. 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  Vaccination history: ***  (The initial 2-3 lines should be focused and good to copy and paste in the HPI section of the daily progress note).   (For level 3, the  HPI must include 4+ descriptors: Location, Quality, Severity, Duration, Timing, Context, modifying factors, associated signs/symptoms and/or status of 3+ chronic problems.)   ROS:*** Constitutional: no weight change, no fever ENT/Mouth: no sore throat, no rhinorrhea Eyes: no eye pain, no vision changes Cardiovascular: no chest pain, no dyspnea,  no edema, no palpitations Respiratory: no cough, no sputum, no wheezing Gastrointestinal: no nausea, no vomiting, no diarrhea, no constipation Genitourinary: no urinary incontinence, no dysuria, no hematuria Musculoskeletal: no arthralgias, no myalgias Skin: no skin lesions, no pruritus, Neuro: + weakness, no loss of consciousness, no syncope Psych: no anxiety, no depression, + decrease appetite Heme/Lymph: no bruising, no bleeding  ED Course: Discussed with EDP, patient requiring hospitalization for copd.   Assessment/Plan  Principal Problem:   COPD exacerbation (HCC) Active Problems:   Type 2 diabetes mellitus with stage 3 chronic kidney disease, without long-term current use of insulin (HCC)   PTSD   Essential hypertension   Spina bifida ([place])   S/P AKA (above knee amputation), right (HCC)   Influenza A    Assessment and Plan: * COPD exacerbation ([place]) - Presumed secondary to influenza A - DuoNebs 3 times daily, 4 doses ordered - Admit to telemetry medical, observation      *** Chart reviewed.   DVT prophylaxis: ***  Code Status: ***  Diet: *** Family Communication: Phone call offered patient decline, stating that Legrand Como knows pt is in hospital. Disposition Plan: ***  Consults called: ***  Admission status: ***   Past Medical History:  Diagnosis Date   Agent orange exposure 1966 or 1971  Anemia    Anxiety    Arthritis    "hands and back" (01/20/2013)   CAD (coronary artery disease)    native vessel   Carotid artery disease (HCC)    nonobstructive   Cataract    Cecal diverticulitis 2008   drained    Cellulitis, gluteal    bilateral for the past 6 months/notes 01/20/2013   Chronic lower back pain    Chronic renal insufficiency    Depression    Diabetes mellitus type II    Exertional shortness of breath    HTN (hypertension)    Hyperlipidemia    Hypokalemia    Malnutrition (HCC)    protein-calorie   Myocardial infarction ([place])    "silent; before OHS" (01/20/2013)   PTSD (post-traumatic stress disorder)    Spina bifida ([place])    Urinary incontinence     Past Surgical History:  Procedure Laterality Date   APPLICATION OF WOUND VAC Right 04/08/2019   Procedure: APPLICATION OF WOUND VAC;  Surgeon: Edrick Kins, DPM;  Location: [facility];  Service: Podiatry;  Laterality: Right;   BONE BIOPSY Right 04/08/2019   Procedure: BONE BIOPSY;  Surgeon: Edrick Kins, DPM;  Location: [facility];  Service: Podiatry;  Laterality: Right;   CARDIAC CATHETERIZATION  1998   "couple before my OHS" (01/20/2013)   CARPAL TUNNEL RELEASE Right 1980's   CATARACT EXTRACTION W/ INTRAOCULAR LENS  IMPLANT, BILATERAL Bilateral ~ 2010   [place]   "CABG X4" (01/20/2013)   CYSTECTOMY  2000's   "cytal cyst on my intestines; probed then drained it; hospitalized for 13 days; NPO" (01/20/2013)   EYE SURGERY     HEMORRHOID BANDING  ~ 10/2012   IR FLUORO GUIDE CV LINE RIGHT  04/01/2019   IR US GUIDE VASC ACCESS RIGHT  04/01/2019   KNEE SURGERY Left 1964   "exploratory; sewed it up w/wire" (01/20/2013)   LOWER EXTREMITY ANGIOGRAPHY Right 03/22/2019   Procedure: LOWER EXTREMITY ANGIOGRAPHY;  Surgeon: Katha Cabal, MD;  Location: [facility];  Service: Cardiovascular;  Laterality: Right;   LOWER EXTREMITY ANGIOGRAPHY Left 03/29/2019   Procedure: LOWER EXTREMITY ANGIOGRAPHY;  Surgeon: Katha Cabal, MD;  Location: [place] [facility];  Service: Cardiovascular;  Laterality: Left;   PROSTATE SURGERY     SPINE SURGERY  11/24/1932   "Spina bifida  surgery"   STRABISMUS SURGERY Bilateral 03/29/2015   Procedure: REPAIR STRABISMUS BILATERAL;  Surgeon: Lamonte Sakai, MD;  Location: [facility];  Service: Ophthalmology;  Laterality: Bilateral;   WOUND DEBRIDEMENT Right 04/08/2019   Procedure: DEBRIDEMENT WOUND;  Surgeon: Edrick Kins, DPM;  Location: [facility];  Service: Podiatry;  Laterality: Right;    Social History:  reports that he quit smoking about 41 years ago. His smoking use included cigarettes. He started smoking about 73 years ago. He has a 40.00 pack-year smoking history. He has quit using smokeless tobacco.  His smokeless tobacco use included chew. He reports that he does not drink alcohol and does not use drugs.  Allergies  Allergen Reactions   Sulfonamide Derivatives     REACTION: pruitis patient cant remember its been so long     Aspirin Rash    In high doses   Nitrofurantoin Rash   Penicillins Rash    Did it involve swelling of the face/tongue/throat, SOB, or low BP? No Did it involve sudden or severe rash/hives, skin peeling, or any reaction  on the inside of your mouth or nose? No Did you need to seek medical attention at a hospital or doctor's office? No When did it last happen?      Teenager If all above answers are "NO", may proceed with cephalosporin use.   Sulfa Antibiotics Rash   Family History  Problem Relation Age of Onset   Heart disease Mother    Heart attack Mother    Heart disease Father    Heart attack Father    Cancer Sister        breast   Cancer Sister        lung and ovarian   Family history: Family history reviewed and not pertinent***  Prior to Admission medications   Medication Sig Start Date End Date Taking? Authorizing Provider  acetaminophen (TYLENOL) 325 MG tablet Take 650 mg by mouth every 6 (six) hours as needed. 01/21/22  Yes [provider]  ARTIFICIAL TEARS 0.2-0.2-1 % SOLN Apply to eye 3 (three) times daily. Instill 2 drops into each eye 3  times daily. 02/20/20  Yes [provider]  aspirin 81 MG tablet Take 81 mg by mouth daily.   Yes [provider]  buPROPion (WELLBUTRIN XL) 150 MG 24 hr tablet Take 150 mg by mouth daily.   Yes [provider]  clopidogrel (PLAVIX) 75 MG tablet Take 1 tablet (75 mg total) by mouth daily. 03/23/19  Yes Schnier, Dolores Lory, MD  Coenzyme Q10 (CO Q-10) 100 MG CAPS Take 1 capsule by mouth daily. 01/29/22  Yes [provider]  docusate sodium (COLACE) 100 MG capsule Take 100 mg by mouth at bedtime.   Yes [provider]  ezetimibe (ZETIA) 10 MG tablet Take 10 mg by mouth at bedtime.   Yes [provider]  Fluticasone-Umeclidin-Vilant (TRELEGY ELLIPTA) 100-62.5-25 MCG/ACT AEPB Inhale 1 puff into the lungs daily. 10/01/21  Yes Tyler Pita, MD  ipratropium-albuterol (DUONEB) 0.5-2.5 (3) MG/3ML SOLN 1 NEBULIZER SOLUTION (3ML) BY ORAL INHALATION EVERY 4 TO 6 HOURS AS NEEDED 12/13/21  Yes [provider]  melatonin 5 MG TABS Take 5 mg by mouth at bedtime.   Yes [provider]  metoprolol tartrate (LOPRESSOR) 25 MG tablet Take 0.5 tablets (12.5 mg total) by mouth 2 (two) times daily. 12/15/18  Yes Dettinger, Fransisca Kaufmann, MD  Multiple Vitamins-Minerals (PRESERVISION AREDS 2 PO) Take 1 capsule by mouth 2 (two) times daily.   Yes [provider]  polyethylene glycol powder (GLYCOLAX/MIRALAX) 17 GM/SCOOP powder Take 17 g by mouth daily. 07/26/21  Yes Harvest Dark, MD  pregabalin (LYRICA) 50 MG capsule Take 50 mg by mouth 2 (two) times daily.   Yes [provider]  senna (SENOKOT) 8.6 MG tablet Take 2 tablets by mouth 2 (two) times daily.   Yes [provider]  simvastatin (ZOCOR) 20 MG tablet Take 20 mg by mouth at bedtime.   Yes [provider]  sitaGLIPtin (JANUVIA) 50 MG tablet Take 50 mg by mouth daily.   Yes [provider]  12 HOUR NASAL DECONGESTANT 0.05 % nasal spray Place 2 sprays into  both nostrils 2 (two) times daily. For 3 days 02/29/20   [provider]  albuterol (VENTOLIN HFA) 108 (90 Base) MCG/ACT inhaler Inhale 2 puffs into the lungs every 6 (six) hours as needed for wheezing or shortness of breath. 10/01/21   Tyler Pita, MD  Cadexomer Iodine (IODOFLEX) 0.9 % PADS Apply 1 application topically See admin instructions. Twice a week  heel Patient not taking: Reported on 02/17/2022    [provider]  cefpodoxime (VANTIN) 200 MG tablet Take 1 tablet (200 mg total) by mouth 2 (two) times daily. Patient not taking: Reported on 01/02/2022 08/23/20   Ward, Delice Bison, DO  Dextromethorphan-guaiFENesin (MUCINEX DM) 30-600 MG TB12 Take 1 tablet by mouth 2 (two) times daily. For 5 days 02/29/20   [provider]  [facility] 10 GM/15ML SOLN Take 30 mLs by mouth every Monday, Wednesday, and Friday. 01/10/20   [provider]  nystatin (MYCOSTATIN/NYSTOP) powder Apply topically 2 (two) times daily. Apply to groin and perineum twice a day Patient not taking: Reported on 01/02/2022 03/06/20   Jennye Boroughs, MD  omeprazole (PRILOSEC) 20 MG capsule Take 20 mg by mouth daily. Patient not taking: Reported on 01/02/2022 12/22/19   [provider]  pregabalin (LYRICA) 25 MG capsule Take 25 mg by mouth as needed. Patient not taking: Reported on 02/17/2022    [provider]  simethicone (GAS-X) 80 MG chewable tablet Chew 1 tablet (80 mg total) by mouth 4 (four) times daily as needed for flatulence. 07/26/21 07/26/22  Harvest Dark, MD  sitaGLIPtin-metformin (JANUMET) 50-500 MG tablet Take 1 tablet by mouth daily. with food Patient not taking: Reported on 01/02/2022 10/20/18   Dettinger, Fransisca Kaufmann, MD    Physical Exam: Vitals:   02/17/22 1142 02/17/22 1143 02/17/22 1300 02/17/22 1700  BP: (!) 155/58  (!) 136/55 131/62  Pulse: 97  82 80  Resp: 19  (!) 24 (!) 22  Temp: 98.7 F (37.1 C)   98.6 F (37 C)  SpO2: 98%  99% 97%  Weight:  102 kg     Height:  _0  (1.727 m)     Constitutional: appears ***, NAD, calm, comfortable Eyes: PERRL, lids and conjunctivae normal ENMT: Mucous membranes are moist. Posterior pharynx clear of any exudate or lesions. Age-appropriate dentition. Hearing appropriate/loss*** Neck: normal, supple, no masses, no thyromegaly Respiratory: clear to auscultation bilaterally, no wheezing, no crackles. Normal respiratory effort. No accessory muscle use.  Cardiovascular: Regular rate and rhythm, no murmurs / rubs / gallops. No extremity edema. 2+ pedal pulses. No carotid bruits.  Abdomen: no tenderness, no masses palpated, no hepatosplenomegaly. Bowel sounds positive.  Musculoskeletal: no clubbing / cyanosis. No joint deformity upper and lower extremities. Good ROM, no contractures, no atrophy. Normal muscle tone.  Skin: no rashes, lesions, ulcers. No induration Neurologic: Sensation intact. Strength 5/5 in all 4.  Psychiatric: Normal judgment and insight. Alert and oriented x 3. Normal mood.   EKG: independently reviewed, showing ***  Chest x-ray on Admission: I personally reviewed and I agree*** with radiologist reading as below.  DG Chest Portable 1 View  Result Date: 02/17/2022 CLINICAL DATA:  SOB EXAM: PORTABLE CHEST 1 VIEW COMPARISON:  September 28, 2021 FINDINGS: The cardiomediastinal silhouette is unchanged in contour.Status post median sternotomy. No pleural effusion. No pneumothorax. Low lung volume radiograph. Similar coarse reticular opacities of the bases likely reflecting underlying pulmonary fibrosis. No acute pleuroparenchymal abnormality. IMPRESSION: No acute cardiopulmonary abnormality. Electronically Signed   By: Valentino Saxon M.D.   On: 02/17/2022 12:08    Labs on Admission: I have personally reviewed following labs CBC: Recent Labs  Lab 02/17/22 1224  WBC 9.9  NEUTROABS 7.8*  HGB 10.3*  HCT 33.3*  MCV 95.1  PLT 235   Basic Metabolic Panel: Recent Labs  Lab 02/17/22 1224  NA  139  K 4.3  CL 110  CO2 23  GLUCOSE 252*  BUN 34*  CREATININE 2.15*  CALCIUM 8.0*  MG 2.2  PHOS 3.2   GFR: Estimated Creatinine Clearance: 26.9 mL/min (A) (by C-G formula based on SCr of 2.15 mg/dL (H)). Liver Function Tests: Recent Labs  Lab 02/17/22 1224  AST 15  ALT 9  ALKPHOS 66  BILITOT 0.5  PROT 6.1*  ALBUMIN 2.9*   No results for input(s): "LIPASE", "AMYLASE" in the last 168 hours. No results for input(s): "AMMONIA" in the last 168 hours. Coagulation Profile: No results for input(s): "INR", "PROTIME" in the last 168 hours. Cardiac Enzymes: No results for input(s): "CKTOTAL", "CKMB", "CKMBINDEX", "TROPONINI" in the last 168 hours. BNP (last 3 results) No results for input(s): "PROBNP" in the last 8760 hours. HbA1C: No results for input(s): "HGBA1C" in the last 72 hours. CBG: No results for input(s): "GLUCAP" in the last 168 hours. Lipid Profile: No results for input(s): "CHOL", "HDL", "LDLCALC", "TRIG", "CHOLHDL", "LDLDIRECT" in the last 72 hours. Thyroid Function Tests: No results for input(s): "TSH", "T4TOTAL", "FREET4", "T3FREE", "THYROIDAB" in the last 72 hours. Anemia Panel: No results for input(s): "VITAMINB12", "FOLATE", "FERRITIN", "TIBC", "IRON", "RETICCTPCT" in the last 72 hours. Urine analysis:    Component Value Date/Time   COLORURINE YELLOW (A) 09/28/2021 0429   APPEARANCEUR CLOUDY (A) 09/28/2021 0429   APPEARANCEUR Clear 03/31/2018 1134   LABSPEC 1.025 09/28/2021 0429   PHURINE 5.0 09/28/2021 0429   GLUCOSEU NEGATIVE 09/28/2021 0429   HGBUR LARGE (A) 09/28/2021 0429   BILIRUBINUR NEGATIVE 09/28/2021 0429   BILIRUBINUR Negative 03/31/2018 [place] 09/28/2021 0429   PROTEINUR 30 (A) 09/28/2021 0429   UROBILINOGEN negative 11/16/2014 1132   UROBILINOGEN 0.2 01/19/2013 1547   NITRITE NEGATIVE 09/28/2021 0429   LEUKOCYTESUR LARGE (A) 09/28/2021 0429    This document was prepared using Dragon Voice Recognition software and  may include unintentional dictation errors.  Dr. Tobie Poet [facility]  If 7PM-7AM, please contact overnight-coverage provider If 7AM-7PM, please contact day coverage provider [email or web address]  02/17/2022, 6:24 PM

## 2022-02-17 NOTE — ED Provider Notes (Signed)
[facility] Provider Note    Event Date/Time   First MD Initiated Contact with Patient 02/17/22 1139     (approximate)   History   Shortness of Breath   HPI  Bobby Gum. is a 88 y.o. male   with past medical history of diabetes, COPD, coronary disease, CKD, here with shortness of breath.  The patient states that for the last 3 days, he separatively worsening shortness of breath with wheezing.  It began fairly acutely and has persisted since onset.  Has had some general fatigue, cough, and wheezing.  He has had some mild sputum production but not persistent.  No hemoptysis.  Denies any chest pain.  He has had decreased energy and has had significant dyspnea with any amount of even moving himself up in bed.  Denies any leg swelling.  Denies any known sick contacts.  No other complaints.      Physical Exam   Triage Vital Signs: ED Triage Vitals  Enc Vitals Group     BP      Pulse      Resp      Temp      Temp src      SpO2      Weight      Height      Head Circumference      Peak Flow      Pain Score      Pain Loc      Pain Edu?      Excl. in [place]?     Most recent vital signs: Vitals:   02/17/22 1142 02/17/22 1300  BP: (!) 155/58 (!) 136/55  Pulse: 97 82  Resp: 19 (!) 24  Temp: 98.7 F (37.1 C)   SpO2: 98% 99%     General: Awake, no distress.  CV:  Good peripheral perfusion.  Intermittent tachycardia, regular rhythm. Resp:  Increased work of breathing with bilateral wheezing and diminished aeration. Abd:  No distention.  Other:  No lower extremity edema in the left.  Status post right AKA.   ED Results / Procedures / Treatments   Labs (all labs ordered are listed, but only abnormal results are displayed) Labs Reviewed  RESP PANEL BY RT-PCR (RSV, FLU A&B, COVID)  [id number] - Abnormal; Notable for the following components:      Result Value   Influenza A by PCR POSITIVE (*)    All other components within normal limits  CBC  WITH DIFFERENTIAL/PLATELET - Abnormal; Notable for the following components:   RBC 3.50 (*)    Hemoglobin 10.3 (*)    HCT 33.3 (*)    Neutro Abs 7.8 (*)    All other components within normal limits  COMPREHENSIVE METABOLIC PANEL - Abnormal; Notable for the following components:   Glucose, Bld 252 (*)    BUN 34 (*)    Creatinine, Ser 2.15 (*)    Calcium 8.0 (*)    Total Protein 6.1 (*)    Albumin 2.9 (*)    GFR, Estimated 29 (*)    All other components within normal limits  CULTURE, BLOOD (SINGLE)  BRAIN NATRIURETIC PEPTIDE  PROCALCITONIN  TROPONIN I (HIGH SENSITIVITY)     EKG Sinus tachycardia, ventricular 102.  PR 186, QRS 96, QTc 455.  No acute ST elevations or depressions.  No acute evidence of acute ischemia or infarct.   RADIOLOGY CXR: [place]   I also independently reviewed and agree with radiologist interpretations.   PROCEDURES:  Critical Care performed: No  .1-3 Lead EKG Interpretation  Performed by: Duffy Bruce, MD Authorized by: Duffy Bruce, MD     Interpretation: non-specific     ECG rate:  90-110   ECG rate assessment: tachycardic     Rhythm: sinus tachycardia     Ectopy: none     Conduction: normal   Comments:     Indication: SOB     MEDICATIONS ORDERED IN ED: Medications  oseltamivir (TAMIFLU) capsule 30 mg (has no administration in time range)  ipratropium-albuterol (DUONEB) 0.5-2.5 (3) MG/3ML nebulizer solution 3 mL (3 mLs Nebulization Given 02/17/22 1245)  ipratropium-albuterol (DUONEB) 0.5-2.5 (3) MG/3ML nebulizer solution 3 mL (3 mLs Nebulization Given 02/17/22 1245)     IMPRESSION / MDM / ASSESSMENT AND PLAN / ED COURSE  I reviewed the triage vital signs and the nursing notes.                              Differential diagnosis includes, but is not limited to, COPD exacerbation, influenza, COVID-19, other URI, CAP, CHF, ACS, unlikely PE  Patient's presentation is most consistent with acute presentation with potential threat  to life or bodily function.  The patient is on the cardiac monitor to evaluate for evidence of arrhythmia and/or significant heart rate changes.  88 year old male with history of COPD, here with shortness of breath and new onset hypoxia.  Patient has diffuse wheezing on exam with increased work of breathing.  He seems to be mentating normally without evidence of hypercapnia clinically.  CBC shows no leukocytosis.  Hemoglobin is at baseline.  Lab work shows possible mild acute on chronic kidney injury, query possible dehydration.  BNP and troponin normal.  Influenza A is positive.  Chest x-ray is clear.  Suspect acute hypoxic respiratory failure secondary to influenza.  Patient given Tamiflu, DuoNebs, steroids, and will admit to medicine.  Reviewed prior PCP notes, patient does not appear to be on oxygen at baseline.     FINAL CLINICAL IMPRESSION(S) / ED DIAGNOSES   Final diagnoses:  Acute respiratory failure with hypoxemia ([place])  Influenza A     Rx / DC Orders   ED Discharge Orders     None        Note:  This document was prepared using Dragon voice recognition software and may include unintentional dictation errors.   Duffy Bruce, MD 02/17/22 1331

## 2022-02-17 NOTE — ED Notes (Signed)
Pt provided with pericare, dry brief, and dressing change to sacral region.  Extensive moisture damage and stage 2 sacral pressure ulcer noted. Pt warm and dry at this time.  Male purewick placed.

## 2022-02-17 NOTE — Hospital Course (Addendum)
Mr. Edgerrin Correia is a 88 year old male with history of former tobacco use, hypertension, hyperlipidemia, CAD status post four-vessel CABG in 1989, PAD status post right femoral stent and ballooning bilateral lower extremity at [facility] health on 03/22/2019, CKD stage III, spina bifida, with wheel chair bound state, prior COVID 19 infection, GERD, history of DVT, PTSD, insomnia, depression, history of right foot osteomyelitis status post right AKA in 04/19/2019, the emergency department for chief concerns of shortness of breath.  Initial vitals in the emergency department showed temperature 98.7, respiration rate of 19, heart rate of 97, blood pressure 155/58, SpO2 98% on room air.  Serum sodium is 139, potassium 4.3, chloride of 110, bicarb of 23, BUN of 34, serum creatinine of 2.15, EGFR 29, nonfasting blood glucose 252, WBC 9.9, hemoglobin 10.3, platelets of 194.  BNP is 81.  High sensitive troponin was 8.  Patient tested positive for influenza A.  ED treatment: DuoNebs x 2, Tamiflu was ordered.

## 2022-02-17 NOTE — Assessment & Plan Note (Signed)
-   Presumed secondary to influenza A - DuoNebs 3 times daily, 4 doses ordered - Admit to telemetry medical, observation

## 2022-02-18 DIAGNOSIS — Z89611 Acquired absence of right leg above knee: Secondary | ICD-10-CM | POA: Diagnosis not present

## 2022-02-18 DIAGNOSIS — Q059 Spina bifida, unspecified: Secondary | ICD-10-CM | POA: Diagnosis not present

## 2022-02-18 DIAGNOSIS — J441 Chronic obstructive pulmonary disease with (acute) exacerbation: Secondary | ICD-10-CM | POA: Diagnosis present

## 2022-02-18 DIAGNOSIS — E1169 Type 2 diabetes mellitus with other specified complication: Secondary | ICD-10-CM | POA: Diagnosis present

## 2022-02-18 DIAGNOSIS — E1151 Type 2 diabetes mellitus with diabetic peripheral angiopathy without gangrene: Secondary | ICD-10-CM | POA: Diagnosis present

## 2022-02-18 DIAGNOSIS — I2510 Atherosclerotic heart disease of native coronary artery without angina pectoris: Secondary | ICD-10-CM | POA: Diagnosis present

## 2022-02-18 DIAGNOSIS — Z8249 Family history of ischemic heart disease and other diseases of the circulatory system: Secondary | ICD-10-CM | POA: Diagnosis not present

## 2022-02-18 DIAGNOSIS — N1832 Chronic kidney disease, stage 3b: Secondary | ICD-10-CM | POA: Diagnosis present

## 2022-02-18 DIAGNOSIS — F32A Depression, unspecified: Secondary | ICD-10-CM | POA: Diagnosis present

## 2022-02-18 DIAGNOSIS — E860 Dehydration: Secondary | ICD-10-CM | POA: Diagnosis present

## 2022-02-18 DIAGNOSIS — Z86718 Personal history of other venous thrombosis and embolism: Secondary | ICD-10-CM | POA: Diagnosis not present

## 2022-02-18 DIAGNOSIS — Z79899 Other long term (current) drug therapy: Secondary | ICD-10-CM | POA: Diagnosis not present

## 2022-02-18 DIAGNOSIS — E1122 Type 2 diabetes mellitus with diabetic chronic kidney disease: Secondary | ICD-10-CM | POA: Diagnosis present

## 2022-02-18 DIAGNOSIS — J449 Chronic obstructive pulmonary disease, unspecified: Secondary | ICD-10-CM | POA: Diagnosis present

## 2022-02-18 DIAGNOSIS — Z66 Do not resuscitate: Secondary | ICD-10-CM | POA: Diagnosis present

## 2022-02-18 DIAGNOSIS — Z8616 Personal history of COVID-19: Secondary | ICD-10-CM | POA: Diagnosis not present

## 2022-02-18 DIAGNOSIS — Z1152 Encounter for screening for COVID-19: Secondary | ICD-10-CM | POA: Diagnosis not present

## 2022-02-18 DIAGNOSIS — K5909 Other constipation: Secondary | ICD-10-CM | POA: Diagnosis present

## 2022-02-18 DIAGNOSIS — J101 Influenza due to other identified influenza virus with other respiratory manifestations: Secondary | ICD-10-CM | POA: Diagnosis present

## 2022-02-18 DIAGNOSIS — Z951 Presence of aortocoronary bypass graft: Secondary | ICD-10-CM | POA: Diagnosis not present

## 2022-02-18 DIAGNOSIS — N179 Acute kidney failure, unspecified: Secondary | ICD-10-CM | POA: Diagnosis present

## 2022-02-18 DIAGNOSIS — E785 Hyperlipidemia, unspecified: Secondary | ICD-10-CM | POA: Diagnosis present

## 2022-02-18 DIAGNOSIS — Z993 Dependence on wheelchair: Secondary | ICD-10-CM | POA: Diagnosis not present

## 2022-02-18 DIAGNOSIS — I129 Hypertensive chronic kidney disease with stage 1 through stage 4 chronic kidney disease, or unspecified chronic kidney disease: Secondary | ICD-10-CM | POA: Diagnosis present

## 2022-02-18 DIAGNOSIS — J9601 Acute respiratory failure with hypoxia: Secondary | ICD-10-CM | POA: Diagnosis present

## 2022-02-18 DIAGNOSIS — I252 Old myocardial infarction: Secondary | ICD-10-CM | POA: Diagnosis not present

## 2022-02-18 LAB — BASIC METABOLIC PANEL
Anion gap: 5 (ref 5–15)
BUN: 33 mg/dL — ABNORMAL HIGH (ref 8–23)
CO2: 22 mmol/L (ref 22–32)
Calcium: 7.8 mg/dL — ABNORMAL LOW (ref 8.9–10.3)
Chloride: 106 mmol/L (ref 98–111)
Creatinine, Ser: 1.8 mg/dL — ABNORMAL HIGH (ref 0.61–1.24)
GFR, Estimated: 36 mL/min — ABNORMAL LOW (ref >60)
Glucose, Bld: 282 mg/dL — ABNORMAL HIGH (ref 70–99)
Potassium: 4.5 mmol/L (ref 3.5–5.1)
Sodium: 133 mmol/L — ABNORMAL LOW (ref 135–145)

## 2022-02-18 LAB — CBC
HCT: 32.3 % — ABNORMAL LOW (ref 39.0–52.0)
Hemoglobin: 10.1 g/dL — ABNORMAL LOW (ref 13.0–17.0)
MCH: 29.3 pg (ref 26.0–34.0)
MCHC: 31.3 g/dL (ref 30.0–36.0)
MCV: 93.6 fL (ref 80.0–100.0)
Platelets: 173 10*3/uL (ref 150–400)
RBC: 3.45 MIL/uL — ABNORMAL LOW (ref 4.22–5.81)
RDW: 14.5 % (ref 11.5–15.5)
WBC: 9.3 10*3/uL (ref 4.0–10.5)
nRBC: 0 % (ref 0.0–0.2)

## 2022-02-18 LAB — CBG MONITORING, ED
Glucose-Capillary: 305 mg/dL — ABNORMAL HIGH (ref 70–99)
Glucose-Capillary: 166 mg/dL — ABNORMAL HIGH (ref 70–99)
Glucose-Capillary: 105 mg/dL — ABNORMAL HIGH (ref 70–99)

## 2022-02-18 LAB — GLUCOSE, CAPILLARY
Glucose-Capillary: 99 mg/dL (ref 70–99)
Glucose-Capillary: 139 mg/dL — ABNORMAL HIGH (ref 70–99)

## 2022-02-18 LAB — HEMOGLOBIN A1C
Hgb A1c MFr Bld: 7.5 % — ABNORMAL HIGH (ref 4.8–5.6)
Mean Plasma Glucose: 169 mg/dL

## 2022-02-18 MED ORDER — EZETIMIBE 10 MG PO TABS
10.0000 mg | ORAL_TABLET | Freq: Every day | ORAL | Status: DC
  Administered 2022-02-18: 10 mg via ORAL
  Filled 2022-02-18: qty 1

## 2022-02-18 MED ORDER — SIMVASTATIN 20 MG PO TABS
20.0000 mg | ORAL_TABLET | Freq: Every day | ORAL | Status: DC
  Administered 2022-02-18: 20 mg via ORAL
  Filled 2022-02-18: qty 1

## 2022-02-18 MED ORDER — INSULIN ASPART 100 UNIT/ML IJ SOLN
0.0000 [IU] | Freq: Three times a day (TID) | INTRAMUSCULAR | Status: DC
  Administered 2022-02-18: 2 [IU] via SUBCUTANEOUS
  Administered 2022-02-19: 1 [IU] via SUBCUTANEOUS
  Filled 2022-02-18 (×2): qty 1

## 2022-02-18 MED ORDER — SODIUM CHLORIDE 0.9 % IV SOLN: INTRAVENOUS | Status: AC

## 2022-02-18 MED ORDER — FLUTICASONE FUROATE-VILANTEROL 100-25 MCG/ACT IN AEPB: 1.0000 | INHALATION_SPRAY | Freq: Every day | RESPIRATORY_TRACT | Status: DC

## 2022-02-18 MED ORDER — CLOPIDOGREL BISULFATE 75 MG PO TABS
75.0000 mg | ORAL_TABLET | Freq: Every day | ORAL | Status: DC
  Administered 2022-02-18 – 2022-02-19 (×2): 75 mg via ORAL
  Filled 2022-02-18 (×2): qty 1

## 2022-02-18 MED ORDER — PREGABALIN 50 MG PO CAPS
50.0000 mg | ORAL_CAPSULE | Freq: Two times a day (BID) | ORAL | Status: DC
  Administered 2022-02-18 – 2022-02-19 (×3): 50 mg via ORAL
  Filled 2022-02-18 (×3): qty 1

## 2022-02-18 MED ORDER — METOPROLOL TARTRATE 25 MG PO TABS
12.5000 mg | ORAL_TABLET | Freq: Two times a day (BID) | ORAL | Status: DC
  Administered 2022-02-18 – 2022-02-19 (×3): 12.5 mg via ORAL
  Filled 2022-02-18 (×3): qty 1

## 2022-02-18 MED ORDER — INSULIN ASPART 100 UNIT/ML IJ SOLN
0.0000 [IU] | Freq: Every day | INTRAMUSCULAR | Status: DC
  Administered 2022-02-18: 4 [IU] via SUBCUTANEOUS
  Filled 2022-02-18 (×2): qty 1

## 2022-02-18 MED ORDER — SENNA 8.6 MG PO TABS: 2.0000 | ORAL_TABLET | Freq: Every evening | ORAL | Status: DC | PRN

## 2022-02-18 MED ORDER — BUPROPION HCL ER (XL) 150 MG PO TB24
150.0000 mg | ORAL_TABLET | Freq: Every day | ORAL | Status: DC
  Administered 2022-02-18 – 2022-02-19 (×2): 150 mg via ORAL
  Filled 2022-02-18 (×2): qty 1

## 2022-02-18 MED ORDER — BUDESONIDE 0.25 MG/2ML IN SUSP
0.2500 mg | Freq: Two times a day (BID) | RESPIRATORY_TRACT | Status: DC
  Administered 2022-02-18 – 2022-02-19 (×2): 0.25 mg via RESPIRATORY_TRACT
  Filled 2022-02-18 (×2): qty 2

## 2022-02-18 MED ORDER — IPRATROPIUM-ALBUTEROL 0.5-2.5 (3) MG/3ML IN SOLN: 3.0000 mL | RESPIRATORY_TRACT | Status: DC | PRN

## 2022-02-18 MED ORDER — UMECLIDINIUM BROMIDE 62.5 MCG/ACT IN AEPB: 1.0000 | INHALATION_SPRAY | Freq: Every day | RESPIRATORY_TRACT | Status: DC

## 2022-02-18 MED ORDER — OSELTAMIVIR PHOSPHATE 30 MG PO CAPS
30.0000 mg | ORAL_CAPSULE | Freq: Every day | ORAL | Status: DC
  Administered 2022-02-18: 30 mg via ORAL
  Filled 2022-02-18 (×3): qty 1

## 2022-02-18 MED ORDER — SENNOSIDES-DOCUSATE SODIUM 8.6-50 MG PO TABS
1.0000 | ORAL_TABLET | Freq: Two times a day (BID) | ORAL | Status: DC
  Administered 2022-02-18 – 2022-02-19 (×3): 1 via ORAL
  Filled 2022-02-18 (×3): qty 1

## 2022-02-18 MED ORDER — LINAGLIPTIN 5 MG PO TABS
5.0000 mg | ORAL_TABLET | Freq: Every day | ORAL | Status: DC
  Administered 2022-02-18 – 2022-02-19 (×2): 5 mg via ORAL
  Filled 2022-02-18 (×2): qty 1

## 2022-02-18 NOTE — Assessment & Plan Note (Signed)
-   Metoprolol 12.5 mg p.o. twice daily presumed

## 2022-02-18 NOTE — Assessment & Plan Note (Signed)
-   Resumed home senna docusate twice daily

## 2022-02-18 NOTE — ED Notes (Signed)
Report received care assumed.

## 2022-02-18 NOTE — Progress Notes (Signed)
[facility] PROGRESS NOTE  [name]. KXF:[id number] DOB: 10/31/1930 DOA: 02/17/2022 [facility]: [facility] [place]  Status Remains inpatient appropriate because: Continues to straight significant respiratory symptoms secondary to his influenza A as evidenced by diffuse wheezing and coughing.  Acute kidney injury is beginning to resolve and it is hopeful that by 12/27 patient will be appropriate for discharge   Level of care: Med-Surg   Code Status: DNR Family Communication: Patient only DVT prophylaxis: Subcutaneous heparin  HPI: 88 year old male history of hypertension, dyslipidemia, history of four-vessel CABG in 1989, PAD status post right SFA stent, CKD 3, spina bifida wheelchair-bound state and remote COVID-19 infection.  He also has a history of DVT as well as PTSD/insomnia/depression.  Patient underwent AKA surgery for osteomyelitis in 2021.  He presented to the ER with reports of shortness of breath.  Room air sats at rest were 98%.  Labs were unremarkable except for slight elevation in glucose of 252.  Patient did test positive for influenza A.  Subjective: Reporting that he is hungry.  States he is still short of breath but not as bad as prior to admission.  He continues to have significant wheezing on exam at rest  Objective: Vitals:   02/18/22 1420 02/18/22 1457  BP: (!) 151/69   Pulse: 75 76  Resp: 18 16  Temp: 97.8 F (36.6 C)   SpO2: 100% 99%    Intake/Output Summary (Last 24 hours) at 02/18/2022 1521 Last data filed at 02/18/2022 1420 Gross per 24 hour  Intake --  Output 800 ml  Net -800 ml   Filed Weights   02/17/22 1143  Weight: 102 kg    Exam:  Constitutional: NAD, calm, comfortable Respiratory: Diffuse wheezing both to stethoscope exam as well as audible wheezing.  Episodic paroxysmal coughing that is nonproductive.  No crackles. Normal respiratory effort. No accessory muscle use.  Cardiovascular: Regular rate and rhythm, no murmurs  / rubs / gallops. No extremity edema. 2+ pedal pulses.  Abdomen: no tenderness, no masses palpated. No hepatosplenomegaly. Bowel sounds positive.  Musculoskeletal: no clubbing / cyanosis. No joint deformity upper and lower extremities. Good ROM, no contractures. Normal muscle tone.  Prior right AKA. Skin: no rashes, lesions, ulcers. No induration Neurologic: CN 2-12 grossly intact. Sensation intact, DTR normal. Strength 5/5 x all 4 extremities.  Psychiatric: Normal judgment and insight. Alert and oriented x 3. Normal mood.    Assessment/Plan: *Acute COPD exacerbation/influenza A - Continue DuoNebs transition to [employer] and Incruse Ellipta in 24 hours -Add budesonide nebs -Monitor for worsening of respiratory status -Procalcitonin negative/within normal limits   Influenza A -Resume Tamiflu   Stage 3b chronic kidney disease (CKD) (HCC) Creatinine at presentation was 2.15 and with hydration has decreased to baseline of 1.8   Chronic constipation - Continue home senna docusate twice daily   Essential hypertension - Continue metoprolol 12.5 mg p.o. twice daily   Type 2 diabetes mellitus with stage 3 chronic kidney disease, without long-term current use of insulin (HCC) Resume diet Therapeutic substitution Tradjenta Continue SSI with at bedtime coverage ordered    Data Reviewed: Basic Metabolic Panel: Recent Labs  Lab 02/17/22 1224 02/18/22 0439  NA 139 133*  K 4.3 4.5  CL 110 106  CO2 23 22  GLUCOSE 252* 282*  BUN 34* 33*  CREATININE 2.15* 1.80*  CALCIUM 8.0* 7.8*  MG 2.2  --   PHOS 3.2  --    Liver Function Tests: Recent Labs  Lab 02/17/22  1224  AST 15  ALT 9  ALKPHOS 66  BILITOT 0.5  PROT 6.1*  ALBUMIN 2.9*   No results for input(s): "LIPASE", "AMYLASE" in the last 168 hours. No results for input(s): "AMMONIA" in the last 168 hours. CBC: Recent Labs  Lab 02/17/22 1224 02/18/22 0439  WBC 9.9 9.3  NEUTROABS 7.8*  --   HGB 10.3* 10.1*  HCT 33.3*  32.3*  MCV 95.1 93.6  PLT 194 173   Cardiac Enzymes: No results for input(s): "CKTOTAL", "CKMB", "CKMBINDEX", "TROPONINI" in the last 168 hours. BNP (last 3 results) Recent Labs    02/17/22 1224  BNP 81.0    ProBNP (last 3 results) No results for input(s): "PROBNP" in the last 8760 hours.  CBG: Recent Labs  Lab 02/18/22 0141 02/18/22 0815 02/18/22 1303  GLUCAP 305* 166* 105*    Recent Results (from the past 240 hour(s))  Resp panel by RT-PCR (RSV, Flu A&B, Covid) Anterior Nasal Swab     Status: Abnormal   Collection Time: 02/17/22 12:24 PM   Specimen: Anterior Nasal Swab  Result Value Ref Range Status   SARS Coronavirus 2 by RT PCR NEGATIVE NEGATIVE Final    Comment: (NOTE) SARS-CoV-2 target nucleic acids are NOT DETECTED.  The SARS-CoV-2 RNA is generally detectable in upper respiratory specimens during the acute phase of infection. The lowest concentration of SARS-CoV-2 viral copies this assay can detect is 138 copies/mL. A negative result does not preclude SARS-Cov-2 infection and should not be used as the sole basis for treatment or other patient management decisions. A negative result may occur with  improper specimen collection/handling, submission of specimen other than nasopharyngeal swab, presence of viral mutation(s) within the areas targeted by this assay, and inadequate number of viral copies(<138 copies/mL). A negative result must be combined with clinical observations, patient history, and epidemiological information. The expected result is Negative.  Fact Sheet for Patients:  EntrepreneurPulse.com.au  Fact Sheet for Healthcare Providers:  IncredibleEmployment.be  This test is no t yet approved or cleared by the [place] FDA and  has been authorized for detection and/or diagnosis of SARS-CoV-2 by FDA under an Emergency Use Authorization (EUA). This EUA will remain  in effect (meaning this test can be used)  for the duration of the COVID-19 declaration under Section 564(b)(1) of the Act, 21 U.S.C.section 360bbb-3(b)(1), unless the authorization is terminated  or revoked sooner.       Influenza A by PCR POSITIVE (A) NEGATIVE Final   Influenza B by PCR NEGATIVE NEGATIVE Final    Comment: (NOTE) The Xpert Xpress SARS-CoV-2/FLU/RSV plus assay is intended as an aid in the diagnosis of influenza from Nasopharyngeal swab specimens and should not be used as a sole basis for treatment. Nasal washings and aspirates are unacceptable for Xpert Xpress SARS-CoV-2/FLU/RSV testing.  Fact Sheet for Patients: EntrepreneurPulse.com.au  Fact Sheet for Healthcare Providers: IncredibleEmployment.be  This test is not yet approved or cleared by the [place] FDA and has been authorized for detection and/or diagnosis of SARS-CoV-2 by FDA under an Emergency Use Authorization (EUA). This EUA will remain in effect (meaning this test can be used) for the duration of the COVID-19 declaration under Section 564(b)(1) of the Act, 21 U.S.C. section 360bbb-3(b)(1), unless the authorization is terminated or revoked.     Resp Syncytial Virus by PCR NEGATIVE NEGATIVE Final    Comment: (NOTE) Fact Sheet for Patients: EntrepreneurPulse.com.au  Fact Sheet for Healthcare Providers: IncredibleEmployment.be  This test is not yet approved or cleared by  the [employer] and has been authorized for detection and/or diagnosis of SARS-CoV-2 by FDA under an Emergency Use Authorization (EUA). This EUA will remain in effect (meaning this test can be used) for the duration of the COVID-19 declaration under Section 564(b)(1) of the Act, 21 U.S.C. section 360bbb-3(b)(1), unless the authorization is terminated or revoked.  Performed at [facility], [address] [place], [place] [address]   Blood culture (single)     Status: None  (Preliminary result)   Collection Time: 02/17/22 12:24 PM   Specimen: Right Antecubital; Blood  Result Value Ref Range Status   Specimen Description RIGHT ANTECUBITAL  Final   Special Requests   Final    BOTTLES DRAWN AEROBIC AND ANAEROBIC Blood Culture results may not be optimal due to an excessive volume of blood received in culture bottles   Culture   Final    NO GROWTH < 24 HOURS Performed at [facility], [address], [place], [place] [address]    Report Status PENDING  Incomplete     Studies: DG Chest Portable 1 View  Result Date: 02/17/2022 CLINICAL DATA:  SOB EXAM: PORTABLE CHEST 1 VIEW COMPARISON:  September 28, 2021 FINDINGS: The cardiomediastinal silhouette is unchanged in contour.Status post median sternotomy. No pleural effusion. No pneumothorax. Low lung volume radiograph. Similar coarse reticular opacities of the bases likely reflecting underlying pulmonary fibrosis. No acute pleuroparenchymal abnormality. IMPRESSION: No acute cardiopulmonary abnormality. Electronically Signed   By: Valentino Saxon M.D.   On: 02/17/2022 12:08    Scheduled Meds:  buPROPion  150 mg Oral Daily   clopidogrel  75 mg Oral Daily   ezetimibe  10 mg Oral QHS   fluticasone furoate-vilanterol  1 puff Inhalation Daily   And   umeclidinium bromide  1 puff Inhalation Daily   heparin  5,000 Units Subcutaneous Q8H   insulin aspart  0-5 Units Subcutaneous QHS   insulin aspart  0-9 Units Subcutaneous TID WC   linagliptin  5 mg Oral Daily   metoprolol tartrate  12.5 mg Oral BID   oseltamivir  30 mg Oral QAC supper   pregabalin  50 mg Oral BID   senna-docusate  1 tablet Oral BID   simvastatin  20 mg Oral QHS   Continuous Infusions:  sodium chloride 100 mL/hr at 02/18/22 0139    Principal Problem:   COPD exacerbation ([place]) Active Problems:   Type 2 diabetes mellitus with stage 3 chronic kidney disease, without long-term current use of insulin ([place])   PTSD   Essential hypertension    Spina bifida ([place])   Chronic constipation   Stage 3b chronic kidney disease (CKD) (HCC)   S/P AKA (above knee amputation), right ([place])   Influenza A   COPD with acute exacerbation ([place])   Consultants: None  Procedures: None  Antibiotics: None   Time spent: 35 minutes    Erin Hearing ANP  [facility] 7 am - 330 pm/M-F for direct patient care and secure chat Please refer to Amion for contact info 0  days

## 2022-02-18 NOTE — ED Notes (Signed)
Report given to [employer]

## 2022-02-18 NOTE — Assessment & Plan Note (Signed)
-   Holding home p.o. antiglycemic agents - Insulin SSI with at bedtime coverage ordered

## 2022-02-18 NOTE — Assessment & Plan Note (Signed)
-   Outside window for Tamiflu

## 2022-02-18 NOTE — Assessment & Plan Note (Signed)
-   With elevated serum creatinine, possible acute kidney injury - Sodium chloride 125 mL/h, 1 day ordered

## 2022-02-19 DIAGNOSIS — J441 Chronic obstructive pulmonary disease with (acute) exacerbation: Secondary | ICD-10-CM | POA: Diagnosis not present

## 2022-02-19 LAB — GLUCOSE, CAPILLARY
Glucose-Capillary: 99 mg/dL (ref 70–99)
Glucose-Capillary: 137 mg/dL — ABNORMAL HIGH (ref 70–99)
Glucose-Capillary: 139 mg/dL — ABNORMAL HIGH (ref 70–99)

## 2022-02-19 MED ORDER — OSELTAMIVIR PHOSPHATE 30 MG PO CAPS: 30.0000 mg | ORAL_CAPSULE | Freq: Every day | ORAL | 0 refills | Status: AC

## 2022-02-19 MED ORDER — SENNOSIDES-DOCUSATE SODIUM 8.6-50 MG PO TABS: 1.0000 | ORAL_TABLET | Freq: Every evening | ORAL | 1 refills | Status: AC | PRN

## 2022-02-19 NOTE — TOC Transition Note (Signed)
Transition of Care [facility]) - CM/SW Discharge Note   Patient Details  Name: [name]. MRN: [id number] Date of Birth: [birth year]  [facility] of [facility]) CM/SW Contact:  Beverly Sessions, RN Phone Number: 02/19/2022, 3:25 PM   Clinical Narrative:      [facility] will DC to: [facility] Anticipated DC date: 02/19/22  Family notified:Patient has updated son Transport by: [facility]  Per MD patient ready for DC to . RN, patient, patient's family, and facility notified of DC. Discharge Summary sent to facility. RN given number for report. DC packet on chart and signed DNR on chart confirmed by nurse. Ambulance transport requested for patient.  TOC signing off.  Isaias Cowman [facility] [phone]        Patient Goals and CMS Choice      Discharge Placement                         Discharge Plan and Services Additional resources added to the After Visit Summary for                                       Social Determinants of Health (SDOH) Interventions SDOH Screenings   Food Insecurity: No Food Insecurity (02/18/2022)  Housing: Low Risk  (02/18/2022)  Transportation Needs: No Transportation Needs (02/18/2022)  Utilities: Not At Risk (02/18/2022)  Depression (PHQ2-9): Low Risk  (04/07/2019)  Tobacco Use: Medium Risk (02/17/2022)     Readmission Risk Interventions     No data to display

## 2022-02-19 NOTE — NC FL2 (Signed)
[place] LEVEL OF CARE FORM     IDENTIFICATION  Patient Name: [name]. Birthdate: 02-21-1931 Sex: male Admission Date (Current Location): 02/17/2022  [facility] and [place] Number:  Engineering geologist and Address:  [facility], [address], [place], [place] [address]      Provider Number: [id number]  Attending Physician Name and Address:  Desiree Hane, MD  Relative Name and Phone Number:       Current Level of Care: Hospital Recommended Level of Care: Nursing Facility Prior Approval Number:    Date Approved/Denied:   PASRR Number: [id number]  Discharge Plan: SNF    Current Diagnoses: Patient Active Problem List   Diagnosis Date Noted   COPD with acute exacerbation ([place]) 02/18/2022   COPD exacerbation ([place]) 02/17/2022   Influenza A 02/17/2022   Bilateral pneumonia 03/03/2020   Stage 3b chronic kidney disease (CKD) ([place]) 03/02/2020   Acute pyelonephritis 03/02/2020   S/P AKA (above knee amputation), right ([place]) 03/02/2020   Sepsis ([place]) 03/02/2020   Pyelonephritis 03/02/2020   PVD (peripheral vascular disease) ([place]) 03/25/2019   Osteomyelitis of right foot ([place]) 03/25/2019   Frequency of urination and polyuria 03/25/2019   Atherosclerosis of native arteries of the extremities with ulceration ([place]) 03/14/2019   COVID-19 virus infection 03/14/2019   Sacral pressure sore 11/25/2018   Arterial hemorrhage 11/18/2018   Hypoglycemia associated with type 2 diabetes mellitus ([place]) 11/18/2018   General weakness 09/22/2016   Bilateral carpal tunnel syndrome 09/22/2016   Leukocytosis 07/30/2015   Urinary incontinence 12/13/2012   Chronic constipation [id number]   Metabolic syndrome [id number]   Spina bifida ([place]) 11/18/2012   CAD (coronary artery disease) 03/08/2009   Type 2 diabetes mellitus with diabetic nephropathy ([place]) 10/29/2008   Type 2 diabetes mellitus with stage 3 chronic kidney disease, without long-term  current use of insulin ([place]) 10/29/2008   PTSD 10/29/2008   Essential hypertension 10/29/2008    Orientation RESPIRATION BLADDER Height & Weight     Self, Time, Situation, Place  Normal Incontinent Weight: 102 kg Height:  '5\' 8"'$  (172.7 cm)  BEHAVIORAL SYMPTOMS/MOOD NEUROLOGICAL BOWEL NUTRITION STATUS      Incontinent Diet (Heart Healthy Carb modified)  AMBULATORY STATUS COMMUNICATION OF NEEDS Skin   Extensive Assist Verbally PU Stage and Appropriate Care                       Personal Care Assistance Level of Assistance              Functional Limitations Info             SPECIAL CARE FACTORS FREQUENCY                       Contractures Contractures Info: Not present    Additional Factors Info  Code Status, Allergies, Isolation Precautions (Droplet for the flu) Code Status Info: DNR Allergies Info: Sulfonamide Derivatives, Aspirin, Nitrofurantoin, Penicillins, Sulfa Antibiotics     Isolation Precautions Info: flu     Current Medications (02/19/2022):  This is the current hospital active medication list Current Facility-Administered Medications  Medication Dose Route Frequency Provider Last Rate Last Admin   acetaminophen (TYLENOL) tablet 650 mg  650 mg Oral Q6H PRN Cox, Amy N, DO       Or   acetaminophen (TYLENOL) suppository 650 mg  650 mg Rectal Q6H PRN Cox, Amy N, DO       budesonide (PULMICORT)  nebulizer solution 0.25 mg  0.25 mg Nebulization BID Samella Parr, NP   0.25 mg at 02/19/22 [id number]   buPROPion (WELLBUTRIN XL) 24 hr tablet 150 mg  150 mg Oral Daily Cox, Amy N, DO   150 mg at 02/19/22 1019   clopidogrel (PLAVIX) tablet 75 mg  75 mg Oral Daily Cox, Amy N, DO   75 mg at 02/19/22 1019   ezetimibe (ZETIA) tablet 10 mg  10 mg Oral QHS Cox, Amy N, DO   10 mg at 02/18/22 2134   fluticasone furoate-vilanterol (BREO ELLIPTA) 100-25 MCG/ACT 1 puff  1 puff Inhalation Daily Cox, Amy N, DO       And   umeclidinium bromide (INCRUSE ELLIPTA) 62.5  MCG/ACT 1 puff  1 puff Inhalation Daily Cox, Amy N, DO       heparin injection 5,000 Units  5,000 Units Subcutaneous Q8H Cox, Amy N, DO   5,000 Units at 02/19/22 0554   insulin aspart (novoLOG) injection 0-5 Units  0-5 Units Subcutaneous QHS Cox, Amy N, DO   4 Units at 02/18/22 0145   insulin aspart (novoLOG) injection 0-9 Units  0-9 Units Subcutaneous TID WC Cox, Amy N, DO   1 Units at 02/19/22 1252   ipratropium-albuterol (DUONEB) 0.5-2.5 (3) MG/3ML nebulizer solution 3 mL  3 mL Nebulization Q4H PRN Samella Parr, NP       linagliptin (TRADJENTA) tablet 5 mg  5 mg Oral Daily Samella Parr, NP   5 mg at 02/19/22 1019   metoprolol tartrate (LOPRESSOR) tablet 12.5 mg  12.5 mg Oral BID Cox, Amy N, DO   12.5 mg at 02/19/22 1019   ondansetron (ZOFRAN) tablet 4 mg  4 mg Oral Q6H PRN Cox, Amy N, DO       Or   ondansetron (ZOFRAN) injection 4 mg  4 mg Intravenous Q6H PRN Cox, Amy N, DO       oseltamivir (TAMIFLU) capsule 30 mg  30 mg Oral QAC supper Shalhoub, Sherryll Burger, MD   30 mg at 02/18/22 1753   pregabalin (LYRICA) capsule 50 mg  50 mg Oral BID Cox, Amy N, DO   50 mg at 02/19/22 1019   senna (SENOKOT) tablet 17.2 mg  2 tablet Oral QHS PRN Cox, Amy N, DO       senna-docusate (Senokot-S) tablet 1 tablet  1 tablet Oral BID Cox, Amy N, DO   1 tablet at 02/19/22 1019   simvastatin (ZOCOR) tablet 20 mg  20 mg Oral QHS Cox, Amy N, DO   20 mg at 02/18/22 2133     Discharge Medications: Please see discharge summary for a list of discharge medications.  Relevant Imaging Results:  Relevant Lab Results:   Additional Information ss [id number]  Beverly Sessions, RN

## 2022-02-19 NOTE — Plan of Care (Signed)
  Problem: Education: Goal: Ability to describe self-care measures that may prevent or decrease complications (Diabetes Survival Skills Education) will improve Outcome: Progressing Goal: Individualized Educational Video(s) Outcome: Progressing   Problem: Coping: Goal: Ability to adjust to condition or change in health will improve Outcome: Progressing   Problem: Fluid Volume: Goal: Ability to maintain a balanced intake and output will improve Outcome: Progressing   Problem: Health Behavior/Discharge Planning: Goal: Ability to identify and utilize available resources and services will improve Outcome: Progressing Goal: Ability to manage health-related needs will improve Outcome: Progressing   Problem: Skin Integrity: Goal: Risk for impaired skin integrity will decrease Outcome: Progressing   Problem: Tissue Perfusion: Goal: Adequacy of tissue perfusion will improve Outcome: Progressing   Problem: Clinical Measurements: Goal: Ability to maintain clinical measurements within normal limits will improve Outcome: Progressing Goal: Will remain free from infection Outcome: Progressing Goal: Diagnostic test results will improve Outcome: Progressing Goal: Respiratory complications will improve Outcome: Progressing Goal: Cardiovascular complication will be avoided Outcome: Progressing   Problem: Nutrition: Goal: Adequate nutrition will be maintained Outcome: Progressing   Problem: Safety: Goal: Ability to remain free from injury will improve Outcome: Progressing

## 2022-02-19 NOTE — Discharge Summary (Addendum)
Physician Discharge Summary   Patient: [name]. MRN: [id number] DOB: 01/23/1931  Admit date:     02/17/2022  Discharge date: 02/19/22  Discharge Physician: Desiree Hane   PCP: [facility] [place]   Recommendations at discharge:   Continue Tamiflu for 3 additional doses to complete full 5 day course Continue home Trelegy and Duoneb and albuterol)  Discharge Diagnoses: Active Problems:   Type 2 diabetes mellitus with stage 3 chronic kidney disease, without long-term current use of insulin (HCC)   PTSD   Essential hypertension   Spina bifida ([place])   Chronic constipation   Stage 3b chronic kidney disease (CKD) (HCC)   S/P AKA (above knee amputation), right (HCC)   Influenza A   COPD with acute exacerbation (HCC)  Principal Problem (Resolved):   COPD exacerbation [facility])  Hospital Course:  [name] is a 88 yo male with medical history concerning for COPD, CAD s/p CABG (1989) and PAD s/p right SFA stent, CKD Stage 3, T2DM, wheelchair bound state with R AKA secondary to PVD who presented on 12/25 with three days of worsening shortness of breath, cough, fatigue and wheezing.  On evaluation by EMS he was found to have increased work of breathing and low oxygen saturation requiring albuterol and duo neb nebulizer treatment and IV Solumedrol.  Upon evaluation he was admitted for acute hypoxic respiratory failure secondary to influenza A complicated by COPD exacerbation.    On admission he was treated with scheduled nebulizer treatment and tamiflu plus supportive care.  His hospitalization was complicated by AKI on CKD Stage 3 secondary to dehydration in setting of viral infection that improved with fluids.   He was able to be weaned to room air prior to discharge and maintain normal respiratory status on equivalent home inhaler regimen plus remainder of tamiflu.  Assessment and Plan: Acute COPD exacerbation secondary to Influenza A, improved Doing well on room air. Can  return to previous activity ( wheelchair bound) Still some residual wheezing on exam but no increased respiratory effort, only occasional cough. Doing well on home regimen -Continue home trelegy scheduled and PRN duoneb and albutterol,  -PRN mucinex   Influenza A, positive on admission - Continue Tamiflu for 3 additional doses including discharge day (12/27) to complete 5 days of total therapy  Acue on chronic Stage 3b  kidney disease, stable Peak Creatinine of 2.15 during hospitalization returned to baseline of 1.8 with IVF and maintained on oral intake. AKI likely pre-renal given quick improvement with fluids.  - consider repeat BMP as outpatient with PCP  Chronic constipation - Resume home senna docusate twice daily  Essential hypertension - Continue Metoprolol 12.5 mg p.o. twice daily   Type 2 diabetes mellitus with stage 3 chronic kidney disease, without long-term current use of insulin (HCC) - Continue home januvia  CAD, stable -continue home aspirin and plavix  Depression, stable -continue home wellbutrin  HLD, stable -continue home simvstatin and zetia        Consultants: none Procedures performed: none  Disposition: Skilled nursing facility Diet recommendation:  Renal diet DISCHARGE MEDICATION: Allergies as of 02/19/2022       Reactions   Sulfonamide Derivatives    REACTION: pruitis patient cant remember its been so long    Aspirin Rash   In high doses   Nitrofurantoin Rash   Penicillins Rash   Did it involve swelling of the face/tongue/throat, SOB, or low BP? No Did it involve sudden or severe rash/hives, skin peeling, or  any reaction on the inside of your mouth or nose? No Did you need to seek medical attention at a hospital or doctor's office? No When did it last happen?      Teenager If all above answers are "NO", may proceed with cephalosporin use.   Sulfa Antibiotics Rash        Medication List     STOP taking these medications     cefpodoxime 200 MG tablet Commonly known as: VANTIN   Janumet 50-500 MG tablet Generic drug: sitaGLIPtin-metformin       TAKE these medications    12 Hour Nasal Decongestant 0.05 % nasal spray Generic drug: oxymetazoline Place 2 sprays into both nostrils 2 (two) times daily. For 3 days   acetaminophen 325 MG tablet Commonly known as: TYLENOL Take 650 mg by mouth every 6 (six) hours as needed.   albuterol 108 (90 Base) MCG/ACT inhaler Commonly known as: VENTOLIN HFA Inhale 2 puffs into the lungs every 6 (six) hours as needed for wheezing or shortness of breath.   Artificial Tears 0.2-0.2-1 % Soln Generic drug: Glycerin-Hypromellose-PEG 400 Apply to eye 3 (three) times daily. Instill 2 drops into each eye 3 times daily.   aspirin 81 MG tablet Take 81 mg by mouth daily.   buPROPion 150 MG 24 hr tablet Commonly known as: WELLBUTRIN XL Take 150 mg by mouth daily.   clopidogrel 75 MG tablet Commonly known as: Plavix Take 1 tablet (75 mg total) by mouth daily.   Co Q-10 100 MG Caps Take 1 capsule by mouth daily.   docusate sodium 100 MG capsule Commonly known as: COLACE Take 100 mg by mouth at bedtime.   ezetimibe 10 MG tablet Commonly known as: ZETIA Take 10 mg by mouth at bedtime.   Generlac 10 GM/15ML Soln Generic drug: lactulose (encephalopathy) Take 30 mLs by mouth every Monday, Wednesday, and Friday.   Iodoflex 0.9 % Pads Generic drug: Cadexomer Iodine Apply 1 application topically See admin instructions. Twice a week heel   ipratropium-albuterol 0.5-2.5 (3) MG/3ML Soln Commonly known as: DUONEB 1 NEBULIZER SOLUTION (3ML) BY ORAL INHALATION EVERY 4 TO 6 HOURS AS NEEDED   melatonin 5 MG Tabs Take 5 mg by mouth at bedtime.   metoprolol tartrate 25 MG tablet Commonly known as: LOPRESSOR Take 0.5 tablets (12.5 mg total) by mouth 2 (two) times daily.   Mucinex DM 30-600 MG Tb12 Take 1 tablet by mouth 2 (two) times daily. For 5 days   nystatin  powder Commonly known as: MYCOSTATIN/NYSTOP Apply topically 2 (two) times daily. Apply to groin and perineum twice a day   omeprazole 20 MG capsule Commonly known as: PRILOSEC Take 20 mg by mouth daily.   oseltamivir 30 MG capsule Commonly known as: TAMIFLU Take 1 capsule (30 mg total) by mouth daily before supper for 3 doses.   polyethylene glycol powder 17 GM/SCOOP powder Commonly known as: GLYCOLAX/MIRALAX Take 17 g by mouth daily.   pregabalin 50 MG capsule Commonly known as: LYRICA Take 50 mg by mouth 2 (two) times daily.   pregabalin 25 MG capsule Commonly known as: LYRICA Take 25 mg by mouth as needed.   PRESERVISION AREDS 2 PO Take 1 capsule by mouth 2 (two) times daily.   senna 8.6 MG tablet Commonly known as: SENOKOT Take 2 tablets by mouth 2 (two) times daily.   senna-docusate 8.6-50 MG tablet Commonly known as: Senokot-S Take 1 tablet by mouth at bedtime as needed for mild constipation.   simethicone  80 MG chewable tablet Commonly known as: Gas-X Chew 1 tablet (80 mg total) by mouth 4 (four) times daily as needed for flatulence.   simvastatin 20 MG tablet Commonly known as: ZOCOR Take 20 mg by mouth at bedtime.   sitaGLIPtin 50 MG tablet Commonly known as: JANUVIA Take 50 mg by mouth daily.   Trelegy Ellipta 100-62.5-25 MCG/ACT Aepb Generic drug: Fluticasone-Umeclidin-Vilant Inhale 1 puff into the lungs daily.               Discharge Care Instructions  (From admission, onward)           Start     Ordered   02/19/22 0000  Discharge wound care:       Comments: Turn patient frequently to prevent exacerbation of pressure wound   02/19/22 1620            Contact information for after-discharge care     Destination     [facility] .   Service: Skilled Nursing Contact information: [address] [name] [phone]                    Discharge Exam: Danley Danker  Weights   02/17/22 1143  Weight: 102 kg   Alert and oriented x3, resting comfortably RRR, no murmurs, no edema Normal respiratory effort, on room air, occasional wheeze at bases and rhonchi otherwise clear Soft abdomen, non -distended R AKA  Condition at discharge: stable  The results of significant diagnostics from this hospitalization (including imaging, microbiology, ancillary and laboratory) are listed below for reference.   Imaging Studies: DG Chest Portable 1 View  Result Date: 02/17/2022 CLINICAL DATA:  SOB EXAM: PORTABLE CHEST 1 VIEW COMPARISON:  September 28, 2021 FINDINGS: The cardiomediastinal silhouette is unchanged in contour.Status post median sternotomy. No pleural effusion. No pneumothorax. Low lung volume radiograph. Similar coarse reticular opacities of the bases likely reflecting underlying pulmonary fibrosis. No acute pleuroparenchymal abnormality. IMPRESSION: No acute cardiopulmonary abnormality. Electronically Signed   By: Valentino Saxon M.D.   On: 02/17/2022 12:08    Microbiology: Results for orders placed or performed during the hospital encounter of 02/17/22  Resp panel by RT-PCR (RSV, Flu A&B, Covid) Anterior Nasal Swab     Status: Abnormal   Collection Time: 02/17/22 12:24 PM   Specimen: Anterior Nasal Swab  Result Value Ref Range Status   SARS Coronavirus 2 by RT PCR NEGATIVE NEGATIVE Final    Comment: (NOTE) SARS-CoV-2 target nucleic acids are NOT DETECTED.  The SARS-CoV-2 RNA is generally detectable in upper respiratory specimens during the acute phase of infection. The lowest concentration of SARS-CoV-2 viral copies this assay can detect is 138 copies/mL. A negative result does not preclude SARS-Cov-2 infection and should not be used as the sole basis for treatment or other patient management decisions. A negative result may occur with  improper specimen collection/handling, submission of specimen other than nasopharyngeal swab, presence of viral  mutation(s) within the areas targeted by this assay, and inadequate number of viral copies(<138 copies/mL). A negative result must be combined with clinical observations, patient history, and epidemiological information. The expected result is Negative.  Fact Sheet for Patients:  EntrepreneurPulse.com.au  Fact Sheet for Healthcare Providers:  IncredibleEmployment.be  This test is no t yet approved or cleared by the [place] FDA and  has been authorized for detection and/or diagnosis of SARS-CoV-2 by FDA under an Emergency Use Authorization (EUA). This EUA will remain  in effect (  meaning this test can be used) for the duration of the COVID-19 declaration under Section 564(b)(1) of the Act, 21 U.S.C.section 360bbb-3(b)(1), unless the authorization is terminated  or revoked sooner.       Influenza A by PCR POSITIVE (A) NEGATIVE Final   Influenza B by PCR NEGATIVE NEGATIVE Final    Comment: (NOTE) The Xpert Xpress SARS-CoV-2/FLU/RSV plus assay is intended as an aid in the diagnosis of influenza from Nasopharyngeal swab specimens and should not be used as a sole basis for treatment. Nasal washings and aspirates are unacceptable for Xpert Xpress SARS-CoV-2/FLU/RSV testing.  Fact Sheet for Patients: EntrepreneurPulse.com.au  Fact Sheet for Healthcare Providers: IncredibleEmployment.be  This test is not yet approved or cleared by the [place] FDA and has been authorized for detection and/or diagnosis of SARS-CoV-2 by FDA under an Emergency Use Authorization (EUA). This EUA will remain in effect (meaning this test can be used) for the duration of the COVID-19 declaration under Section 564(b)(1) of the Act, 21 U.S.C. section 360bbb-3(b)(1), unless the authorization is terminated or revoked.     Resp Syncytial Virus by PCR NEGATIVE NEGATIVE Final    Comment: (NOTE) Fact Sheet for  Patients: EntrepreneurPulse.com.au  Fact Sheet for Healthcare Providers: IncredibleEmployment.be  This test is not yet approved or cleared by the [place] FDA and has been authorized for detection and/or diagnosis of SARS-CoV-2 by FDA under an Emergency Use Authorization (EUA). This EUA will remain in effect (meaning this test can be used) for the duration of the COVID-19 declaration under Section 564(b)(1) of the Act, 21 U.S.C. section 360bbb-3(b)(1), unless the authorization is terminated or revoked.  Performed at [facility], [address] [place], [place] [address]   Blood culture (single)     Status: None (Preliminary result)   Collection Time: 02/17/22 12:24 PM   Specimen: Right Antecubital; Blood  Result Value Ref Range Status   Specimen Description RIGHT ANTECUBITAL  Final   Special Requests   Final    BOTTLES DRAWN AEROBIC AND ANAEROBIC Blood Culture results may not be optimal due to an excessive volume of blood received in culture bottles   Culture   Final    NO GROWTH 2 DAYS Performed at [facility], [address] [place], [place] [address]    Report Status PENDING  Incomplete    Labs: CBC: Recent Labs  Lab 02/17/22 1224 02/18/22 0439  WBC 9.9 9.3  NEUTROABS 7.8*  --   HGB 10.3* 10.1*  HCT 33.3* 32.3*  MCV 95.1 93.6  PLT 194 324   Basic Metabolic Panel: Recent Labs  Lab 02/17/22 1224 02/18/22 0439  NA 139 133*  K 4.3 4.5  CL 110 106  CO2 23 22  GLUCOSE 252* 282*  BUN 34* 33*  CREATININE 2.15* 1.80*  CALCIUM 8.0* 7.8*  MG 2.2  --   PHOS 3.2  --    Liver Function Tests: Recent Labs  Lab 02/17/22 1224  AST 15  ALT 9  ALKPHOS 66  BILITOT 0.5  PROT 6.1*  ALBUMIN 2.9*   CBG: Recent Labs  Lab 02/18/22 1303 02/18/22 1656 02/18/22 2057 02/19/22 0810 02/19/22 1144  GLUCAP 105* 99 139* 99 137*    Discharge time spent: less than 30 minutes.  Signed: Desiree Hane,  MD [facility] 02/19/2022

## 2022-02-19 NOTE — Plan of Care (Signed)

## 2022-02-22 LAB — CULTURE, BLOOD (SINGLE): Culture: NO GROWTH

## 2022-03-05 ENCOUNTER — Ambulatory Visit (INDEPENDENT_AMBULATORY_CARE_PROVIDER_SITE_OTHER): Payer: Medicare Other | Admitting: Dermatology

## 2022-03-05 VITALS — BP 109/68

## 2022-03-05 DIAGNOSIS — L578 Other skin changes due to chronic exposure to nonionizing radiation: Secondary | ICD-10-CM | POA: Diagnosis not present

## 2022-03-05 DIAGNOSIS — D692 Other nonthrombocytopenic purpura: Secondary | ICD-10-CM | POA: Diagnosis not present

## 2022-03-05 DIAGNOSIS — L570 Actinic keratosis: Secondary | ICD-10-CM

## 2022-03-05 DIAGNOSIS — L820 Inflamed seborrheic keratosis: Secondary | ICD-10-CM

## 2022-03-05 NOTE — Patient Instructions (Addendum)

## 2022-03-05 NOTE — Progress Notes (Unsigned)
   New Patient Visit  Subjective  [name]. is a 89 y.o. male who presents for the following: check spots (L arm x 1 yr, R arm x 33m no symptoms). The patient has spots, moles and lesions to be evaluated, some may be new or changing and the patient has concerns that these could be cancer.  [place] The following portions of the chart were reviewed this encounter and updated as appropriate:   Tobacco  Allergies  Meds  Problems  Med Hx  Surg Hx  Fam Hx     Review of Systems:  No other skin or systemic complaints except as noted in HPI or Assessment and Plan.  Objective  Well appearing patient in no apparent distress; mood and affect are within normal limits.  A focused examination was performed including bil arms. Relevant physical exam findings are noted in the Assessment and Plan.  R forearm at wrist x 2, L forearm x 2, face x 5 (9) Stuck on waxy paps with erythema  R temple x 1 Pink scaly macules   Assessment & Plan   Purpura - Chronic; persistent and recurrent.  Treatable, but not curable. - Violaceous macules and patches - Benign - Related to trauma, age, sun damage and/or use of blood thinners, chronic use of topical and/or oral steroids - Observe - Can use OTC arnica containing moisturizer such as Dermend Bruise Formula if desired - Call for worsening or other concerns  - arms  Actinic Damage - chronic, secondary to cumulative UV radiation exposure/sun exposure over time - diffuse scaly erythematous macules with underlying dyspigmentation - Recommend daily broad spectrum sunscreen SPF 30+ to sun-exposed areas, reapply every 2 hours as needed.  - Recommend staying in the shade or wearing long sleeves, sun glasses (UVA+UVB protection) and wide brim hats (4-inch brim around the entire circumference of the hat). - Call for new or changing lesions.  - arms  Inflamed seborrheic keratosis (9) R forearm at wrist x 2, L forearm x 2,  face x 5  Symptomatic, irritating, patient would like treated.   Destruction of lesion - R forearm at wrist x 2, L forearm x 2, face x 5 Complexity: simple   Destruction method: cryotherapy   Informed consent: discussed and consent obtained   Timeout:  patient name, date of birth, surgical site, and procedure verified Lesion destroyed using liquid nitrogen: Yes   Region frozen until ice ball extended beyond lesion: Yes   Outcome: patient tolerated procedure well with no complications   Post-procedure details: wound care instructions given    AK (actinic keratosis) R temple x 1  Destruction of lesion - R temple x 1 Complexity: simple   Destruction method: cryotherapy   Informed consent: discussed and consent obtained   Timeout:  patient name, date of birth, surgical site, and procedure verified Lesion destroyed using liquid nitrogen: Yes   Region frozen until ice ball extended beyond lesion: Yes   Outcome: patient tolerated procedure well with no complications   Post-procedure details: wound care instructions given     Return if symptoms worsen or fail to improve.  I, SOthelia Pulling RMA, am acting as scribe for DSarina Ser MD . Documentation: I have reviewed the above documentation for accuracy and completeness, and I agree with the above.  DSarina Ser MD

## 2022-03-06 ENCOUNTER — Encounter: Payer: Self-pay | Admitting: Dermatology

## 2022-04-08 ENCOUNTER — Encounter: Payer: Self-pay | Admitting: Pulmonary Disease

## 2022-07-18 ENCOUNTER — Encounter: Payer: Self-pay | Admitting: Pulmonary Disease

## 2022-08-11 ENCOUNTER — Emergency Department
Admission: EM | Admit: 2022-08-11 | Discharge: 2022-08-11 | Disposition: A | Discharge status: Home / Self Care | Payer: Medicare Other | Attending: Student in an Organized Health Care Education/Training Program | Admitting: Student in an Organized Health Care Education/Training Program

## 2022-08-11 ENCOUNTER — Other Ambulatory Visit: Payer: Self-pay

## 2022-08-11 ENCOUNTER — Emergency Department: Payer: Medicare Other

## 2022-08-11 DIAGNOSIS — W44F3XA Food entering into or through a natural orifice, initial encounter: Secondary | ICD-10-CM | POA: Insufficient documentation

## 2022-08-11 DIAGNOSIS — T17228A Food in pharynx causing other injury, initial encounter: Secondary | ICD-10-CM | POA: Insufficient documentation

## 2022-08-11 DIAGNOSIS — R1310 Dysphagia, unspecified: Secondary | ICD-10-CM

## 2022-08-11 NOTE — ED Provider Notes (Signed)
[facility] Provider Note    Event Date/Time   First MD Initiated Contact with Patient 08/11/22 2048     (approximate)   History   Foreign Body   HPI  [name]. is a 89 y.o. male who presents to the hospital for feeling like something is stuck in his throat after he was eating potatoes and fries this evening.  For like several more quite crispy and hard fried and felt like my got stuck in his throat.  Has been able to eat and drink but still feels like something is there.  Denies any shortness of breath or chest pain.  No fevers or chills.     Physical Exam   Triage Vital Signs: ED Triage Vitals  Enc Vitals Group     BP 08/11/22 2046 (!) 150/65     Pulse Rate 08/11/22 2044 88     Resp 08/11/22 2044 18     Temp 08/11/22 2044 98.2 F (36.8 C)     Temp Source 08/11/22 2044 Oral     SpO2 08/11/22 2044 97 %     Weight 08/11/22 2045 214 lb (97.1 kg)     Height 08/11/22 2045 5\' 8"  (1.727 m)     Head Circumference --      Peak Flow --      Pain Score 08/11/22 2044 5     Pain Loc --      Pain Edu? --      Excl. in GC? --     Most recent vital signs: Vitals:   08/11/22 2044 08/11/22 2046  BP:  (!) 150/65  Pulse: 88   Resp: 18   Temp: 98.2 F (36.8 C)   SpO2: 97%      Constitutional: Alert  Eyes: Conjunctivae are normal.  Head: Atraumatic. Nose: No congestion/rhinnorhea. Mouth/Throat: Mucous membranes are moist.  Uvula midline.  No foreign body. Neck: Painless ROM.  Cardiovascular:   Good peripheral circulation. Respiratory: Normal respiratory effort.  No retractions.  Gastrointestinal: Soft and nontender.  Musculoskeletal:  no deformity Neurologic:  MAE spontaneously. No gross focal neurologic deficits are appreciated.  Skin:  Skin is warm, dry and intact. No rash noted. Psychiatric: Mood and affect are normal. Speech and behavior are normal.    ED Results / Procedures / Treatments   Labs (all labs ordered are listed,  but only abnormal results are displayed) Labs Reviewed - No data to display   EKG     RADIOLOGY Please see ED Course for my review and interpretation.  I personally reviewed all radiographic images ordered to evaluate for the above acute complaints and reviewed radiology reports and findings.  These findings were personally discussed with the patient.  Please see medical record for radiology report.    PROCEDURES:  Critical Care performed:   Procedures   MEDICATIONS ORDERED IN ED: Medications - No data to display   IMPRESSION / MDM / ASSESSMENT AND PLAN / ED COURSE  I reviewed the triage vital signs and the nursing notes.                              Differential diagnosis includes, but is not limited to, food bolus impaction, pharyngitis, retained foreign body  Patient arrives in no acute distress he is tolerating p.o.  No sign of uvular edema pharyngitis.  No sign of PTA or RPA.  No foreign body visualized.  No stridor  on exam.  He is speaking in complete phrases.  Will x-ray to make sure that no radiopaque foreign body may have gotten hung up in the upper esophagus.  Does not seem to be having anything obstructing his airway or esophagus.    Clinical Course as of 08/11/22 2238  Mon Aug 11, 2022  2207 X-ray my review and interpretation without evidence of foreign body. [PR]    Clinical Course User Index [PR] Willy Eddy, MD   Patient pleasant well-appearing in no acute distress does appear appropriate for outpatient follow-up.  FINAL CLINICAL IMPRESSION(S) / ED DIAGNOSES   Final diagnoses:  Pain with swallowing     Rx / DC Orders   ED Discharge Orders     None        Note:  This document was prepared using Dragon voice recognition software and may include unintentional dictation errors.    Willy Eddy, MD 08/11/22 2238

## 2022-08-11 NOTE — ED Triage Notes (Signed)
Pt to ED via EMS from [facility], pt states he was eating tonight and feels like a fry got stuck in his throat. Pt denies choking at facility states he just feels like its stuck in his throat. Pt denies difficulty breathing or swallowing. Pt able to talk in complete sentences.

## 2022-08-11 NOTE — ED Notes (Signed)
Pt given drink at this time, drank with no difficulty.

## 2022-08-11 NOTE — ED Notes (Signed)
Called [facility] for transport to [facility]  

## 2022-08-18 ENCOUNTER — Encounter: Payer: Self-pay | Admitting: Pulmonary Disease

## 2022-08-18 ENCOUNTER — Ambulatory Visit (INDEPENDENT_AMBULATORY_CARE_PROVIDER_SITE_OTHER): Payer: Medicare Other | Admitting: Pulmonary Disease

## 2022-08-18 VITALS — BP 110/70 | HR 85 | Temp 97.8°F | Ht 68.0 in | Wt 220.4 lb

## 2022-08-18 DIAGNOSIS — J479 Bronchiectasis, uncomplicated: Secondary | ICD-10-CM

## 2022-08-18 DIAGNOSIS — J45901 Unspecified asthma with (acute) exacerbation: Secondary | ICD-10-CM | POA: Diagnosis not present

## 2022-08-18 NOTE — Patient Instructions (Signed)
Your lungs sounded very clear today.  Continue using your Trelegy 100, 1 puff daily.  Continue using albuterol and DuoNeb as needed.  We will see him in follow-up in 6 months time call sooner should any problems arise.

## 2022-08-18 NOTE — Progress Notes (Signed)
Subjective:    Patient ID: [name]., male    DOB: 08-Sep-1930, 89 y.o.   MRN: [id number]  Patient Care Team: [facility] as PCP - General (General Practice) Laurey Morale, MD as Consulting Physician (Cardiology) Adam Phenix, DPM as Consulting Physician (Podiatry) Moody Bruins as Physician Assistant (Chiropractic Medicine) Archer Asa, MD as Consulting Physician (Psychiatry) Filbert Berthold, DDS (Dentistry) Janet Berlin, MD as Consulting Physician (Ophthalmology) Aris Lot, MD as Consulting Physician (Dermatology) Jake Bathe, MD as Consulting Physician (Cardiology) Salena Saner, MD as Consulting Physician (Pulmonary Disease)  Chief Complaint  Patient presents with   Follow-up    No SOB. Wheezing. Cough with grey sputum.    HPI [name] is an 89 year old former smoker (40 pack years total) who presents for follow-up on asthmatic bronchitis.  He was last seen here on 02 January 2022.  He was initially seen here because there was concern for a "spot in his lung".  The patient has any lung masses.  He has had issues with asthmatic bronchitis and bronchiectasis.  Since he was started on Trelegy Ellipta 100, 1 inhalation daily he has done much better.  He has had marked decrease in sputum production.  He states that he still requires albuterol rescue perhaps once a week and DuoNeb perhaps once a month.  He has not had any fevers, chills or sweats.  He has had no hemoptysis.  No shortness of breath or wheezing.  Cough has not changed in character occurs usually in the mornings productive of gray sputum.  Overall however the cough is well-controlled.  He does not endorse any other symptomatology today.  He is in good spirits.  The previously apparent lung mass was actually a prominent epicardial fat.  Patient does not have a true lung mass.  Review of Systems A 10 point review of systems was performed and it is as noted above otherwise  negative.   Patient Active Problem List   Diagnosis Date Noted   COPD with acute exacerbation (HCC) 02/18/2022   Influenza A 02/17/2022   Bilateral pneumonia 03/03/2020   Stage 3b chronic kidney disease (CKD) (HCC) 03/02/2020   Acute pyelonephritis 03/02/2020   S/P AKA (above knee amputation), right (HCC) 03/02/2020   Sepsis (HCC) 03/02/2020   Pyelonephritis 03/02/2020   PVD (peripheral vascular disease) (HCC) 03/25/2019   Osteomyelitis of right foot (HCC) 03/25/2019   Frequency of urination and polyuria 03/25/2019   Atherosclerosis of native arteries of the extremities with ulceration (HCC) 03/14/2019   COVID-19 virus infection 03/14/2019   Sacral pressure sore 11/25/2018   Arterial hemorrhage 11/18/2018   Hypoglycemia associated with type 2 diabetes mellitus (HCC) 11/18/2018   General weakness 09/22/2016   Bilateral carpal tunnel syndrome 09/22/2016   Leukocytosis 07/30/2015   Urinary incontinence 12/13/2012   Chronic constipation 11/29/2012   Metabolic syndrome 11/29/2012   Spina bifida (HCC) 11/18/2012   CAD (coronary artery disease) 03/08/2009   Type 2 diabetes mellitus with diabetic nephropathy (HCC) 10/29/2008   Type 2 diabetes mellitus with stage 3 chronic kidney disease, without long-term current use of insulin (HCC) 10/29/2008   PTSD 10/29/2008   Essential hypertension 10/29/2008    Social History   Tobacco Use   Smoking status: Former    Packs/day: 2.00    Years: 20.00    Additional pack years: 0.00    Total pack years: 40.00    Types: Cigarettes    Start date: 11/20/1948    Quit  date: 08/19/1980    Years since quitting: 42.0   Smokeless tobacco: Former    Types: Chew   Tobacco comments:    01/20/2013 "quit chewing 20 yr ago"  Substance Use Topics   Alcohol use: No    Comment: 01/20/2013 "quit drinking 08/19/1980"    Allergies  Allergen Reactions   Sulfonamide Derivatives     REACTION: pruitis patient cant remember its been so long     Aspirin Rash     In high doses   Nitrofurantoin Rash   Penicillins Rash    Did it involve swelling of the face/tongue/throat, SOB, or low BP? No Did it involve sudden or severe rash/hives, skin peeling, or any reaction on the inside of your mouth or nose? No Did you need to seek medical attention at a hospital or doctor's office? No When did it last happen?      Teenager If all above answers are "NO", may proceed with cephalosporin use.   Sulfa Antibiotics Rash    Current Meds  Medication Sig   acetaminophen (TYLENOL) 325 MG tablet Take 650 mg by mouth every 6 (six) hours as needed.   albuterol (VENTOLIN HFA) 108 (90 Base) MCG/ACT inhaler Inhale 2 puffs into the lungs every 6 (six) hours as needed for wheezing or shortness of breath.   ARTIFICIAL TEARS 0.2-0.2-1 % SOLN Apply to eye 3 (three) times daily. Instill 2 drops into each eye 3 times daily.   Ashwagandha 300 MG TABS Take 1 capsule by mouth daily.   aspirin 81 MG tablet Take 81 mg by mouth daily.   buPROPion (WELLBUTRIN XL) 150 MG 24 hr tablet Take 150 mg by mouth daily.   clopidogrel (PLAVIX) 75 MG tablet Take 1 tablet (75 mg total) by mouth daily.   Coenzyme Q10 (CO Q-10) 100 MG CAPS Take 1 capsule by mouth daily.   diclofenac Sodium (VOLTAREN) 1 % GEL Apply topically. APPLY 2 GRAMS TOPICALLY EVERY 6 HOURS AS NEEDED   docusate sodium (COLACE) 100 MG capsule Take 100 mg by mouth at bedtime.   Fluticasone-Umeclidin-Vilant (TRELEGY ELLIPTA) 100-62.5-25 MCG/ACT AEPB Inhale 1 puff into the lungs daily.   GENERLAC 10 GM/15ML SOLN Take 30 mLs by mouth every Monday, Wednesday, and Friday.   ipratropium-albuterol (DUONEB) 0.5-2.5 (3) MG/3ML SOLN 1 NEBULIZER SOLUTION ( ) BY ORAL INHALATION EVERY 4 TO 6 HOURS AS NEEDED   JANUVIA 25 MG tablet Take 25 mg by mouth daily.   lidocaine (LINDAMANTLE) 3 % CREA cream Apply 1 Application topically as needed.   metoprolol tartrate (LOPRESSOR) 25 MG tablet Take 0.5 tablets (12.5 mg total) by mouth 2 (two) times  daily.   nystatin (MYCOSTATIN/NYSTOP) powder Apply topically 2 (two) times daily. Apply to groin and perineum twice a day (Patient taking differently: Apply topically 2 (two) times daily. Apply to groin and perineum as needed)   omeprazole (PRILOSEC) 20 MG capsule Take 20 mg by mouth daily.   polyethylene glycol powder (GLYCOLAX/MIRALAX) 17 GM/SCOOP powder Take 17 g by mouth daily.   pregabalin (LYRICA) 25 MG capsule Take 25 mg by mouth as needed.   pregabalin (LYRICA) 50 MG capsule Take 50 mg by mouth 2 (two) times daily.   rosuvastatin (CRESTOR) 10 MG tablet Take 10 mg by mouth at bedtime.   senna (SENOKOT) 8.6 MG tablet Take 2 tablets by mouth 2 (two) times daily.   senna-docusate (SENOKOT-S) 8.6-50 MG tablet Take 1 tablet by mouth at bedtime as needed for mild constipation.   simethicone (GAS-X) 80  MG chewable tablet Chew 1 tablet (80 mg total) by mouth 4 (four) times daily as needed for flatulence.    Immunization History  Administered Date(s) Administered   Influenza, High Dose Seasonal PF 12/13/2015, 01/23/2017   Influenza,inj,Quad PF,6+ Mos 11/18/2012, 12/23/2013, 01/31/2015, 11/25/2018   Influenza-Unspecified 11/10/2018, 12/13/2019, 02/04/2022   Pneumococcal Conjugate-13 03/08/2013   Pneumococcal Polysaccharide-23 11/16/2014, 11/25/2018   Td 11/26/2010   Tdap 11/26/2010, 08/08/2022   Zoster Recombinat (Shingrix) 08/18/2019, 10/28/2019        Objective:     BP 110/70 (BP Location: Left Arm, Cuff Size: Normal)   Pulse 85   Temp 97.8 F (36.6 C)   Ht 5\' 8"  (1.727 m)   Wt 220 lb 6.4 oz (100 kg) Comment: Per the patient. In a wheelchair  SpO2 100%   BMI 33.51 kg/m   SpO2: 100 % O2 Device: None (Room air)  GENERAL: Obese elderly gentleman, presents in wheelchair. Nonambulatory due to AKA on the right. He is awake, alert, conversant. No conversational dyspnea. HEAD: Normocephalic, atraumatic.  EYES: Pupils equal, round, reactive to light. No scleral icterus.  MOUTH:  Normal wear of teeth, oral mucosa moist. No thrush. NECK: Supple. No thyromegaly. Trachea midline. No JVD. No adenopathy. PULMONARY: Good air entry bilaterally.  Coarse, otherwise no adventitious sounds. CARDIOVASCULAR: S1 and S2. Regular rate and rhythm.  ABDOMEN: Obese, otherwise benign. MUSCULOSKELETAL: AKA amputation, right, no clubbing, no edema. Osteoarthritis changes on the hands. NEUROLOGIC: Grossly nonfocal.  Does have mild memory impairment. SKIN: Intact,warm,dry. PSYCH: Jovial mood, normal behavior.        Assessment & Plan:     ICD-10-CM   1. Asthmatic bronchitis with acute exacerbation, unspecified asthma severity, unspecified whether persistent  J45.901    Well compensated on Trelegy Ellipta Continue Trelegy 100, 1 inhalation daily. Continue as needed albuterol and DuoNeb.    2. Bronchiectasis without complication (HCC)  J47.9    Well compensated     Patient appears to be well compensated.  Continue regimen as above.  We will see him in follow-up in 6 months time call sooner should any new problems arise.   Gailen Shelter, MD Advanced Bronchoscopy PCCM [facility]    *This note was dictated using voice recognition software/Dragon.  Despite best efforts to proofread, errors can occur which can change the meaning. Any transcriptional errors that result from this process are unintentional and may not be fully corrected at the time of dictation.

## 2023-02-06 ENCOUNTER — Emergency Department: Payer: TRICARE For Life (TFL)

## 2023-02-06 ENCOUNTER — Encounter: Payer: Self-pay | Admitting: Emergency Medicine

## 2023-02-06 ENCOUNTER — Other Ambulatory Visit: Payer: Self-pay

## 2023-02-06 DIAGNOSIS — E1122 Type 2 diabetes mellitus with diabetic chronic kidney disease: Secondary | ICD-10-CM | POA: Insufficient documentation

## 2023-02-06 DIAGNOSIS — F17200 Nicotine dependence, unspecified, uncomplicated: Secondary | ICD-10-CM | POA: Insufficient documentation

## 2023-02-06 DIAGNOSIS — N189 Chronic kidney disease, unspecified: Secondary | ICD-10-CM | POA: Insufficient documentation

## 2023-02-06 DIAGNOSIS — R0789 Other chest pain: Secondary | ICD-10-CM | POA: Insufficient documentation

## 2023-02-06 DIAGNOSIS — I2510 Atherosclerotic heart disease of native coronary artery without angina pectoris: Secondary | ICD-10-CM | POA: Diagnosis not present

## 2023-02-06 LAB — CBC
HCT: 34.9 % — ABNORMAL LOW (ref 39.0–52.0)
Hemoglobin: 11.1 g/dL — ABNORMAL LOW (ref 13.0–17.0)
MCH: 30.2 pg (ref 26.0–34.0)
MCHC: 31.8 g/dL (ref 30.0–36.0)
MCV: 95.1 fL (ref 80.0–100.0)
Platelets: 178 10*3/uL (ref 150–400)
RBC: 3.67 MIL/uL — ABNORMAL LOW (ref 4.22–5.81)
RDW: 15.3 % (ref 11.5–15.5)
WBC: 10.3 10*3/uL (ref 4.0–10.5)
nRBC: 0 % (ref 0.0–0.2)

## 2023-02-06 LAB — PROTIME-INR
INR: 1 (ref 0.8–1.2)
Prothrombin Time: 13 s (ref 11.4–15.2)

## 2023-02-06 NOTE — ED Triage Notes (Signed)
First Nurse Note: BIB [facility] from [facility]. Reports L side cp with radiation to L arm and shoulder. Worse with mvmnt and deep inhale. No relief with 2 nitroglycerine. Quadruple CABG in 1989. No diaphoresis or SOB. Pt alert and oriented. Pt allergic to ASA.   EMS VS: 106/82 HR 72 100% RA RR 18

## 2023-02-06 NOTE — ED Triage Notes (Signed)
Pt to ED via EMS from [facility] c/o left shoulder pain radiating to left chest last night.  States came on suddenly yesterday but has had this pain intermittently in the past but never seen for.  Denies SOB, n/v/d.  Pain worsens with movement and coughing.  Pt given nitroglycerin at facility without relief.  Pt A&Ox4, skin WNL and in NAD at this time.

## 2023-02-07 ENCOUNTER — Emergency Department
Admission: EM | Admit: 2023-02-07 | Discharge: 2023-02-07 | Disposition: A | Discharge status: Home / Self Care | Payer: TRICARE For Life (TFL) | Attending: Emergency Medicine | Admitting: Emergency Medicine

## 2023-02-07 DIAGNOSIS — R0789 Other chest pain: Secondary | ICD-10-CM | POA: Diagnosis not present

## 2023-02-07 LAB — BASIC METABOLIC PANEL
Anion gap: 6 (ref 5–15)
BUN: 34 mg/dL — ABNORMAL HIGH (ref 8–23)
CO2: 26 mmol/L (ref 22–32)
Calcium: 8.1 mg/dL — ABNORMAL LOW (ref 8.9–10.3)
Chloride: 106 mmol/L (ref 98–111)
Creatinine, Ser: 2.03 mg/dL — ABNORMAL HIGH (ref 0.61–1.24)
GFR, Estimated: 31 mL/min — ABNORMAL LOW (ref >60)
Glucose, Bld: 163 mg/dL — ABNORMAL HIGH (ref 70–99)
Potassium: 4.3 mmol/L (ref 3.5–5.1)
Sodium: 138 mmol/L (ref 135–145)

## 2023-02-07 LAB — TROPONIN I (HIGH SENSITIVITY)
Troponin I (High Sensitivity): 7 ng/L (ref <18)
Troponin I (High Sensitivity): 7 ng/L (ref <18)

## 2023-02-07 MED ORDER — METHOCARBAMOL 500 MG PO TABS
500.0000 mg | ORAL_TABLET | Freq: Once | ORAL | Status: AC
  Administered 2023-02-07: 500 mg via ORAL
  Filled 2023-02-07: qty 1

## 2023-02-07 MED ORDER — ACETAMINOPHEN 500 MG PO TABS
1000.0000 mg | ORAL_TABLET | Freq: Once | ORAL | Status: AC
  Administered 2023-02-07: 1000 mg via ORAL
  Filled 2023-02-07: qty 2

## 2023-02-07 MED ORDER — LIDOCAINE 5 % EX PTCH
1.0000 | MEDICATED_PATCH | CUTANEOUS | Status: DC
  Administered 2023-02-07: 1 via TRANSDERMAL
  Filled 2023-02-07: qty 1

## 2023-02-07 NOTE — ED Notes (Signed)
Called [facility] spoke with REP: Jade/ Pt placed on list to transport

## 2023-02-07 NOTE — ED Provider Notes (Signed)
[facility] Provider Note    Event Date/Time   First MD Initiated Contact with Patient 02/07/23 [phone]     (approximate)   History   Chest Pain   HPI  [name]. is a 89 y.o. male who presents to the ED for evaluation of Chest Pain   I review a pulmonary clinic visit from June.  Previous 40-pack-year smoking history.  CAD, DM, CKD  Patient presents for about 30-36 hours of left-sided chest and shoulder discomfort.  Atraumatic and no accompanying symptoms such as cough, dyspnea, nausea, emesis.  Pain is worse with palpation to the area, movement and deep breath.   Physical Exam   Triage Vital Signs: ED Triage Vitals  Encounter Vitals Group     BP 02/06/23 2336 123/61     Systolic BP Percentile --      Diastolic BP Percentile --      Pulse Rate 02/06/23 2336 71     Resp 02/06/23 2336 20     Temp 02/06/23 2336 98.1 F (36.7 C)     Temp Source 02/06/23 2336 Oral     SpO2 02/06/23 2336 98 %     Weight 02/06/23 2332 217 lb (98.4 kg)     Height 02/06/23 2332 5\' 6"  (1.676 m)     Head Circumference --      Peak Flow --      Pain Score 02/06/23 2332 4     Pain Loc --      Pain Education --      Exclude from Growth Chart --     Most recent vital signs: Vitals:   02/07/23 0500 02/07/23 0630  BP: 127/65 (!) 141/75  Pulse: 87 65  Resp: 18 17  Temp:    SpO2: 99% 98%    General: Awake, no distress.  Well-appearing, conversational and loquacious CV:  Good peripheral perfusion.  Resp:  Normal effort.  Abd:  No distention.  MSK:  No deformity noted.  Neuro:  No focal deficits appreciated. Other:  No rash or skin changes to the left anterior chest around the site of pain.  He is to palpation the   ED Results / Procedures / Treatments   Labs (all labs ordered are listed, but only abnormal results are displayed) Labs Reviewed  BASIC METABOLIC PANEL - Abnormal; Notable for the following components:      Result Value   Glucose, Bld 163  (*)    BUN 34 (*)    Creatinine, Ser 2.03 (*)    Calcium 8.1 (*)    GFR, Estimated 31 (*)    All other components within normal limits  CBC - Abnormal; Notable for the following components:   RBC 3.67 (*)    Hemoglobin 11.1 (*)    HCT 34.9 (*)    All other components within normal limits  PROTIME-INR  TROPONIN I (HIGH SENSITIVITY)  TROPONIN I (HIGH SENSITIVITY)    EKG Low amplitude.  Sinus rhythm with rate of 78 bpm.  Normal axis.  Mild first-degree block.  No high-grade AV blocks.  No STEMI.  RADIOLOGY CXR interpreted by me without evidence of acute cardiopulmonary pathology.  Official radiology report(s): DG Chest Port 1 View Result Date: 02/07/2023 CLINICAL DATA:  Left-sided chest pain with radiation to left arm EXAM: PORTABLE CHEST 1 VIEW COMPARISON:  02/17/2022 FINDINGS: Stable cardiomediastinal silhouette. Sternotomy and CABG. Low lung volumes accentuate pulmonary vascularity. No focal consolidation, pleural effusion, or pneumothorax. No displaced rib fractures.  IMPRESSION: Low lung volumes without acute cardiopulmonary disease. Electronically Signed   By: Minerva Fester M.D.   On: 02/07/2023 00:19    PROCEDURES and INTERVENTIONS:  .1-3 Lead EKG Interpretation  Performed by: Delton Prairie, MD Authorized by: Delton Prairie, MD     Interpretation: normal     ECG rate:  80   ECG rate assessment: normal     Rhythm: sinus rhythm     Ectopy: none     Conduction: normal     Medications  lidocaine (LIDODERM) 5 % 1 patch (1 patch Transdermal Patch Applied 02/07/23 0456)  acetaminophen (TYLENOL) tablet 1,000 mg (1,000 mg Oral Given 02/07/23 0456)  methocarbamol (ROBAXIN) tablet 500 mg (500 mg Oral Given 02/07/23 0456)     IMPRESSION / MDM / ASSESSMENT AND PLAN / ED COURSE  I reviewed the triage vital signs and the nursing notes.  Differential diagnosis includes, but is not limited to, ACS, PTX, PNA, muscle strain/spasm, PE, dissection, anxiety, pleural  effusion  {Patient presents with symptoms of an acute illness or injury that is potentially life-threatening.  89 year old with remote cardiac history presents with atypical chest discomfort.  EKG is nonischemic and 2 negative troponins.  Clear CXR.  CKD near baseline.  No leukocytosis.  We will treat for MSK pain and reassess  Clinical Course as of 02/07/23 0718  Sat Feb 07, 2023  0605 Reassessed.  Pain-free.  Discussed possible etiologies of symptoms as well as return precautions.  We will discharge back to his facility [DS]    Clinical Course User Index [DS] Delton Prairie, MD     FINAL CLINICAL IMPRESSION(S) / ED DIAGNOSES   Final diagnoses:  Other chest pain  Chest wall pain     Rx / DC Orders   ED Discharge Orders     None        Note:  This document was prepared using Dragon voice recognition software and may include unintentional dictation errors.   Delton Prairie, MD 02/07/23 [phone]

## 2023-02-07 NOTE — ED Notes (Addendum)
This RN at bedside, pt states he is hungry. Diet order placed at this time.

## 2023-02-07 NOTE — Discharge Instructions (Signed)
Use Tylenol for pain and fevers.  Up to 1000 mg per dose, up to 4 times per day.  Do not take more than 4000 mg of Tylenol/acetaminophen within 24 hours..  

## 2023-02-07 NOTE — ED Notes (Signed)
[facility] [employer] was called and report given to [employer], [employer] .

## 2023-02-07 NOTE — ED Notes (Signed)
Report received from Kim,RN.

## 2023-04-28 ENCOUNTER — Emergency Department
Admission: EM | Admit: 2023-04-28 | Discharge: 2023-04-28 | Disposition: A | Discharge status: Skilled Nursing Facility | Attending: Emergency Medicine | Admitting: Emergency Medicine

## 2023-04-28 ENCOUNTER — Emergency Department

## 2023-04-28 ENCOUNTER — Other Ambulatory Visit: Payer: Self-pay

## 2023-04-28 DIAGNOSIS — E1122 Type 2 diabetes mellitus with diabetic chronic kidney disease: Secondary | ICD-10-CM | POA: Diagnosis not present

## 2023-04-28 DIAGNOSIS — I129 Hypertensive chronic kidney disease with stage 1 through stage 4 chronic kidney disease, or unspecified chronic kidney disease: Secondary | ICD-10-CM | POA: Insufficient documentation

## 2023-04-28 DIAGNOSIS — R140 Abdominal distension (gaseous): Secondary | ICD-10-CM | POA: Diagnosis present

## 2023-04-28 DIAGNOSIS — N1832 Chronic kidney disease, stage 3b: Secondary | ICD-10-CM | POA: Diagnosis not present

## 2023-04-28 DIAGNOSIS — E119 Type 2 diabetes mellitus without complications: Secondary | ICD-10-CM

## 2023-04-28 DIAGNOSIS — K9289 Other specified diseases of the digestive system: Secondary | ICD-10-CM

## 2023-04-28 LAB — COMPREHENSIVE METABOLIC PANEL
ALT: 9 U/L (ref 0–44)
AST: 11 U/L — ABNORMAL LOW (ref 15–41)
Albumin: 2.9 g/dL — ABNORMAL LOW (ref 3.5–5.0)
Alkaline Phosphatase: 62 U/L (ref 38–126)
Anion gap: 6 (ref 5–15)
BUN: 32 mg/dL — ABNORMAL HIGH (ref 8–23)
CO2: 24 mmol/L (ref 22–32)
Calcium: 8.3 mg/dL — ABNORMAL LOW (ref 8.9–10.3)
Chloride: 107 mmol/L (ref 98–111)
Creatinine, Ser: 1.76 mg/dL — ABNORMAL HIGH (ref 0.61–1.24)
GFR, Estimated: 36 mL/min — ABNORMAL LOW (ref >60)
Glucose, Bld: 145 mg/dL — ABNORMAL HIGH (ref 70–99)
Potassium: 4.1 mmol/L (ref 3.5–5.1)
Sodium: 137 mmol/L (ref 135–145)
Total Bilirubin: 0.5 mg/dL (ref 0.0–1.2)
Total Protein: 5.6 g/dL — ABNORMAL LOW (ref 6.5–8.1)

## 2023-04-28 LAB — CBC
HCT: 35.4 % — ABNORMAL LOW (ref 39.0–52.0)
Hemoglobin: 11 g/dL — ABNORMAL LOW (ref 13.0–17.0)
MCH: 29.9 pg (ref 26.0–34.0)
MCHC: 31.1 g/dL (ref 30.0–36.0)
MCV: 96.2 fL (ref 80.0–100.0)
Platelets: 175 10*3/uL (ref 150–400)
RBC: 3.68 MIL/uL — ABNORMAL LOW (ref 4.22–5.81)
RDW: 14.8 % (ref 11.5–15.5)
WBC: 8.8 10*3/uL (ref 4.0–10.5)
nRBC: 0 % (ref 0.0–0.2)

## 2023-04-28 LAB — LIPASE, BLOOD: Lipase: 28 U/L (ref 11–51)

## 2023-04-28 MED ORDER — IOHEXOL 300 MG/ML  SOLN
75.0000 mL | Freq: Once | INTRAMUSCULAR | Status: AC | PRN
  Administered 2023-04-28: 75 mL via INTRAVENOUS

## 2023-04-28 MED ORDER — SIMETHICONE 80 MG PO CHEW: 80.0000 mg | CHEWABLE_TABLET | Freq: Four times a day (QID) | ORAL | 0 refills | Status: AC | PRN

## 2023-04-28 NOTE — ED Triage Notes (Signed)
 Pt to ED via EMS from [facility], pt had routine abd XR at facility that showed possible torsion of colon. Pt denies pain, last BM was last night. Pt denies any complaints

## 2023-04-28 NOTE — ED Provider Notes (Signed)
 [facility] Provider Note    Event Date/Time   First MD Initiated Contact with Patient 04/28/23 [phone]     (approximate)   History   Chief Complaint: Abnormal Lab   HPI  [name]. is a 89 y.o. male with a history of hypertension, diabetes, CKD who was sent to the ED due to concern for bowel obstruction.  He had a routine abdominal x-ray performed at his skilled nursing facility which seem to show distended or possibly torsed colon.  Patient denies any pain, reports normal oral intake, no vomiting or diarrhea.          Physical Exam   Triage Vital Signs: ED Triage Vitals  Encounter Vitals Group     BP 04/28/23 0016 (!) 116/51     Systolic BP Percentile --      Diastolic BP Percentile --      Pulse Rate 04/28/23 0016 65     Resp 04/28/23 0016 17     Temp 04/28/23 0016 97.7 F (36.5 C)     Temp Source 04/28/23 0016 Oral     SpO2 04/28/23 0016 100 %     Weight 04/28/23 0016 218 lb (98.9 kg)     Height 04/28/23 0015 5\' 8"  (1.727 m)     Head Circumference --      Peak Flow --      Pain Score 04/28/23 0015 0     Pain Loc --      Pain Education --      Exclude from Growth Chart --     Most recent vital signs: Vitals:   04/28/23 0016 04/28/23 0414  BP: (!) 116/51 (!) 127/53  Pulse: 65 61  Resp: 17 18  Temp: 97.7 F (36.5 C) 97.9 F (36.6 C)  SpO2: 100% 100%    General: Awake, no distress.  CV:  Good peripheral perfusion.  Regular rate rhythm Resp:  Normal effort.  Clear to auscultation bilaterally Abd:  No distention.  Soft nontender.  There is tympany to percussion. Other:  Moist oral mucosa.   ED Results / Procedures / Treatments   Labs (all labs ordered are listed, but only abnormal results are displayed) Labs Reviewed  COMPREHENSIVE METABOLIC PANEL - Abnormal; Notable for the following components:      Result Value   Glucose, Bld 145 (*)    BUN 32 (*)    Creatinine, Ser 1.76 (*)    Calcium 8.3 (*)    Total  Protein 5.6 (*)    Albumin 2.9 (*)    AST 11 (*)    GFR, Estimated 36 (*)    All other components within normal limits  CBC - Abnormal; Notable for the following components:   RBC 3.68 (*)    Hemoglobin 11.0 (*)    HCT 35.4 (*)    All other components within normal limits  LIPASE, BLOOD     EKG    RADIOLOGY CT abdomen pelvis interpreted by me, negative for signs of bowel obstruction or perforation.  Radiology report reviewed   PROCEDURES:  Procedures   MEDICATIONS ORDERED IN ED: Medications  iohexol (OMNIPAQUE) 300 MG/ML solution 75 mL (75 mLs Intravenous Contrast Given 04/28/23 0106)     IMPRESSION / MDM / ASSESSMENT AND PLAN / ED COURSE  I reviewed the triage vital signs and the nursing notes.  DDx: Bowel obstruction, bowel perforation, intestinal gas, constipation, electrolyte derangement  Patient's presentation is most consistent with acute presentation with potential  threat to life or bodily function.  Patient sent to the ED due to concerns for bowel obstruction/volvulus.  He is having no pain, exam is benign, CT unremarkable.  He does have a substantial amount of intestinal gas/bloating, will restart simethicone in addition to his Senokot and MiraLAX.  He is nontoxic and stable for discharge       FINAL CLINICAL IMPRESSION(S) / ED DIAGNOSES   Final diagnoses:  Gas bloat syndrome  Type 2 diabetes mellitus without complication, without long-term current use of insulin (HCC)  Stage 3b chronic kidney disease (HCC)     Rx / DC Orders   ED Discharge Orders          Ordered    simethicone (GAS-X) 80 MG chewable tablet  4 times daily PRN        04/28/23 0325             Note:  This document was prepared using Dragon voice recognition software and may include unintentional dictation errors.   Sharman Cheek, MD 04/28/23 [phone]

## 2023-04-28 NOTE — ED Notes (Signed)
 Called [facility] @0450  to arrange transport back to [facility] spoke to Pam/ Patient on list for pick up after first scheduled transport.

## 2023-05-12 ENCOUNTER — Encounter: Payer: Self-pay | Admitting: Emergency Medicine

## 2023-05-12 ENCOUNTER — Other Ambulatory Visit: Payer: Self-pay

## 2023-05-12 ENCOUNTER — Emergency Department

## 2023-05-12 ENCOUNTER — Inpatient Hospital Stay
Admission: EM | Admit: 2023-05-12 | Discharge: 2023-05-15 | DRG: 394 | Disposition: A | Discharge status: Skilled Nursing Facility | Source: Skilled Nursing Facility | Attending: Internal Medicine | Admitting: Internal Medicine

## 2023-05-12 DIAGNOSIS — N179 Acute kidney failure, unspecified: Secondary | ICD-10-CM | POA: Diagnosis present

## 2023-05-12 DIAGNOSIS — Z66 Do not resuscitate: Secondary | ICD-10-CM | POA: Diagnosis present

## 2023-05-12 DIAGNOSIS — E785 Hyperlipidemia, unspecified: Secondary | ICD-10-CM | POA: Diagnosis present

## 2023-05-12 DIAGNOSIS — L8990 Pressure ulcer of unspecified site, unspecified stage: Secondary | ICD-10-CM | POA: Insufficient documentation

## 2023-05-12 DIAGNOSIS — D631 Anemia in chronic kidney disease: Secondary | ICD-10-CM | POA: Diagnosis present

## 2023-05-12 DIAGNOSIS — Z9842 Cataract extraction status, left eye: Secondary | ICD-10-CM

## 2023-05-12 DIAGNOSIS — I25810 Atherosclerosis of coronary artery bypass graft(s) without angina pectoris: Secondary | ICD-10-CM | POA: Diagnosis present

## 2023-05-12 DIAGNOSIS — Z9841 Cataract extraction status, right eye: Secondary | ICD-10-CM

## 2023-05-12 DIAGNOSIS — R109 Unspecified abdominal pain: Secondary | ICD-10-CM

## 2023-05-12 DIAGNOSIS — Z604 Social exclusion and rejection: Secondary | ICD-10-CM | POA: Diagnosis present

## 2023-05-12 DIAGNOSIS — I2510 Atherosclerotic heart disease of native coronary artery without angina pectoris: Secondary | ICD-10-CM | POA: Diagnosis present

## 2023-05-12 DIAGNOSIS — E669 Obesity, unspecified: Secondary | ICD-10-CM | POA: Insufficient documentation

## 2023-05-12 DIAGNOSIS — Z89611 Acquired absence of right leg above knee: Secondary | ICD-10-CM

## 2023-05-12 DIAGNOSIS — R7989 Other specified abnormal findings of blood chemistry: Secondary | ICD-10-CM | POA: Insufficient documentation

## 2023-05-12 DIAGNOSIS — K603 Anal fistula, unspecified: Secondary | ICD-10-CM | POA: Diagnosis present

## 2023-05-12 DIAGNOSIS — K6289 Other specified diseases of anus and rectum: Principal | ICD-10-CM | POA: Diagnosis present

## 2023-05-12 DIAGNOSIS — K5909 Other constipation: Secondary | ICD-10-CM | POA: Diagnosis present

## 2023-05-12 DIAGNOSIS — J449 Chronic obstructive pulmonary disease, unspecified: Secondary | ICD-10-CM | POA: Diagnosis present

## 2023-05-12 DIAGNOSIS — I130 Hypertensive heart and chronic kidney disease with heart failure and stage 1 through stage 4 chronic kidney disease, or unspecified chronic kidney disease: Secondary | ICD-10-CM | POA: Diagnosis present

## 2023-05-12 DIAGNOSIS — E1121 Type 2 diabetes mellitus with diabetic nephropathy: Secondary | ICD-10-CM | POA: Diagnosis present

## 2023-05-12 DIAGNOSIS — L89312 Pressure ulcer of right buttock, stage 2: Secondary | ICD-10-CM | POA: Diagnosis present

## 2023-05-12 DIAGNOSIS — Z883 Allergy status to other anti-infective agents status: Secondary | ICD-10-CM

## 2023-05-12 DIAGNOSIS — K5904 Chronic idiopathic constipation: Secondary | ICD-10-CM | POA: Diagnosis present

## 2023-05-12 DIAGNOSIS — F4310 Post-traumatic stress disorder, unspecified: Secondary | ICD-10-CM | POA: Diagnosis present

## 2023-05-12 DIAGNOSIS — N1832 Chronic kidney disease, stage 3b: Secondary | ICD-10-CM | POA: Diagnosis present

## 2023-05-12 DIAGNOSIS — Z8249 Family history of ischemic heart disease and other diseases of the circulatory system: Secondary | ICD-10-CM

## 2023-05-12 DIAGNOSIS — Z880 Allergy status to penicillin: Secondary | ICD-10-CM

## 2023-05-12 DIAGNOSIS — I252 Old myocardial infarction: Secondary | ICD-10-CM

## 2023-05-12 DIAGNOSIS — R1084 Generalized abdominal pain: Secondary | ICD-10-CM | POA: Diagnosis not present

## 2023-05-12 DIAGNOSIS — Z794 Long term (current) use of insulin: Secondary | ICD-10-CM

## 2023-05-12 DIAGNOSIS — Z79899 Other long term (current) drug therapy: Secondary | ICD-10-CM

## 2023-05-12 DIAGNOSIS — E1151 Type 2 diabetes mellitus with diabetic peripheral angiopathy without gangrene: Secondary | ICD-10-CM | POA: Diagnosis present

## 2023-05-12 DIAGNOSIS — Z7984 Long term (current) use of oral hypoglycemic drugs: Secondary | ICD-10-CM

## 2023-05-12 DIAGNOSIS — Z961 Presence of intraocular lens: Secondary | ICD-10-CM | POA: Diagnosis present

## 2023-05-12 DIAGNOSIS — E1122 Type 2 diabetes mellitus with diabetic chronic kidney disease: Secondary | ICD-10-CM | POA: Diagnosis present

## 2023-05-12 DIAGNOSIS — Z882 Allergy status to sulfonamides status: Secondary | ICD-10-CM

## 2023-05-12 DIAGNOSIS — Z7902 Long term (current) use of antithrombotics/antiplatelets: Secondary | ICD-10-CM

## 2023-05-12 DIAGNOSIS — I2489 Other forms of acute ischemic heart disease: Secondary | ICD-10-CM | POA: Diagnosis present

## 2023-05-12 DIAGNOSIS — Z6832 Body mass index (BMI) 32.0-32.9, adult: Secondary | ICD-10-CM

## 2023-05-12 DIAGNOSIS — I5032 Chronic diastolic (congestive) heart failure: Secondary | ICD-10-CM | POA: Diagnosis present

## 2023-05-12 DIAGNOSIS — Z886 Allergy status to analgesic agent status: Secondary | ICD-10-CM

## 2023-05-12 DIAGNOSIS — Q059 Spina bifida, unspecified: Secondary | ICD-10-CM

## 2023-05-12 DIAGNOSIS — Z7982 Long term (current) use of aspirin: Secondary | ICD-10-CM

## 2023-05-12 DIAGNOSIS — E66811 Obesity, class 1: Secondary | ICD-10-CM | POA: Diagnosis present

## 2023-05-12 DIAGNOSIS — L89322 Pressure ulcer of left buttock, stage 2: Secondary | ICD-10-CM | POA: Diagnosis present

## 2023-05-12 LAB — COMPREHENSIVE METABOLIC PANEL
ALT: 9 U/L (ref 0–44)
AST: 13 U/L — ABNORMAL LOW (ref 15–41)
Albumin: 3 g/dL — ABNORMAL LOW (ref 3.5–5.0)
Alkaline Phosphatase: 56 U/L (ref 38–126)
Anion gap: 16 — ABNORMAL HIGH (ref 5–15)
BUN: 39 mg/dL — ABNORMAL HIGH (ref 8–23)
CO2: 21 mmol/L — ABNORMAL LOW (ref 22–32)
Calcium: 6.6 mg/dL — ABNORMAL LOW (ref 8.9–10.3)
Chloride: 101 mmol/L (ref 98–111)
Creatinine, Ser: 2.24 mg/dL — ABNORMAL HIGH (ref 0.61–1.24)
GFR, Estimated: 27 mL/min — ABNORMAL LOW (ref >60)
Glucose, Bld: 116 mg/dL — ABNORMAL HIGH (ref 70–99)
Potassium: 4 mmol/L (ref 3.5–5.1)
Sodium: 138 mmol/L (ref 135–145)
Total Bilirubin: 0.6 mg/dL (ref 0.0–1.2)
Total Protein: 5.8 g/dL — ABNORMAL LOW (ref 6.5–8.1)

## 2023-05-12 LAB — CBC
HCT: 37.3 % — ABNORMAL LOW (ref 39.0–52.0)
Hemoglobin: 11.7 g/dL — ABNORMAL LOW (ref 13.0–17.0)
MCH: 29.2 pg (ref 26.0–34.0)
MCHC: 31.4 g/dL (ref 30.0–36.0)
MCV: 93 fL (ref 80.0–100.0)
Platelets: 177 10*3/uL (ref 150–400)
RBC: 4.01 MIL/uL — ABNORMAL LOW (ref 4.22–5.81)
RDW: 15.3 % (ref 11.5–15.5)
WBC: 19 10*3/uL — ABNORMAL HIGH (ref 4.0–10.5)
nRBC: 0 % (ref 0.0–0.2)

## 2023-05-12 LAB — LIPASE, BLOOD: Lipase: 20 U/L (ref 11–51)

## 2023-05-12 MED ORDER — LACTATED RINGERS IV BOLUS (SEPSIS)
1000.0000 mL | Freq: Once | INTRAVENOUS | Status: AC
  Administered 2023-05-12: 1000 mL via INTRAVENOUS

## 2023-05-12 MED ORDER — LACTATED RINGERS IV SOLN: INTRAVENOUS | Status: AC

## 2023-05-12 MED ORDER — PIPERACILLIN-TAZOBACTAM 3.375 G IVPB 30 MIN
3.3750 g | Freq: Once | INTRAVENOUS | Status: AC
  Administered 2023-05-13: 3.375 g via INTRAVENOUS
  Filled 2023-05-12 (×2): qty 50

## 2023-05-12 NOTE — ED Notes (Signed)
 First Nurse Note: Pt to ED via [facility] from [facility]. Pt has been c/o abd pain x 2 days. Pt seen at [facility] this morning. Pt is bed bound at baseline.

## 2023-05-12 NOTE — Sepsis Progress Note (Signed)
 Elink monitoring for the code sepsis protocol.

## 2023-05-12 NOTE — ED Triage Notes (Signed)
 Patient to ED via [facility] from [facility] for abd pain. Started this AM.

## 2023-05-12 NOTE — Progress Notes (Signed)
 CODE SEPSIS - PHARMACY COMMUNICATION  **Broad Spectrum Antibiotics should be administered within 1 hour of Sepsis diagnosis**  Time Code Sepsis Called/Page Received: 2318  Antibiotics Ordered: Zosyn  Time of 1st antibiotic administration: 0116  Otelia Sergeant, PharmD, [facility] 05/12/2023 11:20 PM

## 2023-05-12 NOTE — ED Provider Notes (Signed)
   [facility] Provider Note    Event Date/Time   First MD Initiated Contact with Patient 05/12/23 2202     (approximate)   History   Abdominal Pain   HPI  [name]. is a 89 y.o. male past medical history significant for CAD, diabetes, CKD     Physical Exam   Triage Vital Signs: ED Triage Vitals  Encounter Vitals Group     BP 05/12/23 1512 101/62     Systolic BP Percentile --      Diastolic BP Percentile --      Pulse Rate 05/12/23 1512 87     Resp 05/12/23 1512 18     Temp 05/12/23 1512 98 F (36.7 C)     Temp Source 05/12/23 1512 Oral     SpO2 05/12/23 1512 94 %     Weight 05/12/23 1513 216 lb 0.8 oz (98 kg)     Height 05/12/23 1513 5\' 8"  (1.727 m)     Head Circumference --      Peak Flow --      Pain Score 05/12/23 1513 4     Pain Loc --      Pain Education --      Exclude from Growth Chart --     Most recent vital signs: Vitals:   05/12/23 1512  BP: 101/62  Pulse: 87  Resp: 18  Temp: 98 F (36.7 C)  SpO2: 94%    Physical Exam  IMPRESSION / MDM / ASSESSMENT AND PLAN / ED COURSE  I reviewed the triage vital signs and the nursing notes.  *** EKG  I, Corena Herter, the attending physician, personally viewed and interpreted this ECG.   Rate: Normal  Rhythm: Normal sinus  Axis: Normal  Intervals: Normal  ST&T Change: None  No tachycardic or bradycardic dysrhythmias while on cardiac telemetry.  RADIOLOGY I independently reviewed imaging, my interpretation of imaging: ***  LABS (all labs ordered are listed, but only abnormal results are displayed) Labs interpreted as -    Labs Reviewed  COMPREHENSIVE METABOLIC PANEL - Abnormal; Notable for the following components:      Result Value   CO2 21 (*)    Glucose, Bld 116 (*)    BUN 39 (*)    Creatinine, Ser 2.24 (*)    Calcium 6.6 (*)    Total Protein 5.8 (*)    Albumin 3.0 (*)    AST 13 (*)    GFR, Estimated 27 (*)    Anion gap 16 (*)    All other  components within normal limits  CBC - Abnormal; Notable for the following components:   WBC 19.0 (*)    RBC 4.01 (*)    Hemoglobin 11.7 (*)    HCT 37.3 (*)    All other components within normal limits  LIPASE, BLOOD  URINALYSIS, ROUTINE W REFLEX MICROSCOPIC     MDM       PROCEDURES:  Critical Care performed: No  Procedures  Patient's presentation is most consistent with {EM COPA:27473}   MEDICATIONS ORDERED IN ED: Medications - No data to display  FINAL CLINICAL IMPRESSION(S) / ED DIAGNOSES   Final diagnoses:  None     Rx / DC Orders   ED Discharge Orders     None        Note:  This document was prepared using Dragon voice recognition software and may include unintentional dictation errors.

## 2023-05-13 ENCOUNTER — Inpatient Hospital Stay

## 2023-05-13 ENCOUNTER — Other Ambulatory Visit: Payer: Self-pay

## 2023-05-13 DIAGNOSIS — K5909 Other constipation: Secondary | ICD-10-CM | POA: Diagnosis present

## 2023-05-13 DIAGNOSIS — Z89611 Acquired absence of right leg above knee: Secondary | ICD-10-CM | POA: Diagnosis not present

## 2023-05-13 DIAGNOSIS — K6289 Other specified diseases of anus and rectum: Secondary | ICD-10-CM | POA: Diagnosis present

## 2023-05-13 DIAGNOSIS — Z6832 Body mass index (BMI) 32.0-32.9, adult: Secondary | ICD-10-CM | POA: Diagnosis not present

## 2023-05-13 DIAGNOSIS — F4310 Post-traumatic stress disorder, unspecified: Secondary | ICD-10-CM | POA: Diagnosis present

## 2023-05-13 DIAGNOSIS — N1832 Chronic kidney disease, stage 3b: Secondary | ICD-10-CM | POA: Insufficient documentation

## 2023-05-13 DIAGNOSIS — R1084 Generalized abdominal pain: Secondary | ICD-10-CM

## 2023-05-13 DIAGNOSIS — I5032 Chronic diastolic (congestive) heart failure: Secondary | ICD-10-CM | POA: Diagnosis present

## 2023-05-13 DIAGNOSIS — Z794 Long term (current) use of insulin: Secondary | ICD-10-CM | POA: Diagnosis not present

## 2023-05-13 DIAGNOSIS — I2489 Other forms of acute ischemic heart disease: Secondary | ICD-10-CM | POA: Diagnosis present

## 2023-05-13 DIAGNOSIS — N179 Acute kidney failure, unspecified: Secondary | ICD-10-CM | POA: Insufficient documentation

## 2023-05-13 DIAGNOSIS — I25810 Atherosclerosis of coronary artery bypass graft(s) without angina pectoris: Secondary | ICD-10-CM | POA: Diagnosis present

## 2023-05-13 DIAGNOSIS — I2510 Atherosclerotic heart disease of native coronary artery without angina pectoris: Secondary | ICD-10-CM | POA: Diagnosis present

## 2023-05-13 DIAGNOSIS — Z66 Do not resuscitate: Secondary | ICD-10-CM | POA: Diagnosis present

## 2023-05-13 DIAGNOSIS — L89312 Pressure ulcer of right buttock, stage 2: Secondary | ICD-10-CM | POA: Diagnosis present

## 2023-05-13 DIAGNOSIS — D631 Anemia in chronic kidney disease: Secondary | ICD-10-CM | POA: Diagnosis present

## 2023-05-13 DIAGNOSIS — E1122 Type 2 diabetes mellitus with diabetic chronic kidney disease: Secondary | ICD-10-CM | POA: Diagnosis present

## 2023-05-13 DIAGNOSIS — R109 Unspecified abdominal pain: Secondary | ICD-10-CM

## 2023-05-13 DIAGNOSIS — I130 Hypertensive heart and chronic kidney disease with heart failure and stage 1 through stage 4 chronic kidney disease, or unspecified chronic kidney disease: Secondary | ICD-10-CM | POA: Diagnosis present

## 2023-05-13 DIAGNOSIS — J449 Chronic obstructive pulmonary disease, unspecified: Secondary | ICD-10-CM | POA: Diagnosis present

## 2023-05-13 DIAGNOSIS — Q059 Spina bifida, unspecified: Secondary | ICD-10-CM | POA: Diagnosis not present

## 2023-05-13 DIAGNOSIS — L89322 Pressure ulcer of left buttock, stage 2: Secondary | ICD-10-CM | POA: Diagnosis present

## 2023-05-13 DIAGNOSIS — R7989 Other specified abnormal findings of blood chemistry: Secondary | ICD-10-CM | POA: Diagnosis not present

## 2023-05-13 DIAGNOSIS — K5904 Chronic idiopathic constipation: Secondary | ICD-10-CM | POA: Diagnosis present

## 2023-05-13 DIAGNOSIS — K603 Anal fistula, unspecified: Secondary | ICD-10-CM | POA: Diagnosis present

## 2023-05-13 DIAGNOSIS — E1151 Type 2 diabetes mellitus with diabetic peripheral angiopathy without gangrene: Secondary | ICD-10-CM | POA: Diagnosis present

## 2023-05-13 LAB — URINALYSIS, W/ REFLEX TO CULTURE (INFECTION SUSPECTED)
Bacteria, UA: NONE SEEN
Bilirubin Urine: NEGATIVE
Glucose, UA: NEGATIVE mg/dL
Ketones, ur: NEGATIVE mg/dL
Nitrite: NEGATIVE
Protein, ur: 100 mg/dL — AB
Specific Gravity, Urine: 1.013 (ref 1.005–1.030)
WBC, UA: >50 WBC/hpf (ref 0–5)
pH: 5 (ref 5.0–8.0)

## 2023-05-13 LAB — LACTIC ACID, PLASMA: Lactic Acid, Venous: 1.1 mmol/L (ref 0.5–1.9)

## 2023-05-13 LAB — CBC
HCT: 34.5 % — ABNORMAL LOW (ref 39.0–52.0)
Hemoglobin: 11.1 g/dL — ABNORMAL LOW (ref 13.0–17.0)
MCH: 29.1 pg (ref 26.0–34.0)
MCHC: 32.2 g/dL (ref 30.0–36.0)
MCV: 90.3 fL (ref 80.0–100.0)
Platelets: 172 10*3/uL (ref 150–400)
RBC: 3.82 MIL/uL — ABNORMAL LOW (ref 4.22–5.81)
RDW: 15.5 % (ref 11.5–15.5)
WBC: 17.2 10*3/uL — ABNORMAL HIGH (ref 4.0–10.5)
nRBC: 0 % (ref 0.0–0.2)

## 2023-05-13 LAB — COMPREHENSIVE METABOLIC PANEL
ALT: 10 U/L (ref 0–44)
AST: 13 U/L — ABNORMAL LOW (ref 15–41)
Albumin: 2.8 g/dL — ABNORMAL LOW (ref 3.5–5.0)
Alkaline Phosphatase: 62 U/L (ref 38–126)
Anion gap: 9 (ref 5–15)
BUN: 40 mg/dL — ABNORMAL HIGH (ref 8–23)
CO2: 24 mmol/L (ref 22–32)
Calcium: 6 mg/dL — CL (ref 8.9–10.3)
Chloride: 104 mmol/L (ref 98–111)
Creatinine, Ser: 2.15 mg/dL — ABNORMAL HIGH (ref 0.61–1.24)
GFR, Estimated: 29 mL/min — ABNORMAL LOW (ref >60)
Glucose, Bld: 135 mg/dL — ABNORMAL HIGH (ref 70–99)
Potassium: 3.8 mmol/L (ref 3.5–5.1)
Sodium: 137 mmol/L (ref 135–145)
Total Bilirubin: 1 mg/dL (ref 0.0–1.2)
Total Protein: 5.6 g/dL — ABNORMAL LOW (ref 6.5–8.1)

## 2023-05-13 LAB — TROPONIN I (HIGH SENSITIVITY)
Troponin I (High Sensitivity): 443 ng/L (ref <18)
Troponin I (High Sensitivity): 455 ng/L (ref <18)
Troponin I (High Sensitivity): 468 ng/L (ref <18)
Troponin I (High Sensitivity): 494 ng/L (ref <18)

## 2023-05-13 LAB — CBG MONITORING, ED
Glucose-Capillary: 127 mg/dL — ABNORMAL HIGH (ref 70–99)
Glucose-Capillary: 137 mg/dL — ABNORMAL HIGH (ref 70–99)
Glucose-Capillary: 115 mg/dL — ABNORMAL HIGH (ref 70–99)
Glucose-Capillary: 177 mg/dL — ABNORMAL HIGH (ref 70–99)
Glucose-Capillary: 158 mg/dL — ABNORMAL HIGH (ref 70–99)
Glucose-Capillary: 118 mg/dL — ABNORMAL HIGH (ref 70–99)

## 2023-05-13 LAB — PROTIME-INR
INR: 1.3 — ABNORMAL HIGH (ref 0.8–1.2)
Prothrombin Time: 16.7 s — ABNORMAL HIGH (ref 11.4–15.2)

## 2023-05-13 LAB — HEMOGLOBIN A1C
Hgb A1c MFr Bld: 7.6 % — ABNORMAL HIGH (ref 4.8–5.6)
Mean Plasma Glucose: 171.42 mg/dL

## 2023-05-13 LAB — APTT: aPTT: 33 s (ref 24–36)

## 2023-05-13 LAB — MAGNESIUM: Magnesium: 2.2 mg/dL (ref 1.7–2.4)

## 2023-05-13 MED ORDER — ROSUVASTATIN CALCIUM 10 MG PO TABS
10.0000 mg | ORAL_TABLET | Freq: Every day | ORAL | Status: DC
  Administered 2023-05-13 – 2023-05-14 (×2): 10 mg via ORAL
  Filled 2023-05-13 (×2): qty 1

## 2023-05-13 MED ORDER — UMECLIDINIUM BROMIDE 62.5 MCG/ACT IN AEPB
1.0000 | INHALATION_SPRAY | Freq: Every day | RESPIRATORY_TRACT | Status: DC
  Administered 2023-05-13 – 2023-05-15 (×3): 1 via RESPIRATORY_TRACT
  Filled 2023-05-13 (×2): qty 7

## 2023-05-13 MED ORDER — HYDROCODONE-ACETAMINOPHEN 5-325 MG PO TABS: 1.0000 | ORAL_TABLET | ORAL | Status: DC | PRN

## 2023-05-13 MED ORDER — HEPARIN (PORCINE) 25000 UT/250ML-% IV SOLN
1300.0000 [IU]/h | INTRAVENOUS | Status: DC
  Administered 2023-05-13: 1000 [IU]/h via INTRAVENOUS
  Filled 2023-05-13: qty 250

## 2023-05-13 MED ORDER — METOPROLOL TARTRATE 25 MG PO TABS
12.5000 mg | ORAL_TABLET | Freq: Two times a day (BID) | ORAL | Status: DC
  Administered 2023-05-13 – 2023-05-15 (×4): 12.5 mg via ORAL
  Filled 2023-05-13 (×5): qty 1

## 2023-05-13 MED ORDER — ASPIRIN 81 MG PO TBEC
81.0000 mg | DELAYED_RELEASE_TABLET | Freq: Every day | ORAL | Status: DC
  Administered 2023-05-13 – 2023-05-15 (×3): 81 mg via ORAL
  Filled 2023-05-13 (×3): qty 1

## 2023-05-13 MED ORDER — ONDANSETRON HCL 4 MG/2ML IJ SOLN: 4.0000 mg | Freq: Four times a day (QID) | INTRAMUSCULAR | Status: DC | PRN

## 2023-05-13 MED ORDER — HEPARIN BOLUS VIA INFUSION
4000.0000 [IU] | Freq: Once | INTRAVENOUS | Status: AC
  Administered 2023-05-13: 4000 [IU] via INTRAVENOUS
  Filled 2023-05-13: qty 4000

## 2023-05-13 MED ORDER — INSULIN ASPART 100 UNIT/ML IJ SOLN
0.0000 [IU] | INTRAMUSCULAR | Status: DC
  Administered 2023-05-13: 2 [IU] via SUBCUTANEOUS
  Administered 2023-05-13: 3 [IU] via SUBCUTANEOUS
  Administered 2023-05-13: 2 [IU] via SUBCUTANEOUS
  Administered 2023-05-13 – 2023-05-14 (×3): 3 [IU] via SUBCUTANEOUS
  Administered 2023-05-15 (×2): 2 [IU] via SUBCUTANEOUS
  Administered 2023-05-15: 3 [IU] via SUBCUTANEOUS
  Filled 2023-05-13 (×9): qty 1

## 2023-05-13 MED ORDER — ONDANSETRON HCL 4 MG PO TABS: 4.0000 mg | ORAL_TABLET | Freq: Four times a day (QID) | ORAL | Status: DC | PRN

## 2023-05-13 MED ORDER — SODIUM CHLORIDE 0.9 % IV SOLN: INTRAVENOUS | Status: AC

## 2023-05-13 MED ORDER — ACETAMINOPHEN 650 MG RE SUPP
650.0000 mg | Freq: Four times a day (QID) | RECTAL | Status: DC | PRN
Start: 2023-05-13 — End: 2023-05-16

## 2023-05-13 MED ORDER — PIPERACILLIN-TAZOBACTAM 3.375 G IVPB
3.3750 g | Freq: Three times a day (TID) | INTRAVENOUS | Status: DC
  Administered 2023-05-13 – 2023-05-15 (×7): 3.375 g via INTRAVENOUS
  Filled 2023-05-13 (×7): qty 50

## 2023-05-13 MED ORDER — FLUTICASONE FUROATE-VILANTEROL 100-25 MCG/ACT IN AEPB
1.0000 | INHALATION_SPRAY | Freq: Every day | RESPIRATORY_TRACT | Status: DC
  Administered 2023-05-13 – 2023-05-15 (×3): 1 via RESPIRATORY_TRACT
  Filled 2023-05-13 (×2): qty 28

## 2023-05-13 MED ORDER — LACTULOSE 10 GM/15ML PO SOLN
20.0000 g | ORAL | Status: DC
  Administered 2023-05-13 – 2023-05-15 (×2): 20 g via ORAL
  Filled 2023-05-13 (×3): qty 30

## 2023-05-13 MED ORDER — MORPHINE SULFATE (PF) 2 MG/ML IV SOLN: 2.0000 mg | INTRAVENOUS | Status: DC | PRN

## 2023-05-13 MED ORDER — ALBUTEROL SULFATE (2.5 MG/3ML) 0.083% IN NEBU
2.5000 mg | INHALATION_SOLUTION | RESPIRATORY_TRACT | Status: DC | PRN
  Administered 2023-05-14: 2.5 mg via RESPIRATORY_TRACT
  Filled 2023-05-13: qty 3

## 2023-05-13 MED ORDER — SENNOSIDES-DOCUSATE SODIUM 8.6-50 MG PO TABS: 1.0000 | ORAL_TABLET | Freq: Every evening | ORAL | Status: DC | PRN

## 2023-05-13 MED ORDER — PANTOPRAZOLE SODIUM 40 MG PO TBEC
40.0000 mg | DELAYED_RELEASE_TABLET | Freq: Every day | ORAL | Status: DC
  Administered 2023-05-13 – 2023-05-15 (×3): 40 mg via ORAL
  Filled 2023-05-13 (×3): qty 1

## 2023-05-13 MED ORDER — ACETAMINOPHEN 325 MG PO TABS: 650.0000 mg | ORAL_TABLET | Freq: Four times a day (QID) | ORAL | Status: DC | PRN

## 2023-05-13 NOTE — Assessment & Plan Note (Addendum)
 Elevated troponin Troponin elevation 494-->443 suspect secondary to demand ischemia as patient has no chest pain, EKG non acute  Continue aspirin, rosuvastatin, ezetimibe and metoprolol Echocardiogram to evaluate for WMA

## 2023-05-13 NOTE — Consult Note (Signed)
 SURGICAL CONSULTATION NOTE   HISTORY OF PRESENT ILLNESS (HPI):  89 y.o. male presented to [facility] ED for evaluation of abdominal pain. Patient reports he started having abdominal pain yesterday.  He described the pain is generalized.  No specific location.  No pain radiation.  Patient cannot identify any alleviating or aggravating factors.  He went to another institution and he had an abdominal x-ray that was concerning for bowel obstruction.  He was sent to the ED for further evaluation.  He denies any nausea or vomiting.  He cannot recall last bowel movement due to decrease sensation.  Patient has history of chronic aspiration.  Patient has past medical history of spina bifida, urinary incontinence, chronic occupation, coronary artery disease, carotid artery disease, hypertension, chronic kidney disease, among others multiple medical comorbidities  At the ED he was found with leukocytosis of 19,000.  No significant abdominal tenderness.  No abdominal distention.  He had a CT scan of the abdomen and pelvis that shows fat stranding surrounding the rectum consistent with proctitis.  There was also a tract from the rectum to the skin consistent with perianal fistula.  I personally evaluated the images.  These findings are new compared to CT scan done 2 weeks ago.  Surgery is consulted by Dr. Para March in this context for evaluation and management of proctitis with perianal fistula.  PAST MEDICAL HISTORY (PMH):  Past Medical History:  Diagnosis Date   Agent orange exposure 1966 or 1971   Anemia    Anxiety    Arthritis    "hands and back" (01/20/2013)   CAD (coronary artery disease)    native vessel   Carotid artery disease (HCC)    nonobstructive   Cataract    Cecal diverticulitis 2008   drained   Cellulitis, gluteal    bilateral for the past 6 months/notes 01/20/2013   Chronic lower back pain    Chronic renal insufficiency    Depression    Diabetes mellitus type II    Exertional shortness  of breath    HTN (hypertension)    Hyperlipidemia    Hypokalemia    Malnutrition (HCC)    protein-calorie   Myocardial infarction (HCC)    "silent; before OHS" (01/20/2013)   PTSD (post-traumatic stress disorder)    Spina bifida (HCC)    Urinary incontinence      PAST SURGICAL HISTORY (PSH):  Past Surgical History:  Procedure Laterality Date   APPLICATION OF WOUND VAC Right 04/08/2019   Procedure: APPLICATION OF WOUND VAC;  Surgeon: Felecia Shelling, DPM;  Location: [facility];  Service: Podiatry;  Laterality: Right;   BONE BIOPSY Right 04/08/2019   Procedure: BONE BIOPSY;  Surgeon: Felecia Shelling, DPM;  Location: [facility];  Service: Podiatry;  Laterality: Right;   CARDIAC CATHETERIZATION  1998   "couple before my OHS" (01/20/2013)   CARPAL TUNNEL RELEASE Right 1980's   CATARACT EXTRACTION W/ INTRAOCULAR LENS  IMPLANT, BILATERAL Bilateral ~ 2010   CORONARY ARTERY BYPASS GRAFT  1998   "CABG X4" (01/20/2013)   CYSTECTOMY  2000's   "cytal cyst on my intestines; probed then drained it; hospitalized for 13 days; NPO" (01/20/2013)   EYE SURGERY     HEMORRHOID BANDING  ~ 10/2012   IR FLUORO GUIDE CV LINE RIGHT  04/01/2019   IR US GUIDE VASC ACCESS RIGHT  04/01/2019   KNEE SURGERY Left 1964   "exploratory; sewed it up w/wire" (01/20/2013)   LOWER EXTREMITY ANGIOGRAPHY Right 03/22/2019  Procedure: LOWER EXTREMITY ANGIOGRAPHY;  Surgeon: Renford Dills, MD;  Location: [facility];  Service: Cardiovascular;  Laterality: Right;   LOWER EXTREMITY ANGIOGRAPHY Left 03/29/2019   Procedure: LOWER EXTREMITY ANGIOGRAPHY;  Surgeon: Renford Dills, MD;  Location: [facility];  Service: Cardiovascular;  Laterality: Left;   PROSTATE SURGERY     SPINE SURGERY  11/24/1932   "Spina bifida surgery"   STRABISMUS SURGERY Bilateral 03/29/2015   Procedure: REPAIR STRABISMUS BILATERAL;  Surgeon: French Ana, MD;  Location: [facility];  Service:  Ophthalmology;  Laterality: Bilateral;   WOUND DEBRIDEMENT Right 04/08/2019   Procedure: DEBRIDEMENT WOUND;  Surgeon: Felecia Shelling, DPM;  Location: [facility];  Service: Podiatry;  Laterality: Right;     MEDICATIONS:  Prior to Admission medications   Medication Sig Start Date End Date Taking? Authorizing Provider  acetaminophen (TYLENOL) 325 MG tablet Take 650 mg by mouth every 6 (six) hours as needed. 01/21/22  Yes [provider]  albuterol (VENTOLIN HFA) 108 (90 Base) MCG/ACT inhaler Inhale 2 puffs into the lungs every 6 (six) hours as needed for wheezing or shortness of breath. 10/01/21  Yes Salena Saner, MD  ARTIFICIAL TEARS 0.2-0.2-1 % SOLN Apply to eye 3 (three) times daily. Instill 2 drops into each eye 3 times daily. 02/20/20  Yes [provider]  Ashwagandha 300 MG TABS Take 1 capsule by mouth at bedtime. Take 460 mg by mouth at bedtime. 04/29/22  Yes [provider]  aspirin 81 MG tablet Take 81 mg by mouth daily.   Yes [provider]  buPROPion (WELLBUTRIN XL) 150 MG 24 hr tablet Take 150 mg by mouth daily.   Yes [provider]  clopidogrel (PLAVIX) 75 MG tablet Take 1 tablet (75 mg total) by mouth daily. 03/23/19  Yes Schnier, Latina Craver, MD  Coenzyme Q10 (CO Q-10) 100 MG CAPS Take 1 capsule by mouth daily. 01/29/22  Yes [provider]  diclofenac Sodium (VOLTAREN) 1 % GEL Apply topically. APPLY 2 GRAMS TOPICALLY TO EACH SHOULDER EVERY 6 HOURS AS NEEDED 08/08/22  Yes [provider]  docusate sodium (COLACE) 100 MG capsule Take 100 mg by mouth 2 (two) times daily.   Yes [provider]  famotidine (PEPCID) 10 MG tablet Take 10 mg by mouth daily.   Yes [provider]  faricimab-svoa (VABYSMO) 6 MG/0.05ML SOSY intravitreal injection 6 mg by Intravitreal route 3 (three) times daily. Administered by the [facility] 3 times a day on Friday every 5 weeks.   Yes [provider]   fluticasone (FLONASE) 50 MCG/ACT nasal spray Place 2 sprays into both nostrils daily. 02/16/23  Yes [provider]  Fluticasone-Umeclidin-Vilant (TRELEGY ELLIPTA) 100-62.5-25 MCG/ACT AEPB Inhale 1 puff into the lungs daily. 10/01/21  Yes Salena Saner, MD  guaiFENesin (MUCINEX) 600 MG 12 hr tablet Take 600 mg by mouth every 12 (twelve) hours as needed. 12/19/22  Yes [provider]  Insulin Glargine (BASAGLAR KWIKPEN) 100 UNIT/ML Inject 10 Units into the skin at bedtime.   Yes [provider]  insulin lispro (HUMALOG) 100 UNIT/ML KwikPen Inject subcutaneously as directed per sliding scale. 01/31/23  Yes [provider]  ipratropium-albuterol (DUONEB) 0.5-2.5 (3) MG/3ML SOLN 1 NEBULIZER SOLUTION ( ) BY ORAL INHALATION EVERY 4 TO 6 HOURS AS NEEDED 12/13/21  Yes [provider]  lactulose (CHRONULAC) 10 GM/15ML solution Take 20 g by mouth 2 (two) times daily. 12/12/22  Yes [provider]  lidocaine (LINDAMANTLE) 3 % CREA cream Apply 1 Application topically as needed. Apply to left shoulder every 6 hours as needed along with diclofenac gel. 05/20/22  Yes [provider]  lidocaine 4 % Place 1 patch onto the skin daily. Apply topically to left chest. Remove at bedtime.   Yes [provider]  metoprolol tartrate (LOPRESSOR) 25 MG tablet Take 0.5 tablets (12.5 mg total) by mouth 2 (two) times daily. Patient taking differently: Take 12.5 mg by mouth daily. 12/15/18  Yes Dettinger, Elige Radon, MD  Multiple Vitamins-Minerals (PRESERVISION AREDS 2 PO) Take 1 capsule by mouth 2 (two) times daily.   Yes [provider]  nystatin (MYCOSTATIN/NYSTOP) powder Apply topically 2 (two) times daily. Apply to groin and perineum twice a day Patient taking differently: Apply topically 3 (three) times daily. Apply to groin and perineum every shift 03/06/20  Yes Lurene Shadow, MD  polyethylene glycol powder (GLYCOLAX/MIRALAX) 17 GM/SCOOP  powder Take 17 g by mouth daily. 07/26/21  Yes Minna Antis, MD  pregabalin (LYRICA) 25 MG capsule Take 25 mg by mouth daily as needed.   Yes [provider]  pregabalin (LYRICA) 50 MG capsule Take 50 mg by mouth 2 (two) times daily.   Yes [provider]  rosuvastatin (CRESTOR) 10 MG tablet Take 10 mg by mouth at bedtime. 08/13/22  Yes [provider]  senna (SENOKOT) 8.6 MG tablet Take 2 tablets by mouth 2 (two) times daily.   Yes [provider]  senna-docusate (SENOKOT-S) 8.6-50 MG tablet Take 1 tablet by mouth at bedtime as needed for mild constipation. 02/19/22  Yes Roberto Scales D, MD  simethicone (GAS-X) 80 MG chewable tablet Chew 1 tablet (80 mg total) by mouth 4 (four) times daily as needed for flatulence. 04/28/23 04/27/24 Yes Sharman Cheek, MD  Sodium Phosphates (FLEET SALINE ENEMA) 7-19 GM/197ML ENEM Place rectally once.   Yes [provider]  12 HOUR NASAL DECONGESTANT 0.05 % nasal spray Place 2 sprays into both nostrils 2 (two) times daily. For 3 days Patient not taking: Reported on 08/18/2022 02/29/20   [provider]  Cadexomer Iodine (IODOFLEX) 0.9 % PADS Apply 1 application topically See admin instructions. Twice a week heel Patient not taking: Reported on 02/17/2022    [provider]  Dextromethorphan-guaiFENesin (MUCINEX DM) 30-600 MG TB12 Take 1 tablet by mouth 2 (two) times daily. For 5 days Patient not taking: Reported on 08/18/2022 02/29/20   [provider]  ezetimibe (ZETIA) 10 MG tablet Take 10 mg by mouth at bedtime. Patient not taking: Reported on 08/18/2022    [provider]  [facility] 10 GM/15ML SOLN Take 30 mLs by mouth every Monday, Wednesday, and Friday. 01/10/20   [provider]  JANUVIA 25 MG tablet Take 25 mg by mouth daily. 08/13/22   [provider]  melatonin 5 MG TABS Take 5 mg by mouth at bedtime. Patient not taking: Reported on 08/18/2022    [provider]  omeprazole (PRILOSEC) 20 MG capsule Take 20 mg by mouth daily. Patient not taking: Reported on 05/13/2023 12/22/19   [provider]     ALLERGIES:  Allergies  Allergen Reactions   Sulfonamide Derivatives     REACTION: pruitis patient cant remember its been so long     Aspirin Rash    In high doses   Nitrofurantoin Rash   Penicillins Rash    Did it involve swelling of the face/tongue/throat, SOB, or low BP? No Did it involve sudden  or severe rash/hives, skin peeling, or any reaction on the inside of your mouth or nose? No Did you need to seek medical attention at a hospital or doctor's office? No When did it last happen?      Teenager If all above answers are "NO", may proceed with cephalosporin use.   Sulfa Antibiotics Rash     SOCIAL HISTORY:  Social History   Socioeconomic History   Marital status: Widowed    Spouse name: Melton Krebs   Number of children: 4   Years of education: Not on file   Highest education level: Not on file  Occupational History   Occupation: Retired    Comment: [employer]   Tobacco Use   Smoking status: Former    Current packs/day: 0.00    Average packs/day: 2.0 packs/day for 31.7 years (63.5 ttl pk-yrs)    Types: Cigarettes    Start date: 11/20/1948    Quit date: 08/19/1980    Years since quitting: 42.7   Smokeless tobacco: Former    Types: Chew   Tobacco comments:    01/20/2013 "quit chewing 20 yr ago"  Vaping Use   Vaping status: Never Used  Substance and Sexual Activity   Alcohol use: No    Comment: 01/20/2013 "quit drinking 08/19/1980"   Drug use: No   Sexual activity: Not Currently  Other Topics Concern   Not on file  Social History Narrative   Not on file   Social Drivers of Health   Financial Resource Strain: Not on file  Food Insecurity: No Food Insecurity (02/18/2022)   Hunger Vital Sign    Worried About Running Out of Food in the Last Year: Never true    Ran Out of Food in the Last Year: Never true   Transportation Needs: No Transportation Needs (02/18/2022)   PRAPARE - Administrator, Civil Service (Medical): No    Lack of Transportation (Non-Medical): No  Physical Activity: Not on file  Stress: Not on file  Social Connections: Not on file  Intimate Partner Violence: Not At Risk (02/18/2022)   Humiliation, Afraid, Rape, and Kick questionnaire    Fear of Current or Ex-Partner: No    Emotionally Abused: No    Physically Abused: No    Sexually Abused: No      FAMILY HISTORY:  Family History  Problem Relation Age of Onset   Heart disease Mother    Heart attack Mother    Heart disease Father    Heart attack Father    Cancer Sister        breast   Cancer Sister        lung and ovarian     REVIEW OF SYSTEMS:  Constitutional: denies weight loss, fever, chills, or sweats  Eyes: denies any other vision changes, history of eye injury  ENT: denies sore throat, hearing problems  Respiratory: denies shortness of breath, wheezing  Cardiovascular: denies chest pain, palpitations  Gastrointestinal: Positive abdominal pain.  Negative nausea and vomiting Genitourinary: Positive urinary frequency Musculoskeletal: Positive joint pains or cramps  Skin: denies any other rashes or skin discolorations  Neurological: denies any other headache, dizziness, weakness  Psychiatric: denies any other depression, anxiety   All other review of systems were negative   VITAL SIGNS:  Temp:  [98 F (36.7 C)-98.4 F (36.9 C)] 98.4 F (36.9 C) (03/19 0429) Pulse Rate:  [84-101] 84 (03/19 0430) Resp:  [18-32] 31 (03/19 0430) BP: (101-130)/(39-65) 128/60 (03/19 0430) SpO2:  [90 %-98 %] 97 % (  03/19 0430) Weight:  [98 kg] 98 kg (03/18 1513)     Height: 5\' 8"  (172.7 cm) Weight: 98 kg BMI (Calculated): 32.86   INTAKE/OUTPUT:  This shift: No intake/output data recorded.  Last 2 shifts: @IOLAST2SHIFTS @   PHYSICAL EXAM:  Constitutional:  -- Normal body habitus  -- Awake, alert, and  oriented x3  Eyes:  -- Pupils equally round and reactive to light  -- No scleral icterus  Ear, nose, and throat:  -- No jugular venous distension  Pulmonary:  -- No crackles  -- Equal breath sounds bilaterally -- Breathing non-labored at rest Cardiovascular:  -- S1, S2 present  -- No pericardial rubs Gastrointestinal:  -- Abdomen soft, nontender, non-distended, no guarding or rebound tenderness -- Rectal exam shows chronic perianal ulcers with a very high opening of the skin.  Decreased sphincter tone Musculoskeletal and Integumentary:  -- Wounds: None appreciated -- Extremities: Right AKA left lower extremity edema Neurologic:  -- Motor function: Decreased -- Sensation: Decreased   Labs:     Latest Ref Rng & Units 05/13/2023    6:17 AM 05/12/2023    3:41 PM 04/28/2023   12:18 AM  CBC  WBC 4.0 - 10.5 K/uL 17.2  19.0  8.8   Hemoglobin 13.0 - 17.0 g/dL 62.9  52.8  41.3   Hematocrit 39.0 - 52.0 % 34.5  37.3  35.4   Platelets 150 - 400 K/uL 172  177  175       Latest Ref Rng & Units 05/13/2023    1:51 AM 05/12/2023    3:41 PM 04/28/2023   12:18 AM  CMP  Glucose 70 - 99 mg/dL 244  010  272   BUN 8 - 23 mg/dL 40  39  32   Creatinine 0.61 - 1.24 mg/dL 5.36  6.44  0.34   Sodium 135 - 145 mmol/L 137  138  137   Potassium 3.5 - 5.1 mmol/L 3.8  4.0  4.1   Chloride 98 - 111 mmol/L 104  101  107   CO2 22 - 32 mmol/L 24  21  24    Calcium 8.9 - 10.3 mg/dL 6.0  6.6  8.3   Total Protein 6.5 - 8.1 g/dL 5.6  5.8  5.6   Total Bilirubin 0.0 - 1.2 mg/dL 1.0  0.6  0.5   Alkaline Phos 38 - 126 U/L 62  56  62   AST 15 - 41 U/L 13  13  11    ALT 0 - 44 U/L 10  9  9      Imaging studies:  EXAM: CT ABDOMEN AND PELVIS WITHOUT CONTRAST   TECHNIQUE: Multidetector CT imaging of the abdomen and pelvis was performed following the standard protocol without IV contrast.   RADIATION DOSE REDUCTION: This exam was performed according to the departmental dose-optimization program which includes  automated exposure control, adjustment of the mA and/or kV according to patient size and/or use of iterative reconstruction technique.   COMPARISON:  CT abdomen pelvis 04/28/2023   FINDINGS: Lower chest: Bibasilar atelectasis/scarring.  No acute abnormality.   Hepatobiliary: No acute abnormality.   Pancreas: Fatty atrophy.  No acute abnormality.   Spleen: Unremarkable.   Adrenals/Urinary Tract: Normal adrenal glands. No urinary calculi or hydronephrosis. Unchanged nonspecific perinephric stranding. Thickening of the nondistended bladder.   Stomach/Bowel: Stomach is within normal limits. No bowel obstruction. Moderate wall thickening about the rectum. Marked adjacent stranding and trace free fluid. There is gas within the left rectal/anal wall extending through  the subcutaneous fat along the left gluteal cleft compatible with anocutaneous fistula.   Vascular/Lymphatic: Aortic atherosclerosis. No enlarged abdominal or pelvic lymph nodes.   Reproductive: Prostatectomy.   Other: No free intraperitoneal air.  No abscess.   Musculoskeletal: No acute fracture.   IMPRESSION: 1. Proctitis with left-sided anocutaneous fistula. No abscess. 2. Thickening of the nondistended bladder. Correlate with urinalysis to exclude cystitis. 3. Aortic Atherosclerosis (ICD10-I70.0).     Electronically Signed   By: Minerva Fester M.D.   On: 05/12/2023 19:59  Assessment/Plan:  89 y.o. male with proctitis with perianal fistula, complicated by pertinent comorbidities including spina bifida, coronary artery disease, hypertension, chronic kidney disease, carotid artery disease.  Proctitis with perineal fistula -This is most likely due to chronic constipation -Recommend to treat constipation aggressive bowel regimen versus enemas to relieve the pressure from the stool burden in the rectum -New finding of perianal fistula most likely due to the inflammation around the rectum and constipation.  No  abscess on CT scan.  Physical exam not concerning for active infection.  This she will respond to antibiotic therapy as well -No surgical intervention will be needed at this moment. -Continue medical management of proctitis -Surgery will continue to follow  Gae Gallop, MD

## 2023-05-13 NOTE — Assessment & Plan Note (Signed)
Not acutely exacerbated Continue home inhalers.   

## 2023-05-13 NOTE — TOC CM/SW Note (Signed)
 CSW visited patient room to complete readmission prevention screening.  Patient was sleeping.  Will follow-up 3/20.

## 2023-05-13 NOTE — Consult Note (Signed)
 Cardiology Consultation   Patient ID: [name]. MRN: [id number]; DOB: 1930/09/09  Admit date: 05/12/2023 Date of Consult: 05/13/2023  PCP:  [facility]   [facility] Providers Cardiologist: seen by [facility] in 2019   Patient Profile:   Bobby Ferrentino. is a 89 y.o. male with a hx of COPD, CAD s/p CABG, chronic diastolic heart failure, CKD stage 3, PAD s/p AKA, DM2 who is being seen 05/13/2023 for the evaluation of elevated troponin at the request of Dr. Clide Dales.  History of Present Illness:   [name] has a h/o CAD s/p remote CABG. Myoview in 2007 showed EF63% ad no ischemia. Echo in 2015 showed LVEF 50-55%, G1DD, mildly dilated aortic root, left atrium mildly dilated, mildly reduced RVSF.  He was last see in 2019 by Dr. Anne Fu and was stable from a cardiac perspective. He was lost to follow-up.   The patient presented to the ER 05/12/23 with abdominal pain. He denies chest pain. Says he was under stress due to losing a good friend. X ray at his facility was concerning for bowel obstruction. Denied diarrhea, constipation or dysuria.   In the ER BP 100/62, mild tachycardia 101, RR 18, afebrile, 94% O2. Labs showed CO2 21, Scr 2.24, BUN 39, albumin 3, AST 13, Hgb 11.7, WBC 19. CT abdomen and pelvis was concerning for proctatitis with a left sided anocutaneous fistula and possible cystitis. He was given IVF and started on IV abx.   Past Medical History:  Diagnosis Date   Agent orange exposure 1966 or 1971   Anemia    Anxiety    Arthritis    "hands and back" (01/20/2013)   CAD (coronary artery disease)    native vessel   Carotid artery disease (HCC)    nonobstructive   Cataract    Cecal diverticulitis 2008   drained   Cellulitis, gluteal    bilateral for the past 6 months/notes 01/20/2013   Chronic lower back pain    Chronic renal insufficiency    Depression    Diabetes mellitus type II    Exertional shortness of breath    HTN  (hypertension)    Hyperlipidemia    Hypokalemia    Malnutrition (HCC)    protein-calorie   Myocardial infarction (HCC)    "silent; before OHS" (01/20/2013)   PTSD (post-traumatic stress disorder)    Spina bifida (HCC)    Urinary incontinence     Past Surgical History:  Procedure Laterality Date   APPLICATION OF WOUND VAC Right 04/08/2019   Procedure: APPLICATION OF WOUND VAC;  Surgeon: Felecia Shelling, DPM;  Location: [facility];  Service: Podiatry;  Laterality: Right;   BONE BIOPSY Right 04/08/2019   Procedure: BONE BIOPSY;  Surgeon: Felecia Shelling, DPM;  Location: [facility];  Service: Podiatry;  Laterality: Right;   CARDIAC CATHETERIZATION  1998   "couple before my OHS" (01/20/2013)   CARPAL TUNNEL RELEASE Right 1980's   CATARACT EXTRACTION W/ INTRAOCULAR LENS  IMPLANT, BILATERAL Bilateral ~ 2010   CORONARY ARTERY BYPASS GRAFT  1998   "CABG X4" (01/20/2013)   CYSTECTOMY  2000's   "cytal cyst on my intestines; probed then drained it; hospitalized for 13 days; NPO" (01/20/2013)   EYE SURGERY     HEMORRHOID BANDING  ~ 10/2012   IR FLUORO GUIDE CV LINE RIGHT  04/01/2019   IR US GUIDE VASC ACCESS RIGHT  04/01/2019   KNEE SURGERY Left 1964   "  exploratory; sewed it up w/wire" (01/20/2013)   LOWER EXTREMITY ANGIOGRAPHY Right 03/22/2019   Procedure: LOWER EXTREMITY ANGIOGRAPHY;  Surgeon: Renford Dills, MD;  Location: [facility];  Service: Cardiovascular;  Laterality: Right;   LOWER EXTREMITY ANGIOGRAPHY Left 03/29/2019   Procedure: LOWER EXTREMITY ANGIOGRAPHY;  Surgeon: Renford Dills, MD;  Location: [facility];  Service: Cardiovascular;  Laterality: Left;   PROSTATE SURGERY     SPINE SURGERY  11/24/1932   "Spina bifida surgery"   STRABISMUS SURGERY Bilateral 03/29/2015   Procedure: REPAIR STRABISMUS BILATERAL;  Surgeon: French Ana, MD;  Location: [facility];  Service: Ophthalmology;  Laterality: Bilateral;   WOUND  DEBRIDEMENT Right 04/08/2019   Procedure: DEBRIDEMENT WOUND;  Surgeon: Felecia Shelling, DPM;  Location: [facility];  Service: Podiatry;  Laterality: Right;     Home Medications:  Prior to Admission medications   Medication Sig Start Date End Date Taking? Authorizing Provider  acetaminophen (TYLENOL) 325 MG tablet Take 650 mg by mouth every 6 (six) hours as needed. 01/21/22  Yes [provider]  albuterol (VENTOLIN HFA) 108 (90 Base) MCG/ACT inhaler Inhale 2 puffs into the lungs every 6 (six) hours as needed for wheezing or shortness of breath. 10/01/21  Yes Salena Saner, MD  ARTIFICIAL TEARS 0.2-0.2-1 % SOLN Apply to eye 3 (three) times daily. Instill 2 drops into each eye 3 times daily. 02/20/20  Yes [provider]  Ashwagandha 300 MG TABS Take 1 capsule by mouth at bedtime. Take 460 mg by mouth at bedtime. 04/29/22  Yes [provider]  aspirin 81 MG tablet Take 81 mg by mouth daily.   Yes [provider]  buPROPion (WELLBUTRIN XL) 150 MG 24 hr tablet Take 150 mg by mouth daily.   Yes [provider]  clopidogrel (PLAVIX) 75 MG tablet Take 1 tablet (75 mg total) by mouth daily. 03/23/19  Yes Schnier, Latina Craver, MD  Coenzyme Q10 (CO Q-10) 100 MG CAPS Take 1 capsule by mouth daily. 01/29/22  Yes [provider]  diclofenac Sodium (VOLTAREN) 1 % GEL Apply topically. APPLY 2 GRAMS TOPICALLY TO EACH SHOULDER EVERY 6 HOURS AS NEEDED 08/08/22  Yes [provider]  docusate sodium (COLACE) 100 MG capsule Take 100 mg by mouth 2 (two) times daily.   Yes [provider]  famotidine (PEPCID) 10 MG tablet Take 10 mg by mouth daily.   Yes [provider]  faricimab-svoa (VABYSMO) 6 MG/0.05ML SOSY intravitreal injection 6 mg by Intravitreal route 3 (three) times daily. Administered by the [facility] 3 times a day on Friday every 5 weeks.   Yes [provider]  fluticasone (FLONASE) 50 MCG/ACT nasal spray Place  2 sprays into both nostrils daily. 02/16/23  Yes [provider]  Fluticasone-Umeclidin-Vilant (TRELEGY ELLIPTA) 100-62.5-25 MCG/ACT AEPB Inhale 1 puff into the lungs daily. 10/01/21  Yes Salena Saner, MD  guaiFENesin (MUCINEX) 600 MG 12 hr tablet Take 600 mg by mouth every 12 (twelve) hours as needed. 12/19/22  Yes [provider]  Insulin Glargine (BASAGLAR KWIKPEN) 100 UNIT/ML Inject 10 Units into the skin at bedtime.   Yes [provider]  insulin lispro (HUMALOG) 100 UNIT/ML KwikPen Inject subcutaneously as directed per sliding scale. 01/31/23  Yes [provider]  ipratropium-albuterol (DUONEB) 0.5-2.5 (3) MG/3ML SOLN 1 NEBULIZER SOLUTION ( ) BY ORAL INHALATION EVERY 4 TO 6 HOURS AS NEEDED 12/13/21  Yes [provider]  lactulose (CHRONULAC) 10 GM/15ML  solution Take 20 g by mouth 2 (two) times daily. 12/12/22  Yes [provider]  lidocaine (LINDAMANTLE) 3 % CREA cream Apply 1 Application topically as needed. Apply to left shoulder every 6 hours as needed along with diclofenac gel. 05/20/22  Yes [provider]  lidocaine 4 % Place 1 patch onto the skin daily. Apply topically to left chest. Remove at bedtime.   Yes [provider]  metoprolol tartrate (LOPRESSOR) 25 MG tablet Take 0.5 tablets (12.5 mg total) by mouth 2 (two) times daily. Patient taking differently: Take 12.5 mg by mouth daily. 12/15/18  Yes Dettinger, Elige Radon, MD  Multiple Vitamins-Minerals (PRESERVISION AREDS 2 PO) Take 1 capsule by mouth 2 (two) times daily.   Yes [provider]  nystatin (MYCOSTATIN/NYSTOP) powder Apply topically 2 (two) times daily. Apply to groin and perineum twice a day Patient taking differently: Apply topically 3 (three) times daily. Apply to groin and perineum every shift 03/06/20  Yes Lurene Shadow, MD  polyethylene glycol powder (GLYCOLAX/MIRALAX) 17 GM/SCOOP powder Take 17 g by mouth daily. 07/26/21  Yes Minna Antis, MD  pregabalin (LYRICA) 25 MG capsule Take 25 mg by mouth daily as needed.   Yes [provider]  pregabalin (LYRICA) 50 MG capsule Take 50 mg by mouth 2 (two) times daily.   Yes [provider]  rosuvastatin (CRESTOR) 10 MG tablet Take 10 mg by mouth at bedtime. 08/13/22  Yes [provider]  senna (SENOKOT) 8.6 MG tablet Take 2 tablets by mouth 2 (two) times daily.   Yes [provider]  senna-docusate (SENOKOT-S) 8.6-50 MG tablet Take 1 tablet by mouth at bedtime as needed for mild constipation. 02/19/22  Yes Roberto Scales D, MD  simethicone (GAS-X) 80 MG chewable tablet Chew 1 tablet (80 mg total) by mouth 4 (four) times daily as needed for flatulence. 04/28/23 04/27/24 Yes Sharman Cheek, MD  Sodium Phosphates (FLEET SALINE ENEMA) 7-19 GM/197ML ENEM Place rectally once.   Yes [provider]  12 HOUR NASAL DECONGESTANT 0.05 % nasal spray Place 2 sprays into both nostrils 2 (two) times daily. For 3 days Patient not taking: Reported on 08/18/2022 02/29/20   [provider]  Cadexomer Iodine (IODOFLEX) 0.9 % PADS Apply 1 application topically See admin instructions. Twice a week heel Patient not taking: Reported on 02/17/2022    [provider]  Dextromethorphan-guaiFENesin (MUCINEX DM) 30-600 MG TB12 Take 1 tablet by mouth 2 (two) times daily. For 5 days Patient not taking: Reported on 08/18/2022 02/29/20   [provider]  ezetimibe (ZETIA) 10 MG tablet Take 10 mg by mouth at bedtime. Patient not taking: Reported on 08/18/2022    [provider]  [facility] 10 GM/15ML SOLN Take 30 mLs by mouth every Monday, Wednesday, and Friday. 01/10/20   [provider]  JANUVIA 25 MG tablet Take 25 mg by mouth daily. 08/13/22   [provider]  melatonin 5 MG TABS Take 5 mg by mouth at bedtime. Patient not taking: Reported on 08/18/2022    [provider]  omeprazole (PRILOSEC) 20 MG capsule Take 20 mg  by mouth daily. Patient not taking: Reported on 05/13/2023 12/22/19   [provider]    Inpatient Medications: Scheduled Meds:  aspirin EC  81 mg Oral Daily   fluticasone furoate-vilanterol  1 puff Inhalation Daily   And   umeclidinium bromide  1 puff Inhalation Daily   insulin aspart  0-15 Units Subcutaneous Q4H   lactulose  20 g Oral Q M,W,F   metoprolol tartrate  12.5 mg Oral BID   pantoprazole  40 mg Oral Daily   rosuvastatin  10 mg Oral QHS   Continuous Infusions:  sodium chloride 40 mL/hr at 05/13/23 0631   lactated ringers Stopped (05/13/23 0139)   piperacillin-tazobactam (ZOSYN)  IV 3.375 g (05/13/23 0912)   PRN Meds: acetaminophen **OR** acetaminophen, albuterol, HYDROcodone-acetaminophen, morphine injection, ondansetron **OR** ondansetron (ZOFRAN) IV, senna-docusate  Allergies:    Allergies  Allergen Reactions   Sulfonamide Derivatives     REACTION: pruitis patient cant remember its been so long     Aspirin Rash    In high doses   Nitrofurantoin Rash   Penicillins Rash    Did it involve swelling of the face/tongue/throat, SOB, or low BP? No Did it involve sudden or severe rash/hives, skin peeling, or any reaction on the inside of your mouth or nose? No Did you need to seek medical attention at a hospital or doctor's office? No When did it last happen?      Teenager If all above answers are "NO", may proceed with cephalosporin use.   Sulfa Antibiotics Rash    Social History:   Social History   Socioeconomic History   Marital status: Widowed    Spouse name: Melton Krebs   Number of children: 4   Years of education: Not on file   Highest education level: Not on file  Occupational History   Occupation: Retired    Comment: [employer]   Tobacco Use   Smoking status: Former    Current packs/day: 0.00    Average packs/day: 2.0 packs/day for 31.7 years (63.5 ttl pk-yrs)    Types: Cigarettes    Start date: 11/20/1948    Quit date: 08/19/1980    Years since  quitting: 42.7   Smokeless tobacco: Former    Types: Chew   Tobacco comments:    01/20/2013 "quit chewing 20 yr ago"  Vaping Use   Vaping status: Never Used  Substance and Sexual Activity   Alcohol use: No    Comment: 01/20/2013 "quit drinking 08/19/1980"   Drug use: No   Sexual activity: Not Currently  Other Topics Concern   Not on file  Social History Narrative   Not on file   Social Drivers of Health   Financial Resource Strain: Not on file  Food Insecurity: No Food Insecurity (02/18/2022)   Hunger Vital Sign    Worried About Running Out of Food in the Last Year: Never true    Ran Out of Food in the Last Year: Never true  Transportation Needs: No Transportation Needs (02/18/2022)   PRAPARE - Administrator, Civil Service (Medical): No    Lack of Transportation (Non-Medical): No  Physical Activity: Not on file  Stress: Not on file  Social Connections: Not on file  Intimate Partner Violence: Not At Risk (02/18/2022)   Humiliation, Afraid, Rape, and Kick questionnaire    Fear of Current or Ex-Partner: No    Emotionally Abused: No    Physically Abused: No    Sexually Abused: No    Family History:    Family History  Problem Relation Age of Onset   Heart disease Mother    Heart attack Mother    Heart disease Father    Heart attack Father    Cancer Sister        breast   Cancer Sister        lung and ovarian  ROS:  Please see the history of present illness.   All other ROS reviewed and negative.     Physical Exam/Data:   Vitals:   05/13/23 0430 05/13/23 0730 05/13/23 0800 05/13/23 0836  BP: 128/60 111/64 120/67   Pulse: 84 84 95   Resp: (!) 31 (!) 34 (!) 33   Temp:    99 F (37.2 C)  TempSrc:    Oral  SpO2: 97% 99% 100%   Weight:      Height:        Intake/Output Summary (Last 24 hours) at 05/13/2023 1048 Last data filed at 05/13/2023 0139 Gross per 24 hour  Intake 89.92 ml  Output 450 ml  Net -360.08 ml      05/12/2023    3:13 PM  04/28/2023   12:16 AM 02/06/2023   11:32 PM  Last 3 Weights  Weight (lbs) 216 lb 0.8 oz 218 lb 217 lb  Weight (kg) 98 kg 98.884 kg 98.431 kg     Body mass index is 32.85 kg/m.  General:  Well nourished, well developed, in no acute distress HEENT: normal Neck: no JVD Vascular: No carotid bruits; Distal pulses 2+ bilaterally Cardiac:  normal S1, S2; RRR; no murmur  Lungs:  clear to auscultation bilaterally, no wheezing, rhonchi or rales  Abd: +tender, no hepatomegaly  Ext: no edema Musculoskeletal:  No deformities, BUE and BLE strength normal and equal Skin: warm and dry  Neuro:  CNs 2-12 intact, no focal abnormalities noted Psych:  Normal affect   EKG:  The EKG was personally reviewed and demonstrates:  NSR 94bpm,ant/lat/inf q waves, Qtc , nonspecific T wave changes, PACs  Relevant CV Studies:  Echo 02/2013 Study Conclusions   - Left ventricle: The cavity size was normal. Wall thickness    was normal. Systolic function was normal. The estimated    ejection fraction was 50%, in the range of 50% to 55%.    Wall motion was normal; there were no regional wall motion    abnormalities. Doppler parameters are consistent with    abnormal left ventricular relaxation (grade 1 diastolic    dysfunction).  - Aortic root: The aortic root was mildly dilated.  - Left atrium: The atrium was mildly dilated.  - Right ventricle: Systolic function was mildly reduced.   Laboratory Data:  High Sensitivity Troponin:   Recent Labs  Lab 05/12/23 2346 05/13/23 0151  TROPONINIHS 494* 443*     Chemistry Recent Labs  Lab 05/12/23 1541 05/13/23 0151 05/13/23 0617  NA 138 137  --   K 4.0 3.8  --   CL 101 104  --   CO2 21* 24  --   GLUCOSE 116* 135*  --   BUN 39* 40*  --   CREATININE 2.24* 2.15*  --   CALCIUM 6.6* 6.0*  --   MG  --   --  2.2  GFRNONAA 27* 29*  --   ANIONGAP 16* 9  --     Recent Labs  Lab 05/12/23 1541 05/13/23 0151  PROT 5.8* 5.6*  ALBUMIN 3.0* 2.8*  AST 13*  13*  ALT 9 10  ALKPHOS 56 62  BILITOT 0.6 1.0   Lipids No results for input(s): "CHOL", "TRIG", "HDL", "LABVLDL", "LDLCALC", "CHOLHDL" in the last 168 hours.  Hematology Recent Labs  Lab 05/12/23 1541 05/13/23 0617  WBC 19.0* 17.2*  RBC 4.01* 3.82*  HGB 11.7* 11.1*  HCT 37.3* 34.5*  MCV 93.0 90.3  MCH 29.2 29.1  MCHC  31.4 32.2  RDW 15.3 15.5  PLT 177 172   Thyroid No results for input(s): "TSH", "FREET4" in the last 168 hours.  BNPNo results for input(s): "BNP", "PROBNP" in the last 168 hours.  DDimer No results for input(s): "DDIMER" in the last 168 hours.   Radiology/Studies:  CT ABDOMEN PELVIS WO CONTRAST Result Date: 05/12/2023 CLINICAL DATA:  Abdominal pain for 2 days. Bowel obstruction suspected EXAM: CT ABDOMEN AND PELVIS WITHOUT CONTRAST TECHNIQUE: Multidetector CT imaging of the abdomen and pelvis was performed following the standard protocol without IV contrast. RADIATION DOSE REDUCTION: This exam was performed according to the departmental dose-optimization program which includes automated exposure control, adjustment of the mA and/or kV according to patient size and/or use of iterative reconstruction technique. COMPARISON:  CT abdomen pelvis 04/28/2023 FINDINGS: Lower chest: Bibasilar atelectasis/scarring.  No acute abnormality. Hepatobiliary: No acute abnormality. Pancreas: Fatty atrophy.  No acute abnormality. Spleen: Unremarkable. Adrenals/Urinary Tract: Normal adrenal glands. No urinary calculi or hydronephrosis. Unchanged nonspecific perinephric stranding. Thickening of the nondistended bladder. Stomach/Bowel: Stomach is within normal limits. No bowel obstruction. Moderate wall thickening about the rectum. Marked adjacent stranding and trace free fluid. There is gas within the left rectal/anal wall extending through the subcutaneous fat along the left gluteal cleft compatible with anocutaneous fistula. Vascular/Lymphatic: Aortic atherosclerosis. No enlarged abdominal or  pelvic lymph nodes. Reproductive: Prostatectomy. Other: No free intraperitoneal air.  No abscess. Musculoskeletal: No acute fracture. IMPRESSION: 1. Proctitis with left-sided anocutaneous fistula. No abscess. 2. Thickening of the nondistended bladder. Correlate with urinalysis to exclude cystitis. 3. Aortic Atherosclerosis (ICD10-I70.0). Electronically Signed   By: Minerva Fester M.D.   On: 05/12/2023 19:59     Assessment and Plan:   Elevated troponin CAD s/p remote CABG - presented with abdominal pain found to have proctitis and chronic constipation - HS troponin [phone] - EKG nonischemic - no chest pain reported - would hold on IV heparin - echo ordered - may be supply demand mismatch  - continue ASA 81mg  daily, Lopressor 12.5mg BID, Crestor 10mg  daily - given age and comorbidities not the best cath candidate. May consider non-invasive testing pending echo, however would not likely change management  Abdominal Pain - CT showed proctitis, perineal fistula and possible cystitis - surgery chronic constipation contributing - IV abx per IM  AKI on CKD stage 3 - s/p IVF with improvement - baseline around 2   For questions or updates, please contact [facility] Please consult [email or web address] for contact info under    Signed, Arkel Cartwright David Stall, PA-C  05/13/2023 10:48 AM

## 2023-05-13 NOTE — Assessment & Plan Note (Signed)
 Received an LR bolus in the ED Clear liquids ordered Monitor renal function and renally dose meds

## 2023-05-13 NOTE — Assessment & Plan Note (Signed)
 Calcium low at 6, 6.8 when corrected for albumin of 2.8 Will add on PTH intact and get magnesium

## 2023-05-13 NOTE — ED Notes (Signed)
 Pt c/o testicular pain that started today. Redness/swelling noted. This RN notified attending Sreeram MD.

## 2023-05-13 NOTE — Progress Notes (Signed)
 Courtesy note- No billing  Patient is seen and examined today morning. He is lying in bed, says he is hungry asks for food.  Patient denies any chest pain, shortness of breath.  Admitted abdominal pain better.  Surgery team evaluated him, advised no surgical intervention.  Patient will be continued on Zosyn therapy per pharmacy protocol.  Cardiology consulted for elevated troponins.  He does have low calcium will monitor PTH, magnesium levels.  Continue to monitor renal function, white count daily.  Further management as per clinical course.

## 2023-05-13 NOTE — H&P (Signed)
 History and Physical    Patient: [name]. TFT:[id number] DOB: [birth year] DOA: 05/12/2023 DOS: the patient was seen and examined on 05/13/2023 PCP: [facility]  Patient coming from: ALF/ILF  Chief Complaint:  Chief Complaint  Patient presents with   Abdominal Pain    HPI: [name]. is a 89 y.o. male with medical history significant for COPD, CAD s/p CABG (1989) and PAD s/p right AKA, CKD lllb, T2DM, who presents to the ED with a several hour history of abdominal pain with an x-ray at his facility concerning for small bowel obstruction.  He has had no nausea vomiting fever or chills.  Denies diarrhea, constipation or dysuria. ED Course and data review: Tachypneic to 32 with otherwise normal vitals Labs notable for WBC 19,000, lactic acid pending Hemoglobin 11.7 Lipase and LFTs WNL Creatinine 2.24 with anion gap of 16 bicarb 21.  Baseline creatinine 1.76 UA pending Troponin pending CT abdomen and pelvis with contrast concerning for proctitis with a left-sided anocutaneous fistula.  No abscess.  Possible cystitis-correlate with urinalysis Patient started on Zosyn and given an LR bolus Hospitalist consulted for admission.     Past Medical History:  Diagnosis Date   Agent orange exposure 1966 or 1971   Anemia    Anxiety    Arthritis    "hands and back" (01/20/2013)   CAD (coronary artery disease)    native vessel   Carotid artery disease (HCC)    nonobstructive   Cataract    Cecal diverticulitis 2008   drained   Cellulitis, gluteal    bilateral for the past 6 months/notes 01/20/2013   Chronic lower back pain    Chronic renal insufficiency    Depression    Diabetes mellitus type II    Exertional shortness of breath    HTN (hypertension)    Hyperlipidemia    Hypokalemia    Malnutrition (HCC)    protein-calorie   Myocardial infarction (HCC)    "silent; before OHS" (01/20/2013)   PTSD (post-traumatic stress disorder)    Spina bifida  (HCC)    Urinary incontinence    Past Surgical History:  Procedure Laterality Date   APPLICATION OF WOUND VAC Right 04/08/2019   Procedure: APPLICATION OF WOUND VAC;  Surgeon: Felecia Shelling, DPM;  Location: [facility];  Service: Podiatry;  Laterality: Right;   BONE BIOPSY Right 04/08/2019   Procedure: BONE BIOPSY;  Surgeon: Felecia Shelling, DPM;  Location: [facility];  Service: Podiatry;  Laterality: Right;   CARDIAC CATHETERIZATION  1998   "couple before my OHS" (01/20/2013)   CARPAL TUNNEL RELEASE Right 1980's   CATARACT EXTRACTION W/ INTRAOCULAR LENS  IMPLANT, BILATERAL Bilateral ~ 2010   CORONARY ARTERY BYPASS GRAFT  1998   "CABG X4" (01/20/2013)   CYSTECTOMY  2000's   "cytal cyst on my intestines; probed then drained it; hospitalized for 13 days; NPO" (01/20/2013)   EYE SURGERY     HEMORRHOID BANDING  ~ 10/2012   IR FLUORO GUIDE CV LINE RIGHT  04/01/2019   IR US GUIDE VASC ACCESS RIGHT  04/01/2019   KNEE SURGERY Left 1964   "exploratory; sewed it up w/wire" (01/20/2013)   LOWER EXTREMITY ANGIOGRAPHY Right 03/22/2019   Procedure: LOWER EXTREMITY ANGIOGRAPHY;  Surgeon: Renford Dills, MD;  Location: [facility];  Service: Cardiovascular;  Laterality: Right;   LOWER EXTREMITY ANGIOGRAPHY Left 03/29/2019   Procedure: LOWER EXTREMITY ANGIOGRAPHY;  Surgeon: Renford Dills, MD;  Location: [facility];  Service: Cardiovascular;  Laterality: Left;   PROSTATE SURGERY     SPINE SURGERY  11/24/1932   "Spina bifida surgery"   STRABISMUS SURGERY Bilateral 03/29/2015   Procedure: REPAIR STRABISMUS BILATERAL;  Surgeon: French Ana, MD;  Location: [facility];  Service: Ophthalmology;  Laterality: Bilateral;   WOUND DEBRIDEMENT Right 04/08/2019   Procedure: DEBRIDEMENT WOUND;  Surgeon: Felecia Shelling, DPM;  Location: [facility];  Service: Podiatry;  Laterality: Right;   Social History:  reports that he quit smoking about  42 years ago. His smoking use included cigarettes. He started smoking about 74 years ago. He has a 63.5 pack-year smoking history. He has quit using smokeless tobacco.  His smokeless tobacco use included chew. He reports that he does not drink alcohol and does not use drugs.  Allergies  Allergen Reactions   Sulfonamide Derivatives     REACTION: pruitis patient cant remember its been so long     Aspirin Rash    In high doses   Nitrofurantoin Rash   Penicillins Rash    Did it involve swelling of the face/tongue/throat, SOB, or low BP? No Did it involve sudden or severe rash/hives, skin peeling, or any reaction on the inside of your mouth or nose? No Did you need to seek medical attention at a hospital or doctor's office? No When did it last happen?      Teenager If all above answers are "NO", may proceed with cephalosporin use.   Sulfa Antibiotics Rash    Family History  Problem Relation Age of Onset   Heart disease Mother    Heart attack Mother    Heart disease Father    Heart attack Father    Cancer Sister        breast   Cancer Sister        lung and ovarian    Prior to Admission medications   Medication Sig Start Date End Date Taking? Authorizing Provider  12 HOUR NASAL DECONGESTANT 0.05 % nasal spray Place 2 sprays into both nostrils 2 (two) times daily. For 3 days Patient not taking: Reported on 08/18/2022 02/29/20   [provider]  acetaminophen (TYLENOL) 325 MG tablet Take 650 mg by mouth every 6 (six) hours as needed. 01/21/22   [provider]  albuterol (VENTOLIN HFA) 108 (90 Base) MCG/ACT inhaler Inhale 2 puffs into the lungs every 6 (six) hours as needed for wheezing or shortness of breath. 10/01/21   Salena Saner, MD  ARTIFICIAL TEARS 0.2-0.2-1 % SOLN Apply to eye 3 (three) times daily. Instill 2 drops into each eye 3 times daily. 02/20/20   [provider]  Ashwagandha 300 MG TABS Take 1 capsule by mouth daily. 04/29/22   [provider]  aspirin 81 MG tablet Take 81 mg by mouth daily.    [provider]  buPROPion (WELLBUTRIN XL) 150 MG 24 hr tablet Take 150 mg by mouth daily.    [provider]  Cadexomer Iodine (IODOFLEX) 0.9 % PADS Apply 1 application topically See admin instructions. Twice a week heel Patient not taking: Reported on 02/17/2022    [provider]  clopidogrel (PLAVIX) 75 MG tablet Take 1 tablet (75 mg total) by mouth daily. 03/23/19   Schnier, Latina Craver, MD  Coenzyme Q10 (CO Q-10) 100 MG CAPS Take 1 capsule by mouth daily. 01/29/22   [provider]  Dextromethorphan-guaiFENesin (MUCINEX DM) 30-600 MG TB12 Take 1  tablet by mouth 2 (two) times daily. For 5 days Patient not taking: Reported on 08/18/2022 02/29/20   [provider]  diclofenac Sodium (VOLTAREN) 1 % GEL Apply topically. APPLY 2 GRAMS TOPICALLY EVERY 6 HOURS AS NEEDED 08/08/22   [provider]  docusate sodium (COLACE) 100 MG capsule Take 100 mg by mouth at bedtime.    [provider]  ezetimibe (ZETIA) 10 MG tablet Take 10 mg by mouth at bedtime. Patient not taking: Reported on 08/18/2022    [provider]  Fluticasone-Umeclidin-Vilant (TRELEGY ELLIPTA) 100-62.5-25 MCG/ACT AEPB Inhale 1 puff into the lungs daily. 10/01/21   Salena Saner, MD  [facility] 10 GM/15ML SOLN Take 30 mLs by mouth every Monday, Wednesday, and Friday. 01/10/20   [provider]  ipratropium-albuterol (DUONEB) 0.5-2.5 (3) MG/3ML SOLN 1 NEBULIZER SOLUTION ( ) BY ORAL INHALATION EVERY 4 TO 6 HOURS AS NEEDED 12/13/21   [provider]  JANUVIA 25 MG tablet Take 25 mg by mouth daily. 08/13/22   [provider]  lidocaine (LINDAMANTLE) 3 % CREA cream Apply 1 Application topically as needed. 05/20/22   [provider]  melatonin 5 MG TABS Take 5 mg by mouth at bedtime. Patient not taking: Reported on 08/18/2022    [provider]  metoprolol tartrate  (LOPRESSOR) 25 MG tablet Take 0.5 tablets (12.5 mg total) by mouth 2 (two) times daily. 12/15/18   Dettinger, Elige Radon, MD  Multiple Vitamins-Minerals (PRESERVISION AREDS 2 PO) Take 1 capsule by mouth 2 (two) times daily. Patient not taking: Reported on 08/18/2022    [provider]  nystatin (MYCOSTATIN/NYSTOP) powder Apply topically 2 (two) times daily. Apply to groin and perineum twice a day Patient taking differently: Apply topically 2 (two) times daily. Apply to groin and perineum as needed 03/06/20   Lurene Shadow, MD  omeprazole (PRILOSEC) 20 MG capsule Take 20 mg by mouth daily. 12/22/19   [provider]  polyethylene glycol powder (GLYCOLAX/MIRALAX) 17 GM/SCOOP powder Take 17 g by mouth daily. 07/26/21   Minna Antis, MD  pregabalin (LYRICA) 25 MG capsule Take 25 mg by mouth as needed.    [provider]  pregabalin (LYRICA) 50 MG capsule Take 50 mg by mouth 2 (two) times daily.    [provider]  rosuvastatin (CRESTOR) 10 MG tablet Take 10 mg by mouth at bedtime. 08/13/22   [provider]  senna (SENOKOT) 8.6 MG tablet Take 2 tablets by mouth 2 (two) times daily.    [provider]  senna-docusate (SENOKOT-S) 8.6-50 MG tablet Take 1 tablet by mouth at bedtime as needed for mild constipation. 02/19/22   Laverna Peace, MD  simethicone (GAS-X) 80 MG chewable tablet Chew 1 tablet (80 mg total) by mouth 4 (four) times daily as needed for flatulence. 04/28/23 04/27/24  Sharman Cheek, MD    Physical Exam: Vitals:   05/12/23 1513 05/12/23 2230 05/12/23 2300 05/12/23 2352  BP:  124/61 104/65   Pulse:  93 (!) 101   Resp:  (!) 32 (!) 28   Temp:    98.2 F (36.8 C)  TempSrc:    Oral  SpO2:  96% 90%   Weight: 98 kg     Height: 5\' 8"  (1.727 m)      Physical Exam Vitals and nursing note reviewed.  Constitutional:      General: He is not in acute distress. HENT:     Head: Normocephalic and atraumatic.  Cardiovascular:     Rate  and Rhythm: Regular rhythm. Tachycardia present.     Heart sounds: Normal heart sounds.  Pulmonary:     Effort: Tachypnea present.     Breath sounds: Normal breath sounds.  Abdominal:     General: There is distension.     Palpations: Abdomen is soft.     Tenderness: There is no abdominal tenderness.  Neurological:     Mental Status: Mental status is at baseline.     Labs on Admission: I have personally reviewed following labs and imaging studies  CBC: Recent Labs  Lab 05/12/23 1541  WBC 19.0*  HGB 11.7*  HCT 37.3*  MCV 93.0  PLT 177   Basic Metabolic Panel: Recent Labs  Lab 05/12/23 1541  NA 138  K 4.0  CL 101  CO2 21*  GLUCOSE 116*  BUN 39*  CREATININE 2.24*  CALCIUM 6.6*   GFR: Estimated Creatinine Clearance: 24.9 mL/min (A) (by C-G formula based on SCr of 2.24 mg/dL (H)). Liver Function Tests: Recent Labs  Lab 05/12/23 1541  AST 13*  ALT 9  ALKPHOS 56  BILITOT 0.6  PROT 5.8*  ALBUMIN 3.0*   Recent Labs  Lab 05/12/23 1541  LIPASE 20   No results for input(s): "AMMONIA" in the last 168 hours. Coagulation Profile: No results for input(s): "INR", "PROTIME" in the last 168 hours. Cardiac Enzymes: No results for input(s): "CKTOTAL", "CKMB", "CKMBINDEX", "TROPONINI" in the last 168 hours. BNP (last 3 results) No results for input(s): "PROBNP" in the last 8760 hours. HbA1C: No results for input(s): "HGBA1C" in the last 72 hours. CBG: No results for input(s): "GLUCAP" in the last 168 hours. Lipid Profile: No results for input(s): "CHOL", "HDL", "LDLCALC", "TRIG", "CHOLHDL", "LDLDIRECT" in the last 72 hours. Thyroid Function Tests: No results for input(s): "TSH", "T4TOTAL", "FREET4", "T3FREE", "THYROIDAB" in the last 72 hours. Anemia Panel: No results for input(s): "VITAMINB12", "FOLATE", "FERRITIN", "TIBC", "IRON", "RETICCTPCT" in the last 72 hours. Urine analysis:    Component Value Date/Time   COLORURINE YELLOW (A) 09/28/2021 0429    APPEARANCEUR CLOUDY (A) 09/28/2021 0429   APPEARANCEUR Clear 03/31/2018 1134   LABSPEC 1.025 09/28/2021 0429   PHURINE 5.0 09/28/2021 0429   GLUCOSEU NEGATIVE 09/28/2021 0429   HGBUR LARGE (A) 09/28/2021 0429   BILIRUBINUR NEGATIVE 09/28/2021 0429   BILIRUBINUR Negative 03/31/2018 1134   KETONESUR NEGATIVE 09/28/2021 0429   PROTEINUR 30 (A) 09/28/2021 0429   UROBILINOGEN negative 11/16/2014 1132   UROBILINOGEN 0.2 01/19/2013 1547   NITRITE NEGATIVE 09/28/2021 0429   LEUKOCYTESUR LARGE (A) 09/28/2021 0429    Radiological Exams on Admission: CT ABDOMEN PELVIS WO CONTRAST Result Date: 05/12/2023 CLINICAL DATA:  Abdominal pain for 2 days. Bowel obstruction suspected EXAM: CT ABDOMEN AND PELVIS WITHOUT CONTRAST TECHNIQUE: Multidetector CT imaging of the abdomen and pelvis was performed following the standard protocol without IV contrast. RADIATION DOSE REDUCTION: This exam was performed according to the departmental dose-optimization program which includes automated exposure control, adjustment of the mA and/or kV according to patient size and/or use of iterative reconstruction technique. COMPARISON:  CT abdomen pelvis 04/28/2023 FINDINGS: Lower chest: Bibasilar atelectasis/scarring.  No acute abnormality. Hepatobiliary: No acute abnormality. Pancreas: Fatty atrophy.  No acute abnormality. Spleen: Unremarkable. Adrenals/Urinary Tract: Normal adrenal glands. No urinary calculi or hydronephrosis. Unchanged nonspecific perinephric stranding. Thickening of the nondistended bladder. Stomach/Bowel: Stomach is within normal limits. No bowel obstruction. Moderate wall thickening about the rectum. Marked adjacent stranding and trace free fluid. There is gas within the left rectal/anal wall extending through  the subcutaneous fat along the left gluteal cleft compatible with anocutaneous fistula. Vascular/Lymphatic: Aortic atherosclerosis. No enlarged abdominal or pelvic lymph nodes. Reproductive: Prostatectomy.  Other: No free intraperitoneal air.  No abscess. Musculoskeletal: No acute fracture. IMPRESSION: 1. Proctitis with left-sided anocutaneous fistula. No abscess. 2. Thickening of the nondistended bladder. Correlate with urinalysis to exclude cystitis. 3. Aortic Atherosclerosis (ICD10-I70.0). Electronically Signed   By: Minerva Fester M.D.   On: 05/12/2023 19:59     Data Reviewed: Relevant notes from primary care and specialist visits, past discharge summaries as available in EHR, including Care Everywhere. Prior diagnostic testing as pertinent to current admission diagnoses Updated medications and problem lists for reconciliation ED course, including vitals, labs, imaging, treatment and response to treatment Triage notes, nursing and pharmacy notes and ED provider's notes Notable results as noted in HPI   Assessment and Plan: * Abdominal pain Patient presents with abdominal pain Imaging showing proctitis and possible cystitis First troponin in the 400s On Zosyn to treat proctitis Continuing to trend troponin to evaluate for ACS presenting with abdominal pain Follow-up UA See management under each respective problem  CAD s/p CABG (coronary artery disease) Elevated troponin Troponin elevation 494-->443 suspect secondary to demand ischemia as patient has no chest pain, EKG non acute  Continue aspirin, rosuvastatin, ezetimibe and metoprolol Echocardiogram to evaluate for WMA  Acute proctitis Left anocutaneous fistula IV Zosyn Pain management N.p.o. in case of procedure Surgical consult  Hypocalcemia Calcium low at 6, 6.8 when corrected for albumin of 2.8 Will add on PTH intact and get magnesium  Acute renal failure superimposed on stage 3b chronic kidney disease (HCC) Received an LR bolus in the ED Clear liquids ordered Monitor renal function and renally dose meds  COPD (chronic obstructive pulmonary disease) (HCC) Not acutely exacerbated Continue home inhalers  PAD S/P  AKA (above knee amputation), right (HCC) Continue aspirin, rosuvastatin, ezetimibe and metoprolol  Type 2 diabetes mellitus with diabetic nephropathy (HCC) Sliding scale insulin coverage    DVT prophylaxis: SCD in case of procedure  Consults: Surgery, Dr. Maia Plan  Advance Care Planning:   Code Status: Limited: Do not attempt resuscitation (DNR) -DNR-LIMITED -Do Not Intubate/DNI    Family Communication: none  Disposition Plan: Back to previous home environment  Severity of Illness: The appropriate patient status for this patient is INPATIENT. Inpatient status is judged to be reasonable and necessary in order to provide the required intensity of service to ensure the patient's safety. The patient's presenting symptoms, physical exam findings, and initial radiographic and laboratory data in the context of their chronic comorbidities is felt to place them at high risk for further clinical deterioration. Furthermore, it is not anticipated that the patient will be medically stable for discharge from the hospital within 2 midnights of admission.   * I certify that at the point of admission it is my clinical judgment that the patient will require inpatient hospital care spanning beyond 2 midnights from the point of admission due to high intensity of service, high risk for further deterioration and high frequency of surveillance required.*  Author: Andris Baumann, MD 05/13/2023 12:25 AM  For on call review [email or web address].

## 2023-05-13 NOTE — ED Notes (Signed)
 [place] to bedside

## 2023-05-13 NOTE — Progress Notes (Signed)
 PHARMACY - ANTICOAGULATION CONSULT NOTE  Pharmacy Consult for Heparin Infusion Indication: chest pain/ACS  Allergies  Allergen Reactions   Sulfonamide Derivatives     REACTION: pruitis patient cant remember its been so long     Aspirin Rash    In high doses   Nitrofurantoin Rash   Penicillins Rash    Did it involve swelling of the face/tongue/throat, SOB, or low BP? No Did it involve sudden or severe rash/hives, skin peeling, or any reaction on the inside of your mouth or nose? No Did you need to seek medical attention at a hospital or doctor's office? No When did it last happen?      Teenager If all above answers are "NO", may proceed with cephalosporin use.   Sulfa Antibiotics Rash    Patient Measurements: Height: 5\' 8"  (172.7 cm) Weight: 98 kg (216 lb 0.8 oz) IBW/kg (Calculated) : 68.4 Heparin Dosing Weight: 89.2 kg  Vital Signs: Temp: 98.6 F (37 C) (03/19 1230) Temp Source: Oral (03/19 1230) BP: 137/74 (03/19 1230) Pulse Rate: 93 (03/19 1230)  Labs: Recent Labs    05/12/23 1541 05/12/23 2346 05/13/23 0151 05/13/23 0617 05/13/23 0955 05/13/23 1300  HGB 11.7*  --   --  11.1*  --   --   HCT 37.3*  --   --  34.5*  --   --   PLT 177  --   --  172  --   --   CREATININE 2.24*  --  2.15*  --   --   --   TROPONINIHS  --    < > 443*  --  468* 455*   < > = values in this interval not displayed.    Estimated Creatinine Clearance: 25.9 mL/min (A) (by C-G formula based on SCr of 2.15 mg/dL (H)).   Medical History: Past Medical History:  Diagnosis Date   Agent orange exposure 1966 or 1971   Anemia    Anxiety    Arthritis    "hands and back" (01/20/2013)   CAD (coronary artery disease)    native vessel   Carotid artery disease (HCC)    nonobstructive   Cataract    Cecal diverticulitis 2008   drained   Cellulitis, gluteal    bilateral for the past 6 months/notes 01/20/2013   Chronic lower back pain    Chronic renal insufficiency    Depression    Diabetes  mellitus type II    Exertional shortness of breath    HTN (hypertension)    Hyperlipidemia    Hypokalemia    Malnutrition (HCC)    protein-calorie   Myocardial infarction (HCC)    "silent; before OHS" (01/20/2013)   PTSD (post-traumatic stress disorder)    Spina bifida (HCC)    Urinary incontinence     Medications:  Not on anticoagulation at home per chart review  Assessment: Patient is a 89 year old male with a past medical history of CAD, hx of CABG, COPD, chronic diastolic CHF, CKD stage III, PAD s/p AKA, and T2DM who presented to ED for abdominal pain and was found to have elevated troponin level. Troponin  [id number]. Cardiology thinks this could supply/demand mismatch in the setting of abdominal pain/constipation and plan to start heparin infusion until echo results return.  Baseline INR and aPTT ordered.  No signs/symptoms of bleeding noted in chart. Hgb 11.1. PLT 172.  Goal of Therapy:  Heparin level 0.3-0.7 units/ml Monitor platelets by anticoagulation protocol: Yes   Plan:  Give 4000 unit bolus  x 1 Start heparin infusion at a rate of 1000 units/hr Check heparin level in 8 hours Monitor CBC daily while on heparin  Merryl Hacker, PharmD Clinical Pharmacist  05/13/2023,4:28 PM

## 2023-05-13 NOTE — Assessment & Plan Note (Addendum)
 Left anocutaneous fistula IV Zosyn Pain management N.p.o. in case of procedure Surgical consult

## 2023-05-13 NOTE — ED Notes (Signed)
 Patient changed after very large BM, placed in brief and resting comfortably.

## 2023-05-13 NOTE — Assessment & Plan Note (Signed)
 Sliding scale insulin coverage

## 2023-05-13 NOTE — Assessment & Plan Note (Signed)
 Continue aspirin, rosuvastatin, ezetimibe and metoprolol

## 2023-05-13 NOTE — Assessment & Plan Note (Addendum)
 Patient presents with abdominal pain Imaging showing proctitis and possible cystitis First troponin in the 400s On Zosyn to treat proctitis Continuing to trend troponin to evaluate for ACS presenting with abdominal pain Follow-up UA See management under each respective problem

## 2023-05-14 ENCOUNTER — Inpatient Hospital Stay (HOSPITAL_COMMUNITY)
Admit: 2023-05-14 | Discharge: 2023-05-14 | Disposition: A | Discharge status: Home / Self Care | Attending: Cardiovascular Disease | Admitting: Cardiovascular Disease

## 2023-05-14 ENCOUNTER — Encounter: Payer: Self-pay | Admitting: Internal Medicine

## 2023-05-14 ENCOUNTER — Other Ambulatory Visit

## 2023-05-14 DIAGNOSIS — E6609 Other obesity due to excess calories: Secondary | ICD-10-CM

## 2023-05-14 DIAGNOSIS — R7989 Other specified abnormal findings of blood chemistry: Secondary | ICD-10-CM | POA: Diagnosis not present

## 2023-05-14 DIAGNOSIS — N179 Acute kidney failure, unspecified: Secondary | ICD-10-CM | POA: Diagnosis not present

## 2023-05-14 DIAGNOSIS — R1084 Generalized abdominal pain: Secondary | ICD-10-CM | POA: Diagnosis not present

## 2023-05-14 DIAGNOSIS — J432 Centrilobular emphysema: Secondary | ICD-10-CM

## 2023-05-14 DIAGNOSIS — K6289 Other specified diseases of anus and rectum: Secondary | ICD-10-CM | POA: Diagnosis not present

## 2023-05-14 DIAGNOSIS — E1121 Type 2 diabetes mellitus with diabetic nephropathy: Secondary | ICD-10-CM

## 2023-05-14 DIAGNOSIS — Z6832 Body mass index (BMI) 32.0-32.9, adult: Secondary | ICD-10-CM

## 2023-05-14 DIAGNOSIS — N1832 Chronic kidney disease, stage 3b: Secondary | ICD-10-CM | POA: Diagnosis not present

## 2023-05-14 DIAGNOSIS — K5904 Chronic idiopathic constipation: Secondary | ICD-10-CM

## 2023-05-14 DIAGNOSIS — L8990 Pressure ulcer of unspecified site, unspecified stage: Secondary | ICD-10-CM | POA: Insufficient documentation

## 2023-05-14 DIAGNOSIS — E66811 Obesity, class 1: Secondary | ICD-10-CM

## 2023-05-14 DIAGNOSIS — I2510 Atherosclerotic heart disease of native coronary artery without angina pectoris: Secondary | ICD-10-CM | POA: Diagnosis not present

## 2023-05-14 DIAGNOSIS — E669 Obesity, unspecified: Secondary | ICD-10-CM | POA: Insufficient documentation

## 2023-05-14 LAB — COMPREHENSIVE METABOLIC PANEL
ALT: 10 U/L (ref 0–44)
AST: 13 U/L — ABNORMAL LOW (ref 15–41)
Albumin: 2.6 g/dL — ABNORMAL LOW (ref 3.5–5.0)
Alkaline Phosphatase: 50 U/L (ref 38–126)
Anion gap: 9 (ref 5–15)
BUN: 47 mg/dL — ABNORMAL HIGH (ref 8–23)
CO2: 21 mmol/L — ABNORMAL LOW (ref 22–32)
Calcium: 6.2 mg/dL — CL (ref 8.9–10.3)
Chloride: 107 mmol/L (ref 98–111)
Creatinine, Ser: 2.65 mg/dL — ABNORMAL HIGH (ref 0.61–1.24)
GFR, Estimated: 22 mL/min — ABNORMAL LOW (ref >60)
Glucose, Bld: 118 mg/dL — ABNORMAL HIGH (ref 70–99)
Potassium: 3.8 mmol/L (ref 3.5–5.1)
Sodium: 137 mmol/L (ref 135–145)
Total Bilirubin: 0.9 mg/dL (ref 0.0–1.2)
Total Protein: 5.4 g/dL — ABNORMAL LOW (ref 6.5–8.1)

## 2023-05-14 LAB — ECHOCARDIOGRAM COMPLETE
AR max vel: 2.36 cm2
AV Area VTI: 2.61 cm2
AV Area mean vel: 2.13 cm2
AV Mean grad: 2 mmHg
AV Peak grad: 4.8 mmHg
Ao pk vel: 1.1 m/s
Area-P 1/2: 3.99 cm2
Height: 68 in
MV VTI: 1.53 cm2
S' Lateral: 2.8 cm
Weight: 3456.81 [oz_av]

## 2023-05-14 LAB — CBC
HCT: 33.2 % — ABNORMAL LOW (ref 39.0–52.0)
Hemoglobin: 10.6 g/dL — ABNORMAL LOW (ref 13.0–17.0)
MCH: 30.5 pg (ref 26.0–34.0)
MCHC: 31.9 g/dL (ref 30.0–36.0)
MCV: 95.4 fL (ref 80.0–100.0)
Platelets: 147 10*3/uL — ABNORMAL LOW (ref 150–400)
RBC: 3.48 MIL/uL — ABNORMAL LOW (ref 4.22–5.81)
RDW: 15.7 % — ABNORMAL HIGH (ref 11.5–15.5)
WBC: 16.6 10*3/uL — ABNORMAL HIGH (ref 4.0–10.5)
nRBC: 0 % (ref 0.0–0.2)

## 2023-05-14 LAB — CBG MONITORING, ED
Glucose-Capillary: 118 mg/dL — ABNORMAL HIGH (ref 70–99)
Glucose-Capillary: 120 mg/dL — ABNORMAL HIGH (ref 70–99)
Glucose-Capillary: 107 mg/dL — ABNORMAL HIGH (ref 70–99)
Glucose-Capillary: 164 mg/dL — ABNORMAL HIGH (ref 70–99)

## 2023-05-14 LAB — GLUCOSE, CAPILLARY
Glucose-Capillary: 157 mg/dL — ABNORMAL HIGH (ref 70–99)
Glucose-Capillary: 99 mg/dL (ref 70–99)
Glucose-Capillary: 85 mg/dL (ref 70–99)

## 2023-05-14 LAB — HEPARIN LEVEL (UNFRACTIONATED): Heparin Unfractionated: <0.1 [IU]/mL — ABNORMAL LOW (ref 0.30–0.70)

## 2023-05-14 MED ORDER — HEPARIN BOLUS VIA INFUSION
2700.0000 [IU] | Freq: Once | INTRAVENOUS | Status: AC
  Administered 2023-05-14: 2700 [IU] via INTRAVENOUS
  Filled 2023-05-14: qty 2700

## 2023-05-14 MED ORDER — CALCIUM GLUCONATE-NACL 2-0.675 GM/100ML-% IV SOLN
2.0000 g | Freq: Once | INTRAVENOUS | Status: AC
  Administered 2023-05-14: 2000 mg via INTRAVENOUS
  Filled 2023-05-14: qty 100

## 2023-05-14 MED ORDER — PERFLUTREN LIPID MICROSPHERE
1.0000 mL | INTRAVENOUS | Status: AC | PRN
  Administered 2023-05-14: 4 mL via INTRAVENOUS

## 2023-05-14 MED ORDER — SODIUM CHLORIDE 0.9 % IV SOLN: INTRAVENOUS | Status: AC

## 2023-05-14 NOTE — Progress Notes (Signed)
 Patient ID: [name]., male   DOB: [birth year], 89 y.o.   MRN: [id number]     SURGICAL PROGRESS NOTE   Hospital Day(s): 1.   Interval History: Patient seen and examined, no acute events or new complaints overnight. Patient reports feeling better. He endorses he has had multiple bowel movements and the last one was big. Denies abdominal pain. Denies nausea or vomiting.   Vital signs in last 24 hours: [min-max] current  Temp:  [97.5 F (36.4 C)-98.1 F (36.7 C)] 97.5 F (36.4 C) (03/20 1607) Pulse Rate:  [62-83] 74 (03/20 1607) Resp:  [20-29] 20 (03/20 1421) BP: (85-133)/(33-72) 100/33 (03/20 1607) SpO2:  [91 %-100 %] 100 % (03/20 1607)     Height: 5\' 8"  (172.7 cm) Weight: 98 kg BMI (Calculated): 32.86   Physical Exam:  Constitutional: alert, cooperative and no distress  Respiratory: breathing non-labored at rest  Cardiovascular: regular rate and sinus rhythm  Gastrointestinal: soft, non-tender, and non-distended Rectal: no drainage. No abscess. Decreased rectal tone.   Labs:     Latest Ref Rng & Units 05/14/2023    7:50 AM 05/13/2023    6:17 AM 05/12/2023    3:41 PM  CBC  WBC 4.0 - 10.5 K/uL 16.6  17.2  19.0   Hemoglobin 13.0 - 17.0 g/dL 38.7  56.4  33.2   Hematocrit 39.0 - 52.0 % 33.2  34.5  37.3   Platelets 150 - 400 K/uL 147  172  177       Latest Ref Rng & Units 05/14/2023    7:50 AM 05/13/2023    1:51 AM 05/12/2023    3:41 PM  CMP  Glucose 70 - 99 mg/dL 951  884  166   BUN 8 - 23 mg/dL 47  40  39   Creatinine 0.61 - 1.24 mg/dL 0.63  0.16  0.10   Sodium 135 - 145 mmol/L 137  137  138   Potassium 3.5 - 5.1 mmol/L 3.8  3.8  4.0   Chloride 98 - 111 mmol/L 107  104  101   CO2 22 - 32 mmol/L 21  24  21    Calcium 8.9 - 10.3 mg/dL 6.2  6.0  6.6   Total Protein 6.5 - 8.1 g/dL 5.4  5.6  5.8   Total Bilirubin 0.0 - 1.2 mg/dL 0.9  1.0  0.6   Alkaline Phos 38 - 126 U/L 50  62  56   AST 15 - 41 U/L 13  13  13    ALT 0 - 44 U/L 10  10  9      Imaging studies: No new  pertinent imaging studies   Assessment/Plan:  89 y.o. male with proctitis with perianal fistula, complicated by pertinent comorbidities including spina bifida, coronary artery disease, hypertension, chronic kidney disease, carotid artery disease.   - Patient doing better -No abdominal pain.  -Multiple bowel movement -Slowly decreasing WBC count.  - No significant drainage from perianal fistula, no sign of abscess.  - Patient should be referred to Colorectal Surgery as outpatient at the [facility] for further follow up and control of perianal fistula.   Gae Gallop, MD

## 2023-05-14 NOTE — ED Notes (Signed)
 Patient had heparin infusing at 10 mL/hr; on rounding, patient's IV site where heparin was infusing was bleeding. Patient's right arm and gown saturated with blood. Heparin was stopped, patient's IV access was not salvageable and had to be removed due to bleeding. New IV access needed.

## 2023-05-14 NOTE — ED Notes (Signed)
 This RN and Gabriel Rung, RN provided peri-care, changed chux, linens, and gown. Applied new clean brief.

## 2023-05-14 NOTE — Progress Notes (Signed)
 Progress Note   Patient: [name]. MWN:[id number] DOB: 1930-11-08 DOA: 05/12/2023     1 DOS: the patient was seen and examined on 05/14/2023   Brief hospital course: Emory Gallentine. is a 89 y.o. male with medical history significant for COPD, CAD s/p CABG (1989) and PAD s/p right AKA, CKD lllb, T2DM, who presents to the ED with a several hour history of abdominal pain with an x-ray at his facility concerning for small bowel obstruction.   Patient is started on IV Zosyn therapy for proctitis, general surgery evaluated the patient recommended no surgical intervention for anocutaneous fistula.  Cardiology evaluated the patient for elevated troponin advised echocardiogram, no inpatient ischemic workup.  Patient's calcium low, kidney function worsening.  Nephrology consulted.  Assessment and Plan: * Abdominal pain Patient's abdominal much improved. Imaging showing proctitis and possible cystitis Patient will be continued on Zosyn to treat proctitis General Surgery evaluation appreciated.  No surgery intervention for left anocutaneous fistula. Patient able to tolerate it.  CAD s/p CABG (coronary artery disease) Elevated troponin Troponin elevation 494-->443 suspect secondary to demand ischemia as patient has no chest pain, EKG non acute  Continue aspirin, rosuvastatin, ezetimibe and metoprolol Cardiology evaluated the patient.  No further workup. Echocardiogram pending.  Hypocalcemia Calcium low at 6, 6.8 when corrected for albumin of 2.8 PTH pending. Nephrology consulted. IV calcium supplementation ordered.  Acute renal failure superimposed on stage 3b chronic kidney disease (HCC) Continue gentle IV fluids. Follow-up with nephrology Patient able to tolerate diet. Monitor renal function and renally dose meds  COPD (chronic obstructive pulmonary disease) (HCC) Not acutely exacerbated Continue home inhalers  PAD S/P AKA (above knee amputation), right (HCC) Continue  aspirin, rosuvastatin, ezetimibe and metoprolol  Type 2 diabetes mellitus with diabetic nephropathy (HCC) Sliding scale insulin coverage  Obesity class I BMI 32.85 Diet, exercise and weight reduction advised.    Continue pressure injury precautions. Out of bed to chair. Incentive spirometry. Nursing supportive care. Fall, aspiration precautions. Diet:  Diet Orders (From admission, onward)     Start     Ordered   05/13/23 1519  Diet full liquid Fluid consistency: Thin  Diet effective now       Question:  Fluid consistency:  Answer:  Thin   05/13/23 1518           DVT prophylaxis: SCDs Start: 05/13/23 0030  Level of care: Progressive   Code Status: Limited: Do not attempt resuscitation (DNR) -DNR-LIMITED -Do Not Intubate/DNI   Subjective: Patient is seen and examined today morning.  He is lying comfortably, eating his breakfast.  Denies any complaints of abdominal discomfort or chest discomfort.  Physical Exam: Vitals:   05/14/23 0700 05/14/23 0800 05/14/23 1200 05/14/23 1421  BP: 118/60 127/61 113/64 (!) 107/49  Pulse: 71 74 69 74  Resp: (!) 23  (!) 22 20  Temp:   97.7 F (36.5 C) 97.8 F (36.6 C)  TempSrc:    Oral  SpO2: 100% 100% 100% 93%  Weight:      Height:        General - Elderly obese Caucasian male, no apparent distress HEENT - PERRLA, EOMI, atraumatic head, non tender sinuses. Lung - Clear, basal rales, diffuse wheezes. Heart - S1, S2 heard, no murmurs, rubs, trace pedal edema. Abdomen - Soft, non tender, distended, bowel sounds good. Neuro - Alert, awake and oriented, non focal exam. Skin - Warm and dry.  Data Reviewed:      Latest Ref Rng &  Units 05/14/2023    7:50 AM 05/13/2023    6:17 AM 05/12/2023    3:41 PM  CBC  WBC 4.0 - 10.5 K/uL 16.6  17.2  19.0   Hemoglobin 13.0 - 17.0 g/dL 16.1  09.6  04.5   Hematocrit 39.0 - 52.0 % 33.2  34.5  37.3   Platelets 150 - 400 K/uL 147  172  177       Latest Ref Rng & Units 05/14/2023    7:50 AM  05/13/2023    1:51 AM 05/12/2023    3:41 PM  BMP  Glucose 70 - 99 mg/dL 409  811  914   BUN 8 - 23 mg/dL 47  40  39   Creatinine 0.61 - 1.24 mg/dL 7.82  9.56  2.13   Sodium 135 - 145 mmol/L 137  137  138   Potassium 3.5 - 5.1 mmol/L 3.8  3.8  4.0   Chloride 98 - 111 mmol/L 107  104  101   CO2 22 - 32 mmol/L 21  24  21    Calcium 8.9 - 10.3 mg/dL 6.2  6.0  6.6    US SCROTUM W/DOPPLER Result Date: 05/13/2023 CLINICAL DATA:  Scrotal pain and swelling, left greater than right EXAM: SCROTAL ULTRASOUND DOPPLER ULTRASOUND OF THE TESTICLES TECHNIQUE: Complete ultrasound examination of the testicles, epididymis, and other scrotal structures was performed. Color and spectral Doppler ultrasound were also utilized to evaluate blood flow to the testicles. COMPARISON:  05/12/2023 FINDINGS: Right testicle Measurements: 3.3 x 1.7 x 2.4 cm. No mass or microlithiasis visualized. Left testicle Measurements: 2.6 x 1.4 x 2.4 cm. No mass or microlithiasis visualized. Right epididymis: Heterogeneous appearance without focal abnormality or enlargement. Left epididymis: Heterogeneous appearance with increased vascularity on color Doppler imaging. Incidental 3 mm epididymal cyst of doubtful clinical significance. Hydrocele:  Trace right hydrocele. Varicocele:  None visualized. Pulsed Doppler interrogation of both testes demonstrates normal low resistance arterial and venous waveforms bilaterally. IMPRESSION: 1. Heterogeneous appearance of the left epididymis with increased vascularity which could reflect epididymitis. 2. Trace right hydrocele. 3. Otherwise unremarkable testicular ultrasound. Electronically Signed   By: Sharlet Salina M.D.   On: 05/13/2023 18:06   CT ABDOMEN PELVIS WO CONTRAST Result Date: 05/12/2023 CLINICAL DATA:  Abdominal pain for 2 days. Bowel obstruction suspected EXAM: CT ABDOMEN AND PELVIS WITHOUT CONTRAST TECHNIQUE: Multidetector CT imaging of the abdomen and pelvis was performed following the standard  protocol without IV contrast. RADIATION DOSE REDUCTION: This exam was performed according to the departmental dose-optimization program which includes automated exposure control, adjustment of the mA and/or kV according to patient size and/or use of iterative reconstruction technique. COMPARISON:  CT abdomen pelvis 04/28/2023 FINDINGS: Lower chest: Bibasilar atelectasis/scarring.  No acute abnormality. Hepatobiliary: No acute abnormality. Pancreas: Fatty atrophy.  No acute abnormality. Spleen: Unremarkable. Adrenals/Urinary Tract: Normal adrenal glands. No urinary calculi or hydronephrosis. Unchanged nonspecific perinephric stranding. Thickening of the nondistended bladder. Stomach/Bowel: Stomach is within normal limits. No bowel obstruction. Moderate wall thickening about the rectum. Marked adjacent stranding and trace free fluid. There is gas within the left rectal/anal wall extending through the subcutaneous fat along the left gluteal cleft compatible with anocutaneous fistula. Vascular/Lymphatic: Aortic atherosclerosis. No enlarged abdominal or pelvic lymph nodes. Reproductive: Prostatectomy. Other: No free intraperitoneal air.  No abscess. Musculoskeletal: No acute fracture. IMPRESSION: 1. Proctitis with left-sided anocutaneous fistula. No abscess. 2. Thickening of the nondistended bladder. Correlate with urinalysis to exclude cystitis. 3. Aortic Atherosclerosis (ICD10-I70.0). Electronically Signed  By: Minerva Fester M.D.   On: 05/12/2023 19:59    Family Communication: Discussed with patient, son yesterday. They  understand and agree. All questions answered.  Disposition: Status is: Inpatient Remains inpatient appropriate because: Weakness, AKI, hypocalcemia, elevated troponin.  Planned Discharge Destination: Skilled nursing facility     Time spent: 40 minutes  Author: Marcelino Duster, MD 05/14/2023 3:11 PM Secure chat 7am to 7pm For on call review [email or web address].

## 2023-05-14 NOTE — Consult Note (Signed)
 [facility]  CONSULT NOTE    Date: 05/14/2023                  Patient Name:  [name].  MRN: [id number]  DOB: [birth year]  Age / Sex: 89 y.o., male         PCP: [facility]                 Service Requesting Consult: TRH                 Reason for Consult: Acute kidney injury            History of Present Illness: [name]. is a 89 y.o.  male with past medical conditions including CAD status post CABG, PAD status post right AKA a, COPD, type 2 diabetes, and chronic kidney disease stage IIIb, who was admitted to [facility] on 05/12/2023 for Generalized abdominal pain [R10.84] Acute proctitis [K62.89]  Patient presented to the emergency department with complaints of abdominal pain.  Patient states he was in his usual state of health the night prior.  Denies any known fever or chills.  Patient is from a nursing facility, [facility], who shares concerns of possible small bowel obstruction.  Creatinine on ED arrival 1.76 and has peaked at 2.65.  Calcium decreased, 6.0 on ED arrival.  Elevated white count with decreased hemoglobin also noted.  CT abdomen pelvis shows proctitis with left-sided fistula with possible cystitis.  UA appears turbid with leukocytes and mild proteinuria.   Medications: Outpatient medications: Medications Prior to Admission  Medication Sig Dispense Refill Last Dose/Taking   acetaminophen (TYLENOL) 325 MG tablet Take 650 mg by mouth every 6 (six) hours as needed.   Taking As Needed   albuterol (VENTOLIN HFA) 108 (90 Base) MCG/ACT inhaler Inhale 2 puffs into the lungs every 6 (six) hours as needed for wheezing or shortness of breath. 18 g 6 Taking As Needed   ARTIFICIAL TEARS 0.2-0.2-1 % SOLN Apply to eye 3 (three) times daily. Instill 2 drops into each eye 3 times daily.   05/11/2023   Ashwagandha 300 MG TABS Take 1 capsule by mouth at bedtime. Take 460 mg by mouth at bedtime.   05/11/2023   aspirin 81 MG  tablet Take 81 mg by mouth daily.   05/11/2023   buPROPion (WELLBUTRIN XL) 150 MG 24 hr tablet Take 150 mg by mouth daily.   05/11/2023   clopidogrel (PLAVIX) 75 MG tablet Take 1 tablet (75 mg total) by mouth daily. 30 tablet 4 05/11/2023   Coenzyme Q10 (CO Q-10) 100 MG CAPS Take 1 capsule by mouth daily.   05/11/2023   diclofenac Sodium (VOLTAREN) 1 % GEL Apply topically. APPLY 2 GRAMS TOPICALLY TO EACH SHOULDER EVERY 6 HOURS AS NEEDED   05/12/2023 at  6:00 AM   docusate sodium (COLACE) 100 MG capsule Take 100 mg by mouth 2 (two) times daily.   05/11/2023   famotidine (PEPCID) 10 MG tablet Take 10 mg by mouth daily.   05/11/2023   faricimab-svoa (VABYSMO) 6 MG/0.05ML SOSY intravitreal injection 6 mg by Intravitreal route 3 (three) times daily. Administered by the [facility] 3 times a day on Friday every 5 weeks.   Taking   fluticasone (FLONASE) 50 MCG/ACT nasal spray Place 2 sprays into both nostrils daily.   05/11/2023   Fluticasone-Umeclidin-Vilant (TRELEGY ELLIPTA) 100-62.5-25 MCG/ACT AEPB Inhale 1 puff into the lungs daily. 60 each 11  05/11/2023   guaiFENesin (MUCINEX) 600 MG 12 hr tablet Take 600 mg by mouth every 12 (twelve) hours as needed.   Taking As Needed   Insulin Glargine (BASAGLAR KWIKPEN) 100 UNIT/ML Inject 10 Units into the skin at bedtime.   05/11/2023   insulin lispro (HUMALOG) 100 UNIT/ML KwikPen Inject subcutaneously as directed per sliding scale.   05/11/2023   ipratropium-albuterol (DUONEB) 0.5-2.5 (3) MG/3ML SOLN 1 NEBULIZER SOLUTION ( ) BY ORAL INHALATION EVERY 4 TO 6 HOURS AS NEEDED   Taking   lactulose (CHRONULAC) 10 GM/15ML solution Take 20 g by mouth 2 (two) times daily.   05/11/2023   lidocaine (LINDAMANTLE) 3 % CREA cream Apply 1 Application topically as needed. Apply to left shoulder every 6 hours as needed along with diclofenac gel.   Taking As Needed   lidocaine 4 % Place 1 patch onto the skin daily. Apply topically to left chest. Remove at bedtime.   05/11/2023   metoprolol  tartrate (LOPRESSOR) 25 MG tablet Take 0.5 tablets (12.5 mg total) by mouth 2 (two) times daily. (Patient taking differently: Take 12.5 mg by mouth daily.) 90 tablet 3 05/11/2023   Multiple Vitamins-Minerals (PRESERVISION AREDS 2 PO) Take 1 capsule by mouth 2 (two) times daily.   05/11/2023   nystatin (MYCOSTATIN/NYSTOP) powder Apply topically 2 (two) times daily. Apply to groin and perineum twice a day (Patient taking differently: Apply topically 3 (three) times daily. Apply to groin and perineum every shift) 15 g 0 Taking Differently   polyethylene glycol powder (GLYCOLAX/MIRALAX) 17 GM/SCOOP powder Take 17 g by mouth daily. 255 g 0 05/11/2023   pregabalin (LYRICA) 25 MG capsule Take 25 mg by mouth daily as needed.   Taking As Needed   pregabalin (LYRICA) 50 MG capsule Take 50 mg by mouth 2 (two) times daily.   05/11/2023   rosuvastatin (CRESTOR) 10 MG tablet Take 10 mg by mouth at bedtime.   05/11/2023   senna (SENOKOT) 8.6 MG tablet Take 2 tablets by mouth 2 (two) times daily.   05/11/2023   senna-docusate (SENOKOT-S) 8.6-50 MG tablet Take 1 tablet by mouth at bedtime as needed for mild constipation. 30 tablet 1 Taking As Needed   simethicone (GAS-X) 80 MG chewable tablet Chew 1 tablet (80 mg total) by mouth 4 (four) times daily as needed for flatulence. 60 tablet 0 Taking As Needed   Sodium Phosphates (FLEET SALINE ENEMA) 7-19 GM/197ML ENEM Place rectally once.   05/12/2023 at 12:30 AM   12 HOUR NASAL DECONGESTANT 0.05 % nasal spray Place 2 sprays into both nostrils 2 (two) times daily. For 3 days (Patient not taking: Reported on 08/18/2022)      Cadexomer Iodine (IODOFLEX) 0.9 % PADS Apply 1 application topically See admin instructions. Twice a week heel (Patient not taking: Reported on 02/17/2022)      Dextromethorphan-guaiFENesin (MUCINEX DM) 30-600 MG TB12 Take 1 tablet by mouth 2 (two) times daily. For 5 days (Patient not taking: Reported on 08/18/2022)      ezetimibe (ZETIA) 10 MG tablet Take 10 mg  by mouth at bedtime. (Patient not taking: Reported on 08/18/2022)      GENERLAC 10 GM/15ML SOLN Take 30 mLs by mouth every Monday, Wednesday, and Friday.      JANUVIA 25 MG tablet Take 25 mg by mouth daily.      melatonin 5 MG TABS Take 5 mg by mouth at bedtime. (Patient not taking: Reported on 08/18/2022)      omeprazole (PRILOSEC) 20 MG capsule  Take 20 mg by mouth daily. (Patient not taking: Reported on 05/13/2023)   Not Taking    Current medications: Current Facility-Administered Medications  Medication Dose Route Frequency Provider Last Rate Last Admin   0.9 %  sodium chloride infusion   Intravenous Continuous Wendee Beavers, NP 40 mL/hr at 05/14/23 1538 Infusion Verify at 05/14/23 1538   acetaminophen (TYLENOL) tablet 650 mg  650 mg Oral Q6H PRN Andris Baumann, MD       Or   acetaminophen (TYLENOL) suppository 650 mg  650 mg Rectal Q6H PRN Andris Baumann, MD       albuterol (PROVENTIL) (2.5 MG/3ML) 0.083% nebulizer solution 2.5 mg  2.5 mg Nebulization Q2H PRN Andris Baumann, MD       aspirin EC tablet 81 mg  81 mg Oral Daily Lindajo Royal V, MD   81 mg at 05/14/23 0849   fluticasone furoate-vilanterol (BREO ELLIPTA) 100-25 MCG/ACT 1 puff  1 puff Inhalation Daily Andris Baumann, MD   1 puff at 05/14/23 0848   And   umeclidinium bromide (INCRUSE ELLIPTA) 62.5 MCG/ACT 1 puff  1 puff Inhalation Daily Andris Baumann, MD   1 puff at 05/14/23 0848   HYDROcodone-acetaminophen (NORCO/VICODIN) 5-325 MG per tablet 1-2 tablet  1-2 tablet Oral Q4H PRN Andris Baumann, MD       insulin aspart (novoLOG) injection 0-15 Units  0-15 Units Subcutaneous Q4H Andris Baumann, MD   3 Units at 05/14/23 1709   lactulose (CHRONULAC) 10 GM/15ML solution 20 g  20 g Oral Q M,W,F Lindajo Royal V, MD   20 g at 05/13/23 0919   metoprolol tartrate (LOPRESSOR) tablet 12.5 mg  12.5 mg Oral BID Lindajo Royal V, MD   12.5 mg at 05/14/23 0849   morphine (PF) 2 MG/ML injection 2 mg  2 mg Intravenous Q2H PRN Andris Baumann, MD       ondansetron [facility]) tablet 4 mg  4 mg Oral Q6H PRN Andris Baumann, MD       Or   ondansetron [facility]) injection 4 mg  4 mg Intravenous Q6H PRN Andris Baumann, MD       pantoprazole (PROTONIX) EC tablet 40 mg  40 mg Oral Daily Lindajo Royal V, MD   40 mg at 05/14/23 0849   piperacillin-tazobactam (ZOSYN) IVPB 3.375 g  3.375 g Intravenous Q8H Sreeram, Narendranath, MD 12.5 mL/hr at 05/14/23 1713 3.375 g at 05/14/23 1713   rosuvastatin (CRESTOR) tablet 10 mg  10 mg Oral QHS Andris Baumann, MD   10 mg at 05/13/23 2345   senna-docusate (Senokot-S) tablet 1 tablet  1 tablet Oral QHS PRN Andris Baumann, MD          Allergies: Allergies  Allergen Reactions   Sulfonamide Derivatives     REACTION: pruitis patient cant remember its been so long     Aspirin Rash    In high doses   Nitrofurantoin Rash   Penicillins Rash    Did it involve swelling of the face/tongue/throat, SOB, or low BP? No Did it involve sudden or severe rash/hives, skin peeling, or any reaction on the inside of your mouth or nose? No Did you need to seek medical attention at a hospital or doctor's office? No When did it last happen?      Teenager If all above answers are "NO", may proceed with cephalosporin use.   Sulfa Antibiotics Rash      Past Medical History: Past  Medical History:  Diagnosis Date   Agent orange exposure 1966 or 1971   Anemia    Anxiety    Arthritis    "hands and back" (01/20/2013)   CAD (coronary artery disease)    native vessel   Carotid artery disease (HCC)    nonobstructive   Cataract    Cecal diverticulitis 2008   drained   Cellulitis, gluteal    bilateral for the past 6 months/notes 01/20/2013   Chronic lower back pain    Chronic renal insufficiency    Depression    Diabetes mellitus type II    Exertional shortness of breath    HTN (hypertension)    Hyperlipidemia    Hypokalemia    Malnutrition (HCC)    protein-calorie   Myocardial infarction (HCC)    "silent;  before OHS" (01/20/2013)   PTSD (post-traumatic stress disorder)    Spina bifida (HCC)    Urinary incontinence      Past Surgical History: Past Surgical History:  Procedure Laterality Date   APPLICATION OF WOUND VAC Right 04/08/2019   Procedure: APPLICATION OF WOUND VAC;  Surgeon: Felecia Shelling, DPM;  Location: [facility];  Service: Podiatry;  Laterality: Right;   BONE BIOPSY Right 04/08/2019   Procedure: BONE BIOPSY;  Surgeon: Felecia Shelling, DPM;  Location: [facility];  Service: Podiatry;  Laterality: Right;   CARDIAC CATHETERIZATION  1998   "couple before my OHS" (01/20/2013)   CARPAL TUNNEL RELEASE Right 1980's   CATARACT EXTRACTION W/ INTRAOCULAR LENS  IMPLANT, BILATERAL Bilateral ~ 2010   CORONARY ARTERY BYPASS GRAFT  1998   "CABG X4" (01/20/2013)   CYSTECTOMY  2000's   "cytal cyst on my intestines; probed then drained it; hospitalized for 13 days; NPO" (01/20/2013)   EYE SURGERY     HEMORRHOID BANDING  ~ 10/2012   IR FLUORO GUIDE CV LINE RIGHT  04/01/2019   IR US GUIDE VASC ACCESS RIGHT  04/01/2019   KNEE SURGERY Left 1964   "exploratory; sewed it up w/wire" (01/20/2013)   LOWER EXTREMITY ANGIOGRAPHY Right 03/22/2019   Procedure: LOWER EXTREMITY ANGIOGRAPHY;  Surgeon: Renford Dills, MD;  Location: [facility];  Service: Cardiovascular;  Laterality: Right;   LOWER EXTREMITY ANGIOGRAPHY Left 03/29/2019   Procedure: LOWER EXTREMITY ANGIOGRAPHY;  Surgeon: Renford Dills, MD;  Location: [facility];  Service: Cardiovascular;  Laterality: Left;   PROSTATE SURGERY     SPINE SURGERY  11/24/1932   "Spina bifida surgery"   STRABISMUS SURGERY Bilateral 03/29/2015   Procedure: REPAIR STRABISMUS BILATERAL;  Surgeon: French Ana, MD;  Location: [facility];  Service: Ophthalmology;  Laterality: Bilateral;   WOUND DEBRIDEMENT Right 04/08/2019   Procedure: DEBRIDEMENT WOUND;  Surgeon: Felecia Shelling, DPM;  Location: [facility];  Service: Podiatry;  Laterality: Right;     Family History: Family History  Problem Relation Age of Onset   Heart disease Mother    Heart attack Mother    Heart disease Father    Heart attack Father    Cancer Sister        breast   Cancer Sister        lung and ovarian     Social History: Social History   Socioeconomic History   Marital status: Widowed    Spouse name: Melton Krebs   Number of children: 4   Years of education: Not on file   Highest education level: Not on file  Occupational History   Occupation: Retired    Comment: [employer]   Tobacco Use   Smoking status: Former    Current packs/day: 0.00    Average packs/day: 2.0 packs/day for 31.7 years (63.5 ttl pk-yrs)    Types: Cigarettes    Start date: 11/20/1948    Quit date: 08/19/1980    Years since quitting: 42.7   Smokeless tobacco: Former    Types: Chew   Tobacco comments:    01/20/2013 "quit chewing 20 yr ago"  Vaping Use   Vaping status: Never Used  Substance and Sexual Activity   Alcohol use: No    Comment: 01/20/2013 "quit drinking 08/19/1980"   Drug use: No   Sexual activity: Not Currently  Other Topics Concern   Not on file  Social History Narrative   Not on file   Social Drivers of Health   Financial Resource Strain: Not on file  Food Insecurity: No Food Insecurity (05/14/2023)   Hunger Vital Sign    Worried About Running Out of Food in the Last Year: Never true    Ran Out of Food in the Last Year: Never true  Transportation Needs: No Transportation Needs (05/14/2023)   PRAPARE - Administrator, Civil Service (Medical): No    Lack of Transportation (Non-Medical): No  Physical Activity: Not on file  Stress: Not on file  Social Connections: Socially Isolated (05/14/2023)   Social Connection and Isolation Panel [NHANES]    Frequency of Communication with Friends and Family: More than three times a week    Frequency of Social Gatherings with Friends and Family: Twice a week     Attends Religious Services: Never    Database administrator or Organizations: No    Attends Banker Meetings: Never    Marital Status: Widowed  Intimate Partner Violence: Not At Risk (05/14/2023)   Humiliation, Afraid, Rape, and Kick questionnaire    Fear of Current or Ex-Partner: No    Emotionally Abused: No    Physically Abused: No    Sexually Abused: No     Review of Systems: Review of Systems  Constitutional:  Negative for chills, fever and malaise/fatigue.  HENT:  Negative for congestion, sore throat and tinnitus.   Eyes:  Negative for blurred vision and redness.  Respiratory:  Negative for cough, shortness of breath and wheezing.   Cardiovascular:  Negative for chest pain, palpitations, claudication and leg swelling.  Gastrointestinal:  Positive for abdominal pain. Negative for blood in stool, diarrhea, nausea and vomiting.  Genitourinary:  Negative for flank pain, frequency and hematuria.  Musculoskeletal:  Negative for back pain, falls and myalgias.  Skin:  Negative for rash.  Neurological:  Negative for dizziness, weakness and headaches.  Endo/Heme/Allergies:  Does not bruise/bleed easily.  Psychiatric/Behavioral:  Negative for depression. The patient is not nervous/anxious and does not have insomnia.     Vital Signs: Blood pressure (!) 100/33, pulse 74, temperature (!) 97.5 F (36.4 C), resp. rate 20, height 5\' 8"  (1.727 m), weight 98 kg, SpO2 100%.  Weight trends: Filed Weights   05/12/23 1513  Weight: 98 kg    Physical Exam: General: NAD  Head: Normocephalic, atraumatic. Moist oral mucosal membranes  Eyes: Anicteric  Lungs:  Clear to auscultation, normal effort  Heart: Regular rate and rhythm  Abdomen:  Soft, nontender,   Extremities: 2+ pitting peripheral edema.  Neurologic: Alert, moving all four extremities  Skin: No lesions  Access: None     Lab results:  Basic Metabolic Panel: Recent Labs  Lab 05/12/23 1541 05/13/23 0151  05/13/23 0617 05/14/23 0750  NA 138 137  --  137  K 4.0 3.8  --  3.8  CL 101 104  --  107  CO2 21* 24  --  21*  GLUCOSE 116* 135*  --  118*  BUN 39* 40*  --  47*  CREATININE 2.24* 2.15*  --  2.65*  CALCIUM 6.6* 6.0*  --  6.2*  MG  --   --  2.2  --     Liver Function Tests: Recent Labs  Lab 05/12/23 1541 05/13/23 0151 05/14/23 0750  AST 13* 13* 13*  ALT 9 10 10   ALKPHOS 56 62 50  BILITOT 0.6 1.0 0.9  PROT 5.8* 5.6* 5.4*  ALBUMIN 3.0* 2.8* 2.6*   Recent Labs  Lab 05/12/23 1541  LIPASE 20   No results for input(s): "AMMONIA" in the last 168 hours.  CBC: Recent Labs  Lab 05/12/23 1541 05/13/23 0617 05/14/23 0750  WBC 19.0* 17.2* 16.6*  HGB 11.7* 11.1* 10.6*  HCT 37.3* 34.5* 33.2*  MCV 93.0 90.3 95.4  PLT 177 172 147*    Cardiac Enzymes: No results for input(s): "CKTOTAL", "CKMB", "CKMBINDEX", "TROPONINI" in the last 168 hours.  BNP: Invalid input(s): "POCBNP"  CBG: Recent Labs  Lab 05/14/23 0129 05/14/23 0601 05/14/23 0758 05/14/23 1215 05/14/23 1634  GLUCAP 118* 120* 107* 164* 157*    Microbiology: Results for orders placed or performed during the hospital encounter of 05/12/23  Blood culture (routine x 2)     Status: None (Preliminary result)   Collection Time: 05/12/23 11:35 PM   Specimen: BLOOD  Result Value Ref Range Status   Specimen Description BLOOD LAC  Final   Special Requests   Final    BOTTLES DRAWN AEROBIC AND ANAEROBIC Blood Culture results may not be optimal due to an inadequate volume of blood received in culture bottles   Culture   Final    NO GROWTH 2 DAYS Performed at [facility], [address] [place], [place] [address]    Report Status PENDING  Incomplete  Blood culture (routine x 2)     Status: None (Preliminary result)   Collection Time: 05/12/23 11:46 PM   Specimen: BLOOD  Result Value Ref Range Status   Specimen Description BLOOD RW`  Final   Special Requests   Final    BOTTLES DRAWN AEROBIC AND  ANAEROBIC Blood Culture adequate volume   Culture   Final    NO GROWTH 2 DAYS Performed at [facility], [address] [place], [place] [address]    Report Status PENDING  Incomplete  Urine Culture     Status: Abnormal (Preliminary result)   Collection Time: 05/13/23 12:35 AM   Specimen: Urine, Random  Result Value Ref Range Status   Specimen Description   Final    URINE, RANDOM Performed at [facility], [address] [place], [place] [address]    Special Requests   Final    NONE Reflexed from [id number] Performed at [facility], [address] [place], [place] [address]    Culture (A)  Final    >=100,000 COLONIES/mL PROTEUS MIRABILIS SUSCEPTIBILITIES TO FOLLOW 30,000 COLONIES/mL AEROCOCCUS SPECIES Standardized susceptibility testing for this organism is not available. Performed at [facility], [address], [place], [place] [address]    Report Status PENDING  Incomplete    Coagulation Studies: Recent Labs    05/13/23 1738  LABPROT 16.7*  INR 1.3*    Urinalysis: Recent Labs    05/13/23 0035  COLORURINE YELLOW*  LABSPEC 1.013  PHURINE 5.0  GLUCOSEU NEGATIVE  HGBUR SMALL*  BILIRUBINUR NEGATIVE  KETONESUR NEGATIVE  PROTEINUR 100*  NITRITE NEGATIVE  LEUKOCYTESUR LARGE*      Imaging: ECHOCARDIOGRAM COMPLETE Result Date: 05/14/2023    ECHOCARDIOGRAM REPORT   Patient Name:   Mahamed Zalewski. Date of Exam: 05/14/2023 Medical Rec #:  [id number]           Height:       68.0 in Accession #:    [id number]          Weight:       216.0 lb Date of Birth:  November 01, 1930           BSA:          2.112 m Patient Age:    89 years            BP:           127/61 mmHg Patient Gender: M                   HR:           71 bpm. Exam Location:  [facility] Procedure: 2D Echo, Cardiac Doppler, Color Doppler and Intracardiac            Opacification Agent (Both Spectral and Color Flow Doppler were            utilized during procedure). Indications:      Elevated Troponin  History:         Patient has prior history of Echocardiogram examinations, most                  recent 03/16/2013. CHF, CAD, Prior CABG, COPD; Risk                  Factors:Hypertension and Diabetes. CKD.  Sonographer:     Mikki Harbor Referring Phys:  [id number] Antonieta Iba Diagnosing Phys: Julien Nordmann MD  Sonographer Comments: Technically difficult study due to poor echo windows, no subcostal window and patient is obese. IMPRESSIONS  1. Left ventricular ejection fraction, by estimation, is 55 to 60%. The left ventricle has normal function. The left ventricle has no regional wall motion abnormalities. There is mild left ventricular hypertrophy. Left ventricular diastolic parameters are indeterminate.  2. Right ventricular systolic function is normal. The right ventricular size is normal.  3. The mitral valve is normal in structure. Mild mitral valve regurgitation. No evidence of mitral stenosis.  4. The aortic valve is normal in structure. There is mild calcification of the aortic valve. Aortic valve regurgitation is not visualized. Aortic valve sclerosis is present, with no evidence of aortic valve stenosis.  5. The inferior vena cava is normal in size with greater than 50% respiratory variability, suggesting right atrial pressure of 3 mmHg. FINDINGS  Left Ventricle: Left ventricular ejection fraction, by estimation, is 55 to 60%. The left ventricle has normal function. The left ventricle has no regional wall motion abnormalities. Definity contrast agent was given IV to delineate the left ventricular  endocardial borders. Strain was performed and the global longitudinal strain is indeterminate. The left ventricular internal cavity size was normal in size. There is mild left ventricular hypertrophy. Left ventricular diastolic parameters are indeterminate. Right Ventricle: The right ventricular size is normal. No increase in right ventricular wall thickness. Right ventricular systolic  function is normal.  Left Atrium: Left atrial size was normal in size. Right Atrium: Right atrial size was normal in size. Pericardium: There is no evidence of pericardial effusion. Mitral Valve: The mitral valve is normal in structure. Mild mitral valve regurgitation. No evidence of mitral valve stenosis. MV peak gradient, 4.0 mmHg. The mean mitral valve gradient is 1.0 mmHg. Tricuspid Valve: The tricuspid valve is normal in structure. Tricuspid valve regurgitation is not demonstrated. No evidence of tricuspid stenosis. Aortic Valve: The aortic valve is normal in structure. There is mild calcification of the aortic valve. Aortic valve regurgitation is not visualized. Aortic valve sclerosis is present, with no evidence of aortic valve stenosis. Aortic valve mean gradient  measures 2.0 mmHg. Aortic valve peak gradient measures 4.8 mmHg. Aortic valve area, by VTI measures 2.61 cm. Pulmonic Valve: The pulmonic valve was normal in structure. Pulmonic valve regurgitation is not visualized. No evidence of pulmonic stenosis. Aorta: The aortic root is normal in size and structure. Venous: The inferior vena cava is normal in size with greater than 50% respiratory variability, suggesting right atrial pressure of 3 mmHg. IAS/Shunts: No atrial level shunt detected by color flow Doppler. Additional Comments: 3D was performed not requiring image post processing on an independent workstation and was indeterminate.  LEFT VENTRICLE PLAX 2D LVIDd:         4.80 cm   Diastology LVIDs:         2.80 cm   LV e' medial:    4.50 cm/s LV PW:         1.50 cm   LV E/e' medial:  15.8 LV IVS:        1.20 cm   LV e' lateral:   9.62 cm/s LVOT diam:     2.00 cm   LV E/e' lateral: 7.4 LV SV:         57 LV SV Index:   27 LVOT Area:     3.14 cm  RIGHT VENTRICLE RV Basal diam:  3.80 cm RV Mid diam:    3.50 cm RV S prime:     12.30 cm/s LEFT ATRIUM           Index        RIGHT ATRIUM           Index LA diam:      4.50 cm 2.13 cm/m   RA Area:     18.10  cm LA Vol (A4C): 38.8 ml 18.37 ml/m  RA Volume:   49.20 ml  23.30 ml/m  AORTIC VALVE                    PULMONIC VALVE AV Area (Vmax):    2.36 cm     PV Vmax:       0.80 m/s AV Area (Vmean):   2.13 cm     PV Peak grad:  2.6 mmHg AV Area (VTI):     2.61 cm AV Vmax:           110.00 cm/s AV Vmean:          67.900 cm/s AV VTI:            0.217 m AV Peak Grad:      4.8 mmHg AV Mean Grad:      2.0 mmHg LVOT Vmax:         82.60 cm/s LVOT Vmean:        46.100 cm/s LVOT VTI:          0.180  m LVOT/AV VTI ratio: 0.83  AORTA Ao Root diam: 3.90 cm Ao Asc diam:  3.60 cm MITRAL VALVE MV Area (PHT): 3.99 cm    SHUNTS MV Area VTI:   1.53 cm    Systemic VTI:  0.18 m MV Peak grad:  4.0 mmHg    Systemic Diam: 2.00 cm MV Mean grad:  1.0 mmHg MV Vmax:       1.00 m/s MV Vmean:      54.5 cm/s MV Decel Time: 190 msec MV E velocity: 71.00 cm/s MV A velocity: 79.50 cm/s MV E/A ratio:  0.89 Julien Nordmann MD Electronically signed by Julien Nordmann MD Signature Date/Time: 05/14/2023/3:34:47 PM    Final    US SCROTUM W/DOPPLER Result Date: 05/13/2023 CLINICAL DATA:  Scrotal pain and swelling, left greater than right EXAM: SCROTAL ULTRASOUND DOPPLER ULTRASOUND OF THE TESTICLES TECHNIQUE: Complete ultrasound examination of the testicles, epididymis, and other scrotal structures was performed. Color and spectral Doppler ultrasound were also utilized to evaluate blood flow to the testicles. COMPARISON:  05/12/2023 FINDINGS: Right testicle Measurements: 3.3 x 1.7 x 2.4 cm. No mass or microlithiasis visualized. Left testicle Measurements: 2.6 x 1.4 x 2.4 cm. No mass or microlithiasis visualized. Right epididymis: Heterogeneous appearance without focal abnormality or enlargement. Left epididymis: Heterogeneous appearance with increased vascularity on color Doppler imaging. Incidental 3 mm epididymal cyst of doubtful clinical significance. Hydrocele:  Trace right hydrocele. Varicocele:  None visualized. Pulsed Doppler interrogation of both  testes demonstrates normal low resistance arterial and venous waveforms bilaterally. IMPRESSION: 1. Heterogeneous appearance of the left epididymis with increased vascularity which could reflect epididymitis. 2. Trace right hydrocele. 3. Otherwise unremarkable testicular ultrasound. Electronically Signed   By: Sharlet Salina M.D.   On: 05/13/2023 18:06     Assessment & Plan: Mr. Bodie Abernethy. is a 89 y.o.  male with past medical conditions including CAD status post CABG, PAD status post right AKA a, COPD, type 2 diabetes, and chronic kidney disease stage IIIb, who was admitted to [facility] on 05/12/2023 for Generalized abdominal pain [R10.84] Acute proctitis [K62.89]   Acute kidney injury on chronic kidney disease stage IIIb.  Baseline creatinine appears to be 1.76 on admission.  Acute kidney injury etiology is unclear at this time, likely infectious source.  CT abdomen pelvis negative for obstruction but shows thickening bladder wall, concerning for cystitis.  UA appears turbid with leukocytes and mild proteinuria.  Creatinine has peaked at 2.65.  No urine output recorded.  No acute indication of dialysis.  Will encourage nursing staff to document all urine output.  2.  Hypocalcemia, likely secondary to kidney injury.  Serum calcium 6.2, corrected calcium 7.3.  Supplementation ordered by primary team.  3. Anemia of chronic kidney disease Lab Results  Component Value Date   HGB 10.6 (L) 05/14/2023    Hemoglobin within optimal range.  No need for ESA's at this time.  4.  Diabetes mellitus type II with chronic kidney disease/renal manifestations: insulin dependent. Home regimen includes Humalog, Lantus, and Januvia. Most recent hemoglobin A1c is 7.6 on 05/12/2023.    LOS: 1 Talishia Betzler 3/20/20257:52 PM

## 2023-05-14 NOTE — TOC Initial Note (Addendum)
 Transition of Care (TOC) - Initial/Assessment Note    Patient Details  Name: [name]. MRN: [id number] Date of Birth: August 21, 1930  Transition of Care [facility]) CM/SW Contact:    Margarito Liner, LCSW Phone Number: 05/14/2023, 12:34 PM  Clinical Narrative:  Readmission prevention screen complete. CSW met with patient. No supports at bedside. CSW introduced role and explained that discharge planning would be discussed. PCP is the [facility]. They transport him to appointments. Patient is a long-term resident at [facility] under [place] contract. He will need a new VA authorization if he is still here past tomorrow. No further concerns. CSW will continue to follow patient for support and facilitate return to SNF once medically stable.           2:58 pm: Per MD, potential discharge tomorrow. [facility] liaison is aware.  Expected Discharge Plan: Skilled Nursing Facility Barriers to Discharge: Continued Medical Work up   Patient Goals and CMS Choice            Expected Discharge Plan and Services     Post Acute Care Choice: Resumption of Svcs/PTA Provider Living arrangements for the past 2 months: Skilled Nursing Facility                                      Prior Living Arrangements/Services Living arrangements for the past 2 months: Skilled Nursing Facility Lives with:: Facility Resident Patient language and need for interpreter reviewed:: Yes Do you feel safe going back to the place where you live?: Yes      Need for Family Participation in Patient Care: Yes (Comment) Care giver support system in place?: Yes (comment)   Criminal Activity/Legal Involvement Pertinent to Current Situation/Hospitalization: No - Comment as needed  Activities of Daily Living      Permission Sought/Granted Permission sought to share information with : Facility Industrial/product designer granted to share information with : Yes, Verbal Permission Granted      Permission granted to share info w AGENCY: [facility] SNF        Emotional Assessment Appearance:: Appears stated age Attitude/Demeanor/Rapport: Engaged, Gracious Affect (typically observed): Accepting, Appropriate, Calm, Pleasant Orientation: : Oriented to Self, Oriented to Place, Oriented to  Time, Oriented to Situation Alcohol / Substance Use: Not Applicable Psych Involvement: No (comment)  Admission diagnosis:  Acute proctitis [K62.89] Patient Active Problem List   Diagnosis Date Noted   Acute proctitis 05/13/2023   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/13/2023   Acute renal failure superimposed on stage 3b chronic kidney disease (HCC) 05/13/2023   Elevated troponin 05/13/2023   Abdominal pain 05/13/2023   Hypocalcemia 05/13/2023   COPD (chronic obstructive pulmonary disease) (HCC) 02/18/2022   Influenza A 02/17/2022   Bilateral pneumonia 03/03/2020   Stage 3b chronic kidney disease (CKD) (HCC) 03/02/2020   Acute pyelonephritis 03/02/2020   PAD S/P AKA (above knee amputation), right (HCC) 03/02/2020   Sepsis (HCC) 03/02/2020   Pyelonephritis 03/02/2020   PVD (peripheral vascular disease) (HCC) 03/25/2019   Osteomyelitis of right foot (HCC) 03/25/2019   Frequency of urination and polyuria 03/25/2019   Atherosclerosis of native arteries of the extremities with ulceration (HCC) 03/14/2019   COVID-19 virus infection 03/14/2019   Sacral pressure sore 11/25/2018   Arterial hemorrhage 11/18/2018   Hypoglycemia associated with type 2 diabetes mellitus (HCC) 11/18/2018   General weakness 09/22/2016   Bilateral carpal tunnel syndrome  09/22/2016   Leukocytosis 07/30/2015   Chronic heart failure with preserved ejection fraction (HFpEF) (HCC) 09/22/2013   Urinary incontinence 12/13/2012   Chronic idiopathic constipation 11/29/2012   Metabolic syndrome 11/29/2012   Spina bifida (HCC) 11/18/2012   CAD s/p CABG (coronary artery disease) 03/08/2009   Type 2 diabetes mellitus with diabetic  nephropathy (HCC) 10/29/2008   Type 2 diabetes mellitus with stage 3 chronic kidney disease, without long-term current use of insulin (HCC) 10/29/2008   PTSD 10/29/2008   Essential hypertension 10/29/2008   PCP:  [facility]:   [facility] DELIVERY - [place], [place] - [address] [place] [address] Phone: [phone] Fax: [phone]  [facility] [place], [place] - [address] [place] [address] Phone: [phone] Fax: [phone]     Social Drivers of Health (SDOH) Social History: SDOH Screenings   Food Insecurity: No Food Insecurity (02/18/2022)  Housing: Low Risk  (02/18/2022)  Transportation Needs: No Transportation Needs (02/18/2022)  Utilities: Not At Risk (02/18/2022)  Depression (PHQ2-9): Low Risk  (04/07/2019)  Tobacco Use: Medium Risk (05/12/2023)   SDOH Interventions:     Readmission Risk Interventions    05/14/2023   12:33 PM  Readmission Risk Prevention Plan  Transportation Screening Complete  PCP or Specialist Appt within 3-5 Days Complete  Social Work Consult for Recovery Care Planning/Counseling Complete  Palliative Care Screening Not Applicable  Medication Review Oceanographer) Complete

## 2023-05-14 NOTE — [place] FL2 (Signed)
 [place] MEDICAID FL2 LEVEL OF CARE FORM     IDENTIFICATION  Patient Name: [name]. Birthdate: 01/13/1931 Sex: male Admission Date (Current Location): 05/12/2023  [facility] and [place] Number:  Chiropodist and Address:  [facility], [address], [place], [place] [address]      Provider Number: [id number]  Attending Physician Name and Address:  Marcelino Duster, MD  Relative Name and Phone Number:       Current Level of Care: Hospital Recommended Level of Care: Skilled Nursing Facility Prior Approval Number:    Date Approved/Denied:   PASRR Number: [id number]  Discharge Plan: SNF    Current Diagnoses: Patient Active Problem List   Diagnosis Date Noted   Acute proctitis 05/13/2023   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/13/2023   Acute renal failure superimposed on stage 3b chronic kidney disease (HCC) 05/13/2023   Elevated troponin 05/13/2023   Abdominal pain 05/13/2023   Hypocalcemia 05/13/2023   COPD (chronic obstructive pulmonary disease) (HCC) 02/18/2022   Influenza A 02/17/2022   Bilateral pneumonia 03/03/2020   Stage 3b chronic kidney disease (CKD) (HCC) 03/02/2020   Acute pyelonephritis 03/02/2020   PAD S/P AKA (above knee amputation), right (HCC) 03/02/2020   Sepsis (HCC) 03/02/2020   Pyelonephritis 03/02/2020   PVD (peripheral vascular disease) (HCC) 03/25/2019   Osteomyelitis of right foot (HCC) 03/25/2019   Frequency of urination and polyuria 03/25/2019   Atherosclerosis of native arteries of the extremities with ulceration (HCC) 03/14/2019   COVID-19 virus infection 03/14/2019   Sacral pressure sore 11/25/2018   Arterial hemorrhage 11/18/2018   Hypoglycemia associated with type 2 diabetes mellitus (HCC) 11/18/2018   General weakness 09/22/2016   Bilateral carpal tunnel syndrome 09/22/2016   Leukocytosis 07/30/2015   Chronic heart failure with preserved ejection fraction (HFpEF) (HCC) 09/22/2013   Urinary  incontinence 12/13/2012   Chronic idiopathic constipation 11/29/2012   Metabolic syndrome 11/29/2012   Spina bifida (HCC) 11/18/2012   CAD s/p CABG (coronary artery disease) 03/08/2009   Type 2 diabetes mellitus with diabetic nephropathy (HCC) 10/29/2008   Type 2 diabetes mellitus with stage 3 chronic kidney disease, without long-term current use of insulin (HCC) 10/29/2008   PTSD 10/29/2008   Essential hypertension 10/29/2008    Orientation RESPIRATION BLADDER Height & Weight     Self, Time, Situation, Place  O2 (Nasal Cannula 2 L) Continent Weight: 216 lb 0.8 oz (98 kg) Height:  5\' 8"  (172.7 cm)  BEHAVIORAL SYMPTOMS/MOOD NEUROLOGICAL BOWEL NUTRITION STATUS   (None)   Continent Diet (See discharge recommendations once available. Currently on full liquids.)  AMBULATORY STATUS COMMUNICATION OF NEEDS Skin     Verbally PU Stage and Appropriate Care   PU Stage 2 Dressing:  (Right and left buttocks, sacrum: Foam and barrier cream.)                   Personal Care Assistance Level of Assistance              Functional Limitations Info  Hearing, Sight, Speech Sight Info: Adequate Hearing Info: Adequate Speech Info: Adequate    SPECIAL CARE FACTORS FREQUENCY                       Contractures Contractures Info: Not present    Additional Factors Info  Code Status, Allergies Code Status Info: DNR Allergies Info: Sulfonamide Derivatives, Aspirin, Nitrofurantoin, Penicillins, Sulfa Antibiotics           Current Medications (05/14/2023):  This is the current hospital active medication list Current Facility-Administered Medications  Medication Dose Route Frequency Provider Last Rate Last Admin   0.9 %  sodium chloride infusion   Intravenous Continuous Wendee Beavers, NP       acetaminophen (TYLENOL) tablet 650 mg  650 mg Oral Q6H PRN Andris Baumann, MD       Or   acetaminophen (TYLENOL) suppository 650 mg  650 mg Rectal Q6H PRN Andris Baumann, MD        albuterol (PROVENTIL) (2.5 MG/3ML) 0.083% nebulizer solution 2.5 mg  2.5 mg Nebulization Q2H PRN Andris Baumann, MD       aspirin EC tablet 81 mg  81 mg Oral Daily Lindajo Royal V, MD   81 mg at 05/14/23 0849   fluticasone furoate-vilanterol (BREO ELLIPTA) 100-25 MCG/ACT 1 puff  1 puff Inhalation Daily Andris Baumann, MD   1 puff at 05/14/23 0848   And   umeclidinium bromide (INCRUSE ELLIPTA) 62.5 MCG/ACT 1 puff  1 puff Inhalation Daily Andris Baumann, MD   1 puff at 05/14/23 0848   HYDROcodone-acetaminophen (NORCO/VICODIN) 5-325 MG per tablet 1-2 tablet  1-2 tablet Oral Q4H PRN Andris Baumann, MD       insulin aspart (novoLOG) injection 0-15 Units  0-15 Units Subcutaneous Q4H Lindajo Royal V, MD   3 Units at 05/14/23 1235   lactulose (CHRONULAC) 10 GM/15ML solution 20 g  20 g Oral Q M,W,F Lindajo Royal V, MD   20 g at 05/13/23 0919   metoprolol tartrate (LOPRESSOR) tablet 12.5 mg  12.5 mg Oral BID Lindajo Royal V, MD   12.5 mg at 05/14/23 0849   morphine (PF) 2 MG/ML injection 2 mg  2 mg Intravenous Q2H PRN Andris Baumann, MD       ondansetron [facility]) tablet 4 mg  4 mg Oral Q6H PRN Andris Baumann, MD       Or   ondansetron [facility]) injection 4 mg  4 mg Intravenous Q6H PRN Andris Baumann, MD       pantoprazole (PROTONIX) EC tablet 40 mg  40 mg Oral Daily Lindajo Royal V, MD   40 mg at 05/14/23 0849   piperacillin-tazobactam (ZOSYN) IVPB 3.375 g  3.375 g Intravenous Q8H Marcelino Duster, MD   Stopped at 05/14/23 1315   rosuvastatin (CRESTOR) tablet 10 mg  10 mg Oral QHS Andris Baumann, MD   10 mg at 05/13/23 2345   senna-docusate (Senokot-S) tablet 1 tablet  1 tablet Oral QHS PRN Andris Baumann, MD         Discharge Medications: Please see discharge summary for a list of discharge medications.  Relevant Imaging Results:  Relevant Lab Results:   Additional Information SS#: [id number]  Margarito Liner, LCSW

## 2023-05-14 NOTE — Progress Notes (Signed)
 Rounding Note    Patient Name: [name]. Date of Encounter: 05/14/2023  [facility] Cardiologist: New to [facility]  Subjective   Reports having 2 bowel movements yesterday, " small" Less abdominal pain today, very mild when he palpates his abdomen Denies significant shortness of breath Eating breakfast, liquid diet No significant arrhythmia on telemetry  Inpatient Medications    Scheduled Meds:  aspirin EC  81 mg Oral Daily   fluticasone furoate-vilanterol  1 puff Inhalation Daily   And   umeclidinium bromide  1 puff Inhalation Daily   insulin aspart  0-15 Units Subcutaneous Q4H   lactulose  20 g Oral Q M,W,F   metoprolol tartrate  12.5 mg Oral BID   pantoprazole  40 mg Oral Daily   rosuvastatin  10 mg Oral QHS   Continuous Infusions:  calcium gluconate     heparin 1,300 Units/hr (05/14/23 0247)   piperacillin-tazobactam (ZOSYN)  IV 3.375 g (05/14/23 0848)   PRN Meds: acetaminophen **OR** acetaminophen, albuterol, HYDROcodone-acetaminophen, morphine injection, ondansetron **OR** ondansetron (ZOFRAN) IV, senna-docusate   Vital Signs    Vitals:   05/14/23 0600 05/14/23 0630 05/14/23 0700 05/14/23 0800  BP: 117/63 (!) 117/58 118/60 127/61  Pulse: 69 62 71 74  Resp: (!) 26 20 (!) 23   Temp: 97.9 F (36.6 C)     TempSrc: Oral     SpO2: 96% 100% 100% 100%  Weight:      Height:        Intake/Output Summary (Last 24 hours) at 05/14/2023 1156 Last data filed at 05/14/2023 0457 Gross per 24 hour  Intake 1154.77 ml  Output --  Net 1154.77 ml      05/12/2023    3:13 PM 04/28/2023   12:16 AM 02/06/2023   11:32 PM  Last 3 Weights  Weight (lbs) 216 lb 0.8 oz 218 lb 217 lb  Weight (kg) 98 kg 98.884 kg 98.431 kg      Telemetry    Normal sinus rhythm- Personally Reviewed  ECG     - Personally Reviewed  Physical Exam   GEN: No acute distress.   Neck: No JVD Cardiac: RRR, no murmurs, rubs, or gallops.  Respiratory: Clear to auscultation  bilaterally. GI: Soft, nontender, non-distended  MS: No edema; No deformity. Neuro:  Nonfocal  Psych: Normal affect   Labs    High Sensitivity Troponin:   Recent Labs  Lab 05/12/23 2346 05/13/23 0151 05/13/23 0955 05/13/23 1300  TROPONINIHS 494* 443* 468* 455*     Chemistry Recent Labs  Lab 05/12/23 1541 05/13/23 0151 05/13/23 0617 05/14/23 0750  NA 138 137  --  137  K 4.0 3.8  --  3.8  CL 101 104  --  107  CO2 21* 24  --  21*  GLUCOSE 116* 135*  --  118*  BUN 39* 40*  --  47*  CREATININE 2.24* 2.15*  --  2.65*  CALCIUM 6.6* 6.0*  --  6.2*  MG  --   --  2.2  --   PROT 5.8* 5.6*  --  5.4*  ALBUMIN 3.0* 2.8*  --  2.6*  AST 13* 13*  --  13*  ALT 9 10  --  10  ALKPHOS 56 62  --  50  BILITOT 0.6 1.0  --  0.9  GFRNONAA 27* 29*  --  22*  ANIONGAP 16* 9  --  9    Lipids No results for input(s): "CHOL", "TRIG", "HDL", "LABVLDL", "LDLCALC", "  CHOLHDL" in the last 168 hours.  Hematology Recent Labs  Lab 05/12/23 1541 05/13/23 0617 05/14/23 0750  WBC 19.0* 17.2* 16.6*  RBC 4.01* 3.82* 3.48*  HGB 11.7* 11.1* 10.6*  HCT 37.3* 34.5* 33.2*  MCV 93.0 90.3 95.4  MCH 29.2 29.1 30.5  MCHC 31.4 32.2 31.9  RDW 15.3 15.5 15.7*  PLT 177 172 147*   Thyroid No results for input(s): "TSH", "FREET4" in the last 168 hours.  BNPNo results for input(s): "BNP", "PROBNP" in the last 168 hours.  DDimer No results for input(s): "DDIMER" in the last 168 hours.   Radiology    US SCROTUM W/DOPPLER Result Date: 05/13/2023 CLINICAL DATA:  Scrotal pain and swelling, left greater than right EXAM: SCROTAL ULTRASOUND DOPPLER ULTRASOUND OF THE TESTICLES TECHNIQUE: Complete ultrasound examination of the testicles, epididymis, and other scrotal structures was performed. Color and spectral Doppler ultrasound were also utilized to evaluate blood flow to the testicles. COMPARISON:  05/12/2023 FINDINGS: Right testicle Measurements: 3.3 x 1.7 x 2.4 cm. No mass or microlithiasis visualized. Left testicle  Measurements: 2.6 x 1.4 x 2.4 cm. No mass or microlithiasis visualized. Right epididymis: Heterogeneous appearance without focal abnormality or enlargement. Left epididymis: Heterogeneous appearance with increased vascularity on color Doppler imaging. Incidental 3 mm epididymal cyst of doubtful clinical significance. Hydrocele:  Trace right hydrocele. Varicocele:  None visualized. Pulsed Doppler interrogation of both testes demonstrates normal low resistance arterial and venous waveforms bilaterally. IMPRESSION: 1. Heterogeneous appearance of the left epididymis with increased vascularity which could reflect epididymitis. 2. Trace right hydrocele. 3. Otherwise unremarkable testicular ultrasound. Electronically Signed   By: Sharlet Salina M.D.   On: 05/13/2023 18:06   CT ABDOMEN PELVIS WO CONTRAST Result Date: 05/12/2023 CLINICAL DATA:  Abdominal pain for 2 days. Bowel obstruction suspected EXAM: CT ABDOMEN AND PELVIS WITHOUT CONTRAST TECHNIQUE: Multidetector CT imaging of the abdomen and pelvis was performed following the standard protocol without IV contrast. RADIATION DOSE REDUCTION: This exam was performed according to the departmental dose-optimization program which includes automated exposure control, adjustment of the mA and/or kV according to patient size and/or use of iterative reconstruction technique. COMPARISON:  CT abdomen pelvis 04/28/2023 FINDINGS: Lower chest: Bibasilar atelectasis/scarring.  No acute abnormality. Hepatobiliary: No acute abnormality. Pancreas: Fatty atrophy.  No acute abnormality. Spleen: Unremarkable. Adrenals/Urinary Tract: Normal adrenal glands. No urinary calculi or hydronephrosis. Unchanged nonspecific perinephric stranding. Thickening of the nondistended bladder. Stomach/Bowel: Stomach is within normal limits. No bowel obstruction. Moderate wall thickening about the rectum. Marked adjacent stranding and trace free fluid. There is gas within the left rectal/anal wall extending  through the subcutaneous fat along the left gluteal cleft compatible with anocutaneous fistula. Vascular/Lymphatic: Aortic atherosclerosis. No enlarged abdominal or pelvic lymph nodes. Reproductive: Prostatectomy. Other: No free intraperitoneal air.  No abscess. Musculoskeletal: No acute fracture. IMPRESSION: 1. Proctitis with left-sided anocutaneous fistula. No abscess. 2. Thickening of the nondistended bladder. Correlate with urinalysis to exclude cystitis. 3. Aortic Atherosclerosis (ICD10-I70.0). Electronically Signed   By: Minerva Fester M.D.   On: 05/12/2023 19:59    Cardiac Studies   Echo completed, Prelim: Normal LV function EF greater than 55% Normal RV size and function  Patient Profile     Mr. Chares, Slaymaker. is a 89 year old gentleman with history of coronary artery disease, history of CABG, COPD, chronic diastolic CHF, chronic kidney disease stage III, PAD status post above-knee amputation, diabetes type 2 presenting with abdominal pain, noted to have elevated troponins   Assessment & Plan  Elevated troponin -Secondary to supply/demand mismatch, demand ischemia from abdominal pain, known coronary artery disease history of CABG In the setting of abdominal pain, constipation, likely supply/demand mismatch Troponins trending in the 400 range -At no point during his hospital course that he suggest symptoms concerning for angina, no shortness of breath, no chest pain -Echo with normal EF, no focal wall motion abnormality Continue aspirin, Lopressor 12.5 twice daily, Crestor No plan for ischemic workup at this time   Abdominal pain CT with proctitis, peritoneal fistula, possible cystitis Evaluated by CT surgery, concern for constipation He has had several bowel movements yesterday, likely has several more to go   chronic renal failure Creatinine of 2 slightly above baseline Received 1 L lactated Ringer's in the ER Remains on IV fluids at low rate  Creatinine 2.65 up from  baseline 1.76   [facility] will sign off.   Medication Recommendations: No further medication changes Other recommendations (labs, testing, etc): No further testing at this time Follow up as an outpatient: Outpatient follow-up in cardiology clinic, will need to get him established in the [facility] office   For questions or updates, please contact [facility] Please consult [email or web address] for contact info under        Signed, Julien Nordmann, MD  05/14/2023, 11:56 AM

## 2023-05-14 NOTE — ED Notes (Signed)
 Per pharmacist Harrold Donath, this RN was advised to hold off on restarting heparin at 10 mL/hr until heparin level obtained. Heparin level blood draw scheduled to be collected at 0100.

## 2023-05-14 NOTE — Progress Notes (Signed)
 PHARMACY - ANTICOAGULATION CONSULT NOTE  Pharmacy Consult for Heparin Infusion Indication: chest pain/ACS  Allergies  Allergen Reactions   Sulfonamide Derivatives     REACTION: pruitis patient cant remember its been so long     Aspirin Rash    In high doses   Nitrofurantoin Rash   Penicillins Rash    Did it involve swelling of the face/tongue/throat, SOB, or low BP? No Did it involve sudden or severe rash/hives, skin peeling, or any reaction on the inside of your mouth or nose? No Did you need to seek medical attention at a hospital or doctor's office? No When did it last happen?      Teenager If all above answers are "NO", may proceed with cephalosporin use.   Sulfa Antibiotics Rash    Patient Measurements: Height: 5\' 8"  (172.7 cm) Weight: 98 kg (216 lb 0.8 oz) IBW/kg (Calculated) : 68.4 Heparin Dosing Weight: 89.2 kg  Vital Signs: Temp: 98 F (36.7 C) (03/20 0100) Temp Source: Oral (03/20 0100) BP: 122/55 (03/20 0200) Pulse Rate: 77 (03/20 0200)  Labs: Recent Labs    05/12/23 1541 05/12/23 2346 05/13/23 0151 05/13/23 0617 05/13/23 0955 05/13/23 1300 05/13/23 1738 05/14/23 0112  HGB 11.7*  --   --  11.1*  --   --   --   --   HCT 37.3*  --   --  34.5*  --   --   --   --   PLT 177  --   --  172  --   --   --   --   APTT  --   --   --   --   --   --  33  --   LABPROT  --   --   --   --   --   --  16.7*  --   INR  --   --   --   --   --   --  1.3*  --   HEPARINUNFRC  --   --   --   --   --   --   --  <0.10*  CREATININE 2.24*  --  2.15*  --   --   --   --   --   TROPONINIHS  --    < > 443*  --  468* 455*  --   --    < > = values in this interval not displayed.    Estimated Creatinine Clearance: 25.9 mL/min (A) (by C-G formula based on SCr of 2.15 mg/dL (H)).   Medical History: Past Medical History:  Diagnosis Date   Agent orange exposure 1966 or 1971   Anemia    Anxiety    Arthritis    "hands and back" (01/20/2013)   CAD (coronary artery disease)     native vessel   Carotid artery disease (HCC)    nonobstructive   Cataract    Cecal diverticulitis 2008   drained   Cellulitis, gluteal    bilateral for the past 6 months/notes 01/20/2013   Chronic lower back pain    Chronic renal insufficiency    Depression    Diabetes mellitus type II    Exertional shortness of breath    HTN (hypertension)    Hyperlipidemia    Hypokalemia    Malnutrition (HCC)    protein-calorie   Myocardial infarction (HCC)    "silent; before OHS" (01/20/2013)   PTSD (post-traumatic stress disorder)    Spina bifida (  HCC)    Urinary incontinence     Medications:  Not on anticoagulation at home per chart review  Assessment: Patient is a 89 year old male with a past medical history of CAD, hx of CABG, COPD, chronic diastolic CHF, CKD stage III, PAD s/p AKA, and T2DM who presented to ED for abdominal pain and was found to have elevated troponin level. Troponin  [id number]. Cardiology thinks this could supply/demand mismatch in the setting of abdominal pain/constipation and plan to start heparin infusion until echo results return.  Baseline INR and aPTT ordered.  No signs/symptoms of bleeding noted in chart. Hgb 11.1. PLT 172.  Goal of Therapy:  Heparin level 0.3-0.7 units/ml Monitor platelets by anticoagulation protocol: Yes  03/20 0129 HL < 0.1, subtherapeutic   Plan:  Give 2700 unit bolus x 1 Start heparin infusion at a rate of 1300 units/hr Recheck heparin level in 8 hours after rate change Monitor CBC daily while on heparin  Otelia Sergeant, PharmD, [facility] 05/14/2023 2:37 AM

## 2023-05-15 DIAGNOSIS — K6289 Other specified diseases of anus and rectum: Secondary | ICD-10-CM | POA: Diagnosis not present

## 2023-05-15 DIAGNOSIS — R1084 Generalized abdominal pain: Secondary | ICD-10-CM | POA: Diagnosis not present

## 2023-05-15 DIAGNOSIS — I5032 Chronic diastolic (congestive) heart failure: Secondary | ICD-10-CM | POA: Diagnosis not present

## 2023-05-15 DIAGNOSIS — K603 Anal fistula, unspecified: Secondary | ICD-10-CM | POA: Diagnosis not present

## 2023-05-15 LAB — CBC
HCT: 29.9 % — ABNORMAL LOW (ref 39.0–52.0)
Hemoglobin: 9.9 g/dL — ABNORMAL LOW (ref 13.0–17.0)
MCH: 30.8 pg (ref 26.0–34.0)
MCHC: 33.1 g/dL (ref 30.0–36.0)
MCV: 93.1 fL (ref 80.0–100.0)
Platelets: 163 10*3/uL (ref 150–400)
RBC: 3.21 MIL/uL — ABNORMAL LOW (ref 4.22–5.81)
RDW: 15.2 % (ref 11.5–15.5)
WBC: 12.6 10*3/uL — ABNORMAL HIGH (ref 4.0–10.5)
nRBC: 0 % (ref 0.0–0.2)

## 2023-05-15 LAB — BASIC METABOLIC PANEL
Anion gap: 11 (ref 5–15)
BUN: 47 mg/dL — ABNORMAL HIGH (ref 8–23)
CO2: 21 mmol/L — ABNORMAL LOW (ref 22–32)
Calcium: 6.8 mg/dL — ABNORMAL LOW (ref 8.9–10.3)
Chloride: 104 mmol/L (ref 98–111)
Creatinine, Ser: 2.65 mg/dL — ABNORMAL HIGH (ref 0.61–1.24)
GFR, Estimated: 22 mL/min — ABNORMAL LOW (ref >60)
Glucose, Bld: 126 mg/dL — ABNORMAL HIGH (ref 70–99)
Potassium: 3.3 mmol/L — ABNORMAL LOW (ref 3.5–5.1)
Sodium: 136 mmol/L (ref 135–145)

## 2023-05-15 LAB — GLUCOSE, CAPILLARY
Glucose-Capillary: 107 mg/dL — ABNORMAL HIGH (ref 70–99)
Glucose-Capillary: 123 mg/dL — ABNORMAL HIGH (ref 70–99)
Glucose-Capillary: 166 mg/dL — ABNORMAL HIGH (ref 70–99)
Glucose-Capillary: 135 mg/dL — ABNORMAL HIGH (ref 70–99)

## 2023-05-15 LAB — PARATHYROID HORMONE, INTACT (NO CA)
PTH: 248 pg/mL — ABNORMAL HIGH (ref 15–65)
PTH: 177 pg/mL — ABNORMAL HIGH (ref 15–65)

## 2023-05-15 LAB — URINE CULTURE: Culture: >=100000 — AB

## 2023-05-15 MED ORDER — OYSTER SHELL CALCIUM/D3 500-5 MG-MCG PO TABS
1.0000 | ORAL_TABLET | Freq: Two times a day (BID) | ORAL | 1 refills | Status: AC
Start: 2023-05-15 — End: ?

## 2023-05-15 MED ORDER — CIPROFLOXACIN HCL 500 MG PO TABS: 500.0000 mg | ORAL_TABLET | Freq: Two times a day (BID) | ORAL | 0 refills | Status: AC

## 2023-05-15 MED ORDER — METRONIDAZOLE 500 MG PO TABS
500.0000 mg | ORAL_TABLET | Freq: Two times a day (BID) | ORAL | 0 refills | Status: AC
Start: 2023-05-15 — End: 2023-05-20

## 2023-05-15 NOTE — Progress Notes (Signed)
 Report called to [facility] [employer], spoke with reggie harris lpn, all questions answered, will continue to monitor.

## 2023-05-15 NOTE — Progress Notes (Signed)
 Patient ID: [name]., male   DOB: [birth year], 89 y.o.   MRN: [id number]     SURGICAL PROGRESS NOTE   Hospital Day(s): 2.   Interval History: Patient seen and examined, no acute events or new complaints overnight. Patient reports feeling great.  He endorses that he feels great because he had 3 good bowel movement yesterday.  He endorses that the abdominal pain has resolved after bowel movement.  He denies any nausea or vomiting.  He is tolerating diet.  He denies any fever.  Vital signs in last 24 hours: [min-max] current  Temp:  [97.5 F (36.4 C)-98.2 F (36.8 C)] 98.1 F (36.7 C) (03/21 0940) Pulse Rate:  [69-80] 76 (03/21 0940) Resp:  [20-22] 20 (03/21 0347) BP: (100-125)/(33-64) 125/54 (03/21 0940) SpO2:  [93 %-100 %] 95 % (03/21 0940)     Height: 5\' 8"  (172.7 cm) Weight: 98 kg BMI (Calculated): 32.86   Physical Exam:  Constitutional: alert, cooperative and no distress  Respiratory: breathing non-labored at rest  Cardiovascular: regular rate and sinus rhythm  Gastrointestinal: soft, non-tender, and non-distended Rectal: Rectal exam with decreased sphincter tone with incontinence.  Suspected enterocutaneous fistula on the left and I will area.  No sign of abscess or cellulitis.  Labs:     Latest Ref Rng & Units 05/15/2023    4:25 AM 05/14/2023    7:50 AM 05/13/2023    6:17 AM  CBC  WBC 4.0 - 10.5 K/uL 12.6  16.6  17.2   Hemoglobin 13.0 - 17.0 g/dL 9.9  40.9  81.1   Hematocrit 39.0 - 52.0 % 29.9  33.2  34.5   Platelets 150 - 400 K/uL 163  147  172       Latest Ref Rng & Units 05/15/2023    4:25 AM 05/14/2023    7:50 AM 05/13/2023    1:51 AM  CMP  Glucose 70 - 99 mg/dL 914  782  956   BUN 8 - 23 mg/dL 47  47  40   Creatinine 0.61 - 1.24 mg/dL 2.13  0.86  5.78   Sodium 135 - 145 mmol/L 136  137  137   Potassium 3.5 - 5.1 mmol/L 3.3  3.8  3.8   Chloride 98 - 111 mmol/L 104  107  104   CO2 22 - 32 mmol/L 21  21  24    Calcium 8.9 - 10.3 mg/dL 6.8  6.2  6.0   Total  Protein 6.5 - 8.1 g/dL  5.4  5.6   Total Bilirubin 0.0 - 1.2 mg/dL  0.9  1.0   Alkaline Phos 38 - 126 U/L  50  62   AST 15 - 41 U/L  13  13   ALT 0 - 44 U/L  10  10     Imaging studies: No new pertinent imaging studies   Assessment/Plan:  89 y.o. male with proctitis with perianal fistula, complicated by pertinent comorbidities including spina bifida, coronary artery disease, hypertension, chronic kidney disease, carotid artery disease.   -Patient responding well to antibiotic therapy and management of constipation -White blood cell count adequately decreasing trend -No surgical intervention for proctitis or the perianal fistula.  Continue medical management as per primary team -Patient will benefit of outpatient referral to colorectal surgeon for further follow-up of perianal fistula -No contraindication to discharge from surgical standpoint once medically stable.  Gae Gallop, MD

## 2023-05-15 NOTE — Progress Notes (Signed)
 [facility]  ROUNDING NOTE   Subjective:   Patient seen sitting up in bed Alert and oriented Tolerating small meals  Creatinine 2.65  Objective:  Vital signs in last 24 hours:  Temp:  [97.5 F (36.4 C)-98.2 F (36.8 C)] 98.1 F (36.7 C) (03/21 0940) Pulse Rate:  [73-80] 76 (03/21 0940) Resp:  [20] 20 (03/21 0347) BP: (100-125)/(33-58) 125/54 (03/21 0940) SpO2:  [93 %-100 %] 95 % (03/21 0940)  Weight change:  Filed Weights   05/12/23 1513  Weight: 98 kg    Intake/Output: I/O last 3 completed shifts: In: 1440 [I.V.:1190; IV Piggyback:250] Out: 400 [Urine:400]   Intake/Output this shift:  No intake/output data recorded.  Physical Exam: General: NAD  Head: Normocephalic, atraumatic. Moist oral mucosal membranes  Eyes: Anicteric  Lungs:  Basilar rales with wheezing  Heart: Regular rate and rhythm  Abdomen:  Soft, nontender,   Extremities: No peripheral edema.  Right AKA  Neurologic: Alert, moving all four extremities  Skin: No lesions  Access: None    Basic Metabolic Panel: Recent Labs  Lab 05/12/23 1541 05/13/23 0151 05/13/23 0617 05/14/23 0750 05/15/23 0425  NA 138 137  --  137 136  K 4.0 3.8  --  3.8 3.3*  CL 101 104  --  107 104  CO2 21* 24  --  21* 21*  GLUCOSE 116* 135*  --  118* 126*  BUN 39* 40*  --  47* 47*  CREATININE 2.24* 2.15*  --  2.65* 2.65*  CALCIUM 6.6* 6.0*  --  6.2* 6.8*  MG  --   --  2.2  --   --     Liver Function Tests: Recent Labs  Lab 05/12/23 1541 05/13/23 0151 05/14/23 0750  AST 13* 13* 13*  ALT 9 10 10   ALKPHOS 56 62 50  BILITOT 0.6 1.0 0.9  PROT 5.8* 5.6* 5.4*  ALBUMIN 3.0* 2.8* 2.6*   Recent Labs  Lab 05/12/23 1541  LIPASE 20   No results for input(s): "AMMONIA" in the last 168 hours.  CBC: Recent Labs  Lab 05/12/23 1541 05/13/23 0617 05/14/23 0750 05/15/23 0425  WBC 19.0* 17.2* 16.6* 12.6*  HGB 11.7* 11.1* 10.6* 9.9*  HCT 37.3* 34.5* 33.2* 29.9*  MCV 93.0 90.3 95.4 93.1  PLT 177  172 147* 163    Cardiac Enzymes: No results for input(s): "CKTOTAL", "CKMB", "CKMBINDEX", "TROPONINI" in the last 168 hours.  BNP: Invalid input(s): "POCBNP"  CBG: Recent Labs  Lab 05/14/23 1634 05/14/23 2003 05/14/23 2325 05/15/23 0343 05/15/23 0852  GLUCAP 157* 99 85 107* 123*    Microbiology: Results for orders placed or performed during the hospital encounter of 05/12/23  Blood culture (routine x 2)     Status: None (Preliminary result)   Collection Time: 05/12/23 11:35 PM   Specimen: BLOOD  Result Value Ref Range Status   Specimen Description BLOOD LAC  Final   Special Requests   Final    BOTTLES DRAWN AEROBIC AND ANAEROBIC Blood Culture results may not be optimal due to an inadequate volume of blood received in culture bottles   Culture   Final    NO GROWTH 3 DAYS Performed at [facility], [address] [place], [place] [address]    Report Status PENDING  Incomplete  Blood culture (routine x 2)     Status: None (Preliminary result)   Collection Time: 05/12/23 11:46 PM   Specimen: BLOOD  Result Value Ref Range Status   Specimen Description BLOOD  RW`  Final   Special Requests   Final    BOTTLES DRAWN AEROBIC AND ANAEROBIC Blood Culture adequate volume   Culture   Final    NO GROWTH 3 DAYS Performed at [facility], [address] [place], [place] [address]    Report Status PENDING  Incomplete  Urine Culture     Status: Abnormal   Collection Time: 05/13/23 12:35 AM   Specimen: Urine, Random  Result Value Ref Range Status   Specimen Description   Final    URINE, RANDOM Performed at [facility], [address] [place], [place] [address]    Special Requests   Final    NONE Reflexed from [phone] Performed at [facility], [address] [place], [place] [address]    Culture (A)  Final    >=100,000 COLONIES/mL PROTEUS MIRABILIS 30,000 COLONIES/mL AEROCOCCUS SPECIES Standardized susceptibility testing for this  organism is not available. Performed at [facility], [address] [place], [place] [address]    Report Status 05/15/2023 FINAL  Final   Organism ID, Bacteria PROTEUS MIRABILIS (A)  Final      Susceptibility   Proteus mirabilis - MIC*    AMPICILLIN 8 SENSITIVE Sensitive     CEFAZOLIN <=4 SENSITIVE Sensitive     CEFEPIME <=0.12 SENSITIVE Sensitive     CEFTRIAXONE <=0.25 SENSITIVE Sensitive     CIPROFLOXACIN <=0.25 SENSITIVE Sensitive     GENTAMICIN <=1 SENSITIVE Sensitive     IMIPENEM 8 INTERMEDIATE Intermediate     NITROFURANTOIN 128 RESISTANT Resistant     TRIMETH/SULFA >=320 RESISTANT Resistant     AMPICILLIN/SULBACTAM <=2 SENSITIVE Sensitive     PIP/TAZO <=4 SENSITIVE Sensitive ug/mL    * >=100,000 COLONIES/mL PROTEUS MIRABILIS    Coagulation Studies: Recent Labs    05/13/23 1738  LABPROT 16.7*  INR 1.3*    Urinalysis: Recent Labs    05/13/23 0035  COLORURINE YELLOW*  LABSPEC 1.013  PHURINE 5.0  GLUCOSEU NEGATIVE  HGBUR SMALL*  BILIRUBINUR NEGATIVE  KETONESUR NEGATIVE  PROTEINUR 100*  NITRITE NEGATIVE  LEUKOCYTESUR LARGE*      Imaging: ECHOCARDIOGRAM COMPLETE Result Date: 05/14/2023    ECHOCARDIOGRAM REPORT   Patient Name:   [name]. Date of Exam: 05/14/2023 Medical Rec #:  [id number]           Height:       68.0 in Accession #:    [id number]          Weight:       216.0 lb Date of Birth:  [birth year]           BSA:          2.112 m Patient Age:    89 years            BP:           127/61 mmHg Patient Gender: M                   HR:           71 bpm. Exam Location:  [facility] Procedure: 2D Echo, Cardiac Doppler, Color Doppler and Intracardiac            Opacification Agent (Both Spectral and Color Flow Doppler were            utilized during procedure). Indications:     Elevated Troponin  History:         Patient has prior history of Echocardiogram  examinations, most                  recent 03/16/2013. CHF, CAD, Prior CABG, COPD; Risk                   Factors:Hypertension and Diabetes. CKD.  Sonographer:     Mikki Harbor Referring Phys:  [id number] Iba Diagnosing Phys: Julien Nordmann MD  Sonographer Comments: Technically difficult study due to poor echo windows, no subcostal window and patient is obese. IMPRESSIONS  1. Left ventricular ejection fraction, by estimation, is 55 to 60%. The left ventricle has normal function. The left ventricle has no regional wall motion abnormalities. There is mild left ventricular hypertrophy. Left ventricular diastolic parameters are indeterminate.  2. Right ventricular systolic function is normal. The right ventricular size is normal.  3. The mitral valve is normal in structure. Mild mitral valve regurgitation. No evidence of mitral stenosis.  4. The aortic valve is normal in structure. There is mild calcification of the aortic valve. Aortic valve regurgitation is not visualized. Aortic valve sclerosis is present, with no evidence of aortic valve stenosis.  5. The inferior vena cava is normal in size with greater than 50% respiratory variability, suggesting right atrial pressure of 3 mmHg. FINDINGS  Left Ventricle: Left ventricular ejection fraction, by estimation, is 55 to 60%. The left ventricle has normal function. The left ventricle has no regional wall motion abnormalities. Definity contrast agent was given IV to delineate the left ventricular  endocardial borders. Strain was performed and the global longitudinal strain is indeterminate. The left ventricular internal cavity size was normal in size. There is mild left ventricular hypertrophy. Left ventricular diastolic parameters are indeterminate. Right Ventricle: The right ventricular size is normal. No increase in right ventricular wall thickness. Right ventricular systolic function is normal. Left Atrium: Left atrial size was normal in size. Right Atrium: Right atrial size was normal in size. Pericardium: There is no evidence of pericardial effusion. Mitral  Valve: The mitral valve is normal in structure. Mild mitral valve regurgitation. No evidence of mitral valve stenosis. MV peak gradient, 4.0 mmHg. The mean mitral valve gradient is 1.0 mmHg. Tricuspid Valve: The tricuspid valve is normal in structure. Tricuspid valve regurgitation is not demonstrated. No evidence of tricuspid stenosis. Aortic Valve: The aortic valve is normal in structure. There is mild calcification of the aortic valve. Aortic valve regurgitation is not visualized. Aortic valve sclerosis is present, with no evidence of aortic valve stenosis. Aortic valve mean gradient  measures 2.0 mmHg. Aortic valve peak gradient measures 4.8 mmHg. Aortic valve area, by VTI measures 2.61 cm. Pulmonic Valve: The pulmonic valve was normal in structure. Pulmonic valve regurgitation is not visualized. No evidence of pulmonic stenosis. Aorta: The aortic root is normal in size and structure. Venous: The inferior vena cava is normal in size with greater than 50% respiratory variability, suggesting right atrial pressure of 3 mmHg. IAS/Shunts: No atrial level shunt detected by color flow Doppler. Additional Comments: 3D was performed not requiring image post processing on an independent workstation and was indeterminate.  LEFT VENTRICLE PLAX 2D LVIDd:         4.80 cm   Diastology LVIDs:         2.80 cm   LV e' medial:    4.50 cm/s LV PW:         1.50 cm   LV E/e' medial:  15.8 LV IVS:        1.20 cm  LV e' lateral:   9.62 cm/s LVOT diam:     2.00 cm   LV E/e' lateral: 7.4 LV SV:         57 LV SV Index:   27 LVOT Area:     3.14 cm  RIGHT VENTRICLE RV Basal diam:  3.80 cm RV Mid diam:    3.50 cm RV S prime:     12.30 cm/s LEFT ATRIUM           Index        RIGHT ATRIUM           Index LA diam:      4.50 cm 2.13 cm/m   RA Area:     18.10 cm LA Vol (A4C): 38.8 ml 18.37 ml/m  RA Volume:   49.20 ml  23.30 ml/m  AORTIC VALVE                    PULMONIC VALVE AV Area (Vmax):    2.36 cm     PV Vmax:       0.80 m/s AV Area  (Vmean):   2.13 cm     PV Peak grad:  2.6 mmHg AV Area (VTI):     2.61 cm AV Vmax:           110.00 cm/s AV Vmean:          67.900 cm/s AV VTI:            0.217 m AV Peak Grad:      4.8 mmHg AV Mean Grad:      2.0 mmHg LVOT Vmax:         82.60 cm/s LVOT Vmean:        46.100 cm/s LVOT VTI:          0.180 m LVOT/AV VTI ratio: 0.83  AORTA Ao Root diam: 3.90 cm Ao Asc diam:  3.60 cm MITRAL VALVE MV Area (PHT): 3.99 cm    SHUNTS MV Area VTI:   1.53 cm    Systemic VTI:  0.18 m MV Peak grad:  4.0 mmHg    Systemic Diam: 2.00 cm MV Mean grad:  1.0 mmHg MV Vmax:       1.00 m/s MV Vmean:      54.5 cm/s MV Decel Time: 190 msec MV E velocity: 71.00 cm/s MV A velocity: 79.50 cm/s MV E/A ratio:  0.89 Julien Nordmann MD Electronically signed by Julien Nordmann MD Signature Date/Time: 05/14/2023/3:34:47 PM    Final    US SCROTUM W/DOPPLER Result Date: 05/13/2023 CLINICAL DATA:  Scrotal pain and swelling, left greater than right EXAM: SCROTAL ULTRASOUND DOPPLER ULTRASOUND OF THE TESTICLES TECHNIQUE: Complete ultrasound examination of the testicles, epididymis, and other scrotal structures was performed. Color and spectral Doppler ultrasound were also utilized to evaluate blood flow to the testicles. COMPARISON:  05/12/2023 FINDINGS: Right testicle Measurements: 3.3 x 1.7 x 2.4 cm. No mass or microlithiasis visualized. Left testicle Measurements: 2.6 x 1.4 x 2.4 cm. No mass or microlithiasis visualized. Right epididymis: Heterogeneous appearance without focal abnormality or enlargement. Left epididymis: Heterogeneous appearance with increased vascularity on color Doppler imaging. Incidental 3 mm epididymal cyst of doubtful clinical significance. Hydrocele:  Trace right hydrocele. Varicocele:  None visualized. Pulsed Doppler interrogation of both testes demonstrates normal low resistance arterial and venous waveforms bilaterally. IMPRESSION: 1. Heterogeneous appearance of the left epididymis with increased vascularity which could  reflect epididymitis. 2. Trace right hydrocele. 3. Otherwise unremarkable testicular ultrasound. Electronically  Signed   By: Sharlet Salina M.D.   On: 05/13/2023 18:06     Medications:    sodium chloride 40 mL/hr at 05/14/23 2341   piperacillin-tazobactam (ZOSYN)  IV 3.375 g (05/15/23 0935)    aspirin EC  81 mg Oral Daily   fluticasone furoate-vilanterol  1 puff Inhalation Daily   And   umeclidinium bromide  1 puff Inhalation Daily   insulin aspart  0-15 Units Subcutaneous Q4H   lactulose  20 g Oral Q M,W,F   metoprolol tartrate  12.5 mg Oral BID   pantoprazole  40 mg Oral Daily   rosuvastatin  10 mg Oral QHS   acetaminophen **OR** acetaminophen, albuterol, HYDROcodone-acetaminophen, morphine injection, ondansetron **OR** ondansetron (ZOFRAN) IV, senna-docusate  Assessment/ Plan:  [name]. is a 89 y.o.  male  with past medical conditions including CAD status post CABG, PAD status post right AKA a, COPD, type 2 diabetes, and chronic kidney disease stage IIIb, who was admitted to [facility] on 05/12/2023 for Generalized abdominal pain [R10.84] Acute proctitis [K62.89]   Acute kidney injury on chronic kidney disease stage IIIb. Baseline creatinine appears to be 1.76 on admission. Acute kidney injury likely secondary to infectious source. CT abdomen pelvis negative for obstruction but shows thickening bladder wall, concerning for cystitis. UA appears turbid with leukocytes and mild proteinuria. Creatinine has peaked at 2.65.  Creatinine currently stable today.  Questionable urine output noted.  Will need follow-up in our office at discharge to continue monitoring.  Lab Results  Component Value Date   CREATININE 2.65 (H) 05/15/2023   CREATININE 2.65 (H) 05/14/2023   CREATININE 2.15 (H) 05/13/2023    Intake/Output Summary (Last 24 hours) at 05/15/2023 1220 Last data filed at 05/15/2023 0900 Gross per 24 hour  Intake 285.2 ml  Output 400 ml  Net -114.8 ml   2. Hypocalcemia,  likely secondary to kidney injury.  Serum calcium have improved to 6.8.  IV supplementation per primary team.  Corrected calcium 7.9.   3. Anemia of chronic kidney disease Lab Results  Component Value Date   HGB 9.9 (L) 05/15/2023    Hemoglobin remains acceptable.  No need for ESA's at this time.  4.  Diabetes mellitus type II with chronic kidney disease/renal manifestations: insulin dependent. Home regimen includes Humalog, Lantus, and Januvia. Most recent hemoglobin A1c is 7.6 on 05/12/2023.   Primary team to manage sliding scale insulin.   LOS: 2 [name] 3/21/202512:20 PM

## 2023-05-15 NOTE — Discharge Summary (Addendum)
 Physician Discharge Summary   Patient: [name]. MRN: [id number] DOB: 05/17/1930  Admit date:     05/12/2023  Discharge date: 05/15/23  Discharge Physician: Marcelino Duster   [facility]   Recommendations at discharge:   PCP follow up in 1 week.  Need repeat BMP in 3 days. Outpatient referral to colorectal surgeon for further follow-up of perianal fistula. Nephrology follow up in 2-3 weeks.  Discharge Diagnoses: Principal Problem:   Abdominal pain Active Problems:   CAD s/p CABG (coronary artery disease)   Acute proctitis   Hypocalcemia   Type 2 diabetes mellitus with diabetic nephropathy (HCC)   Chronic idiopathic constipation   Chronic heart failure with preserved ejection fraction (HFpEF) (HCC)   PAD S/P AKA (above knee amputation), right (HCC)   COPD (chronic obstructive pulmonary disease) (HCC)   Anocutaneous fistula   Acute renal failure superimposed on stage 3b chronic kidney disease (HCC)   Elevated troponin   Obesity   Pressure injury of skin  Resolved Problems:   * No resolved hospital problems. [facility]: Thien Berka. is a 89 y.o. male with medical history significant for COPD, CAD s/p CABG (1989) and PAD s/p right AKA, CKD lllb, T2DM, who presents to the ED with a several hour history of abdominal pain with an x-ray at his facility concerning for small bowel obstruction.    Patient is started on IV Zosyn therapy for proctitis, general surgery evaluated the patient recommended no surgical intervention for anocutaneous fistula.  Cardiology evaluated the patient for elevated troponin advised echocardiogram, no inpatient ischemic workup.  Patient's calcium low, kidney function worsening.  Nephrology consulted who advised calcium replacement, outpatient follow up.  Patient's abdominal pain improved, white count better, able to tolerate diet and is hemodynamically stable to be discharged home.  I advised him to follow-up  with PCP, nephrology upon discharge.  Outpatient colorectal surgery evaluation suggested.  Patient understands and agrees with the discharge plan.  Assessment and Plan: * Abdominal pain Proctitis, perianal fistula Imaging showing proctitis and possible cystitis Patient is treated with Zosyn therapy, changed to Cipro and Flagyl upon discharge General Surgery followed the patient, no surgery intervention but advised outpatient colorectal surgery for left anocutaneous fistula management Patient able to tolerate diet, white count improved.   CAD s/p CABG (coronary artery disease) Elevated troponin Troponin elevation 494-->443 suspect secondary to demand ischemia as patient has no chest pain, EKG non acute  Continue aspirin, rosuvastatin, ezetimibe and metoprolol Cardiology eval treat him, advised no further workup. Echocardiogram showed EF 60%, no wall motion abnormalities.   Hypocalcemia Calcium low at 6, 6.8 when corrected for albumin of 2.8 PTH pending. Nephrology advised calcium replacement and outpatient follow-up He got IV calcium in the hospital, oral calcium plus vitamin D prescribed   Acute renal failure superimposed on stage 3b chronic kidney disease (HCC) He got IV fluids, kidney function stable Follow-up with nephrology as outpatient Monitor renal function with PCP. Avoid nephrotoxic drugs.   COPD (chronic obstructive pulmonary disease) (HCC) Not acutely exacerbated Continue home inhalers   PAD S/P AKA (above knee amputation), right (HCC) Continue aspirin, rosuvastatin, ezetimibe and metoprolol   Type 2 diabetes mellitus with diabetic nephropathy (HCC) Home insulin regimen resumed upon discharge.   Obesity class I BMI 32.85 Diet, exercise and weight reduction advised.         Consultants: General Surgery, nephrology Procedures performed: None Disposition: Skilled nursing facility Diet recommendation:  Discharge Diet Orders (  From admission, onward)      Start     Ordered   05/15/23 0000  Diet - low sodium heart healthy        05/15/23 1126   05/15/23 0000  Diet Carb Modified        05/15/23 1126           Cardiac and Carb modified diet DISCHARGE MEDICATION: Allergies as of 05/15/2023       Reactions   Sulfonamide Derivatives    REACTION: pruitis patient cant remember its been so long    Aspirin Rash   In high doses   Nitrofurantoin Rash   Penicillins Rash   Did it involve swelling of the face/tongue/throat, SOB, or low BP? No Did it involve sudden or severe rash/hives, skin peeling, or any reaction on the inside of your mouth or nose? No Did you need to seek medical attention at a hospital or doctor's office? No When did it last happen?      Teenager If all above answers are "NO", may proceed with cephalosporin use.   Sulfa Antibiotics Rash        Medication List     STOP taking these medications    12 Hour Nasal Decongestant 0.05 % nasal spray Generic drug: oxymetazoline   ezetimibe 10 MG tablet Commonly known as: ZETIA   Iodoflex 0.9 % Pads Generic drug: Cadexomer Iodine   melatonin 5 MG Tabs   Mucinex DM 30-600 MG Tb12   omeprazole 20 MG capsule Commonly known as: PRILOSEC       TAKE these medications    acetaminophen 325 MG tablet Commonly known as: TYLENOL Take 650 mg by mouth every 6 (six) hours as needed.   albuterol 108 (90 Base) MCG/ACT inhaler Commonly known as: VENTOLIN HFA Inhale 2 puffs into the lungs every 6 (six) hours as needed for wheezing or shortness of breath.   Artificial Tears 0.2-0.2-1 % Soln Generic drug: Glycerin-Hypromellose-PEG 400 Apply to eye 3 (three) times daily. Instill 2 drops into each eye 3 times daily.   Ashwagandha 300 MG Tabs Take 1 capsule by mouth at bedtime. Take 460 mg by mouth at bedtime.   aspirin 81 MG tablet Take 81 mg by mouth daily.   Basaglar KwikPen 100 UNIT/ML Inject 10 Units into the skin at bedtime.   buPROPion 150 MG 24 hr  tablet Commonly known as: WELLBUTRIN XL Take 150 mg by mouth daily.   calcium-vitamin D 500-5 MG-MCG tablet Commonly known as: OSCAL WITH D Take 1 tablet by mouth 2 (two) times daily.   ciprofloxacin 500 MG tablet Commonly known as: Cipro Take 1 tablet (500 mg total) by mouth 2 (two) times daily for 5 days.   clopidogrel 75 MG tablet Commonly known as: Plavix Take 1 tablet (75 mg total) by mouth daily.   Co Q-10 100 MG Caps Take 1 capsule by mouth daily.   diclofenac Sodium 1 % Gel Commonly known as: VOLTAREN Apply topically. APPLY 2 GRAMS TOPICALLY TO EACH SHOULDER EVERY 6 HOURS AS NEEDED   docusate sodium 100 MG capsule Commonly known as: COLACE Take 100 mg by mouth 2 (two) times daily.   famotidine 10 MG tablet Commonly known as: PEPCID Take 10 mg by mouth daily.   Fleet Saline Enema 7-19 GM/197ML Enem Place rectally once.   fluticasone 50 MCG/ACT nasal spray Commonly known as: FLONASE Place 2 sprays into both nostrils daily.   Generlac 10 GM/15ML Soln Generic drug: lactulose (encephalopathy) Take 30 mLs  by mouth every Monday, Wednesday, and Friday.   guaiFENesin 600 MG 12 hr tablet Commonly known as: MUCINEX Take 600 mg by mouth every 12 (twelve) hours as needed.   insulin lispro 100 UNIT/ML KwikPen Commonly known as: HUMALOG Inject subcutaneously as directed per sliding scale.   ipratropium-albuterol 0.5-2.5 (3) MG/3ML Soln Commonly known as: DUONEB 1 NEBULIZER SOLUTION ( ) BY ORAL INHALATION EVERY 4 TO 6 HOURS AS NEEDED   Januvia 25 MG tablet Generic drug: sitaGLIPtin Take 25 mg by mouth daily.   lactulose 10 GM/15ML solution Commonly known as: CHRONULAC Take 20 g by mouth 2 (two) times daily.   lidocaine 3 % Crea cream Commonly known as: LINDAMANTLE Apply 1 Application topically as needed. Apply to left shoulder every 6 hours as needed along with diclofenac gel.   lidocaine 4 % Place 1 patch onto the skin daily. Apply topically to left  chest. Remove at bedtime.   metoprolol tartrate 25 MG tablet Commonly known as: LOPRESSOR Take 0.5 tablets (12.5 mg total) by mouth 2 (two) times daily. What changed: when to take this   metroNIDAZOLE 500 MG tablet Commonly known as: Flagyl Take 1 tablet (500 mg total) by mouth 2 (two) times daily for 5 days.   nystatin powder Commonly known as: MYCOSTATIN/NYSTOP Apply topically 2 (two) times daily. Apply to groin and perineum twice a day What changed:  when to take this additional instructions   polyethylene glycol powder 17 GM/SCOOP powder Commonly known as: GLYCOLAX/MIRALAX Take 17 g by mouth daily.   pregabalin 50 MG capsule Commonly known as: LYRICA Take 50 mg by mouth 2 (two) times daily.   pregabalin 25 MG capsule Commonly known as: LYRICA Take 25 mg by mouth daily as needed.   PRESERVISION AREDS 2 PO Take 1 capsule by mouth 2 (two) times daily.   rosuvastatin 10 MG tablet Commonly known as: CRESTOR Take 10 mg by mouth at bedtime.   senna 8.6 MG tablet Commonly known as: SENOKOT Take 2 tablets by mouth 2 (two) times daily.   senna-docusate 8.6-50 MG tablet Commonly known as: Senokot-S Take 1 tablet by mouth at bedtime as needed for mild constipation.   simethicone 80 MG chewable tablet Commonly known as: Gas-X Chew 1 tablet (80 mg total) by mouth 4 (four) times daily as needed for flatulence.   Trelegy Ellipta 100-62.5-25 MCG/ACT Aepb Generic drug: Fluticasone-Umeclidin-Vilant Inhale 1 puff into the lungs daily.   Vabysmo 6 MG/0.05ML Sosy intravitreal injection Generic drug: faricimab-svoa 6 mg by Intravitreal route 3 (three) times daily. Administered by the [facility] 3 times a day on Friday every 5 weeks.               Discharge Care Instructions  (From admission, onward)           Start     Ordered   05/15/23 0000  Discharge wound care:       Comments: Cleanse B buttocks and sacrum (Intact skin and open wounds) with Vashe wound  cleanser [facility] [place] [phone]), do not rinse and allow to air dry.  Apply Xeroform gauze [facility] [place] [phone])  to wounds bilateral buttocks and cover with silicone foam or ABD pad whichever is preferred.   Clean R posterior thigh wound with Vashe wound cleanser and apply Xeroform gauze to wound bed daily, cover with silicone foam. May lift foam daily to replace Xeroform. Change foam q3 days and prn soiling   05/15/23 1126  Discharge Exam: Filed Weights   05/12/23 1513  Weight: 98 kg      05/15/2023    9:40 AM 05/15/2023    3:47 AM 05/14/2023   11:20 PM  Vitals with BMI  Systolic 125 124 161  Diastolic 54 58 54  Pulse 76 73 80    General - Elderly obese Caucasian male, no apparent distress HEENT - PERRLA, EOMI, atraumatic head, non tender sinuses. Lung - Clear, basal rales, diffuse wheezes. Heart - S1, S2 heard, no murmurs, rubs, trace pedal edema. Abdomen - Soft, non tender, distended, bowel sounds good. Neuro - Alert, awake and oriented, non focal exam. Skin - Warm and dry. Right AKA.  Condition at discharge: stable  The results of significant diagnostics from this hospitalization (including imaging, microbiology, ancillary and laboratory) are listed below for reference.   Imaging Studies: ECHOCARDIOGRAM COMPLETE Result Date: 05/14/2023    ECHOCARDIOGRAM REPORT   Patient Name:   Melton Walls. Date of Exam: 05/14/2023 Medical Rec #:  [id number]           Height:       68.0 in Accession #:    [id number]          Weight:       216.0 lb Date of Birth:  [birth year]           BSA:          2.112 m Patient Age:    89 years            BP:           127/61 mmHg Patient Gender: M                   HR:           71 bpm. Exam Location:  [facility] Procedure: 2D Echo, Cardiac Doppler, Color Doppler and Intracardiac            Opacification Agent (Both Spectral and Color Flow Doppler were            utilized during procedure). Indications:     Elevated Troponin  History:         Patient has prior  history of Echocardiogram examinations, most                  recent 03/16/2013. CHF, CAD, Prior CABG, COPD; Risk                  Factors:Hypertension and Diabetes. CKD.  Sonographer:     Mikki Harbor Referring Phys:  [id number] Antonieta Iba Diagnosing Phys: Julien Nordmann MD  Sonographer Comments: Technically difficult study due to poor echo windows, no subcostal window and patient is obese. IMPRESSIONS  1. Left ventricular ejection fraction, by estimation, is 55 to 60%. The left ventricle has normal function. The left ventricle has no regional wall motion abnormalities. There is mild left ventricular hypertrophy. Left ventricular diastolic parameters are indeterminate.  2. Right ventricular systolic function is normal. The right ventricular size is normal.  3. The mitral valve is normal in structure. Mild mitral valve regurgitation. No evidence of mitral stenosis.  4. The aortic valve is normal in structure. There is mild calcification of the aortic valve. Aortic valve regurgitation is not visualized. Aortic valve sclerosis is present, with no evidence of aortic valve stenosis.  5. The inferior vena cava is normal in size with greater than 50% respiratory variability, suggesting right atrial pressure of 3 mmHg. FINDINGS  Left  Ventricle: Left ventricular ejection fraction, by estimation, is 55 to 60%. The left ventricle has normal function. The left ventricle has no regional wall motion abnormalities. Definity contrast agent was given IV to delineate the left ventricular  endocardial borders. Strain was performed and the global longitudinal strain is indeterminate. The left ventricular internal cavity size was normal in size. There is mild left ventricular hypertrophy. Left ventricular diastolic parameters are indeterminate. Right Ventricle: The right ventricular size is normal. No increase in right ventricular wall thickness. Right ventricular systolic function is normal. Left Atrium: Left atrial size was normal  in size. Right Atrium: Right atrial size was normal in size. Pericardium: There is no evidence of pericardial effusion. Mitral Valve: The mitral valve is normal in structure. Mild mitral valve regurgitation. No evidence of mitral valve stenosis. MV peak gradient, 4.0 mmHg. The mean mitral valve gradient is 1.0 mmHg. Tricuspid Valve: The tricuspid valve is normal in structure. Tricuspid valve regurgitation is not demonstrated. No evidence of tricuspid stenosis. Aortic Valve: The aortic valve is normal in structure. There is mild calcification of the aortic valve. Aortic valve regurgitation is not visualized. Aortic valve sclerosis is present, with no evidence of aortic valve stenosis. Aortic valve mean gradient  measures 2.0 mmHg. Aortic valve peak gradient measures 4.8 mmHg. Aortic valve area, by VTI measures 2.61 cm. Pulmonic Valve: The pulmonic valve was normal in structure. Pulmonic valve regurgitation is not visualized. No evidence of pulmonic stenosis. Aorta: The aortic root is normal in size and structure. Venous: The inferior vena cava is normal in size with greater than 50% respiratory variability, suggesting right atrial pressure of 3 mmHg. IAS/Shunts: No atrial level shunt detected by color flow Doppler. Additional Comments: 3D was performed not requiring image post processing on an independent workstation and was indeterminate.  LEFT VENTRICLE PLAX 2D LVIDd:         4.80 cm   Diastology LVIDs:         2.80 cm   LV e' medial:    4.50 cm/s LV PW:         1.50 cm   LV E/e' medial:  15.8 LV IVS:        1.20 cm   LV e' lateral:   9.62 cm/s LVOT diam:     2.00 cm   LV E/e' lateral: 7.4 LV SV:         57 LV SV Index:   27 LVOT Area:     3.14 cm  RIGHT VENTRICLE RV Basal diam:  3.80 cm RV Mid diam:    3.50 cm RV S prime:     12.30 cm/s LEFT ATRIUM           Index        RIGHT ATRIUM           Index LA diam:      4.50 cm 2.13 cm/m   RA Area:     18.10 cm LA Vol (A4C): 38.8 ml 18.37 ml/m  RA Volume:   49.20 ml   23.30 ml/m  AORTIC VALVE                    PULMONIC VALVE AV Area (Vmax):    2.36 cm     PV Vmax:       0.80 m/s AV Area (Vmean):   2.13 cm     PV Peak grad:  2.6 mmHg AV Area (VTI):     2.61 cm AV Vmax:  110.00 cm/s AV Vmean:          67.900 cm/s AV VTI:            0.217 m AV Peak Grad:      4.8 mmHg AV Mean Grad:      2.0 mmHg LVOT Vmax:         82.60 cm/s LVOT Vmean:        46.100 cm/s LVOT VTI:          0.180 m LVOT/AV VTI ratio: 0.83  AORTA Ao Root diam: 3.90 cm Ao Asc diam:  3.60 cm MITRAL VALVE MV Area (PHT): 3.99 cm    SHUNTS MV Area VTI:   1.53 cm    Systemic VTI:  0.18 m MV Peak grad:  4.0 mmHg    Systemic Diam: 2.00 cm MV Mean grad:  1.0 mmHg MV Vmax:       1.00 m/s MV Vmean:      54.5 cm/s MV Decel Time: 190 msec MV E velocity: 71.00 cm/s MV A velocity: 79.50 cm/s MV E/A ratio:  0.89 Julien Nordmann MD Electronically signed by Julien Nordmann MD Signature Date/Time: 05/14/2023/3:34:47 PM    Final    US SCROTUM W/DOPPLER Result Date: 05/13/2023 CLINICAL DATA:  Scrotal pain and swelling, left greater than right EXAM: SCROTAL ULTRASOUND DOPPLER ULTRASOUND OF THE TESTICLES TECHNIQUE: Complete ultrasound examination of the testicles, epididymis, and other scrotal structures was performed. Color and spectral Doppler ultrasound were also utilized to evaluate blood flow to the testicles. COMPARISON:  05/12/2023 FINDINGS: Right testicle Measurements: 3.3 x 1.7 x 2.4 cm. No mass or microlithiasis visualized. Left testicle Measurements: 2.6 x 1.4 x 2.4 cm. No mass or microlithiasis visualized. Right epididymis: Heterogeneous appearance without focal abnormality or enlargement. Left epididymis: Heterogeneous appearance with increased vascularity on color Doppler imaging. Incidental 3 mm epididymal cyst of doubtful clinical significance. Hydrocele:  Trace right hydrocele. Varicocele:  None visualized. Pulsed Doppler interrogation of both testes demonstrates normal low resistance arterial and venous  waveforms bilaterally. IMPRESSION: 1. Heterogeneous appearance of the left epididymis with increased vascularity which could reflect epididymitis. 2. Trace right hydrocele. 3. Otherwise unremarkable testicular ultrasound. Electronically Signed   By: Sharlet Salina M.D.   On: 05/13/2023 18:06   CT ABDOMEN PELVIS WO CONTRAST Result Date: 05/12/2023 CLINICAL DATA:  Abdominal pain for 2 days. Bowel obstruction suspected EXAM: CT ABDOMEN AND PELVIS WITHOUT CONTRAST TECHNIQUE: Multidetector CT imaging of the abdomen and pelvis was performed following the standard protocol without IV contrast. RADIATION DOSE REDUCTION: This exam was performed according to the departmental dose-optimization program which includes automated exposure control, adjustment of the mA and/or kV according to patient size and/or use of iterative reconstruction technique. COMPARISON:  CT abdomen pelvis 04/28/2023 FINDINGS: Lower chest: Bibasilar atelectasis/scarring.  No acute abnormality. Hepatobiliary: No acute abnormality. Pancreas: Fatty atrophy.  No acute abnormality. Spleen: Unremarkable. Adrenals/Urinary Tract: Normal adrenal glands. No urinary calculi or hydronephrosis. Unchanged nonspecific perinephric stranding. Thickening of the nondistended bladder. Stomach/Bowel: Stomach is within normal limits. No bowel obstruction. Moderate wall thickening about the rectum. Marked adjacent stranding and trace free fluid. There is gas within the left rectal/anal wall extending through the subcutaneous fat along the left gluteal cleft compatible with anocutaneous fistula. Vascular/Lymphatic: Aortic atherosclerosis. No enlarged abdominal or pelvic lymph nodes. Reproductive: Prostatectomy. Other: No free intraperitoneal air.  No abscess. Musculoskeletal: No acute fracture. IMPRESSION: 1. Proctitis with left-sided anocutaneous fistula. No abscess. 2. Thickening of the nondistended bladder. Correlate with urinalysis to exclude  cystitis. 3. Aortic  Atherosclerosis (ICD10-I70.0). Electronically Signed   By: Minerva Fester M.D.   On: 05/12/2023 19:59   CT ABDOMEN PELVIS W CONTRAST Result Date: 04/28/2023 CLINICAL DATA:  Abnormal plain film at facility, evaluate for volvulus. EXAM: CT ABDOMEN AND PELVIS WITH CONTRAST TECHNIQUE: Multidetector CT imaging of the abdomen and pelvis was performed using the standard protocol following bolus administration of intravenous contrast. RADIATION DOSE REDUCTION: This exam was performed according to the departmental dose-optimization program which includes automated exposure control, adjustment of the mA and/or kV according to patient size and/or use of iterative reconstruction technique. CONTRAST:  75mL OMNIPAQUE IOHEXOL 300 MG/ML  SOLN COMPARISON:  09/28/2021 FINDINGS: Lower chest: No acute abnormality. Hepatobiliary: No focal liver abnormality is seen. No gallstones, gallbladder wall thickening, or biliary dilatation. Pancreas: Unremarkable. No pancreatic ductal dilatation or surrounding inflammatory changes. Spleen: Normal in size without focal abnormality. Adrenals/Urinary Tract: Adrenal glands are within normal limits. Kidneys demonstrate a normal enhancement pattern bilaterally. Left renal cyst is noted stable from the prior exam. It is simple in nature and no further follow-up is recommended. No obstructive changes are seen. The bladder is partially distended. Stomach/Bowel: The appendix is not well visualized and may have been surgically removed. Fecal material is noted scattered throughout the colon. The stomach and small bowel are within normal limits. Vascular/Lymphatic: Aortic atherosclerosis. No enlarged abdominal or pelvic lymph nodes. Reproductive: Status post prostatectomy. Other: No abdominal wall hernia or abnormality. No abdominopelvic ascites. Musculoskeletal: Degenerative changes of lumbar spine are noted. Prior surgical fusion at L4-5 is noted. IMPRESSION: No acute abnormality noted Electronically  Signed   By: Alcide Clever M.D.   On: 04/28/2023 02:36    Microbiology: Results for orders placed or performed during the hospital encounter of 05/12/23  Blood culture (routine x 2)     Status: None (Preliminary result)   Collection Time: 05/12/23 11:35 PM   Specimen: BLOOD  Result Value Ref Range Status   Specimen Description BLOOD LAC  Final   Special Requests   Final    BOTTLES DRAWN AEROBIC AND ANAEROBIC Blood Culture results may not be optimal due to an inadequate volume of blood received in culture bottles   Culture   Final    NO GROWTH 3 DAYS Performed at [facility], [address], [place], [place] [address]    Report Status PENDING  Incomplete  Blood culture (routine x 2)     Status: None (Preliminary result)   Collection Time: 05/12/23 11:46 PM   Specimen: BLOOD  Result Value Ref Range Status   Specimen Description BLOOD RW`  Final   Special Requests   Final    BOTTLES DRAWN AEROBIC AND ANAEROBIC Blood Culture adequate volume   Culture   Final    NO GROWTH 3 DAYS Performed at [facility], [address], [place], [place] [address]    Report Status PENDING  Incomplete  Urine Culture     Status: Abnormal   Collection Time: 05/13/23 12:35 AM   Specimen: Urine, Random  Result Value Ref Range Status   Specimen Description   Final    URINE, RANDOM Performed at [facility], [address], [place], [place] [address]    Special Requests   Final    NONE Reflexed from [phone] [phone] Performed at [facility], [address] [place], [place] [address]    Culture (A)  Final    >=100,000 COLONIES/mL PROTEUS MIRABILIS 30,000 COLONIES/mL AEROCOCCUS SPECIES Standardized susceptibility testing for  this organism is not available. Performed at [facility], [address], [place], [place] [address]    Report Status 05/15/2023 FINAL  Final   Organism ID, Bacteria PROTEUS MIRABILIS (A)  Final      Susceptibility   Proteus mirabilis -  MIC*    AMPICILLIN 8 SENSITIVE Sensitive     CEFAZOLIN <=4 SENSITIVE Sensitive     CEFEPIME <=0.12 SENSITIVE Sensitive     CEFTRIAXONE <=0.25 SENSITIVE Sensitive     CIPROFLOXACIN <=0.25 SENSITIVE Sensitive     GENTAMICIN <=1 SENSITIVE Sensitive     IMIPENEM 8 INTERMEDIATE Intermediate     NITROFURANTOIN 128 RESISTANT Resistant     TRIMETH/SULFA >=320 RESISTANT Resistant     AMPICILLIN/SULBACTAM <=2 SENSITIVE Sensitive     PIP/TAZO <=4 SENSITIVE Sensitive ug/mL    * >=100,000 COLONIES/mL PROTEUS MIRABILIS    Labs: CBC: Recent Labs  Lab 05/12/23 1541 05/13/23 0617 05/14/23 0750 05/15/23 0425  WBC 19.0* 17.2* 16.6* 12.6*  HGB 11.7* 11.1* 10.6* 9.9*  HCT 37.3* 34.5* 33.2* 29.9*  MCV 93.0 90.3 95.4 93.1  PLT 177 172 147* 163   Basic Metabolic Panel: Recent Labs  Lab 05/12/23 1541 05/13/23 0151 05/13/23 0617 05/14/23 0750 05/15/23 0425  NA 138 137  --  137 136  K 4.0 3.8  --  3.8 3.3*  CL 101 104  --  107 104  CO2 21* 24  --  21* 21*  GLUCOSE 116* 135*  --  118* 126*  BUN 39* 40*  --  47* 47*  CREATININE 2.24* 2.15*  --  2.65* 2.65*  CALCIUM 6.6* 6.0*  --  6.2* 6.8*  MG  --   --  2.2  --   --    Liver Function Tests: Recent Labs  Lab 05/12/23 1541 05/13/23 0151 05/14/23 0750  AST 13* 13* 13*  ALT 9 10 10   ALKPHOS 56 62 50  BILITOT 0.6 1.0 0.9  PROT 5.8* 5.6* 5.4*  ALBUMIN 3.0* 2.8* 2.6*   CBG: Recent Labs  Lab 05/14/23 1634 05/14/23 2003 05/14/23 2325 05/15/23 0343 05/15/23 0852  GLUCAP 157* 99 85 107* 123*    Discharge time spent: 36 minutes.  Signed: Marcelino Duster, MD [facility] 05/15/2023

## 2023-05-15 NOTE — Plan of Care (Signed)

## 2023-05-15 NOTE — Consult Note (Signed)
 WOC team consulted for back, sacral and posterior thigh wounds.  Please note that the [facility] nursing team is utilizing a standardized work plan to manage patient consults. We are triaging consults and will try to see the patients within 48 hours. Wound photos in the patient's chart allow [place] to consult on the patient in the most efficient and timely manner.    Thank you,    Priscella Mann MSN, RN-BC, [employer] [phone]

## 2023-05-15 NOTE — Consult Note (Signed)
 WOC Nurse Consult Note: Reason for Consult: R posterior thigh and buttocks wounds  Wound type: Stage 2 Pressure Injuries with surrounding chronic tissue damage  Pressure Injury POA: Yes Measurement: see nursing flowsheet  Wound bed: all wounds appear pink and moist  Drainage (amount, consistency, odor)  see nursing flowsheet Periwound: patient has evidence of chronic tissue with widespread erythema/purplish discoloration to B buttocks and sacrum, also peeling epithelium; also noted dry black/brown material intergluteal cleft however patient has a perirectal fistula per surgeon note and ? Dried exudate  Dressing procedure/placement/frequency: Cleanse B buttocks and sacrum (Intact skin and open wounds) with Vashe wound cleanser Hart [place] [phone]), do not rinse and allow to air dry.  Apply Xeroform gauze Hart [place] [phone])  to wounds bilateral buttocks and cover with silicone foam or ABD pad whichever is preferred.  Clean R posterior thigh wound with Vashe wound cleanser and apply Xeroform gauze to wound bed daily, cover with silicone foam. May lift foam daily to replace Xeroform. Change foam q3 days and prn soiling.   POC discussed with bedside nurse. WOC team will not follow. Re-consult if further needs arise.   Thank you,    Priscella Mann MSN, RN-BC, [employer] [phone]

## 2023-05-15 NOTE — TOC Transition Note (Addendum)
 Transition of Care [facility]) - Discharge Note   Patient Details  Name: [name]. MRN: [id number] Date of Birth: 10-12-1930  Transition of [facility]) CM/SW Contact:  Liliana Cline, LCSW Phone Number: 05/15/2023, 10:58 AM   Clinical Narrative:    Discharge to [facility] today. Room 318. Confirmed with Admissions Worker Inetta Fermo. Updated MD, RN, and son Raiford Noble. Asked RN to call report. EMS paperwork completed. Will call [facility] for EMS transport when patient is ready for transport.  12:30- Cytogeneticist for transport.    Final next level of care: Skilled Nursing Facility Barriers to Discharge: Barriers Resolved   Patient Goals and CMS Choice            Discharge Placement              Patient chooses bed at: [facility] Patient to be transferred to facility by: [facility] Name of family member notified: Raiford Noble Patient and family notified of of transfer: 05/15/23  Discharge Plan and Services Additional resources added to the After Visit Summary for       Post Acute Care Choice: Resumption of Svcs/PTA Provider                               Social Drivers of Health (SDOH) Interventions SDOH Screenings   Food Insecurity: No Food Insecurity (05/14/2023)  Housing: Low Risk  (05/14/2023)  Transportation Needs: No Transportation Needs (05/14/2023)  Utilities: Not At Risk (05/14/2023)  Depression (PHQ2-9): Low Risk  (04/07/2019)  Social Connections: Socially Isolated (05/14/2023)  Tobacco Use: Medium Risk (05/14/2023)     Readmission Risk Interventions    05/14/2023   12:33 PM  Readmission Risk Prevention Plan  Transportation Screening Complete  PCP or Specialist Appt within 3-5 Days Complete  Social Work Consult for Recovery Care Planning/Counseling Complete  Palliative Care Screening Not Applicable  Medication Review [employer]) Complete

## 2023-05-17 LAB — CULTURE, BLOOD (ROUTINE X 2)
Culture: NO GROWTH
Culture: NO GROWTH
Special Requests: ADEQUATE

## 2023-06-18 ENCOUNTER — Other Ambulatory Visit: Payer: Self-pay

## 2023-06-18 DIAGNOSIS — R109 Unspecified abdominal pain: Secondary | ICD-10-CM

## 2023-06-18 DIAGNOSIS — D485 Neoplasm of uncertain behavior of skin: Secondary | ICD-10-CM

## 2023-07-01 ENCOUNTER — Ambulatory Visit
Admission: RE | Admit: 2023-07-01 | Discharge: 2023-07-01 | Disposition: A | Discharge status: Home / Self Care | Source: Ambulatory Visit

## 2023-07-01 DIAGNOSIS — R109 Unspecified abdominal pain: Secondary | ICD-10-CM | POA: Insufficient documentation

## 2023-07-01 DIAGNOSIS — D485 Neoplasm of uncertain behavior of skin: Secondary | ICD-10-CM | POA: Diagnosis present

## 2023-07-15 DIAGNOSIS — D485 Neoplasm of uncertain behavior of skin: Secondary | ICD-10-CM

## 2024-02-05 ENCOUNTER — Emergency Department

## 2024-02-05 ENCOUNTER — Inpatient Hospital Stay
Admission: EM | Admit: 2024-02-05 | Discharge: 2024-02-12 | DRG: 871 | Disposition: A | Discharge status: Skilled Nursing Facility | Source: Skilled Nursing Facility | Attending: Hospitalist | Admitting: Hospitalist

## 2024-02-05 ENCOUNTER — Other Ambulatory Visit: Payer: Self-pay

## 2024-02-05 DIAGNOSIS — J9601 Acute respiratory failure with hypoxia: Secondary | ICD-10-CM | POA: Diagnosis not present

## 2024-02-05 DIAGNOSIS — Z951 Presence of aortocoronary bypass graft: Secondary | ICD-10-CM

## 2024-02-05 DIAGNOSIS — Z1152 Encounter for screening for COVID-19: Secondary | ICD-10-CM | POA: Diagnosis not present

## 2024-02-05 DIAGNOSIS — J9621 Acute and chronic respiratory failure with hypoxia: Secondary | ICD-10-CM | POA: Diagnosis present

## 2024-02-05 DIAGNOSIS — Z882 Allergy status to sulfonamides status: Secondary | ICD-10-CM

## 2024-02-05 DIAGNOSIS — Z66 Do not resuscitate: Secondary | ICD-10-CM | POA: Diagnosis present

## 2024-02-05 DIAGNOSIS — E1140 Type 2 diabetes mellitus with diabetic neuropathy, unspecified: Secondary | ICD-10-CM | POA: Diagnosis present

## 2024-02-05 DIAGNOSIS — F32A Depression, unspecified: Secondary | ICD-10-CM | POA: Diagnosis present

## 2024-02-05 DIAGNOSIS — L89313 Pressure ulcer of right buttock, stage 3: Secondary | ICD-10-CM | POA: Diagnosis present

## 2024-02-05 DIAGNOSIS — Z9981 Dependence on supplemental oxygen: Secondary | ICD-10-CM

## 2024-02-05 DIAGNOSIS — F419 Anxiety disorder, unspecified: Secondary | ICD-10-CM | POA: Diagnosis present

## 2024-02-05 DIAGNOSIS — I4719 Other supraventricular tachycardia: Secondary | ICD-10-CM | POA: Diagnosis present

## 2024-02-05 DIAGNOSIS — R6520 Severe sepsis without septic shock: Secondary | ICD-10-CM | POA: Diagnosis present

## 2024-02-05 DIAGNOSIS — E1122 Type 2 diabetes mellitus with diabetic chronic kidney disease: Secondary | ICD-10-CM | POA: Diagnosis present

## 2024-02-05 DIAGNOSIS — I130 Hypertensive heart and chronic kidney disease with heart failure and stage 1 through stage 4 chronic kidney disease, or unspecified chronic kidney disease: Secondary | ICD-10-CM | POA: Diagnosis present

## 2024-02-05 DIAGNOSIS — I2489 Other forms of acute ischemic heart disease: Secondary | ICD-10-CM | POA: Diagnosis present

## 2024-02-05 DIAGNOSIS — I10 Essential (primary) hypertension: Secondary | ICD-10-CM | POA: Diagnosis not present

## 2024-02-05 DIAGNOSIS — Z7982 Long term (current) use of aspirin: Secondary | ICD-10-CM

## 2024-02-05 DIAGNOSIS — E1151 Type 2 diabetes mellitus with diabetic peripheral angiopathy without gangrene: Secondary | ICD-10-CM | POA: Diagnosis present

## 2024-02-05 DIAGNOSIS — Z6833 Body mass index (BMI) 33.0-33.9, adult: Secondary | ICD-10-CM

## 2024-02-05 DIAGNOSIS — Z7902 Long term (current) use of antithrombotics/antiplatelets: Secondary | ICD-10-CM

## 2024-02-05 DIAGNOSIS — J189 Pneumonia, unspecified organism: Secondary | ICD-10-CM | POA: Diagnosis present

## 2024-02-05 DIAGNOSIS — Z89611 Acquired absence of right leg above knee: Secondary | ICD-10-CM

## 2024-02-05 DIAGNOSIS — I5033 Acute on chronic diastolic (congestive) heart failure: Secondary | ICD-10-CM | POA: Diagnosis present

## 2024-02-05 DIAGNOSIS — R32 Unspecified urinary incontinence: Secondary | ICD-10-CM | POA: Diagnosis present

## 2024-02-05 DIAGNOSIS — J9622 Acute and chronic respiratory failure with hypercapnia: Secondary | ICD-10-CM | POA: Diagnosis present

## 2024-02-05 DIAGNOSIS — J9602 Acute respiratory failure with hypercapnia: Secondary | ICD-10-CM | POA: Diagnosis not present

## 2024-02-05 DIAGNOSIS — E66811 Obesity, class 1: Secondary | ICD-10-CM | POA: Diagnosis present

## 2024-02-05 DIAGNOSIS — J9600 Acute respiratory failure, unspecified whether with hypoxia or hypercapnia: Secondary | ICD-10-CM | POA: Diagnosis present

## 2024-02-05 DIAGNOSIS — I5031 Acute diastolic (congestive) heart failure: Secondary | ICD-10-CM | POA: Diagnosis not present

## 2024-02-05 DIAGNOSIS — Z87891 Personal history of nicotine dependence: Secondary | ICD-10-CM

## 2024-02-05 DIAGNOSIS — J440 Chronic obstructive pulmonary disease with acute lower respiratory infection: Secondary | ICD-10-CM | POA: Diagnosis present

## 2024-02-05 DIAGNOSIS — Z880 Allergy status to penicillin: Secondary | ICD-10-CM

## 2024-02-05 DIAGNOSIS — A419 Sepsis, unspecified organism: Secondary | ICD-10-CM | POA: Diagnosis present

## 2024-02-05 DIAGNOSIS — J441 Chronic obstructive pulmonary disease with (acute) exacerbation: Secondary | ICD-10-CM | POA: Diagnosis present

## 2024-02-05 DIAGNOSIS — I252 Old myocardial infarction: Secondary | ICD-10-CM

## 2024-02-05 DIAGNOSIS — Z8249 Family history of ischemic heart disease and other diseases of the circulatory system: Secondary | ICD-10-CM

## 2024-02-05 DIAGNOSIS — D696 Thrombocytopenia, unspecified: Secondary | ICD-10-CM | POA: Diagnosis present

## 2024-02-05 DIAGNOSIS — I2510 Atherosclerotic heart disease of native coronary artery without angina pectoris: Secondary | ICD-10-CM | POA: Diagnosis not present

## 2024-02-05 DIAGNOSIS — I259 Chronic ischemic heart disease, unspecified: Secondary | ICD-10-CM | POA: Diagnosis not present

## 2024-02-05 DIAGNOSIS — Z794 Long term (current) use of insulin: Secondary | ICD-10-CM

## 2024-02-05 DIAGNOSIS — Q059 Spina bifida, unspecified: Secondary | ICD-10-CM

## 2024-02-05 DIAGNOSIS — E785 Hyperlipidemia, unspecified: Secondary | ICD-10-CM | POA: Diagnosis not present

## 2024-02-05 DIAGNOSIS — N183 Chronic kidney disease, stage 3 unspecified: Secondary | ICD-10-CM | POA: Diagnosis not present

## 2024-02-05 DIAGNOSIS — L89323 Pressure ulcer of left buttock, stage 3: Secondary | ICD-10-CM | POA: Diagnosis present

## 2024-02-05 DIAGNOSIS — D631 Anemia in chronic kidney disease: Secondary | ICD-10-CM | POA: Diagnosis present

## 2024-02-05 DIAGNOSIS — R072 Precordial pain: Secondary | ICD-10-CM | POA: Diagnosis not present

## 2024-02-05 DIAGNOSIS — L8943 Pressure ulcer of contiguous site of back, buttock and hip, stage 3: Secondary | ICD-10-CM | POA: Diagnosis present

## 2024-02-05 DIAGNOSIS — N1832 Chronic kidney disease, stage 3b: Secondary | ICD-10-CM | POA: Diagnosis present

## 2024-02-05 DIAGNOSIS — R54 Age-related physical debility: Secondary | ICD-10-CM | POA: Diagnosis present

## 2024-02-05 DIAGNOSIS — R079 Chest pain, unspecified: Secondary | ICD-10-CM | POA: Diagnosis not present

## 2024-02-05 DIAGNOSIS — I25110 Atherosclerotic heart disease of native coronary artery with unstable angina pectoris: Secondary | ICD-10-CM | POA: Diagnosis not present

## 2024-02-05 DIAGNOSIS — F4310 Post-traumatic stress disorder, unspecified: Secondary | ICD-10-CM | POA: Diagnosis present

## 2024-02-05 DIAGNOSIS — Z886 Allergy status to analgesic agent status: Secondary | ICD-10-CM

## 2024-02-05 DIAGNOSIS — Z79899 Other long term (current) drug therapy: Secondary | ICD-10-CM

## 2024-02-05 DIAGNOSIS — Z7984 Long term (current) use of oral hypoglycemic drugs: Secondary | ICD-10-CM

## 2024-02-05 DIAGNOSIS — J9620 Acute and chronic respiratory failure, unspecified whether with hypoxia or hypercapnia: Secondary | ICD-10-CM | POA: Diagnosis not present

## 2024-02-05 LAB — CBC WITH DIFFERENTIAL/PLATELET
Abs Immature Granulocytes: 0.09 K/uL — ABNORMAL HIGH (ref 0.00–0.07)
Basophils Absolute: 0 K/uL (ref 0.0–0.1)
Basophils Relative: 0 %
Eosinophils Absolute: 0.1 K/uL (ref 0.0–0.5)
Eosinophils Relative: 1 %
HCT: 34.5 % — ABNORMAL LOW (ref 39.0–52.0)
Hemoglobin: 10.5 g/dL — ABNORMAL LOW (ref 13.0–17.0)
Immature Granulocytes: 1 %
Lymphocytes Relative: 14 %
Lymphs Abs: 2 K/uL (ref 0.7–4.0)
MCH: 29.8 pg (ref 26.0–34.0)
MCHC: 30.4 g/dL (ref 30.0–36.0)
MCV: 98 fL (ref 80.0–100.0)
Monocytes Absolute: 1.6 K/uL — ABNORMAL HIGH (ref 0.1–1.0)
Monocytes Relative: 11 %
Neutro Abs: 10.2 K/uL — ABNORMAL HIGH (ref 1.7–7.7)
Neutrophils Relative %: 73 %
Platelets: 136 K/uL — ABNORMAL LOW (ref 150–400)
RBC: 3.52 MIL/uL — ABNORMAL LOW (ref 4.22–5.81)
RDW: 14.6 % (ref 11.5–15.5)
WBC: 14.1 K/uL — ABNORMAL HIGH (ref 4.0–10.5)
nRBC: 0 % (ref 0.0–0.2)

## 2024-02-05 LAB — PROTIME-INR
INR: 1 (ref 0.8–1.2)
Prothrombin Time: 13.7 s (ref 11.4–15.2)

## 2024-02-05 LAB — COMPREHENSIVE METABOLIC PANEL WITH GFR
ALT: 6 U/L (ref 0–44)
AST: 15 U/L (ref 15–41)
Albumin: 3.1 g/dL — ABNORMAL LOW (ref 3.5–5.0)
Alkaline Phosphatase: 75 U/L (ref 38–126)
Anion gap: 10 (ref 5–15)
BUN: 35 mg/dL — ABNORMAL HIGH (ref 8–23)
CO2: 27 mmol/L (ref 22–32)
Calcium: 8.2 mg/dL — ABNORMAL LOW (ref 8.9–10.3)
Chloride: 101 mmol/L (ref 98–111)
Creatinine, Ser: 1.84 mg/dL — ABNORMAL HIGH (ref 0.61–1.24)
GFR, Estimated: 34 mL/min — ABNORMAL LOW (ref >60)
Glucose, Bld: 182 mg/dL — ABNORMAL HIGH (ref 70–99)
Potassium: 4.7 mmol/L (ref 3.5–5.1)
Sodium: 138 mmol/L (ref 135–145)
Total Bilirubin: 0.3 mg/dL (ref 0.0–1.2)
Total Protein: 5.8 g/dL — ABNORMAL LOW (ref 6.5–8.1)

## 2024-02-05 LAB — BLOOD GAS, VENOUS
Acid-Base Excess: 0.1 mmol/L (ref 0.0–2.0)
Bicarbonate: 28.5 mmol/L — ABNORMAL HIGH (ref 20.0–28.0)
O2 Saturation: 84.5 %
Patient temperature: 37
pCO2, Ven: 62 mmHg — ABNORMAL HIGH (ref 44–60)
pH, Ven: 7.27 (ref 7.25–7.43)
pO2, Ven: 51 mmHg — ABNORMAL HIGH (ref 32–45)

## 2024-02-05 LAB — LACTIC ACID, PLASMA
Lactic Acid, Venous: 1 mmol/L (ref 0.5–1.9)
Lactic Acid, Venous: 1.1 mmol/L (ref 0.5–1.9)

## 2024-02-05 LAB — RESP PANEL BY RT-PCR (RSV, FLU A&B, COVID)  RVPGX2
Influenza A by PCR: NEGATIVE
Influenza B by PCR: NEGATIVE
Resp Syncytial Virus by PCR: NEGATIVE
SARS Coronavirus 2 by RT PCR: NEGATIVE

## 2024-02-05 LAB — PRO BRAIN NATRIURETIC PEPTIDE: Pro Brain Natriuretic Peptide: 2383 pg/mL — ABNORMAL HIGH (ref <300.0)

## 2024-02-05 MED ORDER — HYDROCOD POLI-CHLORPHE POLI ER 10-8 MG/5ML PO SUER
5.0000 mL | Freq: Two times a day (BID) | ORAL | Status: DC | PRN
  Administered 2024-02-08: 22:00:00 5 mL via ORAL
  Filled 2024-02-05: qty 5

## 2024-02-05 MED ORDER — SODIUM CHLORIDE 0.9 % IV SOLN
2.0000 g | Freq: Once | INTRAVENOUS | Status: AC
  Administered 2024-02-05: 2 g via INTRAVENOUS
  Filled 2024-02-05: qty 12.5

## 2024-02-05 MED ORDER — SODIUM CHLORIDE 0.9 % IV SOLN
2.0000 g | INTRAVENOUS | Status: AC
  Administered 2024-02-06 – 2024-02-10 (×5): 2 g via INTRAVENOUS
  Filled 2024-02-05 (×5): qty 20

## 2024-02-05 MED ORDER — TRAZODONE HCL 50 MG PO TABS
25.0000 mg | ORAL_TABLET | Freq: Every evening | ORAL | Status: DC | PRN
  Administered 2024-02-07 – 2024-02-08 (×2): 25 mg via ORAL
  Filled 2024-02-05 (×2): qty 1

## 2024-02-05 MED ORDER — ONDANSETRON HCL 4 MG/2ML IJ SOLN
4.0000 mg | Freq: Four times a day (QID) | INTRAMUSCULAR | Status: DC | PRN
  Administered 2024-02-08: 17:00:00 4 mg via INTRAVENOUS
  Filled 2024-02-05: qty 2

## 2024-02-05 MED ORDER — ENOXAPARIN SODIUM 30 MG/0.3ML IJ SOSY
30.0000 mg | PREFILLED_SYRINGE | Freq: Every day | INTRAMUSCULAR | Status: DC
  Administered 2024-02-05 – 2024-02-06 (×2): 30 mg via SUBCUTANEOUS
  Filled 2024-02-05 (×2): qty 0.3

## 2024-02-05 MED ORDER — ACETAMINOPHEN 650 MG RE SUPP: 650.0000 mg | Freq: Four times a day (QID) | RECTAL | Status: DC | PRN

## 2024-02-05 MED ORDER — ACETAMINOPHEN 325 MG PO TABS
650.0000 mg | ORAL_TABLET | Freq: Four times a day (QID) | ORAL | Status: DC | PRN
  Administered 2024-02-08: 22:00:00 650 mg via ORAL
  Filled 2024-02-05: qty 2

## 2024-02-05 MED ORDER — MAGNESIUM HYDROXIDE 400 MG/5ML PO SUSP: 30.0000 mL | Freq: Every day | ORAL | Status: DC | PRN

## 2024-02-05 MED ORDER — IPRATROPIUM-ALBUTEROL 0.5-2.5 (3) MG/3ML IN SOLN
3.0000 mL | Freq: Four times a day (QID) | RESPIRATORY_TRACT | Status: DC
  Administered 2024-02-05 – 2024-02-07 (×6): 3 mL via RESPIRATORY_TRACT
  Filled 2024-02-05 (×6): qty 3

## 2024-02-05 MED ORDER — ONDANSETRON HCL 4 MG PO TABS
4.0000 mg | ORAL_TABLET | Freq: Four times a day (QID) | ORAL | Status: DC | PRN
  Filled 2024-02-05: qty 1

## 2024-02-05 MED ORDER — GUAIFENESIN ER 600 MG PO TB12
600.0000 mg | ORAL_TABLET | Freq: Two times a day (BID) | ORAL | Status: DC
  Administered 2024-02-05: 600 mg via ORAL
  Filled 2024-02-05: qty 1

## 2024-02-05 MED ORDER — SODIUM CHLORIDE 0.9 % IV SOLN
500.0000 mg | INTRAVENOUS | Status: DC
  Administered 2024-02-05: 500 mg via INTRAVENOUS
  Filled 2024-02-05: qty 5

## 2024-02-05 MED ORDER — IPRATROPIUM-ALBUTEROL 0.5-2.5 (3) MG/3ML IN SOLN
6.0000 mL | Freq: Once | RESPIRATORY_TRACT | Status: AC
  Administered 2024-02-05: 6 mL via RESPIRATORY_TRACT
  Filled 2024-02-05: qty 6

## 2024-02-05 MED ORDER — SODIUM CHLORIDE 0.9 % IV SOLN: 500.0000 mg | INTRAVENOUS | Status: DC

## 2024-02-05 MED ORDER — LACTATED RINGERS IV SOLN
150.0000 mL/h | INTRAVENOUS | Status: DC
  Administered 2024-02-05 – 2024-02-06 (×2): 150 mL/h via INTRAVENOUS

## 2024-02-05 MED ORDER — METHYLPREDNISOLONE SODIUM SUCC 125 MG IJ SOLR: 125.0000 mg | Freq: Once | INTRAMUSCULAR | Status: DC

## 2024-02-05 NOTE — ED Notes (Signed)
 Pt sat up in bed at this time, coughing while on BiPap.

## 2024-02-05 NOTE — Sepsis Progress Note (Signed)
 eLink is following this Code Sepsis.

## 2024-02-05 NOTE — ED Provider Notes (Signed)
 [facility] Provider Note    Event Date/Time   First MD Initiated Contact with Patient 02/05/24 1738     (approximate)   History   Chief Complaint Cough and Shortness of Breath   HPI  [name]. is a 89 y.o. male with past medical history of hypertension, hyperlipidemia, diabetes, CAD status post CABG, COPD on supplemental oxygen , and CKD who presents to the ED complaining of shortness of breath.  Sons at bedside report patient has been dealing with a cough for the past couple of days, was diagnosed with pneumonia at his nursing facility and started on doxycycline .  He has continued to worsen since then with increasing difficulty breathing and EMS was contacted tonight.  Patient denies any pain in his chest and has not noticed any pain or swelling in his legs.  He received IV Solu-Medrol  and 1 DuoNeb prior to arrival.     Physical Exam   Triage Vital Signs: ED Triage Vitals  Encounter Vitals Group     BP      Girls Systolic BP Percentile      Girls Diastolic BP Percentile      Boys Systolic BP Percentile      Boys Diastolic BP Percentile      Pulse      Resp      Temp      Temp src      SpO2      Weight      Height      Head Circumference      Peak Flow      Pain Score      Pain Loc      Pain Education      Exclude from Growth Chart     Most recent vital signs: Vitals:   02/05/24 1739 02/05/24 1825  BP: (!) 159/65   Pulse: (!) 104 (!) 102  Resp: (!) 34 (!) 28  Temp: 99.8 F (37.7 C)   SpO2: 100% 100%    Constitutional: Somnolent but arousable to voice. Eyes: Conjunctivae are normal. Head: Atraumatic. Nose: No congestion/rhinnorhea. Mouth/Throat: Mucous membranes are moist.  Cardiovascular: Tachycardic, regular rhythm. Grossly normal heart sounds.  2+ radial pulses bilaterally. Respiratory: Tachypneic with increased work of breathing, wheezing noted throughout. Gastrointestinal: Soft and nontender. No  distention. Musculoskeletal: No lower extremity tenderness nor edema.  Neurologic:  Normal speech and language. No gross focal neurologic deficits are appreciated.    ED Results / Procedures / Treatments   Labs (all labs ordered are listed, but only abnormal results are displayed) Labs Reviewed  COMPREHENSIVE METABOLIC PANEL WITH GFR - Abnormal; Notable for the following components:      Result Value   Glucose, Bld 182 (*)    BUN 35 (*)    Creatinine, Ser 1.84 (*)    Calcium  8.2 (*)    Total Protein 5.8 (*)    Albumin 3.1 (*)    GFR, Estimated 34 (*)    All other components within normal limits  CBC WITH DIFFERENTIAL/PLATELET - Abnormal; Notable for the following components:   WBC 14.1 (*)    RBC 3.52 (*)    Hemoglobin 10.5 (*)    HCT 34.5 (*)    Platelets 136 (*)    Neutro Abs 10.2 (*)    Monocytes Absolute 1.6 (*)    Abs Immature Granulocytes 0.09 (*)    All other components within normal limits  BLOOD GAS, VENOUS - Abnormal; Notable for the  following components:   pCO2, Ven 62 (*)    pO2, Ven 51 (*)    Bicarbonate 28.5 (*)    All other components within normal limits  RESP PANEL BY RT-PCR (RSV, FLU A&B, COVID)  RVPGX2  CULTURE, BLOOD (ROUTINE X 2)  CULTURE, BLOOD (ROUTINE X 2)  LACTIC ACID, PLASMA  LACTIC ACID, PLASMA  PROTIME-INR  URINALYSIS, W/ REFLEX TO CULTURE (INFECTION SUSPECTED)  PRO BRAIN NATRIURETIC PEPTIDE     EKG  ED ECG REPORT I, Carlin Palin, the attending physician, personally viewed and interpreted this ECG.   Date: 02/05/2024  EKG Time: 17:38  Rate: 103  Rhythm: sinus tachycardia  Axis: Normal  Intervals:none  ST&T Change: ST elevation in aVR with ST depression laterally  RADIOLOGY Chest x-ray reviewed and interpreted by me with left lower lobe infiltrate.  PROCEDURES:  Critical Care performed: Yes, see critical care procedure note(s)  .Critical Care  Performed by: Palin Carlin, MD Authorized by: Palin Carlin, MD    Critical care provider statement:    Critical care time (minutes):  30   Critical care time was exclusive of:  Separately billable procedures and treating other patients and teaching time   Critical care was necessary to treat or prevent imminent or life-threatening deterioration of the following conditions:  Respiratory failure   Critical care was time spent personally by me on the following activities:  Development of treatment plan with patient or surrogate, discussions with consultants, evaluation of patient's response to treatment, examination of patient, ordering and review of laboratory studies, ordering and review of radiographic studies, ordering and performing treatments and interventions, pulse oximetry, re-evaluation of patient's condition and review of old charts   I assumed direction of critical care for this patient from another provider in my specialty: no     Care discussed with: admitting provider      MEDICATIONS ORDERED IN ED: Medications  azithromycin  (ZITHROMAX ) 500 mg in sodium chloride  0.9 % 250 mL IVPB (500 mg Intravenous New Bag/Given 02/05/24 1914)  ipratropium-albuterol  (DUONEB) 0.5-2.5 (3) MG/3ML nebulizer solution 6 mL (6 mLs Nebulization Given 02/05/24 1802)  ceFEPIme (MAXIPIME) 2 g in sodium chloride  0.9 % 100 mL IVPB (0 g Intravenous Stopped 02/05/24 1909)     IMPRESSION / MDM / ASSESSMENT AND PLAN / ED COURSE  I reviewed the triage vital signs and the nursing notes.                              89 y.o. male with past medical history of hypertension, hyperlipidemia, diabetes, CAD status post CABG, CKD, and COPD who presents to the ED with increased cough and difficulty breathing over the past 2 days.  Patient's presentation is most consistent with acute presentation with potential threat to life or bodily function.  Differential diagnosis includes, but is not limited to, COPD exacerbation, sepsis, pneumonia, ACS, PE, CHF, COVID-19, influenza, anemia,  electrolyte abnormality, AKI.  Patient ill-appearing and in respiratory distress, tachypneic with increased work of breathing but maintaining oxygen  saturations on 4 L nasal cannula.  He was transitioned to BiPAP due to his work of breathing, now appears more comfortable after additional DuoNebs.  EKG without evidence of arrhythmia or ischemia, patient denies any chest pain.  Labs with some hypercapnia, renal function stable compared to previous without acute electrolyte abnormality.  He does have some leukocytosis and chest x-ray concerning for pneumonia, meets sepsis criteria and was given broad-spectrum antibiotics.  Lactic acid  within normal limits, case discussed with hospitalist for admission.      FINAL CLINICAL IMPRESSION(S) / ED DIAGNOSES   Final diagnoses:  COPD exacerbation (HCC)  Pneumonia due to infectious organism, unspecified laterality, unspecified part of lung  Sepsis with acute hypercapnic respiratory failure without septic shock, due to unspecified organism [facility])     Rx / DC Orders   ED Discharge Orders     None        Note:  This document was prepared using Dragon voice recognition software and may include unintentional dictation errors.   Willo Dunnings, MD 02/05/24 [phone]

## 2024-02-05 NOTE — H&P (Incomplete)
 [place]   PATIENT NAME: [name]    MR#:  [id number]  DATE OF BIRTH:  [birth year]  DATE OF ADMISSION:  02/05/2024  PRIMARY CARE PHYSICIAN: [facility]   Patient is coming from: [facility]  REQUESTING/REFERRING PHYSICIAN: Willo Dunnings MD  CHIEF COMPLAINT:   Chief Complaint  Patient presents with   Cough   Shortness of Breath    HISTORY OF PRESENT ILLNESS:  [name]. is a 89 y.o. Caucasian male with medical history significant for anxiety, depression, coronary artery disease, type 2 diabetes mellitus, hypertension, dyslipidemia and PTSD, who presented to the ER with acute onset of worsening dyspnea since Tuesday with associated congested cough with inability to expectorate as well as wheezing, tactile fever and chills.  He was diagnosed with pneumonia on Wednesday at his SNF and was given p.o. doxycycline .  He denied any chest pain or palpitations.  No dysuria, oliguria or hematuria or flank pain.  He is incontinent at baseline.  No nausea or vomiting or abdominal pain.  No melena or bright red bleeding per rectum.  No other bleeding diathesis. He was in significant respiratory distress upon arrival to ED that he had to be placed on BiPAP.  ED Course: When the patient came to the ER, BP was 159/65 with a heart rate of 104 and respiratory rate of 34.  Possibly was up to 100% on 30% FiO2 on BiPAP.  VBG showed pH 7.26 and pCO2 62 pO2 51 and HCO3 28.5.  CMP revealed BUN of 35 and creatinine 1.84 and calcium  8.2 with albumin of 3.1 and total bili of 5.8.  proBNP was 2383.  Lactic acid was 1.0 and later 1.1.  CBC showed leukocytosis of 14.1 with neutrophilia and hemoglobin was 10.5 and hematocrit 35.5 better than previous levels with platelets 136.  PT and INR were normal.  Respiratory panel came back negative.  Blood cultures were drawn. EKG as reviewed by me :  EKG showed sinus tachycardia with rate 103 with Q waves inferiorly. Imaging:  Portable chest x-ray showed stable cardiomegaly and left retrocardiac opacity likely atelectatic with small left pleural effusion.  The patient was given DuoNeb and IV cefepime.  He will be admitted to a telemetry bed for further evaluation and management. PAST MEDICAL HISTORY:   Past Medical History:  Diagnosis Date   Agent orange exposure 1966 or 1971   Anemia    Anxiety    Arthritis    hands and back (01/20/2013)   CAD (coronary artery disease)    native vessel   Carotid artery disease    nonobstructive   Cataract    Cecal diverticulitis 2008   drained   Cellulitis, gluteal    bilateral for the past 6 months/notes 01/20/2013   Chronic lower back pain    Chronic renal insufficiency    Depression    Diabetes mellitus type II    Exertional shortness of breath    HTN (hypertension)    Hyperlipidemia    Hypokalemia    Malnutrition    protein-calorie   Myocardial infarction (HCC)    silent; before OHS (01/20/2013)   PTSD (post-traumatic stress disorder)    Spina bifida (HCC)    Urinary incontinence     PAST SURGICAL HISTORY:   Past Surgical History:  Procedure Laterality Date   APPLICATION OF WOUND VAC Right 04/08/2019   Procedure: APPLICATION OF WOUND VAC;  Surgeon: Janit Thresa HERO, DPM;  Location: [facility];  Service: Podiatry;  Laterality: Right;   BONE BIOPSY Right 04/08/2019   Procedure: BONE BIOPSY;  Surgeon: Janit Thresa HERO, DPM;  Location: [facility];  Service: Podiatry;  Laterality: Right;   CARDIAC CATHETERIZATION  1998   couple before my OHS (01/20/2013)   CARPAL TUNNEL RELEASE Right 1980's   CATARACT EXTRACTION W/ INTRAOCULAR LENS  IMPLANT, BILATERAL Bilateral ~ 2010   CORONARY ARTERY BYPASS GRAFT  1998   CABG X4 (01/20/2013)   CYSTECTOMY  2000's   cytal cyst on my intestines; probed then drained it; hospitalized for 13 days; NPO (01/20/2013)   EYE SURGERY     HEMORRHOID BANDING  ~  10/2012   IR FLUORO GUIDE CV LINE RIGHT  04/01/2019   IR US  GUIDE VASC ACCESS RIGHT  04/01/2019   KNEE SURGERY Left 1964   exploratory; sewed it up w/wire (01/20/2013)   LOWER EXTREMITY ANGIOGRAPHY Right 03/22/2019   Procedure: LOWER EXTREMITY ANGIOGRAPHY;  Surgeon: Jama Cordella MATSU, MD;  Location: [facility];  Service: Cardiovascular;  Laterality: Right;   LOWER EXTREMITY ANGIOGRAPHY Left 03/29/2019   Procedure: LOWER EXTREMITY ANGIOGRAPHY;  Surgeon: Jama Cordella MATSU, MD;  Location: [facility];  Service: Cardiovascular;  Laterality: Left;   PROSTATE SURGERY     SPINE SURGERY  11/24/1932   Spina bifida surgery   STRABISMUS SURGERY Bilateral 03/29/2015   Procedure: REPAIR STRABISMUS BILATERAL;  Surgeon: Glendale Blanch, MD;  Location: [facility];  Service: Ophthalmology;  Laterality: Bilateral;   WOUND DEBRIDEMENT Right 04/08/2019   Procedure: DEBRIDEMENT WOUND;  Surgeon: Janit Thresa HERO, DPM;  Location: [facility];  Service: Podiatry;  Laterality: Right;    SOCIAL HISTORY:   Social History   Tobacco Use   Smoking status: Former    Current packs/day: 0.00    Average packs/day: 2.0 packs/day for 31.7 years (63.5 ttl pk-yrs)    Types: Cigarettes    Start date: 11/20/1948    Quit date: 08/19/1980    Years since quitting: 43.4   Smokeless tobacco: Former    Types: Chew   Tobacco comments:    01/20/2013 quit chewing 20 yr ago  Substance Use Topics   Alcohol  use: No    Comment: 01/20/2013 quit drinking 08/19/1980    FAMILY HISTORY:   Family History  Problem Relation Age of Onset   Heart disease Mother    Heart attack Mother    Heart disease Father    Heart attack Father    Cancer Sister        breast   Cancer Sister        lung and ovarian    DRUG ALLERGIES:  Allergies[1]  REVIEW OF SYSTEMS:   ROS As per history of present illness. All pertinent systems were reviewed above. Constitutional, HEENT,  cardiovascular, respiratory, GI, GU, musculoskeletal, neuro, psychiatric, endocrine, integumentary and hematologic systems were reviewed and are otherwise negative/unremarkable except for positive findings mentioned above in the HPI.   MEDICATIONS AT HOME:   Prior to Admission medications  Medication Sig Start Date End Date Taking? Authorizing Provider  calcium -vitamin D  (OSCAL WITH D) 500-5 MG-MCG tablet Take 1 tablet by mouth 2 (two) times daily. 05/15/23  Yes Darci Pore, MD  Coenzyme Q10 (CO Q-10) 100 MG CAPS Take 1 capsule by mouth daily. 01/29/22  Yes [provider]  doxycycline  (VIBRAMYCIN ) 100 MG capsule Take 100 mg by mouth 2 (two) times daily.   Yes [provider]  ferrous  sulfate 300 (60 Fe) MG/5ML syrup Take 300 mg by mouth in the morning and at bedtime. 06/23/23  Yes [provider]  fluticasone  (FLONASE) 50 MCG/ACT nasal spray Place 2 sprays into both nostrils daily. 02/16/23  Yes [provider]  Fluticasone -Umeclidin-Vilant (TRELEGY ELLIPTA ) 100-62.5-25 MCG/ACT AEPB Inhale 1 puff into the lungs daily. 10/01/21  Yes Tamea Dedra CROME, MD  GENERLAC  10 GM/15ML SOLN Take 30 mLs by mouth every Monday, Wednesday, and Friday. 01/10/20  Yes [provider]  guaiFENesin  (MUCINEX ) 600 MG 12 hr tablet Take 600 mg by mouth every 12 (twelve) hours as needed. 12/19/22  Yes [provider]  JANUVIA  25 MG tablet Take 25 mg by mouth daily. 08/13/22  Yes [provider]  lidocaine  4 % Place 1 patch onto the skin daily. Apply topically to left chest. Remove at bedtime.   Yes [provider]  metoprolol  tartrate (LOPRESSOR ) 25 MG tablet Take 0.5 tablets (12.5 mg total) by mouth 2 (two) times daily. Patient taking differently: Take 12.5 mg by mouth daily. 12/15/18  Yes Dettinger, Fonda LABOR, MD  polyethylene glycol powder (GLYCOLAX /MIRALAX ) 17 GM/SCOOP powder Take 17 g by mouth daily. 07/26/21  Yes Dorothyann Drivers, MD   pregabalin  (LYRICA ) 50 MG capsule Take 50 mg by mouth 2 (two) times daily.   Yes [provider]  senna (SENOKOT) 8.6 MG tablet Take 2 tablets by mouth 2 (two) times daily.   Yes [provider]  acetaminophen  (TYLENOL ) 325 MG tablet Take 650 mg by mouth every 6 (six) hours as needed. 01/21/22   [provider]  albuterol  (VENTOLIN  HFA) 108 (90 Base) MCG/ACT inhaler Inhale 2 puffs into the lungs every 6 (six) hours as needed for wheezing or shortness of breath. 10/01/21   Tamea Dedra CROME, MD  ARTIFICIAL TEARS 0.2-0.2-1 % SOLN Apply to eye 3 (three) times daily. Instill 2 drops into each eye 3 times daily. 02/20/20   [provider]  Ashwagandha 300 MG TABS Take 1 capsule by mouth at bedtime. Take 460 mg by mouth at bedtime. 04/29/22   [provider]  aspirin  81 MG tablet Take 81 mg by mouth daily.    [provider]  buPROPion  (WELLBUTRIN  XL) 150 MG 24 hr tablet Take 150 mg by mouth daily.    [provider]  carboxymethylcellulose (REFRESH PLUS) 0.5 % SOLN Place 1 drop into both eyes QID.    [provider]  clopidogrel  (PLAVIX ) 75 MG tablet Take 1 tablet (75 mg total) by mouth daily. 03/23/19   Schnier, Cordella MATSU, MD  diclofenac Sodium (VOLTAREN) 1 % GEL Apply topically. APPLY 2 GRAMS TOPICALLY TO EACH SHOULDER EVERY 6 HOURS AS NEEDED 08/08/22   [provider]  docusate sodium  (COLACE) 100 MG capsule Take 100 mg by mouth 2 (two) times daily.    [provider]  famotidine  (PEPCID ) 10 MG tablet Take 10 mg by mouth daily.    [provider]  faricimab-svoa (VABYSMO) 6 MG/0.05ML SOSY intravitreal injection 6 mg by Intravitreal route 3 (three) times daily. Administered by the [facility] 3 times a day on Friday every 5 weeks.    [provider]  Insulin  Glargine (BASAGLAR KWIKPEN) 100 UNIT/ML Inject 10 Units into the skin at bedtime.    [provider]  insulin  lispro (HUMALOG) 100 UNIT/ML  KwikPen Inject subcutaneously as directed per sliding scale. 01/31/23   [provider]  ipratropium-albuterol  (DUONEB) 0.5-2.5 (3) MG/3ML SOLN 1 NEBULIZER SOLUTION ( ) BY ORAL INHALATION  EVERY 4 TO 6 HOURS AS NEEDED 12/13/21   [provider]  lactulose  (CHRONULAC ) 10 GM/15ML solution Take 20 g by mouth 2 (two) times daily. 12/12/22   [provider]  lidocaine  (LINDAMANTLE) 3 % CREA cream Apply 1 Application topically as needed. Apply to left shoulder every 6 hours as needed along with diclofenac gel. 05/20/22   [provider]  Multiple Vitamins-Minerals (PRESERVISION AREDS 2 PO) Take 1 capsule by mouth 2 (two) times daily.    [provider]  nystatin  (MYCOSTATIN /NYSTOP ) powder Apply topically 2 (two) times daily. Apply to groin and perineum twice a day Patient taking differently: Apply topically 3 (three) times daily. Apply to groin and perineum every shift 03/06/20   Jens Durand, MD  pregabalin  (LYRICA ) 25 MG capsule Take 25 mg by mouth daily as needed.    [provider]  rosuvastatin  (CRESTOR ) 10 MG tablet Take 10 mg by mouth at bedtime. 08/13/22   [provider]  senna-docusate (SENOKOT-S) 8.6-50 MG tablet Take 1 tablet by mouth at bedtime as needed for mild constipation. 02/19/22   Briana Joya BIRCH, MD  simethicone  (GAS-X) 80 MG chewable tablet Chew 1 tablet (80 mg total) by mouth 4 (four) times daily as needed for flatulence. 04/28/23 04/27/24  Viviann Pastor, MD  Sodium Phosphates (FLEET SALINE ENEMA) 7-19 GM/197ML ENEM Place rectally once.    [provider]      VITAL SIGNS:  Blood pressure (!) 117/52, pulse 96, temperature 99.8 F (37.7 C), temperature source Oral, resp. rate (!) 24, height 5' 8 (1.727 m), weight 99.8 kg, SpO2 100%.  PHYSICAL EXAMINATION:  Physical Exam  GENERAL: Acutely ill 89 y.o.-year-old Caucasian male patient lying in the bed in moderate respiratory distress on BiPAP. EYES: Pupils  equal, round, reactive to light and accommodation. No scleral icterus. Extraocular muscles intact.  HEENT: Head atraumatic, normocephalic. Oropharynx and nasopharynx clear.  NECK:  Supple, no jugular venous distention. No thyroid  enlargement, no tenderness.  LUNGS: Diminished bibasilar breath sounds with left midlung zone crackles. No use of accessory muscles of respiration.  CARDIOVASCULAR: Regular rate and rhythm, S1, S2 normal. No murmurs, rubs, or gallops.  ABDOMEN: Soft, nondistended, nontender. Bowel sounds present. No organomegaly or mass.  EXTREMITIES: He is status post right AKA with intact stump.  He has left lower extremity on 2+ pitting edema with no cyanosis, or clubbing.  NEUROLOGIC: Cranial nerves II through XII are intact. Muscle strength 5/5 in all extremities. Sensation intact. Gait not checked.  PSYCHIATRIC: The patient is alert and oriented x 3.  Normal affect and good eye contact. SKIN: No obvious rash, lesion, or ulcer.   LABORATORY PANEL:   CBC Recent Labs  Lab 02/05/24 1741  WBC 14.1*  HGB 10.5*  HCT 34.5*  PLT 136*   ------------------------------------------------------------------------------------------------------------------  Chemistries  Recent Labs  Lab 02/05/24 1741  NA 138  K 4.7  CL 101  CO2 27  GLUCOSE 182*  BUN 35*  CREATININE 1.84*  CALCIUM  8.2*  AST 15  ALT 6  ALKPHOS 75  BILITOT 0.3   ------------------------------------------------------------------------------------------------------------------  Cardiac Enzymes No results for input(s): TROPONINI in the last 168 hours. ------------------------------------------------------------------------------------------------------------------  RADIOLOGY:  DG Chest Port 1 View if patient is in a treatment room. Result Date: 02/05/2024 EXAM: 1 VIEW(S) XRAY OF THE CHEST 02/05/2024 05:50:00 PM COMPARISON: 02/06/2023 CLINICAL HISTORY: Suspected Sepsis FINDINGS: LUNGS AND PLEURA: Low lung  volumes with vascular crowding. Left retrocardiac opacity, likely atelectasis. Left pleural effusion. No pneumothorax. HEART AND MEDIASTINUM:  Stable cardiomegaly. Aortic atherosclerotic calcification. Status post CABG. BONES AND SOFT TISSUES: Status post median sternotomy. IMPRESSION: 1. Left retrocardiac opacity, likely atelectasis, with small left pleural effusion. 2. Stable cardiomegaly. Electronically signed by: Pinkie Pebbles MD 02/05/2024 06:43 PM EST RP Workstation: [id number]      IMPRESSION AND PLAN:  Assessment and Plan: No notes have been filed under this hospital service. Service: Hospitalist      DVT prophylaxis: Lovenox ***  Advanced Care Planning:  Code Status: full code***  Family Communication:  The plan of care was discussed in details with the patient (and family). I answered all questions. The patient agreed to proceed with the above mentioned plan. Further management will depend upon hospital course. Disposition Plan: Back to previous home environment Consults called: none***  All the records are reviewed and case discussed with ED provider.  Status is: Inpatient {Inpatient:23812}   At the time of the admission, it appears that the appropriate admission status for this patient is inpatient.  This is judged to be reasonable and necessary in order to provide the required intensity of service to ensure the patient's safety given the presenting symptoms, physical exam findings and initial radiographic and laboratory data in the context of comorbid conditions.  The patient requires inpatient status due to high intensity of service, high risk of further deterioration and high frequency of surveillance required.  I certify that at the time of admission, it is my clinical judgment that the patient will require inpatient hospital care extending more than 2 midnights.                            Dispo: The patient is from: [facility]              Anticipated d/c is  to: [facility]              Patient currently is not medically stable to d/c.              Difficult to place patient: No  Madison DELENA Peaches M.D on 02/05/2024 at 10:16 PM  [facility]   From 7 PM-7 AM, contact night-coverage [email or web address]  CC: Primary care physician; [facility]        [1] Allergies Allergen Reactions   Sulfonamide Derivatives     REACTION: pruitis patient cant remember its been so long     Aspirin  Rash    In high doses   Nitrofurantoin Rash   Penicillins Rash    Did it involve swelling of the face/tongue/throat, SOB, or low BP? No Did it involve sudden or severe rash/hives, skin peeling, or any reaction on the inside of your mouth or nose? No Did you need to seek medical attention at a hospital or doctor's office? No When did it last happen?      Teenager If all above answers are NO, may proceed with cephalosporin use.   Sulfa  Antibiotics Rash

## 2024-02-05 NOTE — ED Triage Notes (Signed)
 Pt BIB EMS from [facility]. Pt was there for abx due to pneumonia. Pt was complaining of SOB and non-productive cough.

## 2024-02-05 NOTE — H&P (Signed)
 [place]   PATIENT NAME: [name]    MR#:  [id number]  DATE OF BIRTH:  03-Jul-1930  DATE OF ADMISSION:  02/05/2024  PRIMARY CARE PHYSICIAN: [facility]   Patient is coming from: [facility]  REQUESTING/REFERRING PHYSICIAN: Willo Dunnings MD  CHIEF COMPLAINT:   Chief Complaint  Patient presents with   Cough   Shortness of Breath    HISTORY OF PRESENT ILLNESS:  [name]. is a 89 y.o. Caucasian male with medical history significant for anxiety, depression, coronary artery disease, type 2 diabetes mellitus, hypertension, dyslipidemia and PTSD, who presented to the ER with acute onset of worsening dyspnea since Tuesday with associated congested cough with inability to expectorate as well as wheezing, tactile fever and chills.  He was diagnosed with pneumonia on Wednesday at his SNF and was given p.o. doxycycline .  He denied any chest pain or palpitations.  No dysuria, oliguria or hematuria or flank pain.  He is incontinent at baseline.  No nausea or vomiting or abdominal pain.  No melena or bright red bleeding per rectum.  No other bleeding diathesis. He was in significant respiratory distress upon arrival to ED that he had to be placed on BiPAP.  ED Course: When the patient came to the ER, BP was 159/65 with a heart rate of 104 and respiratory rate of 34.  Possibly was up to 100% on 30% FiO2 on BiPAP.  VBG showed pH 7.26 and pCO2 62 pO2 51 and HCO3 28.5.  CMP revealed BUN of 35 and creatinine 1.84 and calcium  8.2 with albumin of 3.1 and total bili of 5.8.  proBNP was 2383.  Lactic acid was 1.0 and later 1.1.  CBC showed leukocytosis of 14.1 with neutrophilia and hemoglobin was 10.5 and hematocrit 35.5 better than previous levels with platelets 136.  PT and INR were normal.  Respiratory panel came back negative.  Blood cultures were drawn. EKG as reviewed by me :  EKG showed sinus tachycardia with rate 103 with Q waves inferiorly. Imaging:  Portable chest x-ray showed stable cardiomegaly and left retrocardiac opacity likely atelectatic with small left pleural effusion.  The patient was given DuoNeb and IV cefepime .  He will be admitted to a telemetry bed for further evaluation and management. PAST MEDICAL HISTORY:   Past Medical History:  Diagnosis Date   Agent orange exposure 1966 or 1971   Anemia    Anxiety    Arthritis    hands and back (01/20/2013)   CAD (coronary artery disease)    native vessel   Carotid artery disease    nonobstructive   Cataract    Cecal diverticulitis 2008   drained   Cellulitis, gluteal    bilateral for the past 6 months/notes 01/20/2013   Chronic lower back pain    Chronic renal insufficiency    Depression    Diabetes mellitus type II    Exertional shortness of breath    HTN (hypertension)    Hyperlipidemia    Hypokalemia    Malnutrition    protein-calorie   Myocardial infarction (HCC)    silent; before OHS (01/20/2013)   PTSD (post-traumatic stress disorder)    Spina bifida (HCC)    Urinary incontinence     PAST SURGICAL HISTORY:   Past Surgical History:  Procedure Laterality Date   APPLICATION OF WOUND VAC Right 04/08/2019   Procedure: APPLICATION OF WOUND VAC;  Surgeon: Janit Thresa HERO, DPM;  Location: [facility];  Service: Podiatry;  Laterality: Right;   BONE BIOPSY Right 04/08/2019   Procedure: BONE BIOPSY;  Surgeon: Janit Thresa HERO, DPM;  Location: [facility];  Service: Podiatry;  Laterality: Right;   CARDIAC CATHETERIZATION  1998   couple before my OHS (01/20/2013)   CARPAL TUNNEL RELEASE Right 1980's   CATARACT EXTRACTION W/ INTRAOCULAR LENS  IMPLANT, BILATERAL Bilateral ~ 2010   CORONARY ARTERY BYPASS GRAFT  1998   CABG X4 (01/20/2013)   CYSTECTOMY  2000's   cytal cyst on my intestines; probed then drained it; hospitalized for 13 days; NPO (01/20/2013)   EYE SURGERY     HEMORRHOID BANDING  ~ 10/2012   IR FLUORO GUIDE CV LINE  RIGHT  04/01/2019   IR US  GUIDE VASC ACCESS RIGHT  04/01/2019   KNEE SURGERY Left 1964   exploratory; sewed it up w/wire (01/20/2013)   LOWER EXTREMITY ANGIOGRAPHY Right 03/22/2019   Procedure: LOWER EXTREMITY ANGIOGRAPHY;  Surgeon: Jama Cordella MATSU, MD;  Location: [facility];  Service: Cardiovascular;  Laterality: Right;   LOWER EXTREMITY ANGIOGRAPHY Left 03/29/2019   Procedure: LOWER EXTREMITY ANGIOGRAPHY;  Surgeon: Jama Cordella MATSU, MD;  Location: [facility];  Service: Cardiovascular;  Laterality: Left;   PROSTATE SURGERY     SPINE SURGERY  11/24/1930   Spina bifida surgery   STRABISMUS SURGERY Bilateral 03/29/2015   Procedure: REPAIR STRABISMUS BILATERAL;  Surgeon: Glendale Blanch, MD;  Location: [facility];  Service: Ophthalmology;  Laterality: Bilateral;   WOUND DEBRIDEMENT Right 04/08/2019   Procedure: DEBRIDEMENT WOUND;  Surgeon: Janit Thresa HERO, DPM;  Location: [facility];  Service: Podiatry;  Laterality: Right;    SOCIAL HISTORY:   Social History   Tobacco Use   Smoking status: Former    Current packs/day: 0.00    Average packs/day: 2.0 packs/day for 31.7 years (63.5 ttl pk-yrs)    Types: Cigarettes    Start date: 11/20/1948    Quit date: 08/19/1980    Years since quitting: 43.4   Smokeless tobacco: Former    Types: Chew   Tobacco comments:    01/20/2013 quit chewing 20 yr ago  Substance Use Topics   Alcohol  use: No    Comment: 01/20/2013 quit drinking 08/19/1980    FAMILY HISTORY:   Family History  Problem Relation Age of Onset   Heart disease Mother    Heart attack Mother    Heart disease Father    Heart attack Father    Cancer Sister        breast   Cancer Sister        lung and ovarian    DRUG ALLERGIES:  Allergies[1]  REVIEW OF SYSTEMS:   ROS As per history of present illness. All pertinent systems were reviewed above. Constitutional, HEENT, cardiovascular, respiratory, GI, GU, musculoskeletal, neuro,  psychiatric, endocrine, integumentary and hematologic systems were reviewed and are otherwise negative/unremarkable except for positive findings mentioned above in the HPI.   MEDICATIONS AT HOME:   Prior to Admission medications  Medication Sig Start Date End Date Taking? Authorizing Provider  calcium -vitamin D  (OSCAL WITH D) 500-5 MG-MCG tablet Take 1 tablet by mouth 2 (two) times daily. 05/15/23  Yes Darci Pore, MD  Coenzyme Q10 (CO Q-10) 100 MG CAPS Take 1 capsule by mouth daily. 01/29/22  Yes [provider]  doxycycline  (VIBRAMYCIN ) 100 MG capsule Take 100 mg by mouth 2 (two) times daily.   Yes [provider]  ferrous  sulfate 300 (60 Fe) MG/5ML syrup Take 300 mg by mouth in the morning and at bedtime. 06/23/23  Yes [provider]  fluticasone  (FLONASE ) 50 MCG/ACT nasal spray Place 2 sprays into both nostrils daily. 02/16/23  Yes [provider]  Fluticasone -Umeclidin-Vilant (TRELEGY ELLIPTA ) 100-62.5-25 MCG/ACT AEPB Inhale 1 puff into the lungs daily. 10/01/21  Yes Tamea Dedra CROME, MD  GENERLAC  10 GM/15ML SOLN Take 30 mLs by mouth every Monday, Wednesday, and Friday. 01/10/20  Yes [provider]  guaiFENesin  (MUCINEX ) 600 MG 12 hr tablet Take 600 mg by mouth every 12 (twelve) hours as needed. 12/19/22  Yes [provider]  JANUVIA  25 MG tablet Take 25 mg by mouth daily. 08/13/22  Yes [provider]  lidocaine  4 % Place 1 patch onto the skin daily. Apply topically to left chest. Remove at bedtime.   Yes [provider]  metoprolol  tartrate (LOPRESSOR ) 25 MG tablet Take 0.5 tablets (12.5 mg total) by mouth 2 (two) times daily. Patient taking differently: Take 12.5 mg by mouth daily. 12/15/18  Yes Dettinger, Fonda LABOR, MD  polyethylene glycol powder (GLYCOLAX /MIRALAX ) 17 GM/SCOOP powder Take 17 g by mouth daily. 07/26/21  Yes Dorothyann Drivers, MD  pregabalin  (LYRICA ) 50 MG capsule Take 50 mg by mouth 2 (two)  times daily.   Yes [provider]  senna (SENOKOT) 8.6 MG tablet Take 2 tablets by mouth 2 (two) times daily.   Yes [provider]  acetaminophen  (TYLENOL ) 325 MG tablet Take 650 mg by mouth every 6 (six) hours as needed. 01/21/22   [provider]  albuterol  (VENTOLIN  HFA) 108 (90 Base) MCG/ACT inhaler Inhale 2 puffs into the lungs every 6 (six) hours as needed for wheezing or shortness of breath. 10/01/21   Tamea Dedra CROME, MD  ARTIFICIAL TEARS 0.2-0.2-1 % SOLN Apply to eye 3 (three) times daily. Instill 2 drops into each eye 3 times daily. 02/20/20   [provider]  Ashwagandha 300 MG TABS Take 1 capsule by mouth at bedtime. Take 460 mg by mouth at bedtime. 04/29/22   [provider]  aspirin  81 MG tablet Take 81 mg by mouth daily.    [provider]  buPROPion  (WELLBUTRIN  XL) 150 MG 24 hr tablet Take 150 mg by mouth daily.    [provider]  carboxymethylcellulose (REFRESH PLUS) 0.5 % SOLN Place 1 drop into both eyes QID.    [provider]  clopidogrel  (PLAVIX ) 75 MG tablet Take 1 tablet (75 mg total) by mouth daily. 03/23/19   Schnier, Cordella MATSU, MD  diclofenac  Sodium (VOLTAREN ) 1 % GEL Apply topically. APPLY 2 GRAMS TOPICALLY TO EACH SHOULDER EVERY 6 HOURS AS NEEDED 08/08/22   [provider]  docusate sodium  (COLACE) 100 MG capsule Take 100 mg by mouth 2 (two) times daily.    [provider]  famotidine  (PEPCID ) 10 MG tablet Take 10 mg by mouth daily.    [provider]  faricimab-svoa (VABYSMO) 6 MG/0.05ML SOSY intravitreal injection 6 mg by Intravitreal route 3 (three) times daily. Administered by the [facility] 3 times a day on Friday every 5 weeks.    [provider]  Insulin  Glargine (BASAGLAR  KWIKPEN) 100 UNIT/ML Inject 10 Units into the skin at bedtime.    [provider]  insulin  lispro (HUMALOG) 100 UNIT/ML KwikPen Inject subcutaneously as directed per sliding scale.  01/31/23   [provider]  ipratropium-albuterol  (DUONEB) 0.5-2.5 (3) MG/3ML SOLN 1 NEBULIZER SOLUTION ( ) BY ORAL INHALATION  EVERY 4 TO 6 HOURS AS NEEDED 12/13/21   [provider]  lactulose  (CHRONULAC ) 10 GM/15ML solution Take 20 g by mouth 2 (two) times daily. 12/12/22   [provider]  lidocaine  (LINDAMANTLE) 3 % CREA cream Apply 1 Application topically as needed. Apply to left shoulder every 6 hours as needed along with diclofenac  gel. 05/20/22   [provider]  Multiple Vitamins-Minerals (PRESERVISION AREDS 2 PO) Take 1 capsule by mouth 2 (two) times daily.    [provider]  nystatin  (MYCOSTATIN /NYSTOP ) powder Apply topically 2 (two) times daily. Apply to groin and perineum twice a day Patient taking differently: Apply topically 3 (three) times daily. Apply to groin and perineum every shift 03/06/20   Jens Durand, MD  pregabalin  (LYRICA ) 25 MG capsule Take 25 mg by mouth daily as needed.    [provider]  rosuvastatin  (CRESTOR ) 10 MG tablet Take 10 mg by mouth at bedtime. 08/13/22   [provider]  senna-docusate (SENOKOT-S) 8.6-50 MG tablet Take 1 tablet by mouth at bedtime as needed for mild constipation. 02/19/22   Briana Joya BIRCH, MD  simethicone  (GAS-X) 80 MG chewable tablet Chew 1 tablet (80 mg total) by mouth 4 (four) times daily as needed for flatulence. 04/28/23 04/27/24  Viviann Pastor, MD  Sodium Phosphates (FLEET SALINE ENEMA) 7-19 GM/197ML ENEM Place rectally once.    [provider]      VITAL SIGNS:  Blood pressure (!) 138/46, pulse 93, temperature 98.9 F (37.2 C), temperature source Oral, resp. rate (!) 22, height 5' 8 (1.727 m), weight 99.8 kg, SpO2 100%.  PHYSICAL EXAMINATION:  Physical Exam  GENERAL: Acutely ill 89 y.o.-year-old Caucasian male patient lying in the bed in moderate respiratory distress on BiPAP. EYES: Pupils equal, round, reactive to light and accommodation. No scleral  icterus. Extraocular muscles intact.  HEENT: Head atraumatic, normocephalic. Oropharynx and nasopharynx clear.  NECK:  Supple, no jugular venous distention. No thyroid  enlargement, no tenderness.  LUNGS: Diminished bibasilar breath sounds with left midlung zone crackles. No use of accessory muscles of respiration.  CARDIOVASCULAR: Regular rate and rhythm, S1, S2 normal. No murmurs, rubs, or gallops.  ABDOMEN: Soft, nondistended, nontender. Bowel sounds present. No organomegaly or mass.  EXTREMITIES: He is status post right AKA with intact stump.  He has left lower extremity on 2+ pitting edema with no cyanosis, or clubbing.  NEUROLOGIC: Cranial nerves II through XII are intact. Muscle strength 5/5 in all extremities. Sensation intact. Gait not checked.  PSYCHIATRIC: The patient is alert and oriented x 3.  Normal affect and good eye contact. SKIN: No obvious rash, lesion, or ulcer.   LABORATORY PANEL:   CBC Recent Labs  Lab 02/05/24 1741  WBC 14.1*  HGB 10.5*  HCT 34.5*  PLT 136*   ------------------------------------------------------------------------------------------------------------------  Chemistries  Recent Labs  Lab 02/05/24 1741  NA 138  K 4.7  CL 101  CO2 27  GLUCOSE 182*  BUN 35*  CREATININE 1.84*  CALCIUM  8.2*  AST 15  ALT 6  ALKPHOS 75  BILITOT 0.3   ------------------------------------------------------------------------------------------------------------------  Cardiac Enzymes No results for input(s): TROPONINI in the last 168 hours. ------------------------------------------------------------------------------------------------------------------  RADIOLOGY:  DG Chest Port 1 View if patient is in a treatment room. Result Date: 02/05/2024 EXAM: 1 VIEW(S) XRAY OF THE CHEST 02/05/2024 05:50:00 PM COMPARISON: 02/06/2023 CLINICAL HISTORY: Suspected Sepsis FINDINGS: LUNGS AND PLEURA: Low lung volumes with vascular crowding. Left retrocardiac opacity,  likely atelectasis. Left pleural effusion. No pneumothorax. HEART AND MEDIASTINUM:  Stable cardiomegaly. Aortic atherosclerotic calcification. Status post CABG. BONES AND SOFT TISSUES: Status post median sternotomy. IMPRESSION: 1. Left retrocardiac opacity, likely atelectasis, with small left pleural effusion. 2. Stable cardiomegaly. Electronically signed by: Pinkie Pebbles MD 02/05/2024 06:43 PM EST RP Workstation: [id number]      IMPRESSION AND PLAN:  Assessment and Plan: * Acute respiratory failure (HCC) - This is associated with hypoxia and hypercarbia. - This is secondary to pneumonia with associated sepsis. - The patient will be admitted to a progressive unit bed. - Will continue the patient on BiPAP. - Will continue antibiotic therapy with IV Rocephin  and Zithromax . - Mucolytic therapy be provided as well as duo nebs q.i.d. and q.4 hours p.r.n. - We will follow blood cultures.   Sepsis due to pneumonia [facility]) - This is manifested by leukocytosis, tachycardia and tachypnea. - Management as above. - We will continue cautious hydration with IV lactated ringer  given elevated proBNP. - The patient's oxygenation has been improving as well as his respiratory distress.  Dyslipidemia - Continue statin therapy.  Depression - Will continue Wellbutrin  XL  Essential hypertension - Will continue antihypertensive therapy.  Type 2 diabetes mellitus with chronic kidney disease, with long-term current use of insulin  (HCC) - The patient will be placed on supplement coverage with NovoLog . - Will continue basal coverage. - Will continue Januvia . - Will continue Lyrica  for diabetic neuropathy.    DVT prophylaxis: Lovenox . Advanced Care Planning:  Code Status: The patient is DNR and DNI.  This was confirmed. Family Communication:  The plan of care was discussed in details with the patient (and family). I answered all questions. The patient agreed to proceed with the above mentioned plan.  Further management will depend upon hospital course. Disposition Plan: Back to previous home environment Consults called: none.  All the records are reviewed and case discussed with ED provider.  Status is: Inpatient   At the time of the admission, it appears that the appropriate admission status for this patient is inpatient.  This is judged to be reasonable and necessary in order to provide the required intensity of service to ensure the patient's safety given the presenting symptoms, physical exam findings and initial radiographic and laboratory data in the context of comorbid conditions.  The patient requires inpatient status due to high intensity of service, high risk of further deterioration and high frequency of surveillance required.  I certify that at the time of admission, it is my clinical judgment that the patient will require inpatient hospital care extending more than 2 midnights.                            Dispo: The patient is from: [facility]              Anticipated d/c is to: [facility]              Patient currently is not medically stable to d/c.              Difficult to place patient: No  Authorized and performed by: Madison Peaches, MD Total critical care time:    60    minutes. Due to a high probability of clinically significant, life-threatening deterioration, the patient required my highest level of preparedness to intervene emergently and I personally spent this critical care time directly and personally managing the patient.  This critical care time included obtaining a history, examining the patient, pulse oximetry, ordering and  review of studies, arranging urgent treatment with development of management plan, evaluation of patient's response to treatment, frequent reassessment, and discussions with other providers. This critical care time was performed to assess and manage the high probability of imminent, life-threatening deterioration that could result  in multiorgan failure.  It was exclusive of separately billable procedures and treating other patients and teaching time.   Madison DELENA Peaches M.D on 02/06/2024 at 12:53 AM  [facility]   From 7 PM-7 AM, contact night-coverage [email or web address]  CC: Primary care physician; [facility]      [1]  Allergies Allergen Reactions   Sulfonamide Derivatives     REACTION: pruitis patient cant remember its been so long     Aspirin  Rash    In high doses   Nitrofurantoin Rash   Penicillins Rash    Did it involve swelling of the face/tongue/throat, SOB, or low BP? No Did it involve sudden or severe rash/hives, skin peeling, or any reaction on the inside of your mouth or nose? No Did you need to seek medical attention at a hospital or doctor's office? No When did it last happen?      Teenager If all above answers are NO, may proceed with cephalosporin use.   Sulfa  Antibiotics Rash

## 2024-02-05 NOTE — Progress Notes (Signed)
 Pt was placed on bipap per order on the documented settings.  HR 102 BP 159/65 Sp02-100% BS audible wheezing

## 2024-02-05 NOTE — ED Notes (Signed)
 BiPAP removed due to pt having a coughing fit. Pt sats 98% on 3L [place].

## 2024-02-05 NOTE — ED Notes (Signed)
 Pt noted to have dentures in mouth with BiPAP on. Dentures removed by this RN and BiPAP reapplied.

## 2024-02-05 NOTE — Consult Note (Signed)
 CODE SEPSIS - PHARMACY COMMUNICATION  **Broad Spectrum Antibiotics should be administered within 1 hour of Sepsis diagnosis**  Time Code Sepsis Called/Page Received: 1819  Antibiotics Ordered: cefepime x1, azithromycin  x1  Time of 1st antibiotic administration: 1839  Additional action taken by pharmacy: none  If necessary, Name of Provider/Nurse Contacted: n/a    Annabella LOISE Banks ,PharmD Clinical Pharmacist  02/05/2024  6:21 PM

## 2024-02-06 ENCOUNTER — Encounter: Payer: Self-pay | Admitting: Family Medicine

## 2024-02-06 DIAGNOSIS — F32A Depression, unspecified: Secondary | ICD-10-CM | POA: Insufficient documentation

## 2024-02-06 DIAGNOSIS — J189 Pneumonia, unspecified organism: Secondary | ICD-10-CM

## 2024-02-06 DIAGNOSIS — E785 Hyperlipidemia, unspecified: Secondary | ICD-10-CM | POA: Insufficient documentation

## 2024-02-06 LAB — BASIC METABOLIC PANEL WITH GFR
Anion gap: 13 (ref 5–15)
BUN: 36 mg/dL — ABNORMAL HIGH (ref 8–23)
CO2: 21 mmol/L — ABNORMAL LOW (ref 22–32)
Calcium: 8.1 mg/dL — ABNORMAL LOW (ref 8.9–10.3)
Chloride: 102 mmol/L (ref 98–111)
Creatinine, Ser: 1.77 mg/dL — ABNORMAL HIGH (ref 0.61–1.24)
GFR, Estimated: 36 mL/min — ABNORMAL LOW (ref >60)
Glucose, Bld: 302 mg/dL — ABNORMAL HIGH (ref 70–99)
Potassium: 4.1 mmol/L (ref 3.5–5.1)
Sodium: 135 mmol/L (ref 135–145)

## 2024-02-06 LAB — CBG MONITORING, ED
Glucose-Capillary: 196 mg/dL — ABNORMAL HIGH (ref 70–99)
Glucose-Capillary: 206 mg/dL — ABNORMAL HIGH (ref 70–99)
Glucose-Capillary: 247 mg/dL — ABNORMAL HIGH (ref 70–99)
Glucose-Capillary: 299 mg/dL — ABNORMAL HIGH (ref 70–99)
Glucose-Capillary: 304 mg/dL — ABNORMAL HIGH (ref 70–99)
Glucose-Capillary: 304 mg/dL — ABNORMAL HIGH (ref 70–99)

## 2024-02-06 LAB — URINALYSIS, W/ REFLEX TO CULTURE (INFECTION SUSPECTED)
Bilirubin Urine: NEGATIVE
Glucose, UA: NEGATIVE mg/dL
Hgb urine dipstick: NEGATIVE
Ketones, ur: NEGATIVE mg/dL
Leukocytes,Ua: NEGATIVE
Nitrite: NEGATIVE
Protein, ur: NEGATIVE mg/dL
Specific Gravity, Urine: 1.009 (ref 1.005–1.030)
Squamous Epithelial / HPF: 0 /HPF (ref 0–5)
pH: 5 (ref 5.0–8.0)

## 2024-02-06 LAB — CBC
HCT: 30.9 % — ABNORMAL LOW (ref 39.0–52.0)
Hemoglobin: 9.5 g/dL — ABNORMAL LOW (ref 13.0–17.0)
MCH: 30.3 pg (ref 26.0–34.0)
MCHC: 30.7 g/dL (ref 30.0–36.0)
MCV: 98.4 fL (ref 80.0–100.0)
Platelets: 126 K/uL — ABNORMAL LOW (ref 150–400)
RBC: 3.14 MIL/uL — ABNORMAL LOW (ref 4.22–5.81)
RDW: 14.5 % (ref 11.5–15.5)
WBC: 13.3 K/uL — ABNORMAL HIGH (ref 4.0–10.5)
nRBC: 0 % (ref 0.0–0.2)

## 2024-02-06 LAB — MRSA NEXT GEN BY PCR, NASAL: MRSA by PCR Next Gen: DETECTED — AB

## 2024-02-06 LAB — PROTIME-INR
INR: 1 (ref 0.8–1.2)
Prothrombin Time: 14 s (ref 11.4–15.2)

## 2024-02-06 LAB — HEMOGLOBIN A1C
Hgb A1c MFr Bld: 6.3 % — ABNORMAL HIGH (ref 4.8–5.6)
Mean Plasma Glucose: 134.11 mg/dL

## 2024-02-06 LAB — PROCALCITONIN: Procalcitonin: 1.29 ng/mL

## 2024-02-06 LAB — GLUCOSE, CAPILLARY: Glucose-Capillary: 167 mg/dL — ABNORMAL HIGH (ref 70–99)

## 2024-02-06 MED ORDER — BUPROPION HCL ER (XL) 150 MG PO TB24: 150.0000 mg | ORAL_TABLET | Freq: Every day | ORAL | Status: DC

## 2024-02-06 MED ORDER — METOPROLOL TARTRATE 25 MG PO TABS
12.5000 mg | ORAL_TABLET | Freq: Every day | ORAL | Status: DC
  Administered 2024-02-06 – 2024-02-08 (×3): 12.5 mg via ORAL
  Filled 2024-02-06 (×3): qty 1

## 2024-02-06 MED ORDER — DICLOFENAC SODIUM 1 % EX GEL: 2.0000 g | Freq: Four times a day (QID) | CUTANEOUS | Status: DC | PRN

## 2024-02-06 MED ORDER — DOCUSATE SODIUM 50 MG/5ML PO LIQD
100.0000 mg | Freq: Two times a day (BID) | ORAL | Status: DC
  Administered 2024-02-06 – 2024-02-09 (×7): 100 mg via ORAL
  Filled 2024-02-06 (×10): qty 10

## 2024-02-06 MED ORDER — FUROSEMIDE 10 MG/ML IJ SOLN
40.0000 mg | Freq: Once | INTRAMUSCULAR | Status: AC
  Administered 2024-02-06: 40 mg via INTRAVENOUS
  Filled 2024-02-06: qty 4

## 2024-02-06 MED ORDER — LACTULOSE 10 GM/15ML PO SOLN
20.0000 g | ORAL | Status: DC
  Administered 2024-02-08: 11:00:00 20 g via ORAL
  Filled 2024-02-06 (×3): qty 30

## 2024-02-06 MED ORDER — PREGABALIN 50 MG PO CAPS
50.0000 mg | ORAL_CAPSULE | Freq: Two times a day (BID) | ORAL | Status: DC
  Administered 2024-02-06: 50 mg via ORAL
  Filled 2024-02-06: qty 1

## 2024-02-06 MED ORDER — BUPROPION HCL 75 MG PO TABS
75.0000 mg | ORAL_TABLET | Freq: Two times a day (BID) | ORAL | Status: DC
  Administered 2024-02-06 – 2024-02-12 (×13): 75 mg via ORAL
  Filled 2024-02-06 (×13): qty 1

## 2024-02-06 MED ORDER — FLUTICASONE PROPIONATE 50 MCG/ACT NA SUSP
2.0000 | Freq: Every day | NASAL | Status: DC
  Administered 2024-02-06 – 2024-02-12 (×7): 2 via NASAL
  Filled 2024-02-06: qty 16

## 2024-02-06 MED ORDER — GERHARDT'S BUTT CREAM
TOPICAL_CREAM | Freq: Two times a day (BID) | CUTANEOUS | Status: DC
  Filled 2024-02-06: qty 60

## 2024-02-06 MED ORDER — CO Q-10 100 MG PO CAPS: 1.0000 | ORAL_CAPSULE | Freq: Every day | ORAL | Status: DC

## 2024-02-06 MED ORDER — POLYETHYLENE GLYCOL 3350 17 G PO PACK
17.0000 g | PACK | Freq: Every day | ORAL | Status: DC
  Administered 2024-02-07 – 2024-02-09 (×3): 17 g via ORAL
  Filled 2024-02-06 (×4): qty 1

## 2024-02-06 MED ORDER — SODIUM CHLORIDE 0.9 % IV SOLN
100.0000 mg | Freq: Two times a day (BID) | INTRAVENOUS | Status: DC
  Administered 2024-02-06 – 2024-02-08 (×4): 100 mg via INTRAVENOUS
  Filled 2024-02-06 (×6): qty 100

## 2024-02-06 MED ORDER — BUDESON-GLYCOPYRROL-FORMOTEROL 160-9-4.8 MCG/ACT IN AERO
2.0000 | INHALATION_SPRAY | Freq: Two times a day (BID) | RESPIRATORY_TRACT | Status: DC
  Administered 2024-02-06 – 2024-02-12 (×12): 2 via RESPIRATORY_TRACT
  Filled 2024-02-06 (×2): qty 5.9

## 2024-02-06 MED ORDER — ROSUVASTATIN CALCIUM 10 MG PO TABS
10.0000 mg | ORAL_TABLET | Freq: Every day | ORAL | Status: DC
  Administered 2024-02-06 – 2024-02-11 (×6): 10 mg via ORAL
  Filled 2024-02-06 (×6): qty 1

## 2024-02-06 MED ORDER — POLYETHYLENE GLYCOL 3350 17 GM/SCOOP PO POWD
17.0000 g | Freq: Every day | ORAL | Status: DC
  Filled 2024-02-06: qty 119

## 2024-02-06 MED ORDER — INSULIN ASPART 100 UNIT/ML IJ SOLN
0.0000 [IU] | Freq: Every day | INTRAMUSCULAR | Status: DC
  Administered 2024-02-06: 4 [IU] via SUBCUTANEOUS
  Filled 2024-02-06: qty 4
  Filled 2024-02-06: qty 5

## 2024-02-06 MED ORDER — GUAIFENESIN 100 MG/5ML PO LIQD
10.0000 mL | Freq: Four times a day (QID) | ORAL | Status: DC
  Administered 2024-02-06 – 2024-02-12 (×23): 10 mL via ORAL
  Filled 2024-02-06 (×23): qty 10

## 2024-02-06 MED ORDER — ASPIRIN 81 MG PO TBEC: 81.0000 mg | DELAYED_RELEASE_TABLET | Freq: Every day | ORAL | Status: DC

## 2024-02-06 MED ORDER — SENNOSIDES-DOCUSATE SODIUM 8.6-50 MG PO TABS: 1.0000 | ORAL_TABLET | Freq: Every evening | ORAL | Status: DC | PRN

## 2024-02-06 MED ORDER — DOCUSATE SODIUM 100 MG PO CAPS: 100.0000 mg | ORAL_CAPSULE | Freq: Two times a day (BID) | ORAL | Status: DC

## 2024-02-06 MED ORDER — INSULIN ASPART 100 UNIT/ML IJ SOLN
0.0000 [IU] | Freq: Three times a day (TID) | INTRAMUSCULAR | Status: DC
  Administered 2024-02-06 (×2): 5 [IU] via SUBCUTANEOUS
  Administered 2024-02-06 – 2024-02-08 (×3): 3 [IU] via SUBCUTANEOUS
  Administered 2024-02-08: 11:00:00 2 [IU] via SUBCUTANEOUS
  Administered 2024-02-08 – 2024-02-09 (×2): 3 [IU] via SUBCUTANEOUS
  Administered 2024-02-09: 17:00:00 5 [IU] via SUBCUTANEOUS
  Administered 2024-02-10 – 2024-02-11 (×3): 2 [IU] via SUBCUTANEOUS
  Administered 2024-02-12: 5 [IU] via SUBCUTANEOUS
  Filled 2024-02-06: qty 3
  Filled 2024-02-06: qty 2
  Filled 2024-02-06 (×3): qty 5
  Filled 2024-02-06: qty 2
  Filled 2024-02-06: qty 3
  Filled 2024-02-06: qty 5
  Filled 2024-02-06: qty 3
  Filled 2024-02-06: qty 2
  Filled 2024-02-06 (×2): qty 3

## 2024-02-06 MED ORDER — OYSTER SHELL CALCIUM/D3 500-5 MG-MCG PO TABS
1.0000 | ORAL_TABLET | Freq: Two times a day (BID) | ORAL | Status: DC
  Administered 2024-02-06 – 2024-02-12 (×13): 1 via ORAL
  Filled 2024-02-06 (×13): qty 1

## 2024-02-06 MED ORDER — DOXYCYCLINE HYCLATE 100 MG PO TABS: 100.0000 mg | ORAL_TABLET | Freq: Two times a day (BID) | ORAL | Status: DC

## 2024-02-06 MED ORDER — CLOPIDOGREL BISULFATE 75 MG PO TABS
75.0000 mg | ORAL_TABLET | Freq: Every day | ORAL | Status: DC
  Administered 2024-02-06 – 2024-02-12 (×7): 75 mg via ORAL
  Filled 2024-02-06 (×7): qty 1

## 2024-02-06 MED ORDER — FAMOTIDINE 20 MG PO TABS: 10.0000 mg | ORAL_TABLET | Freq: Every day | ORAL | Status: DC

## 2024-02-06 MED ORDER — FUROSEMIDE 10 MG/ML IJ SOLN
20.0000 mg | Freq: Every day | INTRAMUSCULAR | Status: DC
  Administered 2024-02-07: 20 mg via INTRAVENOUS
  Filled 2024-02-06: qty 2

## 2024-02-06 MED ORDER — FAMOTIDINE 40 MG/5ML PO SUSR
10.0000 mg | Freq: Every day | ORAL | Status: DC
  Administered 2024-02-06 – 2024-02-12 (×7): 10.4 mg via ORAL
  Filled 2024-02-06 (×7): qty 1.3

## 2024-02-06 MED ORDER — ASPIRIN 81 MG PO CHEW
81.0000 mg | CHEWABLE_TABLET | Freq: Every day | ORAL | Status: DC
  Administered 2024-02-06 – 2024-02-12 (×7): 81 mg via ORAL
  Filled 2024-02-06 (×7): qty 1

## 2024-02-06 NOTE — Progress Notes (Signed)
 [facility]  - [place] at [facility]   PATIENT NAME: [name]    MR#:  [id number]  DATE OF BIRTH:  1930-09-24  SUBJECTIVE:  daughter-in-law at bedside. 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 Came in with increasing shortness of breath. Had to be placed on BiPAP now off. Able to complete sentence however gets short winded. Received Lasix  earlier for elevated BNP    VITALS:  Blood pressure (!) 120/57, pulse 68, temperature 98 F (36.7 C), temperature source Oral, resp. rate (!) 23, height 5' 8 (1.727 m), weight 99.8 kg, SpO2 100%.  PHYSICAL EXAMINATION:   GENERAL:  89 y.o.-year-old patient with no acute distress. Morbidly obese LUNGS: decreased breath sounds bilaterally, no wheezing CARDIOVASCULAR: S1, S2 normal. No murmur   ABDOMEN: Soft, nontender, nondistended. Bowel sounds present.  EXTREMITIES: No  edema b/l.   Patient has BKA stump NEUROLOGIC: nonfocal  patient is alert and awake SKIN: per RN as below  LABORATORY PANEL:  CBC Recent Labs  Lab 02/06/24 0339  WBC 13.3*  HGB 9.5*  HCT 30.9*  PLT 126*    Chemistries  Recent Labs  Lab 02/05/24 1741 02/06/24 0339  NA 138 135  K 4.7 4.1  CL 101 102  CO2 27 21*  GLUCOSE 182* 302*  BUN 35* 36*  CREATININE 1.84* 1.77*  CALCIUM  8.2* 8.1*  AST 15  --   ALT 6  --   ALKPHOS 75  --   BILITOT 0.3  --      RADIOLOGY:  DG Chest Port 1 View if patient is in a treatment room. Result Date: 02/05/2024 EXAM: 1 VIEW(S) XRAY OF THE CHEST 02/05/2024 05:50:00 PM COMPARISON: 02/06/2023 CLINICAL HISTORY: Suspected Sepsis FINDINGS: LUNGS AND PLEURA: Low lung volumes with vascular crowding. Left retrocardiac opacity, likely atelectasis. Left pleural effusion. No pneumothorax. HEART AND MEDIASTINUM: Stable cardiomegaly. Aortic atherosclerotic calcification. Status post CABG. BONES AND SOFT TISSUES: Status post median sternotomy. IMPRESSION: 1. Left retrocardiac opacity, likely atelectasis, with small  left pleural effusion. 2. Stable cardiomegaly. Electronically signed by: Pinkie Pebbles MD 02/05/2024 06:43 PM EST RP Workstation: [id number]    Assessment and Plan [name]. is a 89 y.o. Caucasian male with medical history significant for anxiety, depression, coronary artery disease, type 2 diabetes mellitus, hypertension, dyslipidemia and PTSD, who presented to the ER with acute onset of worsening dyspnea since Tuesday with associated congested cough with inability to expectorate as well as wheezing, fever and chills.  He was diagnosed with pneumonia on Wednesday at his SNF and was given p.o. doxycycline    He was in significant respiratory distress upon arrival to ED that he had to be placed on BiPAP. Now on [place] oxygen   chest x-ray showed stable cardiomegaly and left retrocardiac opacity likely atelectatic with small left pleural effusion.    Acute hypoxic hypercarbic respiratory failure (HCC) - This is secondary to pneumonia with associated sepsis and congestive heart failure acute diastolic - patient is off BiPAP. Use as needed - Will continue antibiotic therapy with IV Rocephin  and doxycycline . - Mucolytic therapy be provided as well as duo nebs q.i.d. and q.4 hours p.r.n. -blood culture-- so far negative    Sepsis due to pneumonia [facility]) - This is manifested by leukocytosis, tachycardia and tachypnea. - Management as above. - Pro calcitonin 1.29  - incentive spirometer, flutter valve. Wean oxygen  as tolerated  Congestive heart failure acute diastolic history of coronary artery disease status post CABG in the past --echo in March 2025 showed  EF of 55 to 60%-- received one dose of IV Lasix  -- BNP 2300 -- will give Lasix  20 mg daily -- patient was seen by [facility] cardiology in the past. He is primary doctors RN at the [employer]  CKD stage IIIB -- baseline creatinine around 1.75   Dyslipidemia - Continue statin therapy.   Depression - continue Wellbutrin  XL   Essential  hypertension - continue antihypertensive therapy.   Type 2 diabetes mellitus with chronic kidney disease, with long-term current use of insulin  (HCC), peripheral neuropathy - The patient will be placed on supplement coverage with NovoLog . - Will continue basal coverage. - continue Januvia . - Patient takes Lyrica  for diabetic neuropathy.  Wound 02/06/24 1111 Pressure Injury Ischial tuberosity Right Stage 2 -  Partial thickness loss of dermis presenting as a shallow open injury with a red, pink wound bed without slough. (Active)     Wound 02/06/24 1111 Pressure Injury Ischial tuberosity Left;Posterior;Proximal Stage 3 -  Full thickness tissue loss. Subcutaneous fat may be visible but bone, tendon or muscle are NOT exposed. (Active)     Wound 02/06/24 1112 Pressure Injury Buttocks Right;Left Stage 3 -  Full thickness tissue loss. Subcutaneous fat may be visible but bone, tendon or muscle are NOT exposed. (Active)   Follow-up wound care consult      Procedures: Family communication : daughter-in-law at bedside Consults : CODE STATUS: DNR/DNI per out of hospital DNR form DVT Prophylaxis : Lovenox  Level of care: Telemetry Status is: Inpatient Remains inpatient appropriate because: sepsis, CHF, PNA     TOTAL TIME TAKING CARE OF THIS PATIENT: 40 minutes.  >50% time spent on counselling and coordination of care  Note: This dictation was prepared with Dragon dictation along with smaller phrase technology. Any transcriptional errors that result from this process are unintentional.  Leita Blanch M.D    [facility]   CC: Primary care physician; Center, [facility]

## 2024-02-06 NOTE — Plan of Care (Signed)

## 2024-02-06 NOTE — Assessment & Plan Note (Signed)
-   Will continue Wellbutrin XL.. 

## 2024-02-06 NOTE — Assessment & Plan Note (Signed)
-   This is manifested by leukocytosis, tachycardia and tachypnea. - Management as above. - We will continue cautious hydration with IV lactated ringer  given elevated proBNP. - The patient's oxygenation has been improving as well as his respiratory distress.

## 2024-02-06 NOTE — ED Notes (Signed)
 Patient was changed out of a soaked brief, cleaned up after a large, brown BM. Patient voided several times during the change and linens were changed twice. Patient has an external catheter in place hooked up to suction for I&Os and skin integrity. Patient has extensive decubiti stage II, excoriations on buttocks with deeper wounds in the folds of upper thigh, areas had bleeding that was controlled with pressure during cleaning. Barrier cream from the ED was applied on all areas. Patient became more dyspneic due to the prolonged time to clean him up twice. Condition improved with reposition on bed with HOB raised. Dr. Tobie aware. Sons are at bedside.

## 2024-02-06 NOTE — Assessment & Plan Note (Addendum)
-   This is associated with hypoxia and hypercarbia. - This is secondary to pneumonia with associated sepsis. - The patient will be admitted to a progressive unit bed. - Will continue the patient on BiPAP. - Will continue antibiotic therapy with IV Rocephin  and Zithromax . - Mucolytic therapy be provided as well as duo nebs q.i.d. and q.4 hours p.r.n. - We will follow blood cultures.

## 2024-02-06 NOTE — ED Notes (Signed)
Daughter in law at bedside

## 2024-02-06 NOTE — ED Notes (Signed)
 Pharmacy called for clarification on other medications remaining.

## 2024-02-06 NOTE — ED Notes (Signed)
 Insulin  will be given when lunch tray is available.

## 2024-02-06 NOTE — Assessment & Plan Note (Signed)
-   The patient will be placed on supplement coverage with NovoLog . - Will continue basal coverage. - Will continue Januvia . - Will continue Lyrica  for diabetic neuropathy.

## 2024-02-06 NOTE — Assessment & Plan Note (Signed)
 Continue statin therapy

## 2024-02-06 NOTE — ED Notes (Signed)
 Patient's medications inhaler, flonase  and butt cream are with the patient on admission. Patient's dentures are in a pink container with patient on admission.

## 2024-02-06 NOTE — ED Notes (Signed)
 Meds need to be crushed and mixed with apple sauce.

## 2024-02-06 NOTE — ED Notes (Signed)
 Due to patient's dysphagia, Dr. Tobie was contacted about changes needed to medications for administration.Pharmacist Raquel also consulted.

## 2024-02-06 NOTE — Assessment & Plan Note (Signed)
-   Will continue antihypertensive therapy.

## 2024-02-06 NOTE — Consult Note (Signed)
 WOC Nurse Consult Note:  patient is known to [facility] team from previous admissions with  pressure injuries to B ischium and buttocks in setting of moisture damage and chronic tissue damage  Reason for Consult: buttocks and ischial wounds  Wound type: 1.  Stage 3 Pressure Injury L posterior thigh/ischium 50% red 50% dark tissue  2. Stage 2 Pressure Injury R posterior thigh/ischium largely red moist (linear in nature, evidence of friction, shear and moisture)  3.  Stage 3 Pressure Injuries B buttocks R larger area than L; red moist tissue with some scattered brown; again evidence of friction and moisture  Pressure Injury POA: Yes Measurement: see nursing flowsheet  Wound bed: as above  Drainage (amount, consistency, odor) see nursing flowsheet  Periwound: patients buttocks/sacrum/ischium has evidence of chronic tissue damage; dusky widespread discoloration  Dressing procedure/placement/frequency: Cleanse B buttocks/posterior thigh/ischial wounds with Vashe, do not rinse. Apply Xeroform gauze (Lawson [id number]) to wound beds daily. Apply Gerhardt's Butt Cream to surrounding intact skin.  Apply silicone foam or ABD pads to area whichever is preferred.   Patient would benefit from a low air loss mattress for pressure redistribution and moisture management.   POC discussed with bedside nurse. WOC team will not follow. REconsult if further needs arise.   Thank you,    Powell Bar MSN, RN-BC, [employer]

## 2024-02-07 DIAGNOSIS — I5031 Acute diastolic (congestive) heart failure: Secondary | ICD-10-CM

## 2024-02-07 DIAGNOSIS — I25110 Atherosclerotic heart disease of native coronary artery with unstable angina pectoris: Secondary | ICD-10-CM

## 2024-02-07 DIAGNOSIS — N1832 Chronic kidney disease, stage 3b: Secondary | ICD-10-CM

## 2024-02-07 DIAGNOSIS — J9622 Acute and chronic respiratory failure with hypercapnia: Secondary | ICD-10-CM

## 2024-02-07 DIAGNOSIS — I259 Chronic ischemic heart disease, unspecified: Secondary | ICD-10-CM

## 2024-02-07 DIAGNOSIS — J9621 Acute and chronic respiratory failure with hypoxia: Secondary | ICD-10-CM

## 2024-02-07 DIAGNOSIS — Z794 Long term (current) use of insulin: Secondary | ICD-10-CM

## 2024-02-07 DIAGNOSIS — Z951 Presence of aortocoronary bypass graft: Secondary | ICD-10-CM

## 2024-02-07 DIAGNOSIS — E1122 Type 2 diabetes mellitus with diabetic chronic kidney disease: Secondary | ICD-10-CM

## 2024-02-07 LAB — BLOOD CULTURE ID PANEL (REFLEXED) - BCID2

## 2024-02-07 LAB — TROPONIN T, HIGH SENSITIVITY
Troponin T High Sensitivity: 194 ng/L (ref 0–19)
Troponin T High Sensitivity: 206 ng/L (ref 0–19)

## 2024-02-07 LAB — BASIC METABOLIC PANEL WITH GFR
Anion gap: 9 (ref 5–15)
BUN: 40 mg/dL — ABNORMAL HIGH (ref 8–23)
CO2: 27 mmol/L (ref 22–32)
Calcium: 8.6 mg/dL — ABNORMAL LOW (ref 8.9–10.3)
Chloride: 103 mmol/L (ref 98–111)
Creatinine, Ser: 1.84 mg/dL — ABNORMAL HIGH (ref 0.61–1.24)
GFR, Estimated: 34 mL/min — ABNORMAL LOW (ref >60)
Glucose, Bld: 136 mg/dL — ABNORMAL HIGH (ref 70–99)
Potassium: 3.3 mmol/L — ABNORMAL LOW (ref 3.5–5.1)
Sodium: 139 mmol/L (ref 135–145)

## 2024-02-07 LAB — GLUCOSE, CAPILLARY
Glucose-Capillary: 116 mg/dL — ABNORMAL HIGH (ref 70–99)
Glucose-Capillary: 177 mg/dL — ABNORMAL HIGH (ref 70–99)
Glucose-Capillary: 151 mg/dL — ABNORMAL HIGH (ref 70–99)
Glucose-Capillary: 159 mg/dL — ABNORMAL HIGH (ref 70–99)

## 2024-02-07 LAB — CBC
HCT: 31.3 % — ABNORMAL LOW (ref 39.0–52.0)
Hemoglobin: 10.1 g/dL — ABNORMAL LOW (ref 13.0–17.0)
MCH: 30.3 pg (ref 26.0–34.0)
MCHC: 32.3 g/dL (ref 30.0–36.0)
MCV: 94 fL (ref 80.0–100.0)
Platelets: 179 K/uL (ref 150–400)
RBC: 3.33 MIL/uL — ABNORMAL LOW (ref 4.22–5.81)
RDW: 14.2 % (ref 11.5–15.5)
WBC: 12 K/uL — ABNORMAL HIGH (ref 4.0–10.5)
nRBC: 0 % (ref 0.0–0.2)

## 2024-02-07 LAB — APTT: aPTT: 30 s (ref 24–36)

## 2024-02-07 LAB — PROTIME-INR
INR: 0.9 (ref 0.8–1.2)
Prothrombin Time: 12.8 s (ref 11.4–15.2)

## 2024-02-07 MED ORDER — POTASSIUM CHLORIDE 20 MEQ PO PACK
40.0000 meq | PACK | Freq: Once | ORAL | Status: AC
  Administered 2024-02-07: 40 meq via ORAL
  Filled 2024-02-07: qty 2

## 2024-02-07 MED ORDER — NITROGLYCERIN 0.4 MG SL SUBL
SUBLINGUAL_TABLET | SUBLINGUAL | Status: AC
  Administered 2024-02-07: 0.4 mg via SUBLINGUAL
  Filled 2024-02-07: qty 1

## 2024-02-07 MED ORDER — ASPIRIN 325 MG PO TBEC: 325.0000 mg | DELAYED_RELEASE_TABLET | Freq: Once | ORAL | Status: AC

## 2024-02-07 MED ORDER — MORPHINE SULFATE (PF) 2 MG/ML IV SOLN: 1.0000 mg | Freq: Once | INTRAVENOUS | Status: AC

## 2024-02-07 MED ORDER — NITROGLYCERIN 0.4 MG SL SUBL: 0.4000 mg | SUBLINGUAL_TABLET | SUBLINGUAL | Status: DC | PRN

## 2024-02-07 MED ORDER — INSULIN GLARGINE 100 UNIT/ML ~~LOC~~ SOLN
10.0000 [IU] | Freq: Every day | SUBCUTANEOUS | Status: DC
  Administered 2024-02-07 – 2024-02-11 (×4): 10 [IU] via SUBCUTANEOUS
  Filled 2024-02-07 (×6): qty 0.1

## 2024-02-07 MED ORDER — ASPIRIN 325 MG PO TBEC
DELAYED_RELEASE_TABLET | ORAL | Status: AC
  Administered 2024-02-07: 325 mg via ORAL
  Filled 2024-02-07: qty 1

## 2024-02-07 MED ORDER — FUROSEMIDE 10 MG/ML IJ SOLN
40.0000 mg | Freq: Once | INTRAMUSCULAR | Status: AC
  Administered 2024-02-07: 40 mg via INTRAVENOUS
  Filled 2024-02-07: qty 4

## 2024-02-07 MED ORDER — LINAGLIPTIN 5 MG PO TABS
5.0000 mg | ORAL_TABLET | Freq: Every day | ORAL | Status: DC
  Administered 2024-02-07 – 2024-02-12 (×6): 5 mg via ORAL
  Filled 2024-02-07 (×6): qty 1

## 2024-02-07 MED ORDER — HEPARIN (PORCINE) 25000 UT/250ML-% IV SOLN
1050.0000 [IU]/h | INTRAVENOUS | Status: DC
  Administered 2024-02-07: 1150 [IU]/h via INTRAVENOUS
  Filled 2024-02-07 (×2): qty 250

## 2024-02-07 MED ORDER — MORPHINE SULFATE (PF) 2 MG/ML IV SOLN
INTRAVENOUS | Status: AC
  Administered 2024-02-07: 1 mg via INTRAVENOUS
  Filled 2024-02-07: qty 1

## 2024-02-07 MED ORDER — IPRATROPIUM-ALBUTEROL 0.5-2.5 (3) MG/3ML IN SOLN
3.0000 mL | Freq: Two times a day (BID) | RESPIRATORY_TRACT | Status: DC
  Administered 2024-02-07 – 2024-02-10 (×6): 3 mL via RESPIRATORY_TRACT
  Filled 2024-02-07 (×6): qty 3

## 2024-02-07 NOTE — Progress Notes (Signed)
 Pt's 2nd troponin is 206. D/w dr Christopher--start heparin  gtt for NSTEMI and transfer to 2a Pt has anemia of Chronic dz. Hgb 9.0  Heparin  gtt to be managed by pharmacy.

## 2024-02-07 NOTE — Plan of Care (Signed)

## 2024-02-07 NOTE — Progress Notes (Signed)
° ° °  Brother in social worker at bedside.Nurse called to the room. Patient c/o chest pain. BP 154/79, 100% on RA. Patient states that feels as though an elephant is on his chest. RR activated. \

## 2024-02-07 NOTE — Progress Notes (Signed)
 Pt resting without c/o pain or discomfort, VS within normal limits, transported in bed with O2/heparin  gtt in place to 2A bed 234 without incident. Full nursing report called to receiving RN by April, RN. All belongings secured by family members at bedside. Care relinquished to 2A staff without change in condition or incident.

## 2024-02-07 NOTE — Plan of Care (Signed)
°  Problem: Clinical Measurements: Goal: Signs and symptoms of infection will decrease Outcome: Progressing   Problem: Fluid Volume: Goal: Ability to maintain a balanced intake and output will improve Outcome: Progressing   Problem: Nutritional: Goal: Maintenance of adequate nutrition will improve Outcome: Progressing   Problem: Tissue Perfusion: Goal: Adequacy of tissue perfusion will improve Outcome: Progressing

## 2024-02-07 NOTE — Progress Notes (Signed)
 PHARMACY - PHYSICIAN COMMUNICATION CRITICAL VALUE ALERT - BLOOD CULTURE IDENTIFICATION (BCID)  Results for orders placed or performed during the hospital encounter of 02/05/24  Resp panel by RT-PCR (RSV, Flu A&B, Covid) Anterior Nasal Swab     Status: None   Collection Time: 02/05/24  5:42 PM   Specimen: Anterior Nasal Swab  Result Value Ref Range Status   SARS Coronavirus 2 by RT PCR NEGATIVE NEGATIVE Final    Comment: (NOTE) SARS-CoV-2 target nucleic acids are NOT DETECTED.  The SARS-CoV-2 RNA is generally detectable in upper respiratory specimens during the acute phase of infection. The lowest concentration of SARS-CoV-2 viral copies this assay can detect is 138 copies/mL. A negative result does not preclude SARS-Cov-2 infection and should not be used as the sole basis for treatment or other patient management decisions. A negative result may occur with  improper specimen collection/handling, submission of specimen other than nasopharyngeal swab, presence of viral mutation(s) within the areas targeted by this assay, and inadequate number of viral copies(<138 copies/mL). A negative result must be combined with clinical observations, patient history, and epidemiological information. The expected result is Negative.  Fact Sheet for Patients:  bloggercourse.com  Fact Sheet for Healthcare Providers:  seriousbroker.it  This test is no t yet approved or cleared by the [place]  FDA and  has been authorized for detection and/or diagnosis of SARS-CoV-2 by FDA under an Emergency Use Authorization (EUA). This EUA will remain  in effect (meaning this test can be used) for the duration of the COVID-19 declaration under Section 564(b)(1) of the Act, 21 U.S.C.section 360bbb-3(b)(1), unless the authorization is terminated  or revoked sooner.       Influenza A by PCR NEGATIVE NEGATIVE Final   Influenza B by PCR NEGATIVE NEGATIVE Final     Comment: (NOTE) The Xpert Xpress SARS-CoV-2/FLU/RSV plus assay is intended as an aid in the diagnosis of influenza from Nasopharyngeal swab specimens and should not be used as a sole basis for treatment. Nasal washings and aspirates are unacceptable for Xpert Xpress SARS-CoV-2/FLU/RSV testing.  Fact Sheet for Patients: bloggercourse.com  Fact Sheet for Healthcare Providers: seriousbroker.it  This test is not yet approved or cleared by the [place]  FDA and has been authorized for detection and/or diagnosis of SARS-CoV-2 by FDA under an Emergency Use Authorization (EUA). This EUA will remain in effect (meaning this test can be used) for the duration of the COVID-19 declaration under Section 564(b)(1) of the Act, 21 U.S.C. section 360bbb-3(b)(1), unless the authorization is terminated or revoked.     Resp Syncytial Virus by PCR NEGATIVE NEGATIVE Final    Comment: (NOTE) Fact Sheet for Patients: bloggercourse.com  Fact Sheet for Healthcare Providers: seriousbroker.it  This test is not yet approved or cleared by the [place]  FDA and has been authorized for detection and/or diagnosis of SARS-CoV-2 by FDA under an Emergency Use Authorization (EUA). This EUA will remain in effect (meaning this test can be used) for the duration of the COVID-19 declaration under Section 564(b)(1) of the Act, 21 U.S.C. section 360bbb-3(b)(1), unless the authorization is terminated or revoked.  Performed at [facility], [address] [place], [place] [address]   Culture, blood (Routine x 2)     Status: None (Preliminary result)   Collection Time: 02/05/24  5:54 PM   Specimen: BLOOD  Result Value Ref Range Status   Specimen Description BLOOD BLOOD RIGHT HAND  Final   Special Requests   Final    BOTTLES DRAWN AEROBIC AND  ANAEROBIC Blood Culture adequate volume   Culture   Setup Time   Final    Organism ID to follow GRAM POSITIVE COCCI AEROBIC BOTTLE ONLY Performed at [facility], [address] [place], [place] [address]    Culture [facility] POSITIVE COCCI  Final   Report Status PENDING  Incomplete  Culture, blood (Routine x 2)     Status: None (Preliminary result)   Collection Time: 02/05/24  5:54 PM   Specimen: BLOOD  Result Value Ref Range Status   Specimen Description BLOOD BLOOD RIGHT ARM  Final   Special Requests   Final    BOTTLES DRAWN AEROBIC AND ANAEROBIC Blood Culture adequate volume   Culture   Final    NO GROWTH 2 DAYS Performed at [facility], [address], [place], [place] [address]    Report Status PENDING  Incomplete  MRSA Next Gen by PCR, Nasal     Status: Abnormal   Collection Time: 02/06/24  4:17 PM   Specimen: Nasal Mucosa; Nasal Swab  Result Value Ref Range Status   MRSA by PCR Next Gen DETECTED (A) NOT DETECTED Final    Comment: RESULT CALLED TO, READ BACK BY AND VERIFIED WITH: BYERS, JASON @ 02/06/2024 1924 AB (NOTE) The GeneXpert MRSA Assay (FDA approved for NASAL specimens only), is one component of a comprehensive MRSA colonization surveillance program. It is not intended to diagnose MRSA infection nor to guide or monitor treatment for MRSA infections. Test performance is not FDA approved in patients less than 24 years old. Performed at [facility], [address] [place], [place] [address]     BCID Results: 1 (aerobic) of 4 bottles w/ Staph species, no resistance.  Pt currently on Ceftriaxone  and Doxycycline .  Name of provider contacted: DOROTHA Peaches, MD   Changes to prescribed antibiotics required: No changes at this time pending additional review / lab culture results.  Rankin CANDIE Dills, PharmD, [facility] 02/07/2024 6:32 AM

## 2024-02-07 NOTE — Consult Note (Signed)
 Pharmacy Consult Note - Anticoagulation  Pharmacy Consult for heparin  infusion Indication: chest pain/ACS Allergies[1]  PATIENT MEASUREMENTS: Height: 5' 8 (172.7 cm) Weight: 99.8 kg (220 lb) IBW/kg (Calculated) : 68.4 HEPARIN  DW (KG): 89.8  VITAL SIGNS: Temp: 97.7 F (36.5 C) (12/14 1702) BP: 154/70 (12/14 1702) Pulse Rate: 64 (12/14 1702)  Recent Labs    02/06/24 0339 02/07/24 1017  HGB 9.5*  --   HCT 30.9*  --   PLT 126*  --   LABPROT 14.0  --   INR 1.0  --   CREATININE 1.77* 1.84*    Estimated Creatinine Clearance: 30 mL/min (A) (by C-G formula based on SCr of 1.84 mg/dL (H)).  PAST MEDICAL HISTORY: Past Medical History:  Diagnosis Date   Agent orange exposure 1966 or 1971   Anemia    Anxiety    Arthritis    hands and back (01/20/2013)   CAD (coronary artery disease)    native vessel   Carotid artery disease    nonobstructive   Cataract    Cecal diverticulitis 2008   drained   Cellulitis, gluteal    bilateral for the past 6 months/notes 01/20/2013   Chronic lower back pain    Chronic renal insufficiency    Depression    Diabetes mellitus type II    Exertional shortness of breath    HTN (hypertension)    Hyperlipidemia    Hypokalemia    Malnutrition    protein-calorie   Myocardial infarction (HCC)    silent; before OHS (01/20/2013)   PTSD (post-traumatic stress disorder)    Spina bifida (HCC)    Urinary incontinence     Medications:  Medications Prior to Admission  Medication Sig Dispense Refill Last Dose/Taking   acetaminophen  (TYLENOL ) 325 MG tablet Take 650 mg by mouth every 6 (six) hours as needed.   Taking As Needed   Ashwagandha 300 MG TABS Take 1 capsule by mouth at bedtime. Take 460 mg by mouth at bedtime.   02/05/2024   aspirin  81 MG tablet Take 81 mg by mouth daily.   02/05/2024   buPROPion  (WELLBUTRIN  XL) 150 MG 24 hr tablet Take 150 mg by mouth daily.   02/05/2024   calcium -vitamin D  (OSCAL WITH D) 500-5 MG-MCG tablet Take 1  tablet by mouth 2 (two) times daily. 180 tablet 1 02/05/2024   carboxymethylcellulose (REFRESH PLUS) 0.5 % SOLN Place 1 drop into both eyes QID.   02/05/2024   clopidogrel  (PLAVIX ) 75 MG tablet Take 1 tablet (75 mg total) by mouth daily. 30 tablet 4 02/05/2024   Coenzyme Q10 (CO Q-10) 100 MG CAPS Take 1 capsule by mouth daily.   02/05/2024   diclofenac  Sodium (VOLTAREN ) 1 % GEL Apply topically. APPLY 2 GRAMS TOPICALLY TO EACH SHOULDER EVERY 6 HOURS AS NEEDED   Taking   docusate sodium  (COLACE) 100 MG capsule Take 100 mg by mouth 2 (two) times daily.   02/05/2024   doxycycline  (VIBRAMYCIN ) 100 MG capsule Take 100 mg by mouth 2 (two) times daily.   02/05/2024 Morning   famotidine  (PEPCID ) 10 MG tablet Take 10 mg by mouth daily.   02/05/2024   faricimab-svoa (VABYSMO) 6 MG/0.05ML SOSY intravitreal injection 6 mg by Intravitreal route 3 (three) times daily. Administered by the [facility] 3 times a day on Friday every 5 weeks.   Taking   ferrous sulfate 300 (60 Fe) MG/5ML syrup Take 300 mg by mouth in the morning and at bedtime.   02/05/2024 Morning   fluticasone  (  FLONASE ) 50 MCG/ACT nasal spray Place 2 sprays into both nostrils daily.   02/05/2024   Fluticasone -Umeclidin-Vilant (TRELEGY ELLIPTA ) 100-62.5-25 MCG/ACT AEPB Inhale 1 puff into the lungs daily. 60 each 11 02/05/2024   GENERLAC  10 GM/15ML SOLN Take 30 mLs by mouth every Monday, Wednesday, and Friday.   02/05/2024   guaiFENesin  (MUCINEX ) 600 MG 12 hr tablet Take 600 mg by mouth every 12 (twelve) hours as needed.   02/05/2024   Insulin  Glargine (BASAGLAR  KWIKPEN) 100 UNIT/ML Inject 10 Units into the skin at bedtime.   02/04/2024   insulin  lispro (HUMALOG) 100 UNIT/ML KwikPen Inject subcutaneously as directed per sliding scale.   02/02/2024   ipratropium-albuterol  (DUONEB) 0.5-2.5 (3) MG/3ML SOLN 1 NEBULIZER SOLUTION ( ) BY ORAL INHALATION EVERY 4 TO 6 HOURS AS NEEDED   Taking   JANUVIA  25 MG tablet Take 25 mg by mouth daily.   02/05/2024    lidocaine  (LINDAMANTLE) 3 % CREA cream Apply 1 Application topically as needed. Apply to left shoulder every 6 hours as needed along with diclofenac  gel.   Taking As Needed   lidocaine  4 % Place 1 patch onto the skin daily. Apply topically to left chest. Remove at bedtime.   02/05/2024   metoprolol  tartrate (LOPRESSOR ) 25 MG tablet Take 0.5 tablets (12.5 mg total) by mouth 2 (two) times daily. (Patient taking differently: Take 12.5 mg by mouth daily.) 90 tablet 3 02/05/2024 Morning   polyethylene glycol powder (GLYCOLAX /MIRALAX ) 17 GM/SCOOP powder Take 17 g by mouth daily. 255 g 0 02/05/2024   pregabalin  (LYRICA ) 50 MG capsule Take 50 mg by mouth 2 (two) times daily.   02/05/2024 Morning   rosuvastatin  (CRESTOR ) 10 MG tablet Take 10 mg by mouth at bedtime.   02/04/2024   senna (SENOKOT) 8.6 MG tablet Take 2 tablets by mouth 2 (two) times daily.   Past Month   Sodium Phosphates (FLEET SALINE ENEMA) 7-19 GM/197ML ENEM Place rectally once.   Past Week   albuterol  (VENTOLIN  HFA) 108 (90 Base) MCG/ACT inhaler Inhale 2 puffs into the lungs every 6 (six) hours as needed for wheezing or shortness of breath. 18 g 6    ARTIFICIAL TEARS 0.2-0.2-1 % SOLN Apply to eye 3 (three) times daily. Instill 2 drops into each eye 3 times daily. (Patient not taking: Reported on 02/05/2024)   Not Taking   lactulose  (CHRONULAC ) 10 GM/15ML solution Take 20 g by mouth 2 (two) times daily.      Multiple Vitamins-Minerals (PRESERVISION AREDS 2 PO) Take 1 capsule by mouth 2 (two) times daily. (Patient not taking: Reported on 02/05/2024)   Not Taking   nystatin  (MYCOSTATIN /NYSTOP ) powder Apply topically 2 (two) times daily. Apply to groin and perineum twice a day (Patient taking differently: Apply topically 3 (three) times daily. Apply to groin and perineum every shift) 15 g 0    pregabalin  (LYRICA ) 25 MG capsule Take 25 mg by mouth daily as needed. (Patient not taking: Reported on 02/05/2024)   Not Taking   senna-docusate  (SENOKOT-S) 8.6-50 MG tablet Take 1 tablet by mouth at bedtime as needed for mild constipation. 30 tablet 1    simethicone  (GAS-X) 80 MG chewable tablet Chew 1 tablet (80 mg total) by mouth 4 (four) times daily as needed for flatulence. 60 tablet 0    Scheduled:   aspirin   81 mg Oral Daily   Basaglar  KwikPen  10 Units Subcutaneous QHS   budesonide -glycopyrrolate -formoterol   2 puff Inhalation BID   buPROPion   75 mg Oral BID  calcium -vitamin D   1 tablet Oral BID   clopidogrel   75 mg Oral Daily   docusate  100 mg Oral BID   famotidine   10.4 mg Oral Daily   fluticasone   2 spray Each Nare Daily   Gerhardt's butt cream   Topical BID   guaiFENesin   10 mL Oral QID   insulin  aspart  0-15 Units Subcutaneous TID WC   insulin  aspart  0-5 Units Subcutaneous QHS   ipratropium-albuterol   3 mL Nebulization BID   [START ON 02/08/2024] lactulose   20 g Oral Q M,W,F   linagliptin   5 mg Oral Daily   metoprolol  tartrate  12.5 mg Oral Daily   polyethylene glycol  17 g Oral Daily   rosuvastatin   10 mg Oral QHS   Infusions:   cefTRIAXone  (ROCEPHIN )  IV 2 g (02/07/24 0518)   doxycycline  (VIBRAMYCIN ) IV 100 mg (02/07/24 1334)   heparin      PRN: acetaminophen  **OR** acetaminophen , chlorpheniramine-HYDROcodone , diclofenac  Sodium, nitroGLYCERIN , ondansetron  **OR** ondansetron  (ZOFRAN ) IV, senna-docusate, traZODone   ASSESSMENT: 89 y.o. male with PMH CAD,HF, s/p CABG in 1989 is presenting with rapid response SOB, elevated troponin, and possible MI with cardiology consult. Patient is not on chronic anticoagulation per chart review. Was on LMWH on admission. Last dose on 12/13 at 2255. Pharmacy has been consulted to initiate and manage heparin  intravenous infusion.   Goal(s) of therapy: Heparin  level 0.3 - 0.7 units/mL aPTT 66 - 102 seconds Monitor platelets by anticoagulation protocol: Yes   Baseline anticoagulation labs: Recent Labs    02/05/24 1741 02/06/24 0339  INR 1.0 1.0  HGB 10.5* 9.5*  PLT  136* 126*      PLAN:  Per MD Tobie will omit bolus and start heparin  infusion at 1,150 units/hour.  Check heparin  level in 8 hours, then daily once at least two levels are consecutively therapeutic.  Monitor CBC daily while on heparin  infusion.   Annabella LOISE Banks, PharmD Clinical Pharmacist 02/07/2024 5:36 PM     [1]  Allergies Allergen Reactions   Sulfonamide Derivatives     REACTION: pruitis patient cant remember its been so long     Aspirin  Rash    In high doses   Nitrofurantoin Rash   Penicillins Rash    Did it involve swelling of the face/tongue/throat, SOB, or low BP? No Did it involve sudden or severe rash/hives, skin peeling, or any reaction on the inside of your mouth or nose? No Did you need to seek medical attention at a hospital or doctor's office? No When did it last happen?      Teenager If all above answers are NO, may proceed with cephalosporin use.   Sulfa  Antibiotics Rash

## 2024-02-07 NOTE — Significant Event (Addendum)
 Rapid Response Event Note   Reason for Call : Called RRT for CP   Initial Focused Assessment: sitting up in bed, VSS, alert and answering questions.      Interventions: Dr Patel/ Paraschos to bedside to evaluate pt, stat EKG and labs. 02, morphine , asa ordered.   Plan of Care: Dr End on for STEMI, pt doesn't meet criteriawill notify rounding team.    Event Summary: as above  MD Notified: Patel/Paraschos/End Call Upfz:[id number] Arrival Upfz:[id number] End Time:1015  Welton Bord A, RN

## 2024-02-07 NOTE — Consult Note (Signed)
 Cardiology Consultation:   Patient ID: [name].; [id number]; 04-11-1930   Admit date: 02/05/2024 Date of Consult: 02/07/2024  Primary Care Provider: [facility] Primary Cardiologist: Perla Primary Electrophysiologist:  None   Patient Profile:   [name]. is a 89 y.o. male with a hx of agent orange exposure, CAD status post remote CABG in 1998, HFpEF, chronic hypoxic respiratory failure on supplemental oxygen  via nasal cannula at 2 L, COPD, PAD status post AKA, DM2, CKD stage III, HTN, HLD, PTSD, carotid artery disease, and normocytic anemia who is being seen today for the evaluation of chest pain at the request of Dr. Tobie.  History of Present Illness:   [name] underwent remote CABG.  Nuclear stress test in 2007 showed no evidence of ischemia with preserved LV systolic function.  Echo in 2015 showed low normal LV systolic function with an EF of 50 to 55%, grade 1 diastolic dysfunction, and mildly reduced RV systolic function.  He was last seen in the outpatient setting virtually in 05/2018 with last in-person visit in 2019.  He has been lost to outpatient follow-up since.  He was admitted in 04/2023 with proctitis complicated by perineal fistula and possible cystitis.  Cardiology was consulted for elevated troponin peaking at 494 and subsequently downtrending.  Echo at that time showed an EF of 55 to 60%, no regional wall motion abnormalities, mild LVH, normal RV systolic function and ventricular cavity size, mild mitral regurgitation, mildly calcified aortic valve with aortic valve sclerosis without evidence of insufficiency or stenosis, and a normal CVP.  He has not followed up with cardiology in the outpatient setting.  He was admitted to [facility] on 02/05/2024 with acute hypoxic and hypercarbic respiratory failure secondary to pneumonia and HFpEF.  He initially required BiPAP which has been weaned off, currently on supplemental oxygen  via nasal cannula at  2 L.  proBNP 2383.  Lactic acid 1.0.  1 blood culture growing Staphylococcus species.  During the admission, he has been treated with IV antibiotics and doxycycline  along with mucolytic therapy.  On the morning of 02/07/2024, he developed severe chest pain.  EKG was reviewed by interventional cardiology and did not meet STEMI criteria.  He was treated with oxygen , aspirin , morphine , and SL NTG with symptomatic improvement.  In total, chest pain lasted for 30 to 40 minutes.  High-sensitivity troponin pending.  Has remained hemodynamically stable.  He is uncertain if chest discomfort felt similar to prior angina.  Currently, without chest pain.  Feels like dyspnea is unchanged from presentation.  Continues to note left lower extremity swelling.  At baseline, sleeps elevated which is unchanged.    Past Medical History:  Diagnosis Date   Agent orange exposure 1966 or 1971   Anemia    Anxiety    Arthritis    hands and back (01/20/2013)   CAD (coronary artery disease)    native vessel   Carotid artery disease    nonobstructive   Cataract    Cecal diverticulitis 2008   drained   Cellulitis, gluteal    bilateral for the past 6 months/notes 01/20/2013   Chronic lower back pain    Chronic renal insufficiency    Depression    Diabetes mellitus type II    Exertional shortness of breath    HTN (hypertension)    Hyperlipidemia    Hypokalemia    Malnutrition    protein-calorie   Myocardial infarction (HCC)    silent; before OHS (01/20/2013)  PTSD (post-traumatic stress disorder)    Spina bifida [facility])    Urinary incontinence     Past Surgical History:  Procedure Laterality Date   APPLICATION OF WOUND VAC Right 04/08/2019   Procedure: APPLICATION OF WOUND VAC;  Surgeon: Janit Thresa HERO, DPM;  Location: [facility];  Service: Podiatry;  Laterality: Right;   BONE BIOPSY Right 04/08/2019   Procedure: BONE BIOPSY;  Surgeon: Janit Thresa HERO, DPM;  Location: [facility];  Service: Podiatry;  Laterality: Right;   CARDIAC CATHETERIZATION  1998   couple before my OHS (01/20/2013)   CARPAL TUNNEL RELEASE Right 1980's   CATARACT EXTRACTION W/ INTRAOCULAR LENS  IMPLANT, BILATERAL Bilateral ~ 2010   CORONARY ARTERY BYPASS GRAFT  1998   CABG X4 (01/20/2013)   CYSTECTOMY  2000's   cytal cyst on my intestines; probed then drained it; hospitalized for 13 days; NPO (01/20/2013)   EYE SURGERY     HEMORRHOID BANDING  ~ 10/2012   IR FLUORO GUIDE CV LINE RIGHT  04/01/2019   IR US  GUIDE VASC ACCESS RIGHT  04/01/2019   KNEE SURGERY Left 1964   exploratory; sewed it up w/wire (01/20/2013)   LOWER EXTREMITY ANGIOGRAPHY Right 03/22/2019   Procedure: LOWER EXTREMITY ANGIOGRAPHY;  Surgeon: Jama Cordella MATSU, MD;  Location: [facility];  Service: Cardiovascular;  Laterality: Right;   LOWER EXTREMITY ANGIOGRAPHY Left 03/29/2019   Procedure: LOWER EXTREMITY ANGIOGRAPHY;  Surgeon: Jama Cordella MATSU, MD;  Location: [facility] CV [facility];  Service: Cardiovascular;  Laterality: Left;   PROSTATE SURGERY     SPINE SURGERY  11/24/1930   Spina bifida surgery   STRABISMUS SURGERY Bilateral 03/29/2015   Procedure: REPAIR STRABISMUS BILATERAL;  Surgeon: Glendale Blanch, MD;  Location: [facility];  Service: Ophthalmology;  Laterality: Bilateral;   WOUND DEBRIDEMENT Right 04/08/2019   Procedure: DEBRIDEMENT WOUND;  Surgeon: Janit Thresa HERO, DPM;  Location: [facility];  Service: Podiatry;  Laterality: Right;     Home Meds: Prior to Admission medications  Medication Sig Start Date End Date Taking? Authorizing Provider  acetaminophen  (TYLENOL ) 325 MG tablet Take 650 mg by mouth every 6 (six) hours as needed. 01/21/22  Yes [provider]  Ashwagandha 300 MG TABS Take 1 capsule by mouth at bedtime. Take 460 mg by mouth at bedtime. 04/29/22  Yes [provider]  aspirin  81 MG tablet Take 81 mg by mouth daily.   Yes [provider]  buPROPion  (WELLBUTRIN  XL) 150 MG 24 hr tablet Take 150 mg by mouth daily.   Yes [provider]  calcium -vitamin D  (OSCAL WITH D) 500-5 MG-MCG tablet Take 1 tablet by mouth 2 (two) times daily. 05/15/23  Yes Darci Pore, MD  carboxymethylcellulose (REFRESH PLUS) 0.5 % SOLN Place 1 drop into both eyes QID.   Yes [provider]  clopidogrel  (PLAVIX ) 75 MG tablet Take 1 tablet (75 mg total) by mouth daily. 03/23/19  Yes Schnier, Cordella MATSU, MD  Coenzyme Q10 (CO Q-10) 100 MG CAPS Take 1 capsule by mouth daily. 01/29/22  Yes [provider]  diclofenac  Sodium (VOLTAREN ) 1 % GEL Apply topically. APPLY 2 GRAMS TOPICALLY TO EACH SHOULDER EVERY 6 HOURS AS NEEDED 08/08/22  Yes [provider]  docusate sodium  (COLACE) 100 MG capsule Take 100 mg by mouth 2 (two) times daily.   Yes [provider]  doxycycline  (VIBRAMYCIN ) 100 MG capsule Take 100 mg by mouth 2 (two) times daily.  Yes [provider]  famotidine  (PEPCID ) 10 MG tablet Take 10 mg by mouth daily.   Yes [provider]  faricimab-svoa (VABYSMO) 6 MG/0.05ML SOSY intravitreal injection 6 mg by Intravitreal route 3 (three) times daily. Administered by the [facility] 3 times a day on Friday every 5 weeks.   Yes [provider]  ferrous sulfate 300 (60 Fe) MG/5ML syrup Take 300 mg by mouth in the morning and at bedtime. 06/23/23  Yes [provider]  fluticasone  (FLONASE ) 50 MCG/ACT nasal spray Place 2 sprays into both nostrils daily. 02/16/23  Yes [provider]  Fluticasone -Umeclidin-Vilant (TRELEGY ELLIPTA ) 100-62.5-25 MCG/ACT AEPB Inhale 1 puff into the lungs daily. 10/01/21  Yes Tamea Dedra CROME, MD  GENERLAC  10 GM/15ML SOLN Take 30 mLs by mouth every Monday, Wednesday, and Friday. 01/10/20  Yes [provider]  guaiFENesin  (MUCINEX ) 600 MG 12 hr tablet Take 600 mg by mouth every 12 (twelve) hours as needed. 12/19/22  Yes [provider]  Insulin  Glargine (BASAGLAR  KWIKPEN) 100 UNIT/ML Inject 10 Units into the skin at bedtime.   Yes [provider]  insulin  lispro (HUMALOG) 100 UNIT/ML KwikPen Inject subcutaneously as directed per sliding scale. 01/31/23  Yes [provider]  ipratropium-albuterol  (DUONEB) 0.5-2.5 (3) MG/3ML SOLN 1 NEBULIZER SOLUTION ( ) BY ORAL INHALATION EVERY 4 TO 6 HOURS AS NEEDED 12/13/21  Yes [provider]  JANUVIA  25 MG tablet Take 25 mg by mouth daily. 08/13/22  Yes [provider]  lidocaine  (LINDAMANTLE) 3 % CREA cream Apply 1 Application topically as needed. Apply to left shoulder every 6 hours as needed along with diclofenac  gel. 05/20/22  Yes [provider]  lidocaine  4 % Place 1 patch onto the skin daily. Apply topically to left chest. Remove at bedtime.   Yes [provider]  metoprolol  tartrate (LOPRESSOR ) 25 MG tablet Take 0.5 tablets (12.5 mg total) by mouth 2 (two) times daily. Patient taking differently: Take 12.5 mg by mouth daily. 12/15/18  Yes Dettinger, Fonda LABOR, MD  polyethylene glycol powder (GLYCOLAX /MIRALAX ) 17 GM/SCOOP powder Take 17 g by mouth daily. 07/26/21  Yes Dorothyann Drivers, MD  pregabalin  (LYRICA ) 50 MG capsule Take 50 mg by mouth 2 (two) times daily.   Yes [provider]  rosuvastatin  (CRESTOR ) 10 MG tablet Take 10 mg by mouth at bedtime. 08/13/22  Yes [provider]  senna (SENOKOT) 8.6 MG tablet Take 2 tablets by mouth 2 (two) times daily.   Yes [provider]  Sodium Phosphates (FLEET SALINE ENEMA) 7-19 GM/197ML ENEM Place rectally once.   Yes [provider]  albuterol  (VENTOLIN  HFA) 108 (90 Base) MCG/ACT inhaler Inhale 2 puffs into the lungs every 6 (six) hours as needed for wheezing or shortness of breath. 10/01/21   Tamea Dedra CROME, MD  ARTIFICIAL TEARS 0.2-0.2-1 % SOLN Apply to eye 3 (three) times daily. Instill 2 drops into each eye 3 times daily. Patient not taking:  Reported on 02/05/2024 02/20/20   [provider]  lactulose  (CHRONULAC ) 10 GM/15ML solution Take 20 g by mouth 2 (two) times daily. 12/12/22   [provider]  Multiple Vitamins-Minerals (PRESERVISION AREDS 2 PO) Take 1 capsule by mouth 2 (two) times daily. Patient not taking: Reported on 02/05/2024    [provider]  nystatin  (MYCOSTATIN /NYSTOP ) powder Apply topically 2 (two) times daily. Apply to groin and perineum twice a day Patient taking differently: Apply topically 3 (three) times daily. Apply to groin and perineum every shift  03/06/20   Jens Durand, MD  pregabalin  (LYRICA ) 25 MG capsule Take 25 mg by mouth daily as needed. Patient not taking: Reported on 02/05/2024    [provider]  senna-docusate (SENOKOT-S) 8.6-50 MG tablet Take 1 tablet by mouth at bedtime as needed for mild constipation. 02/19/22   Briana Joya BIRCH, MD  simethicone  (GAS-X) 80 MG chewable tablet Chew 1 tablet (80 mg total) by mouth 4 (four) times daily as needed for flatulence. 04/28/23 04/27/24  Viviann Pastor, MD    Inpatient Medications: Scheduled Meds:  aspirin   81 mg Oral Daily   budesonide -glycopyrrolate -formoterol   2 puff Inhalation BID   buPROPion   75 mg Oral BID   calcium -vitamin D   1 tablet Oral BID   clopidogrel   75 mg Oral Daily   docusate  100 mg Oral BID   enoxaparin  (LOVENOX ) injection  30 mg Subcutaneous QHS   famotidine   10.4 mg Oral Daily   fluticasone   2 spray Each Nare Daily   furosemide   20 mg Intravenous Daily   Gerhardt's butt cream   Topical BID   guaiFENesin   10 mL Oral QID   insulin  aspart  0-15 Units Subcutaneous TID WC   insulin  aspart  0-5 Units Subcutaneous QHS   ipratropium-albuterol   3 mL Nebulization BID   [START ON 02/08/2024] lactulose   20 g Oral Q M,W,F   metoprolol  tartrate  12.5 mg Oral Daily   polyethylene glycol  17 g Oral Daily   rosuvastatin   10 mg Oral QHS   Continuous Infusions:  cefTRIAXone  (ROCEPHIN )  IV 2 g (02/07/24  0518)   doxycycline  (VIBRAMYCIN ) IV 100 mg (02/07/24 0150)   PRN Meds: acetaminophen  **OR** acetaminophen , chlorpheniramine-HYDROcodone , diclofenac  Sodium, nitroGLYCERIN , ondansetron  **OR** ondansetron  (ZOFRAN ) IV, senna-docusate, traZODone   Allergies:  Allergies[1]  Social History:   Social History   Socioeconomic History   Marital status: Widowed    Spouse name: Kendell   Number of children: 4   Years of education: Not on file   Highest education level: Not on file  Occupational History   Occupation: Retired    Comment: [employer]   Tobacco Use   Smoking status: Former    Current packs/day: 0.00    Average packs/day: 2.0 packs/day for 31.7 years (63.5 ttl pk-yrs)    Types: Cigarettes    Start date: 11/20/1948    Quit date: 08/19/1980    Years since quitting: 43.4   Smokeless tobacco: Former    Types: Chew   Tobacco comments:    01/20/2013 quit chewing 20 yr ago  Vaping Use   Vaping status: Never Used  Substance and Sexual Activity   Alcohol  use: No    Comment: 01/20/2013 quit drinking 08/19/1980   Drug use: No   Sexual activity: Not Currently  Other Topics Concern   Not on file  Social History Narrative   Not on file   Social Drivers of Health   Tobacco Use: Medium Risk (02/06/2024)   Patient History    Smoking Tobacco Use: Former    Smokeless Tobacco Use: Former    Passive Exposure: Not on Actuary Strain: Not on file  Food Insecurity: Unknown (02/06/2024)   Epic    Worried About Programme Researcher, Broadcasting/film/video in the Last Year: Patient declined    Barista in the Last Year: Not on file  Transportation Needs: Patient Declined (02/06/2024)   Epic    Lack of Transportation (Medical): Patient declined    Lack of Transportation (Non-Medical): Patient declined  Physical Activity: Not on file  Stress: Not on file  Social Connections: Socially Isolated (02/06/2024)   Social Connection and Isolation Panel    Frequency of Communication with Friends and  Family: Once a week    Frequency of Social Gatherings with Friends and Family: Once a week    Attends Religious Services: 1 to 4 times per year    Active Member of [employer] or Organizations: No    Attends Banker Meetings: Never    Marital Status: Widowed  Intimate Partner Violence: Patient Declined (02/06/2024)   Epic    Fear of Current or Ex-Partner: Patient declined    Emotionally Abused: Patient declined    Physically Abused: Patient declined    Sexually Abused: Patient declined  Depression (PHQ2-9): Not on file  Alcohol  Screen: Not on file  Housing: Patient Declined (02/06/2024)   Epic    Unable to Pay for Housing in the Last Year: Patient declined    Number of Times Moved in the Last Year: Not on file    Homeless in the Last Year: Patient declined  Utilities: Patient Declined (02/06/2024)   Epic    Threatened with loss of utilities: Patient declined  Health Literacy: Not on file     Family History:   Family History  Problem Relation Age of Onset   Heart disease Mother    Heart attack Mother    Heart disease Father    Heart attack Father    Cancer Sister        breast   Cancer Sister        lung and ovarian    ROS:  Review of Systems  Constitutional:  Positive for malaise/fatigue. Negative for chills, diaphoresis, fever and weight loss.  HENT:  Negative for congestion.   Eyes:  Negative for discharge and redness.  Respiratory:  Positive for cough, shortness of breath and wheezing. Negative for sputum production.   Cardiovascular:  Positive for chest pain, orthopnea and leg swelling. Negative for palpitations, claudication and PND.  Gastrointestinal:  Negative for abdominal pain, blood in stool, heartburn, melena, nausea and vomiting.  Musculoskeletal:  Negative for falls and myalgias.  Skin:  Negative for rash.  Neurological:  Positive for weakness. Negative for dizziness, tingling, tremors, sensory change, speech change, focal weakness and loss of  consciousness.  Endo/Heme/Allergies:  Does not bruise/bleed easily.  Psychiatric/Behavioral:  Negative for substance abuse. The patient is not nervous/anxious.   All other systems reviewed and are negative.     Physical Exam/Data:   Vitals:   02/07/24 0400 02/07/24 0750 02/07/24 0824 02/07/24 0946  BP: (!) 130/55  (!) 137/46 (!) 159/74  Pulse: 75  72 82  Resp: 18  17 18   Temp: 97.8 F (36.6 C)  97.7 F (36.5 C) 98.1 F (36.7 C)  TempSrc:      SpO2: 100% 100% 94% 98%  Weight:      Height:        Intake/Output Summary (Last 24 hours) at 02/07/2024 1029 Last data filed at 02/07/2024 0556 Gross per 24 hour  Intake 446.24 ml  Output 470 ml  Net -23.76 ml   Filed Weights   02/05/24 2039  Weight: 99.8 kg   Body mass index is 33.45 kg/m.   Physical Exam: General: Well developed, well nourished, in no acute distress. Head: Normocephalic, atraumatic, sclera non-icteric, no xanthomas, nares without discharge.  Neck: Negative for carotid bruits. JVD difficult to assess secondary to her body habitus. Lungs: Diminished breath sounds  bilaterally with diffuse expiratory wheezing.  Breathing is unlabored on supplemental oxygen  via nasal cannula at 2 L. Heart: RRR with S1 S2. No murmurs, rubs, or gallops appreciated. Abdomen: Soft, non-tender, non-distended with normoactive bowel sounds. No hepatomegaly. No rebound/guarding. No obvious abdominal masses. Msk:  Strength and tone appear normal for age. Extremities: Status post right AKA.  Mild left lower extremity pitting edema. Neuro: Alert and oriented X 3. No facial asymmetry. No focal deficit. Moves all extremities spontaneously. Psych:  Responds to questions appropriately with a normal affect.   EKG:  The EKG was personally reviewed and demonstrates: Ectopic atrial tachycardia versus MAT, 111 bpm.  12/14 - NSR, 91 bpm, nonspecific ST elevation in lead III with nonspecific ST-T changes, not meeting STEMI criteria Telemetry:   Telemetry was personally reviewed and demonstrates: SR with frequent PACs (P waves are noted in lead II on telemetry)  Weights: Filed Weights   02/05/24 2039  Weight: 99.8 kg    Relevant CV Studies:  2D echo 05/14/2023: 1. Left ventricular ejection fraction, by estimation, is 55 to 60%. The  left ventricle has normal function. The left ventricle has no regional  wall motion abnormalities. There is mild left ventricular hypertrophy.  Left ventricular diastolic parameters  are indeterminate.   2. Right ventricular systolic function is normal. The right ventricular  size is normal.   3. The mitral valve is normal in structure. Mild mitral valve  regurgitation. No evidence of mitral stenosis.   4. The aortic valve is normal in structure. There is mild calcification  of the aortic valve. Aortic valve regurgitation is not visualized. Aortic  valve sclerosis is present, with no evidence of aortic valve stenosis.   5. The inferior vena cava is normal in size with greater than 50%  respiratory variability, suggesting right atrial pressure of 3 mmHg.   Laboratory Data:  Chemistry Recent Labs  Lab 02/05/24 1741 02/06/24 0339  NA 138 135  K 4.7 4.1  CL 101 102  CO2 27 21*  GLUCOSE 182* 302*  BUN 35* 36*  CREATININE 1.84* 1.77*  CALCIUM  8.2* 8.1*  GFRNONAA 34* 36*  ANIONGAP 10 13    Recent Labs  Lab 02/05/24 1741  PROT 5.8*  ALBUMIN 3.1*  AST 15  ALT 6  ALKPHOS 75  BILITOT 0.3   Hematology Recent Labs  Lab 02/05/24 1741 02/06/24 0339  WBC 14.1* 13.3*  RBC 3.52* 3.14*  HGB 10.5* 9.5*  HCT 34.5* 30.9*  MCV 98.0 98.4  MCH 29.8 30.3  MCHC 30.4 30.7  RDW 14.6 14.5  PLT 136* 126*   Cardiac EnzymesNo results for input(s): TROPONINI in the last 168 hours. No results for input(s): TROPIPOC in the last 168 hours.  BNP Recent Labs  Lab 02/05/24 2122  PROBNP 2,383.0*    DDimer No results for input(s): DDIMER in the last 168 hours.  Radiology/Studies:  DG  Chest Port 1 View if patient is in a treatment room. Result Date: 02/05/2024 IMPRESSION: 1. Left retrocardiac opacity, likely atelectasis, with small left pleural effusion. 2. Stable cardiomegaly. Electronically signed by: Pinkie Pebbles MD 02/05/2024 06:43 PM EST RP Workstation: [id number]    Assessment and Plan:   1.  CAD status post CABG with precordial pain: - Developed severe chest pain on the morning of 12/14 with stat EKG not meeting STEMI criteria - Currently without symptoms of angina or cardiac decompensation - Cycle troponin to peak/rule out - Repeat echo to evaluate for new cardiomyopathy or wall motion abnormality -  Further recommendations pending - Remains on ASA 81 mg, clopidogrel  75 mg, and rosuvastatin  10 mg  2.  Acute on chronic hypoxic and hypercarbic respiratory failure with sepsis secondary to pneumonia and acute on chronic HFpEF: - No longer requiring BiPAP - Has been weaned to baseline 2 L supplemental oxygen  via nasal cannula - He does continue to appear volume overloaded - Currently on IV Lasix  20 mg daily, recommend continued diuresis with goal net -1 to 2 L per 24 hours - Echo as above with further recommendations regarding pharmacotherapy pending results  3.  MAT: - In the setting of underlying pulmonary illness - Monitor on telemetry  4.  Normocytic anemia/thrombocytopenia: - No obvious bleeding - Likely in setting of chronic disease - Maintain hemoglobin greater than 8  5.  CKD stage III: - Renal function stable - Avoid nephrotoxic agents  6.  PAD status post AKA: - Statin and antiplatelet therapy as outlined above       For questions or updates, please contact [facility] Please consult [email or web address] for contact info under Cardiology/STEMI.   Signed, Bernardino Bring, PA-C [facility] Pager: [phone] 02/07/2024, 10:29 AM       [1]  Allergies Allergen Reactions   Sulfonamide Derivatives     REACTION: pruitis patient  cant remember its been so long     Aspirin  Rash    In high doses   Nitrofurantoin Rash   Penicillins Rash    Did it involve swelling of the face/tongue/throat, SOB, or low BP? No Did it involve sudden or severe rash/hives, skin peeling, or any reaction on the inside of your mouth or nose? No Did you need to seek medical attention at a hospital or doctor's office? No When did it last happen?      Teenager If all above answers are NO, may proceed with cephalosporin use.   Sulfa  Antibiotics Rash

## 2024-02-07 NOTE — Progress Notes (Addendum)
 [facility]  - Agency at [facility]   PATIENT NAME: [name]    MR#:  [id number]  DATE OF BIRTH:  06/07/1930  SUBJECTIVE:  Brother in law at bedside. 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 Came in with increasing shortness of breath.  Rapid response was called since patient felt elephant sitting on the chest. He received Nitro, aspirin , morphine . EKG initially showed low voltage questionable acute MI. Reviewed with STEMI on call cardiology and STEMI was ruled out. Patient currently is chest pain free. Has some cough with productive phlegm. No fever   VITALS:  Blood pressure (!) 159/74, pulse 82, temperature 98.1 F (36.7 C), resp. rate 18, height 5' 8 (1.727 m), weight 99.8 kg, SpO2 98%.  PHYSICAL EXAMINATION:   GENERAL:  89 y.o.-year-old patient with no acute distress. Morbidly obese LUNGS: decreased breath sounds bilaterally, no wheezing CARDIOVASCULAR: S1, S2 normal. No murmur   ABDOMEN: Soft, nontender, nondistended. Bowel sounds present.  EXTREMITIES: No  edema b/l.   Patient has BKA stump NEUROLOGIC: nonfocal  patient is alert and awake SKIN: per RN as below  LABORATORY PANEL:  CBC Recent Labs  Lab 02/06/24 0339  WBC 13.3*  HGB 9.5*  HCT 30.9*  PLT 126*    Chemistries  Recent Labs  Lab 02/05/24 1741 02/06/24 0339 02/07/24 1017  NA 138   < > 139  K 4.7   < > 3.3*  CL 101   < > 103  CO2 27   < > 27  GLUCOSE 182*   < > 136*  BUN 35*   < > 40*  CREATININE 1.84*   < > 1.84*  CALCIUM  8.2*   < > 8.6*  AST 15  --   --   ALT 6  --   --   ALKPHOS 75  --   --   BILITOT 0.3  --   --    < > = values in this interval not displayed.     RADIOLOGY:  DG Chest Port 1 View if patient is in a treatment room. Result Date: 02/05/2024 EXAM: 1 VIEW(S) XRAY OF THE CHEST 02/05/2024 05:50:00 PM COMPARISON: 02/06/2023 CLINICAL HISTORY: Suspected Sepsis FINDINGS: LUNGS AND PLEURA: Low lung volumes with vascular crowding. Left retrocardiac  opacity, likely atelectasis. Left pleural effusion. No pneumothorax. HEART AND MEDIASTINUM: Stable cardiomegaly. Aortic atherosclerotic calcification. Status post CABG. BONES AND SOFT TISSUES: Status post median sternotomy. IMPRESSION: 1. Left retrocardiac opacity, likely atelectasis, with small left pleural effusion. 2. Stable cardiomegaly. Electronically signed by: Bobby Pebbles MD 02/05/2024 06:43 PM EST RP Workstation: [id number]    Assessment and Plan [name]. is a 89 y.o. Caucasian male with medical history significant for anxiety, depression, coronary artery disease, type 2 diabetes mellitus, hypertension, dyslipidemia and PTSD, who presented to the ER with acute onset of worsening dyspnea since Tuesday with associated congested cough with inability to expectorate as well as wheezing, fever and chills.  He was diagnosed with pneumonia on Wednesday at his SNF and was given p.o. doxycycline    He was in significant respiratory distress upon arrival to ED that he had to be placed on BiPAP. Now on [place] oxygen   chest x-ray showed stable cardiomegaly and left retrocardiac opacity likely atelectatic with small left pleural effusion.    Acute hypoxic hypercarbic respiratory failure (HCC) - This is secondary to pneumonia with associated sepsis and congestive heart failure acute diastolic - patient is off BiPAP. Use as needed - Will continue antibiotic  therapy with IV Rocephin  and doxycycline . - Mucolytic therapy be provided as well as duo nebs q.i.d. and q.4 hours p.r.n. -blood culture-- so far negative    Sepsis due to pneumonia [facility]) - This is manifested by leukocytosis, tachycardia and tachypnea. - Management as above. - Pro calcitonin 1.29  - incentive spirometer, flutter valve. Wean oxygen  as tolerated  Congestive heart failure acute diastolic history of coronary artery disease status post CABG in the past Chest pain today --echo in March 2025 showed EF of 55 to 60%-- received  one dose of IV Lasix  -- BNP 2300 -- will give Lasix  20 mg daily -- patient was seen by [facility] cardiology in the past. He is primary doctors are at the  -- [facility] cardiology to see patient. STEMI ruled out discussed with on-call STEMI cardiology Dr. Mady -- patient received aspirin , Nitro sublingual, morphine . -- Troponin pending   CKD stage IIIB -- baseline creatinine around 1.75   Dyslipidemia - Continue statin therapy.   Depression - continue Wellbutrin  XL   Essential hypertension - continue antihypertensive therapy.   Type 2 diabetes mellitus with chronic kidney disease, with long-term current use of insulin  (HCC), peripheral neuropathy - The patient will be placed on supplement coverage with NovoLog . - continue Januvia  and lantus  - Patient takes Lyrica  for diabetic neuropathy. --A1c 6.3  Wound 02/06/24 1111 Pressure Injury Ischial tuberosity Right Stage 2 -  Partial thickness loss of dermis presenting as a shallow open injury with a red, pink wound bed without slough. (Active)     Wound 02/06/24 1111 Pressure Injury Ischial tuberosity Left;Posterior;Proximal Stage 3 -  Full thickness tissue loss. Subcutaneous fat may be visible but bone, tendon or muscle are NOT exposed. (Active)     Wound 02/06/24 1112 Pressure Injury Buttocks Right;Left Stage 3 -  Full thickness tissue loss. Subcutaneous fat may be visible but bone, tendon or muscle are NOT exposed. (Active)   Follow-up wound care consult Long term poor prognosis given multiple co-morbidities     Procedures: Family communication :  brother-in-law at bedside Consults : CODE STATUS: DNR/DNI per out of hospital DNR form DVT Prophylaxis : Lovenox  Level of care: Telemetry Status is: Inpatient Remains inpatient appropriate because: sepsis, CHF, PNA     TOTAL TIME TAKING CARE OF THIS PATIENT: 40 minutes.  >50% time spent on counselling and coordination of care  Note: This dictation was prepared with Dragon dictation  along with smaller phrase technology. Any transcriptional errors that result from this process are unintentional.  [name] M.D    [facility]   CC: Primary care physician; Center, [facility]

## 2024-02-08 ENCOUNTER — Telehealth (HOSPITAL_COMMUNITY): Payer: Self-pay

## 2024-02-08 ENCOUNTER — Other Ambulatory Visit (HOSPITAL_COMMUNITY): Payer: Self-pay

## 2024-02-08 DIAGNOSIS — I5033 Acute on chronic diastolic (congestive) heart failure: Secondary | ICD-10-CM

## 2024-02-08 DIAGNOSIS — I2510 Atherosclerotic heart disease of native coronary artery without angina pectoris: Secondary | ICD-10-CM

## 2024-02-08 DIAGNOSIS — N183 Chronic kidney disease, stage 3 unspecified: Secondary | ICD-10-CM

## 2024-02-08 DIAGNOSIS — J9620 Acute and chronic respiratory failure, unspecified whether with hypoxia or hypercapnia: Secondary | ICD-10-CM

## 2024-02-08 DIAGNOSIS — A419 Sepsis, unspecified organism: Secondary | ICD-10-CM | POA: Diagnosis not present

## 2024-02-08 LAB — BASIC METABOLIC PANEL WITH GFR
Anion gap: 9 (ref 5–15)
BUN: 37 mg/dL — ABNORMAL HIGH (ref 8–23)
CO2: 29 mmol/L (ref 22–32)
Calcium: 8.7 mg/dL — ABNORMAL LOW (ref 8.9–10.3)
Chloride: 100 mmol/L (ref 98–111)
Creatinine, Ser: 1.89 mg/dL — ABNORMAL HIGH (ref 0.61–1.24)
GFR, Estimated: 33 mL/min — ABNORMAL LOW (ref >60)
Glucose, Bld: 163 mg/dL — ABNORMAL HIGH (ref 70–99)
Potassium: 4.1 mmol/L (ref 3.5–5.1)
Sodium: 138 mmol/L (ref 135–145)

## 2024-02-08 LAB — HEPARIN LEVEL (UNFRACTIONATED)
Heparin Unfractionated: 0.38 [IU]/mL (ref 0.30–0.70)
Heparin Unfractionated: 0.77 [IU]/mL — ABNORMAL HIGH (ref 0.30–0.70)

## 2024-02-08 LAB — CULTURE, BLOOD (ROUTINE X 2): Special Requests: ADEQUATE

## 2024-02-08 LAB — CBC
HCT: 31 % — ABNORMAL LOW (ref 39.0–52.0)
Hemoglobin: 10.2 g/dL — ABNORMAL LOW (ref 13.0–17.0)
MCH: 30.5 pg (ref 26.0–34.0)
MCHC: 32.9 g/dL (ref 30.0–36.0)
MCV: 92.8 fL (ref 80.0–100.0)
Platelets: 166 K/uL (ref 150–400)
RBC: 3.34 MIL/uL — ABNORMAL LOW (ref 4.22–5.81)
RDW: 14.1 % (ref 11.5–15.5)
WBC: 13.1 K/uL — ABNORMAL HIGH (ref 4.0–10.5)
nRBC: 0 % (ref 0.0–0.2)

## 2024-02-08 LAB — GLUCOSE, CAPILLARY
Glucose-Capillary: 132 mg/dL — ABNORMAL HIGH (ref 70–99)
Glucose-Capillary: 157 mg/dL — ABNORMAL HIGH (ref 70–99)
Glucose-Capillary: 165 mg/dL — ABNORMAL HIGH (ref 70–99)
Glucose-Capillary: 89 mg/dL (ref 70–99)

## 2024-02-08 LAB — TROPONIN T, HIGH SENSITIVITY
Troponin T High Sensitivity: 175 ng/L (ref 0–19)
Troponin T High Sensitivity: 179 ng/L (ref 0–19)

## 2024-02-08 MED ORDER — ALBUTEROL SULFATE (2.5 MG/3ML) 0.083% IN NEBU
2.5000 mg | INHALATION_SOLUTION | RESPIRATORY_TRACT | Status: DC | PRN
  Administered 2024-02-08 – 2024-02-09 (×2): 2.5 mg via RESPIRATORY_TRACT
  Filled 2024-02-08 (×2): qty 3

## 2024-02-08 MED ORDER — FUROSEMIDE 10 MG/ML IJ SOLN
40.0000 mg | Freq: Two times a day (BID) | INTRAMUSCULAR | Status: DC
  Administered 2024-02-08 – 2024-02-10 (×4): 40 mg via INTRAVENOUS
  Filled 2024-02-08 (×4): qty 4

## 2024-02-08 MED ORDER — DOXYCYCLINE HYCLATE 100 MG PO TABS
100.0000 mg | ORAL_TABLET | Freq: Two times a day (BID) | ORAL | Status: AC
  Administered 2024-02-08 – 2024-02-11 (×6): 100 mg via ORAL
  Filled 2024-02-08 (×6): qty 1

## 2024-02-08 MED ORDER — FUROSEMIDE 10 MG/ML IJ SOLN
40.0000 mg | Freq: Once | INTRAMUSCULAR | Status: AC
  Administered 2024-02-08: 11:00:00 40 mg via INTRAVENOUS
  Filled 2024-02-08: qty 4

## 2024-02-08 NOTE — Progress Notes (Signed)
 Progress Note   Patient: [name]. FMW:[id number] DOB: 1930-06-01 DOA: 02/05/2024     3 DOS: the patient was seen and examined on 02/08/2024   Brief hospital course: Duriel Deery. is a 89 y.o. Caucasian male with medical history significant for anxiety, depression, coronary artery disease, type 2 diabetes mellitus, hypertension, dyslipidemia and PTSD, who presented to the ER with acute onset of worsening dyspnea since Tuesday with associated congested cough with inability to expectorate as well as wheezing, fever and chills.  He was diagnosed with pneumonia on Wednesday at his SNF and was given p.o. doxycycline     He was in significant respiratory distress upon arrival to ED that he had to be placed on BiPAP. Now on [place] oxygen . Chest x-ray showed stable cardiomegaly and left retrocardiac opacity likely atelectatic with small left pleural effusion.  Troponins were elevated, started on heparin  drip as per pharmacy protocol.  Assessment and Plan: * Acute hypoxic, hypercapnic respiratory failure (HCC) In the setting of pneumonia, CHF exacerbation Continue to monitor oxygen  saturations closely, wean supplemental oxygen  as able to.  He is off BiPAP therapy.  BiPAP. Continue antibiotic therapy with IV Rocephin  and Zithromax . Continue mucolytic therapy as well as duo nebs q.i.d. and q.4 hours p.r.n. Cultures so far negative.  Sepsis due to pneumonia [facility]) Presented with leukocytosis, tachycardia and tachypnea. Continue Rocephin , azithromycin .  Pro-Cal 1.29. Staph + blood cultures possibly contamination. Leukocytosis persists, tachycardia and tachypnea improved. Continue to wean supplemental oxygen .  Acute diastolic congestive heart failure- Elevated troponin- Echocardiogram from March 2025 shows EF 50 to 60%. BNP elevated. Cardiology evaluated the patient, increased IV Lasix  to 40 mg twice daily.  Advised to stop heparin , elevated troponin likely in the setting of  sepsis. Patient denies any chest discomfort.  Troponin trended down.  CKD stage IIIb: Baseline creatinine around 2. Continue to monitor daily renal function as he will be on diuretics.  Dyslipidemia Continue statin therapy.  Depression Continue Wellbutrin  XL  Essential hypertension Continue metoprolol  therapy.  Type 2 diabetes mellitus with chronic kidney disease, with long-term current use of insulin  (HCC) Continue Lantus  10 units, accu-Cheks, sliding scale insulin  as per floor protocol. Continue Tradjenta . Hypoglycemia protocol.  Obesity Class I: BMI 33.45. Diet, exercise and weight reduction advised     Out of bed to chair. Incentive spirometry. Nursing supportive care. Fall, aspiration precautions. Diet:  Diet Orders (From admission, onward)     Start     Ordered   02/06/24 0817  DIET DYS 3 Room service appropriate? Yes; Fluid consistency: Thin  Diet effective now       Question Answer Comment  Room service appropriate? Yes   Fluid consistency: Thin      02/06/24 0816           DVT prophylaxis:   Level of care: Progressive   Code Status: Limited: Do not attempt resuscitation (DNR) -DNR-LIMITED -Do Not Intubate/DNI   Subjective: Patient is seen and examined today morning. He is lying in bed. Currently on 2-3 L supplemental oxygen .  Eating poor.  Encouraged incentive spirometry  Physical Exam: Vitals:   02/08/24 0421 02/08/24 0854 02/08/24 1206 02/08/24 1600  BP: (!) 143/69 (!) 101/51 (!) 135/58 (!) 134/51  Pulse: 77 82 80 66  Resp:  19 20 19   Temp: 98.2 F (36.8 C) 98 F (36.7 C) 97.9 F (36.6 C) 98.1 F (36.7 C)  TempSrc: Oral  Oral   SpO2: 94% 100% 99% 100%  Weight:  Height:        General - Elderly ill looking obese Caucasian male, no apparent distress HEENT - PERRLA, EOMI, atraumatic head, non tender sinuses. Lung - Clear, basal rales, no wheezes. Heart - S1, S2 heard, no murmurs, rubs, trace pedal edema. Abdomen - Soft, non  tender, obese, bowel sounds good Neuro - Alert, awake and oriented, non focal exam. Skin - Warm and dry.  Right AKA  Data Reviewed:      Latest Ref Rng & Units 02/08/2024    3:49 AM 02/07/2024    5:47 PM 02/06/2024    3:39 AM  CBC  WBC 4.0 - 10.5 K/uL 13.1  12.0  13.3   Hemoglobin 13.0 - 17.0 g/dL 89.7  89.8  9.5   Hematocrit 39.0 - 52.0 % 31.0  31.3  30.9   Platelets 150 - 400 K/uL 166  179  126       Latest Ref Rng & Units 02/08/2024   10:08 AM 02/07/2024   10:17 AM 02/06/2024    3:39 AM  BMP  Glucose 70 - 99 mg/dL 836  863  697   BUN 8 - 23 mg/dL 37  40  36   Creatinine 0.61 - 1.24 mg/dL 8.10  8.15  8.22   Sodium 135 - 145 mmol/L 138  139  135   Potassium 3.5 - 5.1 mmol/L 4.1  3.3  4.1   Chloride 98 - 111 mmol/L 100  103  102   CO2 22 - 32 mmol/L 29  27  21    Calcium  8.9 - 10.3 mg/dL 8.7  8.6  8.1    No results found.  Family Communication: Discussed with patient, understand and agree. All questions answered.  Disposition: Status is: Inpatient Remains inpatient appropriate because: IV Lasix   Planned Discharge Destination: Skilled nursing facility     Time spent: 45 minutes  Author: Concepcion Riser, MD 02/08/2024 4:14 PM Secure chat 7am to 7pm For on call review [email or web address].

## 2024-02-08 NOTE — Care Management Important Message (Signed)
 Important Message  Patient Details  Name: [name]. MRN: [id number] Date of Birth: 1930-10-05   Important Message Given:  Yes - Medicare IM     [name] 02/08/2024, 2:54 PM

## 2024-02-08 NOTE — Progress Notes (Signed)
 Rounding Note   Patient Name: [name]. Date of Encounter: 02/08/2024  [facility] Cardiologist: None  Subjective Patient reports improvements in shortness of breath.  Reports good UOP yesterday, although poorly recorded.  Still net +2.3 L since admission.  Awaiting echo.  Will continue diuresis.  Scheduled Meds:  aspirin   81 mg Oral Daily   budesonide -glycopyrrolate -formoterol   2 puff Inhalation BID   buPROPion   75 mg Oral BID   calcium -vitamin D   1 tablet Oral BID   clopidogrel   75 mg Oral Daily   docusate  100 mg Oral BID   famotidine   10.4 mg Oral Daily   fluticasone   2 spray Each Nare Daily   Gerhardt's butt cream   Topical BID   guaiFENesin   10 mL Oral QID   insulin  aspart  0-15 Units Subcutaneous TID WC   insulin  aspart  0-5 Units Subcutaneous QHS   insulin  glargine  10 Units Subcutaneous QHS   ipratropium-albuterol   3 mL Nebulization BID   lactulose   20 g Oral Q M,W,F   linagliptin   5 mg Oral Daily   metoprolol  tartrate  12.5 mg Oral Daily   polyethylene glycol  17 g Oral Daily   rosuvastatin   10 mg Oral QHS   Continuous Infusions:  cefTRIAXone  (ROCEPHIN )  IV Stopped (02/08/24 0453)   doxycycline  (VIBRAMYCIN ) IV Stopped (02/08/24 0239)   heparin  1,150 Units/hr (02/08/24 0700)   PRN Meds: acetaminophen  **OR** acetaminophen , chlorpheniramine-HYDROcodone , diclofenac  Sodium, nitroGLYCERIN , ondansetron  **OR** ondansetron  (ZOFRAN ) IV, senna-docusate, traZODone    Vital Signs  Vitals:   02/07/24 2224 02/08/24 0033 02/08/24 0421 02/08/24 0854  BP: (!) 135/36 (!) 102/56 (!) 143/69 (!) 101/51  Pulse: 76 74 77 82  Resp: 20 18  19   Temp: 98 F (36.7 C) 98.5 F (36.9 C) 98.2 F (36.8 C) 98 F (36.7 C)  TempSrc: Axillary Oral Oral   SpO2: 100% 97% 94% 100%  Weight:      Height:        Intake/Output Summary (Last 24 hours) at 02/08/2024 0944 Last data filed at 02/08/2024 0700 Gross per 24 hour  Intake 1072.66 ml  Output 400 ml  Net 672.66  ml      02/05/2024    8:39 PM 05/12/2023    3:13 PM 04/28/2023   12:16 AM  Last 3 Weights  Weight (lbs) 220 lb 216 lb 0.8 oz 218 lb  Weight (kg) 99.791 kg 98 kg 98.884 kg      Telemetry Sinus rhythm with wandering atrial pacemaker - Personally Reviewed  Physical Exam  GEN: No acute distress.   Neck: No JVD Cardiac: RRR, no murmurs, rubs, or gallops appreciated but difficult to hear over breath sounds Respiratory: Diffuse rhonchi and wheezing GI: Soft, nontender, non-distended  MS: S/p R AKA; 1-2+ edema; No deformity. Neuro:  Nonfocal  Psych: Normal affect   Labs High Sensitivity Troponin:  No results for input(s): TROPONINIHS in the last 720 hours.  Recent Labs  Lab 02/07/24 1015 02/07/24 1515  TRNPT 194* 206*       Chemistry Recent Labs  Lab 02/05/24 1741 02/06/24 0339 02/07/24 1017  NA 138 135 139  K 4.7 4.1 3.3*  CL 101 102 103  CO2 27 21* 27  GLUCOSE 182* 302* 136*  BUN 35* 36* 40*  CREATININE 1.84* 1.77* 1.84*  CALCIUM  8.2* 8.1* 8.6*  PROT 5.8*  --   --   ALBUMIN 3.1*  --   --   AST 15  --   --  ALT 6  --   --   ALKPHOS 75  --   --   BILITOT 0.3  --   --   GFRNONAA 34* 36* 34*  ANIONGAP 10 13 9     Lipids No results for input(s): CHOL, TRIG, HDL, LABVLDL, LDLCALC, CHOLHDL in the last 168 hours.  Hematology Recent Labs  Lab 02/06/24 0339 02/07/24 1747 02/08/24 0349  WBC 13.3* 12.0* 13.1*  RBC 3.14* 3.33* 3.34*  HGB 9.5* 10.1* 10.2*  HCT 30.9* 31.3* 31.0*  MCV 98.4 94.0 92.8  MCH 30.3 30.3 30.5  MCHC 30.7 32.3 32.9  RDW 14.5 14.2 14.1  PLT 126* 179 166   Thyroid  No results for input(s): TSH, FREET4 in the last 168 hours.  BNP Recent Labs  Lab 02/05/24 2122  PROBNP 2,383.0*    DDimer No results for input(s): DDIMER in the last 168 hours.   Radiology  No results found.  Cardiac Studies  2D echo 05/14/2023: 1. Left ventricular ejection fraction, by estimation, is 55 to 60%. The  left ventricle has normal  function. The left ventricle has no regional  wall motion abnormalities. There is mild left ventricular hypertrophy.  Left ventricular diastolic parameters  are indeterminate.   2. Right ventricular systolic function is normal. The right ventricular  size is normal.   3. The mitral valve is normal in structure. Mild mitral valve  regurgitation. No evidence of mitral stenosis.   4. The aortic valve is normal in structure. There is mild calcification  of the aortic valve. Aortic valve regurgitation is not visualized. Aortic  valve sclerosis is present, with no evidence of aortic valve stenosis.   5. The inferior vena cava is normal in size with greater than 50%  respiratory variability, suggesting right atrial pressure of 3 mmHg.   Patient Profile   89 y.o. male with a hx of agent orange exposure, CAD status post remote CABG in 1998, HFpEF, chronic hypoxic respiratory failure on supplemental oxygen  via nasal cannula at 2 L, COPD, PAD status post AKA, DM2, CKD stage III, HTN, HLD, PTSD, carotid artery disease, and normocytic anemia who is being seen for the ongoing management of chest pain.   Assessment & Plan   Coronary artery disease Precordial pain - S/p CABG, details are unclear - Initially presenting 12/12 for shortness of breath. Developed sharp chest pain while admitted on 12/14 with EKG not meeting STEMI criteria.  - Troponin 194 > 206 - No further chest pain - Echo ordered with further recommendations pending results - Continue IV heparin   - Continue aspirin , clopidogrel , and rosuvastatin   Acute on chronic hypoxic and hypercarbic respiratory failure Sepsis secondary to pneumonia Acute on chronic HFpEF - Initially requiring BiPAP, now weaned to 2 L supplemental oxygen  via nasal cannula - Volume status appears to be improving, although remains overloaded - I/Os poorly recorded. Patient reports good UOP yesterday. Remains net +2.3 L since admission.  - Continue to monitor kidney  function, strict I/Os, and daily weights with ongoing diuresis - BMP ordered - Will give Lasix  40 mg twice daily - Echo as above  MAT - In the setting of underlying pulmonary illness - Continue telemetry monitoring  Normocytic anemia Thrombocytopenia - No obvious bleeding - Likely in the setting of chronic disease - Maintain hemoglobin greater than 8  CKD III - Update BMP - Continue to avoid nephrotoxic agents  PAD s/p AKA - Statin and antiplatelet therapy as above    For questions or updates, please contact [facility] Please consult [email or web address] for contact info under       Signed, Lesley LITTIE Maffucci, PA-C  02/08/2024, 9:44 AM

## 2024-02-08 NOTE — Consult Note (Signed)
 Pharmacy Consult Note - Anticoagulation  Pharmacy Consult for heparin  infusion Indication: chest pain/ACS Allergies[1]  PATIENT MEASUREMENTS: Height: 5' 8 (172.7 cm) Weight: 99.8 kg (220 lb) IBW/kg (Calculated) : 68.4 HEPARIN  DW (KG): 89.8  VITAL SIGNS: Temp: 98.5 F (36.9 C) (12/15 0033) Temp Source: Oral (12/15 0033) BP: 102/56 (12/15 0033) Pulse Rate: 74 (12/15 0033)  Recent Labs    02/07/24 1017 02/07/24 1747 02/08/24 0349  HGB  --  10.1* 10.2*  HCT  --  31.3* 31.0*  PLT  --  179 166  APTT  --  30  --   LABPROT  --  12.8  --   INR  --  0.9  --   HEPARINUNFRC  --   --  0.38  CREATININE 1.84*  --   --     Estimated Creatinine Clearance: 30 mL/min (A) (by C-G formula based on SCr of 1.84 mg/dL (H)).  PAST MEDICAL HISTORY: Past Medical History:  Diagnosis Date   Agent orange exposure 1966 or 1971   Anemia    Anxiety    Arthritis    hands and back (01/20/2013)   CAD (coronary artery disease)    native vessel   Carotid artery disease    nonobstructive   Cataract    Cecal diverticulitis 2008   drained   Cellulitis, gluteal    bilateral for the past 6 months/notes 01/20/2013   Chronic lower back pain    Chronic renal insufficiency    Depression    Diabetes mellitus type II    Exertional shortness of breath    HTN (hypertension)    Hyperlipidemia    Hypokalemia    Malnutrition    protein-calorie   Myocardial infarction (HCC)    silent; before OHS (01/20/2013)   PTSD (post-traumatic stress disorder)    Spina bifida (HCC)    Urinary incontinence     Medications:  Medications Prior to Admission  Medication Sig Dispense Refill Last Dose/Taking   acetaminophen  (TYLENOL ) 325 MG tablet Take 650 mg by mouth every 6 (six) hours as needed.   Taking As Needed   Ashwagandha 300 MG TABS Take 1 capsule by mouth at bedtime. Take 460 mg by mouth at bedtime.   02/05/2024   aspirin  81 MG tablet Take 81 mg by mouth daily.   02/05/2024   buPROPion  (WELLBUTRIN   XL) 150 MG 24 hr tablet Take 150 mg by mouth daily.   02/05/2024   calcium -vitamin D  (OSCAL WITH D) 500-5 MG-MCG tablet Take 1 tablet by mouth 2 (two) times daily. 180 tablet 1 02/05/2024   carboxymethylcellulose (REFRESH PLUS) 0.5 % SOLN Place 1 drop into both eyes QID.   02/05/2024   clopidogrel  (PLAVIX ) 75 MG tablet Take 1 tablet (75 mg total) by mouth daily. 30 tablet 4 02/05/2024   Coenzyme Q10 (CO Q-10) 100 MG CAPS Take 1 capsule by mouth daily.   02/05/2024   diclofenac  Sodium (VOLTAREN ) 1 % GEL Apply topically. APPLY 2 GRAMS TOPICALLY TO EACH SHOULDER EVERY 6 HOURS AS NEEDED   Taking   docusate sodium  (COLACE) 100 MG capsule Take 100 mg by mouth 2 (two) times daily.   02/05/2024   doxycycline  (VIBRAMYCIN ) 100 MG capsule Take 100 mg by mouth 2 (two) times daily.   02/05/2024 Morning   famotidine  (PEPCID ) 10 MG tablet Take 10 mg by mouth daily.   02/05/2024   faricimab-svoa (VABYSMO) 6 MG/0.05ML SOSY intravitreal injection 6 mg by Intravitreal route 3 (three) times daily. Administered by the [facility]  3 times a day on Friday every 5 weeks.   Taking   ferrous sulfate 300 (60 Fe) MG/5ML syrup Take 300 mg by mouth in the morning and at bedtime.   02/05/2024 Morning   fluticasone  (FLONASE ) 50 MCG/ACT nasal spray Place 2 sprays into both nostrils daily.   02/05/2024   Fluticasone -Umeclidin-Vilant (TRELEGY ELLIPTA ) 100-62.5-25 MCG/ACT AEPB Inhale 1 puff into the lungs daily. 60 each 11 02/05/2024   GENERLAC  10 GM/15ML SOLN Take 30 mLs by mouth every Monday, Wednesday, and Friday.   02/05/2024   guaiFENesin  (MUCINEX ) 600 MG 12 hr tablet Take 600 mg by mouth every 12 (twelve) hours as needed.   02/05/2024   Insulin  Glargine (BASAGLAR  KWIKPEN) 100 UNIT/ML Inject 10 Units into the skin at bedtime.   02/04/2024   insulin  lispro (HUMALOG) 100 UNIT/ML KwikPen Inject subcutaneously as directed per sliding scale.   02/02/2024   ipratropium-albuterol  (DUONEB) 0.5-2.5 (3) MG/3ML SOLN 1 NEBULIZER SOLUTION ( )  BY ORAL INHALATION EVERY 4 TO 6 HOURS AS NEEDED   Taking   JANUVIA  25 MG tablet Take 25 mg by mouth daily.   02/05/2024   lidocaine  (LINDAMANTLE) 3 % CREA cream Apply 1 Application topically as needed. Apply to left shoulder every 6 hours as needed along with diclofenac  gel.   Taking As Needed   lidocaine  4 % Place 1 patch onto the skin daily. Apply topically to left chest. Remove at bedtime.   02/05/2024   metoprolol  tartrate (LOPRESSOR ) 25 MG tablet Take 0.5 tablets (12.5 mg total) by mouth 2 (two) times daily. (Patient taking differently: Take 12.5 mg by mouth daily.) 90 tablet 3 02/05/2024 Morning   polyethylene glycol powder (GLYCOLAX /MIRALAX ) 17 GM/SCOOP powder Take 17 g by mouth daily. 255 g 0 02/05/2024   pregabalin  (LYRICA ) 50 MG capsule Take 50 mg by mouth 2 (two) times daily.   02/05/2024 Morning   rosuvastatin  (CRESTOR ) 10 MG tablet Take 10 mg by mouth at bedtime.   02/04/2024   senna (SENOKOT) 8.6 MG tablet Take 2 tablets by mouth 2 (two) times daily.   Past Month   Sodium Phosphates (FLEET SALINE ENEMA) 7-19 GM/197ML ENEM Place rectally once.   Past Week   albuterol  (VENTOLIN  HFA) 108 (90 Base) MCG/ACT inhaler Inhale 2 puffs into the lungs every 6 (six) hours as needed for wheezing or shortness of breath. 18 g 6    ARTIFICIAL TEARS 0.2-0.2-1 % SOLN Apply to eye 3 (three) times daily. Instill 2 drops into each eye 3 times daily. (Patient not taking: Reported on 02/05/2024)   Not Taking   lactulose  (CHRONULAC ) 10 GM/15ML solution Take 20 g by mouth 2 (two) times daily.      Multiple Vitamins-Minerals (PRESERVISION AREDS 2 PO) Take 1 capsule by mouth 2 (two) times daily. (Patient not taking: Reported on 02/05/2024)   Not Taking   nystatin  (MYCOSTATIN /NYSTOP ) powder Apply topically 2 (two) times daily. Apply to groin and perineum twice a day (Patient taking differently: Apply topically 3 (three) times daily. Apply to groin and perineum every shift) 15 g 0    pregabalin  (LYRICA ) 25 MG capsule  Take 25 mg by mouth daily as needed. (Patient not taking: Reported on 02/05/2024)   Not Taking   senna-docusate (SENOKOT-S) 8.6-50 MG tablet Take 1 tablet by mouth at bedtime as needed for mild constipation. 30 tablet 1    simethicone  (GAS-X) 80 MG chewable tablet Chew 1 tablet (80 mg total) by mouth 4 (four) times daily as needed for flatulence. 60 tablet  0    Scheduled:   aspirin   81 mg Oral Daily   budesonide -glycopyrrolate -formoterol   2 puff Inhalation BID   buPROPion   75 mg Oral BID   calcium -vitamin D   1 tablet Oral BID   clopidogrel   75 mg Oral Daily   docusate  100 mg Oral BID   famotidine   10.4 mg Oral Daily   fluticasone   2 spray Each Nare Daily   Gerhardt's butt cream   Topical BID   guaiFENesin   10 mL Oral QID   insulin  aspart  0-15 Units Subcutaneous TID WC   insulin  aspart  0-5 Units Subcutaneous QHS   insulin  glargine  10 Units Subcutaneous QHS   ipratropium-albuterol   3 mL Nebulization BID   lactulose   20 g Oral Q M,W,F   linagliptin   5 mg Oral Daily   metoprolol  tartrate  12.5 mg Oral Daily   polyethylene glycol  17 g Oral Daily   rosuvastatin   10 mg Oral QHS   Infusions:   cefTRIAXone  (ROCEPHIN )  IV 200 mL/hr at 02/08/24 0100   doxycycline  (VIBRAMYCIN ) IV Stopped (02/08/24 0239)   heparin  1,150 Units/hr (02/08/24 0400)   PRN: acetaminophen  **OR** acetaminophen , chlorpheniramine-HYDROcodone , diclofenac  Sodium, nitroGLYCERIN , ondansetron  **OR** ondansetron  (ZOFRAN ) IV, senna-docusate, traZODone   ASSESSMENT: 89 y.o. male with PMH CAD,HF, s/p CABG in 1989 is presenting with rapid response SOB, elevated troponin, and possible MI with cardiology consult. Patient is not on chronic anticoagulation per chart review. Was on LMWH on admission. Last dose on 12/13 at 2255. Pharmacy has been consulted to initiate and manage heparin  intravenous infusion.   Goal(s) of therapy: Heparin  level 0.3 - 0.7 units/mL aPTT 66 - 102 seconds Monitor platelets by anticoagulation  protocol: Yes   Baseline anticoagulation labs: Recent Labs    02/05/24 1741 02/06/24 0339 02/07/24 1747 02/08/24 0349  APTT  --   --  30  --   INR 1.0 1.0 0.9  --   HGB 10.5* 9.5* 10.1* 10.2*  PLT 136* 126* 179 166   12/15 0349 HL 0.38, therapeutic x 1   PLAN: Continue heparin  infusion at 1,150 units/hour. Recheck heparin  level in 8 hours to confirm Monitor CBC daily while on heparin  infusion.  Rankin CANDIE Dills, PharmD, [facility] 02/08/2024 4:16 AM      [1]  Allergies Allergen Reactions   Sulfonamide Derivatives     REACTION: pruitis patient cant remember its been so long     Aspirin  Rash    In high doses   Nitrofurantoin Rash   Penicillins Rash    Did it involve swelling of the face/tongue/throat, SOB, or low BP? No Did it involve sudden or severe rash/hives, skin peeling, or any reaction on the inside of your mouth or nose? No Did you need to seek medical attention at a hospital or doctor's office? No When did it last happen?      Teenager If all above answers are NO, may proceed with cephalosporin use.   Sulfa  Antibiotics Rash

## 2024-02-08 NOTE — Telephone Encounter (Signed)
 Pharmacy Patient Advocate Encounter  Insurance verification completed.    The patient is insured through [employer].     Ran test claim for Jardiance 10mg  tablet and the current 30 day co-pay is $43.00.  Ran test claim for Farxiga 10mg  tablet and it requires a PA.  This test claim was processed through  [facility]- copay amounts may vary at other pharmacies due to pharmacy/plan contracts, or as the patient moves through the different stages of their insurance plan.

## 2024-02-08 NOTE — Consult Note (Signed)
 Pharmacy Consult Note - Anticoagulation  Pharmacy Consult for heparin  infusion Indication: chest pain/ACS Allergies[1]  PATIENT MEASUREMENTS: Height: 5' 8 (172.7 cm) Weight: 99.8 kg (220 lb) IBW/kg (Calculated) : 68.4 HEPARIN  DW (KG): 89.8  VITAL SIGNS: Temp: 97.9 F (36.6 C) (12/15 1206) Temp Source: Oral (12/15 1206) BP: 135/58 (12/15 1206) Pulse Rate: 80 (12/15 1206)  Recent Labs    02/07/24 1747 02/07/24 1747 02/08/24 0349 02/08/24 1008 02/08/24 1257  HGB 10.1*  --  10.2*  --   --   HCT 31.3*  --  31.0*  --   --   PLT 179  --  166  --   --   APTT 30  --   --   --   --   LABPROT 12.8  --   --   --   --   INR 0.9  --   --   --   --   HEPARINUNFRC  --    < > 0.38  --  0.77*  CREATININE  --   --   --  1.89*  --    < > = values in this interval not displayed.    Estimated Creatinine Clearance: 29.2 mL/min (A) (by C-G formula based on SCr of 1.89 mg/dL (H)).  PAST MEDICAL HISTORY: Past Medical History:  Diagnosis Date   Agent orange exposure 1966 or 1971   Anemia    Anxiety    Arthritis    hands and back (01/20/2013)   CAD (coronary artery disease)    native vessel   Carotid artery disease    nonobstructive   Cataract    Cecal diverticulitis 2008   drained   Cellulitis, gluteal    bilateral for the past 6 months/notes 01/20/2013   Chronic lower back pain    Chronic renal insufficiency    Depression    Diabetes mellitus type II    Exertional shortness of breath    HTN (hypertension)    Hyperlipidemia    Hypokalemia    Malnutrition    protein-calorie   Myocardial infarction (HCC)    silent; before OHS (01/20/2013)   PTSD (post-traumatic stress disorder)    Spina bifida (HCC)    Urinary incontinence     Medications:  Medications Prior to Admission  Medication Sig Dispense Refill Last Dose/Taking   acetaminophen  (TYLENOL ) 325 MG tablet Take 650 mg by mouth every 6 (six) hours as needed.   Taking As Needed   Ashwagandha 300 MG TABS Take 1  capsule by mouth at bedtime. Take 460 mg by mouth at bedtime.   02/05/2024   aspirin  81 MG tablet Take 81 mg by mouth daily.   02/05/2024   buPROPion  (WELLBUTRIN  XL) 150 MG 24 hr tablet Take 150 mg by mouth daily.   02/05/2024   calcium -vitamin D  (OSCAL WITH D) 500-5 MG-MCG tablet Take 1 tablet by mouth 2 (two) times daily. 180 tablet 1 02/05/2024   carboxymethylcellulose (REFRESH PLUS) 0.5 % SOLN Place 1 drop into both eyes QID.   02/05/2024   clopidogrel  (PLAVIX ) 75 MG tablet Take 1 tablet (75 mg total) by mouth daily. 30 tablet 4 02/05/2024   Coenzyme Q10 (CO Q-10) 100 MG CAPS Take 1 capsule by mouth daily.   02/05/2024   diclofenac  Sodium (VOLTAREN ) 1 % GEL Apply topically. APPLY 2 GRAMS TOPICALLY TO EACH SHOULDER EVERY 6 HOURS AS NEEDED   Taking   docusate sodium  (COLACE) 100 MG capsule Take 100 mg by mouth 2 (two) times daily.  02/05/2024   doxycycline  (VIBRAMYCIN ) 100 MG capsule Take 100 mg by mouth 2 (two) times daily.   02/05/2024 Morning   famotidine  (PEPCID ) 10 MG tablet Take 10 mg by mouth daily.   02/05/2024   faricimab-svoa (VABYSMO) 6 MG/0.05ML SOSY intravitreal injection 6 mg by Intravitreal route 3 (three) times daily. Administered by the [facility] 3 times a day on Friday every 5 weeks.   Taking   ferrous sulfate 300 (60 Fe) MG/5ML syrup Take 300 mg by mouth in the morning and at bedtime.   02/05/2024 Morning   fluticasone  (FLONASE ) 50 MCG/ACT nasal spray Place 2 sprays into both nostrils daily.   02/05/2024   Fluticasone -Umeclidin-Vilant (TRELEGY ELLIPTA ) 100-62.5-25 MCG/ACT AEPB Inhale 1 puff into the lungs daily. 60 each 11 02/05/2024   GENERLAC  10 GM/15ML SOLN Take 30 mLs by mouth every Monday, Wednesday, and Friday.   02/05/2024   guaiFENesin  (MUCINEX ) 600 MG 12 hr tablet Take 600 mg by mouth every 12 (twelve) hours as needed.   02/05/2024   Insulin  Glargine (BASAGLAR  KWIKPEN) 100 UNIT/ML Inject 10 Units into the skin at bedtime.   02/04/2024   insulin  lispro (HUMALOG) 100  UNIT/ML KwikPen Inject subcutaneously as directed per sliding scale.   02/02/2024   ipratropium-albuterol  (DUONEB) 0.5-2.5 (3) MG/3ML SOLN 1 NEBULIZER SOLUTION ( ) BY ORAL INHALATION EVERY 4 TO 6 HOURS AS NEEDED   Taking   JANUVIA  25 MG tablet Take 25 mg by mouth daily.   02/05/2024   lidocaine  (LINDAMANTLE) 3 % CREA cream Apply 1 Application topically as needed. Apply to left shoulder every 6 hours as needed along with diclofenac  gel.   Taking As Needed   lidocaine  4 % Place 1 patch onto the skin daily. Apply topically to left chest. Remove at bedtime.   02/05/2024   metoprolol  tartrate (LOPRESSOR ) 25 MG tablet Take 0.5 tablets (12.5 mg total) by mouth 2 (two) times daily. (Patient taking differently: Take 12.5 mg by mouth daily.) 90 tablet 3 02/05/2024 Morning   polyethylene glycol powder (GLYCOLAX /MIRALAX ) 17 GM/SCOOP powder Take 17 g by mouth daily. 255 g 0 02/05/2024   pregabalin  (LYRICA ) 50 MG capsule Take 50 mg by mouth 2 (two) times daily.   02/05/2024 Morning   rosuvastatin  (CRESTOR ) 10 MG tablet Take 10 mg by mouth at bedtime.   02/04/2024   senna (SENOKOT) 8.6 MG tablet Take 2 tablets by mouth 2 (two) times daily.   Past Month   Sodium Phosphates (FLEET SALINE ENEMA) 7-19 GM/197ML ENEM Place rectally once.   Past Week   albuterol  (VENTOLIN  HFA) 108 (90 Base) MCG/ACT inhaler Inhale 2 puffs into the lungs every 6 (six) hours as needed for wheezing or shortness of breath. 18 g 6    ARTIFICIAL TEARS 0.2-0.2-1 % SOLN Apply to eye 3 (three) times daily. Instill 2 drops into each eye 3 times daily. (Patient not taking: Reported on 02/05/2024)   Not Taking   lactulose  (CHRONULAC ) 10 GM/15ML solution Take 20 g by mouth 2 (two) times daily.      Multiple Vitamins-Minerals (PRESERVISION AREDS 2 PO) Take 1 capsule by mouth 2 (two) times daily. (Patient not taking: Reported on 02/05/2024)   Not Taking   nystatin  (MYCOSTATIN /NYSTOP ) powder Apply topically 2 (two) times daily. Apply to groin and perineum  twice a day (Patient taking differently: Apply topically 3 (three) times daily. Apply to groin and perineum every shift) 15 g 0    pregabalin  (LYRICA ) 25 MG capsule Take 25 mg by mouth daily as  needed. (Patient not taking: Reported on 02/05/2024)   Not Taking   senna-docusate (SENOKOT-S) 8.6-50 MG tablet Take 1 tablet by mouth at bedtime as needed for mild constipation. 30 tablet 1    simethicone  (GAS-X) 80 MG chewable tablet Chew 1 tablet (80 mg total) by mouth 4 (four) times daily as needed for flatulence. 60 tablet 0    Scheduled:   aspirin   81 mg Oral Daily   budesonide -glycopyrrolate -formoterol   2 puff Inhalation BID   buPROPion   75 mg Oral BID   calcium -vitamin D   1 tablet Oral BID   clopidogrel   75 mg Oral Daily   docusate  100 mg Oral BID   doxycycline   100 mg Oral Q12H   famotidine   10.4 mg Oral Daily   fluticasone   2 spray Each Nare Daily   furosemide   40 mg Intravenous BID   Gerhardt's butt cream   Topical BID   guaiFENesin   10 mL Oral QID   insulin  aspart  0-15 Units Subcutaneous TID WC   insulin  aspart  0-5 Units Subcutaneous QHS   insulin  glargine  10 Units Subcutaneous QHS   ipratropium-albuterol   3 mL Nebulization BID   lactulose   20 g Oral Q M,W,F   linagliptin   5 mg Oral Daily   metoprolol  tartrate  12.5 mg Oral Daily   polyethylene glycol  17 g Oral Daily   rosuvastatin   10 mg Oral QHS   Infusions:   cefTRIAXone  (ROCEPHIN )  IV Stopped (02/08/24 0453)   heparin  1,150 Units/hr (02/08/24 0700)   PRN: acetaminophen  **OR** acetaminophen , chlorpheniramine-HYDROcodone , diclofenac  Sodium, nitroGLYCERIN , ondansetron  **OR** ondansetron  (ZOFRAN ) IV, senna-docusate, traZODone   ASSESSMENT: 89 y.o. male with PMH CAD,HF, s/p CABG in 1989 is presenting with rapid response SOB, elevated troponin, and possible MI with cardiology consult. Patient is not on chronic anticoagulation per chart review. Was on LMWH on admission. Last dose on 12/13 at 2255. Pharmacy has been consulted to  initiate and manage heparin  intravenous infusion.  12/15 0349 HL 0.38, therapeutic x 1  12/15 1257 HL 0.77    Goal(s) of therapy: Heparin  level 0.3 - 0.7 units/mL aPTT 66 - 102 seconds Monitor platelets by anticoagulation protocol: Yes   Baseline anticoagulation labs: Recent Labs    02/05/24 1741 02/06/24 0339 02/07/24 1747 02/08/24 0349  APTT  --   --  30  --   INR 1.0 1.0 0.9  --   HGB 10.5* 9.5* 10.1* 10.2*  PLT 136* 126* 179 166    PLAN: Heparin  level is supratherapeutic. Will decrease heparin  infusion to 1050 units/hr. Recheck heparin  level in 8 hours. CBC daily while on heparin .   Cathaleen Blanch, PharmD,  02/08/2024 2:16 PM       [1]  Allergies Allergen Reactions   Sulfonamide Derivatives     REACTION: pruitis patient cant remember its been so long     Aspirin  Rash    In high doses   Nitrofurantoin Rash   Penicillins Rash    Did it involve swelling of the face/tongue/throat, SOB, or low BP? No Did it involve sudden or severe rash/hives, skin peeling, or any reaction on the inside of your mouth or nose? No Did you need to seek medical attention at a hospital or doctor's office? No When did it last happen?      Teenager If all above answers are NO, may proceed with cephalosporin use.   Sulfa  Antibiotics Rash

## 2024-02-09 ENCOUNTER — Inpatient Hospital Stay (HOSPITAL_COMMUNITY)
Admit: 2024-02-09 | Discharge: 2024-02-09 | Disposition: A | Discharge status: Home / Self Care | Attending: Physician Assistant | Admitting: Physician Assistant

## 2024-02-09 DIAGNOSIS — R079 Chest pain, unspecified: Secondary | ICD-10-CM

## 2024-02-09 DIAGNOSIS — I2489 Other forms of acute ischemic heart disease: Secondary | ICD-10-CM

## 2024-02-09 LAB — CBC
HCT: 32.7 % — ABNORMAL LOW (ref 39.0–52.0)
Hemoglobin: 10.5 g/dL — ABNORMAL LOW (ref 13.0–17.0)
MCH: 30.3 pg (ref 26.0–34.0)
MCHC: 32.1 g/dL (ref 30.0–36.0)
MCV: 94.2 fL (ref 80.0–100.0)
Platelets: 173 K/uL (ref 150–400)
RBC: 3.47 MIL/uL — ABNORMAL LOW (ref 4.22–5.81)
RDW: 14.2 % (ref 11.5–15.5)
WBC: 10 K/uL (ref 4.0–10.5)
nRBC: 0 % (ref 0.0–0.2)

## 2024-02-09 LAB — BASIC METABOLIC PANEL WITH GFR
Anion gap: 5 (ref 5–15)
BUN: 35 mg/dL — ABNORMAL HIGH (ref 8–23)
CO2: 33 mmol/L — ABNORMAL HIGH (ref 22–32)
Calcium: 8.8 mg/dL — ABNORMAL LOW (ref 8.9–10.3)
Chloride: 98 mmol/L (ref 98–111)
Creatinine, Ser: 1.92 mg/dL — ABNORMAL HIGH (ref 0.61–1.24)
GFR, Estimated: 32 mL/min — ABNORMAL LOW (ref >60)
Glucose, Bld: 97 mg/dL (ref 70–99)
Potassium: 3.9 mmol/L (ref 3.5–5.1)
Sodium: 136 mmol/L (ref 135–145)

## 2024-02-09 LAB — GLUCOSE, CAPILLARY
Glucose-Capillary: 116 mg/dL — ABNORMAL HIGH (ref 70–99)
Glucose-Capillary: 163 mg/dL — ABNORMAL HIGH (ref 70–99)
Glucose-Capillary: 212 mg/dL — ABNORMAL HIGH (ref 70–99)
Glucose-Capillary: 156 mg/dL — ABNORMAL HIGH (ref 70–99)

## 2024-02-09 LAB — ECHOCARDIOGRAM COMPLETE
Area-P 1/2: 4.41 cm2
Est EF: >55
Height: 68 in
S' Lateral: 2.6 cm
Weight: 3516.78 [oz_av]

## 2024-02-09 MED ORDER — METOPROLOL SUCCINATE ER 25 MG PO TB24
12.5000 mg | ORAL_TABLET | Freq: Every day | ORAL | Status: DC
  Administered 2024-02-09 – 2024-02-10 (×2): 12.5 mg via ORAL
  Filled 2024-02-09 (×2): qty 1

## 2024-02-09 MED ORDER — PERFLUTREN LIPID MICROSPHERE
1.0000 mL | INTRAVENOUS | Status: AC | PRN
  Administered 2024-02-09: 14:00:00 3 mL via INTRAVENOUS

## 2024-02-09 NOTE — Plan of Care (Signed)
  Problem: Fluid Volume: Goal: Hemodynamic stability will improve Outcome: Progressing   Problem: Clinical Measurements: Goal: Diagnostic test results will improve Outcome: Progressing   Problem: Respiratory: Goal: Ability to maintain adequate ventilation will improve Outcome: Progressing

## 2024-02-09 NOTE — Progress Notes (Signed)
 Rounding Note   Patient Name: [name]. Date of Encounter: 02/09/2024  [facility] Cardiologist: None  Subjective Patient reports ongoing improvements in dyspnea.  Lower extremity edema appears to be improving as well.  Now slightly net negative since admission.  Renal function overall stable.  Continue IV diuresis. Awaiting echo still.   Scheduled Meds:  aspirin   81 mg Oral Daily   budesonide -glycopyrrolate -formoterol   2 puff Inhalation BID   buPROPion   75 mg Oral BID   calcium -vitamin D   1 tablet Oral BID   clopidogrel   75 mg Oral Daily   docusate  100 mg Oral BID   doxycycline   100 mg Oral Q12H   famotidine   10.4 mg Oral Daily   fluticasone   2 spray Each Nare Daily   furosemide   40 mg Intravenous BID   Gerhardt's butt cream   Topical BID   guaiFENesin   10 mL Oral QID   insulin  aspart  0-15 Units Subcutaneous TID WC   insulin  aspart  0-5 Units Subcutaneous QHS   insulin  glargine  10 Units Subcutaneous QHS   ipratropium-albuterol   3 mL Nebulization BID   lactulose   20 g Oral Q M,W,F   linagliptin   5 mg Oral Daily   metoprolol  succinate  12.5 mg Oral Daily   polyethylene glycol  17 g Oral Daily   rosuvastatin   10 mg Oral QHS   Continuous Infusions:  cefTRIAXone  (ROCEPHIN )  IV 2 g (02/09/24 0416)   PRN Meds: acetaminophen  **OR** acetaminophen , albuterol , chlorpheniramine-HYDROcodone , diclofenac  Sodium, nitroGLYCERIN , ondansetron  **OR** ondansetron  (ZOFRAN ) IV, senna-docusate, traZODone    Vital Signs  Vitals:   02/09/24 0020 02/09/24 0424 02/09/24 0500 02/09/24 0748  BP: (!) 111/56 113/64  (!) 129/56  Pulse: 75 80  80  Resp: 17 18  19   Temp: 98.6 F (37 C) 98.8 F (37.1 C)  98.1 F (36.7 C)  TempSrc:    Oral  SpO2: 98% (!) 89%  100%  Weight:   99.7 kg   Height:        Intake/Output Summary (Last 24 hours) at 02/09/2024 0927 Last data filed at 02/09/2024 0749 Gross per 24 hour  Intake 121.12 ml  Output 2550 ml  Net -2428.88 ml       02/09/2024    5:00 AM 02/05/2024    8:39 PM 05/12/2023    3:13 PM  Last 3 Weights  Weight (lbs) 219 lb 12.8 oz 220 lb 216 lb 0.8 oz  Weight (kg) 99.7 kg 99.791 kg 98 kg      Telemetry Sinus rhythm - Personally Reviewed  Physical Exam  GEN: No acute distress.   Neck: No JVD Cardiac: RRR, no murmurs, rubs, or gallops appreciated but difficult to hear over breath sounds Respiratory: Diffuse rhonchi and wheezing GI: Soft, nontender, non-distended  MS: S/p R AKA; 1+ edema; No deformity. Neuro:  Nonfocal  Psych: Normal affect   Labs High Sensitivity Troponin:  No results for input(s): TROPONINIHS in the last 720 hours.  Recent Labs  Lab 02/07/24 1015 02/07/24 1515 02/08/24 1009 02/08/24 1257  TRNPT 194* 206* 175* 179*       Chemistry Recent Labs  Lab 02/05/24 1741 02/06/24 0339 02/07/24 1017 02/08/24 1008 02/09/24 0413  NA 138   < > 139 138 136  K 4.7   < > 3.3* 4.1 3.9  CL 101   < > 103 100 98  CO2 27   < > 27 29 33*  GLUCOSE 182*   < > 136* 163*  97  BUN 35*   < > 40* 37* 35*  CREATININE 1.84*   < > 1.84* 1.89* 1.92*  CALCIUM  8.2*   < > 8.6* 8.7* 8.8*  PROT 5.8*  --   --   --   --   ALBUMIN 3.1*  --   --   --   --   AST 15  --   --   --   --   ALT 6  --   --   --   --   ALKPHOS 75  --   --   --   --   BILITOT 0.3  --   --   --   --   GFRNONAA 34*   < > 34* 33* 32*  ANIONGAP 10   < > 9 9 5    < > = values in this interval not displayed.    Lipids No results for input(s): CHOL, TRIG, HDL, LABVLDL, LDLCALC, CHOLHDL in the last 168 hours.  Hematology Recent Labs  Lab 02/07/24 1747 02/08/24 0349 02/09/24 0413  WBC 12.0* 13.1* 10.0  RBC 3.33* 3.34* 3.47*  HGB 10.1* 10.2* 10.5*  HCT 31.3* 31.0* 32.7*  MCV 94.0 92.8 94.2  MCH 30.3 30.5 30.3  MCHC 32.3 32.9 32.1  RDW 14.2 14.1 14.2  PLT 179 166 173   Thyroid  No results for input(s): TSH, FREET4 in the last 168 hours.  BNP Recent Labs  Lab 02/05/24 2122  PROBNP 2,383.0*    DDimer No  results for input(s): DDIMER in the last 168 hours.   Radiology  No results found.  Cardiac Studies  2D echo 05/14/2023: 1. Left ventricular ejection fraction, by estimation, is 55 to 60%. The  left ventricle has normal function. The left ventricle has no regional  wall motion abnormalities. There is mild left ventricular hypertrophy.  Left ventricular diastolic parameters  are indeterminate.   2. Right ventricular systolic function is normal. The right ventricular  size is normal.   3. The mitral valve is normal in structure. Mild mitral valve  regurgitation. No evidence of mitral stenosis.   4. The aortic valve is normal in structure. There is mild calcification  of the aortic valve. Aortic valve regurgitation is not visualized. Aortic  valve sclerosis is present, with no evidence of aortic valve stenosis.   5. The inferior vena cava is normal in size with greater than 50%  respiratory variability, suggesting right atrial pressure of 3 mmHg.   Patient Profile   89 y.o. male with a hx of agent orange exposure, CAD status post remote CABG in 1998, HFpEF, chronic hypoxic respiratory failure on supplemental oxygen  via nasal cannula at 2 L, COPD, PAD status post AKA, DM2, CKD stage III, HTN, HLD, PTSD, carotid artery disease, and normocytic anemia who is being seen for the ongoing management of chest pain.   Assessment & Plan   Coronary artery disease Precordial pain - S/p CABG, details are unclear - Initially presenting 12/12 for shortness of breath. Developed sharp chest pain while admitted on 12/14 with EKG not meeting STEMI criteria.  - Troponin 194 > 206 - No further chest pain - Echo ordered  - Continue metoprolol , aspirin , clopidogrel , and rosuvastatin  - No immediate plan for ischemic evaluation  Acute on chronic hypoxic and hypercarbic respiratory failure Sepsis secondary to pneumonia Acute on chronic HFpEF - Initially requiring BiPAP, now weaned to 2 L supplemental  oxygen  via nasal cannula - Volume status appears to be improving, although remains overloaded -  I/Os poorly recorded. Patient reports good UOP yesterday. Now slightly net negative since admission.  - Kidney function is relatively stable - Continue to monitor kidney function, strict I/Os, and daily weights with ongoing diuresis - Continue Lasix  40 mg twice daily - Echo as above  MAT - In the setting of underlying pulmonary illness, now resolved - Continue telemetry monitoring  Normocytic anemia Thrombocytopenia - No obvious bleeding - Likely in the setting of chronic disease - Maintain hemoglobin greater than 8  CKD III - Kidney function relatively stable - Continue to avoid nephrotoxic agents  PAD s/p AKA - Statin and antiplatelet therapy as above    For questions or updates, please contact [facility] Please consult [email or web address] for contact info under       Signed, Lesley LITTIE Maffucci, PA-C  02/09/2024, 9:27 AM

## 2024-02-09 NOTE — Progress Notes (Signed)
 Provided update to patient's son Dick via telephone.

## 2024-02-09 NOTE — Progress Notes (Signed)
 Progress Note   Patient: [name]. FMW:[id number] DOB: 10-23-1930 DOA: 02/05/2024     4 DOS: the patient was seen and examined on 02/09/2024   Brief hospital course: [name]. is a 89 y.o. Caucasian male with medical history significant for anxiety, depression, coronary artery disease, type 2 diabetes mellitus, hypertension, dyslipidemia and PTSD, who presented to the ER with acute onset of worsening dyspnea since Tuesday with associated congested cough with inability to expectorate as well as wheezing, fever and chills.  He was diagnosed with pneumonia on Wednesday at his SNF and was given p.o. doxycycline     He was in significant respiratory distress upon arrival to ED that he had to be placed on BiPAP. Now on [place] oxygen . Chest x-ray showed stable cardiomegaly and left retrocardiac opacity likely atelectatic with small left pleural effusion.  Troponins were elevated, started on heparin  drip as per pharmacy protocol.  Assessment and Plan: * Acute hypoxic, hypercapnic respiratory failure (HCC) In the setting of pneumonia, CHF exacerbation Currently on 2-3L oxygen , wean supplemental oxygen  as able to.  He is off BiPAP. Continue antibiotic therapy with IV Rocephin  and doxycycline . Continue mucolytic therapy as well as duo nebs q.i.d. and q.4 hours p.r.n. Cultures so far negative.  Sepsis due to pneumonia [facility]) Presented with leukocytosis, tachycardia and tachypnea. Continue Rocephin , doxy.  Pro-Cal 1.29. Staph + blood cultures possibly contamination. Leukocytosis, tachycardia and tachypnea improved. Continue to wean supplemental oxygen .  Acute diastolic congestive heart failure- Elevated troponin- Echocardiogram from March 2025 shows EF 50 to 60%. BNP elevated. Cardiology evaluated the patient, continue IV Lasix  to 40 mg twice daily. Heparin  stopped, elevated troponin likely in the setting of sepsis. Patient denies any chest discomfort.  Troponin trended down.  CKD  stage IIIb: Baseline creatinine around 2. Continue to monitor daily renal function as he will be on diuretics.  Dyslipidemia Continue statin therapy.  Depression Continue Wellbutrin  XL  Essential hypertension Continue metoprolol  therapy.  Type 2 diabetes mellitus with chronic kidney disease, with long-term current use of insulin  (HCC) Continue Lantus  10 units, accu-Cheks, sliding scale insulin  as per floor protocol. Continue Tradjenta . Hypoglycemia protocol.  Obesity Class I: BMI 33.45. Diet, exercise and weight reduction advised     Out of bed to chair. Incentive spirometry. Nursing supportive care. Fall, aspiration precautions. Diet:  Diet Orders (From admission, onward)     Start     Ordered   02/06/24 0817  DIET DYS 3 Room service appropriate? Yes; Fluid consistency: Thin  Diet effective now       Question Answer Comment  Room service appropriate? Yes   Fluid consistency: Thin      02/06/24 0816           DVT prophylaxis:   Level of care: Progressive   Code Status: Limited: Do not attempt resuscitation (DNR) -DNR-LIMITED -Do Not Intubate/DNI   Subjective: Patient is seen and examined today morning. He is lying in bed, feels better. Currently on 2-3 L supplemental oxygen .  Son over phone with him, I updated him.  Physical Exam: Vitals:   02/09/24 0424 02/09/24 0500 02/09/24 0748 02/09/24 1155  BP: 113/64  (!) 129/56 116/69  Pulse: 80  80 88  Resp: 18  19 19   Temp: 98.8 F (37.1 C)  98.1 F (36.7 C) 97.7 F (36.5 C)  TempSrc:   Oral Oral  SpO2: (!) 89%  100% 100%  Weight:  99.7 kg    Height:  General - Elderly ill looking obese Caucasian male, no apparent distress HEENT - PERRLA, EOMI, atraumatic head, non tender sinuses. Lung - Clear, basal rales, no wheezes. Heart - S1, S2 heard, no murmurs, rubs, trace pedal edema. Abdomen - Soft, non tender, obese, bowel sounds good Neuro - Alert, awake and oriented, non focal exam. Skin - Warm and  dry.  Right AKA  Data Reviewed:      Latest Ref Rng & Units 02/09/2024    4:13 AM 02/08/2024    3:49 AM 02/07/2024    5:47 PM  CBC  WBC 4.0 - 10.5 K/uL 10.0  13.1  12.0   Hemoglobin 13.0 - 17.0 g/dL 89.4  89.7  89.8   Hematocrit 39.0 - 52.0 % 32.7  31.0  31.3   Platelets 150 - 400 K/uL 173  166  179       Latest Ref Rng & Units 02/09/2024    4:13 AM 02/08/2024   10:08 AM 02/07/2024   10:17 AM  BMP  Glucose 70 - 99 mg/dL 97  836  863   BUN 8 - 23 mg/dL 35  37  40   Creatinine 0.61 - 1.24 mg/dL 8.07  8.10  8.15   Sodium 135 - 145 mmol/L 136  138  139   Potassium 3.5 - 5.1 mmol/L 3.9  4.1  3.3   Chloride 98 - 111 mmol/L 98  100  103   CO2 22 - 32 mmol/L 33  29  27   Calcium  8.9 - 10.3 mg/dL 8.8  8.7  8.6    No results found.  Family Communication: Discussed with patient, son over phone. They understand and agree. All questions answered.  Disposition: Status is: Inpatient Remains inpatient appropriate because: IV Lasix , wean O2.  Planned Discharge Destination: Skilled nursing facility     Time spent: 44 minutes  Author: Concepcion Riser, MD 02/09/2024 3:05 PM Secure chat 7am to 7pm For on call review [email or web address].

## 2024-02-10 DIAGNOSIS — R6520 Severe sepsis without septic shock: Secondary | ICD-10-CM | POA: Diagnosis not present

## 2024-02-10 DIAGNOSIS — J9621 Acute and chronic respiratory failure with hypoxia: Secondary | ICD-10-CM | POA: Diagnosis not present

## 2024-02-10 DIAGNOSIS — J9601 Acute respiratory failure with hypoxia: Secondary | ICD-10-CM | POA: Diagnosis not present

## 2024-02-10 DIAGNOSIS — N1832 Chronic kidney disease, stage 3b: Secondary | ICD-10-CM | POA: Diagnosis not present

## 2024-02-10 DIAGNOSIS — Z794 Long term (current) use of insulin: Secondary | ICD-10-CM | POA: Diagnosis not present

## 2024-02-10 DIAGNOSIS — Z951 Presence of aortocoronary bypass graft: Secondary | ICD-10-CM | POA: Diagnosis not present

## 2024-02-10 DIAGNOSIS — I2489 Other forms of acute ischemic heart disease: Secondary | ICD-10-CM | POA: Diagnosis not present

## 2024-02-10 DIAGNOSIS — J9622 Acute and chronic respiratory failure with hypercapnia: Secondary | ICD-10-CM | POA: Diagnosis not present

## 2024-02-10 DIAGNOSIS — J441 Chronic obstructive pulmonary disease with (acute) exacerbation: Secondary | ICD-10-CM | POA: Diagnosis not present

## 2024-02-10 DIAGNOSIS — J189 Pneumonia, unspecified organism: Secondary | ICD-10-CM | POA: Diagnosis not present

## 2024-02-10 DIAGNOSIS — A419 Sepsis, unspecified organism: Secondary | ICD-10-CM | POA: Diagnosis not present

## 2024-02-10 DIAGNOSIS — E1122 Type 2 diabetes mellitus with diabetic chronic kidney disease: Secondary | ICD-10-CM | POA: Diagnosis not present

## 2024-02-10 DIAGNOSIS — J9602 Acute respiratory failure with hypercapnia: Secondary | ICD-10-CM | POA: Diagnosis not present

## 2024-02-10 LAB — CBC
HCT: 36.8 % — ABNORMAL LOW (ref 39.0–52.0)
Hemoglobin: 11.9 g/dL — ABNORMAL LOW (ref 13.0–17.0)
MCH: 30.5 pg (ref 26.0–34.0)
MCHC: 32.3 g/dL (ref 30.0–36.0)
MCV: 94.4 fL (ref 80.0–100.0)
Platelets: 152 K/uL (ref 150–400)
RBC: 3.9 MIL/uL — ABNORMAL LOW (ref 4.22–5.81)
RDW: 14.2 % (ref 11.5–15.5)
WBC: 11.1 K/uL — ABNORMAL HIGH (ref 4.0–10.5)
nRBC: 0 % (ref 0.0–0.2)

## 2024-02-10 LAB — GLUCOSE, CAPILLARY
Glucose-Capillary: 119 mg/dL — ABNORMAL HIGH (ref 70–99)
Glucose-Capillary: 121 mg/dL — ABNORMAL HIGH (ref 70–99)
Glucose-Capillary: 126 mg/dL — ABNORMAL HIGH (ref 70–99)
Glucose-Capillary: 132 mg/dL — ABNORMAL HIGH (ref 70–99)
Glucose-Capillary: 170 mg/dL — ABNORMAL HIGH (ref 70–99)

## 2024-02-10 LAB — BASIC METABOLIC PANEL WITH GFR
Anion gap: 6 (ref 5–15)
BUN: 34 mg/dL — ABNORMAL HIGH (ref 8–23)
CO2: 35 mmol/L — ABNORMAL HIGH (ref 22–32)
Calcium: 9.2 mg/dL (ref 8.9–10.3)
Chloride: 96 mmol/L — ABNORMAL LOW (ref 98–111)
Creatinine, Ser: 2 mg/dL — ABNORMAL HIGH (ref 0.61–1.24)
GFR, Estimated: 31 mL/min — ABNORMAL LOW (ref >60)
Glucose, Bld: 106 mg/dL — ABNORMAL HIGH (ref 70–99)
Potassium: 3.9 mmol/L (ref 3.5–5.1)
Sodium: 138 mmol/L (ref 135–145)

## 2024-02-10 LAB — CULTURE, BLOOD (ROUTINE X 2)
Culture: NO GROWTH
Special Requests: ADEQUATE

## 2024-02-10 MED ORDER — IPRATROPIUM-ALBUTEROL 0.5-2.5 (3) MG/3ML IN SOLN
3.0000 mL | Freq: Two times a day (BID) | RESPIRATORY_TRACT | Status: DC
  Administered 2024-02-10 – 2024-02-12 (×4): 3 mL via RESPIRATORY_TRACT
  Filled 2024-02-10 (×5): qty 3

## 2024-02-10 MED ORDER — SODIUM CHLORIDE 3 % IN NEBU
4.0000 mL | INHALATION_SOLUTION | Freq: Two times a day (BID) | RESPIRATORY_TRACT | Status: DC
  Administered 2024-02-10 – 2024-02-12 (×3): 4 mL via RESPIRATORY_TRACT
  Filled 2024-02-10 (×5): qty 4

## 2024-02-10 MED ORDER — ENOXAPARIN SODIUM 60 MG/0.6ML IJ SOSY: 0.5000 mg/kg | PREFILLED_SYRINGE | INTRAMUSCULAR | Status: DC

## 2024-02-10 MED ORDER — METOPROLOL SUCCINATE ER 25 MG PO TB24
25.0000 mg | ORAL_TABLET | Freq: Every day | ORAL | Status: DC
  Administered 2024-02-11 – 2024-02-12 (×2): 25 mg via ORAL
  Filled 2024-02-10 (×2): qty 1

## 2024-02-10 MED ORDER — ENOXAPARIN SODIUM 30 MG/0.3ML IJ SOSY
30.0000 mg | PREFILLED_SYRINGE | INTRAMUSCULAR | Status: DC
  Administered 2024-02-10 – 2024-02-11 (×2): 30 mg via SUBCUTANEOUS
  Filled 2024-02-10 (×2): qty 0.3

## 2024-02-10 NOTE — Plan of Care (Signed)

## 2024-02-10 NOTE — Progress Notes (Signed)
 Rounding Note   Patient Name: [name]. Date of Encounter: 02/10/2024  [facility] Cardiologist: Davene: Dawna Bruckner  Subjective Resting comfortably in bed, awake, minimal movement Son-in-law at the bedside Coughing with no significant sputum production Denies significant shortness of breath or chest pain No abdominal distention, no lower extremity edema Received IV Lasix  this morning Creatinine trending up slightly  Scheduled Meds:  aspirin   81 mg Oral Daily   budesonide -glycopyrrolate -formoterol   2 puff Inhalation BID   buPROPion   75 mg Oral BID   calcium -vitamin D   1 tablet Oral BID   clopidogrel   75 mg Oral Daily   docusate  100 mg Oral BID   doxycycline   100 mg Oral Q12H   famotidine   10.4 mg Oral Daily   fluticasone   2 spray Each Nare Daily   Gerhardt's butt cream   Topical BID   guaiFENesin   10 mL Oral QID   insulin  aspart  0-15 Units Subcutaneous TID WC   insulin  aspart  0-5 Units Subcutaneous QHS   insulin  glargine  10 Units Subcutaneous QHS   ipratropium-albuterol   3 mL Nebulization BID   lactulose   20 g Oral Q M,W,F   linagliptin   5 mg Oral Daily   metoprolol  succinate  12.5 mg Oral Daily   polyethylene glycol  17 g Oral Daily   rosuvastatin   10 mg Oral QHS   Continuous Infusions:  PRN Meds: acetaminophen  **OR** acetaminophen , albuterol , chlorpheniramine-HYDROcodone , diclofenac  Sodium, nitroGLYCERIN , ondansetron  **OR** ondansetron  (ZOFRAN ) IV, senna-docusate, traZODone    Vital Signs  Vitals:   02/10/24 0408 02/10/24 0500 02/10/24 0736 02/10/24 0814  BP: (!) 120/57  (!) 116/54   Pulse: 85  88   Resp: 17  (!) 24   Temp: 98.3 F (36.8 C)  98.1 F (36.7 C)   TempSrc:   Oral   SpO2: 100%  100% 97%  Weight:  99.8 kg    Height:        Intake/Output Summary (Last 24 hours) at 02/10/2024 1149 Last data filed at 02/10/2024 0500 Gross per 24 hour  Intake 280 ml  Output 500 ml  Net -220 ml      02/10/2024    5:00 AM  02/09/2024    5:00 AM 02/05/2024    8:39 PM  Last 3 Weights  Weight (lbs) 220 lb 0.3 oz 219 lb 12.8 oz 220 lb  Weight (kg) 99.8 kg 99.7 kg 99.791 kg      Telemetry Normal sinus rhythm- Personally Reviewed  ECG   - Personally Reviewed  Physical Exam  GEN: No acute distress.   Neck: No JVD Cardiac: RRR, no murmurs, rubs, or gallops.  Respiratory: Coarse breath sounds GI: Soft, nontender, non-distended  MS: No edema; No deformity.  Full exam not performed Neuro:  Nonfocal  Psych: Mildly lethargic  Labs High Sensitivity Troponin:  No results for input(s): TROPONINIHS in the last 720 hours.  Recent Labs  Lab 02/07/24 1015 02/07/24 1515 02/08/24 1009 02/08/24 1257  TRNPT 194* 206* 175* 179*       Chemistry Recent Labs  Lab 02/05/24 1741 02/06/24 0339 02/08/24 1008 02/09/24 0413 02/10/24 0359  NA 138   < > 138 136 138  K 4.7   < > 4.1 3.9 3.9  CL 101   < > 100 98 96*  CO2 27   < > 29 33* 35*  GLUCOSE 182*   < > 163* 97 106*  BUN 35*   < > 37* 35* 34*  CREATININE 1.84*   < >  1.89* 1.92* 2.00*  CALCIUM  8.2*   < > 8.7* 8.8* 9.2  PROT 5.8*  --   --   --   --   ALBUMIN 3.1*  --   --   --   --   AST 15  --   --   --   --   ALT 6  --   --   --   --   ALKPHOS 75  --   --   --   --   BILITOT 0.3  --   --   --   --   GFRNONAA 34*   < > 33* 32* 31*  ANIONGAP 10   < > 9 5 6    < > = values in this interval not displayed.    Lipids No results for input(s): CHOL, TRIG, HDL, LABVLDL, LDLCALC, CHOLHDL in the last 168 hours.  Hematology Recent Labs  Lab 02/08/24 0349 02/09/24 0413 02/10/24 0359  WBC 13.1* 10.0 11.1*  RBC 3.34* 3.47* 3.90*  HGB 10.2* 10.5* 11.9*  HCT 31.0* 32.7* 36.8*  MCV 92.8 94.2 94.4  MCH 30.5 30.3 30.5  MCHC 32.9 32.1 32.3  RDW 14.1 14.2 14.2  PLT 166 173 152   Thyroid  No results for input(s): TSH, FREET4 in the last 168 hours.  BNP Recent Labs  Lab 02/05/24 2122  PROBNP 2,383.0*    DDimer No results for input(s):  DDIMER in the last 168 hours.   Radiology  ECHOCARDIOGRAM COMPLETE Result Date: 02/09/2024    ECHOCARDIOGRAM REPORT   Patient Name:   [name]. Date of Exam: 02/09/2024 Medical Rec #:  [id number]           Height:       68.0 in Accession #:    [id number]          Weight:       219.8 lb Date of Birth:  Feb 07, 1931           BSA:          2.127 m Patient Age:    89 years            BP:           116/69 mmHg Patient Gender: M                   HR:           90 bpm. Exam Location:  [facility] Procedure: 2D Echo, Cardiac Doppler, Color Doppler and Intracardiac            Opacification Agent (Both Spectral and Color Flow Doppler were            utilized during procedure). Indications:     Chest Pain  History:         Patient has prior history of Echocardiogram examinations, most                  recent 05/14/2023. CHF, CAD, Prior CABG, COPD; Risk                  Factors:Hypertension, Diabetes and Dyslipidemia. CKD.  Sonographer:     Philomena Daring Referring Phys:  [id number] RYAN M DUNN Diagnosing Phys: Lonni Hanson MD  Sonographer Comments: Technically challenging study due to limited acoustic windows, Technically difficult study due to poor echo windows, suboptimal apical window and patient is obese. Image acquisition challenging due to patient body habitus. IMPRESSIONS  1. Left ventricular ejection fraction, by estimation,  is >55%. The left ventricle has normal function. Left ventricular endocardial border not optimally defined to evaluate regional wall motion. Left ventricular diastolic parameters are consistent with Grade I diastolic dysfunction (impaired relaxation).  2. Right ventricular systolic function is mildly reduced. The right ventricular size is mildly enlarged. Tricuspid regurgitation signal is inadequate for assessing PA pressure.  3. The mitral valve was not well visualized. Trivial mitral valve regurgitation.  4. The aortic valve was not well visualized. Aortic valve regurgitation is trivial.  Aortic valve gradient could not be assessed due to poor apical windows.  5. There is borderline dilatation of the ascending aorta, measuring 35 mm. FINDINGS  Left Ventricle: Left ventricular ejection fraction, by estimation, is >55%. The left ventricle has normal function. Left ventricular endocardial border not optimally defined to evaluate regional wall motion. Definity  contrast agent was given IV to delineate the left ventricular endocardial borders. The left ventricular internal cavity size was normal in size. There is borderline left ventricular hypertrophy. Left ventricular diastolic parameters are consistent with Grade I diastolic dysfunction (impaired relaxation). Right Ventricle: The right ventricular size is mildly enlarged. No increase in right ventricular wall thickness. Right ventricular systolic function is mildly reduced. Tricuspid regurgitation signal is inadequate for assessing PA pressure. Left Atrium: Left atrial size was not well visualized. Right Atrium: Right atrial size was not well visualized. Pericardium: The pericardium was not well visualized. Presence of epicardial fat layer. Mitral Valve: The mitral valve was not well visualized. Mild mitral annular calcification. Trivial mitral valve regurgitation. Tricuspid Valve: The tricuspid valve is not well visualized. Tricuspid valve regurgitation is not demonstrated. Aortic Valve: The aortic valve was not well visualized. Aortic valve regurgitation is trivial. Aortic valve gradient could not be assessed due to poor apical windows. Pulmonic Valve: The pulmonic valve was not well visualized. Pulmonic valve regurgitation is not visualized. No evidence of pulmonic stenosis. Aorta: The aortic root is normal in size and structure. There is borderline dilatation of the ascending aorta, measuring 35 mm. Pulmonary Artery: The pulmonary artery is not well seen. Venous: The inferior vena cava was not well visualized. IAS/Shunts: The interatrial septum was  not well visualized.  LEFT VENTRICLE PLAX 2D LVIDd:         4.10 cm   Diastology LVIDs:         2.60 cm   LV e' medial:    6.74 cm/s LV PW:         1.10 cm   LV E/e' medial:  6.7 LV IVS:        0.80 cm   LV e' lateral:   6.85 cm/s LVOT diam:     2.10 cm   LV E/e' lateral: 6.6 LVOT Area:     3.46 cm  RIGHT VENTRICLE RV S prime:     13.30 cm/s TAPSE (M-mode): 1.2 cm  AORTA Ao Root diam: 2.50 cm MITRAL VALVE MV Area (PHT): 4.41 cm    SHUNTS MV Decel Time: 172 msec    Systemic Diam: 2.10 cm MV E velocity: 44.90 cm/s MV A velocity: 77.20 cm/s MV E/A ratio:  0.58 Christopher End MD Electronically signed by Lonni Hanson MD Signature Date/Time: 02/09/2024/5:44:46 PM    Final     Cardiac Studies   Patient Profile   89 y.o. male with a hx of agent orange exposure, CAD status post remote CABG in 1998, HFpEF, chronic hypoxic respiratory failure on supplemental oxygen  via nasal cannula at 2 L, COPD, PAD status post AKA, DM2, CKD  stage III, HTN, HLD, PTSD, carotid artery disease, and normocytic anemia who is being seen for the ongoing management of shortness of breath, pneumonia  Assessment & Plan  Acute on chronic hypoxic and hypercarbic respiratory failure Sepsis secondary to pneumonia Acute on chronic HFpEF -Echo with EF greater than 55%, unable to estimate right heart pressures - Initially requiring BiPAP, weaned to nasal cannula oxygen  -Has received several doses IV Lasix  -Appears grossly euvolemic -Will transition to oral Lasix  daily  Coronary artery disease -Demand ischemia Presenting 12/12 with shortness of breath.  Episode of sharp chest pain while admitted on 12/14 with EKG not meeting STEMI criteria.  - Troponin 194 > 206, likely supply/demand mismatch - No further chest pain -Normal ejection fraction on echocardiogram - Continue metoprolol , aspirin , clopidogrel , and rosuvastatin  - -No plans for ischemic workup at this time -Continue Crestor  10 daily, aspirin , beta-blocker   MAT - In  the setting of underlying pulmonary illness, now resolved - Metoprolol  succinate up to 25 daily   Normocytic anemia Thrombocytopenia  chronic disease, hemoglobin trending higher 11.9   CKD III - Kidney function relatively stable - Creatinine 2.0, change off IV Lasix  to oral diuretic   PAD s/p AKA - Statin and antiplatelet therapy as above      For questions or updates, please contact [facility] Please consult [email or web address] for contact info under     Signed, Gabbriella Presswood, MD  02/10/2024, 11:49 AM

## 2024-02-10 NOTE — TOC Initial Note (Signed)
 Transition of Care (TOC) - Initial/Assessment Note    Patient Details  Name: [name]. MRN: [id number] Date of Birth: Mar 12, 1930  Transition of Care [facility]) CM/SW Contact:    Shasta DELENA Daring, RN Phone Number: 02/10/2024, 3:58 PM  Clinical Narrative:                  RNCM met with patient's sons at bedside. Introduced self and role. Patient will return to LTC at [facility] at discharge. [facility] transport needed because patient is non-ambulatory.   Son's expressed concern that patient might be discharged tomorrow. Said [facility] is unable to use a Purewick so patient is not kept as dry. They are concerned his wound will not be able to heal. Advised I would share their concerns with the MD following the case.  Reviewed how transport takes place and answered all questions. Encouraged them to ask the nurse to contact CM if more questions arise.       Patient Goals and CMS Choice            Expected Discharge Plan and Services                                              Prior Living Arrangements/Services                       Activities of Daily Living   ADL Screening (condition at time of admission) Independently performs ADLs?: No Does the patient have a NEW difficulty with bathing/dressing/toileting/self-feeding that is expected to last >3 days?: No Does the patient have a NEW difficulty with getting in/out of bed, walking, or climbing stairs that is expected to last >3 days?: No Does the patient have a NEW difficulty with communication that is expected to last >3 days?: No Is the patient deaf or have difficulty hearing?: Yes Does the patient have difficulty seeing, even when wearing glasses/contacts?: Yes Does the patient have difficulty concentrating, remembering, or making decisions?: No  Permission Sought/Granted                  Emotional Assessment              Admission diagnosis:  Acute respiratory failure (HCC)  [J96.00] COPD exacerbation (HCC) [J44.1] Pneumonia due to infectious organism, unspecified laterality, unspecified part of lung [J18.9] Sepsis with acute hypercapnic respiratory failure without septic shock, due to unspecified organism (HCC) [A41.9, R65.20, J96.02] Patient Active Problem List   Diagnosis Date Noted   Demand ischemia (HCC) 02/09/2024   Chest pain due to myocardial ischemia 02/07/2024   Hx of CABG 02/07/2024   Acute on chronic respiratory failure with hypoxia and hypercapnia (HCC) 02/07/2024   Depression 02/06/2024   Dyslipidemia 02/06/2024   Acute diastolic CHF (congestive heart failure) (HCC) 02/06/2024   Pneumonia due to infectious organism 02/06/2024   Acute respiratory failure (HCC) 02/05/2024   Obesity 05/14/2023   Pressure injury of skin 05/14/2023   Acute proctitis 05/13/2023   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/13/2023   Acute renal failure superimposed on stage 3b chronic kidney disease (HCC) 05/13/2023   Elevated troponin 05/13/2023   Abdominal pain 05/13/2023   Hypocalcemia 05/13/2023   COPD (chronic obstructive pulmonary disease) (HCC) 02/18/2022   COPD exacerbation (HCC) 02/17/2022   Influenza A 02/17/2022   Bilateral pneumonia 03/03/2020   Stage 3b chronic kidney  disease (CKD) (HCC) 03/02/2020   Acute pyelonephritis 03/02/2020   PAD S/P AKA (above knee amputation), right (HCC) 03/02/2020   Sepsis with acute hypercapnic respiratory failure without septic shock (HCC) 03/02/2020   Pyelonephritis 03/02/2020   PVD (peripheral vascular disease) 03/25/2019   Osteomyelitis of right foot (HCC) 03/25/2019   Frequency of urination and polyuria 03/25/2019   Atherosclerosis of native arteries of the extremities with ulceration (HCC) 03/14/2019   COVID-19 virus infection 03/14/2019   Sacral pressure sore 11/25/2018   Arterial hemorrhage 11/18/2018   Hypoglycemia associated with type 2 diabetes mellitus (HCC) 11/18/2018   General weakness 09/22/2016   Bilateral carpal  tunnel syndrome 09/22/2016   Leukocytosis 07/30/2015   Chronic heart failure with preserved ejection fraction (HFpEF) (HCC) 09/22/2013   Urinary incontinence 12/13/2012   Chronic idiopathic constipation 11/29/2012   Metabolic syndrome 11/29/2012   Spina bifida (HCC) 11/18/2012   CAD s/p CABG (coronary artery disease) 03/08/2009   Type 2 diabetes mellitus with diabetic nephropathy (HCC) 10/29/2008   Type 2 diabetes mellitus with chronic kidney disease, with long-term current use of insulin  (HCC) 10/29/2008   PTSD 10/29/2008   Essential hypertension 10/29/2008   PCP:  [facility]:   [facility] DELIVERY - [place], [place] - [address] [place] [address] Phone: [phone] Fax: [phone]  [facility] [place], [place] - [address] [place] [address] Phone: [phone] Fax: [phone]     Social Drivers of Health (SDOH) Social History: SDOH Screenings   Food Insecurity: Unknown (02/06/2024)  Housing: Patient Declined (02/06/2024)  Transportation Needs: Patient Declined (02/06/2024)  Utilities: Patient Declined (02/06/2024)  Social Connections: Socially Isolated (02/06/2024)  Tobacco Use: Medium Risk (02/06/2024)   SDOH Interventions:     Readmission Risk Interventions    02/10/2024    3:58 PM 05/14/2023   12:33 PM  Readmission Risk Prevention Plan  Transportation Screening Complete Complete  PCP or Specialist Appt within 3-5 Days Complete Complete  HRI or Home Care Consult Complete   Social Work Consult for Recovery Care Planning/Counseling Complete Complete  Palliative Care Screening Not Applicable Not Applicable  Medication Review Oceanographer) Complete Complete

## 2024-02-10 NOTE — Progress Notes (Signed)
 PROGRESS NOTE    [name].  FMW:[id number] DOB: Jan 08, 1931 DOA: 02/05/2024 PCP: [facility]  [id number]/[id number]-AA  LOS: 5 days   Brief hospital course:   Assessment & Plan: Dearl Rudden. is a 89 y.o. Caucasian male with medical history significant for anxiety, depression, coronary artery disease, type 2 diabetes mellitus, hypertension, dyslipidemia and PTSD, who presented to the ER with acute onset of worsening dyspnea since Tuesday with associated congested cough with inability to expectorate as well as wheezing, fever and chills.  He was diagnosed with pneumonia on Wednesday at his SNF and was given p.o. doxycycline     He was in significant respiratory distress upon arrival to ED that he had to be placed on BiPAP. Now on [place] oxygen . Chest x-ray showed stable cardiomegaly and left retrocardiac opacity likely atelectatic with small left pleural effusion.  Troponins were elevated, started on heparin  drip as per pharmacy protocol.   * Acute on chronic hypoxic respiratory failure (HCC) 2L O2 at baseline In the setting of pneumonia, CHF exacerbation.  Off BiPAP and currently on 1L . --Continue supplemental O2 to keep sats >=90%  Sepsis due to pneumonia [facility]) Presented with leukocytosis, tachycardia and tachypnea. --completed 5 days of ceftriaxone  and doxy --start hypertonic saline neb BID with DuoNeb   Acute diastolic congestive heart failure- Echocardiogram from March 2025 shows EF 50 to 60%. BNP elevated. Cardiology consulted.   --received IV Lasix  to 40 mg twice daily.  --diuretics per cardio  Troponin elevation 2/2 demand ischemia Patient denies any chest discomfort.  Heparin  gtt stopped.   CKD stage IIIb: Baseline creatinine around 2. --monitor Cr while diuresing   Dyslipidemia Continue statin therapy.   Depression Continue Wellbutrin  XL   Essential hypertension --cont Toprol    Type 2 diabetes mellitus with chronic kidney disease, with  long-term current use of insulin  (HCC) Continue Lantus  10 units, accu-Cheks, sliding scale insulin  as per floor protocol. Continue Tradjenta . Hypoglycemia protocol.   Obesity Class I: BMI 33.45. Diet, exercise and weight reduction advised  Pressure Injury Ischial tuberosity Right Stage 2  Pressure Injury Ischial tuberosity Left;Posterior;Proximal Stage 3  Pressure Injury Buttocks Right;Left Stage 3  --wound care per order   DVT prophylaxis: Lovenox  SQ Code Status: DNR  Family Communication: sons updated at bedside today Level of care: Med-Surg Dispo:   The patient is from: SNF LTC Anticipated d/c is to: SNF LTC Anticipated d/c date is: Friday   Subjective and Interval History:  Sons reported pt had congestion and phlegm.  Good urine output.   Objective: Vitals:   02/10/24 1201 02/10/24 1606 02/10/24 1933 02/10/24 2033  BP: 126/61 118/68  (!) 122/59  Pulse: 80 84  89  Resp: 20 16  19   Temp: 97.9 F (36.6 C)   98 F (36.7 C)  TempSrc:    Oral  SpO2: 100% 100% 98% 98%  Weight:      Height:        Intake/Output Summary (Last 24 hours) at 02/10/2024 2113 Last data filed at 02/10/2024 1840 Gross per 24 hour  Intake 240 ml  Output 500 ml  Net -260 ml   Filed Weights   02/05/24 2039 02/09/24 0500 02/10/24 0500  Weight: 99.8 kg 99.7 kg 99.8 kg    Examination:   Constitutional: NAD, AAOx3 HEENT: conjunctivae and lids normal, EOMI CV: No cyanosis.   RESP: normal respiratory effort, on 1L Extremities: No effusions, edema in LLE SKIN: warm, dry   Data Reviewed: I have  personally reviewed labs and imaging studies  Time spent: 50 minutes  Ellouise Haber, MD [facility] If 7PM-7AM, please contact night-coverage 02/10/2024, 9:13 PM

## 2024-02-11 ENCOUNTER — Other Ambulatory Visit: Payer: Self-pay

## 2024-02-11 DIAGNOSIS — N1832 Chronic kidney disease, stage 3b: Secondary | ICD-10-CM | POA: Diagnosis not present

## 2024-02-11 DIAGNOSIS — J441 Chronic obstructive pulmonary disease with (acute) exacerbation: Secondary | ICD-10-CM | POA: Diagnosis not present

## 2024-02-11 DIAGNOSIS — J189 Pneumonia, unspecified organism: Secondary | ICD-10-CM | POA: Diagnosis not present

## 2024-02-11 DIAGNOSIS — J9601 Acute respiratory failure with hypoxia: Secondary | ICD-10-CM | POA: Diagnosis not present

## 2024-02-11 DIAGNOSIS — J9621 Acute and chronic respiratory failure with hypoxia: Secondary | ICD-10-CM | POA: Diagnosis not present

## 2024-02-11 DIAGNOSIS — E1122 Type 2 diabetes mellitus with diabetic chronic kidney disease: Secondary | ICD-10-CM | POA: Diagnosis not present

## 2024-02-11 DIAGNOSIS — Z951 Presence of aortocoronary bypass graft: Secondary | ICD-10-CM | POA: Diagnosis not present

## 2024-02-11 DIAGNOSIS — J9622 Acute and chronic respiratory failure with hypercapnia: Secondary | ICD-10-CM | POA: Diagnosis not present

## 2024-02-11 DIAGNOSIS — I5031 Acute diastolic (congestive) heart failure: Secondary | ICD-10-CM | POA: Diagnosis not present

## 2024-02-11 DIAGNOSIS — E785 Hyperlipidemia, unspecified: Secondary | ICD-10-CM | POA: Diagnosis not present

## 2024-02-11 DIAGNOSIS — R6520 Severe sepsis without septic shock: Secondary | ICD-10-CM | POA: Diagnosis not present

## 2024-02-11 DIAGNOSIS — A419 Sepsis, unspecified organism: Secondary | ICD-10-CM | POA: Diagnosis not present

## 2024-02-11 DIAGNOSIS — I2489 Other forms of acute ischemic heart disease: Secondary | ICD-10-CM | POA: Diagnosis not present

## 2024-02-11 LAB — RENAL FUNCTION PANEL
Albumin: 3.2 g/dL — ABNORMAL LOW (ref 3.5–5.0)
Anion gap: 11 (ref 5–15)
BUN: 33 mg/dL — ABNORMAL HIGH (ref 8–23)
CO2: 34 mmol/L — ABNORMAL HIGH (ref 22–32)
Calcium: 9.5 mg/dL (ref 8.9–10.3)
Chloride: 96 mmol/L — ABNORMAL LOW (ref 98–111)
Creatinine, Ser: 2.11 mg/dL — ABNORMAL HIGH (ref 0.61–1.24)
GFR, Estimated: 29 mL/min — ABNORMAL LOW (ref >60)
Glucose, Bld: 93 mg/dL (ref 70–99)
Phosphorus: 3 mg/dL (ref 2.5–4.6)
Potassium: 4 mmol/L (ref 3.5–5.1)
Sodium: 141 mmol/L (ref 135–145)

## 2024-02-11 LAB — GLUCOSE, CAPILLARY
Glucose-Capillary: 100 mg/dL — ABNORMAL HIGH (ref 70–99)
Glucose-Capillary: 112 mg/dL — ABNORMAL HIGH (ref 70–99)
Glucose-Capillary: 142 mg/dL — ABNORMAL HIGH (ref 70–99)
Glucose-Capillary: 104 mg/dL — ABNORMAL HIGH (ref 70–99)

## 2024-02-11 LAB — MAGNESIUM: Magnesium: 2 mg/dL (ref 1.7–2.4)

## 2024-02-11 MED ORDER — ENSURE PLUS HIGH PROTEIN PO LIQD
237.0000 mL | Freq: Two times a day (BID) | ORAL | Status: DC
  Administered 2024-02-11: 15:00:00 237 mL via ORAL

## 2024-02-11 NOTE — Progress Notes (Signed)
 Rounding Note   Patient Name: [name]. Date of Encounter: 02/11/2024  [facility] Cardiologist: Dawna Bruckner   Subjective Resting comfortably in bed, significant chest congestion, cough, no significant sputum production -Pain left lower quadrant of abdomen - No chest pain   Scheduled Meds:  aspirin   81 mg Oral Daily   budesonide -glycopyrrolate -formoterol   2 puff Inhalation BID   buPROPion   75 mg Oral BID   calcium -vitamin D   1 tablet Oral BID   clopidogrel   75 mg Oral Daily   enoxaparin  (LOVENOX ) injection  30 mg Subcutaneous Q24H   famotidine   10.4 mg Oral Daily   fluticasone   2 spray Each Nare Daily   Gerhardt's butt cream   Topical BID   guaiFENesin   10 mL Oral QID   insulin  aspart  0-15 Units Subcutaneous TID WC   insulin  aspart  0-5 Units Subcutaneous QHS   insulin  glargine  10 Units Subcutaneous QHS   ipratropium-albuterol   3 mL Nebulization BID   lactulose   20 g Oral Q M,W,F   linagliptin   5 mg Oral Daily   metoprolol  succinate  25 mg Oral Daily   rosuvastatin   10 mg Oral QHS   sodium chloride  HYPERTONIC  4 mL Nebulization BID   Continuous Infusions:  PRN Meds: acetaminophen  **OR** acetaminophen , albuterol , chlorpheniramine-HYDROcodone , diclofenac  Sodium, nitroGLYCERIN , ondansetron  **OR** ondansetron  (ZOFRAN ) IV, senna-docusate, traZODone    Vital Signs  Vitals:   02/11/24 0408 02/11/24 0437 02/11/24 0829 02/11/24 0925  BP: 118/70  125/60   Pulse: 83  83   Resp: 18  17   Temp: 97.9 F (36.6 C)  98 F (36.7 C)   TempSrc:      SpO2: 100%  97% 95%  Weight:  100.1 kg    Height:        Intake/Output Summary (Last 24 hours) at 02/11/2024 1056 Last data filed at 02/10/2024 1840 Gross per 24 hour  Intake 240 ml  Output --  Net 240 ml      02/11/2024    4:37 AM 02/10/2024    5:00 AM 02/09/2024    5:00 AM  Last 3 Weights  Weight (lbs) 220 lb 10.9 oz 220 lb 0.3 oz 219 lb 12.8 oz  Weight (kg) 100.1 kg 99.8 kg 99.7 kg       Telemetry Normal sinus rhythm- Personally Reviewed  ECG   - Personally Reviewed  Physical Exam  GEN: No acute distress.   Neck: No JVD Cardiac: RRR, no murmurs, rubs, or gallops.  Respiratory: Clear to auscultation bilaterally. GI: Soft, nontender, non-distended  MS: No edema; No deformity.  Amputation Neuro:  Nonfocal  Psych: Normal affect   Labs High Sensitivity Troponin:  No results for input(s): TROPONINIHS in the last 720 hours.  Recent Labs  Lab 02/07/24 1015 02/07/24 1515 02/08/24 1009 02/08/24 1257  TRNPT 194* 206* 175* 179*       Chemistry Recent Labs  Lab 02/05/24 1741 02/06/24 0339 02/09/24 0413 02/10/24 0359 02/11/24 0612  NA 138   < > 136 138 141  K 4.7   < > 3.9 3.9 4.0  CL 101   < > 98 96* 96*  CO2 27   < > 33* 35* 34*  GLUCOSE 182*   < > 97 106* 93  BUN 35*   < > 35* 34* 33*  CREATININE 1.84*   < > 1.92* 2.00* 2.11*  CALCIUM  8.2*   < > 8.8* 9.2 9.5  MG  --   --   --   --  2.0  PROT 5.8*  --   --   --   --   ALBUMIN 3.1*  --   --   --  3.2*  AST 15  --   --   --   --   ALT 6  --   --   --   --   ALKPHOS 75  --   --   --   --   BILITOT 0.3  --   --   --   --   GFRNONAA 34*   < > 32* 31* 29*  ANIONGAP 10   < > 5 6 11    < > = values in this interval not displayed.    Lipids No results for input(s): CHOL, TRIG, HDL, LABVLDL, LDLCALC, CHOLHDL in the last 168 hours.  Hematology Recent Labs  Lab 02/08/24 0349 02/09/24 0413 02/10/24 0359  WBC 13.1* 10.0 11.1*  RBC 3.34* 3.47* 3.90*  HGB 10.2* 10.5* 11.9*  HCT 31.0* 32.7* 36.8*  MCV 92.8 94.2 94.4  MCH 30.5 30.3 30.5  MCHC 32.9 32.1 32.3  RDW 14.1 14.2 14.2  PLT 166 173 152   Thyroid  No results for input(s): TSH, FREET4 in the last 168 hours.  BNP Recent Labs  Lab 02/05/24 2122  PROBNP 2,383.0*    DDimer No results for input(s): DDIMER in the last 168 hours.   Radiology  ECHOCARDIOGRAM COMPLETE Result Date: 02/09/2024    ECHOCARDIOGRAM REPORT   Patient  Name:   [name]. Date of Exam: 02/09/2024 Medical Rec #:  [id number]           Height:       68.0 in Accession #:    [id number]          Weight:       219.8 lb Date of Birth:  October 08, 1930           BSA:          2.127 m Patient Age:    89 years            BP:           116/69 mmHg Patient Gender: M                   HR:           90 bpm. Exam Location:  [facility] Procedure: 2D Echo, Cardiac Doppler, Color Doppler and Intracardiac            Opacification Agent (Both Spectral and Color Flow Doppler were            utilized during procedure). Indications:     Chest Pain  History:         Patient has prior history of Echocardiogram examinations, most                  recent 05/14/2023. CHF, CAD, Prior CABG, COPD; Risk                  Factors:Hypertension, Diabetes and Dyslipidemia. CKD.  Sonographer:     Philomena Daring Referring Phys:  [id number] RYAN M DUNN Diagnosing Phys: Lonni Hanson MD  Sonographer Comments: Technically challenging study due to limited acoustic windows, Technically difficult study due to poor echo windows, suboptimal apical window and patient is obese. Image acquisition challenging due to patient body habitus. IMPRESSIONS  1. Left ventricular ejection fraction, by estimation, is >55%. The left ventricle has normal function. Left ventricular endocardial border not optimally  defined to evaluate regional wall motion. Left ventricular diastolic parameters are consistent with Grade I diastolic dysfunction (impaired relaxation).  2. Right ventricular systolic function is mildly reduced. The right ventricular size is mildly enlarged. Tricuspid regurgitation signal is inadequate for assessing PA pressure.  3. The mitral valve was not well visualized. Trivial mitral valve regurgitation.  4. The aortic valve was not well visualized. Aortic valve regurgitation is trivial. Aortic valve gradient could not be assessed due to poor apical windows.  5. There is borderline dilatation of the ascending aorta,  measuring 35 mm. FINDINGS  Left Ventricle: Left ventricular ejection fraction, by estimation, is >55%. The left ventricle has normal function. Left ventricular endocardial border not optimally defined to evaluate regional wall motion. Definity  contrast agent was given IV to delineate the left ventricular endocardial borders. The left ventricular internal cavity size was normal in size. There is borderline left ventricular hypertrophy. Left ventricular diastolic parameters are consistent with Grade I diastolic dysfunction (impaired relaxation). Right Ventricle: The right ventricular size is mildly enlarged. No increase in right ventricular wall thickness. Right ventricular systolic function is mildly reduced. Tricuspid regurgitation signal is inadequate for assessing PA pressure. Left Atrium: Left atrial size was not well visualized. Right Atrium: Right atrial size was not well visualized. Pericardium: The pericardium was not well visualized. Presence of epicardial fat layer. Mitral Valve: The mitral valve was not well visualized. Mild mitral annular calcification. Trivial mitral valve regurgitation. Tricuspid Valve: The tricuspid valve is not well visualized. Tricuspid valve regurgitation is not demonstrated. Aortic Valve: The aortic valve was not well visualized. Aortic valve regurgitation is trivial. Aortic valve gradient could not be assessed due to poor apical windows. Pulmonic Valve: The pulmonic valve was not well visualized. Pulmonic valve regurgitation is not visualized. No evidence of pulmonic stenosis. Aorta: The aortic root is normal in size and structure. There is borderline dilatation of the ascending aorta, measuring 35 mm. Pulmonary Artery: The pulmonary artery is not well seen. Venous: The inferior vena cava was not well visualized. IAS/Shunts: The interatrial septum was not well visualized.  LEFT VENTRICLE PLAX 2D LVIDd:         4.10 cm   Diastology LVIDs:         2.60 cm   LV e' medial:    6.74  cm/s LV PW:         1.10 cm   LV E/e' medial:  6.7 LV IVS:        0.80 cm   LV e' lateral:   6.85 cm/s LVOT diam:     2.10 cm   LV E/e' lateral: 6.6 LVOT Area:     3.46 cm  RIGHT VENTRICLE RV S prime:     13.30 cm/s TAPSE (M-mode): 1.2 cm  AORTA Ao Root diam: 2.50 cm MITRAL VALVE MV Area (PHT): 4.41 cm    SHUNTS MV Decel Time: 172 msec    Systemic Diam: 2.10 cm MV E velocity: 44.90 cm/s MV A velocity: 77.20 cm/s MV E/A ratio:  0.58 Christopher End MD Electronically signed by Lonni Hanson MD Signature Date/Time: 02/09/2024/5:44:46 PM    Final     Cardiac Studies   Patient Profile   89 y.o. male with a hx of agent orange exposure, CAD status post remote CABG in 1998, HFpEF, chronic hypoxic respiratory failure on supplemental oxygen  via nasal cannula at 2 L, COPD, PAD status post AKA, DM2, CKD stage III, HTN, HLD, PTSD, carotid artery disease, and normocytic anemia who is being  seen for the ongoing management of shortness of breath, pneumonia   Assessment & Plan  Acute on chronic hypoxic and hypercarbic respiratory failure Sepsis secondary to pneumonia Acute on chronic HFpEF -Echo with EF greater than 55% -On arrival requiring BiPAP, has been weaned to nasal cannula oxygen , now on room air -Treated with several doses IV Lasix , - Slight trend up in BUN and creatinine, appears that his baseline - Consider initiating Lasix  20 daily at discharge  Coronary artery disease -Demand ischemia Presenting 12/12 with shortness of breath.  Episode of sharp chest pain while admitted on 12/14 with EKG not meeting STEMI criteria.  Normal ejection fraction on echo - Troponin 194 > 206, likely supply/demand mismatch Denies chest pain - Continue metoprolol , aspirin , clopidogrel , and rosuvastatin  - Not a good candidate for ischemic workup  -Continue Crestor  10 daily, aspirin , beta-blocker   MAT - In the setting of underlying pulmonary illness, Sinus rhythm on telemetry -Continue metoprolol  succinate 25  daily   Normocytic anemia Thrombocytopenia  chronic disease, hemoglobin trending upwards, stable   CKD III - Kidney function relatively stable - Creatinine 2.0,  Off IV Lasix , consider transition to Lasix  po 20 daily   PAD s/p AKA - Statin and antiplatelet therapy as above   [facility] will sign off.   Medication Recommendations: No further medication changes Other recommendations (labs, testing, etc): No further testing at this time Follow up as an outpatient: Followed by Dawna Bruckner  For questions or updates, please contact [facility] Please consult [email or web address] for contact info under       Signed, Elfreda Blanchet, MD  02/11/2024, 10:56 AM

## 2024-02-11 NOTE — Progress Notes (Signed)
 PROGRESS NOTE    [name].  FMW:[id number] DOB: [birth year] DOA: 02/05/2024 PCP: [facility]  [id number]  LOS: 6 days   Brief hospital course:   Assessment & Plan: Bobby Tschantz. is a 89 y.o. Caucasian male with medical history significant for anxiety, depression, coronary artery disease, type 2 diabetes mellitus, hypertension, dyslipidemia and PTSD, who presented to the ER with acute onset of worsening dyspnea since Tuesday with associated congested cough with inability to expectorate as well as wheezing, fever and chills.  He was diagnosed with pneumonia on Wednesday at his SNF and was given p.o. doxycycline     He was in significant respiratory distress upon arrival to ED that he had to be placed on BiPAP. Now on [place] oxygen . Chest x-ray showed stable cardiomegaly and left retrocardiac opacity likely atelectatic with small left pleural effusion.  Troponins were elevated, started on heparin  drip as per pharmacy protocol.   * Acute on chronic hypoxic respiratory failure (HCC) 2L O2 at baseline In the setting of pneumonia, CHF exacerbation.  Off BiPAP and currently on [place]. --Continue supplemental O2 to keep sats >=90%  Sepsis due to pneumonia [facility]) Presented with leukocytosis, tachycardia and tachypnea. --completed 5 days of ceftriaxone  and doxy --cont hypertonic saline neb BID with DuoNeb   Acute diastolic congestive heart failure- Echocardiogram from March 2025 shows EF 50 to 60%. BNP elevated. Cardiology consulted.   --received IV Lasix  to 40 mg twice daily.  --hold diuretic today --diuretics per cardio  Troponin elevation 2/2 demand ischemia Patient denies any chest discomfort.  Heparin  gtt stopped.   CKD stage IIIb: Baseline creatinine around 2. --monitor Cr while diuresing   Dyslipidemia Continue statin therapy.   Depression Continue Wellbutrin  XL   Essential hypertension --cont Toprol    Type 2 diabetes mellitus with chronic kidney  disease, with long-term current use of insulin  (HCC) --cont Lantus  10u nightly --ACHS and SSI   Obesity Class I: BMI 33.45.  Pressure Injury Ischial tuberosity Right Stage 2  Pressure Injury Ischial tuberosity Left;Posterior;Proximal Stage 3  Pressure Injury Buttocks Right;Left Stage 3  --wound care per order   DVT prophylaxis: Lovenox  SQ Code Status: DNR  Family Communication: son updated at bedside today Level of care: Med-Surg Dispo:   The patient is from: SNF LTC Anticipated d/c is to: SNF LTC Anticipated d/c date is: Friday   Subjective and Interval History:  Pt was able to bring up more sputum.   Objective: Vitals:   02/11/24 0437 02/11/24 0829 02/11/24 0925 02/11/24 1646  BP:  125/60  (!) 126/59  Pulse:  83  85  Resp:  17  18  Temp:  98 F (36.7 C)  98.3 F (36.8 C)  TempSrc:      SpO2:  97% 95% 94%  Weight: 100.1 kg     Height:        Intake/Output Summary (Last 24 hours) at 02/11/2024 1843 Last data filed at 02/11/2024 1405 Gross per 24 hour  Intake 30 ml  Output --  Net 30 ml   Filed Weights   02/09/24 0500 02/10/24 0500 02/11/24 0437  Weight: 99.7 kg 99.8 kg 100.1 kg    Examination:   Constitutional: NAD, alert, oriented HEENT: conjunctivae and lids normal, EOMI CV: No cyanosis.   RESP: normal respiratory effort, on RA Neuro: II - XII grossly intact.     Data Reviewed: I have personally reviewed labs and imaging studies  Time spent: 35 minutes  Ellouise Haber, MD [facility] If 7PM-7AM, please contact night-coverage 02/11/2024, 6:43 PM

## 2024-02-11 NOTE — Plan of Care (Signed)

## 2024-02-12 DIAGNOSIS — J9601 Acute respiratory failure with hypoxia: Secondary | ICD-10-CM | POA: Diagnosis not present

## 2024-02-12 LAB — GLUCOSE, CAPILLARY
Glucose-Capillary: 100 mg/dL — ABNORMAL HIGH (ref 70–99)
Glucose-Capillary: 218 mg/dL — ABNORMAL HIGH (ref 70–99)

## 2024-02-12 LAB — CREATININE, SERUM
Creatinine, Ser: 2.15 mg/dL — ABNORMAL HIGH (ref 0.61–1.24)
GFR, Estimated: 28 mL/min — ABNORMAL LOW

## 2024-02-12 MED ORDER — METOPROLOL SUCCINATE ER 25 MG PO TB24: 25.0000 mg | ORAL_TABLET | Freq: Every day | ORAL | Status: AC

## 2024-02-12 MED ORDER — GERHARDT'S BUTT CREAM: 1.0000 | TOPICAL_CREAM | Freq: Two times a day (BID) | CUTANEOUS | Status: AC

## 2024-02-12 MED ORDER — FUROSEMIDE 20 MG PO TABS: 20.0000 mg | ORAL_TABLET | Freq: Every day | ORAL | Status: AC

## 2024-02-12 NOTE — Plan of Care (Signed)
 " Problem: Fluid Volume: Goal: Hemodynamic stability will improve 02/12/2024 1324 by Vicci Covert, RN Outcome: Adequate for Discharge 02/12/2024 1324 by Vicci Covert, RN Outcome: Progressing   Problem: Clinical Measurements: Goal: Diagnostic test results will improve 02/12/2024 1324 by Vicci Covert, RN Outcome: Adequate for Discharge 02/12/2024 1324 by Vicci Covert, RN Outcome: Progressing Goal: Signs and symptoms of infection will decrease 02/12/2024 1324 by Vicci Covert, RN Outcome: Adequate for Discharge 02/12/2024 1324 by Vicci Covert, RN Outcome: Progressing   Problem: Respiratory: Goal: Ability to maintain adequate ventilation will improve 02/12/2024 1324 by Vicci Covert, RN Outcome: Adequate for Discharge 02/12/2024 1324 by Vicci Covert, RN Outcome: Progressing   Problem: Education: Goal: Ability to describe self-care measures that may prevent or decrease complications (Diabetes Survival Skills Education) will improve 02/12/2024 1324 by Vicci Covert, RN Outcome: Adequate for Discharge 02/12/2024 1324 by Vicci Covert, RN Outcome: Progressing Goal: Individualized Educational Video(s) 02/12/2024 1324 by Lacy Taglieri, RN Outcome: Adequate for Discharge 02/12/2024 1324 by Vicci Covert, RN Outcome: Progressing   Problem: Coping: Goal: Ability to adjust to condition or change in health will improve 02/12/2024 1324 by Vicci Covert, RN Outcome: Adequate for Discharge 02/12/2024 1324 by Vicci Covert, RN Outcome: Progressing   Problem: Fluid Volume: Goal: Ability to maintain a balanced intake and output will improve 02/12/2024 1324 by Vicci Covert, RN Outcome: Adequate for Discharge 02/12/2024 1324 by Vicci Covert, RN Outcome: Progressing   Problem: Health Behavior/Discharge Planning: Goal: Ability to identify and utilize available resources and services will improve 02/12/2024 1324 by Vicci Covert, RN Outcome:  Adequate for Discharge 02/12/2024 1324 by Vicci Covert, RN Outcome: Progressing Goal: Ability to manage health-related needs will improve 02/12/2024 1324 by Vicci Covert, RN Outcome: Adequate for Discharge 02/12/2024 1324 by Vicci Covert, RN Outcome: Progressing   Problem: Metabolic: Goal: Ability to maintain appropriate glucose levels will improve 02/12/2024 1324 by Vicci Covert, RN Outcome: Adequate for Discharge 02/12/2024 1324 by Vicci Covert, RN Outcome: Progressing   Problem: Nutritional: Goal: Maintenance of adequate nutrition will improve 02/12/2024 1324 by Vicci Covert, RN Outcome: Adequate for Discharge 02/12/2024 1324 by Vicci Covert, RN Outcome: Progressing Goal: Progress toward achieving an optimal weight will improve 02/12/2024 1324 by Vicci Covert, RN Outcome: Adequate for Discharge 02/12/2024 1324 by Vicci Covert, RN Outcome: Progressing   Problem: Skin Integrity: Goal: Risk for impaired skin integrity will decrease 02/12/2024 1324 by Vicci Covert, RN Outcome: Adequate for Discharge 02/12/2024 1324 by Vicci Covert, RN Outcome: Progressing   Problem: Tissue Perfusion: Goal: Adequacy of tissue perfusion will improve 02/12/2024 1324 by Vicci Covert, RN Outcome: Adequate for Discharge 02/12/2024 1324 by Vicci Covert, RN Outcome: Progressing   Problem: Education: Goal: Knowledge of General Education information will improve Description: Including pain rating scale, medication(s)/side effects and non-pharmacologic comfort measures 02/12/2024 1324 by Angelena Sand, RN Outcome: Adequate for Discharge 02/12/2024 1324 by Vicci Covert, RN Outcome: Progressing   Problem: Health Behavior/Discharge Planning: Goal: Ability to manage health-related needs will improve 02/12/2024 1324 by Vicci Covert, RN Outcome: Adequate for Discharge 02/12/2024 1324 by Vicci Covert, RN Outcome: Progressing   Problem: Clinical  Measurements: Goal: Ability to maintain clinical measurements within normal limits will improve 02/12/2024 1324 by Vicci Covert, RN Outcome: Adequate for Discharge 02/12/2024 1324 by Vicci Covert, RN Outcome: Progressing Goal: Will remain free from infection 02/12/2024 1324 by Vicci Covert, RN Outcome: Adequate for Discharge 02/12/2024 1324 by Vicci Covert, RN Outcome: Progressing Goal: Diagnostic test results will improve 02/12/2024 1324 by Quorra Rosene, RN Outcome: Adequate for  Discharge 02/12/2024 1324 by Reeve Mallo, RN Outcome: Progressing Goal: Respiratory complications will improve 02/12/2024 1324 by Lonzy Mato, RN Outcome: Adequate for Discharge 02/12/2024 1324 by Brenten Janney, RN Outcome: Progressing Goal: Cardiovascular complication will be avoided 02/12/2024 1324 by Abdiaziz Klahn, RN Outcome: Adequate for Discharge 02/12/2024 1324 by Vicci Covert, RN Outcome: Progressing   Problem: Activity: Goal: Risk for activity intolerance will decrease 02/12/2024 1324 by Vicci Covert, RN Outcome: Adequate for Discharge 02/12/2024 1324 by Vicci Covert, RN Outcome: Progressing   Problem: Nutrition: Goal: Adequate nutrition will be maintained 02/12/2024 1324 by Vicci Covert, RN Outcome: Adequate for Discharge 02/12/2024 1324 by Vicci Covert, RN Outcome: Progressing   Problem: Coping: Goal: Level of anxiety will decrease 02/12/2024 1324 by Vicci Covert, RN Outcome: Adequate for Discharge 02/12/2024 1324 by Vicci Covert, RN Outcome: Progressing   Problem: Elimination: Goal: Will not experience complications related to bowel motility 02/12/2024 1324 by Vicci Covert, RN Outcome: Adequate for Discharge 02/12/2024 1324 by Vicci Covert, RN Outcome: Progressing Goal: Will not experience complications related to urinary retention 02/12/2024 1324 by Vicci Covert, RN Outcome: Adequate for Discharge 02/12/2024 1324 by  Vicci Covert, RN Outcome: Progressing   Problem: Pain Managment: Goal: General experience of comfort will improve and/or be controlled 02/12/2024 1324 by Azelie Noguera, RN Outcome: Adequate for Discharge 02/12/2024 1324 by Vicci Covert, RN Outcome: Progressing   Problem: Safety: Goal: Ability to remain free from injury will improve 02/12/2024 1324 by Vicci Covert, RN Outcome: Adequate for Discharge 02/12/2024 1324 by Vicci Covert, RN Outcome: Progressing   Problem: Skin Integrity: Goal: Risk for impaired skin integrity will decrease 02/12/2024 1324 by Vicci Covert, RN Outcome: Adequate for Discharge 02/12/2024 1324 by Vicci Covert, RN Outcome: Progressing   "

## 2024-02-12 NOTE — Discharge Summary (Signed)
 "  Physician Discharge Summary   [name].  male DOB: [birth year]  FMW:[id number]  PCP: [facility]  Admit date: 02/05/2024 Discharge date: 02/12/2024  Admitted From: SNF LTC Disposition:  SNF LTC CODE STATUS: DNR  Discharge Instructions     Diet - low sodium heart healthy   Complete by: As directed    Discharge wound care:   Complete by: As directed    Cleanse B buttocks/posterior thighs/ischial wounds with Vashe, do not rinse. Apply Xeroform gauze (Lawson [phone]) to wound beds daily. Apply Gerhardt's Butt Cream to surrounding intact skin.  Apply silicone foam or ABD pads to area whichever is preferred. [facility] Course:  For full details, please see H&P, progress notes, consult notes and ancillary notes.  Briefly,  Bobby Amos. is a 89 y.o. Caucasian male with medical history significant for CAD s/p CABG (1989), PAD s/p right AKA, type 2 diabetes mellitus, hypertension, chronic 2L O2 use at baseline who presented to the ER with acute onset of worsening dyspnea with associated congested cough with inability to expectorate as well as wheezing, fever and chills.  He was diagnosed with pneumonia at his SNF and was given p.o. doxycycline     He was in significant respiratory distress upon arrival to ED that he had to be placed on BiPAP.    * Acute on chronic hypoxic respiratory failure (HCC) 2L O2 at baseline In the setting of pneumonia, CHF exacerbation.  Off BiPAP and back on home 2L O2.   Sepsis due to pneumonia [facility]) Presented with leukocytosis, tachycardia and tachypnea. --completed 5 days of ceftriaxone  and doxy --received hypertonic saline neb BID with DuoNeb for mucus clearance.   Acute diastolic congestive heart failure Echocardiogram from March 2025 showed EF 50 to 60%.  BNP elevated. Cardiology consulted.   --received IV Lasix  to 40 mg twice daily x7 doses, then held due to Cr increase.  Pt is discharged on oral Lasix  20 mg daily  per cardio rec. --switched from home Lopressor  12.5 mg BID to Toprol  25 mg daily.   Troponin elevation 2/2 demand ischemia Patient denies any chest discomfort.  Heparin  gtt stopped.   CKD stage IIIb: Baseline creatinine around 2.   Dyslipidemia Continue statin therapy.   Depression Continue Wellbutrin  XL   Essential hypertension --switched from home Lopressor  12.5 mg BID to Toprol  25 mg daily.  Type 2 diabetes mellitus with chronic kidney disease, with long-term current use of insulin  (HCC) --A1c 6.3, well controlled. --cont home insulin  regimen as below.   Obesity Class I: BMI 33.45.  PAD s/p right AKA --cont home ASA, plavix  and statin   Pressure Injury Ischial tuberosity Right Stage 2  Pressure Injury Ischial tuberosity Left;Posterior;Proximal Stage 3  Pressure Injury Buttocks Right;Left Stage 3  --wound care per instruction above.   Discharge Diagnoses:  Principal Problem:   Acute respiratory failure (HCC) Active Problems:   Sepsis with acute hypercapnic respiratory failure without septic shock (HCC)   Type 2 diabetes mellitus with chronic kidney disease, with long-term current use of insulin  (HCC)   Essential hypertension   COPD exacerbation (HCC)   Depression   Dyslipidemia   Acute diastolic CHF (congestive heart failure) (HCC)   Pneumonia due to infectious organism   Chest pain due to myocardial ischemia   Hx of CABG   Acute on chronic respiratory failure with hypoxia and hypercapnia (HCC)   Demand ischemia (HCC)   30 Day Unplanned Readmission  Risk Score    Flowsheet Row ED to Hosp-Admission (Current) from 02/05/2024 in [facility] (1A)  30 Day Unplanned Readmission Risk Score (%) 27.04 Filed at 02/12/2024 0801    This score is the patient's risk of an unplanned readmission within 30 days of being discharged (0 -100%). The score is based on dignosis, age, lab data, medications, orders, and past utilization.   Low:   0-14.9   Medium: 15-21.9   High: 22-29.9   Extreme: 30 and above         Discharge Instructions:  Allergies as of 02/12/2024       Reactions   Sulfonamide Derivatives    REACTION: pruitis patient cant remember its been so long    Aspirin  Rash   In high doses   Nitrofurantoin Rash   Penicillins Rash   Did it involve swelling of the face/tongue/throat, SOB, or low BP? No Did it involve sudden or severe rash/hives, skin peeling, or any reaction on the inside of your mouth or nose? No Did you need to seek medical attention at a hospital or doctor's office? No When did it last happen?      Teenager If all above answers are NO, may proceed with cephalosporin use.   Sulfa  Antibiotics Rash        Medication List     STOP taking these medications    Artificial Tears 0.2-0.2-1 % Soln Generic drug: Glycerin -Hypromellose-PEG 400   docusate sodium  100 MG capsule Commonly known as: COLACE   doxycycline  100 MG capsule Commonly known as: VIBRAMYCIN    Fleet Saline Enema 7-19 GM/197ML Enem   Generlac  10 GM/15ML Soln Generic drug: lactulose  (encephalopathy)   metoprolol  tartrate 25 MG tablet Commonly known as: LOPRESSOR    PRESERVISION AREDS 2 PO   senna 8.6 MG tablet Commonly known as: SENOKOT       TAKE these medications    acetaminophen  325 MG tablet Commonly known as: TYLENOL  Take 650 mg by mouth every 6 (six) hours as needed.   albuterol  108 (90 Base) MCG/ACT inhaler Commonly known as: VENTOLIN  HFA Inhale 2 puffs into the lungs every 6 (six) hours as needed for wheezing or shortness of breath.   Ashwagandha 300 MG Tabs Take 1 capsule by mouth at bedtime. Take 460 mg by mouth at bedtime.   aspirin  81 MG tablet Take 81 mg by mouth daily.   Basaglar  KwikPen 100 UNIT/ML Inject 10 Units into the skin at bedtime.   buPROPion  150 MG 24 hr tablet Commonly known as: WELLBUTRIN  XL Take 150 mg by mouth daily.   calcium -vitamin D  500-5 MG-MCG tablet Commonly  known as: OSCAL WITH D Take 1 tablet by mouth 2 (two) times daily.   carboxymethylcellulose 0.5 % Soln Commonly known as: REFRESH PLUS Place 1 drop into both eyes QID.   clopidogrel  75 MG tablet Commonly known as: Plavix  Take 1 tablet (75 mg total) by mouth daily.   Co Q-10 100 MG Caps Take 1 capsule by mouth daily.   diclofenac  Sodium 1 % Gel Commonly known as: VOLTAREN  Apply topically. APPLY 2 GRAMS TOPICALLY TO EACH SHOULDER EVERY 6 HOURS AS NEEDED   famotidine  10 MG tablet Commonly known as: PEPCID  Take 10 mg by mouth daily.   ferrous sulfate 300 (60 Fe) MG/5ML syrup Take 300 mg by mouth in the morning and at bedtime.   fluticasone  50 MCG/ACT nasal spray Commonly known as: FLONASE  Place 2 sprays into both nostrils daily.   furosemide  20 MG tablet  Commonly known as: Lasix  Take 1 tablet (20 mg total) by mouth daily.   Gerhardt's butt cream Crea Apply 1 Application topically 2 (two) times daily.   guaiFENesin  600 MG 12 hr tablet Commonly known as: MUCINEX  Take 600 mg by mouth every 12 (twelve) hours as needed.   insulin  lispro 100 UNIT/ML KwikPen Commonly known as: HUMALOG Inject subcutaneously as directed per sliding scale.   ipratropium-albuterol  0.5-2.5 (3) MG/3ML Soln Commonly known as: DUONEB 1 NEBULIZER SOLUTION ( ) BY ORAL INHALATION EVERY 4 TO 6 HOURS AS NEEDED   Januvia  25 MG tablet Generic drug: sitaGLIPtin  Take 25 mg by mouth daily.   lactulose  10 GM/15ML solution Commonly known as: CHRONULAC  Take 20 g by mouth 2 (two) times daily.   lidocaine  3 % Crea cream Commonly known as: LINDAMANTLE Apply 1 Application topically as needed. Apply to left shoulder every 6 hours as needed along with diclofenac  gel.   lidocaine  4 % Place 1 patch onto the skin daily. Apply topically to left chest. Remove at bedtime.   metoprolol  succinate 25 MG 24 hr tablet Commonly known as: TOPROL -XL Take 1 tablet (25 mg total) by mouth daily.   nystatin   powder Commonly known as: MYCOSTATIN /NYSTOP  Apply topically 2 (two) times daily. Apply to groin and perineum twice a day What changed:  when to take this additional instructions   polyethylene glycol powder 17 GM/SCOOP powder Commonly known as: GLYCOLAX /MIRALAX  Take 17 g by mouth daily.   pregabalin  50 MG capsule Commonly known as: LYRICA  Take 50 mg by mouth 2 (two) times daily. What changed: Another medication with the same name was removed. Continue taking this medication, and follow the directions you see here.   rosuvastatin  10 MG tablet Commonly known as: CRESTOR  Take 10 mg by mouth at bedtime.   senna-docusate 8.6-50 MG tablet Commonly known as: Senokot-S Take 1 tablet by mouth at bedtime as needed for mild constipation.   simethicone  80 MG chewable tablet Commonly known as: Gas-X Chew 1 tablet (80 mg total) by mouth 4 (four) times daily as needed for flatulence.   Trelegy Ellipta  100-62.5-25 MCG/ACT Aepb Generic drug: Fluticasone -Umeclidin-Vilant Inhale 1 puff into the lungs daily.   Vabysmo 6 MG/0.05ML Sosy intravitreal injection Generic drug: faricimab-svoa 6 mg by Intravitreal route 3 (three) times daily. Administered by the [facility] 3 times a day on Friday every 5 weeks.               Discharge Care Instructions  (From admission, onward)           Start     Ordered   02/12/24 0000  Discharge wound care:       Comments: Cleanse B buttocks/posterior thighs/ischial wounds with Vashe, do not rinse. Apply Xeroform gauze (Lawson [phone]) to wound beds daily. Apply Gerhardt's Butt Cream to surrounding intact skin.  Apply silicone foam or ABD pads to area whichever is preferred. - -   02/12/24 1049             Follow-up Information     [facility].   Specialty: General Practice Why: hospital follow up Contact information: [address] [place] [address] [phone]                 Allergies[1]   The results  of significant diagnostics from this hospitalization (including imaging, microbiology, ancillary and laboratory) are listed below for reference.   Consultations:   Procedures/Studies: ECHOCARDIOGRAM COMPLETE Result Date: 02/09/2024    ECHOCARDIOGRAM REPORT  Patient Name:   [name]. Date of Exam: 02/09/2024 Medical Rec #:  [id number]           Height:       68.0 in Accession #:    [id number]          Weight:       219.8 lb Date of Birth:  November 11, 1930           BSA:          2.127 m Patient Age:    89 years            BP:           116/69 mmHg Patient Gender: M                   HR:           90 bpm. Exam Location:  [facility] Procedure: 2D Echo, Cardiac Doppler, Color Doppler and Intracardiac            Opacification Agent (Both Spectral and Color Flow Doppler were            utilized during procedure). Indications:     Chest Pain  History:         Patient has prior history of Echocardiogram examinations, most                  recent 05/14/2023. CHF, CAD, Prior CABG, COPD; Risk                  Factors:Hypertension, Diabetes and Dyslipidemia. CKD.  Sonographer:     Philomena Daring Referring Phys:  [id number] RYAN M DUNN Diagnosing Phys: Lonni Hanson MD  Sonographer Comments: Technically challenging study due to limited acoustic windows, Technically difficult study due to poor echo windows, suboptimal apical window and patient is obese. Image acquisition challenging due to patient body habitus. IMPRESSIONS  1. Left ventricular ejection fraction, by estimation, is >55%. The left ventricle has normal function. Left ventricular endocardial border not optimally defined to evaluate regional wall motion. Left ventricular diastolic parameters are consistent with Grade I diastolic dysfunction (impaired relaxation).  2. Right ventricular systolic function is mildly reduced. The right ventricular size is mildly enlarged. Tricuspid regurgitation signal is inadequate for assessing PA pressure.  3. The mitral valve was not  well visualized. Trivial mitral valve regurgitation.  4. The aortic valve was not well visualized. Aortic valve regurgitation is trivial. Aortic valve gradient could not be assessed due to poor apical windows.  5. There is borderline dilatation of the ascending aorta, measuring 35 mm. FINDINGS  Left Ventricle: Left ventricular ejection fraction, by estimation, is >55%. The left ventricle has normal function. Left ventricular endocardial border not optimally defined to evaluate regional wall motion. Definity  contrast agent was given IV to delineate the left ventricular endocardial borders. The left ventricular internal cavity size was normal in size. There is borderline left ventricular hypertrophy. Left ventricular diastolic parameters are consistent with Grade I diastolic dysfunction (impaired relaxation). Right Ventricle: The right ventricular size is mildly enlarged. No increase in right ventricular wall thickness. Right ventricular systolic function is mildly reduced. Tricuspid regurgitation signal is inadequate for assessing PA pressure. Left Atrium: Left atrial size was not well visualized. Right Atrium: Right atrial size was not well visualized. Pericardium: The pericardium was not well visualized. Presence of epicardial fat layer. Mitral Valve: The mitral valve was not well visualized. Mild mitral annular calcification. Trivial mitral valve regurgitation. Tricuspid Valve: The tricuspid  valve is not well visualized. Tricuspid valve regurgitation is not demonstrated. Aortic Valve: The aortic valve was not well visualized. Aortic valve regurgitation is trivial. Aortic valve gradient could not be assessed due to poor apical windows. Pulmonic Valve: The pulmonic valve was not well visualized. Pulmonic valve regurgitation is not visualized. No evidence of pulmonic stenosis. Aorta: The aortic root is normal in size and structure. There is borderline dilatation of the ascending aorta, measuring 35 mm. Pulmonary  Artery: The pulmonary artery is not well seen. Venous: The inferior vena cava was not well visualized. IAS/Shunts: The interatrial septum was not well visualized.  LEFT VENTRICLE PLAX 2D LVIDd:         4.10 cm   Diastology LVIDs:         2.60 cm   LV e' medial:    6.74 cm/s LV PW:         1.10 cm   LV E/e' medial:  6.7 LV IVS:        0.80 cm   LV e' lateral:   6.85 cm/s LVOT diam:     2.10 cm   LV E/e' lateral: 6.6 LVOT Area:     3.46 cm  RIGHT VENTRICLE RV S prime:     13.30 cm/s TAPSE (M-mode): 1.2 cm  AORTA Ao Root diam: 2.50 cm MITRAL VALVE MV Area (PHT): 4.41 cm    SHUNTS MV Decel Time: 172 msec    Systemic Diam: 2.10 cm MV E velocity: 44.90 cm/s MV A velocity: 77.20 cm/s MV E/A ratio:  0.58 Lonni End MD Electronically signed by Lonni Hanson MD Signature Date/Time: 02/09/2024/5:44:46 PM    Final    DG Chest Port 1 View if patient is in a treatment room. Result Date: 02/05/2024 EXAM: 1 VIEW(S) XRAY OF THE CHEST 02/05/2024 05:50:00 PM COMPARISON: 02/06/2023 CLINICAL HISTORY: Suspected Sepsis FINDINGS: LUNGS AND PLEURA: Low lung volumes with vascular crowding. Left retrocardiac opacity, likely atelectasis. Left pleural effusion. No pneumothorax. HEART AND MEDIASTINUM: Stable cardiomegaly. Aortic atherosclerotic calcification. Status post CABG. BONES AND SOFT TISSUES: Status post median sternotomy. IMPRESSION: 1. Left retrocardiac opacity, likely atelectasis, with small left pleural effusion. 2. Stable cardiomegaly. Electronically signed by: Pinkie Pebbles MD 02/05/2024 06:43 PM EST RP Workstation: [id number]      Labs: BNP (last 3 results) No results for input(s): BNP in the last 8760 hours. Basic Metabolic Panel: Recent Labs  Lab 02/07/24 1017 02/08/24 1008 02/09/24 0413 02/10/24 0359 02/11/24 0612 02/12/24 0520  NA 139 138 136 138 141  --   K 3.3* 4.1 3.9 3.9 4.0  --   CL 103 100 98 96* 96*  --   CO2 27 29 33* 35* 34*  --   GLUCOSE 136* 163* 97 106* 93  --   BUN 40* 37*  35* 34* 33*  --   CREATININE 1.84* 1.89* 1.92* 2.00* 2.11* 2.15*  CALCIUM  8.6* 8.7* 8.8* 9.2 9.5  --   MG  --   --   --   --  2.0  --   PHOS  --   --   --   --  3.0  --    Liver Function Tests: Recent Labs  Lab 02/05/24 1741 02/11/24 0612  AST 15  --   ALT 6  --   ALKPHOS 75  --   BILITOT 0.3  --   PROT 5.8*  --   ALBUMIN 3.1* 3.2*   No results for input(s): LIPASE, AMYLASE in the last 168 hours. No  results for input(s): AMMONIA in the last 168 hours. CBC: Recent Labs  Lab 02/05/24 1741 02/06/24 0339 02/07/24 1747 02/08/24 0349 02/09/24 0413 02/10/24 0359  WBC 14.1* 13.3* 12.0* 13.1* 10.0 11.1*  NEUTROABS 10.2*  --   --   --   --   --   HGB 10.5* 9.5* 10.1* 10.2* 10.5* 11.9*  HCT 34.5* 30.9* 31.3* 31.0* 32.7* 36.8*  MCV 98.0 98.4 94.0 92.8 94.2 94.4  PLT 136* 126* 179 166 173 152   Cardiac Enzymes: No results for input(s): CKTOTAL, CKMB, CKMBINDEX, TROPONINI in the last 168 hours. BNP: Invalid input(s): POCBNP CBG: Recent Labs  Lab 02/11/24 0754 02/11/24 1222 02/11/24 1610 02/11/24 2135 02/12/24 0932  GLUCAP 100* 112* 142* 104* 100*   D-Dimer No results for input(s): DDIMER in the last 72 hours. Hgb A1c No results for input(s): HGBA1C in the last 72 hours. Lipid Profile No results for input(s): CHOL, HDL, LDLCALC, TRIG, CHOLHDL, LDLDIRECT in the last 72 hours. Thyroid  function studies No results for input(s): TSH, T4TOTAL, T3FREE, THYROIDAB in the last 72 hours.  Invalid input(s): FREET3 Anemia work up No results for input(s): VITAMINB12, FOLATE, FERRITIN, TIBC, IRON, RETICCTPCT in the last 72 hours. Urinalysis    Component Value Date/Time   COLORURINE STRAW (A) 02/06/2024 1545   APPEARANCEUR CLEAR (A) 02/06/2024 1545   APPEARANCEUR Clear 03/31/2018 1134   LABSPEC 1.009 02/06/2024 1545   PHURINE 5.0 02/06/2024 1545   GLUCOSEU NEGATIVE 02/06/2024 1545   HGBUR NEGATIVE 02/06/2024 1545    BILIRUBINUR NEGATIVE 02/06/2024 1545   BILIRUBINUR Negative 03/31/2018 1134   KETONESUR NEGATIVE 02/06/2024 1545   PROTEINUR NEGATIVE 02/06/2024 1545   UROBILINOGEN negative 11/16/2014 1132   UROBILINOGEN 0.2 01/19/2013 1547   NITRITE NEGATIVE 02/06/2024 1545   LEUKOCYTESUR NEGATIVE 02/06/2024 1545   Sepsis Labs Recent Labs  Lab 02/07/24 1747 02/08/24 0349 02/09/24 0413 02/10/24 0359  WBC 12.0* 13.1* 10.0 11.1*   Microbiology Recent Results (from the past 240 hours)  Resp panel by RT-PCR (RSV, Flu A&B, Covid) Anterior Nasal Swab     Status: None   Collection Time: 02/05/24  5:42 PM   Specimen: Anterior Nasal Swab  Result Value Ref Range Status   SARS Coronavirus 2 by RT PCR NEGATIVE NEGATIVE Final    Comment: (NOTE) SARS-CoV-2 target nucleic acids are NOT DETECTED.  The SARS-CoV-2 RNA is generally detectable in upper respiratory specimens during the acute phase of infection. The lowest concentration of SARS-CoV-2 viral copies this assay can detect is 138 copies/mL. A negative result does not preclude SARS-Cov-2 infection and should not be used as the sole basis for treatment or other patient management decisions. A negative result may occur with  improper specimen collection/handling, submission of specimen other than nasopharyngeal swab, presence of viral mutation(s) within the areas targeted by this assay, and inadequate number of viral copies(<138 copies/mL). A negative result must be combined with clinical observations, patient history, and epidemiological information. The expected result is Negative.  Fact Sheet for Patients:  bloggercourse.com  Fact Sheet for Healthcare Providers:  seriousbroker.it  This test is no t yet approved or cleared by the [place]  FDA and  has been authorized for detection and/or diagnosis of SARS-CoV-2 by FDA under an Emergency Use Authorization (EUA). This EUA will remain  in  effect (meaning this test can be used) for the duration of the COVID-19 declaration under Section 564(b)(1) of the Act, 21 U.S.C.section 360bbb-3(b)(1), unless the authorization is terminated  or revoked sooner.  Influenza A by PCR NEGATIVE NEGATIVE Final   Influenza B by PCR NEGATIVE NEGATIVE Final    Comment: (NOTE) The Xpert Xpress SARS-CoV-2/FLU/RSV plus assay is intended as an aid in the diagnosis of influenza from Nasopharyngeal swab specimens and should not be used as a sole basis for treatment. Nasal washings and aspirates are unacceptable for Xpert Xpress SARS-CoV-2/FLU/RSV testing.  Fact Sheet for Patients: bloggercourse.com  Fact Sheet for Healthcare Providers: seriousbroker.it  This test is not yet approved or cleared by the [place]  FDA and has been authorized for detection and/or diagnosis of SARS-CoV-2 by FDA under an Emergency Use Authorization (EUA). This EUA will remain in effect (meaning this test can be used) for the duration of the COVID-19 declaration under Section 564(b)(1) of the Act, 21 U.S.C. section 360bbb-3(b)(1), unless the authorization is terminated or revoked.     Resp Syncytial Virus by PCR NEGATIVE NEGATIVE Final    Comment: (NOTE) Fact Sheet for Patients: bloggercourse.com  Fact Sheet for Healthcare Providers: seriousbroker.it  This test is not yet approved or cleared by the [place]  FDA and has been authorized for detection and/or diagnosis of SARS-CoV-2 by FDA under an Emergency Use Authorization (EUA). This EUA will remain in effect (meaning this test can be used) for the duration of the COVID-19 declaration under Section 564(b)(1) of the Act, 21 U.S.C. section 360bbb-3(b)(1), unless the authorization is terminated or revoked.  Performed at [facility], [address] [place], [place] [address]   Culture,  blood (Routine x 2)     Status: Abnormal   Collection Time: 02/05/24  5:54 PM   Specimen: BLOOD  Result Value Ref Range Status   Specimen Description   Final    BLOOD BLOOD RIGHT HAND Performed at [facility], [address], [place], [place] [address]    Special Requests   Final    BOTTLES DRAWN AEROBIC AND ANAEROBIC Blood Culture adequate volume Performed at [facility], [address] [place], [place] [address]    Culture  Setup Time   Final    GRAM POSITIVE COCCI AEROBIC BOTTLE ONLY CRITICAL RESULT CALLED TO, READ BACK BY AND VERIFIED WITH: NATHAN B., PHARM D AT [id number] 02/07/24 RAM GRAM STAIN REVIEWED-AGREE WITH RESULT    Culture (A)  Final    STAPHYLOCOCCUS HOMINIS THE SIGNIFICANCE OF ISOLATING THIS ORGANISM FROM A SINGLE SET OF BLOOD CULTURES WHEN MULTIPLE SETS ARE DRAWN IS UNCERTAIN. PLEASE NOTIFY THE MICROBIOLOGY DEPARTMENT WITHIN ONE WEEK IF SPECIATION AND SENSITIVITIES ARE REQUIRED. Performed at [facility], [address], [place], [place] [address]    Report Status 02/08/2024 FINAL  Final  Culture, blood (Routine x 2)     Status: None   Collection Time: 02/05/24  5:54 PM   Specimen: BLOOD  Result Value Ref Range Status   Specimen Description BLOOD BLOOD RIGHT ARM  Final   Special Requests   Final    BOTTLES DRAWN AEROBIC AND ANAEROBIC Blood Culture adequate volume   Culture   Final    NO GROWTH 5 DAYS Performed at [facility], [address] [place], [place] [address]    Report Status 02/10/2024 FINAL  Final  Blood Culture ID Panel (Reflexed)     Status: Abnormal   Collection Time: 02/05/24  5:54 PM  Result Value Ref Range Status   Enterococcus faecalis NOT DETECTED NOT DETECTED Final   Enterococcus Faecium NOT DETECTED NOT DETECTED Final   Listeria monocytogenes NOT DETECTED NOT DETECTED Final  Staphylococcus species DETECTED (A) NOT DETECTED Final    Comment: CRITICAL RESULT CALLED TO, READ BACK BY AND VERIFIED  WITH: RANKIN WENDI GOWER D AT 0628 02/07/24 RAM    Staphylococcus aureus (BCID) NOT DETECTED NOT DETECTED Final   Staphylococcus epidermidis NOT DETECTED NOT DETECTED Final   Staphylococcus lugdunensis NOT DETECTED NOT DETECTED Final   Streptococcus species NOT DETECTED NOT DETECTED Final   Streptococcus agalactiae NOT DETECTED NOT DETECTED Final   Streptococcus pneumoniae NOT DETECTED NOT DETECTED Final   Streptococcus pyogenes NOT DETECTED NOT DETECTED Final   A.calcoaceticus-baumannii NOT DETECTED NOT DETECTED Final   Bacteroides fragilis NOT DETECTED NOT DETECTED Final   Enterobacterales NOT DETECTED NOT DETECTED Final   Enterobacter cloacae complex NOT DETECTED NOT DETECTED Final   Escherichia coli NOT DETECTED NOT DETECTED Final   Klebsiella aerogenes NOT DETECTED NOT DETECTED Final   Klebsiella oxytoca NOT DETECTED NOT DETECTED Final   Klebsiella pneumoniae NOT DETECTED NOT DETECTED Final   Proteus species NOT DETECTED NOT DETECTED Final   Salmonella species NOT DETECTED NOT DETECTED Final   Serratia marcescens NOT DETECTED NOT DETECTED Final   Haemophilus influenzae NOT DETECTED NOT DETECTED Final   Neisseria meningitidis NOT DETECTED NOT DETECTED Final   Pseudomonas aeruginosa NOT DETECTED NOT DETECTED Final   Stenotrophomonas maltophilia NOT DETECTED NOT DETECTED Final   Candida albicans NOT DETECTED NOT DETECTED Final   Candida auris NOT DETECTED NOT DETECTED Final   Candida glabrata NOT DETECTED NOT DETECTED Final   Candida krusei NOT DETECTED NOT DETECTED Final   Candida parapsilosis NOT DETECTED NOT DETECTED Final   Candida tropicalis NOT DETECTED NOT DETECTED Final   Cryptococcus neoformans/gattii NOT DETECTED NOT DETECTED Final    Comment: Performed at [facility], [address] [place], [place] [address]  MRSA Next Gen by PCR, Nasal     Status: Abnormal   Collection Time: 02/06/24  4:17 PM   Specimen: Nasal Mucosa; Nasal Swab  Result Value Ref Range  Status   MRSA by PCR Next Gen DETECTED (A) NOT DETECTED Final    Comment: RESULT CALLED TO, READ BACK BY AND VERIFIED WITH: BYERS, JASON @ 02/06/2024 1924 AB (NOTE) The GeneXpert MRSA Assay (FDA approved for NASAL specimens only), is one component of a comprehensive MRSA colonization surveillance program. It is not intended to diagnose MRSA infection nor to guide or monitor treatment for MRSA infections. Test performance is not FDA approved in patients less than 62 years old. Performed at [facility], [address] [place], [place] [address]      Total time spend on discharging this patient, including the last patient exam, discussing the hospital stay, instructions for ongoing care as it relates to all pertinent caregivers, as well as preparing the medical discharge records, prescriptions, and/or referrals as applicable, is 30 minutes.    Ellouise Haber, MD  [facility] 02/12/2024, 10:49 AM       [1]  Allergies Allergen Reactions   Sulfonamide Derivatives     REACTION: pruitis patient cant remember its been so long     Aspirin  Rash    In high doses   Nitrofurantoin Rash   Penicillins Rash    Did it involve swelling of the face/tongue/throat, SOB, or low BP? No Did it involve sudden or severe rash/hives, skin peeling, or any reaction on the inside of your mouth or nose? No Did you need to seek medical attention at a hospital or doctor's office? No When did it last happen?  Teenager If all above answers are NO, may proceed with cephalosporin use.   Sulfa  Antibiotics Rash   "

## 2024-02-12 NOTE — TOC Transition Note (Signed)
 Transition of Care [facility]) - Discharge Note   Patient Details  Name: [name]. MRN: [id number] Date of Birth: 1930/05/24  [facility] CM/SW Contact:  Alvaro Louder, LCSW Phone Number: 02/12/2024, 2:12 PM   Clinical Narrative:     LCSWA received insurance approval for patient to admit to SNF [facility]. LCSWA confirmed with MD that patient is stable for discharge. LCSWA notified the patient and son and they are in agreement with discharge . LCSWA confirmed bed is available at [facility] Transport arranged with [facility] for next available.    Room number: 609, Report number: [phone]   TOC signing off  Final next level of care: Skilled Nursing Facility Barriers to Discharge: No Barriers Identified   Patient Goals and CMS Choice            Discharge Placement              Patient chooses bed at: [facility] Patient to be transferred to facility by: [facility] Name of family member notified: Self/Son Patient and family notified of of transfer: 02/12/24  Discharge Plan and Services Additional resources added to the After Visit Summary for                                       Social Drivers of Health (SDOH) Interventions SDOH Screenings   Food Insecurity: Unknown (02/06/2024)  Housing: Patient Declined (02/06/2024)  Transportation Needs: Patient Declined (02/06/2024)  Utilities: Patient Declined (02/06/2024)  Social Connections: Socially Isolated (02/06/2024)  Tobacco Use: Medium Risk (02/06/2024)     Readmission Risk Interventions    02/10/2024    3:58 PM 05/14/2023   12:33 PM  Readmission Risk Prevention Plan  Transportation Screening Complete Complete  PCP or Specialist Appt within 3-5 Days Complete Complete  HRI or Home Care Consult Complete   Social Work Consult for Recovery Care Planning/Counseling Complete Complete  Palliative Care Screening Not Applicable Not Applicable  Medication Review Furniture Conservator/restorer) Complete Complete

## 2024-02-12 NOTE — Plan of Care (Signed)
" °  Problem: Clinical Measurements: Goal: Signs and symptoms of infection will decrease Outcome: Progressing   Problem: Respiratory: Goal: Ability to maintain adequate ventilation will improve Outcome: Progressing   Problem: Coping: Goal: Ability to adjust to condition or change in health will improve Outcome: Progressing   Problem: Skin Integrity: Goal: Risk for impaired skin integrity will decrease Outcome: Progressing   Problem: Activity: Goal: Risk for activity intolerance will decrease Outcome: Progressing   Problem: Coping: Goal: Level of anxiety will decrease Outcome: Progressing   Problem: Elimination: Goal: Will not experience complications related to bowel motility Outcome: Progressing Goal: Will not experience complications related to urinary retention Outcome: Progressing   "

## 2024-02-12 NOTE — Plan of Care (Signed)

## 2024-02-19 ENCOUNTER — Other Ambulatory Visit: Payer: Self-pay

## 2024-02-19 ENCOUNTER — Emergency Department

## 2024-02-19 ENCOUNTER — Emergency Department
Admission: EM | Admit: 2024-02-19 | Discharge: 2024-02-20 | Disposition: A | Discharge status: Skilled Nursing Facility | Attending: Emergency Medicine | Admitting: Emergency Medicine

## 2024-02-19 DIAGNOSIS — R4182 Altered mental status, unspecified: Secondary | ICD-10-CM | POA: Diagnosis not present

## 2024-02-19 DIAGNOSIS — D72829 Elevated white blood cell count, unspecified: Secondary | ICD-10-CM | POA: Diagnosis not present

## 2024-02-19 DIAGNOSIS — N390 Urinary tract infection, site not specified: Secondary | ICD-10-CM | POA: Insufficient documentation

## 2024-02-19 DIAGNOSIS — R410 Disorientation, unspecified: Secondary | ICD-10-CM | POA: Diagnosis present

## 2024-02-19 LAB — COMPREHENSIVE METABOLIC PANEL WITH GFR
ALT: 6 U/L (ref 0–44)
AST: 12 U/L — ABNORMAL LOW (ref 15–41)
Albumin: 2.8 g/dL — ABNORMAL LOW (ref 3.5–5.0)
Alkaline Phosphatase: 73 U/L (ref 38–126)
Anion gap: 8 (ref 5–15)
BUN: 30 mg/dL — ABNORMAL HIGH (ref 8–23)
CO2: 31 mmol/L (ref 22–32)
Calcium: 8.1 mg/dL — ABNORMAL LOW (ref 8.9–10.3)
Chloride: 96 mmol/L — ABNORMAL LOW (ref 98–111)
Creatinine, Ser: 2.13 mg/dL — ABNORMAL HIGH (ref 0.61–1.24)
GFR, Estimated: 29 mL/min — ABNORMAL LOW
Glucose, Bld: 190 mg/dL — ABNORMAL HIGH (ref 70–99)
Potassium: 3.5 mmol/L (ref 3.5–5.1)
Sodium: 135 mmol/L (ref 135–145)
Total Bilirubin: 0.3 mg/dL (ref 0.0–1.2)
Total Protein: 5.6 g/dL — ABNORMAL LOW (ref 6.5–8.1)

## 2024-02-19 LAB — RESP PANEL BY RT-PCR (RSV, FLU A&B, COVID)  RVPGX2
Influenza A by PCR: NEGATIVE
Influenza B by PCR: NEGATIVE
Resp Syncytial Virus by PCR: NEGATIVE
SARS Coronavirus 2 by RT PCR: NEGATIVE

## 2024-02-19 LAB — URINALYSIS, ROUTINE W REFLEX MICROSCOPIC
Bilirubin Urine: NEGATIVE
Glucose, UA: NEGATIVE mg/dL
Hgb urine dipstick: NEGATIVE
Ketones, ur: NEGATIVE mg/dL
Nitrite: NEGATIVE
Protein, ur: NEGATIVE mg/dL
Specific Gravity, Urine: 1.013 (ref 1.005–1.030)
pH: 5 (ref 5.0–8.0)

## 2024-02-19 LAB — CBC
HCT: 31.1 % — ABNORMAL LOW (ref 39.0–52.0)
Hemoglobin: 9.7 g/dL — ABNORMAL LOW (ref 13.0–17.0)
MCH: 30.3 pg (ref 26.0–34.0)
MCHC: 31.2 g/dL (ref 30.0–36.0)
MCV: 97.2 fL (ref 80.0–100.0)
Platelets: 201 K/uL (ref 150–400)
RBC: 3.2 MIL/uL — ABNORMAL LOW (ref 4.22–5.81)
RDW: 14.6 % (ref 11.5–15.5)
WBC: 13.2 K/uL — ABNORMAL HIGH (ref 4.0–10.5)
nRBC: 0 % (ref 0.0–0.2)

## 2024-02-19 MED ORDER — SODIUM CHLORIDE 0.9 % IV SOLN
1.0000 g | Freq: Once | INTRAVENOUS | Status: AC
  Administered 2024-02-20: 1 g via INTRAVENOUS
  Filled 2024-02-19: qty 10

## 2024-02-19 NOTE — ED Provider Notes (Signed)
 "  [facility] Provider Note    Event Date/Time   First MD Initiated Contact with Patient 02/19/24 2145     (approximate)   History   UTI   HPI  [name]. is a 89 y.o. male presents to the emergency department today companied by family because of concerns for increased confusion and possible urinary tract infection.  History is primarily obtained from family at bedside.  They state that they noticed the patient was little bit more altered over the past few days.  They have noticed this behavior when he has had urinary tract infections in the past.  He was recently admitted and discharged from the hospital about a week ago for pneumonia.  Family states that he has continued to have a productive cough since then.  The patient himself is not complaining of any specific symptoms.     Physical Exam   Triage Vital Signs: ED Triage Vitals  Encounter Vitals Group     BP 02/19/24 1911 (!) 131/50     Girls Systolic BP Percentile --      Girls Diastolic BP Percentile --      Boys Systolic BP Percentile --      Boys Diastolic BP Percentile --      Pulse Rate 02/19/24 1911 67     Resp 02/19/24 1911 17     Temp 02/19/24 1911 98.1 F (36.7 C)     Temp Source 02/19/24 1911 Oral     SpO2 02/19/24 1911 92 %     Weight --      Height 02/19/24 1911 5' 8 (1.727 m)     Head Circumference --      Peak Flow --      Pain Score 02/19/24 1916 0     Pain Loc --      Pain Education --      Exclude from Growth Chart --     Most recent vital signs: Vitals:   02/19/24 1911  BP: (!) 131/50  Pulse: 67  Resp: 17  Temp: 98.1 F (36.7 C)  SpO2: 92%   General: Awake, alert, oriented. CV:  Good peripheral perfusion. Regular rate and rhythm. Resp:  Normal effort. Lungs clear. Abd:  No distention.    ED Results / Procedures / Treatments   Labs (all labs ordered are listed, but only abnormal results are displayed) Labs Reviewed  COMPREHENSIVE METABOLIC PANEL  WITH GFR - Abnormal; Notable for the following components:      Result Value   Chloride 96 (*)    Glucose, Bld 190 (*)    BUN 30 (*)    Creatinine, Ser 2.13 (*)    Calcium  8.1 (*)    Total Protein 5.6 (*)    Albumin 2.8 (*)    AST 12 (*)    GFR, Estimated 29 (*)    All other components within normal limits  CBC - Abnormal; Notable for the following components:   WBC 13.2 (*)    RBC 3.20 (*)    Hemoglobin 9.7 (*)    HCT 31.1 (*)    All other components within normal limits  URINALYSIS, ROUTINE W REFLEX MICROSCOPIC     EKG  None   RADIOLOGY I independently interpreted and visualized the CXR. My interpretation: Bilateral airspace opacities Radiology interpretation:  IMPRESSION:  1. Patchy retrocardiac airspace opacity, atelectasis versus infiltrate.  2. Possible small left pleural effusion.  3. Low lung volumes.  4. Status post coronary  artery bypass grafting.      PROCEDURES:  Critical Care performed: No    MEDICATIONS ORDERED IN ED: Medications - No data to display   IMPRESSION / MDM / ASSESSMENT AND PLAN / ED COURSE  I reviewed the triage vital signs and the nursing notes.                              Differential diagnosis includes, but is not limited to, UTI, pneumonia, anemia, electrolyte abnormality  Patient's presentation is most consistent with acute presentation with potential threat to life or bodily function.   Patient presented to the emergency department today because of concerns for altered mental status and possible urinary tract infection.  On exam patient is awake and alert.  He is not completely oriented to why he is here.  Blood work does show a leukocytosis.  Chest x-ray continues to do so retrocardiac opacity.  This time would have potentially some concern for continued pneumonia although he was treated for it recently.  Will add a procalcitonin.  Additionally will check UA.    UA is consistent with infection.  I discussed this with  family.  Did recommend admission for IV antibiotics however family would rather patient be discharged back to his facility.  At this time I do not feel patient is septic.  I do think this can be tried.  Will give dose of IV antibiotics here.  Will write for IM antibiotics however will also give prescription for oral antibiotics depending on capability of his living facility.  Family is aware that he might have to return if outpatient treatment is not sufficient.   FINAL CLINICAL IMPRESSION(S) / ED DIAGNOSES   Final diagnoses:  Altered mental status, unspecified altered mental status type  Leukocytosis, unspecified type  Lower urinary tract infection       Note:  This document was prepared using Dragon voice recognition software and may include unintentional dictation errors.    Floy Roberts, MD 02/20/24 [phone]  "

## 2024-02-19 NOTE — ED Triage Notes (Signed)
 Pt is coming in for suspected UTI, he was released 1 week ago from [facility] for pneumonia. His son reports he gets loppy when he does have a UTI nad this has been an issue in the past. Pt is GCS 15 in triage, but is reportedly incontinent. No expressions of pain and pt is otherwise stable at this time in a stretcher, he is non ambulatory

## 2024-02-19 NOTE — ED Notes (Signed)
 Fall risk bundle is currently in place.

## 2024-02-19 NOTE — ED Notes (Signed)
 Sent Covid/Flu swab to lab

## 2024-02-20 MED ORDER — CEFTRIAXONE SODIUM 1 G IJ SOLR: 1.0000 g | INTRAMUSCULAR | 0 refills | Status: AC

## 2024-02-20 MED ORDER — CEPHALEXIN 500 MG PO CAPS: 500.0000 mg | ORAL_CAPSULE | Freq: Three times a day (TID) | ORAL | 0 refills | Status: DC

## 2024-02-20 MED ORDER — CEPHALEXIN 500 MG PO CAPS: 500.0000 mg | ORAL_CAPSULE | Freq: Three times a day (TID) | ORAL | 0 refills | Status: AC

## 2024-02-20 NOTE — ED Provider Notes (Signed)
 Received call from nurse at facility stating they were unable to access the electronically prescribed antibiotic.  Request written prescription be faxed to them.  Seen by Dr. Floy on last shift.  Treating for UTI.  Had prescribed ceftriaxone  IM and oral Keflex  stating either could be given depending on patient's ability to take p.o.  They state patient is mentating well and is able to take pills.  Will proceed with planned Keflex .  Patient already received a dose of IV ceftriaxone  here in the emergency department.  Written prescription printed and signed.  Will send to provided fax number: [phone]   Clarine Ozell LABOR, MD 02/20/24 [phone]

## 2024-02-20 NOTE — ED Notes (Signed)
 [facility] is here now to transport patient back home

## 2024-02-20 NOTE — ED Notes (Signed)
 Spoke with [name] with [facility] at 12:25am for transport for patient, to [facility]. Patient has 1 other patient ahead of him

## 2024-02-20 NOTE — Discharge Instructions (Addendum)
 If possible please give the Rocephin  IM.  If unable to give IM medications please then administer the Keflex  orally. Please have [name] return to the emergency department for any worsening confusion, high fevers, persistent vomiting or any other new or concerning symptoms.
